# Patient Record
Sex: Female | Born: 1950 | Race: Black or African American | Hispanic: No | Smoking: Never smoker
Health system: Southern US, Community
[De-identification: ages and names within clinical notes are randomized; demographics above are authoritative.]

## PROBLEM LIST (undated history)

## (undated) DIAGNOSIS — I10 Essential (primary) hypertension: Secondary | ICD-10-CM

## (undated) DIAGNOSIS — E042 Nontoxic multinodular goiter: Secondary | ICD-10-CM

## (undated) DIAGNOSIS — H409 Unspecified glaucoma: Secondary | ICD-10-CM

## (undated) DIAGNOSIS — E785 Hyperlipidemia, unspecified: Secondary | ICD-10-CM

## (undated) DIAGNOSIS — R7302 Impaired glucose tolerance (oral): Secondary | ICD-10-CM

## (undated) HISTORY — DX: Nontoxic multinodular goiter: E04.2

## (undated) HISTORY — DX: Unspecified glaucoma: H40.9

## (undated) HISTORY — DX: Essential (primary) hypertension: I10

## (undated) HISTORY — DX: Hyperlipidemia, unspecified: E78.5

## (undated) HISTORY — DX: Impaired glucose tolerance (oral): R73.02

---

## 1988-05-29 DIAGNOSIS — I10 Essential (primary) hypertension: Secondary | ICD-10-CM

## 1988-05-29 HISTORY — DX: Essential (primary) hypertension: I10

## 2000-11-09 ENCOUNTER — Ambulatory Visit (HOSPITAL_COMMUNITY): Admission: RE | Admit: 2000-11-09 | Discharge: 2000-11-09 | Payer: Self-pay | Admitting: Family Medicine

## 2000-11-09 ENCOUNTER — Encounter: Payer: Self-pay | Admitting: Family Medicine

## 2000-12-19 ENCOUNTER — Ambulatory Visit (HOSPITAL_COMMUNITY): Admission: RE | Admit: 2000-12-19 | Discharge: 2000-12-19 | Payer: Self-pay | Admitting: General Surgery

## 2001-11-21 ENCOUNTER — Encounter: Payer: Self-pay | Admitting: Family Medicine

## 2001-11-21 ENCOUNTER — Ambulatory Visit (HOSPITAL_COMMUNITY): Admission: RE | Admit: 2001-11-21 | Discharge: 2001-11-21 | Payer: Self-pay | Admitting: Family Medicine

## 2002-12-03 ENCOUNTER — Ambulatory Visit (HOSPITAL_COMMUNITY): Admission: RE | Admit: 2002-12-03 | Discharge: 2002-12-03 | Payer: Self-pay | Admitting: Family Medicine

## 2002-12-03 ENCOUNTER — Encounter: Payer: Self-pay | Admitting: Family Medicine

## 2004-01-04 ENCOUNTER — Ambulatory Visit (HOSPITAL_COMMUNITY): Admission: RE | Admit: 2004-01-04 | Discharge: 2004-01-04 | Payer: Self-pay | Admitting: Family Medicine

## 2004-01-19 ENCOUNTER — Ambulatory Visit (HOSPITAL_COMMUNITY): Admission: RE | Admit: 2004-01-19 | Discharge: 2004-01-19 | Payer: Self-pay | Admitting: Obstetrics & Gynecology

## 2004-05-16 ENCOUNTER — Ambulatory Visit: Payer: Self-pay | Admitting: Family Medicine

## 2004-07-20 ENCOUNTER — Ambulatory Visit: Payer: Self-pay | Admitting: Family Medicine

## 2005-01-26 ENCOUNTER — Ambulatory Visit (HOSPITAL_COMMUNITY): Admission: RE | Admit: 2005-01-26 | Discharge: 2005-01-26 | Payer: Self-pay | Admitting: Family Medicine

## 2005-02-02 ENCOUNTER — Ambulatory Visit: Payer: Self-pay | Admitting: Family Medicine

## 2005-02-13 ENCOUNTER — Ambulatory Visit (HOSPITAL_COMMUNITY): Admission: RE | Admit: 2005-02-13 | Discharge: 2005-02-13 | Payer: Self-pay | Admitting: Family Medicine

## 2005-08-29 ENCOUNTER — Ambulatory Visit: Payer: Self-pay | Admitting: Family Medicine

## 2005-09-04 ENCOUNTER — Ambulatory Visit (HOSPITAL_COMMUNITY): Admission: RE | Admit: 2005-09-04 | Discharge: 2005-09-04 | Payer: Self-pay | Admitting: Family Medicine

## 2006-02-20 ENCOUNTER — Ambulatory Visit (HOSPITAL_COMMUNITY): Admission: RE | Admit: 2006-02-20 | Discharge: 2006-02-20 | Payer: Self-pay | Admitting: Family Medicine

## 2006-04-18 ENCOUNTER — Ambulatory Visit: Payer: Self-pay | Admitting: Family Medicine

## 2006-06-22 ENCOUNTER — Ambulatory Visit: Payer: Self-pay | Admitting: Gastroenterology

## 2006-06-22 ENCOUNTER — Ambulatory Visit (HOSPITAL_COMMUNITY): Admission: RE | Admit: 2006-06-22 | Discharge: 2006-06-22 | Payer: Self-pay | Admitting: Gastroenterology

## 2006-06-22 LAB — HM COLONOSCOPY: HM Colonoscopy: NORMAL

## 2006-10-12 ENCOUNTER — Ambulatory Visit: Payer: Self-pay | Admitting: Family Medicine

## 2007-03-22 ENCOUNTER — Other Ambulatory Visit: Admission: RE | Admit: 2007-03-22 | Discharge: 2007-03-22 | Payer: Self-pay | Admitting: Obstetrics and Gynecology

## 2007-03-22 ENCOUNTER — Encounter (INDEPENDENT_AMBULATORY_CARE_PROVIDER_SITE_OTHER): Payer: Self-pay | Admitting: *Deleted

## 2007-03-22 ENCOUNTER — Ambulatory Visit (HOSPITAL_COMMUNITY): Admission: RE | Admit: 2007-03-22 | Discharge: 2007-03-22 | Payer: Self-pay | Admitting: Family Medicine

## 2007-03-22 LAB — CONVERTED CEMR LAB: Pap Smear: NORMAL

## 2007-04-19 ENCOUNTER — Encounter: Payer: Self-pay | Admitting: Family Medicine

## 2007-04-19 LAB — CONVERTED CEMR LAB
BUN: 25 mg/dL — ABNORMAL HIGH (ref 6–23)
CO2: 27 meq/L (ref 19–32)
Calcium: 9.5 mg/dL (ref 8.4–10.5)
Chloride: 102 meq/L (ref 96–112)
Cholesterol: 172 mg/dL (ref 0–200)
Creatinine, Ser: 0.99 mg/dL (ref 0.40–1.20)
Glucose, Bld: 97 mg/dL (ref 70–99)
HDL: 36 mg/dL — ABNORMAL LOW (ref >39)
LDL Cholesterol: 107 mg/dL — ABNORMAL HIGH (ref 0–99)
Potassium: 4.1 meq/L (ref 3.5–5.3)
Sodium: 140 meq/L (ref 135–145)
Total CHOL/HDL Ratio: 4.8
Triglycerides: 147 mg/dL (ref <150)
VLDL: 29 mg/dL (ref 0–40)

## 2007-04-24 ENCOUNTER — Ambulatory Visit: Payer: Self-pay | Admitting: Family Medicine

## 2007-04-24 LAB — CONVERTED CEMR LAB
ALT: 19 units/L (ref 0–35)
AST: 19 units/L (ref 0–37)
Albumin: 4.9 g/dL (ref 3.5–5.2)
Alkaline Phosphatase: 71 units/L (ref 39–117)
Bilirubin, Direct: 0.1 mg/dL (ref 0.0–0.3)
Indirect Bilirubin: 0.4 mg/dL (ref 0.0–0.9)
Total Bilirubin: 0.5 mg/dL (ref 0.3–1.2)
Total Protein: 7.8 g/dL (ref 6.0–8.3)

## 2007-05-20 ENCOUNTER — Ambulatory Visit (HOSPITAL_COMMUNITY): Admission: RE | Admit: 2007-05-20 | Discharge: 2007-05-20 | Payer: Self-pay | Admitting: Family Medicine

## 2007-05-30 ENCOUNTER — Encounter: Payer: Self-pay | Admitting: Family Medicine

## 2007-05-31 ENCOUNTER — Ambulatory Visit (HOSPITAL_COMMUNITY): Admission: RE | Admit: 2007-05-31 | Discharge: 2007-05-31 | Payer: Self-pay | Admitting: Family Medicine

## 2007-10-25 ENCOUNTER — Encounter: Payer: Self-pay | Admitting: Family Medicine

## 2007-10-25 LAB — CONVERTED CEMR LAB
ALT: 21 units/L (ref 0–35)
AST: 22 units/L (ref 0–37)
Albumin: 4.7 g/dL (ref 3.5–5.2)
Alkaline Phosphatase: 71 units/L (ref 39–117)
BUN: 24 mg/dL — ABNORMAL HIGH (ref 6–23)
Bilirubin, Direct: 0.1 mg/dL (ref 0.0–0.3)
CO2: 26 meq/L (ref 19–32)
Calcium: 10 mg/dL (ref 8.4–10.5)
Chloride: 104 meq/L (ref 96–112)
Cholesterol: 113 mg/dL (ref 0–200)
Creatinine, Ser: 0.89 mg/dL (ref 0.40–1.20)
Glucose, Bld: 107 mg/dL — ABNORMAL HIGH (ref 70–99)
HDL: 44 mg/dL (ref >39)
Indirect Bilirubin: 0.5 mg/dL (ref 0.0–0.9)
LDL Cholesterol: 53 mg/dL (ref 0–99)
Potassium: 4.7 meq/L (ref 3.5–5.3)
Sodium: 143 meq/L (ref 135–145)
Total Bilirubin: 0.6 mg/dL (ref 0.3–1.2)
Total CHOL/HDL Ratio: 2.6
Total Protein: 7.3 g/dL (ref 6.0–8.3)
Triglycerides: 78 mg/dL (ref <150)
VLDL: 16 mg/dL (ref 0–40)

## 2007-11-06 ENCOUNTER — Encounter (INDEPENDENT_AMBULATORY_CARE_PROVIDER_SITE_OTHER): Payer: Self-pay | Admitting: *Deleted

## 2007-11-06 DIAGNOSIS — I10 Essential (primary) hypertension: Secondary | ICD-10-CM | POA: Insufficient documentation

## 2007-11-06 DIAGNOSIS — E785 Hyperlipidemia, unspecified: Secondary | ICD-10-CM | POA: Insufficient documentation

## 2007-11-06 DIAGNOSIS — E042 Nontoxic multinodular goiter: Secondary | ICD-10-CM | POA: Insufficient documentation

## 2007-11-08 ENCOUNTER — Ambulatory Visit: Payer: Self-pay | Admitting: Family Medicine

## 2008-04-10 ENCOUNTER — Ambulatory Visit (HOSPITAL_COMMUNITY): Admission: RE | Admit: 2008-04-10 | Discharge: 2008-04-10 | Payer: Self-pay | Admitting: Family Medicine

## 2008-05-18 ENCOUNTER — Telehealth: Payer: Self-pay | Admitting: Family Medicine

## 2008-05-18 ENCOUNTER — Encounter: Payer: Self-pay | Admitting: Family Medicine

## 2008-05-18 ENCOUNTER — Other Ambulatory Visit: Admission: RE | Admit: 2008-05-18 | Discharge: 2008-05-18 | Payer: Self-pay | Admitting: Obstetrics & Gynecology

## 2008-05-18 LAB — CONVERTED CEMR LAB
ALT: 34 units/L (ref 0–35)
AST: 33 units/L (ref 0–37)
Albumin: 4.8 g/dL (ref 3.5–5.2)
Alkaline Phosphatase: 69 units/L (ref 39–117)
BUN: 22 mg/dL (ref 6–23)
Bilirubin, Direct: 0.1 mg/dL (ref 0.0–0.3)
CO2: 28 meq/L (ref 19–32)
Calcium: 9.9 mg/dL (ref 8.4–10.5)
Chloride: 102 meq/L (ref 96–112)
Cholesterol: 148 mg/dL (ref 0–200)
Creatinine, Ser: 0.88 mg/dL (ref 0.40–1.20)
Glucose, Bld: 98 mg/dL (ref 70–99)
HDL: 41 mg/dL (ref >39)
Indirect Bilirubin: 0.4 mg/dL (ref 0.0–0.9)
LDL Cholesterol: 78 mg/dL (ref 0–99)
Potassium: 4.2 meq/L (ref 3.5–5.3)
Sodium: 141 meq/L (ref 135–145)
Total Bilirubin: 0.5 mg/dL (ref 0.3–1.2)
Total CHOL/HDL Ratio: 3.6
Total Protein: 7.5 g/dL (ref 6.0–8.3)
Triglycerides: 147 mg/dL (ref <150)
VLDL: 29 mg/dL (ref 0–40)

## 2008-05-26 ENCOUNTER — Ambulatory Visit: Payer: Self-pay | Admitting: Family Medicine

## 2008-05-29 DIAGNOSIS — E042 Nontoxic multinodular goiter: Secondary | ICD-10-CM

## 2008-05-29 DIAGNOSIS — E785 Hyperlipidemia, unspecified: Secondary | ICD-10-CM

## 2008-05-29 HISTORY — DX: Hyperlipidemia, unspecified: E78.5

## 2008-05-29 HISTORY — DX: Nontoxic multinodular goiter: E04.2

## 2008-12-01 ENCOUNTER — Telehealth: Payer: Self-pay | Admitting: Family Medicine

## 2008-12-02 LAB — CONVERTED CEMR LAB
ALT: 30 units/L (ref 0–35)
AST: 33 units/L (ref 0–37)
Albumin: 4.7 g/dL (ref 3.5–5.2)
Alkaline Phosphatase: 68 units/L (ref 39–117)
BUN: 24 mg/dL — ABNORMAL HIGH (ref 6–23)
Bilirubin, Direct: 0.1 mg/dL (ref 0.0–0.3)
CO2: 28 meq/L (ref 19–32)
Calcium: 9.9 mg/dL (ref 8.4–10.5)
Chloride: 103 meq/L (ref 96–112)
Cholesterol: 123 mg/dL (ref 0–200)
Creatinine, Ser: 0.87 mg/dL (ref 0.40–1.20)
Glucose, Bld: 100 mg/dL — ABNORMAL HIGH (ref 70–99)
HDL: 42 mg/dL (ref >39)
Indirect Bilirubin: 0.6 mg/dL (ref 0.0–0.9)
LDL Cholesterol: 61 mg/dL (ref 0–99)
Potassium: 3.9 meq/L (ref 3.5–5.3)
Sodium: 143 meq/L (ref 135–145)
Total Bilirubin: 0.7 mg/dL (ref 0.3–1.2)
Total CHOL/HDL Ratio: 2.9
Total Protein: 7.4 g/dL (ref 6.0–8.3)
Triglycerides: 100 mg/dL (ref <150)
VLDL: 20 mg/dL (ref 0–40)

## 2008-12-07 ENCOUNTER — Ambulatory Visit: Payer: Self-pay | Admitting: Family Medicine

## 2008-12-07 DIAGNOSIS — F4321 Adjustment disorder with depressed mood: Secondary | ICD-10-CM | POA: Insufficient documentation

## 2008-12-08 LAB — CONVERTED CEMR LAB
Basophils Absolute: 0 10*3/uL (ref 0.0–0.1)
Basophils Relative: 0 % (ref 0–1)
Eosinophils Absolute: 0.1 10*3/uL (ref 0.0–0.7)
Eosinophils Relative: 1 % (ref 0–5)
HCT: 41.7 % (ref 36.0–46.0)
Hemoglobin: 14.4 g/dL (ref 12.0–15.0)
Lymphocytes Relative: 14 % (ref 12–46)
Lymphs Abs: 1 10*3/uL (ref 0.7–4.0)
MCHC: 34.5 g/dL (ref 30.0–36.0)
MCV: 83.9 fL (ref 78.0–100.0)
Monocytes Absolute: 0.4 10*3/uL (ref 0.1–1.0)
Monocytes Relative: 6 % (ref 3–12)
Neutro Abs: 5.6 10*3/uL (ref 1.7–7.7)
Neutrophils Relative %: 79 % — ABNORMAL HIGH (ref 43–77)
Platelets: 348 10*3/uL (ref 150–400)
RBC: 4.97 M/uL (ref 3.87–5.11)
RDW: 13.4 % (ref 11.5–15.5)
TSH: 1.152 microintl units/mL (ref 0.350–4.500)
WBC: 7.1 10*3/uL (ref 4.0–10.5)

## 2008-12-22 ENCOUNTER — Ambulatory Visit (HOSPITAL_COMMUNITY): Admission: RE | Admit: 2008-12-22 | Discharge: 2008-12-22 | Payer: Self-pay | Admitting: Family Medicine

## 2009-04-21 ENCOUNTER — Ambulatory Visit (HOSPITAL_COMMUNITY): Admission: RE | Admit: 2009-04-21 | Discharge: 2009-04-21 | Payer: Self-pay | Admitting: Family Medicine

## 2009-05-14 ENCOUNTER — Encounter: Payer: Self-pay | Admitting: Family Medicine

## 2009-05-17 ENCOUNTER — Telehealth: Payer: Self-pay | Admitting: Family Medicine

## 2009-05-20 LAB — CONVERTED CEMR LAB
ALT: 27 units/L (ref 0–35)
AST: 33 units/L (ref 0–37)
Albumin: 4.6 g/dL (ref 3.5–5.2)
Alkaline Phosphatase: 65 units/L (ref 39–117)
BUN: 19 mg/dL (ref 6–23)
Bilirubin, Direct: 0.1 mg/dL (ref 0.0–0.3)
CO2: 24 meq/L (ref 19–32)
Calcium: 10.2 mg/dL (ref 8.4–10.5)
Chloride: 104 meq/L (ref 96–112)
Cholesterol: 132 mg/dL (ref 0–200)
Creatinine, Ser: 0.92 mg/dL (ref 0.40–1.20)
Glucose, Bld: 101 mg/dL — ABNORMAL HIGH (ref 70–99)
HDL: 39 mg/dL — ABNORMAL LOW (ref >39)
Indirect Bilirubin: 0.5 mg/dL (ref 0.0–0.9)
LDL Cholesterol: 68 mg/dL (ref 0–99)
Potassium: 4 meq/L (ref 3.5–5.3)
Sodium: 142 meq/L (ref 135–145)
Total Bilirubin: 0.6 mg/dL (ref 0.3–1.2)
Total CHOL/HDL Ratio: 3.4
Total Protein: 7.4 g/dL (ref 6.0–8.3)
Triglycerides: 126 mg/dL (ref <150)
VLDL: 25 mg/dL (ref 0–40)

## 2009-05-27 ENCOUNTER — Ambulatory Visit: Payer: Self-pay | Admitting: Family Medicine

## 2009-05-27 DIAGNOSIS — M949 Disorder of cartilage, unspecified: Secondary | ICD-10-CM

## 2009-05-27 DIAGNOSIS — E559 Vitamin D deficiency, unspecified: Secondary | ICD-10-CM | POA: Insufficient documentation

## 2009-05-27 DIAGNOSIS — M899 Disorder of bone, unspecified: Secondary | ICD-10-CM | POA: Insufficient documentation

## 2009-05-27 DIAGNOSIS — E739 Lactose intolerance, unspecified: Secondary | ICD-10-CM | POA: Insufficient documentation

## 2009-05-27 LAB — CONVERTED CEMR LAB: Hgb A1c MFr Bld: 5.7 %

## 2009-06-01 ENCOUNTER — Other Ambulatory Visit: Admission: RE | Admit: 2009-06-01 | Discharge: 2009-06-01 | Payer: Self-pay | Admitting: Obstetrics & Gynecology

## 2009-07-12 ENCOUNTER — Ambulatory Visit: Payer: Self-pay | Admitting: Family Medicine

## 2009-11-08 LAB — CONVERTED CEMR LAB
ALT: 24 units/L (ref 0–35)
AST: 28 units/L (ref 0–37)
Albumin: 4.5 g/dL (ref 3.5–5.2)
Alkaline Phosphatase: 68 units/L (ref 39–117)
BUN: 27 mg/dL — ABNORMAL HIGH (ref 6–23)
Bilirubin, Direct: 0.1 mg/dL (ref 0.0–0.3)
CO2: 27 meq/L (ref 19–32)
Calcium: 9.3 mg/dL (ref 8.4–10.5)
Chloride: 103 meq/L (ref 96–112)
Cholesterol: 114 mg/dL (ref 0–200)
Creatinine, Ser: 0.95 mg/dL (ref 0.40–1.20)
Glucose, Bld: 106 mg/dL — ABNORMAL HIGH (ref 70–99)
HDL: 39 mg/dL — ABNORMAL LOW (ref >39)
Indirect Bilirubin: 0.4 mg/dL (ref 0.0–0.9)
LDL Cholesterol: 56 mg/dL (ref 0–99)
Potassium: 4.2 meq/L (ref 3.5–5.3)
Sodium: 141 meq/L (ref 135–145)
Total Bilirubin: 0.5 mg/dL (ref 0.3–1.2)
Total CHOL/HDL Ratio: 2.9
Total Protein: 7.2 g/dL (ref 6.0–8.3)
Triglycerides: 97 mg/dL (ref <150)
VLDL: 19 mg/dL (ref 0–40)
Vit D, 25-Hydroxy: 30 ng/mL (ref 30–89)

## 2009-11-10 ENCOUNTER — Ambulatory Visit: Payer: Self-pay | Admitting: Family Medicine

## 2009-11-10 DIAGNOSIS — R5381 Other malaise: Secondary | ICD-10-CM | POA: Insufficient documentation

## 2009-11-10 DIAGNOSIS — R7301 Impaired fasting glucose: Secondary | ICD-10-CM | POA: Insufficient documentation

## 2009-11-10 DIAGNOSIS — R5383 Other fatigue: Secondary | ICD-10-CM

## 2010-03-11 LAB — CONVERTED CEMR LAB
ALT: 19 units/L (ref 0–35)
AST: 21 units/L (ref 0–37)
Albumin: 4.6 g/dL (ref 3.5–5.2)
Alkaline Phosphatase: 65 units/L (ref 39–117)
BUN: 17 mg/dL (ref 6–23)
Basophils Absolute: 0.1 10*3/uL (ref 0.0–0.1)
Basophils Relative: 1 % (ref 0–1)
Bilirubin, Direct: 0.2 mg/dL (ref 0.0–0.3)
CO2: 28 meq/L (ref 19–32)
Calcium: 9.6 mg/dL (ref 8.4–10.5)
Chloride: 102 meq/L (ref 96–112)
Cholesterol: 116 mg/dL (ref 0–200)
Creatinine, Ser: 0.95 mg/dL (ref 0.40–1.20)
Eosinophils Absolute: 0.2 10*3/uL (ref 0.0–0.7)
Eosinophils Relative: 3 % (ref 0–5)
Glucose, Bld: 91 mg/dL (ref 70–99)
HCT: 42.2 % (ref 36.0–46.0)
HDL: 37 mg/dL — ABNORMAL LOW (ref >39)
Hemoglobin: 14.3 g/dL (ref 12.0–15.0)
Hgb A1c MFr Bld: 5.9 % — ABNORMAL HIGH (ref <5.7)
Indirect Bilirubin: 0.5 mg/dL (ref 0.0–0.9)
LDL Cholesterol: 61 mg/dL (ref 0–99)
Lymphocytes Relative: 24 % (ref 12–46)
Lymphs Abs: 1.8 10*3/uL (ref 0.7–4.0)
MCHC: 33.9 g/dL (ref 30.0–36.0)
MCV: 85.8 fL (ref 78.0–100.0)
Monocytes Absolute: 0.6 10*3/uL (ref 0.1–1.0)
Monocytes Relative: 8 % (ref 3–12)
Neutro Abs: 4.6 10*3/uL (ref 1.7–7.7)
Neutrophils Relative %: 64 % (ref 43–77)
Platelets: 316 10*3/uL (ref 150–400)
Potassium: 4 meq/L (ref 3.5–5.3)
RBC: 4.92 M/uL (ref 3.87–5.11)
RDW: 14.2 % (ref 11.5–15.5)
Sodium: 142 meq/L (ref 135–145)
Total Bilirubin: 0.7 mg/dL (ref 0.3–1.2)
Total CHOL/HDL Ratio: 3.1
Total Protein: 7 g/dL (ref 6.0–8.3)
Triglycerides: 89 mg/dL (ref <150)
VLDL: 18 mg/dL (ref 0–40)
WBC: 7.2 10*3/uL (ref 4.0–10.5)

## 2010-03-16 ENCOUNTER — Ambulatory Visit: Payer: Self-pay | Admitting: Family Medicine

## 2010-04-27 ENCOUNTER — Ambulatory Visit: Payer: Self-pay | Admitting: Family Medicine

## 2010-04-29 ENCOUNTER — Ambulatory Visit (HOSPITAL_COMMUNITY): Admission: RE | Admit: 2010-04-29 | Discharge: 2010-04-29 | Payer: Self-pay | Admitting: Family Medicine

## 2010-05-05 ENCOUNTER — Encounter: Payer: Self-pay | Admitting: Family Medicine

## 2010-05-06 ENCOUNTER — Encounter: Payer: Self-pay | Admitting: Family Medicine

## 2010-05-11 ENCOUNTER — Ambulatory Visit (HOSPITAL_COMMUNITY)
Admission: RE | Admit: 2010-05-11 | Discharge: 2010-05-11 | Payer: Self-pay | Source: Home / Self Care | Attending: Family Medicine | Admitting: Family Medicine

## 2010-05-11 DIAGNOSIS — R7302 Impaired glucose tolerance (oral): Secondary | ICD-10-CM

## 2010-05-11 HISTORY — DX: Impaired glucose tolerance (oral): R73.02

## 2010-06-28 NOTE — Letter (Signed)
[facility]   Imported By: Lind Guest 05/06/2010 13:19:28  _____________________________________________________________________  External Attachment:    Type:   Image     Comment:   External Document

## 2010-06-28 NOTE — Assessment & Plan Note (Signed)
Summary: office visit   Vital Signs:  Patient profile:   60 year old female Menstrual status:  postmenopausal Height:      68 inches Weight:      161 pounds BMI:     24.57 O2 Sat:      96 % on Room air Pulse rate:   112 / minute Pulse rhythm:   regular Resp:     16 per minute BP sitting:   140 / 84  (left arm)  Vitals Entered By: Mauricia Area CMA (March 16, 2010 8:35 AM)  O2 Flow:  Room air CC: follow up   Primary Care Provider:  Syliva Overman MD  CC:  follow up.  History of Present Illness: Reports  Ardeth Sportsman has been doing well. Denies recent fever or chills. Denies sinus pressure, nasal congestion , ear pain or sore throat. Denies chest congestion, or cough productive of sputum.Has somewhat of a tickle and dry cough often, may be ACE related Denies chest pain, palpitations, PND, orthopnea or leg swelling. Denies abdominal pain, nausea, vomitting, diarrhea or constipation. Denies change in bowel movements or bloody stool. Denies dysuria , frequency, incontinence or hesitancy. Denies  joint pain, swelling, or reduced mobility. Denies headaches, vertigo, seizures. Denies depression,or insomnia. The pt exhibits anxiety at all times when she comes for her appts, reports "white coat HTn", she actually has her metyer whih is showing even higher readings than i am currently getting. She is on 3 different drugs, and this can clearly be simplified Denies  rash, lesions, or itch.     Current Medications (verified): 1)  Diovan Hct 160-25 Mg  Tabs (Valsartan-Hydrochlorothiazide) .... Take 1 Tablet By Mouth Once A Day 2)  Enalapril Maleate 20 Mg  Tabs (Enalapril Maleate) .... Take Two Tablets By Mouth Daily 3)  Hydrochlorothiazide 25 Mg  Tabs (Hydrochlorothiazide) .... Take 1 Tablet By Mouth Once A Day 4)  Crestor 5 Mg Tabs (Rosuvastatin Calcium) .... Take 1 Tab By Mouth Four Times A Week 5)  Adult Aspirin Ec Low Strength 81 Mg Tbec (Aspirin) .... Take 1 Tablet By Mouth Once A  Day 6)  Oscal 500/200 D-3 500-200 Mg-Unit Tabs (Calcium-Vitamin D) .... Take 1 Tablet By Mouth Three Times A Day 7)  Flonase 50 Mcg/act Susp (Fluticasone Propionate) .Marland Kitchen.. 1 To 2 Puffs Per Nostril Daily As Needed For Nasal Congestion and Uncontrolled Allergies  Allergies (verified): No Known Drug Allergies  Review of Systems      See HPI Eyes:  Complains of vision loss-both eyes; denies blurring and discharge. Endo:  Denies cold intolerance, excessive thirst, excessive urination, and heat intolerance. Heme:  Denies abnormal bruising and bleeding. Allergy:  Complains of seasonal allergies; denies hives or rash and itching eyes.  Physical Exam  General:  Well-developed,well-nourished,in no acute distress; alert,appropriate and cooperative throughout examination HEENT: No facial asymmetry,  EOMI, No sinus tenderness, TM's Clear, oropharynx  pink and moist.   Chest: Clear to auscultation bilaterally.  CVS: S1, S2, No murmurs, No S3.   Abd: Soft, Nontender.  MS: Adequate ROM spine, hips, shoulders and knees.  Ext: No edema.   CNS: CN 2-12 intact, power tone and sensation normal throughout.   Skin: Intact, no visible lesions or rashes.  Psych: Good eye contact, normal affect.  Memory intact, not anxious or depressed appearing.    Impression & Recommendations:  Problem # 1:  IMPAIRED FASTING GLUCOSE (ICD-790.21) Assessment Deteriorated hbA1c IS 5.9 , WHICH IS INCREASED Pt advised to reduce carbohydrate intake, espescially sweets,  and to start regular physical activity, at least 30 minutes 5 days weekly, to enable weight loss, and reduce the risk of becoming diabetic   Problem # 2:  HYPERLIPIDEMIA (ICD-272.4) Assessment: Unchanged  Her updated medication list for this problem includes:    Crestor 5 Mg Tabs (Rosuvastatin calcium) .Marland Kitchen... Take 1 tab by mouth four times a week Low fat dietdiscussed and encouraged  Labs Reviewed: SGOT: 21 (03/11/2010)   SGPT: 19 (03/11/2010)   HDL:37  (03/11/2010), 39 (44/07/4740)  LDL:61 (03/11/2010), 56 (11/05/2009)  Chol:116 (03/11/2010), 114 (11/05/2009)  Trig:89 (03/11/2010), 97 (11/05/2009)  Problem # 3:  HYPERTENSION (ICD-401.9) Assessment: Deteriorated  The following medications were removed from the medication list:    Diovan Hct 160-25 Mg Tabs (Valsartan-hydrochlorothiazide) .Marland Kitchen... Take 1 tablet by mouth once a day    Enalapril Maleate 20 Mg Tabs (Enalapril maleate) .Marland Kitchen... Take two tablets by mouth daily, she was actually taking 1    Hydrochlorothiazide 25 Mg Tabs (Hydrochlorothiazide) .Marland Kitchen... Take 1 tablet by mouth once a day Her updated medication list for this problem includes:    Diovan Hct 320-25 Mg Tabs (Valsartan-hydrochlorothiazide) .Marland Kitchen... Take 1 tablet by mouth once a day Patient advised to follow low sodium diet rich in fruit and vegetables, and to commit to at least 30 minutes 5 days per week of regular exercise , to improve blood presure control.   BP today: 140/84 Prior BP: 124/80 (11/10/2009)  Labs Reviewed: K+: 4.0 (03/11/2010) Creat: : 0.95 (03/11/2010)   Chol: 116 (03/11/2010)   HDL: 37 (03/11/2010)   LDL: 61 (03/11/2010)   TG: 89 (03/11/2010)  Complete Medication List: 1)  Crestor 5 Mg Tabs (Rosuvastatin calcium) .... Take 1 tab by mouth four times a week 2)  Adult Aspirin Ec Low Strength 81 Mg Tbec (Aspirin) .... Take 1 tablet by mouth once a day 3)  Flonase 50 Mcg/act Susp (Fluticasone propionate) .Marland Kitchen.. 1 to 2 puffs per nostril daily as needed for nasal congestion and uncontrolled allergies 4)  Calcium/d3 1200mg /1000iu  .... Take 1 capsule by mouth once a day 5)  Diovan Hct 320-25 Mg Tabs (Valsartan-hydrochlorothiazide) .... Take 1 tablet by mouth once a day 6)  Centrum Silver Ultra Womens Tabs (Multiple vitamins-minerals) .... Take 1 tablet by mouth once a day  Other Orders: Influenza Vaccine NON MCR (59563)  Patient Instructions: 1)  Follow up appointment in 6 weeks 2)  PLS take only diovan/hctz 320/25  one daily for bP, this is NEW, stop the old meds pls 3)  The medication list was reviewed and reconciled..All changed/newly prescribed medications were explained. A complete medication list was provided to the patient/caregiver.  4)  It is important that you exercise regularly at least 20 minutes 5 times a week. If you develop chest pain, have severe difficulty breathing, or feel very tired , stop exercising immediately and seek medical attention. 5)  You need to lose weight. Consider a lower calorie diet and regular exercise.  Prescriptions: DIOVAN HCT 320-25 MG TABS (VALSARTAN-HYDROCHLOROTHIAZIDE) Take 1 tablet by mouth once a day  #30 x 3   Entered and Authorized by:   Syliva Overman MD   Signed by:   Syliva Overman MD on 03/16/2010   Method used:   Print then Give to Patient   RxID:   [id number]    Orders Added: 1)  Est. Patient Level IV [60630] 2)  Influenza Vaccine NON MCR [00028]   Immunizations Administered:  Influenza Vaccine # 1:    Vaccine Type: Fluvax Non-MCR  Site: right deltoid    Mfr: novartis    Dose: 0.5 ml    Route: IM    Given by: Adella Hare LPN    Exp. Date: 09/2010    Lot #: [id number]    VIS given: 12/21/09 version given March 16, 2010.   Immunizations Administered:  Influenza Vaccine # 1:    Vaccine Type: Fluvax Non-MCR    Site: right deltoid    Mfr: novartis    Dose: 0.5 ml    Route: IM    Given by: Adella Hare LPN    Exp. Date: 09/2010    Lot #: [id number]    VIS given: 12/21/09 version given March 16, 2010.

## 2010-06-28 NOTE — Letter (Signed)
Summary: Historic history & physical  Historic history & physical   Imported By: Lind Guest 10/06/2009 07:54:15  _____________________________________________________________________  External Attachment:    Type:   Image     Comment:   External Document

## 2010-06-28 NOTE — Letter (Signed)
Summary: Historic misc  Historic misc   Imported By: Lind Guest 10/06/2009 07:53:42  _____________________________________________________________________  External Attachment:    Type:   Image     Comment:   External Document

## 2010-06-28 NOTE — Assessment & Plan Note (Signed)
Summary: [facility]   Vital Signs:  Patient profile:   60 year old female Menstrual status:  postmenopausal Height:      68 inches Weight:      163.75 pounds BMI:     24.99 O2 Sat:      97 % on Room air Pulse rate:   114 / minute Pulse rhythm:   regular Resp:     16 per minute BP sitting:   130 / 90  (left arm)  Vitals Entered By: Adella Hare LPN (April 27, 2010 8:43 AM)  O2 Flow:  Room air CC: follow-up visit Is Patient Diabetic? No Pain Assessment Patient in pain? no        Primary Care Provider:  Syliva Overman MD  CC:  follow-up visit.  History of Present Illness: Reports  that she has been doing well. she denies any adverse side effects from her med, and is happy with her home blood pressures.  Denies recent fever or chills. Denies sinus pressure, nasal congestion , ear pain or sore throat. Denies chest congestion, or cough productive of sputum. Denies chest pain, palpitations, PND, orthopnea or leg swelling. Denies abdominal pain, nausea, vomitting, diarrhea or constipation. Denies change in bowel movements or bloody stool. Denies dysuria , frequency, incontinence or hesitancy. Denies  joint pain, swelling, or reduced mobility. Denies headaches, vertigo, seizures. Denies depression, anxiety or insomnia. Denies  rash, lesions, or itch.     Current Medications (verified): 1)  Crestor 5 Mg Tabs (Rosuvastatin Calcium) .... Take 1 Tab By Mouth Four Times A Week 2)  Adult Aspirin Ec Low Strength 81 Mg Tbec (Aspirin) .... Take 1 Tablet By Mouth Once A Day 3)  Flonase 50 Mcg/act Susp (Fluticasone Propionate) .Marland Kitchen.. 1 To 2 Puffs Per Nostril Daily As Needed For Nasal Congestion and Uncontrolled Allergies 4)  Calcium/d3  1200mg /1000iu .... Take 1 Capsule By Mouth Once A Day 5)  Diovan Hct 320-25 Mg Tabs (Valsartan-Hydrochlorothiazide) .... Take 1 Tablet By Mouth Once A Day 6)  Centrum Silver Ultra Womens  Tabs (Multiple Vitamins-Minerals) .... Take 1 Tablet By Mouth  Once A Day  Allergies (verified): No Known Drug Allergies  Review of Systems      See HPI Eyes:  Complains of vision loss-both eyes; denies blurring, discharge, eye pain, and red eye. Endo:  Denies cold intolerance, excessive hunger, excessive thirst, and excessive urination. Heme:  Denies abnormal bruising and bleeding. Allergy:  Complains of seasonal allergies; denies hives or rash and itching eyes.  Physical Exam  General:  Well-developed,well-nourished,in no acute distress; alert,appropriate and cooperative throughout examination HEENT: No facial asymmetry,  EOMI, No sinus tenderness, TM's Clear, oropharynx  pink and moist.   Chest: Clear to auscultation bilaterally.  CVS: S1, S2, No murmurs, No S3.   Abd: Soft, Nontender.  MS: Adequate ROM spine, hips, shoulders and knees.  Ext: No edema.   CNS: CN 2-12 intact, power tone and sensation normal throughout.   Skin: Intact, no visible lesions or rashes.  Psych: Good eye contact, normal affect.  Memory intact, not anxious or depressed appearing.    Impression & Recommendations:  Problem # 1:  HYPERTENSION (ICD-401.9) Assessment Improved  Her updated medication list for this problem includes:    Diovan Hct 320-25 Mg Tabs (Valsartan-hydrochlorothiazide) .Marland Kitchen... Take 1 tablet by mouth once a day Patient advised to follow low sodium diet rich in fruit and vegetables, and to commit to at least 30 minutes 5 days per week of regular exercise , to  improve blood presure control.   BP today: 130/90 Prior BP: 140/84 (03/16/2010)  Labs Reviewed: K+: 4.0 (03/11/2010) Creat: : 0.95 (03/11/2010)   Chol: 116 (03/11/2010)   HDL: 37 (03/11/2010)   LDL: 61 (03/11/2010)   TG: 89 (03/11/2010)  Problem # 2:  HYPERLIPIDEMIA (ICD-272.4) Assessment: Unchanged  Her updated medication list for this problem includes:    Crestor 5 Mg Tabs (Rosuvastatin calcium) .Marland Kitchen... Take 1 tab by mouth four times a week Low fat dietdiscussed and encouraged  Labs  Reviewed: SGOT: 21 (03/11/2010)   SGPT: 19 (03/11/2010)   HDL:37 (03/11/2010), 39 (11/05/2009)  LDL:61 (03/11/2010), 56 (11/05/2009)  Chol:116 (03/11/2010), 114 (11/05/2009)  Trig:89 (03/11/2010), 97 (11/05/2009)  Complete Medication List: 1)  Crestor 5 Mg Tabs (Rosuvastatin calcium) .... Take 1 tab by mouth four times a week 2)  Adult Aspirin Ec Low Strength 81 Mg Tbec (Aspirin) .... Take 1 tablet by mouth once a day 3)  Flonase 50 Mcg/act Susp (Fluticasone propionate) .Marland Kitchen.. 1 to 2 puffs per nostril daily as needed for nasal congestion and uncontrolled allergies 4)  Calcium/d3 1200mg /1000iu  .... Take 1 capsule by mouth once a day 5)  Diovan Hct 320-25 Mg Tabs (Valsartan-hydrochlorothiazide) .... Take 1 tablet by mouth once a day 6)  Centrum Silver Ultra Womens Tabs (Multiple vitamins-minerals) .... Take 1 tablet by mouth once a day  Patient Instructions: 1)  Please schedule a follow-up appointment in 4 months. 2)  It is important that you exercise regularly at least 20 minutes 5 times a week. If you develop chest pain, have severe difficulty breathing, or feel very tired , stop exercising immediately and seek medical attention. 3)  pls eat a diet rich in fruit and vegetable and low in salt. 4)  bP today is 130/92 Prescriptions: CRESTOR 5 MG TABS (ROSUVASTATIN CALCIUM) Take 1 tab by mouth four times a week  #30 Tablet x 3   Entered by:   Adella Hare LPN   Authorized by:   Syliva Overman MD   Signed by:   Adella Hare LPN on 16/02/9603   Method used:   Electronically to        [facility]  [address] [address] [id number] (retail)       [address]       [place], [place]  [address]       Ph: [phone] or [phone]       Fax: [phone]   RxID:   [id number]    Orders Added: 1)  Est. Patient Level III [40347]

## 2010-06-28 NOTE — Letter (Signed)
Summary: Historic  x rays  Historic  x rays   Imported By: Lind Guest 10/06/2009 07:54:48  _____________________________________________________________________  External Attachment:    Type:   Image     Comment:   External Document

## 2010-06-28 NOTE — Progress Notes (Signed)
Summary: lab order  Phone Note Call from Patient   Summary of Call: needs a order for lab work. [phone] Initial call taken by: Rudene Anda,  May 17, 2009 8:23 AM  Follow-up for Phone Call        Patient wants lab order. What does she need? Last labs done in July  Follow-up by: Everitt Amber,  May 17, 2009 8:32 AM  Additional Follow-up for Phone Call Additional follow up Details #1::        fasting chem 7 , lipid and hepatic Additional Follow-up by: Syliva Overman MD,  May 17, 2009 8:43 AM    Additional Follow-up for Phone Call Additional follow up Details #2::    Faxed in and left msg for pt to call back so I could let her know Follow-up by: Everitt Amber,  May 17, 2009 10:59 AM

## 2010-06-28 NOTE — Assessment & Plan Note (Signed)
Summary: office visit   Vital Signs:  Patient Profile:   60 Years Old Female Height:     68 inches (172.72 cm) Weight:      164.50 pounds (74.77 kg) BMI:     25.10 BSA:     1.88 O2 Sat:      98 % Pulse rate:   80 / minute Resp:     14 per minute BP sitting:   120 / 80  (left arm)  Pt. in pain?   no  Vitals Entered By: Everitt Amber (May 26, 2008 8:57 AM)                  Chief Complaint:  follow up and lab review.  History of Present Illness: Pt. is doing very well with no new health concerns. She is up to date on all screening tests.  She denies any recent fever or chills. She denies depression, anxiety or insomnia.  She is not as diligent with exercise as she would wish, but plans to change this.    Updated Prior Medication List: DIOVAN HCT 160-25 MG  TABS (VALSARTAN-HYDROCHLOROTHIAZIDE) Take 1 tablet by mouth once a day ENALAPRIL MALEATE 20 MG  TABS (ENALAPRIL MALEATE) Take two tablets by mouth daily HYDROCHLOROTHIAZIDE 25 MG  TABS (HYDROCHLOROTHIAZIDE) Take 1 tablet by mouth once a day CRESTOR 5 MG TABS (ROSUVASTATIN CALCIUM) Take 1 tab by mouth at bedtime ADULT ASPIRIN EC LOW STRENGTH 81 MG TBEC (ASPIRIN) Take 1 tablet by mouth once a day  Current Allergies: No known allergies   Past Surgical History:    none   Family History:    FATHER deceased at age 80 , dementia    MOTHER  deceased at age 15, dementia    FIVE SISTERS  LIVING  / ONE DIABETIC, [facility]    THREE BROTHERS LIVING / ONE DIABETIC  Social History:    EMPLOYED    Married    Never Smoked    Alcohol use-no    Drug use-no    Three healthy adult children    Review of Systems  General      Denies chills, fever, sweats, and weakness.  Eyes      Denies blurring, discharge, double vision, and red eye.  ENT      Denies earache, hoarseness, nasal congestion, sinus pressure, and sore throat.  CV      Denies chest pain or discomfort, palpitations, shortness of breath with  exertion, swelling of feet, and swelling of hands.  Resp      Denies cough, sputum productive, and wheezing.  GI      Denies abdominal pain, constipation, diarrhea, nausea, and vomiting.  GU      Denies dysuria, incontinence, urinary frequency, and urinary hesitancy.  MS      Denies joint pain, cramps, muscle weakness, and stiffness.  Derm      Denies dryness, itching, lesion(s), and rash.  Neuro      Denies headaches, poor balance, and seizures.  Psych      Denies anxiety and depression.  Endo      Denies cold intolerance, excessive hunger, excessive thirst, excessive urination, heat intolerance, polyuria, and weight change.  Heme      Denies abnormal bruising, bleeding, and pallor.   Physical Exam  General:     alert, well-developed, and well-hydrated.   Head:     Normocephalic and atraumatic without obvious abnormalities. No apparent alopecia or balding. Eyes:     vision grossly intact.  Ears:     External ear exam shows no significant lesions or deformities.  Otoscopic examination reveals clear canals, tympanic membranes are intact bilaterally without bulging, retraction, inflammation or discharge. Hearing is grossly normal bilaterally. Nose:     no external deformity and no nasal discharge.   Mouth:     Oral mucosa and oropharynx without lesions or exudates.  Teeth in good repair. Neck:     No deformities, masses, or tenderness noted. Lungs:     Normal respiratory effort, chest expands symmetrically. Lungs are clear to auscultation, no crackles or wheezes. Heart:     Normal rate and regular rhythm. S1 and S2 normal without gallop, murmur, click, rub or other extra sounds. Abdomen:     soft, non-tender, and normal bowel sounds.   Extremities:     No clubbing, cyanosis, edema, or deformity noted with normal full range of motion of all joints.   Neurologic:     alert & oriented X3, cranial nerves II-XII intact, strength normal in all extremities, and  sensation intact to light touch.   Skin:     Intact without suspicious lesions or rashes Cervical Nodes:     No lymphadenopathy noted Psych:     Cognition and judgment appear intact. Alert and cooperative with normal attention span and concentration. No apparent delusions, illusions, hallucinations    Impression & Recommendations:  Problem # 1:  GOITER, MULTINODULAR (ICD-241.1) Assessment: Comment Only  Orders: T-TSH ([id number]),OZHYQM thyroid US is unchanged from previous, pt is reassured.   Problem # 2:  HYPERLIPIDEMIA (ICD-272.4) Assessment: Unchanged  Her updated medication list for this problem includes:    Crestor 5 Mg Tabs (Rosuvastatin calcium) .Marland Kitchen... Take 1 tab by mouth at bedtime  Orders: T-Hepatic Function [id number]) T-Lipid Profile [id number])  Labs Reviewed: Chol: 148 (05/18/2008)   HDL: 41 (05/18/2008)   LDL: 78 (05/18/2008)   TG: 147 (05/18/2008) SGOT: 33 (05/18/2008)   SGPT: 34 (05/18/2008)   Problem # 3:  HYPERTENSION (ICD-401.9) Assessment: Improved  Her updated medication list for this problem includes:    Diovan Hct 160-25 Mg Tabs (Valsartan-hydrochlorothiazide) .Marland Kitchen... Take 1 tablet by mouth once a day    Enalapril Maleate 20 Mg Tabs (Enalapril maleate) .Marland Kitchen... Take two tablets by mouth daily    Hydrochlorothiazide 25 Mg Tabs (Hydrochlorothiazide) .Marland Kitchen... Take 1 tablet by mouth once a day  Orders: T-Basic Metabolic Panel [id number])  BP today: 120/80  Labs Reviewed: Creat: 0.88 (05/18/2008) Chol: 148 (05/18/2008)   HDL: 41 (05/18/2008)   LDL: 78 (05/18/2008)   TG: 147 (05/18/2008)   Complete Medication List: 1)  Diovan Hct 160-25 Mg Tabs (Valsartan-hydrochlorothiazide) .... Take 1 tablet by mouth once a day 2)  Enalapril Maleate 20 Mg Tabs (Enalapril maleate) .... Take two tablets by mouth daily 3)  Hydrochlorothiazide 25 Mg Tabs (Hydrochlorothiazide) .... Take 1 tablet by mouth once a day 4)  Crestor 5 Mg Tabs (Rosuvastatin calcium) ....  Take 1 tab by mouth at bedtime 5)  Adult Aspirin Ec Low Strength 81 Mg Tbec (Aspirin) .... Take 1 tablet by mouth once a day  Other Orders: T-CBC w/Diff ([id number])   Patient Instructions: 1)  F/U in 5.5 months. 2)  It is important that you exercise regularly at least 20 minutes 5 times a week. If you develop chest pain, have severe difficulty breathing, or feel very tired , stop exercising immediately and seek medical attention. 3)  You need to lose weight. Consider a lower calorie diet and regular exercise.  4)  Check your Blood Pressure regularly. If it is above150/95  you should make an appointment. 5)  .Your BP is excellent. 6)  BMP prior to visit, ICD-9: 7)  Hepatic Panel prior to visit, ICD-9:  due in June, fasting. 8)  Lipid Panel prior to visit, ICD-9: 9)  TSH prior to visit, ICD-9: 10)  CBC w/ Diff prior to visit, ICD-9:   Prescriptions: CRESTOR 5 MG TABS (ROSUVASTATIN CALCIUM) Take 1 tab by mouth at bedtime  #30 x 5   Entered by:   Worthy Keeler LPN   Authorized by:   Syliva Overman MD   Signed by:   Worthy Keeler LPN on 59/56/3875   Method used:   [facility] to        [facility]  [address] [id number])       [address]       [place], [place]  [address]       Ph: [phone] or [phone]       Fax: [phone]   RxID:   [id number] HYDROCHLOROTHIAZIDE 25 MG  TABS (HYDROCHLOROTHIAZIDE) Take 1 tablet by mouth once a day  #30 x 5   Entered by:   Worthy Keeler LPN   Authorized by:   Syliva Overman MD   Signed by:   Worthy Keeler LPN on 76/16/0737   Method used:   [facility] to        [facility]  [address] [facility]       [address]       [place], [place]  [address]       Ph: [phone] or [phone]       Fax: [phone]   RxID:   [id number] ENALAPRIL MALEATE 20 MG  TABS (ENALAPRIL MALEATE) Take two tablets by mouth daily  #60 x 5   Entered by:   Worthy Keeler LPN   Authorized  by:   Syliva Overman MD   Signed by:   Worthy Keeler LPN on 82/42/3536   Method used:   [facility]ron[facility] to        [facility]  [address] [facility]       [address]       [place], [place]  [address]       Ph: [phone] or [phone]       Fax: [phone]   RxID:   [id number]  ]

## 2010-06-28 NOTE — Letter (Signed)
Summary: Letter  Letter   Imported By: Lind Guest 05/05/2010 16:46:59  _____________________________________________________________________  External Attachment:    Type:   Image     Comment:   External Document

## 2010-06-28 NOTE — Assessment & Plan Note (Signed)
Summary: office visit   Vital Signs:  Patient profile:   60 year old female Menstrual status:  postmenopausal Height:      68 inches Weight:      167.25 pounds BMI:     25.52 O2 Sat:      98 % Pulse rate:   87 / minute Pulse rhythm:   regular Resp:     16 per minute BP sitting:   150 / 90 Cuff size:   regular  Vitals Entered By: Everitt Amber (May 27, 2009 1:15 PM)  Nutrition Counseling: Patient's BMI is greater than 25 and therefore counseled on weight management options. CC: Follow up chronic problems   Primary Care Provider:  Syliva Overman MD  CC:  Follow up chronic problems.  History of Present Illness: Reports  that she has been doing well.Actually better than she expected. Denies recent fever or chills. Denies sinus pressure, nasal congestion , ear pain or sore throat. Denies chest congestion, or cough productive of sputum. Denies chest pain, palpitations, PND, orthopnea or leg swelling. Denies abdominal pain, nausea, vomitting, diarrhea or constipation. Denies change in bowel movements or bloody stool. Denies dysuria , frequency, incontinence or hesitancy. Denies  joint pain, swelling, or reduced mobility. Denies headaches, vertigo, seizures. Denies depression, anxiety or insomnia. Denies  rash, lesions, or itch.     Current Medications (verified): 1)  Diovan Hct 160-25 Mg  Tabs (Valsartan-Hydrochlorothiazide) .... Take 1 Tablet By Mouth Once A Day 2)  Enalapril Maleate 20 Mg  Tabs (Enalapril Maleate) .... Take Two Tablets By Mouth Daily 3)  Hydrochlorothiazide 25 Mg  Tabs (Hydrochlorothiazide) .... Take 1 Tablet By Mouth Once A Day 4)  Crestor 5 Mg Tabs (Rosuvastatin Calcium) .... Take 1 Tab By Mouth At Bedtime 5)  Adult Aspirin Ec Low Strength 81 Mg Tbec (Aspirin) .... Take 1 Tablet By Mouth Once A Day 6)  Oscal 500/200 D-3 500-200 Mg-Unit Tabs (Calcium-Vitamin D) .... Take 1 Tablet By Mouth Three Times A Day  Allergies (verified): No Known Drug  Allergies  Review of Systems      See HPI Eyes:  Denies blurring and discharge. ENT:  Complains of nasal congestion and postnasal drainage; denies sinus pressure; 1 week history after exposure to cold. Neuro:  Denies headaches, seizures, and sensation of room spinning. Heme:  Denies abnormal bruising and bleeding. Allergy:  Complains of seasonal allergies.  Physical Exam  General:  Well-developed,well-nourished,in no acute distress; alert,appropriate and cooperative throughout examination HEENT: No facial asymmetry,  EOMI, No sinus tenderness, TM's Clear, oropharynx  pink and moist.   Chest: Clear to auscultation bilaterally.  CVS: S1, S2, No murmurs, No S3.   Abd: Soft, Nontender.  MS: Adequate ROM spine, hips, shoulders and knees.  Ext: No edema.   CNS: CN 2-12 intact, power tone and sensation normal throughout.   Skin: Intact, no visible lesions or rashes.  Psych: Good eye contact, normal affect.  Memory intact, not anxious or depressed appearing. \par  Impression & Recommendations:  Problem # 1:  IMPAIRED GLUCOSE TOLERANCE (ICD-271.3) Assessment Comment Only  Orders: Hemoglobin A1C (83036)5.7, pt reassured of normal value and encouraged to resume healthy lifestyle  Problem # 2:  GRIEF REACTION, ACUTE (ICD-309.0) Assessment: Improved  Problem # 3:  HYPERTENSION (ICD-401.9) Assessment: Deteriorated  Her updated medication list for this problem includes:    Diovan Hct 160-25 Mg Tabs (Valsartan-hydrochlorothiazide) .Marland Kitchen... Take 1 tablet by mouth once a day    Enalapril Maleate 20 Mg Tabs (Enalapril maleate) .Marland KitchenMarland KitchenMarland KitchenMarland Kitchen  Take two tablets by mouth daily    Hydrochlorothiazide 25 Mg Tabs (Hydrochlorothiazide) .Marland Kitchen... Take 1 tablet by mouth once a day  Orders: T-Basic Metabolic Panel [id number])  BP today: 150/90, will return for nurse bP check,has not had meds today Prior BP: 130/90 (12/07/2008)  Labs Reviewed: K+: 4.0 (05/20/2009) Creat: : 0.92 (05/20/2009)   Chol: 132  (05/20/2009)   HDL: 39 (05/20/2009)   LDL: 68 (05/20/2009)   TG: 126 (05/20/2009)  Problem # 4:  HYPERLIPIDEMIA (ICD-272.4) Assessment: Unchanged  Her updated medication list for this problem includes:    Crestor 5 Mg Tabs (Rosuvastatin calcium) .Marland Kitchen... Take 1 tab by mouth at bedtime  Orders: T-Hepatic Function [id number]) T-Lipid Profile [id number])  Labs Reviewed: SGOT: 33 (05/20/2009)   SGPT: 27 (05/20/2009)   HDL:39 (05/20/2009), 42 (12/02/2008)  LDL:68 (05/20/2009), 61 (12/02/2008)  Chol:132 (05/20/2009), 123 (12/02/2008)  Trig:126 (05/20/2009), 100 (12/02/2008)  Complete Medication List: 1)  Diovan Hct 160-25 Mg Tabs (Valsartan-hydrochlorothiazide) .... Take 1 tablet by mouth once a day 2)  Enalapril Maleate 20 Mg Tabs (Enalapril maleate) .... Take two tablets by mouth daily 3)  Hydrochlorothiazide 25 Mg Tabs (Hydrochlorothiazide) .... Take 1 tablet by mouth once a day 4)  Crestor 5 Mg Tabs (Rosuvastatin calcium) .... Take 1 tab by mouth at bedtime 5)  Adult Aspirin Ec Low Strength 81 Mg Tbec (Aspirin) .... Take 1 tablet by mouth once a day 6)  Oscal 500/200 D-3 500-200 Mg-unit Tabs (Calcium-vitamin d) .... Take 1 tablet by mouth three times a day 7)  Flonase 50 Mcg/act Susp (Fluticasone propionate) .Marland Kitchen.. 1 to 2 puffs per nostril daily as needed for nasal congestion and uncontrolled allergies  Other Orders: T-Vitamin D (25-Hydroxy) ([id number])  Patient Instructions: 1)  nurse bP check in 1 month. 2)  mD f/u in 5months and 3.5 weeks 3)  It is important that you exercise regularly at least 20 minutes 5 times a week. If you develop chest pain, have severe difficulty breathing, or feel very tired , stop exercising immediately and seek medical attention. 4)  You need to lose weight. Consider a lower calorie diet and regular exercise. Goal of 6 to 10 pounds. 5)  labs are excellent. 6)  we will call if your vit D level is abnormal 7)  BMP prior to visit, ICD-9: 8)  Hepatic Panel  prior to visit, ICD-9: fasting in 5.5 months 9)  Lipid Panel prior to visit, ICD-9 10)  new med for allergiesis sent in: 11)  i am extremwely thankful that you are dong so much better. Prescriptions: CRESTOR 5 MG TABS (ROSUVASTATIN CALCIUM) Take 1 tab by mouth at bedtime  #30 x 4   Entered by:   Everitt Amber   Authorized by:   Syliva Overman MD   Signed by:   Everitt Amber on 05/27/2009   Method used:   Electronically to        [facility]  [address] [address] [id number] (retail)       [address]       [place], [place]  [address]       Ph: [phone] or [phone]       Fax: [phone]   RxID:   [id number] HYDROCHLOROTHIAZIDE 25 MG  TABS (HYDROCHLOROTHIAZIDE) Take 1 tablet by mouth once a day  #90 Tablet x 4   Entered by:   Everitt Amber   Authorized by:   Syliva Overman MD   Signed by:  Brandi Hudy on 05/27/2009   Method used:   Electronically to        [facility]  [address] [address] [id number] (retail)       [address]       [place], [place]  [address]       Ph: [phone] or [phone]       Fax: [phone]   RxID:   [id number] FLONASE 50 MCG/ACT SUSP (FLUTICASONE PROPIONATE) 1 to 2 puffs per nostril daily as needed for nasal congestion and uncontrolled allergies  #1 x 4   Entered and Authorized by:   Syliva Overman MD   Signed by:   Syliva Overman MD on 05/27/2009   Method used:   Electronically to        [facility]  [address] [address] [id number] (retail)       [address]       [place], [place]  [address]       Ph: [phone] or [phone]       Fax: [phone]   RxID:   [id number]        Laboratory Results   Blood Tests   Date/Time Received: May 27, 2009  Date/Time Reported: May 27, 2009   HGBA1C: 5.7%   (Normal Range: Non-Diabetic - 3-6%   Control Diabetic - 6-8%)

## 2010-06-28 NOTE — Miscellaneous (Signed)
Summary: flu  Clinical Lists Changes  Observations: Added new observation of FLU VAX: Historical (05/10/2009 15:54)      Influenza Immunization History:    Influenza # 1:  Historical (05/10/2009)

## 2010-06-28 NOTE — Letter (Signed)
Summary: Historic consults  Historic consults   Imported By: Lind Guest 10/06/2009 07:51:39  _____________________________________________________________________  External Attachment:    Type:   Image     Comment:   External Document

## 2010-06-28 NOTE — Progress Notes (Signed)
Summary: lab order  Phone Note Call from Patient   Summary of Call: going upstairs today to do labs needs paper faxed up there Initial call taken by: Lind Guest,  May 18, 2008 8:16 AM  Follow-up for Phone Call        order faxed Follow-up by: Worthy Keeler LPN,  May 18, 2008 10:32 AM

## 2010-06-28 NOTE — Letter (Signed)
Summary: Historic demo  Historic demo   Imported By: Lind Guest 10/06/2009 07:52:09  _____________________________________________________________________  External Attachment:    Type:   Image     Comment:   External Document

## 2010-06-28 NOTE — Letter (Signed)
Summary: Historic labs  Historic labs   Imported By: Lind Guest 10/06/2009 07:52:44  _____________________________________________________________________  External Attachment:    Type:   Image     Comment:   External Document

## 2010-06-28 NOTE — Assessment & Plan Note (Signed)
Summary: OV   Vital Signs:  Patient profile:   60 year old female Menstrual status:  postmenopausal Height:      68 inches Weight:      161.25 pounds BMI:     24.61 O2 Sat:      98 % Pulse rate:   112 / minute Pulse rhythm:   regular Resp:     16 per minute BP sitting:   130 / 90  (left arm) Cuff size:   regular  Vitals Entered By: Everitt Amber (December 07, 2008 1:32 PM) CC: Follow up chronic problems  years   days  Menstrual Status postmenopausal Last PAP Result normal   CC:  Follow up chronic problems.  History of Present Illness: Pt is recently widowed andis still having significant grief and a sense of loss, she was very close to her spouse and misses him alot.She is not suicidal or homicidal, she states that she is gradually getting better, but often still feels lost and overwhelmed. ]She is not interested in grief counselling outside of this visit at this time, and reports good support from family espescially. Patient reports doing well otherwise.  Denies any recent fever or chills.  Reports a reduced apetite and denies a change in her  bowel movements. Patient reporrts mild  insomnia.    Current Medications (verified): 1)  Diovan Hct 160-25 Mg  Tabs (Valsartan-Hydrochlorothiazide) .... Take 1 Tablet By Mouth Once A Day 2)  Enalapril Maleate 20 Mg  Tabs (Enalapril Maleate) .... Take Two Tablets By Mouth Daily 3)  Hydrochlorothiazide 25 Mg  Tabs (Hydrochlorothiazide) .... Take 1 Tablet By Mouth Once A Day 4)  Crestor 5 Mg Tabs (Rosuvastatin Calcium) .... Take 1 Tab By Mouth At Bedtime 5)  Adult Aspirin Ec Low Strength 81 Mg Tbec (Aspirin) .... Take 1 Tablet By Mouth Once A Day 6)  Oscal 500/200 D-3 500-200 Mg-Unit Tabs (Calcium-Vitamin D) .... Take 2 Tabs Once Daily  Allergies (verified): No Known Drug Allergies  Social History: EMPLOYED widow2ed in May 2010 Never Smoked Alcohol use-no Drug use-no Three healthy adult children  Review of Systems      See  HPI General:  See HPI; Complains of fatigue and sleep disorder; denies chills and fever. ENT:  Denies nasal congestion and sinus pressure. CV:  Denies chest pain or discomfort, difficulty breathing while lying down, palpitations, and swelling of feet. Resp:  Denies cough and sputum productive. GI:  See HPI; Denies constipation, diarrhea, nausea, and vomiting. GU:  Denies dysuria and urinary frequency. MS:  Denies joint pain, low back pain, mid back pain, and thoracic pain. Derm:  Denies itching, lesion(s), and rash. Neuro:  Denies headaches and poor balance. Psych:  See HPI. Endo:  Denies cold intolerance, excessive hunger, excessive thirst, excessive urination, heat intolerance, polyuria, and weight change. Heme:  Denies abnormal bruising and bleeding. Allergy:  Denies seasonal allergies.  Physical Exam  General:  alert, well-nourished, and well-hydrated. HEENT: No facial asymmetry,  EOMI, No sinus tenderness, TM's Clear, oropharynx  pink and moist.   Chest: Clear to auscultation bilaterally.  CVS: S1, S2, No murmurs, No S3.   Abd: Soft, Nontender.  MS: Adequate ROM spine, hips, shoulders and knees.  Ext: No edema.   CNS: CN 2-12 intact, power tone and sensation normal throughout.   Skin: Intact, no visible lesions or rashes.  Psych: Good eye contact, flat affect.  Memory intact, anxious and depressed appearing.Tearful.      Impression & Recommendations:  Problem #  1:  SPECIAL SCREENING FOR OSTEOPOROSIS (ICD-V82.81) Assessment Comment Only  Orders: Radiology Referral (Radiology)  Problem # 2:  GRIEF REACTION, ACUTE (ICD-309.0) Assessment: Comment Only  Problem # 3:  HYPERLIPIDEMIA (ICD-272.4) Assessment: Improved  Her updated medication list for this problem includes:    Crestor 5 Mg Tabs (Rosuvastatin calcium) .Marland Kitchen... Take 1 tab by mouth at bedtime  Labs Reviewed: SGOT: 33 (12/02/2008)   SGPT: 30 (12/02/2008)   HDL:42 (12/02/2008), 41 (05/18/2008)  LDL:61  (12/02/2008), 78 (62/83/1517)  Chol:123 (12/02/2008), 148 (05/18/2008)  Trig:100 (12/02/2008), 147 (05/18/2008)  Problem # 4:  HYPERTENSION (ICD-401.9) Assessment: Deteriorated  Her updated medication list for this problem includes:    Diovan Hct 160-25 Mg Tabs (Valsartan-hydrochlorothiazide) .Marland Kitchen... Take 1 tablet by mouth once a day    Enalapril Maleate 20 Mg Tabs (Enalapril maleate) .Marland Kitchen... Take two tablets by mouth daily    Hydrochlorothiazide 25 Mg Tabs (Hydrochlorothiazide) .Marland Kitchen... Take 1 tablet by mouth once a day  BP today: 130/90, likely due to inc stress and poor sleep Prior BP: 120/80 (05/26/2008)  Labs Reviewed: K+: 3.9 (12/02/2008) Creat: : 0.87 (12/02/2008)   Chol: 123 (12/02/2008)   HDL: 42 (12/02/2008)   LDL: 61 (12/02/2008)   TG: 100 (12/02/2008)  Complete Medication List: 1)  Diovan Hct 160-25 Mg Tabs (Valsartan-hydrochlorothiazide) .... Take 1 tablet by mouth once a day 2)  Enalapril Maleate 20 Mg Tabs (Enalapril maleate) .... Take two tablets by mouth daily 3)  Hydrochlorothiazide 25 Mg Tabs (Hydrochlorothiazide) .... Take 1 tablet by mouth once a day 4)  Crestor 5 Mg Tabs (Rosuvastatin calcium) .... Take 1 tab by mouth at bedtime 5)  Adult Aspirin Ec Low Strength 81 Mg Tbec (Aspirin) .... Take 1 tablet by mouth once a day 6)  Oscal 500/200 D-3 500-200 Mg-unit Tabs (Calcium-vitamin d) .... Take 2 tabs once daily  Other Orders: T-CBC w/Diff ([id number]) T-TSH [id number]) Zoster (Shingles) Vaccine Live [phone]) Admin 1st Vaccine ([id number])  Patient Instructions: 1)  Please schedule a follow-up appointment in 2 months. 2)  It is important that you exercise regularly at least 45 minutes 6 times a week. If you develop chest pain, have severe difficulty breathing, or feel very tired , stop exercising immediately and seek medical attention. 3)  although your bP is a little high you will not have any med changes at this time. 4)  pls consider therapy in the next 4 to 6 weeks if  you think that you need it just call foa referral. 5)  Ithink that you are doing very well overall, my prayers are with you, and I know that with time your hurt and pain will  eventually lessen. 6)  TSH prior to visit, ICD-9:    non fasting.today. 7)  CBC w/ Diff prior to visit, ICD-9:     Immunizations Administered:  Zostavax # 1:    Vaccine Type: Zostavax    Site: right deltoid    Mfr: Merck    Dose: 0.5 ml    Route:     Given by: Worthy Keeler LPN    Exp. Date: 04/20/2009    Lot #: [id number]    VIS given: 03/10/05 given December 07, 2008.

## 2010-06-28 NOTE — Assessment & Plan Note (Signed)
Summary: BP CK  Nurse Visit   Vital Signs:  Patient profile:   60 year old female Menstrual status:  postmenopausal Height:      68 inches BP sitting:   128 / 80  Vitals Entered By: Everitt Amber (July 12, 2009 4:46 PM) Comments Blood pressure check per Dr. Lodema Hong   Allergies: No Known Drug Allergies  Orders Added: 1)  No Charge Patient Arrived (NCPA0) [NCPA0]

## 2010-06-28 NOTE — Assessment & Plan Note (Signed)
Summary: office visit   Vital Signs:  Patient profile:   60 year old female Menstrual status:  postmenopausal Height:      68 inches Weight:      159.50 pounds BMI:     24.34 O2 Sat:      97 % Pulse (ortho):   72 / minute Pulse rhythm:   regular Resp:     16 per minute BP sitting:   124 / 80  (left arm)  Vitals Entered By: Everitt Amber LPN (November 10, 2009 9:32 AM) CC: Follow up chronic problems   Primary Care Provider:  Syliva Overman MD  CC:  Follow up chronic problems.  History of Present Illness: Reports  that she has been doing well. Denies recent fever or chills. Denies sinus pressure, nasal congestion , ear pain or sore throat. Denies chest congestion, or cough productive of sputum. Denies chest pain, palpitations, PND, orthopnea or leg swelling. Denies abdominal pain, nausea, vomitting, diarrhea or constipation. Denies change in bowel movements or bloody stool. Denies dysuria , frequency, incontinence or hesitancy. Denies  joint pain, swelling, or reduced mobility. Denies headaches, vertigo, seizures. Denies depression, anxiety or insomnia.She did have  a " bad day  recently on the day of the 1 yr anniversarry of her spouse's death. Denies  rash, lesions, or itch. the pt is walking daily, and is diligent with herdiet with excellent results a s far as weight loss and general well being are concerned.     Current Medications (verified): 1)  Diovan Hct 160-25 Mg  Tabs (Valsartan-Hydrochlorothiazide) .... Take 1 Tablet By Mouth Once A Day 2)  Enalapril Maleate 20 Mg  Tabs (Enalapril Maleate) .... Take Two Tablets By Mouth Daily 3)  Hydrochlorothiazide 25 Mg  Tabs (Hydrochlorothiazide) .... Take 1 Tablet By Mouth Once A Day 4)  Crestor 5 Mg Tabs (Rosuvastatin Calcium) .... Take 1 Tab By Mouth At Bedtime 5)  Adult Aspirin Ec Low Strength 81 Mg Tbec (Aspirin) .... Take 1 Tablet By Mouth Once A Day 6)  Oscal 500/200 D-3 500-200 Mg-Unit Tabs (Calcium-Vitamin D) .... Take  1 Tablet By Mouth Three Times A Day 7)  Flonase 50 Mcg/act Susp (Fluticasone Propionate) .Marland Kitchen.. 1 To 2 Puffs Per Nostril Daily As Needed For Nasal Congestion and Uncontrolled Allergies  Allergies (verified): No Known Drug Allergies  Review of Systems      See HPI General:  Complains of fatigue; occasional. Eyes:  Denies blurring, discharge, eye pain, and red eye. Endo:  Denies cold intolerance, excessive hunger, excessive thirst, excessive urination, heat intolerance, polyuria, and weight change. Heme:  Denies abnormal bruising and bleeding. Allergy:  Complains of seasonal allergies; denies hives or rash and itching eyes.  Physical Exam  General:  Well-developed,well-nourished,in no acute distress; alert,appropriate and cooperative throughout examination HEENT: No facial asymmetry,  EOMI, No sinus tenderness, TM's Clear, oropharynx  pink and moist.   Chest: Clear to auscultation bilaterally.  CVS: S1, S2, No murmurs, No S3.   Abd: Soft, Nontender.  MS: Adequate ROM spine, hips, shoulders and knees.  Ext: No edema.   CNS: CN 2-12 intact, power tone and sensation normal throughout.   Skin: Intact, no visible lesions or rashes.  Psych: Good eye contact, normal affect.  Memory intact, not anxious or depressed appearing. \par  Impression & Recommendations:  Problem # 1:  IMPAIRED FASTING GLUCOSE (ICD-790.21) Assessment Comment Only  Orders: T- Hemoglobin A1C ([id number]), impt of carb reduction, exercise and weight loss stressed  Labs Reviewed: Creat: 0.95 (  11/05/2009)     Problem # 2:  HYPERLIPIDEMIA (ICD-272.4) Assessment: Improved  Her updated medication list for this problem includes:    Crestor 5 Mg Tabs (Rosuvastatin calcium) .Marland Kitchen... Take 1 tab by mouth at bedtime  Orders: T-Hepatic Function [phone]) T-Lipid Profile [id number])  Labs Reviewed: SGOT: 28 (11/05/2009)   SGPT: 24 (11/05/2009)   HDL:39 (11/05/2009), 39 (05/20/2009)  LDL:56 (11/05/2009), 68  (10/93/2355)  Chol:114 (11/05/2009), 132 (05/20/2009)  Trig:97 (11/05/2009), 126 (05/20/2009), pt to reduce crestor dose  Problem # 3:  HYPERTENSION (ICD-401.9) Assessment: Unchanged  Her updated medication list for this problem includes:    Diovan Hct 160-25 Mg Tabs (Valsartan-hydrochlorothiazide) .Marland Kitchen... Take 1 tablet by mouth once a day    Enalapril Maleate 20 Mg Tabs (Enalapril maleate) .Marland Kitchen... Take two tablets by mouth daily    Hydrochlorothiazide 25 Mg Tabs (Hydrochlorothiazide) .Marland Kitchen... Take 1 tablet by mouth once a day  Orders: T-Basic Metabolic Panel [id number])  BP today: 124/80 Prior BP: 128/80 (07/12/2009)  Labs Reviewed: K+: 4.2 (11/05/2009) Creat: : 0.95 (11/05/2009)   Chol: 114 (11/05/2009)   HDL: 39 (11/05/2009)   LDL: 56 (11/05/2009)   TG: 97 (11/05/2009)  Complete Medication List: 1)  Diovan Hct 160-25 Mg Tabs (Valsartan-hydrochlorothiazide) .... Take 1 tablet by mouth once a day 2)  Enalapril Maleate 20 Mg Tabs (Enalapril maleate) .... Take two tablets by mouth daily 3)  Hydrochlorothiazide 25 Mg Tabs (Hydrochlorothiazide) .... Take 1 tablet by mouth once a day 4)  Crestor 5 Mg Tabs (Rosuvastatin calcium) .... Take 1 tab by mouth at bedtime 5)  Adult Aspirin Ec Low Strength 81 Mg Tbec (Aspirin) .... Take 1 tablet by mouth once a day 6)  Oscal 500/200 D-3 500-200 Mg-unit Tabs (Calcium-vitamin d) .... Take 1 tablet by mouth three times a day 7)  Flonase 50 Mcg/act Susp (Fluticasone propionate) .Marland Kitchen.. 1 to 2 puffs per nostril daily as needed for nasal congestion and uncontrolled allergies  Other Orders: T-CBC w/Diff ([id number])  Patient Instructions: 1)  Please schedule a follow-up appointment in 4 months. 2)  It is important that you exercise regularly at least 20 minutes 5 times a week. If you develop chest pain, have severe difficulty breathing, or feel very tired , stop exercising immediately and seek medical attention. 3)  You need to lose weight. Consider a lower  calorie diet and regular exercise. Congrats on your weight losss, Five more pounds needed 4)  BMP prior to visit, ICD-9: 5)  Hepatic Panel prior to visit, ICD-9: 6)  Lipid Panel prior to visit, ICD-9:  fasting in 4 months 7)  HbgA1C prior to visit, ICD-9: 8)  CBC 9)  pls ensure you get your mamo when due 10)  Reduce the crestor to Mon thru Thursday onl;y. 11)  Increas to 5 days per week of exercise

## 2010-06-28 NOTE — Letter (Signed)
Summary: Historic office notes  Historic office notes   Imported By: Lind Guest 10/06/2009 07:55:17  _____________________________________________________________________  External Attachment:    Type:   Image     Comment:   External Document

## 2010-06-30 ENCOUNTER — Other Ambulatory Visit: Payer: Self-pay | Admitting: Obstetrics & Gynecology

## 2010-06-30 ENCOUNTER — Other Ambulatory Visit (HOSPITAL_COMMUNITY)
Admission: RE | Admit: 2010-06-30 | Discharge: 2010-06-30 | Disposition: A | Discharge status: Home / Self Care | Payer: BC Managed Care – PPO | Source: Ambulatory Visit | Attending: Obstetrics & Gynecology | Admitting: Obstetrics & Gynecology

## 2010-06-30 DIAGNOSIS — Z01419 Encounter for gynecological examination (general) (routine) without abnormal findings: Secondary | ICD-10-CM | POA: Insufficient documentation

## 2010-07-18 ENCOUNTER — Telehealth: Payer: Self-pay | Admitting: Family Medicine

## 2010-07-26 NOTE — Progress Notes (Signed)
Summary: medicine  Phone Note Call from Patient   Summary of Call: [facility] in [place] please send her diovan   [facility] [place] [facility] medicine the diovan Initial call taken by: Lind Guest,  July 18, 2010 2:33 PM    Prescriptions: DIOVAN HCT 320-25 MG TABS (VALSARTAN-HYDROCHLOROTHIAZIDE) Take 1 tablet by mouth once a day  #30 x 1   Entered by:   Adella Hare LPN   Authorized by:   Syliva Overman MD   Signed by:   Adella Hare LPN on 16/02/9603   Method used:   Electronically to        [id number][facility] (retail)       [address]       [place], [place]  [address]       Ph: [phone] or [phone]       Fax: [phone]   RxID:   [id number]

## 2010-08-31 ENCOUNTER — Ambulatory Visit: Payer: Self-pay | Admitting: Family Medicine

## 2010-09-19 ENCOUNTER — Telehealth: Payer: Self-pay | Admitting: Family Medicine

## 2010-09-19 DIAGNOSIS — E785 Hyperlipidemia, unspecified: Secondary | ICD-10-CM

## 2010-09-19 DIAGNOSIS — R5381 Other malaise: Secondary | ICD-10-CM

## 2010-09-19 DIAGNOSIS — I10 Essential (primary) hypertension: Secondary | ICD-10-CM

## 2010-09-19 DIAGNOSIS — R7301 Impaired fasting glucose: Secondary | ICD-10-CM

## 2010-09-19 NOTE — Telephone Encounter (Signed)
What labs need to be ordered 

## 2010-09-19 NOTE — Telephone Encounter (Signed)
fastting lipid, he[patic, chem7 and TSH and hba1c

## 2010-09-21 ENCOUNTER — Encounter: Payer: Self-pay | Admitting: Family Medicine

## 2010-09-22 LAB — BASIC METABOLIC PANEL
BUN: 16 mg/dL (ref 6–23)
CO2: 27 mEq/L (ref 19–32)
Calcium: 10.2 mg/dL (ref 8.4–10.5)
Chloride: 102 mEq/L (ref 96–112)
Creat: 0.88 mg/dL (ref 0.40–1.20)
Glucose, Bld: 90 mg/dL (ref 70–99)
Potassium: 3.9 mEq/L (ref 3.5–5.3)
Sodium: 144 mEq/L (ref 135–145)

## 2010-09-22 LAB — HEPATIC FUNCTION PANEL
ALT: 24 U/L (ref 0–35)
AST: 23 U/L (ref 0–37)
Albumin: 4.8 g/dL (ref 3.5–5.2)
Alkaline Phosphatase: 73 U/L (ref 39–117)
Bilirubin, Direct: 0.1 mg/dL (ref 0.0–0.3)
Indirect Bilirubin: 0.6 mg/dL (ref 0.0–0.9)
Total Bilirubin: 0.7 mg/dL (ref 0.3–1.2)
Total Protein: 7.4 g/dL (ref 6.0–8.3)

## 2010-09-22 LAB — LIPID PANEL
Cholesterol: 129 mg/dL (ref 0–200)
HDL: 38 mg/dL — ABNORMAL LOW (ref >39)
LDL Cholesterol: 59 mg/dL (ref 0–99)
Total CHOL/HDL Ratio: 3.4 Ratio
Triglycerides: 161 mg/dL — ABNORMAL HIGH (ref <150)
VLDL: 32 mg/dL (ref 0–40)

## 2010-09-23 LAB — TSH: TSH: 3.898 u[IU]/mL (ref 0.350–4.500)

## 2010-09-23 LAB — HEMOGLOBIN A1C
Hgb A1c MFr Bld: 5.7 % — ABNORMAL HIGH (ref <5.7)
Mean Plasma Glucose: 117 mg/dL — ABNORMAL HIGH (ref <117)

## 2010-09-26 ENCOUNTER — Encounter: Payer: Self-pay | Admitting: Family Medicine

## 2010-09-27 ENCOUNTER — Ambulatory Visit (INDEPENDENT_AMBULATORY_CARE_PROVIDER_SITE_OTHER): Payer: BC Managed Care – PPO | Admitting: Family Medicine

## 2010-09-27 ENCOUNTER — Encounter: Payer: Self-pay | Admitting: Family Medicine

## 2010-09-27 VITALS — BP 146/90 | HR 115 | Resp 16 | Ht 68.0 in | Wt 164.1 lb

## 2010-09-27 DIAGNOSIS — I10 Essential (primary) hypertension: Secondary | ICD-10-CM

## 2010-09-27 DIAGNOSIS — R7301 Impaired fasting glucose: Secondary | ICD-10-CM

## 2010-09-27 DIAGNOSIS — Z23 Encounter for immunization: Secondary | ICD-10-CM

## 2010-09-27 DIAGNOSIS — E785 Hyperlipidemia, unspecified: Secondary | ICD-10-CM

## 2010-09-27 MED ORDER — PRAVASTATIN SODIUM 40 MG PO TABS: 40.0000 mg | ORAL_TABLET | Freq: Every evening | ORAL | Status: DC

## 2010-09-27 MED ORDER — CLONIDINE HCL 0.1 MG PO TABS: 0.1000 mg | ORAL_TABLET | Freq: Every day | ORAL | Status: DC

## 2010-09-27 NOTE — Assessment & Plan Note (Signed)
Medication compliance addressed. Commitment to regular exercise and healthy  food choices, with portion control discussed. DASH diet and low fat diet discussed and literature offered. Changes in medication made at this visit. Primary reason for change is due to cost

## 2010-09-27 NOTE — Patient Instructions (Signed)
F/u in 4 months.  It is important that you exercise regularly at least 30 minutes 5 times a week. If you develop chest pain, have severe difficulty breathing, or feel very tired, stop exercising immediately and seek medical attention   A healthy diet is rich in fruit, vegetables and whole grains. Poultry fish, nuts and beans are a healthy choice for protein rather then red meat. A low sodium diet and drinking 64 ounces of water daily is generally recommended. Oils and sweet should be limited. Carbohydrates especially for those who are diabetic or overweight, should be limited to 34-45 gram per meal. It is important to eat on a regular schedule, at least 3 times daily. Snacks should be primarily fruits, vegetables or nuts.   Med changes as discussed.

## 2010-09-27 NOTE — Progress Notes (Signed)
  Subjective:    Patient ID: Wanda Ross, female    DOB: [birth year], 60 y.o.   MRN: [id number]  HPI The PT is here for follow up and re-evaluation of chronic medical conditions, medication management and review of recent lab and radiology data.  Preventive health is updated, specifically  Cancer screening,  and Immunization.   Questions or concerns regarding consultations or procedures which the PT has had in the interim are  addressed. The PT denies any adverse reactions to current medications since the last visit.  There are no new concerns.  There are no specific complaints. She has her blood pressure machine which does correspond to the readings obtained in the office, as usual she is anxious and I obtain higher reading than what she usually gets at home.       Review of Systems Denies recent fever or chills. Denies sinus pressure, nasal congestion, ear pain or sore throat. Denies chest congestion, productive cough or wheezing. Denies chest pains, palpitations, paroxysmal nocturnal dyspnea, orthopnea and leg swelling Denies abdominal pain, nausea, vomiting,diarrhea or constipation.  Denies rectal bleeding or change in bowel movement. Denies dysuria, frequency, hesitancy or incontinence. Denies joint pain, swelling and limitation in mobility. Denies headaches, seizure, numbness, or tingling. Denies depression, anxiety or insomnia. Denies skin break down or rash.        Objective:   Physical Exam Patient alert and oriented and in no Cardiopulmonary distress.  HEENT: No facial asymmetry, EOMI, no sinus tenderness, TM's clear, Oropharynx pink and moist.  Neck supple no adenopathy.  Chest: Clear to auscultation bilaterally.  CVS: S1, S2 no murmurs, no S3.  ABD: Soft non tender. Bowel sounds normal.  Ext: No edema  MS: Adequate ROM spine, shoulders, hips and knees.  Skin: Intact, no ulcerations or rash noted.  Psych: Good eye contact, normal affect. Memory intact  not  depressed appearing.Mildly anxious  CNS: CN 2-12 intact, power, tone and sensation normal throughout.        Assessment & Plan:

## 2010-09-27 NOTE — Assessment & Plan Note (Signed)
Medication compliance addressed. Commitment to regular exercise and healthy  food choices, with portion control discussed. DASH diet and low fat diet discussed and literature offered. Changes in medication made at this visit.  

## 2010-09-29 ENCOUNTER — Other Ambulatory Visit: Payer: Self-pay

## 2010-09-29 ENCOUNTER — Telehealth: Payer: Self-pay | Admitting: Family Medicine

## 2010-09-29 DIAGNOSIS — I10 Essential (primary) hypertension: Secondary | ICD-10-CM

## 2010-09-29 MED ORDER — VALSARTAN-HYDROCHLOROTHIAZIDE 320-25 MG PO TABS: 1.0000 | ORAL_TABLET | Freq: Every day | ORAL | Status: DC

## 2010-09-29 MED ORDER — CLONIDINE HCL 0.1 MG PO TABS: 0.1000 mg | ORAL_TABLET | Freq: Every day | ORAL | Status: DC

## 2010-09-29 NOTE — Telephone Encounter (Signed)
Resent meds to [facility]

## 2010-10-14 NOTE — Op Note (Signed)
NAMESHARIFA, Wanda Ross            ACCOUNT NO.:  [id number]   MEDICAL RECORD NO.:  [id number]          PATIENT TYPE:  AMB   LOCATION:  DAY                           FACILITY:  [facility]   PHYSICIAN:  Kassie Mends, M.D.      DATE OF BIRTH:  April 17, 1951   DATE OF PROCEDURE:  06/22/2006  DATE OF DISCHARGE:                               OPERATIVE REPORT   REFERRING PHYSICIAN:  Milus Mallick. Lodema Hong, M.D.   PROCEDURE:  Colonoscopy.   INDICATIONS FOR EXAM:  Wanda Ross is a 60 year old female who had a  personal history of polyps.  She had a colonoscopy performed  approximately five years ago and had an inflammatory polyp.  The  recommendation was that she have a follow-up colonoscopy in five years.  She also had colitis at the time.   FINDINGS:  1. Normal colon without evidence of polyps, masses, diverticula,      inflammatory changes, or AVM.  2. Normal retroflexed view of the rectum.   RECOMMENDATIONS:  1. Screening colonoscopy in 10 years.  2. Follow up with Dr. Lodema Hong.   MEDICATIONS:  1. Demerol 75 mg IV.  2. Versed 3 mg IV.   PROCEDURAL TECHNIQUE:  Physical exam was performed, and informed consent  was obtained per the patient after explaining the risks, benefits and  alternatives to the procedure.  Patient was connected to the monitor and  placed in the left lateral position.  Continuous oxygen was provided by  nasal cannula, and IV medicine administered through the indwelling  cannula.  After administration of sedation and rectal exam, the  patient's rectum was  intubated, and the scope was advanced under direct visualization to the  cecum.  The scope was subsequently removed slowly, by carefully examined  the color, texture, anatomy, and integrity of the mucosa on the way out.  The patient was recovered in endoscopy suite and discharged home in  satisfactory condition.      Kassie Mends, M.D.  Electronically Signed     SM/MEDQ  D:  06/22/2006  T:  06/22/2006  Job:   [id number]   cc:   Milus Mallick. Lodema Hong, M.D.  Fax: [phone]

## 2010-11-07 ENCOUNTER — Other Ambulatory Visit: Payer: Self-pay | Admitting: Family Medicine

## 2011-02-03 ENCOUNTER — Ambulatory Visit: Payer: BC Managed Care – PPO | Admitting: Family Medicine

## 2011-02-11 LAB — HEPATIC FUNCTION PANEL
ALT: 15 U/L (ref 0–35)
AST: 22 U/L (ref 0–37)
Albumin: 4.6 g/dL (ref 3.5–5.2)
Alkaline Phosphatase: 72 U/L (ref 39–117)
Bilirubin, Direct: 0.2 mg/dL (ref 0.0–0.3)
Indirect Bilirubin: 0.5 mg/dL (ref 0.0–0.9)
Total Bilirubin: 0.7 mg/dL (ref 0.3–1.2)
Total Protein: 7.2 g/dL (ref 6.0–8.3)

## 2011-02-11 LAB — LIPID PANEL
Cholesterol: 137 mg/dL (ref 0–200)
HDL: 39 mg/dL — ABNORMAL LOW (ref >39)
LDL Cholesterol: 75 mg/dL (ref 0–99)
Total CHOL/HDL Ratio: 3.5 Ratio
Triglycerides: 117 mg/dL (ref <150)
VLDL: 23 mg/dL (ref 0–40)

## 2011-02-11 LAB — BASIC METABOLIC PANEL
BUN: 14 mg/dL (ref 6–23)
CO2: 29 mEq/L (ref 19–32)
Calcium: 9.9 mg/dL (ref 8.4–10.5)
Chloride: 103 mEq/L (ref 96–112)
Creat: 0.73 mg/dL (ref 0.50–1.10)
Glucose, Bld: 102 mg/dL — ABNORMAL HIGH (ref 70–99)
Potassium: 3.8 mEq/L (ref 3.5–5.3)
Sodium: 142 mEq/L (ref 135–145)

## 2011-02-11 LAB — HEMOGLOBIN A1C
Hgb A1c MFr Bld: 5.6 % (ref <5.7)
Mean Plasma Glucose: 114 mg/dL (ref <117)

## 2011-02-14 ENCOUNTER — Encounter: Payer: Self-pay | Admitting: Family Medicine

## 2011-02-15 ENCOUNTER — Encounter: Payer: Self-pay | Admitting: Family Medicine

## 2011-02-15 ENCOUNTER — Ambulatory Visit (INDEPENDENT_AMBULATORY_CARE_PROVIDER_SITE_OTHER): Payer: BC Managed Care – PPO | Admitting: Family Medicine

## 2011-02-15 VITALS — BP 130/86 | HR 84 | Resp 16 | Ht 68.0 in | Wt 158.4 lb

## 2011-02-15 DIAGNOSIS — E739 Lactose intolerance, unspecified: Secondary | ICD-10-CM

## 2011-02-15 DIAGNOSIS — Z23 Encounter for immunization: Secondary | ICD-10-CM

## 2011-02-15 DIAGNOSIS — R5381 Other malaise: Secondary | ICD-10-CM

## 2011-02-15 DIAGNOSIS — R7301 Impaired fasting glucose: Secondary | ICD-10-CM

## 2011-02-15 DIAGNOSIS — E785 Hyperlipidemia, unspecified: Secondary | ICD-10-CM

## 2011-02-15 DIAGNOSIS — I10 Essential (primary) hypertension: Secondary | ICD-10-CM

## 2011-02-15 DIAGNOSIS — R5383 Other fatigue: Secondary | ICD-10-CM

## 2011-02-15 MED ORDER — VALSARTAN-HYDROCHLOROTHIAZIDE 320-25 MG PO TABS: ORAL_TABLET | ORAL | Status: DC

## 2011-02-15 NOTE — Patient Instructions (Addendum)
F/u   In 5 moths  You are no longer prediabetic.   Cholesterol is much better!!   CBC , tSH, Lipid, hepatic, chem 7, hBA1C   In 5 months  Fasting in 5 months  Mammogram due December, pap due Feb pls sched both  It is important that you exercise regularly at least 60 minutes 5 times a week. If you develop chest pain, have severe difficulty breathing, or feel very tired, stop exercising immediately and seek medical attention   I encourage you to start going to the gym   A healthy diet is rich in fruit, vegetables and whole grains. Poultry fish, nuts and beans are a healthy choice for protein rather then red meat. A low sodium diet and drinking 64 ounces of water daily is generally recommended. Oils and sweet should be limited. Carbohydrates especially for those who are diabetic or overweight, should be limited to 30-45 gram per meal. It is important to eat on a regular schedule, at least 3 times daily. Snacks should be primarily fruits, vegetables or nuts  Congrats on weight loss

## 2011-02-19 NOTE — Assessment & Plan Note (Signed)
HBA1C improved, pt applauded on healthy lifestyle changes which are succesful, encouraged to continue same

## 2011-02-19 NOTE — Assessment & Plan Note (Signed)
Controlled, no change in medication  

## 2011-02-19 NOTE — Assessment & Plan Note (Signed)
Improved, applauded on this, continue medication as before

## 2011-02-19 NOTE — Progress Notes (Signed)
  Subjective:    Patient ID: Wanda Ross, female    DOB: Jul 01, 1950, 60 y.o.   MRN: [id number]  HPI The PT is here for follow up and re-evaluation of chronic medical conditions, medication management and review of any available recent lab and radiology data.  Preventive health is updated, specifically  Cancer screening and Immunization.   Questions or concerns regarding consultations or procedures which the PT has had in the interim are  addressed. The PT denies any adverse reactions to current medications since the last visit.  There are no new concerns.  There are no specific complaints. Pt very invested in well health and reports daily exercise and close attention to diet, is a member of weight watchers and enjoys this       Review of Systems Denies recent fever or chills. Denies sinus pressure, nasal congestion, ear pain or sore throat. Denies chest congestion, productive cough or wheezing. Denies chest pains, palpitations and leg swelling Denies abdominal pain, nausea, vomiting,diarrhea or constipation.   Denies dysuria, frequency, hesitancy or incontinence. Denies joint pain, swelling and limitation in mobility. Denies headaches, seizures, numbness, or tingling. Denies depression, anxiety or insomnia. Denies skin break down or rash.        Objective:   Physical Exam Patient alert and oriented and in no cardiopulmonary distress.  HEENT: No facial asymmetry, EOMI, no sinus tenderness,  oropharynx pink and moist.  Neck supple no adenopathy.  Chest: Clear to auscultation bilaterally.  CVS: S1, S2 no murmurs, no S3.  ABD: Soft non tender. Bowel sounds normal.  Ext: No edema  MS: Adequate ROM spine, shoulders, hips and knees.  Skin: Intact, no ulcerations or rash noted.  Psych: Good eye contact, normal affect. Memory intact not anxious or depressed appearing.  CNS: CN 2-12 intact, power, tone and sensation normal throughout.        Assessment & Plan:

## 2011-02-22 ENCOUNTER — Other Ambulatory Visit: Payer: Self-pay | Admitting: Family Medicine

## 2011-05-15 ENCOUNTER — Other Ambulatory Visit: Payer: Self-pay | Admitting: Family Medicine

## 2011-05-15 DIAGNOSIS — Z139 Encounter for screening, unspecified: Secondary | ICD-10-CM

## 2011-05-25 ENCOUNTER — Ambulatory Visit (HOSPITAL_COMMUNITY)
Admission: RE | Admit: 2011-05-25 | Discharge: 2011-05-25 | Disposition: A | Discharge status: Home / Self Care | Payer: BC Managed Care – PPO | Source: Ambulatory Visit | Attending: Family Medicine | Admitting: Family Medicine

## 2011-05-25 DIAGNOSIS — Z139 Encounter for screening, unspecified: Secondary | ICD-10-CM

## 2011-05-25 DIAGNOSIS — Z1231 Encounter for screening mammogram for malignant neoplasm of breast: Secondary | ICD-10-CM | POA: Insufficient documentation

## 2011-07-06 ENCOUNTER — Other Ambulatory Visit (HOSPITAL_COMMUNITY)
Admission: RE | Admit: 2011-07-06 | Discharge: 2011-07-06 | Disposition: A | Discharge status: Home / Self Care | Payer: BC Managed Care – PPO | Source: Ambulatory Visit | Attending: Obstetrics & Gynecology | Admitting: Obstetrics & Gynecology

## 2011-07-06 ENCOUNTER — Other Ambulatory Visit: Payer: Self-pay | Admitting: Obstetrics & Gynecology

## 2011-07-06 DIAGNOSIS — Z01419 Encounter for gynecological examination (general) (routine) without abnormal findings: Secondary | ICD-10-CM | POA: Insufficient documentation

## 2011-07-18 ENCOUNTER — Ambulatory Visit: Payer: BC Managed Care – PPO | Admitting: Family Medicine

## 2011-08-02 LAB — CBC WITH DIFFERENTIAL/PLATELET
Basophils Absolute: 0 10*3/uL (ref 0.0–0.1)
Basophils Relative: 1 % (ref 0–1)
Eosinophils Absolute: 0.2 10*3/uL (ref 0.0–0.7)
Eosinophils Relative: 3 % (ref 0–5)
HCT: 44.8 % (ref 36.0–46.0)
Hemoglobin: 14.8 g/dL (ref 12.0–15.0)
Lymphocytes Relative: 31 % (ref 12–46)
Lymphs Abs: 2 10*3/uL (ref 0.7–4.0)
MCH: 28.8 pg (ref 26.0–34.0)
MCHC: 33 g/dL (ref 30.0–36.0)
MCV: 87.3 fL (ref 78.0–100.0)
Monocytes Absolute: 0.6 10*3/uL (ref 0.1–1.0)
Monocytes Relative: 9 % (ref 3–12)
Neutro Abs: 3.6 10*3/uL (ref 1.7–7.7)
Neutrophils Relative %: 57 % (ref 43–77)
Platelets: 352 10*3/uL (ref 150–400)
RBC: 5.13 MIL/uL — ABNORMAL HIGH (ref 3.87–5.11)
RDW: 13.5 % (ref 11.5–15.5)
WBC: 6.4 10*3/uL (ref 4.0–10.5)

## 2011-08-02 LAB — HEPATIC FUNCTION PANEL
ALT: 22 U/L (ref 0–35)
AST: 22 U/L (ref 0–37)
Albumin: 4.6 g/dL (ref 3.5–5.2)
Alkaline Phosphatase: 73 U/L (ref 39–117)
Bilirubin, Direct: 0.2 mg/dL (ref 0.0–0.3)
Indirect Bilirubin: 0.5 mg/dL (ref 0.0–0.9)
Total Bilirubin: 0.7 mg/dL (ref 0.3–1.2)
Total Protein: 7 g/dL (ref 6.0–8.3)

## 2011-08-02 LAB — HEMOGLOBIN A1C
Hgb A1c MFr Bld: 6.1 % — ABNORMAL HIGH (ref <5.7)
Mean Plasma Glucose: 128 mg/dL — ABNORMAL HIGH (ref <117)

## 2011-08-02 LAB — BASIC METABOLIC PANEL
BUN: 16 mg/dL (ref 6–23)
CO2: 26 mEq/L (ref 19–32)
Calcium: 9.8 mg/dL (ref 8.4–10.5)
Chloride: 103 mEq/L (ref 96–112)
Creat: 0.64 mg/dL (ref 0.50–1.10)
Glucose, Bld: 95 mg/dL (ref 70–99)
Potassium: 4.1 mEq/L (ref 3.5–5.3)
Sodium: 142 mEq/L (ref 135–145)

## 2011-08-02 LAB — TSH: TSH: 2.474 u[IU]/mL (ref 0.350–4.500)

## 2011-08-02 LAB — LIPID PANEL
Cholesterol: 109 mg/dL (ref 0–200)
HDL: 36 mg/dL — ABNORMAL LOW (ref >39)
LDL Cholesterol: 53 mg/dL (ref 0–99)
Total CHOL/HDL Ratio: 3 Ratio
Triglycerides: 99 mg/dL (ref <150)
VLDL: 20 mg/dL (ref 0–40)

## 2011-08-04 ENCOUNTER — Ambulatory Visit (INDEPENDENT_AMBULATORY_CARE_PROVIDER_SITE_OTHER): Payer: BC Managed Care – PPO | Admitting: Family Medicine

## 2011-08-04 ENCOUNTER — Encounter: Payer: Self-pay | Admitting: Family Medicine

## 2011-08-04 VITALS — BP 152/76 | HR 111 | Resp 18 | Ht 68.0 in | Wt 162.1 lb

## 2011-08-04 DIAGNOSIS — I10 Essential (primary) hypertension: Secondary | ICD-10-CM

## 2011-08-04 DIAGNOSIS — E785 Hyperlipidemia, unspecified: Secondary | ICD-10-CM

## 2011-08-04 DIAGNOSIS — E739 Lactose intolerance, unspecified: Secondary | ICD-10-CM

## 2011-08-04 MED ORDER — CLONIDINE HCL 0.1 MG PO TABS: 0.1000 mg | ORAL_TABLET | Freq: Every day | ORAL | Status: DC

## 2011-08-04 NOTE — Assessment & Plan Note (Signed)
Uncontrolled, has not been taking clonidine

## 2011-08-04 NOTE — Patient Instructions (Signed)
F/u in 7 to 8 weeks  Your blood pressure is still too high, you need to take clonidine at bedtime, continue your other blood pressure medication.    Blood sugar has increased , and good cholesterol is decreased , please commit to regular exercise daily, and cut back on sweets and carbohydrates

## 2011-08-05 NOTE — Assessment & Plan Note (Signed)
Hyperlipidemia:Low fat diet discussed and encouraged.  Continue current med. Start regular exercise to inc the hDL

## 2011-08-05 NOTE — Assessment & Plan Note (Signed)
Deteriorated, pt to recommit to healthy lifestyle

## 2011-08-05 NOTE — Progress Notes (Signed)
  Subjective:    Patient ID: Wanda Ross, female    DOB: 24-Oct-1950, 61 y.o.   MRN: [id number]  HPI The PT is here for follow up and re-evaluation of chronic medical conditions, medication management and review of any available recent lab and radiology data.  Preventive health is updated, specifically  Cancer screening and Immunization.   Questions or concerns regarding consultations or procedures which the PT has had in the interim are  addressed. The PT denies any adverse reactions to current medications since the last visit.  There are no new concerns. Has been under increased stress, recent death in family, and upcoming wedding of her son. Has therefore not been as comited to healthy lifestyle There are no specific complaints       Review of Systems See HPI Denies recent fever or chills. Denies sinus pressure, nasal congestion, ear pain or sore throat. Denies chest congestion, productive cough or wheezing. Denies chest pains, palpitations and leg swelling Denies abdominal pain, nausea, vomiting,diarrhea or constipation.   Denies dysuria, frequency, hesitancy or incontinence. Denies joint pain, swelling and limitation in mobility. Denies headaches, seizures, numbness, or tingling. Denies depression, anxiety or insomnia. Denies skin break down or rash.        Objective:   Physical Exam Patient alert and oriented and in no cardiopulmonary distress.  HEENT: No facial asymmetry, EOMI, no sinus tenderness,  oropharynx pink and moist.  Neck supple no adenopathy.  Chest: Clear to auscultation bilaterally.  CVS: S1, S2 no murmurs, no S3.  ABD: Soft non tender. Bowel sounds normal.  Ext: No edema  MS: Adequate ROM spine, shoulders, hips and knees.  Skin: Intact, no ulcerations or rash noted.  Psych: Good eye contact, normal affect. Memory intact not anxious or depressed appearing.  CNS: CN 2-12 intact, power, tone and sensation normal throughout.          Assessment & Plan:

## 2011-08-09 ENCOUNTER — Other Ambulatory Visit: Payer: Self-pay | Admitting: Family Medicine

## 2011-09-29 ENCOUNTER — Encounter: Payer: Self-pay | Admitting: Family Medicine

## 2011-09-29 ENCOUNTER — Ambulatory Visit (INDEPENDENT_AMBULATORY_CARE_PROVIDER_SITE_OTHER): Payer: BC Managed Care – PPO | Admitting: Family Medicine

## 2011-09-29 VITALS — BP 140/84 | HR 96 | Resp 18 | Ht 68.0 in | Wt 161.1 lb

## 2011-09-29 DIAGNOSIS — I10 Essential (primary) hypertension: Secondary | ICD-10-CM

## 2011-09-29 DIAGNOSIS — E785 Hyperlipidemia, unspecified: Secondary | ICD-10-CM

## 2011-09-29 DIAGNOSIS — E042 Nontoxic multinodular goiter: Secondary | ICD-10-CM

## 2011-09-29 DIAGNOSIS — E739 Lactose intolerance, unspecified: Secondary | ICD-10-CM

## 2011-09-29 MED ORDER — PRAVASTATIN SODIUM 40 MG PO TABS: 40.0000 mg | ORAL_TABLET | Freq: Every evening | ORAL | Status: DC

## 2011-09-29 NOTE — Patient Instructions (Signed)
F/u Sept 9 or after.  Please call if you need me before  Fasting lipid, cmp, HBA1C Sept 5 or after.  Blood pressure is adequately controlled, no med change  It is important that you exercise regularly at least 30 minutes 5 times a week. If you develop chest pain, have severe difficulty breathing, or feel very tired, stop exercising immediately and seek medical attention    A healthy diet is rich in fruit, vegetables and whole grains. Poultry fish, nuts and beans are a healthy choice for protein rather then red meat. A low sodium diet and drinking 64 ounces of water daily is generally recommended. Oils and sweet should be limited. Carbohydrates especially for those who are diabetic or overweight, should be limited to 30-45 gram per meal. It is important to eat on a regular schedule, at least 3 times daily. Snacks should be primarily fruits, vegetables or nuts.

## 2011-09-29 NOTE — Progress Notes (Signed)
  Subjective:    Patient ID: Wanda Ross, female    DOB: April 19, 1951, 61 y.o.   MRN: [id number]  HPI The PT is here for follow up primarily of uncontrolled hypertension  and re-evaluation of chronic medical conditions, medication management and review of any available recent lab and radiology data.  Preventive health is updated, specifically  Cancer screening and Immunization.   Questions or concerns regarding consultations or procedures which the PT has had in the interim are  addressed. The PT denies any adverse reactions to current medications since the last visit.  There are no new concerns.  There are no specific complaints . She has brought her meter from home, BP control is excellent, her meter is accurate. In the office she does get anxious which adversely affects the systolic pressure      Review of Systems See HPI Denies recent fever or chills. Denies sinus pressure, nasal congestion, ear pain or sore throat. Denies chest congestion, productive cough or wheezing. Denies chest pains, palpitations and leg swelling Denies abdominal pain, nausea, vomiting,diarrhea or constipation.   Denies dysuria, frequency, hesitancy or incontinence. Denies joint pain, swelling and limitation in mobility. Denies headaches, seizures, numbness, or tingling. Denies depression, anxiety or insomnia. Denies skin break down or rash.        Objective:   Physical Exam  Patient alert and oriented and in no cardiopulmonary distress.  HEENT: No facial asymmetry, EOMI, no sinus tenderness,  oropharynx pink and moist.  Neck supple no adenopathy.  Chest: Clear to auscultation bilaterally.  CVS: S1, S2 no murmurs, no S3.  ABD: Soft non tender. Bowel sounds normal.  Ext: No edema  MS: Adequate ROM spine, shoulders, hips and knees.  Skin: Intact, no ulcerations or rash noted.  Psych: Good eye contact, normal affect. Memory intact not anxious or depressed appearing.  CNS: CN 2-12  intact, power, tone and sensation normal throughout.       Assessment & Plan:

## 2011-09-30 NOTE — Assessment & Plan Note (Signed)
Needs updated imaging, last was in 2009, will order [place]

## 2011-09-30 NOTE — Assessment & Plan Note (Signed)
Pt following carb restricted diet , with exercise and weight loss. HBA1C at next visit

## 2011-09-30 NOTE — Assessment & Plan Note (Signed)
low HDL, recommitment to exercise stressed

## 2011-09-30 NOTE — Assessment & Plan Note (Signed)
Controlled, no med change, systolic slightly elevated in the office, which is due to white coat syndrome

## 2011-10-02 ENCOUNTER — Telehealth: Payer: Self-pay | Admitting: Family Medicine

## 2011-10-03 ENCOUNTER — Other Ambulatory Visit: Payer: Self-pay | Admitting: Family Medicine

## 2011-10-03 ENCOUNTER — Ambulatory Visit (HOSPITAL_COMMUNITY)
Admission: RE | Admit: 2011-10-03 | Discharge: 2011-10-03 | Disposition: A | Discharge status: Home / Self Care | Payer: BC Managed Care – PPO | Source: Ambulatory Visit | Attending: Family Medicine | Admitting: Family Medicine

## 2011-10-03 DIAGNOSIS — E042 Nontoxic multinodular goiter: Secondary | ICD-10-CM

## 2011-10-03 DIAGNOSIS — E041 Nontoxic single thyroid nodule: Secondary | ICD-10-CM

## 2011-10-03 DIAGNOSIS — E049 Nontoxic goiter, unspecified: Secondary | ICD-10-CM | POA: Insufficient documentation

## 2011-10-04 NOTE — Telephone Encounter (Signed)
Patient is aware 

## 2011-10-18 ENCOUNTER — Other Ambulatory Visit (INDEPENDENT_AMBULATORY_CARE_PROVIDER_SITE_OTHER): Payer: Self-pay | Admitting: Otolaryngology

## 2011-10-18 DIAGNOSIS — E041 Nontoxic single thyroid nodule: Secondary | ICD-10-CM

## 2011-10-26 ENCOUNTER — Inpatient Hospital Stay (HOSPITAL_COMMUNITY): Admission: RE | Admit: 2011-10-26 | Discharge: 2011-10-26 | Payer: BC Managed Care – PPO | Source: Ambulatory Visit

## 2011-10-26 ENCOUNTER — Ambulatory Visit (HOSPITAL_COMMUNITY)
Admission: RE | Admit: 2011-10-26 | Discharge: 2011-10-26 | Disposition: A | Discharge status: Home / Self Care | Payer: BC Managed Care – PPO | Source: Ambulatory Visit | Attending: Otolaryngology | Admitting: Otolaryngology

## 2011-10-26 ENCOUNTER — Other Ambulatory Visit (HOSPITAL_COMMUNITY): Payer: Self-pay | Admitting: Otolaryngology

## 2011-10-26 DIAGNOSIS — E041 Nontoxic single thyroid nodule: Secondary | ICD-10-CM

## 2011-10-26 NOTE — Progress Notes (Signed)
Procedure for right ultrasound guided thyroid biopsy; patient in room at 1000; timeout at 1022; start procedure at 1025, injected 2 ml of lidocaine; 3 samples obtained from right side, ended at 1030

## 2011-10-26 NOTE — Discharge Instructions (Signed)
Thyroid Biopsy The thyroid gland is a butterfly-shaped gland situated in the front of the neck. It produces hormones which affect metabolism, growth and development, and body temperature. A thyroid biopsy is a procedure in which small samples of tissue or fluid are removed from the thyroid gland or mass and examined under a microscope. This test is done to determine the cause of thyroid problems, such as infection, cancer, or other thyroid problems. There are 2 ways to obtain samples: 1. Fine needle biopsy. Samples are removed using a thin needle inserted through the skin and into the thyroid gland or mass.  2. Open biopsy. Samples are removed after a cut (incision) is made through the skin.  LET YOUR CAREGIVER KNOW ABOUT:   Allergies.   Medications taken including herbs, eye drops, over-the-counter medications, and creams.   Use of steroids (by mouth or creams).   Previous problems with anesthetics or numbing medicine.   Possibility of pregnancy, if this applies.   History of blood clots (thrombophlebitis).   History of bleeding or blood problems.   Previous surgery.   Other health problems.  RISKS AND COMPLICATIONS  Bleeding from the site. The risk of bleeding is higher if you have a bleeding disorder or are taking any blood thinning medications (anticoagulants).   Infection.   Injury to structures near the thyroid gland.  BEFORE THE PROCEDURE  This is a procedure that can be done as an outpatient. Confirm the time that you need to arrive for your procedure. Confirm whether there is a need to fast or withhold any medications. A blood sample may be done to determine your blood clotting time. Medicine may be given to help you relax (sedative). PROCEDURE Fine needle biopsy. You will be awake during the procedure. You may be asked to lie on your back with your head tipped backward to extend your neck. Let your caregiver know if you cannot tolerate the positioning. An area on your  neck will be cleansed. A needle is inserted through the skin of your neck. You may feel a mild discomfort during this procedure. You may be asked to avoid coughing, talking, swallowing, or making sounds during some portions of the procedure. The needle is withdrawn once tissue or fluid samples have been removed. Pressure may be applied to the neck to reduce swelling and ensure that bleeding has stopped. The samples will be sent for examination.  Open biopsy. You will be given general anesthesia. You will be asleep during the procedure. An incision is made in your neck. A sample of thyroid tissue or the mass is removed. The tissue sample or mass will be sent for examination. The sample or mass may be examined during the biopsy. If the sample or mass contains cancer cells, some or all of the thyroid gland may be removed. The incision is closed with stitches. AFTER THE PROCEDURE  Your recovery will be assessed and monitored. If there are no problems, as an outpatient, you should be able to go home shortly after the procedure. If you had a fine needle biopsy:  You may have soreness at the biopsy site for 1 to 2 days.  If you had an open biopsy:   You may have soreness at the biopsy site for 3 to 4 days.   You may have a hoarse voice or sore throat for 1 to 2 days.  Obtaining the Test Results It is your responsibility to obtain your test results. Do not assume everything is normal if you have   not heard from your caregiver or the medical facility. It is important for you to follow up on all of your test results. HOME CARE INSTRUCTIONS   Keeping your head raised on a pillow when you are lying down may ease biopsy site discomfort.   Supporting the back of your head and neck with both hands as you sit up from a lying position may ease biopsy site discomfort.   Only take over-the-counter or prescription medicines for pain, discomfort, or fever as directed by your caregiver.   Throat lozenges or gargling  with warm salt water may help to soothe a sore throat.  SEEK IMMEDIATE MEDICAL CARE IF:   You have severe bleeding from the biopsy site.   You have difficulty swallowing.   You have a fever.   You have increased pain, swelling, redness, or warmth at the biopsy site.   You notice pus coming from the biopsy site.   You have swollen glands (lymph nodes) in your neck.  Document Released: 03/12/2007 Document Revised: 05/04/2011 Document Reviewed: 08/12/2008 [employer] Patient Information 2012 [employer]. 

## 2011-11-02 ENCOUNTER — Telehealth: Payer: Self-pay | Admitting: Family Medicine

## 2011-11-08 NOTE — Telephone Encounter (Signed)
No results noted.  

## 2012-02-06 ENCOUNTER — Other Ambulatory Visit: Payer: Self-pay | Admitting: Family Medicine

## 2012-02-06 LAB — LIPID PANEL
Cholesterol: 123 mg/dL (ref 0–200)
HDL: 43 mg/dL (ref >39)
LDL Cholesterol: 58 mg/dL (ref 0–99)
Total CHOL/HDL Ratio: 2.9 Ratio
Triglycerides: 108 mg/dL (ref <150)
VLDL: 22 mg/dL (ref 0–40)

## 2012-02-06 LAB — HEMOGLOBIN A1C
Hgb A1c MFr Bld: 5.9 % — ABNORMAL HIGH (ref <5.7)
Mean Plasma Glucose: 123 mg/dL — ABNORMAL HIGH (ref <117)

## 2012-02-06 LAB — COMPREHENSIVE METABOLIC PANEL
ALT: 19 U/L (ref 0–35)
AST: 28 U/L (ref 0–37)
Albumin: 4.7 g/dL (ref 3.5–5.2)
Alkaline Phosphatase: 74 U/L (ref 39–117)
BUN: 21 mg/dL (ref 6–23)
CO2: 31 mEq/L (ref 19–32)
Calcium: 9.9 mg/dL (ref 8.4–10.5)
Chloride: 102 mEq/L (ref 96–112)
Creat: 0.76 mg/dL (ref 0.50–1.10)
Glucose, Bld: 103 mg/dL — ABNORMAL HIGH (ref 70–99)
Potassium: 3.9 mEq/L (ref 3.5–5.3)
Sodium: 141 mEq/L (ref 135–145)
Total Bilirubin: 0.6 mg/dL (ref 0.3–1.2)
Total Protein: 7.3 g/dL (ref 6.0–8.3)

## 2012-02-08 ENCOUNTER — Encounter: Payer: Self-pay | Admitting: Family Medicine

## 2012-02-08 ENCOUNTER — Ambulatory Visit (INDEPENDENT_AMBULATORY_CARE_PROVIDER_SITE_OTHER): Payer: BC Managed Care – PPO | Admitting: Family Medicine

## 2012-02-08 VITALS — BP 118/80 | HR 68 | Resp 16 | Ht 68.0 in | Wt 160.8 lb

## 2012-02-08 DIAGNOSIS — Z23 Encounter for immunization: Secondary | ICD-10-CM

## 2012-02-08 DIAGNOSIS — E785 Hyperlipidemia, unspecified: Secondary | ICD-10-CM

## 2012-02-08 DIAGNOSIS — E739 Lactose intolerance, unspecified: Secondary | ICD-10-CM

## 2012-02-08 DIAGNOSIS — I10 Essential (primary) hypertension: Secondary | ICD-10-CM

## 2012-02-08 MED ORDER — VALSARTAN-HYDROCHLOROTHIAZIDE 320-25 MG PO TABS: ORAL_TABLET | ORAL | Status: DC

## 2012-02-08 NOTE — Patient Instructions (Addendum)
F/u in 6 month, please call if you need me before.  Flu vaccine today  Excellent blood pressure and labs today, keep it up.  We will provide the 15gm info sheet.  Keep up the healthy habits  It is important that you exercise regularly at least 60 minutes 5 times a week. If you develop chest pain, have severe difficulty breathing, or feel very tired, stop exercising immediately and seek medical attention   A healthy diet is rich in fruit, vegetables and whole grains. Poultry fish, nuts and beans are a healthy choice for protein rather then red meat. A low sodium diet and drinking 64 ounces of water daily is generally recommended. Oils and sweet should be limited. Carbohydrates especially for those who are diabetic or overweight, should be limited to 30-45 gram per meal. It is important to eat on a regular schedule, at least 3 times daily. Snacks should be primarily fruits, vegetables or nuts.  Fasting lipid, cmp and HBa1C in 6 month

## 2012-02-08 NOTE — Progress Notes (Signed)
  Subjective:    Patient ID: Wanda Ross, female    DOB: 1951/04/27, 61 y.o.   MRN: [id number]  HPI The PT is here for follow up and re-evaluation of chronic medical conditions, medication management and review of any available recent lab and radiology data.  Preventive health is updated, specifically  Cancer screening and Immunization.   Questions or concerns regarding consultations or procedures which the PT has had in the interim are  Addressed.Has upcoming appt with opthalmologist she is a glaucoma suspect The PT denies any adverse reactions to current medications since the last visit.  There are no new concerns.  There are no specific complaints  Commited to walking and logging her miles, walks on avg 6 miles per day.     Review of Systems See HPI Denies recent fever or chills. Denies sinus pressure, nasal congestion, ear pain or sore throat. Denies chest congestion, productive cough or wheezing. Denies chest pains, palpitations and leg swelling Denies abdominal pain, nausea, vomiting,diarrhea or constipation.   Denies dysuria, frequency, hesitancy or incontinence. Denies joint pain, swelling and limitation in mobility. Denies headaches, seizures, numbness, or tingling. Denies depression, anxiety or insomnia. Denies skin break down or rash.        Objective:   Physical Exam Patient alert and oriented and in no cardiopulmonary distress.  HEENT: No facial asymmetry, EOMI, no sinus tenderness,  oropharynx pink and moist.  Neck supple no adenopathy.  Chest: Clear to auscultation bilaterally.  CVS: S1, S2 no murmurs, no S3.  ABD: Soft non tender. Bowel sounds normal.  Ext: No edema  MS: Adequate ROM spine, shoulders, hips and knees.  Skin: Intact, no ulcerations or rash noted.  Psych: Good eye contact, normal affect. Memory intact not anxious or depressed appearing.  CNS: CN 2-12 intact, power, tone and sensation normal throughout.        Assessment &  Plan:

## 2012-02-12 NOTE — Assessment & Plan Note (Signed)
Improved, low car diet encouraged and daily exercise

## 2012-02-12 NOTE — Assessment & Plan Note (Signed)
Controlled, no change in medication Hyperlipidemia:Low fat diet discussed and encouraged.  \ 

## 2012-02-12 NOTE — Assessment & Plan Note (Signed)
Controlled, no change in medication DASH diet and commitment to daily physical activity for a minimum of 30 minutes discussed and encouraged, as a part of hypertension management. The importance of attaining a healthy weight is also discussed.  

## 2012-03-11 ENCOUNTER — Other Ambulatory Visit: Payer: Self-pay | Admitting: Family Medicine

## 2012-05-24 ENCOUNTER — Other Ambulatory Visit: Payer: Self-pay | Admitting: Family Medicine

## 2012-05-24 DIAGNOSIS — Z139 Encounter for screening, unspecified: Secondary | ICD-10-CM

## 2012-05-30 ENCOUNTER — Ambulatory Visit (HOSPITAL_COMMUNITY)
Admission: RE | Admit: 2012-05-30 | Discharge: 2012-05-30 | Disposition: A | Discharge status: Home / Self Care | Payer: BC Managed Care – PPO | Source: Ambulatory Visit | Attending: Family Medicine | Admitting: Family Medicine

## 2012-05-30 DIAGNOSIS — Z139 Encounter for screening, unspecified: Secondary | ICD-10-CM

## 2012-05-30 DIAGNOSIS — Z1231 Encounter for screening mammogram for malignant neoplasm of breast: Secondary | ICD-10-CM | POA: Insufficient documentation

## 2012-08-05 ENCOUNTER — Other Ambulatory Visit: Payer: Self-pay | Admitting: Family Medicine

## 2012-08-07 ENCOUNTER — Ambulatory Visit: Payer: BC Managed Care – PPO | Admitting: Family Medicine

## 2012-08-22 ENCOUNTER — Other Ambulatory Visit: Payer: Self-pay | Admitting: Family Medicine

## 2012-08-23 LAB — COMPREHENSIVE METABOLIC PANEL
ALT: 19 U/L (ref 0–35)
AST: 23 U/L (ref 0–37)
Albumin: 4.7 g/dL (ref 3.5–5.2)
Alkaline Phosphatase: 60 U/L (ref 39–117)
BUN: 17 mg/dL (ref 6–23)
CO2: 33 mEq/L — ABNORMAL HIGH (ref 19–32)
Calcium: 9.8 mg/dL (ref 8.4–10.5)
Chloride: 104 mEq/L (ref 96–112)
Creat: 0.82 mg/dL (ref 0.50–1.10)
Glucose, Bld: 106 mg/dL — ABNORMAL HIGH (ref 70–99)
Potassium: 4 mEq/L (ref 3.5–5.3)
Sodium: 142 mEq/L (ref 135–145)
Total Bilirubin: 0.5 mg/dL (ref 0.3–1.2)
Total Protein: 7.1 g/dL (ref 6.0–8.3)

## 2012-08-23 LAB — LIPID PANEL
Cholesterol: 119 mg/dL (ref 0–200)
HDL: 39 mg/dL — ABNORMAL LOW (ref >39)
LDL Cholesterol: 64 mg/dL (ref 0–99)
Total CHOL/HDL Ratio: 3.1 Ratio
Triglycerides: 79 mg/dL (ref <150)
VLDL: 16 mg/dL (ref 0–40)

## 2012-08-23 LAB — HEMOGLOBIN A1C
Hgb A1c MFr Bld: 5.9 % — ABNORMAL HIGH (ref <5.7)
Mean Plasma Glucose: 123 mg/dL — ABNORMAL HIGH (ref <117)

## 2012-08-26 ENCOUNTER — Encounter: Payer: Self-pay | Admitting: Family Medicine

## 2012-08-26 ENCOUNTER — Ambulatory Visit (INDEPENDENT_AMBULATORY_CARE_PROVIDER_SITE_OTHER): Payer: BC Managed Care – PPO | Admitting: Family Medicine

## 2012-08-26 VITALS — BP 160/100 | HR 86 | Resp 18 | Ht 68.0 in | Wt 159.0 lb

## 2012-08-26 DIAGNOSIS — E739 Lactose intolerance, unspecified: Secondary | ICD-10-CM

## 2012-08-26 DIAGNOSIS — E785 Hyperlipidemia, unspecified: Secondary | ICD-10-CM

## 2012-08-26 DIAGNOSIS — I10 Essential (primary) hypertension: Secondary | ICD-10-CM

## 2012-08-26 MED ORDER — CLONIDINE HCL 0.2 MG PO TABS: ORAL_TABLET | ORAL | Status: DC

## 2012-08-26 NOTE — Patient Instructions (Addendum)
F/u in mid  May, call if you need me before  Dose increase on clonidine to 0.2 mg at bedtime, ok to take TWO 0.1mg  tablets at bedtime  Till done You will be given the new script to take in when you need to fill it   Blood work is excellent, no med change, continue daily walking to increase good cholesterol which is slightly low, as well as to maintain good blood sugars.

## 2012-08-28 ENCOUNTER — Other Ambulatory Visit: Payer: Self-pay

## 2012-08-28 MED ORDER — VALSARTAN-HYDROCHLOROTHIAZIDE 320-25 MG PO TABS: ORAL_TABLET | ORAL | Status: DC

## 2012-09-01 NOTE — Assessment & Plan Note (Signed)
Unchnaged. Patient educated about the importance of limiting  Carbohydrate intake , the need to commit to daily physical activity for a minimum of 30 minutes , and to commit weight loss. The fact that changes in all these areas will reduce or eliminate all together the development of diabetes is stressed.

## 2012-09-01 NOTE — Assessment & Plan Note (Signed)
Hyperlipidemia:Low fat diet discussed and encouraged.  Controlled, no change in medication Pt to continue daily walking

## 2012-09-01 NOTE — Progress Notes (Signed)
  Subjective:    Patient ID: Wanda Ross, female    DOB: 1950/10/30, 62 y.o.   MRN: [id number]  HPI The PT is here for follow up and re-evaluation of chronic medical conditions, medication management and review of any available recent lab and radiology data.  Preventive health is updated, specifically  Cancer screening and Immunization.   Questions or concerns regarding consultations or procedures which the PT has had in the interim are  addressed. The PT denies any adverse reactions to current medications since the last visit.  There are no new concerns.  There are no specific complaints       Review of Systems See HPI Denies recent fever or chills. Denies sinus pressure, nasal congestion, ear pain or sore throat. Denies chest congestion, productive cough or wheezing. Denies chest pains, palpitations and leg swelling Denies abdominal pain, nausea, vomiting,diarrhea or constipation.   Denies dysuria, frequency, hesitancy or incontinence. Denies joint pain, swelling and limitation in mobility. Denies headaches, seizures, numbness, or tingling. Denies depression, anxiety or insomnia. Denies skin break down or rash.        Objective:   Physical Exam Patient alert and oriented and in no cardiopulmonary distress.  HEENT: No facial asymmetry, EOMI, no sinus tenderness,  oropharynx pink and moist.  Neck supple no adenopathy.  Chest: Clear to auscultation bilaterally.  CVS: S1, S2 no murmurs, no S3.  ABD: Soft non tender. Bowel sounds normal.  Ext: No edema  MS: Adequate ROM spine, shoulders, hips and knees.  Skin: Intact, no ulcerations or rash noted.  Psych: Good eye contact, normal affect. Memory intact not anxious or depressed appearing.  CNS: CN 2-12 intact, power, tone and sensation normal throughout.        Assessment & Plan:

## 2012-09-01 NOTE — Assessment & Plan Note (Signed)
Uncontrolled, dose increase on clonidine DASH diet and commitment to daily physical activity for a minimum of 30 minutes discussed and encouraged, as a part of hypertension management. The importance of attaining a healthy weight is also discussed.

## 2012-09-14 ENCOUNTER — Other Ambulatory Visit: Payer: Self-pay | Admitting: Family Medicine

## 2012-10-07 ENCOUNTER — Encounter: Payer: Self-pay | Admitting: Family Medicine

## 2012-10-07 ENCOUNTER — Ambulatory Visit (INDEPENDENT_AMBULATORY_CARE_PROVIDER_SITE_OTHER): Payer: BC Managed Care – PPO | Admitting: Family Medicine

## 2012-10-07 VITALS — BP 150/90 | HR 98 | Resp 16 | Wt 152.0 lb

## 2012-10-07 DIAGNOSIS — E739 Lactose intolerance, unspecified: Secondary | ICD-10-CM

## 2012-10-07 DIAGNOSIS — I10 Essential (primary) hypertension: Secondary | ICD-10-CM

## 2012-10-07 DIAGNOSIS — E785 Hyperlipidemia, unspecified: Secondary | ICD-10-CM

## 2012-10-07 NOTE — Patient Instructions (Addendum)
F/u in 5 month Call if you need me before  No change in medication.  Keep active and continue to follow a healthy diet  Fasting lipid, cmp, HBA1C, CBC and TSH in 5.5 month, before visit

## 2012-10-07 NOTE — Assessment & Plan Note (Signed)
Updated lab in 5 month Patient educated about the importance of limiting  Carbohydrate intake , the need to commit to daily physical activity for a minimum of 30 minutes , and to commit weight loss. The fact that changes in all these areas will reduce or eliminate all together the development of diabetes is stressed.

## 2012-10-07 NOTE — Assessment & Plan Note (Signed)
Hyperlipidemia:Low fat diet discussed and encouraged.  Updated lab in 5 month

## 2012-10-07 NOTE — Addendum Note (Signed)
Addended by: Kandis Fantasia B on: 10/07/2012 08:33 AM   Modules accepted: Orders

## 2012-10-07 NOTE — Progress Notes (Signed)
  Subjective:    Patient ID: Wanda Ross, female    DOB: [birth year], 62 y.o.   MRN: [id number]  HPI The PT is here for follow up and re-evaluation of chronic medical conditions,specifically uncontrolled hypertension medication management and review of any available recent lab and radiology data.  Preventive health is updated, specifically  Cancer screening and Immunization.   Questions or concerns regarding consultations or procedures which the PT has had in the interim are  addressed. The PT denies any adverse reactions to current medications since the last visit.  There are no new concerns.  There are no specific complaints  Pt has white coat syndrome, her BP cuff is reliable as evidenced in the office today She has lost weight and is comited to daily exercise. Numbers at home are all less than 140/90 and are normal, she is not light headed      Review of Systems See HPI Denies recent fever or chills. Denies sinus pressure, nasal congestion, ear pain or sore throat. Denies chest congestion, productive cough or wheezing. Denies chest pains, palpitations and leg swelling Denies abdominal pain, nausea, vomiting,diarrhea or constipation.   Denies dysuria, frequency, hesitancy or incontinence. Denies joint pain, swelling and limitation in mobility. Denies headaches, seizures, numbness, or tingling. Denies depression, anxiety or insomnia. Denies skin break down or rash.        Objective:   Physical Exam Patient alert and oriented and in no cardiopulmonary distress.  HEENT: No facial asymmetry, EOMI, no sinus tenderness,  oropharynx pink and moist.  Neck supple no adenopathy.  Chest: Clear to auscultation bilaterally.  CVS: S1, S2 no murmurs, no S3.  ABD: Soft non tender. Bowel sounds normal.  Ext: No edema  MS: Adequate ROM spine, shoulders, hips and knees.  Skin: Intact, no ulcerations or rash noted.  Psych: Good eye contact, normal affect. Memory intact not  anxious or depressed appearing.  CNS: CN 2-12 intact, power, tone and sensation normal throughout.        Assessment & Plan:

## 2012-10-07 NOTE — Assessment & Plan Note (Signed)
Still elevated at this visit. However this is due to white coat syndrome, no med change DASH diet and commitment to daily physical activity for a minimum of 30 minutes discussed and encouraged, as a part of hypertension management. The importance of attaining a healthy weight is also discussed.

## 2012-10-30 ENCOUNTER — Other Ambulatory Visit (INDEPENDENT_AMBULATORY_CARE_PROVIDER_SITE_OTHER): Payer: Self-pay | Admitting: Otolaryngology

## 2012-10-30 DIAGNOSIS — E079 Disorder of thyroid, unspecified: Secondary | ICD-10-CM

## 2012-11-05 ENCOUNTER — Ambulatory Visit (HOSPITAL_COMMUNITY)
Admission: RE | Admit: 2012-11-05 | Discharge: 2012-11-05 | Disposition: A | Discharge status: Home / Self Care | Payer: BC Managed Care – PPO | Source: Ambulatory Visit | Attending: Otolaryngology | Admitting: Otolaryngology

## 2012-11-05 DIAGNOSIS — E041 Nontoxic single thyroid nodule: Secondary | ICD-10-CM | POA: Insufficient documentation

## 2012-11-05 DIAGNOSIS — E079 Disorder of thyroid, unspecified: Secondary | ICD-10-CM | POA: Insufficient documentation

## 2012-11-14 ENCOUNTER — Ambulatory Visit (INDEPENDENT_AMBULATORY_CARE_PROVIDER_SITE_OTHER): Payer: BC Managed Care – PPO | Admitting: Otolaryngology

## 2012-11-14 DIAGNOSIS — D449 Neoplasm of uncertain behavior of unspecified endocrine gland: Secondary | ICD-10-CM

## 2012-12-13 ENCOUNTER — Other Ambulatory Visit: Payer: Self-pay | Admitting: Family Medicine

## 2013-03-11 ENCOUNTER — Ambulatory Visit: Payer: BC Managed Care – PPO | Admitting: Family Medicine

## 2013-03-14 ENCOUNTER — Other Ambulatory Visit: Payer: Self-pay | Admitting: Family Medicine

## 2013-03-14 LAB — COMPREHENSIVE METABOLIC PANEL
ALT: 15 U/L (ref 0–35)
AST: 22 U/L (ref 0–37)
Albumin: 4.6 g/dL (ref 3.5–5.2)
Alkaline Phosphatase: 64 U/L (ref 39–117)
BUN: 15 mg/dL (ref 6–23)
CO2: 32 mEq/L (ref 19–32)
Calcium: 9.9 mg/dL (ref 8.4–10.5)
Chloride: 103 mEq/L (ref 96–112)
Creat: 0.73 mg/dL (ref 0.50–1.10)
Glucose, Bld: 102 mg/dL — ABNORMAL HIGH (ref 70–99)
Potassium: 4 mEq/L (ref 3.5–5.3)
Sodium: 143 mEq/L (ref 135–145)
Total Bilirubin: 0.6 mg/dL (ref 0.3–1.2)
Total Protein: 7.1 g/dL (ref 6.0–8.3)

## 2013-03-14 LAB — CBC
HCT: 42.6 % (ref 36.0–46.0)
Hemoglobin: 14.8 g/dL (ref 12.0–15.0)
MCH: 29.2 pg (ref 26.0–34.0)
MCHC: 34.7 g/dL (ref 30.0–36.0)
MCV: 84 fL (ref 78.0–100.0)
Platelets: 316 10*3/uL (ref 150–400)
RBC: 5.07 MIL/uL (ref 3.87–5.11)
RDW: 13.8 % (ref 11.5–15.5)
WBC: 5.8 10*3/uL (ref 4.0–10.5)

## 2013-03-14 LAB — TSH: TSH: 3.369 u[IU]/mL (ref 0.350–4.500)

## 2013-03-14 LAB — LIPID PANEL
Cholesterol: 114 mg/dL (ref 0–200)
HDL: 40 mg/dL (ref >39)
LDL Cholesterol: 47 mg/dL (ref 0–99)
Total CHOL/HDL Ratio: 2.9 Ratio
Triglycerides: 137 mg/dL (ref <150)
VLDL: 27 mg/dL (ref 0–40)

## 2013-03-14 LAB — HEMOGLOBIN A1C
Hgb A1c MFr Bld: 5.8 % — ABNORMAL HIGH (ref <5.7)
Mean Plasma Glucose: 120 mg/dL — ABNORMAL HIGH (ref <117)

## 2013-03-19 ENCOUNTER — Encounter: Payer: Self-pay | Admitting: Family Medicine

## 2013-03-19 ENCOUNTER — Ambulatory Visit (INDEPENDENT_AMBULATORY_CARE_PROVIDER_SITE_OTHER): Payer: BC Managed Care – PPO | Admitting: Family Medicine

## 2013-03-19 ENCOUNTER — Encounter (INDEPENDENT_AMBULATORY_CARE_PROVIDER_SITE_OTHER): Payer: Self-pay

## 2013-03-19 VITALS — BP 120/80 | HR 70 | Resp 16 | Ht 68.0 in | Wt 148.4 lb

## 2013-03-19 DIAGNOSIS — Z23 Encounter for immunization: Secondary | ICD-10-CM

## 2013-03-19 DIAGNOSIS — I10 Essential (primary) hypertension: Secondary | ICD-10-CM

## 2013-03-19 DIAGNOSIS — E785 Hyperlipidemia, unspecified: Secondary | ICD-10-CM

## 2013-03-19 DIAGNOSIS — E739 Lactose intolerance, unspecified: Secondary | ICD-10-CM

## 2013-03-19 MED ORDER — VALSARTAN-HYDROCHLOROTHIAZIDE 320-25 MG PO TABS: ORAL_TABLET | ORAL | Status: DC

## 2013-03-19 NOTE — Patient Instructions (Addendum)
Welcome to medicare in 5 month, call if you need me before  Flu vaccine today  Blood pressure, and labs are excellent, continue current medications and excellent lifestyle.  Keep busy, and happy birthday when it comes  Fasting lipid, cmp and HBa1C in 5 month  Reduce lipid medication from 4 times weekly to twice weekly

## 2013-03-19 NOTE — Assessment & Plan Note (Signed)
Improved and controlled, reduce med to twice weekly from 4 times weekly Hyperlipidemia:Low fat diet discussed and encouraged.

## 2013-03-19 NOTE — Progress Notes (Signed)
  Subjective:    Patient ID: Wanda Ross, female    DOB: 1950-12-03, 62 y.o.   MRN: [id number]  HPI The PT is here for follow up and re-evaluation of chronic medical conditions, medication management and review of any available recent lab and radiology data.  Preventive health is updated, specifically  Cancer screening and Immunization.   Questions or concerns regarding consultations or procedures which the PT has had in the interim are  addressed. The PT denies any adverse reactions to current medications since the last visit.  There are no new concerns.  There are no specific complaints       Review of Systems See HPI Denies recent fever or chills. Denies sinus pressure, nasal congestion, ear pain or sore throat. Denies chest congestion, productive cough or wheezing. Denies chest pains, palpitations and leg swelling Denies abdominal pain, nausea, vomiting,diarrhea or constipation.   Denies dysuria, frequency, hesitancy or incontinence. Denies joint pain, swelling and limitation in mobility. Denies headaches, seizures, numbness, or tingling. Denies depression, anxiety or insomnia. Denies skin break down or rash.        Objective:   Physical Exam  Patient alert and oriented and in no cardiopulmonary distress.  HEENT: No facial asymmetry, EOMI, no sinus tenderness,  oropharynx pink and moist.  Neck supple no adenopathy.  Chest: Clear to auscultation bilaterally.  CVS: S1, S2 no murmurs, no S3.  ABD: Soft non tender. Bowel sounds normal.  Ext: No edema  MS: Adequate ROM spine, shoulders, hips and knees.  Skin: Intact, no ulcerations or rash noted.  Psych: Good eye contact, normal affect. Memory intact not anxious or depressed appearing.  CNS: CN 2-12 intact, power, tone and sensation normal throughout.       Assessment & Plan:

## 2013-03-19 NOTE — Assessment & Plan Note (Signed)
Improved, 5.8 in October Patient educated about the importance of limiting  Carbohydrate intake , the need to commit to daily physical activity for a minimum of 30 minutes , and to commit weight loss. The fact that changes in all these areas will reduce or eliminate all together the development of diabetes is stressed.

## 2013-03-19 NOTE — Assessment & Plan Note (Signed)
Controlled, no change in medication DASH diet and commitment to daily physical activity for a minimum of 30 minutes discussed and encouraged, as a part of hypertension management. The importance of maintaining  a healthy weight is also discussed. She is already very comited to this

## 2013-05-27 ENCOUNTER — Other Ambulatory Visit: Payer: Self-pay | Admitting: Family Medicine

## 2013-05-27 DIAGNOSIS — Z139 Encounter for screening, unspecified: Secondary | ICD-10-CM

## 2013-06-02 ENCOUNTER — Ambulatory Visit (HOSPITAL_COMMUNITY)
Admission: RE | Admit: 2013-06-02 | Discharge: 2013-06-02 | Disposition: A | Discharge status: Home / Self Care | Payer: Medicare Other | Source: Ambulatory Visit | Attending: Family Medicine | Admitting: Family Medicine

## 2013-06-02 DIAGNOSIS — Z1231 Encounter for screening mammogram for malignant neoplasm of breast: Secondary | ICD-10-CM | POA: Insufficient documentation

## 2013-06-02 DIAGNOSIS — Z139 Encounter for screening, unspecified: Secondary | ICD-10-CM

## 2013-07-14 ENCOUNTER — Ambulatory Visit (INDEPENDENT_AMBULATORY_CARE_PROVIDER_SITE_OTHER): Payer: Medicare Other | Admitting: Obstetrics & Gynecology

## 2013-07-14 ENCOUNTER — Encounter: Payer: Self-pay | Admitting: Obstetrics & Gynecology

## 2013-07-14 ENCOUNTER — Other Ambulatory Visit (HOSPITAL_COMMUNITY)
Admission: RE | Admit: 2013-07-14 | Discharge: 2013-07-14 | Disposition: A | Discharge status: Home / Self Care | Payer: Medicare Other | Source: Ambulatory Visit | Attending: Obstetrics & Gynecology | Admitting: Obstetrics & Gynecology

## 2013-07-14 VITALS — Ht 68.0 in | Wt 148.0 lb

## 2013-07-14 DIAGNOSIS — Z01419 Encounter for gynecological examination (general) (routine) without abnormal findings: Secondary | ICD-10-CM

## 2013-07-14 DIAGNOSIS — Z124 Encounter for screening for malignant neoplasm of cervix: Secondary | ICD-10-CM | POA: Insufficient documentation

## 2013-07-14 NOTE — Progress Notes (Signed)
Patient ID: Wanda Ross, female   DOB: 1950-07-06, 63 y.o.   MRN: [id number] Subjective:     Wanda Ross is a 63 y.o. female here for a routine exam.  No LMP recorded. Patient is postmenopausal. No obstetric history on file. Birth Control Method:  na Menstrual Calendar(currently): na  Current complaints: none.   Current acute medical issues:  none   Recent Gynecologic History No LMP recorded. Patient is postmenopausal. Last Pap: 2014,  normal Last mammogram: 2014,  normal  Past Medical History  Diagnosis Date  . Multinodular goiter   . Hyperlipidemia   . Hypertension     History reviewed. No pertinent past surgical history.  OB History   Grav Para Term Preterm Abortions TAB SAB Ect Mult Living                  History   Social History  . Marital Status: Widowed    Spouse Name: N/A    Number of Children: 3  . Years of Education: N/A   Occupational History  . employed     Social History Main Topics  . Smoking status: Never Smoker   . Smokeless tobacco: None  . Alcohol Use: No  . Drug Use: No  . Sexual Activity: None   Other Topics Concern  . None   Social History Narrative  . None    Family History  Problem Relation Age of Onset  . Dementia Father   . Dementia Mother   . Diabetes Mother   . Hypertension Mother   . Cancer Sister     breast     Review of Systems  Review of Systems  Constitutional: Negative for fever, chills, weight loss, malaise/fatigue and diaphoresis.  HENT: Negative for hearing loss, ear pain, nosebleeds, congestion, sore throat, neck pain, tinnitus and ear discharge.   Eyes: Negative for blurred vision, double vision, photophobia, pain, discharge and redness.  Respiratory: Negative for cough, hemoptysis, sputum production, shortness of breath, wheezing and stridor.   Cardiovascular: Negative for chest pain, palpitations, orthopnea, claudication, leg swelling and PND.  Gastrointestinal: negative for abdominal pain.  Negative for heartburn, nausea, vomiting, diarrhea, constipation, blood in stool and melena.  Genitourinary: Negative for dysuria, urgency, frequency, hematuria and flank pain.  Musculoskeletal: Negative for myalgias, back pain, joint pain and falls.  Skin: Negative for itching and rash.  Neurological: Negative for dizziness, tingling, tremors, sensory change, speech change, focal weakness, seizures, loss of consciousness, weakness and headaches.  Endo/Heme/Allergies: Negative for environmental allergies and polydipsia. Does not bruise/bleed easily.  Psychiatric/Behavioral: Negative for depression, suicidal ideas, hallucinations, memory loss and substance abuse. The patient is not nervous/anxious and does not have insomnia.        Objective:    Physical Exam  Vitals reviewed. Constitutional: She is oriented to person, place, and time. She appears well-developed and well-nourished.  HENT:  Head: Normocephalic and atraumatic.        Right Ear: External ear normal.  Left Ear: External ear normal.  Nose: Nose normal.  Mouth/Throat: Oropharynx is clear and moist.  Eyes: Conjunctivae and EOM are normal. Pupils are equal, round, and reactive to light. Right eye exhibits no discharge. Left eye exhibits no discharge. No scleral icterus.  Neck: Normal range of motion. Neck supple. No tracheal deviation present. No thyromegaly present.  Cardiovascular: Normal rate, regular rhythm, normal heart sounds and intact distal pulses.  Exam reveals no gallop and no friction rub.   No murmur heard. Respiratory: Effort normal  and breath sounds normal. No respiratory distress. She has no wheezes. She has no rales. She exhibits no tenderness.  GI: Soft. Bowel sounds are normal. She exhibits no distension and no mass. There is no tenderness. There is no rebound and no guarding.  Genitourinary:  Breasts no masses skin changes or nipple changes bilaterally      Vulva is normal without lesions Vagina is pink moist  without discharge Cervix normal in appearance and pap is done Uterus is normal size shape and contour Adnexa is negative  Rectal    hemoccult negative, normal tone, no masses  Musculoskeletal: Normal range of motion. She exhibits no edema and no tenderness.  Neurological: She is alert and oriented to person, place, and time. She has normal reflexes. She displays normal reflexes. No cranial nerve deficit. She exhibits normal muscle tone. Coordination normal.  Skin: Skin is warm and dry. No rash noted. No erythema. No pallor.  Psychiatric: She has a normal mood and affect. Her behavior is normal. Judgment and thought content normal.       Assessment:    Healthy female exam.    Plan:    Follow up in: 1 year.

## 2013-07-14 NOTE — Addendum Note (Signed)
Addended by: Linton Rump on: 07/14/2013 11:23 AM   Modules accepted: Orders

## 2013-07-23 ENCOUNTER — Other Ambulatory Visit: Payer: Self-pay

## 2013-07-23 DIAGNOSIS — I10 Essential (primary) hypertension: Secondary | ICD-10-CM

## 2013-07-23 MED ORDER — VALSARTAN-HYDROCHLOROTHIAZIDE 320-25 MG PO TABS: ORAL_TABLET | ORAL | Status: DC

## 2013-07-23 MED ORDER — CLONIDINE HCL 0.2 MG PO TABS
ORAL_TABLET | ORAL | Status: DC
Start: 2013-07-23 — End: 2014-03-24

## 2013-07-26 ENCOUNTER — Other Ambulatory Visit: Payer: Self-pay | Admitting: Family Medicine

## 2013-08-15 LAB — COMPREHENSIVE METABOLIC PANEL
ALT: 13 U/L (ref 0–35)
AST: 17 U/L (ref 0–37)
Albumin: 4.8 g/dL (ref 3.5–5.2)
Alkaline Phosphatase: 58 U/L (ref 39–117)
BUN: 18 mg/dL (ref 6–23)
CO2: 30 mEq/L (ref 19–32)
Calcium: 10.3 mg/dL (ref 8.4–10.5)
Chloride: 102 mEq/L (ref 96–112)
Creat: 0.77 mg/dL (ref 0.50–1.10)
Glucose, Bld: 99 mg/dL (ref 70–99)
Potassium: 4 mEq/L (ref 3.5–5.3)
Sodium: 142 mEq/L (ref 135–145)
Total Bilirubin: 0.8 mg/dL (ref 0.2–1.2)
Total Protein: 7.5 g/dL (ref 6.0–8.3)

## 2013-08-15 LAB — HEMOGLOBIN A1C
Hgb A1c MFr Bld: 5.9 % — ABNORMAL HIGH (ref <5.7)
Mean Plasma Glucose: 123 mg/dL — ABNORMAL HIGH (ref <117)

## 2013-08-15 LAB — LIPID PANEL
Cholesterol: 166 mg/dL (ref 0–200)
HDL: 41 mg/dL (ref >39)
LDL Cholesterol: 100 mg/dL — ABNORMAL HIGH (ref 0–99)
Total CHOL/HDL Ratio: 4 Ratio
Triglycerides: 127 mg/dL (ref <150)
VLDL: 25 mg/dL (ref 0–40)

## 2013-08-19 ENCOUNTER — Encounter (INDEPENDENT_AMBULATORY_CARE_PROVIDER_SITE_OTHER): Payer: Self-pay

## 2013-08-19 ENCOUNTER — Other Ambulatory Visit: Payer: Self-pay | Admitting: Family Medicine

## 2013-08-19 ENCOUNTER — Ambulatory Visit (INDEPENDENT_AMBULATORY_CARE_PROVIDER_SITE_OTHER): Payer: Medicare Other | Admitting: Family Medicine

## 2013-08-19 ENCOUNTER — Encounter: Payer: Self-pay | Admitting: Family Medicine

## 2013-08-19 VITALS — BP 128/84 | HR 75 | Resp 16 | Ht 68.0 in | Wt 145.0 lb

## 2013-08-19 DIAGNOSIS — Z0000 Encounter for general adult medical examination without abnormal findings: Secondary | ICD-10-CM

## 2013-08-19 DIAGNOSIS — I10 Essential (primary) hypertension: Secondary | ICD-10-CM

## 2013-08-19 DIAGNOSIS — E785 Hyperlipidemia, unspecified: Secondary | ICD-10-CM

## 2013-08-19 DIAGNOSIS — Z23 Encounter for immunization: Secondary | ICD-10-CM

## 2013-08-19 DIAGNOSIS — E739 Lactose intolerance, unspecified: Secondary | ICD-10-CM

## 2013-08-19 DIAGNOSIS — Z13820 Encounter for screening for osteoporosis: Secondary | ICD-10-CM

## 2013-08-19 NOTE — Patient Instructions (Signed)
F/u in 4 month, call if you need me before  Bone density scan will be ordered  Pneumonia vaccine today and EKG  You will receive more information on "advanced directives" to review and think about and discuss with your family   Increase your cholesterol pill to 3 times weekly,a and be mindful of sugar intake  You will get a list of fruits which will show the  sugar content , nurse will explain this  Fasting lipid, cmp and hBA1C in 4 month, before next visit please

## 2013-08-19 NOTE — Progress Notes (Signed)
Subjective:    Patient ID: Wanda Ross, female    DOB: Mar 18, 1951, 63 y.o.   MRN: [id number]  HPI Preventive Screening-Counseling & Management   Patient present here today for a Medicare annual wellness visit.   Current Problems (verified)   Medications Prior to Visit Allergies (verified)   PAST HISTORY  Family History  Social History : Widow since 2010, retired at age 13, keeps active with her sisters, also involved with [employer] activities. No past or present h/o drug use   Risk Factors  Current exercise habits: 5 to 7 days per week for at least 30 mins per session often 45 mins to 1 hour  Dietary issues discussed:heart healthy, low sugar , a lot of fruit and vegetable   Cardiac risk factors: none significant   Depression Screen  (Note: if answer to either of the following is "Yes", a more complete depression screening is indicated)   Over the past two weeks, have you felt down, depressed or hopeless? No  Over the past two weeks, have you felt little interest or pleasure in doing things? No  Have you lost interest or pleasure in daily life? No  Do you often feel hopeless? No  Do you cry easily over simple problems? No   Activities of Daily Living  In your present state of health, do you have any difficulty performing the following activities?  Driving?: No Managing money?: No Feeding yourself?:No Getting from bed to chair?:No Climbing a flight of stairs?:No Preparing food and eating?:No Bathing or showering?:No Getting dressed?:No Getting to the toilet?:No Using the toilet?:No Moving around from place to place?: No  Fall Risk Assessment In the past year have you fallen or had a near fall?:No Are you currently taking any medications that make you dizzy?:No   Hearing Difficulties: No Do you often ask people to speak up or repeat themselves?:No Do you experience ringing or noises in your ears?:No Do you have difficulty understanding soft or whispered  voices?:No  Cognitive Testing  Alert? Yes Normal Appearance?Yes  Oriented to person? Yes Place? Yes  Time? Yes  Displays appropriate judgment?Yes  Can read the correct time from a watch face? yes Are you having problems remembering things?No  Advanced Directives have been discussed with the patient?Yes , needs to document wishes. Full code   List the Names of Other Physician/Practitioners you currently use: listed   Indicate any recent Medical Services you may have received from other than [facility] providers in the past year (date may be approximate).   Assessment:    Annual Wellness Exam   Plan:    During the course of the visit the patient was educated and counseled about appropriate screening and preventive services including:  A healthy diet is rich in fruit, vegetables and whole grains. Poultry fish, nuts and beans are a healthy choice for protein rather then red meat. A low sodium diet and drinking 64 ounces of water daily is generally recommended. Oils and sweet should be limited. Carbohydrates especially for those who are diabetic or overweight, should be limited to 30-45 gram per meal. It is important to eat on a regular schedule, at least 3 times daily. Snacks should be primarily fruits, vegetables or nuts. It is important that you exercise regularly at least 30 minutes 5 times a week. If you develop chest pain, have severe difficulty breathing, or feel very tired, stop exercising immediately and seek medical attention  Immunization reviewed and updated. Cancer screening reviewed and updated  Patient Instructions (the written plan) was given to the patient.  Medicare Attestation  I have personally reviewed:  The patient's medical and social history  Their use of alcohol, tobacco or illicit drugs  Their current medications and supplements  The patient's functional ability including ADLs,fall risks, home safety risks, cognitive, and hearing and visual impairment  Diet and  physical activities  Evidence for depression or mood disorders  The patient's weight, height, BMI, and visual acuity have been recorded in the chart. I have made referrals, counseling, and provided education to the patient based on review of the above and I have provided the patient with a written personalized care plan for preventive services.      Review of Systems     Objective:   Physical Exam        Assessment & Plan:  Welcome to Medicare preventive visit Welcome to medicare  as documented. Counseling done  re healthy lifestyle involving commitment to 150 minutes exercise per week, heart healthy diet, and attaining healthy weight.The importance of adequate sleep also discussed. Regular seat belt use , is also discussed. Immunization and cancer screening needs are specifically addressed at this visit, and are all up to date. Pt is a fully functional and healthy senior who has remained committed to healthy lifestyle with  good results

## 2013-08-28 ENCOUNTER — Other Ambulatory Visit (HOSPITAL_COMMUNITY): Payer: Medicare Other

## 2013-08-31 DIAGNOSIS — Z0000 Encounter for general adult medical examination without abnormal findings: Secondary | ICD-10-CM | POA: Insufficient documentation

## 2013-08-31 NOTE — Assessment & Plan Note (Addendum)
Welcome to [employer]  as documented. Counseling done  re healthy lifestyle involving commitment to 150 minutes exercise per week, heart healthy diet, and attaining healthy weight.The importance of adequate sleep also discussed. Regular seat belt use , is also discussed. Immunization and cancer screening needs are specifically addressed at this visit, and are all up to date. Pt is a fully functional and healthy senior who has remained committed to healthy lifestyle with  good results

## 2013-09-01 NOTE — Progress Notes (Signed)
   Subjective:    Patient ID: Wanda Ross, female    DOB: Feb 03, 1951, 63 y.o.   MRN: [id number]  HPI Patient in for welcome to medicare visit   Review of Systems     Objective:   Physical Exam        Assessment & Plan:

## 2013-09-05 ENCOUNTER — Ambulatory Visit (HOSPITAL_COMMUNITY)
Admission: RE | Admit: 2013-09-05 | Discharge: 2013-09-05 | Disposition: A | Discharge status: Home / Self Care | Payer: Medicare Other | Source: Ambulatory Visit | Attending: Family Medicine | Admitting: Family Medicine

## 2013-09-05 DIAGNOSIS — Z13820 Encounter for screening for osteoporosis: Secondary | ICD-10-CM | POA: Insufficient documentation

## 2013-09-15 ENCOUNTER — Encounter: Payer: Self-pay | Admitting: Family Medicine

## 2013-11-13 ENCOUNTER — Ambulatory Visit (INDEPENDENT_AMBULATORY_CARE_PROVIDER_SITE_OTHER): Payer: Medicare Other | Admitting: Otolaryngology

## 2013-11-13 DIAGNOSIS — D449 Neoplasm of uncertain behavior of unspecified endocrine gland: Secondary | ICD-10-CM

## 2013-12-23 LAB — COMPREHENSIVE METABOLIC PANEL
ALT: 16 U/L (ref 0–35)
AST: 20 U/L (ref 0–37)
Albumin: 4.6 g/dL (ref 3.5–5.2)
Alkaline Phosphatase: 62 U/L (ref 39–117)
BUN: 24 mg/dL — ABNORMAL HIGH (ref 6–23)
CO2: 28 mEq/L (ref 19–32)
Calcium: 9.9 mg/dL (ref 8.4–10.5)
Chloride: 103 mEq/L (ref 96–112)
Creat: 0.82 mg/dL (ref 0.50–1.10)
Glucose, Bld: 98 mg/dL (ref 70–99)
Potassium: 4.1 mEq/L (ref 3.5–5.3)
Sodium: 141 mEq/L (ref 135–145)
Total Bilirubin: 0.6 mg/dL (ref 0.2–1.2)
Total Protein: 7.5 g/dL (ref 6.0–8.3)

## 2013-12-23 LAB — LIPID PANEL
Cholesterol: 133 mg/dL (ref 0–200)
HDL: 32 mg/dL — ABNORMAL LOW (ref >39)
LDL Cholesterol: 75 mg/dL (ref 0–99)
Total CHOL/HDL Ratio: 4.2 Ratio
Triglycerides: 131 mg/dL (ref <150)
VLDL: 26 mg/dL (ref 0–40)

## 2013-12-23 LAB — HEMOGLOBIN A1C
Hgb A1c MFr Bld: 5.8 % — ABNORMAL HIGH (ref <5.7)
Mean Plasma Glucose: 120 mg/dL — ABNORMAL HIGH (ref <117)

## 2013-12-26 ENCOUNTER — Ambulatory Visit (INDEPENDENT_AMBULATORY_CARE_PROVIDER_SITE_OTHER): Payer: Medicare Other | Admitting: Family Medicine

## 2013-12-26 ENCOUNTER — Encounter: Payer: Self-pay | Admitting: Family Medicine

## 2013-12-26 ENCOUNTER — Encounter (INDEPENDENT_AMBULATORY_CARE_PROVIDER_SITE_OTHER): Payer: Self-pay

## 2013-12-26 VITALS — BP 130/80 | HR 77 | Resp 16 | Ht 68.0 in | Wt 141.0 lb

## 2013-12-26 DIAGNOSIS — Z23 Encounter for immunization: Secondary | ICD-10-CM

## 2013-12-26 DIAGNOSIS — I10 Essential (primary) hypertension: Secondary | ICD-10-CM

## 2013-12-26 DIAGNOSIS — H409 Unspecified glaucoma: Secondary | ICD-10-CM

## 2013-12-26 DIAGNOSIS — E559 Vitamin D deficiency, unspecified: Secondary | ICD-10-CM

## 2013-12-26 DIAGNOSIS — R5381 Other malaise: Secondary | ICD-10-CM

## 2013-12-26 DIAGNOSIS — E739 Lactose intolerance, unspecified: Secondary | ICD-10-CM

## 2013-12-26 DIAGNOSIS — E785 Hyperlipidemia, unspecified: Secondary | ICD-10-CM

## 2013-12-26 DIAGNOSIS — R5383 Other fatigue: Secondary | ICD-10-CM

## 2013-12-26 MED ORDER — PRAVASTATIN SODIUM 20 MG PO TABS: 20.0000 mg | ORAL_TABLET | Freq: Every day | ORAL | Status: DC

## 2013-12-26 MED ORDER — PRAVASTATIN SODIUM 10 MG PO TABS: 10.0000 mg | ORAL_TABLET | Freq: Every day | ORAL | Status: DC

## 2013-12-26 NOTE — Assessment & Plan Note (Signed)
Improved reduce dose to 10 mg daily Updated lab needed at/ before next visit.

## 2013-12-26 NOTE — Progress Notes (Signed)
   Subjective:    Patient ID: Wanda Ross, female    DOB: 1951/01/23, 63 y.o.   MRN: [id number]  HPI The PT is here for follow up and re-evaluation of chronic medical conditions, medication management and review of any available recent lab and radiology data.  Preventive health is updated, specifically  Cancer screening and Immunization.   Appt with opthalmologist this week yielded the result of increased intraoccular pressure, now started on oral meds The PT denies any adverse reactions to current medications since the last visit.  She remains commited to healthy lifestyle with excellent results      Review of Systems See HPI Denies recent fever or chills. Denies sinus pressure, nasal congestion, ear pain or sore throat. Denies chest congestion, productive cough or wheezing. Denies chest pains, palpitations and leg swelling Denies abdominal pain, nausea, vomiting,diarrhea or constipation.   Denies dysuria, frequency, hesitancy or incontinence. Denies joint pain, swelling and limitation in mobility. Denies headaches, seizures, numbness, or tingling. Denies depression,  or insomnia.Increased anxiety since pressure in eyes has deteriorated Denies skin break down or rash.        Objective:   Physical Exam BP 130/80  Pulse 77  Resp 16  Ht 5\' 8"  (1.727 m)  Wt 141 lb (63.957 kg)  BMI 21.44 kg/m2  SpO2 100% Patient alert and oriented and in no cardiopulmonary distress.  HEENT: No facial asymmetry, EOMI,   oropharynx pink and moist.  Neck supple no JVD, no mass.  Chest: Clear to auscultation bilaterally.  CVS: S1, S2 no murmurs, no S3.Regular rate.  ABD: Soft non tender.   Ext: No edema  MS: Adequate ROM spine, shoulders, hips and knees.  Skin: Intact, no ulcerations or rash noted.  Psych: Good eye contact, normal affect. Memory intact not anxious or depressed appearing.  CNS: CN 2-12 intact, power,  normal throughout.no focal deficits noted.          Assessment & Plan:

## 2013-12-26 NOTE — Assessment & Plan Note (Signed)
Deterioration in pressure , now on oral med with close f/u by opthalmologist

## 2013-12-26 NOTE — Assessment & Plan Note (Signed)
Prevnar administered in office

## 2013-12-26 NOTE — Patient Instructions (Addendum)
F/u in 4 month, call if you need me before  BP and labs are excellent, reduce dose of pravstatin to 10mg  daily  Prevnar today  Keep up your healthy habits!  Call in September for your flu vaccine  Fasting lipid, cmp , hBa1C TSH , cBNC and Vit D in December

## 2013-12-26 NOTE — Assessment & Plan Note (Signed)
Improved Patient educated about the importance of limiting  Carbohydrate intake , the need to commit to daily physical activity for a minimum of 30 minutes , and to commit weight loss. The fact that changes in all these areas will reduce or eliminate all together the development of diabetes is stressed.

## 2013-12-26 NOTE — Assessment & Plan Note (Signed)
Controlled, no change in medication DASH diet and commitment to daily physical activity for a minimum of 30 minutes discussed and encouraged, as a part of hypertension management. The importance of attaining a healthy weight is also discussed.  

## 2014-02-03 ENCOUNTER — Telehealth: Payer: Self-pay | Admitting: *Deleted

## 2014-02-03 ENCOUNTER — Ambulatory Visit: Payer: Medicare Other

## 2014-02-03 NOTE — Telephone Encounter (Signed)
Pt called stating she has a current fever blister, per pt it goes away and come back, pt would like something for it so it will go away and not come back. Please advise [phone]

## 2014-02-03 NOTE — Telephone Encounter (Signed)
Please advise what dose of Acyclovir

## 2014-02-04 ENCOUNTER — Ambulatory Visit (INDEPENDENT_AMBULATORY_CARE_PROVIDER_SITE_OTHER): Payer: Medicare Other

## 2014-02-04 ENCOUNTER — Encounter (INDEPENDENT_AMBULATORY_CARE_PROVIDER_SITE_OTHER): Payer: Self-pay

## 2014-02-04 ENCOUNTER — Ambulatory Visit: Payer: Medicare Other

## 2014-02-04 DIAGNOSIS — Z23 Encounter for immunization: Secondary | ICD-10-CM

## 2014-02-06 ENCOUNTER — Telehealth: Payer: Self-pay | Admitting: Family Medicine

## 2014-02-06 NOTE — Telephone Encounter (Signed)
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 May print pt ed on this for her also. For the flares , single dose of famvir 1500mg  is recommended, I will enter this and 4 refills, so she may fill at pharmacy when and if she has more flares, pls explain and let her know  Pls send after you speak with her

## 2014-02-09 ENCOUNTER — Other Ambulatory Visit: Payer: Self-pay

## 2014-02-09 MED ORDER — FAMCICLOVIR 500 MG PO TABS: ORAL_TABLET | ORAL | Status: DC

## 2014-02-09 NOTE — Telephone Encounter (Signed)
Patient aware and med sent to pharmacy requested.

## 2014-03-24 ENCOUNTER — Other Ambulatory Visit: Payer: Self-pay | Admitting: Family Medicine

## 2014-05-05 LAB — CBC WITH DIFFERENTIAL/PLATELET
Basophils Absolute: 0 10*3/uL (ref 0.0–0.1)
Basophils Relative: 0 % (ref 0–1)
Eosinophils Absolute: 0.4 10*3/uL (ref 0.0–0.7)
Eosinophils Relative: 6 % — ABNORMAL HIGH (ref 0–5)
HCT: 36.4 % (ref 36.0–46.0)
Hemoglobin: 12.8 g/dL (ref 12.0–15.0)
Lymphocytes Relative: 21 % (ref 12–46)
Lymphs Abs: 1.4 10*3/uL (ref 0.7–4.0)
MCH: 29.8 pg (ref 26.0–34.0)
MCHC: 35.2 g/dL (ref 30.0–36.0)
MCV: 84.7 fL (ref 78.0–100.0)
Monocytes Absolute: 0.6 10*3/uL (ref 0.1–1.0)
Monocytes Relative: 9 % (ref 3–12)
Neutro Abs: 4.3 10*3/uL (ref 1.7–7.7)
Neutrophils Relative %: 64 % (ref 43–77)
Platelets: 333 10*3/uL (ref 150–400)
RBC: 4.3 MIL/uL (ref 3.87–5.11)
RDW: 13.9 % (ref 11.5–15.5)
WBC: 6.7 10*3/uL (ref 4.0–10.5)

## 2014-05-05 LAB — COMPREHENSIVE METABOLIC PANEL
ALT: <8 U/L (ref 0–35)
AST: 20 U/L (ref 0–37)
Albumin: 4.5 g/dL (ref 3.5–5.2)
Alkaline Phosphatase: 56 U/L (ref 39–117)
BUN: 21 mg/dL (ref 6–23)
CO2: 26 mEq/L (ref 19–32)
Calcium: 9.4 mg/dL (ref 8.4–10.5)
Chloride: 107 mEq/L (ref 96–112)
Creat: 0.75 mg/dL (ref 0.50–1.10)
Glucose, Bld: 96 mg/dL (ref 70–99)
Potassium: 3.6 mEq/L (ref 3.5–5.3)
Sodium: 143 mEq/L (ref 135–145)
Total Bilirubin: 0.7 mg/dL (ref 0.2–1.2)
Total Protein: 6.8 g/dL (ref 6.0–8.3)

## 2014-05-05 LAB — LIPID PANEL
Cholesterol: 118 mg/dL (ref 0–200)
HDL: 33 mg/dL — ABNORMAL LOW (ref >39)
LDL Cholesterol: 62 mg/dL (ref 0–99)
Total CHOL/HDL Ratio: 3.6 Ratio
Triglycerides: 114 mg/dL (ref <150)
VLDL: 23 mg/dL (ref 0–40)

## 2014-05-05 LAB — HEMOGLOBIN A1C
Hgb A1c MFr Bld: 5.5 % (ref <5.7)
Mean Plasma Glucose: 111 mg/dL (ref <117)

## 2014-05-05 LAB — TSH: TSH: 2.495 u[IU]/mL (ref 0.350–4.500)

## 2014-05-06 LAB — VITAMIN D 25 HYDROXY (VIT D DEFICIENCY, FRACTURES): Vit D, 25-Hydroxy: 34 ng/mL (ref 30–100)

## 2014-05-07 ENCOUNTER — Ambulatory Visit (INDEPENDENT_AMBULATORY_CARE_PROVIDER_SITE_OTHER): Payer: Medicare Other | Admitting: Family Medicine

## 2014-05-07 ENCOUNTER — Encounter: Payer: Self-pay | Admitting: Family Medicine

## 2014-05-07 VITALS — BP 120/72 | HR 92 | Resp 16 | Ht 68.0 in | Wt 146.1 lb

## 2014-05-07 DIAGNOSIS — E785 Hyperlipidemia, unspecified: Secondary | ICD-10-CM

## 2014-05-07 DIAGNOSIS — I10 Essential (primary) hypertension: Secondary | ICD-10-CM

## 2014-05-07 DIAGNOSIS — E559 Vitamin D deficiency, unspecified: Secondary | ICD-10-CM

## 2014-05-07 DIAGNOSIS — E739 Lactose intolerance, unspecified: Secondary | ICD-10-CM

## 2014-05-07 DIAGNOSIS — H409 Unspecified glaucoma: Secondary | ICD-10-CM

## 2014-05-07 NOTE — Patient Instructions (Signed)
Annual wellness in 5 mionth, call if you need me before  Excellent BP and labs , no changes inn medication  Fasting lipid, cmp in 5 month  Continue excellent health habits, blood sugar is now entirely normal which is GREAT!

## 2014-05-07 NOTE — Progress Notes (Signed)
   Subjective:    Patient ID: Wanda Ross, female    DOB: 1951-01-13, 63 y.o.   MRN: [id number]  HPI The PT is here for follow up and re-evaluation of chronic medical conditions, medication management and review of any available recent lab and radiology data.  Preventive health is updated, specifically  Cancer screening and Immunization.   Questions or concerns regarding consultations or procedures which the PT has had in the interim are  Addressed.Haapily learned that the problem with her eye was due to oTC nasal spray , adn pressure is great The PT denies any adverse reactions to current medications since the last visit.  There are no new concerns. She is enjoying weight watchers There are no specific complaints       Review of Systems See HPI Denies recent fever or chills. Denies sinus pressure, nasal congestion, ear pain or sore throat. Denies chest congestion, productive cough or wheezing. Denies chest pains, palpitations and leg swelling Denies abdominal pain, nausea, vomiting,diarrhea or constipation.   Denies dysuria, frequency, hesitancy or incontinence. Denies joint pain, swelling and limitation in mobility. Denies headaches, seizures, numbness, or tingling. Denies depression, anxiety or insomnia. Denies skin break down or rash.        Objective:   Physical Exam BP 120/72 mmHg  Pulse 92  Resp 16  Ht 5\' 8"  (1.727 m)  Wt 146 lb 1.9 oz (66.28 kg)  BMI 22.22 kg/m2  SpO2 100% Patient alert and oriented and in no cardiopulmonary distress.  HEENT: No facial asymmetry, EOMI,   oropharynx pink and moist.  Neck supple no JVD, no mass.  Chest: Clear to auscultation bilaterally.  CVS: S1, S2 no murmurs, no S3.Regular rate.  ABD: Soft non tender.   Ext: No edema  MS: Adequate ROM spine, shoulders, hips and knees.  Skin: Intact, no ulcerations or rash noted.  Psych: Good eye contact, normal affect. Memory intact not anxious or depressed appearing.  CNS:  CN 2-12 intact, power,  normal throughout.no focal deficits noted.        Assessment & Plan:  Essential hypertension Controlled, no change in medication  DASH diet and commitment to daily physical activity for a minimum of 30 minutes discussed and encouraged, as a part of hypertension management. The importance of attaining a healthy weight is also discussed.   IMPAIRED GLUCOSE TOLERANCE Improved and normalized in 04/2014 Pt applauded on this and encouraged to continue excellent health habits  Hyperlipidemia Controlled, no change in medication Hyperlipidemia:Low fat diet discussed and encouraged.    Glaucoma Reports excellent pressures at last visit and doing well after recent scare that she was getting into trouble. Fortunately , the culprit was an OTC nasal spray  Vitamin D deficiency Corrected, and normal

## 2014-05-08 NOTE — Assessment & Plan Note (Addendum)
Controlled, no change in medication DASH diet and commitment to daily physical activity for a minimum of 30 minutes discussed and encouraged, as a part of hypertension management. The importance of attaining a healthy weight is also discussed.  

## 2014-05-08 NOTE — Assessment & Plan Note (Signed)
Reports excellent pressures at last visit and doing well after recent scare that she was getting into trouble. Fortunately , the culprit was an OTC nasal spray

## 2014-05-08 NOTE — Assessment & Plan Note (Signed)
Improved and normalized in 04/2014 Pt applauded on this and encouraged to continue excellent health habits

## 2014-05-08 NOTE — Assessment & Plan Note (Signed)
Controlled, no change in medication Hyperlipidemia:Low fat diet discussed and encouraged.  \ 

## 2014-05-08 NOTE — Assessment & Plan Note (Signed)
Corrected, and normal

## 2014-05-25 ENCOUNTER — Other Ambulatory Visit: Payer: Self-pay | Admitting: Family Medicine

## 2014-05-25 DIAGNOSIS — Z1231 Encounter for screening mammogram for malignant neoplasm of breast: Secondary | ICD-10-CM

## 2014-06-04 ENCOUNTER — Ambulatory Visit (HOSPITAL_COMMUNITY)
Admission: RE | Admit: 2014-06-04 | Discharge: 2014-06-04 | Disposition: A | Discharge status: Home / Self Care | Payer: Medicare Other | Source: Ambulatory Visit | Attending: Family Medicine | Admitting: Family Medicine

## 2014-06-04 DIAGNOSIS — Z1231 Encounter for screening mammogram for malignant neoplasm of breast: Secondary | ICD-10-CM | POA: Diagnosis not present

## 2014-06-21 ENCOUNTER — Other Ambulatory Visit: Payer: Self-pay | Admitting: Family Medicine

## 2014-07-16 ENCOUNTER — Encounter: Payer: Self-pay | Admitting: Obstetrics & Gynecology

## 2014-07-16 ENCOUNTER — Ambulatory Visit (INDEPENDENT_AMBULATORY_CARE_PROVIDER_SITE_OTHER): Payer: Medicare Other | Admitting: Obstetrics & Gynecology

## 2014-07-16 VITALS — BP 130/90 | Ht 68.0 in | Wt 145.0 lb

## 2014-07-16 DIAGNOSIS — Z1212 Encounter for screening for malignant neoplasm of rectum: Secondary | ICD-10-CM

## 2014-07-16 DIAGNOSIS — Z01419 Encounter for gynecological examination (general) (routine) without abnormal findings: Secondary | ICD-10-CM

## 2014-07-16 DIAGNOSIS — Z1211 Encounter for screening for malignant neoplasm of colon: Secondary | ICD-10-CM

## 2014-07-16 NOTE — Progress Notes (Signed)
Patient ID: Wanda Ross, female   DOB: November 08, 1950, 64 y.o.   MRN: [id number] Subjective:     Wanda Ross is a 64 y.o. female here for a routine exam.  No LMP recorded. Patient is postmenopausal. No obstetric history on file. Birth Control Method:  na Menstrual Calendar(currently): amenorrheic  Current complaints: none.   Current acute medical issues:  none   Recent Gynecologic History No LMP recorded. Patient is postmenopausal. Last Pap: 2015,  normal Last mammogram: 06/05/2014,  normal  Past Medical History  Diagnosis Date  . Hypertension 1990  . Hyperlipidemia 2010  . Multinodular goiter 2010    normal function    History reviewed. No pertinent past surgical history.  OB History    No data available      History   Social History  . Marital Status: Widowed    Spouse Name: N/A  . Number of Children: 3  . Years of Education: N/A   Occupational History  . employed     Social History Main Topics  . Smoking status: Never Smoker   . Smokeless tobacco: Never Used  . Alcohol Use: No  . Drug Use: No  . Sexual Activity: Yes    Birth Control/ Protection: Post-menopausal   Other Topics Concern  . None   Social History Narrative    Family History  Problem Relation Age of Onset  . Dementia Father   . Dementia Mother   . Diabetes Mother   . Hypertension Mother   . Cancer Sister     breast  . Hypertension Sister      Current outpatient prescriptions:  .  Aspirin (ADULT ASPIRIN LOW STRENGTH) 81 MG EC tablet, Take 81 mg by mouth daily.  , Disp: , Rfl:  .  bimatoprost (LUMIGAN) 0.01 % SOLN, 1 drop at bedtime.  , Disp: , Rfl:  .  brimonidine-timolol (COMBIGAN) 0.2-0.5 % ophthalmic solution, Place 1 drop into both eyes every 12 (twelve) hours., Disp: , Rfl:  .  Calcium Carbonate-Vit D-Min (CALCIUM 1200) 1200-1000 MG-UNIT CHEW, Chew 1,200 mg by mouth daily.  , Disp: , Rfl:  .  cloNIDine (CATAPRES) 0.2 MG tablet, TAKE 1 TABLET BY MOUTH EVERY DAY, Disp: 90  tablet, Rfl: 1 .  famciclovir (FAMVIR) 500 MG tablet, Three tablets  One time only for treatment of recurrent "cold sore", Disp: 3 tablet, Rfl: 3 .  methazolamide (NEPTAZANE) 50 MG tablet, Take 50 mg by mouth 2 (two) times daily., Disp: , Rfl:  .  Multiple Vitamins-Minerals (CENTRUM SILVER ULTRA WOMENS) TABS, Take by mouth daily.  , Disp: , Rfl:  .  pravastatin (PRAVACHOL) 10 MG tablet, TAKE 1 TABLET BY MOUTH DAILY *DOSE REDUCTION*, Disp: 90 tablet, Rfl: 1 .  valsartan-hydrochlorothiazide (DIOVAN-HCT) 320-25 MG per tablet, TAKE 1 TABLET BY MOUTH ONCE A DAY, Disp: 90 tablet, Rfl: 1  Review of Systems  Review of Systems  Constitutional: Negative for fever, chills, weight loss, malaise/fatigue and diaphoresis.  HENT: Negative for hearing loss, ear pain, nosebleeds, congestion, sore throat, neck pain, tinnitus and ear discharge.   Eyes: Negative for blurred vision, double vision, photophobia, pain, discharge and redness.  Respiratory: Negative for cough, hemoptysis, sputum production, shortness of breath, wheezing and stridor.   Cardiovascular: Negative for chest pain, palpitations, orthopnea, claudication, leg swelling and PND.  Gastrointestinal: negative for abdominal pain. Negative for heartburn, nausea, vomiting, diarrhea, constipation, blood in stool and melena.  Genitourinary: Negative for dysuria, urgency, frequency, hematuria and flank pain.  Musculoskeletal: Negative for  myalgias, back pain, joint pain and falls.  Skin: Negative for itching and rash.  Neurological: Negative for dizziness, tingling, tremors, sensory change, speech change, focal weakness, seizures, loss of consciousness, weakness and headaches.  Endo/Heme/Allergies: Negative for environmental allergies and polydipsia. Does not bruise/bleed easily.  Psychiatric/Behavioral: Negative for depression, suicidal ideas, hallucinations, memory loss and substance abuse. The patient is not nervous/anxious and does not have insomnia.         Objective:  Blood pressure 130/90, height 5\' 8"  (1.727 m), weight 145 lb (65.772 kg).   Physical Exam  Vitals reviewed. Constitutional: She is oriented to person, place, and time. She appears well-developed and well-nourished.  HENT:  Head: Normocephalic and atraumatic.        Right Ear: External ear normal.  Left Ear: External ear normal.  Nose: Nose normal.  Mouth/Throat: Oropharynx is clear and moist.  Eyes: Conjunctivae and EOM are normal. Pupils are equal, round, and reactive to light. Right eye exhibits no discharge. Left eye exhibits no discharge. No scleral icterus.  Neck: Normal range of motion. Neck supple. No tracheal deviation present. No thyromegaly present.  Cardiovascular: Normal rate, regular rhythm, normal heart sounds and intact distal pulses.  Exam reveals no gallop and no friction rub.   No murmur heard. Respiratory: Effort normal and breath sounds normal. No respiratory distress. She has no wheezes. She has no rales. She exhibits no tenderness.  GI: Soft. Bowel sounds are normal. She exhibits no distension and no mass. There is no tenderness. There is no rebound and no guarding.  Genitourinary:  Breasts no masses skin changes or nipple changes bilaterally      Vulva is normal without lesions Vagina is pink moist without discharge Cervix normal in appearance and pap is done Uterus is normal size shape and contour Adnexa is negative with normal sized ovaries  Rectal    hemoccult negative, normal tone, no masses  Musculoskeletal: Normal range of motion. She exhibits no edema and no tenderness.  Neurological: She is alert and oriented to person, place, and time. She has normal reflexes. She displays normal reflexes. No cranial nerve deficit. She exhibits normal muscle tone. Coordination normal.  Skin: Skin is warm and dry. No rash noted. No erythema. No pallor.  Psychiatric: She has a normal mood and affect. Her behavior is normal. Judgment and thought content  normal.       Assessment:    Healthy female exam.    Plan:    Mammogram ordered. Follow up in: 1 year.

## 2014-09-17 ENCOUNTER — Other Ambulatory Visit: Payer: Self-pay | Admitting: Family Medicine

## 2014-10-07 LAB — COMPREHENSIVE METABOLIC PANEL
ALT: 10 U/L (ref 0–35)
AST: 23 U/L (ref 0–37)
Albumin: 4.1 g/dL (ref 3.5–5.2)
Alkaline Phosphatase: 53 U/L (ref 39–117)
BUN: 20 mg/dL (ref 6–23)
CO2: 27 mEq/L (ref 19–32)
Calcium: 9.5 mg/dL (ref 8.4–10.5)
Chloride: 108 mEq/L (ref 96–112)
Creat: 0.82 mg/dL (ref 0.50–1.10)
Glucose, Bld: 94 mg/dL (ref 70–99)
Potassium: 3.9 mEq/L (ref 3.5–5.3)
Sodium: 142 mEq/L (ref 135–145)
Total Bilirubin: 0.6 mg/dL (ref 0.2–1.2)
Total Protein: 6.6 g/dL (ref 6.0–8.3)

## 2014-10-07 LAB — LIPID PANEL
Cholesterol: 128 mg/dL (ref 0–200)
HDL: 44 mg/dL — ABNORMAL LOW (ref >=46)
LDL Cholesterol: 64 mg/dL (ref 0–99)
Total CHOL/HDL Ratio: 2.9 Ratio
Triglycerides: 98 mg/dL (ref <150)
VLDL: 20 mg/dL (ref 0–40)

## 2014-10-08 ENCOUNTER — Ambulatory Visit (INDEPENDENT_AMBULATORY_CARE_PROVIDER_SITE_OTHER): Payer: Medicare Other | Admitting: Family Medicine

## 2014-10-08 ENCOUNTER — Encounter: Payer: Self-pay | Admitting: Family Medicine

## 2014-10-08 VITALS — BP 120/72 | HR 85 | Resp 16 | Ht 68.5 in | Wt 147.1 lb

## 2014-10-08 DIAGNOSIS — E559 Vitamin D deficiency, unspecified: Secondary | ICD-10-CM

## 2014-10-08 DIAGNOSIS — Z0000 Encounter for general adult medical examination without abnormal findings: Secondary | ICD-10-CM

## 2014-10-08 DIAGNOSIS — E785 Hyperlipidemia, unspecified: Secondary | ICD-10-CM

## 2014-10-08 DIAGNOSIS — E042 Nontoxic multinodular goiter: Secondary | ICD-10-CM

## 2014-10-08 DIAGNOSIS — E739 Lactose intolerance, unspecified: Secondary | ICD-10-CM

## 2014-10-08 DIAGNOSIS — I10 Essential (primary) hypertension: Secondary | ICD-10-CM

## 2014-10-08 MED ORDER — PRAVASTATIN SODIUM 10 MG PO TABS: ORAL_TABLET | ORAL | Status: DC

## 2014-10-08 NOTE — Patient Instructions (Addendum)
F/u Dec 12 or after, call if you need me before  Fasting cbc, lipid, cmp, TSH, vit D , HBA1C  12/87 or after  [place]  Thyroid US is requested  For June (mid)

## 2014-10-08 NOTE — Assessment & Plan Note (Signed)

## 2014-10-08 NOTE — Progress Notes (Signed)
Subjective:    Patient ID: Wanda Ross, female    DOB: May 23, 1951, 64 y.o.   MRN: [id number]  HPI Preventive Screening-Counseling & Management   Patient present here today for a Medicare annual wellness visit.   Current Problems (verified)   Medications Prior to Visit Allergies (verified)   PAST HISTORY  Family History (verified)   Social History Widow, 3 children and retired from [employer]  Current exercise habits:  Exercise everyday, walks 5 miles everyday, uses a fitbit and she does light weights   Dietary issues discussed: Eats a lot of fruits and vegetables and limits fried fatty foods    Cardiac risk factors: None significant   Depression Screen  (Note: if answer to either of the following is "Yes", a more complete depression screening is indicated)   Over the past two weeks, have you felt down, depressed or hopeless? No  Over the past two weeks, have you felt little interest or pleasure in doing things? No  Have you lost interest or pleasure in daily life? No  Do you often feel hopeless? No  Do you cry easily over simple problems? No   Activities of Daily Living  In your present state of health, do you have any difficulty performing the following activities?  Driving?: No Managing money?: No Feeding yourself?:No Getting from bed to chair?:No Climbing a flight of stairs?:No Preparing food and eating?:No Bathing or showering?:No Getting dressed?:No Getting to the toilet?:No Using the toilet?:No Moving around from place to place?: No  Fall Risk Assessment In the past year have you fallen or had a near fall?:No Are you currently taking any medications that make you dizzy?:No   Hearing Difficulties: No Do you often ask people to speak up or repeat themselves?:No Do you experience ringing or noises in your ears?:No Do you have difficulty understanding soft or whispered voices?:No  Cognitive Testing  Alert? Yes Normal  Appearance?Yes  Oriented to person? Yes Place? Yes  Time? Yes  Displays appropriate judgment?Yes  Can read the correct time from a watch face? yes Are you having problems remembering things?No  Advanced Directives have been discussed with the patient?Yes, no living will, will give brochure , full code   List the Names of Other Physician/Practitioners you currently use:  Wanda Ross (gyne)  Wanda Ross (endo)    Indicate any recent Medical Services you may have received from other than [facility] providers in the past year (date may be approximate).   Assessment:    Annual Wellness Exam   Plan:     Medicare Attestation  I have personally reviewed:  The patient's medical and social history  Their use of alcohol, tobacco or illicit drugs  Their current medications and supplements  The patient's functional ability including ADLs,fall risks, home safety risks, cognitive, and hearing and visual impairment  Diet and physical activities  Evidence for depression or mood disorders  The patient's weight, height, BMI, and visual acuity have been recorded in the chart. I have made referrals, counseling, and provided education to the patient based on review of the above and I have provided the patient with a written personalized care plan for preventive services.      Review of Systems     Objective:   Physical Exam BP 120/72 mmHg  Pulse 85  Resp 16  Ht 5' 8.5" (1.74 m)  Wt 147 lb 1.9 oz (66.733 kg)  BMI 22.04 kg/m2  SpO2 100%  Assessment & Plan:  Medicare annual wellness visit, subsequent Annual exam as documented. Counseling done  re healthy lifestyle involving commitment to 150 minutes exercise per week, heart healthy diet, and attaining healthy weight.The importance of adequate sleep also discussed. Regular seat belt use and home safety, is also discussed. Changes in health habits are decided on by the patient with goals and time frames  set for achieving them. Immunization  and cancer screening needs are specifically addressed at this visit.

## 2014-11-12 ENCOUNTER — Ambulatory Visit (HOSPITAL_COMMUNITY)
Admission: RE | Admit: 2014-11-12 | Discharge: 2014-11-12 | Disposition: A | Discharge status: Home / Self Care | Payer: Medicare Other | Source: Ambulatory Visit | Attending: Family Medicine | Admitting: Family Medicine

## 2014-11-12 DIAGNOSIS — E042 Nontoxic multinodular goiter: Secondary | ICD-10-CM | POA: Diagnosis present

## 2014-11-26 ENCOUNTER — Ambulatory Visit (INDEPENDENT_AMBULATORY_CARE_PROVIDER_SITE_OTHER): Payer: Medicare Other | Admitting: Otolaryngology

## 2014-12-24 ENCOUNTER — Ambulatory Visit (INDEPENDENT_AMBULATORY_CARE_PROVIDER_SITE_OTHER): Payer: Medicare Other | Admitting: Otolaryngology

## 2014-12-24 DIAGNOSIS — D440 Neoplasm of uncertain behavior of thyroid gland: Secondary | ICD-10-CM | POA: Diagnosis not present

## 2015-01-26 ENCOUNTER — Ambulatory Visit (INDEPENDENT_AMBULATORY_CARE_PROVIDER_SITE_OTHER): Payer: Medicare Other | Admitting: Family Medicine

## 2015-01-26 ENCOUNTER — Ambulatory Visit (HOSPITAL_COMMUNITY)
Admission: RE | Admit: 2015-01-26 | Discharge: 2015-01-26 | Disposition: A | Discharge status: Home / Self Care | Payer: Medicare Other | Source: Ambulatory Visit | Attending: Family Medicine | Admitting: Family Medicine

## 2015-01-26 ENCOUNTER — Encounter: Payer: Self-pay | Admitting: Family Medicine

## 2015-01-26 VITALS — BP 120/80 | HR 86 | Resp 16 | Ht 68.5 in | Wt 148.1 lb

## 2015-01-26 DIAGNOSIS — M8938 Hypertrophy of bone, other site: Secondary | ICD-10-CM | POA: Insufficient documentation

## 2015-01-26 DIAGNOSIS — I10 Essential (primary) hypertension: Secondary | ICD-10-CM

## 2015-01-26 DIAGNOSIS — M5136 Other intervertebral disc degeneration, lumbar region: Secondary | ICD-10-CM | POA: Insufficient documentation

## 2015-01-26 DIAGNOSIS — M545 Low back pain: Secondary | ICD-10-CM | POA: Insufficient documentation

## 2015-01-26 DIAGNOSIS — M5410 Radiculopathy, site unspecified: Secondary | ICD-10-CM

## 2015-01-26 DIAGNOSIS — Z23 Encounter for immunization: Secondary | ICD-10-CM

## 2015-01-26 DIAGNOSIS — E785 Hyperlipidemia, unspecified: Secondary | ICD-10-CM

## 2015-01-26 MED ORDER — RANITIDINE HCL 150 MG PO CAPS: 150.0000 mg | ORAL_CAPSULE | Freq: Two times a day (BID) | ORAL | Status: DC

## 2015-01-26 MED ORDER — METHYLPREDNISOLONE ACETATE 80 MG/ML IJ SUSP
80.0000 mg | Freq: Once | INTRAMUSCULAR | Status: AC
  Administered 2015-01-26: 80 mg via INTRAMUSCULAR

## 2015-01-26 MED ORDER — PREDNISONE 5 MG PO TABS: ORAL_TABLET | ORAL | Status: DC

## 2015-01-26 MED ORDER — KETOROLAC TROMETHAMINE 60 MG/2ML IM SOLN
60.0000 mg | Freq: Once | INTRAMUSCULAR | Status: AC
  Administered 2015-01-26: 60 mg via INTRAMUSCULAR

## 2015-01-26 MED ORDER — IBUPROFEN 800 MG PO TABS: ORAL_TABLET | ORAL | Status: DC

## 2015-01-26 NOTE — Patient Instructions (Addendum)
F/u as before, call if you need me sooner  You are treated for sciatic nerve pain with anti inflalmmatory meds , short term (1 week)  Xray of low back today and injections in the office also  Call if symptoms persist, will start gaabpentin at that time  Flu vaccine today  Excellent BP  Sciatica Sciatica is pain, weakness, numbness, or tingling along your sciatic nerve. The nerve starts in the lower back and runs down the back of each leg. Nerve damage or certain conditions pinch or put pressure on the sciatic nerve. This causes the pain, weakness, and other discomforts of sciatica. HOME CARE   Only take medicine as told by your doctor.  Apply ice to the affected area for 20 minutes. Do this 3-4 times a day for the first 48-72 hours. Then try heat in the same way.  Exercise, stretch, or do your usual activities if these do not make your pain worse.  Go to physical therapy as told by your doctor.  Keep all doctor visits as told.  Do not wear high heels or shoes that are not supportive.  Get a firm mattress if your mattress is too soft to lessen pain and discomfort. GET HELP RIGHT AWAY IF:   You cannot control when you poop (bowel movement) or pee (urinate).  You have more weakness in your lower back, lower belly (pelvis), butt (buttocks), or legs.  You have redness or puffiness (swelling) of your back.  You have a burning feeling when you pee.  You have pain that gets worse when you lie down.  You have pain that wakes you from your sleep.  Your pain is worse than past pain.  Your pain lasts longer than 4 weeks.  You are suddenly losing weight without reason. MAKE SURE YOU:   Understand these instructions.  Will watch this condition.  Will get help right away if you are not doing well or get worse. Document Released: 02/22/2008 Document Revised: 11/14/2011 Document Reviewed: 09/24/2011 [facility] Patient Information 2015 [place], [place]. This information is not  intended to replace advice given to you by your health care provider. Make sure you discuss any questions you have with your health care provider.

## 2015-02-07 DIAGNOSIS — Z23 Encounter for immunization: Secondary | ICD-10-CM | POA: Insufficient documentation

## 2015-02-07 NOTE — Progress Notes (Signed)
   Wanda Ross     MRN: [id number]      DOB: [birth year]   HPI Wanda Ross is here with main c/o several month h/o low back and buttock and thigh pain. She denies any known inciting incident. Denies lower extremity weakness or numbness. She denies incontinence of stool or urine. Follow up and re-evaluation of chronic medical conditions, medication management and review of any available recent lab and radiology data is also done at visit.  Preventive health is updated, specifically  Cancer screening and Immunization.   Questions or concerns regarding consultations or procedures which the PT has had in the interim are  addressed. The PT denies any adverse reactions to current medications since the last visit.     ROS Denies recent fever or chills. Denies sinus pressure, nasal congestion, ear pain or sore throat. Denies chest congestion, productive cough or wheezing. Denies chest pains, palpitations and leg swelling Denies abdominal pain, nausea, vomiting,diarrhea or constipation.   Denies dysuria, frequency, hesitancy or incontinence.  Denies headaches, seizures,  Denies depression, anxiety or insomnia. Denies skin break down or rash.   PE  BP 120/80 mmHg  Pulse 86  Resp 16  Ht 5' 8.5" (1.74 m)  Wt 148 lb 1.9 oz (67.187 kg)  BMI 22.19 kg/m2  SpO2 100%  Patient alert and oriented and in no cardiopulmonary distress.  HEENT: No facial asymmetry, EOMI,   oropharynx pink and moist.  Neck supple no JVD, no mass.  Chest: Clear to auscultation bilaterally.  CVS: S1, S2 no murmurs, no S3.Regular rate.  ABD: Soft non tender.   Ext: No edema  MS: decreased ROM lumbar spine, adequate in hips, knees and shoulders  Skin: Intact, no ulcerations or rash noted.  Psych: Good eye contact, normal affect. Memory intact not anxious or depressed appearing.  CNS: CN 2-12 intact, power,  normal throughout.no focal deficits noted.   Assessment & Plan   Back pain with left-sided  radiculopathy Uncontrolled.Toradol and depo medrol administered IM in the office , to be followed by a short course of oral prednisone and NSAIDS. Xray today also  Essential hypertension DASH diet and commitment to daily physical activity for a minimum of 30 minutes discussed and encouraged, as a part of hypertension management. The importance of attaining a healthy weight is also discussed.  BP/Weight 01/26/2015 10/08/2014 07/16/2014 05/07/2014 12/26/2013 08/19/2013 2/97/9892  Systolic BP 119 417 408 144 818 563 -  Diastolic BP 80 72 90 72 80 84 -  Wt. (Lbs) 148.12 147.12 145 146.12 141 145 148  BMI 22.19 22.04 22.05 22.22 21.44 22.05 22.51        Hyperlipidemia Controlled, no change in medication Hyperlipidemia:Low fat diet discussed and encouraged.   Lipid Panel  Lab Results  Component Value Date   CHOL 128 10/06/2014   HDL 44* 10/06/2014   LDLCALC 64 10/06/2014   TRIG 98 10/06/2014   CHOLHDL 2.9 10/06/2014        Need for prophylactic vaccination and inoculation against influenza After obtaining informed consent, the vaccine is  administered by LPN.

## 2015-02-07 NOTE — Assessment & Plan Note (Signed)
Uncontrolled.Toradol and depo medrol administered IM in the office , to be followed by a short course of oral prednisone and NSAIDS. Xray today also

## 2015-02-07 NOTE — Assessment & Plan Note (Signed)
After obtaining informed consent, the vaccine is  administered by LPN.  

## 2015-02-07 NOTE — Assessment & Plan Note (Signed)
DASH diet and commitment to daily physical activity for a minimum of 30 minutes discussed and encouraged, as a part of hypertension management. The importance of attaining a healthy weight is also discussed.  BP/Weight 01/26/2015 10/08/2014 07/16/2014 05/07/2014 12/26/2013 08/19/2013 11/28/4033  Systolic BP 248 185 909 311 216 244 -  Diastolic BP 80 72 90 72 80 84 -  Wt. (Lbs) 148.12 147.12 145 146.12 141 145 148  BMI 22.19 22.04 22.05 22.22 21.44 22.05 22.51

## 2015-02-07 NOTE — Assessment & Plan Note (Signed)
Controlled, no change in medication Hyperlipidemia:Low fat diet discussed and encouraged.   Lipid Panel  Lab Results  Component Value Date   CHOL 128 10/06/2014   HDL 44* 10/06/2014   LDLCALC 64 10/06/2014   TRIG 98 10/06/2014   CHOLHDL 2.9 10/06/2014

## 2015-03-25 ENCOUNTER — Other Ambulatory Visit: Payer: Self-pay | Admitting: Family Medicine

## 2015-05-06 ENCOUNTER — Other Ambulatory Visit: Payer: Self-pay | Admitting: Family Medicine

## 2015-05-06 LAB — CBC WITH DIFFERENTIAL/PLATELET
Basophils Absolute: 0 10*3/uL (ref 0.0–0.1)
Basophils Relative: 0 % (ref 0–1)
Eosinophils Absolute: 0.3 10*3/uL (ref 0.0–0.7)
Eosinophils Relative: 5 % (ref 0–5)
HCT: 37.1 % (ref 36.0–46.0)
Hemoglobin: 12.6 g/dL (ref 12.0–15.0)
Lymphocytes Relative: 26 % (ref 12–46)
Lymphs Abs: 1.4 10*3/uL (ref 0.7–4.0)
MCH: 30.3 pg (ref 26.0–34.0)
MCHC: 34 g/dL (ref 30.0–36.0)
MCV: 89.2 fL (ref 78.0–100.0)
MPV: 9 fL (ref 8.6–12.4)
Monocytes Absolute: 0.5 10*3/uL (ref 0.1–1.0)
Monocytes Relative: 9 % (ref 3–12)
Neutro Abs: 3.2 10*3/uL (ref 1.7–7.7)
Neutrophils Relative %: 60 % (ref 43–77)
Platelets: 321 10*3/uL (ref 150–400)
RBC: 4.16 MIL/uL (ref 3.87–5.11)
RDW: 13.9 % (ref 11.5–15.5)
WBC: 5.4 10*3/uL (ref 4.0–10.5)

## 2015-05-06 LAB — LIPID PANEL
Cholesterol: 127 mg/dL (ref 125–200)
HDL: 33 mg/dL — ABNORMAL LOW (ref >=46)
LDL Cholesterol: 62 mg/dL (ref <130)
Total CHOL/HDL Ratio: 3.8 Ratio (ref <=5.0)
Triglycerides: 160 mg/dL — ABNORMAL HIGH (ref <150)
VLDL: 32 mg/dL — ABNORMAL HIGH (ref <30)

## 2015-05-06 LAB — COMPREHENSIVE METABOLIC PANEL
ALT: 6 U/L (ref 6–29)
AST: 17 U/L (ref 10–35)
Albumin: 4 g/dL (ref 3.6–5.1)
Alkaline Phosphatase: 52 U/L (ref 33–130)
BUN: 21 mg/dL (ref 7–25)
CO2: 26 mmol/L (ref 20–31)
Calcium: 9.5 mg/dL (ref 8.6–10.4)
Chloride: 108 mmol/L (ref 98–110)
Creat: 0.78 mg/dL (ref 0.50–0.99)
Glucose, Bld: 93 mg/dL (ref 65–99)
Potassium: 3.6 mmol/L (ref 3.5–5.3)
Sodium: 144 mmol/L (ref 135–146)
Total Bilirubin: 0.6 mg/dL (ref 0.2–1.2)
Total Protein: 6.7 g/dL (ref 6.1–8.1)

## 2015-05-06 LAB — TSH: TSH: 2.608 u[IU]/mL (ref 0.350–4.500)

## 2015-05-07 LAB — VITAMIN D 25 HYDROXY (VIT D DEFICIENCY, FRACTURES): Vit D, 25-Hydroxy: 31 ng/mL (ref 30–100)

## 2015-05-07 LAB — HEMOGLOBIN A1C
Hgb A1c MFr Bld: 5.4 % (ref <5.7)
Mean Plasma Glucose: 108 mg/dL (ref <117)

## 2015-05-10 ENCOUNTER — Ambulatory Visit (INDEPENDENT_AMBULATORY_CARE_PROVIDER_SITE_OTHER): Payer: Medicare Other | Admitting: Family Medicine

## 2015-05-10 ENCOUNTER — Encounter: Payer: Self-pay | Admitting: Family Medicine

## 2015-05-10 VITALS — BP 120/82 | HR 83 | Resp 16 | Ht 69.0 in | Wt 146.0 lb

## 2015-05-10 DIAGNOSIS — I10 Essential (primary) hypertension: Secondary | ICD-10-CM

## 2015-05-10 DIAGNOSIS — M5410 Radiculopathy, site unspecified: Secondary | ICD-10-CM

## 2015-05-10 DIAGNOSIS — E739 Lactose intolerance, unspecified: Secondary | ICD-10-CM

## 2015-05-10 DIAGNOSIS — E785 Hyperlipidemia, unspecified: Secondary | ICD-10-CM

## 2015-05-10 DIAGNOSIS — H409 Unspecified glaucoma: Secondary | ICD-10-CM

## 2015-05-10 LAB — VITAMIN D 25 HYDROXY (VIT D DEFICIENCY, FRACTURES): Vit D, 25-Hydroxy: 33 ng/mL (ref 30–100)

## 2015-05-10 LAB — HEPATITIS C ANTIBODY: HCV Ab: NEGATIVE

## 2015-05-10 MED ORDER — GABAPENTIN 100 MG PO CAPS: 100.0000 mg | ORAL_CAPSULE | Freq: Every day | ORAL | Status: DC

## 2015-05-10 MED ORDER — CLONIDINE HCL 0.2 MG PO TABS: 0.2000 mg | ORAL_TABLET | Freq: Every day | ORAL | Status: DC

## 2015-05-10 MED ORDER — PRAVASTATIN SODIUM 10 MG PO TABS: ORAL_TABLET | ORAL | Status: DC

## 2015-05-10 NOTE — Progress Notes (Signed)
   Subjective:    Patient ID: Wanda Ross, female    DOB: May 20, 1951, 64 y.o.   MRN: [id number]  HPI   Wanda Ross     MRN: [id number]      DOB: 1950-12-10   HPI Wanda Ross is here for follow up and re-evaluation of chronic medical conditions, medication management and review of any available recent lab and radiology data.  Preventive health is updated, specifically  Cancer screening and Immunization.   Questions or concerns regarding consultations or procedures which the PT has had in the interim are  Addressed.She chose to see a chiropracter and has benefitied The PT denies any adverse reactions to current medications since the last visit.  There are no new concerns.  There are no specific complaints   ROS Denies recent fever or chills. Denies sinus pressure, nasal congestion, ear pain or sore throat. Denies chest congestion, productive cough or wheezing. Denies chest pains, palpitations and leg swelling Denies abdominal pain, nausea, vomiting,diarrhea or constipation.   Denies dysuria, frequency, hesitancy or incontinence. Continued left hip pain and stiffness, after standing for a prolonged period. Denies headaches, seizures, numbness, or tingling. Denies depression, anxiety or insomnia. Denies skin break down or rash.   PE  BP 120/82 mmHg  Pulse 83  Resp 16  Ht 5\' 9"  (1.753 m)  Wt 146 lb (66.225 kg)  BMI 21.55 kg/m2  SpO2 100%  Patient alert and oriented and in no cardiopulmonary distress.  HEENT: No facial asymmetry, EOMI,   oropharynx pink and moist.  Neck supple no JVD, no mass.  Chest: Clear to auscultation bilaterally.  CVS: S1, S2 no murmurs, no S3.Regular rate.  ABD: Soft non tender.   Ext: No edema  MS: Adequate ROM spine, shoulders, hips and knees.  Skin: Intact, no ulcerations or rash noted.  Psych: Good eye contact, normal affect. Memory intact not anxious or depressed appearing.  CNS: CN 2-12 intact, power,  normal throughout.no  focal deficits noted.   Assessment & Plan   Essential hypertension Controlled, no change in medication DASH diet and commitment to daily physical activity for a minimum of 30 minutes discussed and encouraged, as a part of hypertension management. The importance of attaining a healthy weight is also discussed.  BP/Weight 05/10/2015 01/26/2015 10/08/2014 07/16/2014 05/07/2014 12/26/2013 [id number]  Systolic BP 123456 123456 [id number]  Diastolic BP 82 80 72 90 72 80 84  Wt. (Lbs) 146 148.12 147.12 145 146.12 141 145  BMI 21.55 22.19 22.04 22.05 22.22 21.44 22.05         IMPAIRED GLUCOSE TOLERANCE Resolved with lifestyle change   Back pain with left-sided radiculopathy Improved , though still has kleft hip stiffness, wants trial of gabapentin at night  Hyperlipidemia Elevated tG, need to reduce fried and fatty foods No med change  Glaucoma Has laser  Treatment to one eye this week, she has had this done before       Review of Systems     Objective:   Physical Exam        Assessment & Plan:

## 2015-05-10 NOTE — Assessment & Plan Note (Signed)
Controlled, no change in medication DASH diet and commitment to daily physical activity for a minimum of 30 minutes discussed and encouraged, as a part of hypertension management. The importance of attaining a healthy weight is also discussed.  BP/Weight 05/10/2015 01/26/2015 10/08/2014 07/16/2014 05/07/2014 12/26/2013 [id number]  Systolic BP 123456 123456 123456 [id number] [id number]  Diastolic BP 82 80 72 90 72 80 84  Wt. (Lbs) 146 148.12 147.12 145 146.12 141 145  BMI 21.55 22.19 22.04 22.05 22.22 21.44 22.05

## 2015-05-10 NOTE — Assessment & Plan Note (Signed)
Has laser  Treatment to one eye this week, she has had this done before

## 2015-05-10 NOTE — Addendum Note (Signed)
Addended by: Denman George B on: 05/10/2015 08:50 AM   Modules accepted: Orders

## 2015-05-10 NOTE — Patient Instructions (Signed)
Annual wellness early June, call if you need me sooner  Excellent labs and exam, pls reduce butter and cheese and egg yolk since TG are high  Blood sugar now totally normal  NEW for left hip pain is gabapentin one at night   Continue daily exercise and healthy eating habit  Fasting lipid and cmp in 5.5 months

## 2015-05-10 NOTE — Assessment & Plan Note (Signed)
Improved , though still has kleft hip stiffness, wants trial of gabapentin at night

## 2015-05-10 NOTE — Assessment & Plan Note (Signed)
Resolved with lifestyle change. 

## 2015-05-10 NOTE — Assessment & Plan Note (Signed)
Elevated tG, need to reduce fried and fatty foods No med change

## 2015-05-30 HISTORY — PX: EYE SURGERY: SHX253

## 2015-05-31 ENCOUNTER — Other Ambulatory Visit: Payer: Self-pay | Admitting: Family Medicine

## 2015-05-31 DIAGNOSIS — Z1231 Encounter for screening mammogram for malignant neoplasm of breast: Secondary | ICD-10-CM

## 2015-06-09 ENCOUNTER — Ambulatory Visit (HOSPITAL_COMMUNITY)
Admission: RE | Admit: 2015-06-09 | Discharge: 2015-06-09 | Disposition: A | Discharge status: Home / Self Care | Payer: Medicare Other | Source: Ambulatory Visit | Attending: Family Medicine | Admitting: Family Medicine

## 2015-06-09 DIAGNOSIS — Z1231 Encounter for screening mammogram for malignant neoplasm of breast: Secondary | ICD-10-CM | POA: Diagnosis not present

## 2015-07-19 ENCOUNTER — Other Ambulatory Visit: Payer: Self-pay | Admitting: Obstetrics & Gynecology

## 2015-07-26 ENCOUNTER — Ambulatory Visit (INDEPENDENT_AMBULATORY_CARE_PROVIDER_SITE_OTHER): Payer: Medicare Other | Admitting: Obstetrics & Gynecology

## 2015-07-26 ENCOUNTER — Encounter: Payer: Self-pay | Admitting: Obstetrics & Gynecology

## 2015-07-26 ENCOUNTER — Other Ambulatory Visit (HOSPITAL_COMMUNITY)
Admission: RE | Admit: 2015-07-26 | Discharge: 2015-07-26 | Disposition: A | Discharge status: Home / Self Care | Payer: Medicare Other | Source: Ambulatory Visit | Attending: Obstetrics & Gynecology | Admitting: Obstetrics & Gynecology

## 2015-07-26 VITALS — BP 140/80 | HR 84 | Ht 68.0 in | Wt 146.0 lb

## 2015-07-26 DIAGNOSIS — Z124 Encounter for screening for malignant neoplasm of cervix: Secondary | ICD-10-CM | POA: Diagnosis present

## 2015-07-26 DIAGNOSIS — Z1212 Encounter for screening for malignant neoplasm of rectum: Secondary | ICD-10-CM

## 2015-07-26 DIAGNOSIS — Z1211 Encounter for screening for malignant neoplasm of colon: Secondary | ICD-10-CM

## 2015-07-26 DIAGNOSIS — Z01419 Encounter for gynecological examination (general) (routine) without abnormal findings: Secondary | ICD-10-CM | POA: Diagnosis not present

## 2015-07-26 NOTE — Progress Notes (Signed)
Patient ID: Wanda Ross, female   DOB: Apr 12, 1951, 65 y.o.   MRN: [id number] Subjective:     Wanda Ross is a 65 y.o. female here for a routine exam.  No LMP recorded. Patient is postmenopausal. No obstetric history on file. Birth Control Method:  pst menopausal Menstrual Calendar(currently): amenorrheic  Current complaints: none.   Current acute medical issues:  none   Recent Gynecologic History No LMP recorded. Patient is postmenopausal. Last Pap: 2016,  normal Last mammogram: 2017,  normal  Past Medical History  Diagnosis Date  . Hypertension 1990  . Hyperlipidemia 2010  . Multinodular goiter 2010    normal function  . IGT (impaired glucose tolerance) 05/11/2010    History reviewed. No pertinent past surgical history.  OB History    No data available      Social History   Social History  . Marital Status: Widowed    Spouse Name: N/A  . Number of Children: 3  . Years of Education: N/A   Occupational History  . employed     Social History Main Topics  . Smoking status: Never Smoker   . Smokeless tobacco: Never Used  . Alcohol Use: No  . Drug Use: No  . Sexual Activity: Yes    Birth Control/ Protection: Post-menopausal   Other Topics Concern  . None   Social History Narrative    Family History  Problem Relation Age of Onset  . Dementia Father   . Dementia Mother   . Diabetes Mother   . Hypertension Mother   . Cancer Sister     breast  . Hypertension Sister      Current outpatient prescriptions:  .  Aspirin (ADULT ASPIRIN LOW STRENGTH) 81 MG EC tablet, Take 81 mg by mouth daily.  , Disp: , Rfl:  .  bimatoprost (LUMIGAN) 0.01 % SOLN, 1 drop at bedtime.  , Disp: , Rfl:  .  brimonidine-timolol (COMBIGAN) 0.2-0.5 % ophthalmic solution, Place 1 drop into both eyes every 12 (twelve) hours., Disp: , Rfl:  .  brinzolamide (AZOPT) 1 % ophthalmic suspension, Place 1 drop into both eyes 2 (two) times daily., Disp: , Rfl:  .  Calcium  Carbonate-Vit D-Min (CALCIUM 1200) 1200-1000 MG-UNIT CHEW, Chew 1,200 mg by mouth daily.  , Disp: , Rfl:  .  cloNIDine (CATAPRES) 0.2 MG tablet, Take 1 tablet (0.2 mg total) by mouth daily., Disp: 90 tablet, Rfl: 1 .  gabapentin (NEURONTIN) 100 MG capsule, Take 1 capsule (100 mg total) by mouth at bedtime., Disp: 30 capsule, Rfl: 4 .  methazolamide (NEPTAZANE) 50 MG tablet, Take 50 mg by mouth 2 (two) times daily., Disp: , Rfl:  .  Multiple Vitamins-Minerals (CENTRUM SILVER ULTRA WOMENS) TABS, Take by mouth daily.  , Disp: , Rfl:  .  pravastatin (PRAVACHOL) 10 MG tablet, TAKE 1 TABLET BY MOUTH DAILY *DOSE REDUCTION*, Disp: 90 tablet, Rfl: 1 .  valsartan-hydrochlorothiazide (DIOVAN-HCT) 320-25 MG tablet, TAKE 1 TABLET BY MOUTH EVERY DAY, Disp: 90 tablet, Rfl: 1  Review of Systems  Review of Systems  Constitutional: Negative for fever, chills, weight loss, malaise/fatigue and diaphoresis.  HENT: Negative for hearing loss, ear pain, nosebleeds, congestion, sore throat, neck pain, tinnitus and ear discharge.   Eyes: Negative for blurred vision, double vision, photophobia, pain, discharge and redness.  Respiratory: Negative for cough, hemoptysis, sputum production, shortness of breath, wheezing and stridor.   Cardiovascular: Negative for chest pain, palpitations, orthopnea, claudication, leg swelling and PND.  Gastrointestinal: negative for  abdominal pain. Negative for heartburn, nausea, vomiting, diarrhea, constipation, blood in stool and melena.  Genitourinary: Negative for dysuria, urgency, frequency, hematuria and flank pain.  Musculoskeletal: Negative for myalgias, back pain, joint pain and falls.  Skin: Negative for itching and rash.  Neurological: Negative for dizziness, tingling, tremors, sensory change, speech change, focal weakness, seizures, loss of consciousness, weakness and headaches.  Endo/Heme/Allergies: Negative for environmental allergies and polydipsia. Does not bruise/bleed  easily.  Psychiatric/Behavioral: Negative for depression, suicidal ideas, hallucinations, memory loss and substance abuse. The patient is not nervous/anxious and does not have insomnia.        Objective:  Blood pressure 140/80, pulse 84, height 5\' 8"  (1.727 m), weight 146 lb (66.225 kg).   Physical Exam  Vitals reviewed. Constitutional: She is oriented to person, place, and time. She appears well-developed and well-nourished.  HENT:  Head: Normocephalic and atraumatic.        Right Ear: External ear normal.  Left Ear: External ear normal.  Nose: Nose normal.  Mouth/Throat: Oropharynx is clear and moist.  Eyes: Conjunctivae and EOM are normal. Pupils are equal, round, and reactive to light. Right eye exhibits no discharge. Left eye exhibits no discharge. No scleral icterus.  Neck: Normal range of motion. Neck supple. No tracheal deviation present. No thyromegaly present.  Cardiovascular: Normal rate, regular rhythm, normal heart sounds and intact distal pulses.  Exam reveals no gallop and no friction rub.   No murmur heard. Respiratory: Effort normal and breath sounds normal. No respiratory distress. She has no wheezes. She has no rales. She exhibits no tenderness.  GI: Soft. Bowel sounds are normal. She exhibits no distension and no mass. There is no tenderness. There is no rebound and no guarding.  Genitourinary:  Breasts no masses skin changes or nipple changes bilaterally      Vulva is normal without lesions Vagina is pink moist without discharge Cervix normal in appearance and pap is done Uterus is normal size shape and contour Adnexa is negative with normal sized ovaries  {Rectal    hemoccult negative, normal tone, no masses  Musculoskeletal: Normal range of motion. She exhibits no edema and no tenderness.  Neurological: She is alert and oriented to person, place, and time. She has normal reflexes. She displays normal reflexes. No cranial nerve deficit. She exhibits normal muscle  tone. Coordination normal.  Skin: Skin is warm and dry. No rash noted. No erythema. No pallor.  Psychiatric: She has a normal mood and affect. Her behavior is normal. Judgment and thought content normal.       Assessment:    Healthy female exam.    Plan:    Mammogram ordered. Follow up in: 1 year.    No orders of the defined types were placed in this encounter.    No orders of the defined types were placed in this encounter.

## 2015-07-27 LAB — CYTOLOGY - PAP

## 2015-09-25 ENCOUNTER — Other Ambulatory Visit: Payer: Self-pay | Admitting: Family Medicine

## 2015-10-02 ENCOUNTER — Other Ambulatory Visit: Payer: Self-pay | Admitting: Family Medicine

## 2015-10-05 ENCOUNTER — Telehealth: Payer: Self-pay

## 2015-10-05 NOTE — Telephone Encounter (Signed)
Patient presented to office with problems with appt from recent eye procedure.  The doctor that did procedure is unavailable today and that office is yet to find someone to see patient.  Under the direction of pcp contact made with Dr. Iona Hansen in [place] to see if he will see patient.   Dr. Iona Hansen agreed to see patient and appointment time of 3:15 given to patient along with address information.

## 2015-10-27 LAB — LIPID PANEL
Cholesterol: 145 mg/dL (ref 125–200)
HDL: 46 mg/dL (ref >=46)
LDL Cholesterol: 82 mg/dL (ref <130)
Total CHOL/HDL Ratio: 3.2 Ratio (ref <=5.0)
Triglycerides: 85 mg/dL (ref <150)
VLDL: 17 mg/dL (ref <30)

## 2015-10-27 LAB — COMPREHENSIVE METABOLIC PANEL
ALT: 15 U/L (ref 6–29)
AST: 22 U/L (ref 10–35)
Albumin: 4.3 g/dL (ref 3.6–5.1)
Alkaline Phosphatase: 69 U/L (ref 33–130)
BUN: 17 mg/dL (ref 7–25)
CO2: 30 mmol/L (ref 20–31)
Calcium: 9.5 mg/dL (ref 8.6–10.4)
Chloride: 106 mmol/L (ref 98–110)
Creat: 0.81 mg/dL (ref 0.50–0.99)
Glucose, Bld: 94 mg/dL (ref 65–99)
Potassium: 4.3 mmol/L (ref 3.5–5.3)
Sodium: 143 mmol/L (ref 135–146)
Total Bilirubin: 0.5 mg/dL (ref 0.2–1.2)
Total Protein: 6.6 g/dL (ref 6.1–8.1)

## 2015-11-01 ENCOUNTER — Encounter: Payer: Medicare Other | Admitting: Family Medicine

## 2015-11-16 ENCOUNTER — Encounter: Payer: Self-pay | Admitting: Family Medicine

## 2015-11-16 ENCOUNTER — Ambulatory Visit (INDEPENDENT_AMBULATORY_CARE_PROVIDER_SITE_OTHER): Payer: Medicare Other | Admitting: Family Medicine

## 2015-11-16 VITALS — BP 118/74 | HR 84 | Resp 18 | Ht 69.0 in | Wt 151.0 lb

## 2015-11-16 DIAGNOSIS — E042 Nontoxic multinodular goiter: Secondary | ICD-10-CM | POA: Diagnosis not present

## 2015-11-16 DIAGNOSIS — Z0000 Encounter for general adult medical examination without abnormal findings: Secondary | ICD-10-CM | POA: Diagnosis not present

## 2015-11-16 DIAGNOSIS — I10 Essential (primary) hypertension: Secondary | ICD-10-CM

## 2015-11-16 DIAGNOSIS — E559 Vitamin D deficiency, unspecified: Secondary | ICD-10-CM | POA: Diagnosis not present

## 2015-11-16 NOTE — Progress Notes (Signed)
Preventive Screening-Counseling & Management   Patient present here today for a Medicare annual wellness visit.   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   Widowed with 3 children ( 2 sons and 1 daughter)    Risk Factors  Current exercise habits:  Very active walking 5 miles daily alternating treadmill and outside walking   Dietary issues discussed: Heart health low fat diet;  Most meals prepared at home;  Incorporates plenty of fresh fruits and vegetables    Cardiac risk factors:   Depression Screen  (Note: if answer to either of the following is "Yes", a more complete depression screening is indicated)   Over the past two weeks, have you felt down, depressed or hopeless? No  Over the past two weeks, have you felt little interest or pleasure in doing things? No  Have you lost interest or pleasure in daily life? No  Do you often feel hopeless? No  Do you cry easily over simple problems? No   Activities of Daily Living  In your present state of health, do you have any difficulty performing the following activities?  Driving?: No Managing money?: No Feeding yourself?:No Getting from bed to chair?:No Climbing a flight of stairs?:No Preparing food and eating?:No Bathing or showering?:No Getting dressed?:No Getting to the toilet?:No Using the toilet?:No Moving around from place to place?: No  Fall Risk Assessment In the past year have you fallen or had a near fall?:No Are you currently taking any medications that make you dizzy?:No   Hearing Difficulties: No Do you often ask people to speak up or repeat themselves?:No Do you experience ringing or noises in your ears?:No Do you have difficulty understanding soft or whispered voices?:No  Cognitive Testing  Alert? Yes Normal Appearance?Yes   Oriented to person? Yes Place? Yes  Time? Yes  Displays appropriate judgment?Yes  Can read the correct time from a watch face? yes Are you having problems remembering things?No age appropriate   Advanced Directives have been discussed with the patient?Yes and brochure/forms provided    List the Names of Other Physician/Practitioners you currently use: care teams up to date   Indicate any recent Medical Services you may have received from other than [facility] providers in the past year (date may be approximate).   Assessment:    Annual Wellness Exam   Plan:     Medicare Attestation  I have personally reviewed:  The patient's medical and social history  Their use of alcohol, tobacco or illicit drugs  Their current medications and supplements  The patient's functional ability including ADLs,fall risks, home safety risks, cognitive, and hearing and visual impairment  Diet and physical activities  Evidence for depression or mood disorders  The patient's weight, height, BMI, and visual acuity have been recorded in the chart. I have made referrals, counseling, and provided education to the patient based on review of the above and I have provided the patient with a written personalized care plan for preventive services.

## 2015-11-16 NOTE — Patient Instructions (Signed)
F/u December 10 or after , call if you need me sooner  CBC, chem 7 , tSH , vit D , fasting Dec 8 or after  Come for flu vaccine September or after  [facility] on excellent labs and health habits, keep it up  All the best with left eye  Fall Prevention in the Home  Falls can cause injuries. They can happen to people of all ages. There are many things you can do to make your home safe and to help prevent falls.  WHAT CAN I DO ON THE OUTSIDE OF MY HOME?  Regularly fix the edges of walkways and driveways and fix any cracks.  Remove anything that might make you trip as you walk through a door, such as a raised step or threshold.  Trim any bushes or trees on the path to your home.  Use bright outdoor lighting.  Clear any walking paths of anything that might make someone trip, such as rocks or tools.  Regularly check to see if handrails are loose or broken. Make sure that both sides of any steps have handrails.  Any raised decks and porches should have guardrails on the edges.  Have any leaves, snow, or ice cleared regularly.  Use sand or salt on walking paths during winter.  Clean up any spills in your garage right away. This includes oil or grease spills. WHAT CAN I DO IN THE BATHROOM?   Use night lights.  Install grab bars by the toilet and in the tub and shower. Do not use towel bars as grab bars.  Use non-skid mats or decals in the tub or shower.  If you need to sit down in the shower, use a plastic, non-slip stool.  Keep the floor dry. Clean up any water that spills on the floor as soon as it happens.  Remove soap buildup in the tub or shower regularly.  Attach bath mats securely with double-sided non-slip rug tape.  Do not have throw rugs and other things on the floor that can make you trip. WHAT CAN I DO IN THE BEDROOM?  Use night lights.  Make sure that you have a light by your bed that is easy to reach.  Do not use any sheets or blankets that are too big for  your bed. They should not hang down onto the floor.  Have a firm chair that has side arms. You can use this for support while you get dressed.  Do not have throw rugs and other things on the floor that can make you trip. WHAT CAN I DO IN THE KITCHEN?  Clean up any spills right away.  Avoid walking on wet floors.  Keep items that you use a lot in easy-to-reach places.  If you need to reach something above you, use a strong step stool that has a grab bar.  Keep electrical cords out of the way.  Do not use floor polish or wax that makes floors slippery. If you must use wax, use non-skid floor wax.  Do not have throw rugs and other things on the floor that can make you trip. WHAT CAN I DO WITH MY STAIRS?  Do not leave any items on the stairs.  Make sure that there are handrails on both sides of the stairs and use them. Fix handrails that are broken or loose. Make sure that handrails are as long as the stairways.  Check any carpeting to make sure that it is firmly attached to the stairs. Fix any  carpet that is loose or worn.  Avoid having throw rugs at the top or bottom of the stairs. If you do have throw rugs, attach them to the floor with carpet tape.  Make sure that you have a light switch at the top of the stairs and the bottom of the stairs. If you do not have them, ask someone to add them for you. WHAT ELSE CAN I DO TO HELP PREVENT FALLS?  Wear shoes that:  Do not have high heels.  Have rubber bottoms.  Are comfortable and fit you well.  Are closed at the toe. Do not wear sandals.  If you use a stepladder:  Make sure that it is fully opened. Do not climb a closed stepladder.  Make sure that both sides of the stepladder are locked into place.  Ask someone to hold it for you, if possible.  Clearly mark and make sure that you can see:  Any grab bars or handrails.  First and last steps.  Where the edge of each step is.  Use tools that help you move around  (mobility aids) if they are needed. These include:  Canes.  Walkers.  Scooters.  Crutches.  Turn on the lights when you go into a dark area. Replace any light bulbs as soon as they burn out.  Set up your furniture so you have a clear path. Avoid moving your furniture around.  If any of your floors are uneven, fix them.  If there are any pets around you, be aware of where they are.  Review your medicines with your doctor. Some medicines can make you feel dizzy. This can increase your chance of falling. Ask your doctor what other things that you can do to help prevent falls.   This information is not intended to replace advice given to you by your health care provider. Make sure you discuss any questions you have with your health care provider.   Document Released: 03/11/2009 Document Revised: 09/29/2014 Document Reviewed: 06/19/2014 Elsevier Interactive Patient Education 2016 [employer].   Thank you  for choosing [facility]. We consider it a privelige to serve you.  Delivering excellent health care in a caring and  compassionate way is our goal.  Partnering with you,  so that together we can achieve this goal is our strategy.

## 2015-11-16 NOTE — Assessment & Plan Note (Signed)

## 2015-12-16 ENCOUNTER — Ambulatory Visit (INDEPENDENT_AMBULATORY_CARE_PROVIDER_SITE_OTHER): Payer: Medicare Other | Admitting: Otolaryngology

## 2015-12-16 DIAGNOSIS — D440 Neoplasm of uncertain behavior of thyroid gland: Secondary | ICD-10-CM | POA: Diagnosis not present

## 2015-12-21 ENCOUNTER — Other Ambulatory Visit: Payer: Self-pay | Admitting: Family Medicine

## 2016-02-10 ENCOUNTER — Ambulatory Visit (INDEPENDENT_AMBULATORY_CARE_PROVIDER_SITE_OTHER): Payer: Medicare Other

## 2016-02-10 DIAGNOSIS — Z23 Encounter for immunization: Secondary | ICD-10-CM

## 2016-02-28 ENCOUNTER — Other Ambulatory Visit: Payer: Self-pay | Admitting: Family Medicine

## 2016-03-29 ENCOUNTER — Other Ambulatory Visit: Payer: Self-pay | Admitting: Family Medicine

## 2016-05-02 ENCOUNTER — Telehealth: Payer: Self-pay | Admitting: Gastroenterology

## 2016-05-02 NOTE — Telephone Encounter (Signed)
Letter mailed to pt.  

## 2016-05-02 NOTE — Telephone Encounter (Signed)
Recall for tcs °

## 2016-05-08 ENCOUNTER — Other Ambulatory Visit: Payer: Self-pay | Admitting: Family Medicine

## 2016-05-08 LAB — CBC
HCT: 42.8 % (ref 35.0–45.0)
Hemoglobin: 14.4 g/dL (ref 11.7–15.5)
MCH: 28.6 pg (ref 27.0–33.0)
MCHC: 33.6 g/dL (ref 32.0–36.0)
MCV: 85.1 fL (ref 80.0–100.0)
MPV: 8.9 fL (ref 7.5–12.5)
Platelets: 322 10*3/uL (ref 140–400)
RBC: 5.03 MIL/uL (ref 3.80–5.10)
RDW: 14.3 % (ref 11.0–15.0)
WBC: 4.1 10*3/uL (ref 3.8–10.8)

## 2016-05-09 LAB — BASIC METABOLIC PANEL
BUN: 21 mg/dL (ref 7–25)
CO2: 31 mmol/L (ref 20–31)
Calcium: 9.8 mg/dL (ref 8.6–10.4)
Chloride: 102 mmol/L (ref 98–110)
Creat: 0.86 mg/dL (ref 0.50–0.99)
Glucose, Bld: 79 mg/dL (ref 65–99)
Potassium: 4.4 mmol/L (ref 3.5–5.3)
Sodium: 140 mmol/L (ref 135–146)

## 2016-05-09 LAB — TSH: TSH: 2.29 mIU/L

## 2016-05-09 LAB — VITAMIN D 25 HYDROXY (VIT D DEFICIENCY, FRACTURES): Vit D, 25-Hydroxy: 33 ng/mL (ref 30–100)

## 2016-05-10 ENCOUNTER — Encounter: Payer: Self-pay | Admitting: Family Medicine

## 2016-05-10 ENCOUNTER — Ambulatory Visit (INDEPENDENT_AMBULATORY_CARE_PROVIDER_SITE_OTHER): Payer: Medicare Other | Admitting: Family Medicine

## 2016-05-10 VITALS — BP 130/82 | HR 95 | Resp 16 | Ht 68.0 in | Wt 148.0 lb

## 2016-05-10 DIAGNOSIS — I10 Essential (primary) hypertension: Secondary | ICD-10-CM

## 2016-05-10 DIAGNOSIS — E739 Lactose intolerance, unspecified: Secondary | ICD-10-CM | POA: Diagnosis not present

## 2016-05-10 DIAGNOSIS — M5410 Radiculopathy, site unspecified: Secondary | ICD-10-CM

## 2016-05-10 DIAGNOSIS — E784 Other hyperlipidemia: Secondary | ICD-10-CM | POA: Diagnosis not present

## 2016-05-10 DIAGNOSIS — E7849 Other hyperlipidemia: Secondary | ICD-10-CM

## 2016-05-10 LAB — HEPATIC FUNCTION PANEL
ALT: 14 U/L (ref 6–29)
AST: 25 U/L (ref 10–35)
Albumin: 4.4 g/dL (ref 3.6–5.1)
Alkaline Phosphatase: 76 U/L (ref 33–130)
Bilirubin, Direct: 0.1 mg/dL (ref <=0.2)
Indirect Bilirubin: 0.5 mg/dL (ref 0.2–1.2)
Total Bilirubin: 0.6 mg/dL (ref 0.2–1.2)
Total Protein: 6.9 g/dL (ref 6.1–8.1)

## 2016-05-10 LAB — LIPID PANEL
Cholesterol: 134 mg/dL (ref <200)
HDL: 42 mg/dL — ABNORMAL LOW (ref >50)
LDL Cholesterol: 81 mg/dL (ref <100)
Total CHOL/HDL Ratio: 3.2 Ratio (ref <5.0)
Triglycerides: 56 mg/dL (ref <150)
VLDL: 11 mg/dL (ref <30)

## 2016-05-10 NOTE — Progress Notes (Signed)
Wanda Ross     MRN: [id number]      DOB: [birth year]   HPI Wanda Ross is here for follow up and re-evaluation of chronic medical conditions, medication management and review of any available recent lab and radiology data.  Preventive health is updated, specifically  Cancer screening and Immunization.   Questions or concerns regarding consultations or procedures which the PT has had in the interim are  Addressed.Very please with current health  Of her vision, has new Provider also The PT denies any adverse reactions to current medications since the last visit. Gabapentin has been great for sciatic pain, she is advised to try to discontinue the medication, and use only if she has a flare of her sciatic pain There are no new concerns.  There are no specific complaints   ROS Denies recent fever or chills. Denies sinus pressure, nasal congestion, ear pain or sore throat. Denies chest congestion, productive cough or wheezing. Denies chest pains, palpitations and leg swelling Denies abdominal pain, nausea, vomiting,diarrhea or constipation.   Denies dysuria, frequency, hesitancy or incontinence. Denies joint pain, swelling and limitation in mobility. Denies headaches, seizures, numbness, or tingling. Denies depression, anxiety or insomnia. Denies skin break down or rash.   PE  BP 130/82   Pulse 95   Resp 16   Ht 5\' 8"  (1.727 m)   Wt 148 lb (67.1 kg)   SpO2 98%   BMI 22.50 kg/m   Patient alert and oriented and in no cardiopulmonary distress.  HEENT: No facial asymmetry, EOMI,   oropharynx pink and moist.  Neck supple no JVD, no mass.  Chest: Clear to auscultation bilaterally.  CVS: S1, S2 no murmurs, no S3.Regular rate.  ABD: Soft non tender.   Ext: No edema  MS: Adequate ROM spine, shoulders, hips and knees.  Skin: Intact, no ulcerations or rash noted.  Psych: Good eye contact, normal affect. Memory intact not anxious or depressed appearing.  CNS: CN 2-12  intact, power,  normal throughout.no focal deficits noted.   Assessment & Plan  Essential hypertension Controlled, no change in medication DASH diet and commitment to daily physical activity for a minimum of 30 minutes discussed and encouraged, as a part of hypertension management. The importance of attaining a healthy weight is also discussed.  BP/Weight 05/10/2016 11/16/2015 07/26/2015 05/10/2015 01/26/2015 10/08/2014 [id number]  Systolic BP [id number] 123456 123456 [id number]  Diastolic BP 82 74 80 82 80 72 90  Wt. (Lbs) 148 151 146 146 148.12 147.12 145  BMI 22.5 22.29 22.2 21.55 22.19 22.04 22.05       IMPAIRED GLUCOSE TOLERANCE Patient educated about the importance of limiting  Carbohydrate intake , the need to commit to daily physical activity for a minimum of 30 minutes , and to commit weight loss. The fact that changes in all these areas will reduce or eliminate all together the development of diabetes is stressed.   Diabetic Labs Latest Ref Rng & Units 05/08/2016 10/27/2015 05/06/2015 10/06/2014 05/05/2014  HbA1c <5.7 % - - 5.4 - 5.5  Chol 125 - 200 mg/dL - 145 127 128 118  HDL >=46 mg/dL - 46 33(L) 44(L) 33(L)  Calc LDL <130 mg/dL - 82 62 64 62  Triglycerides <150 mg/dL - 85 160(H) 98 114  Creatinine 0.50 - 0.99 mg/dL 0.86 0.81 0.78 0.82 0.75   BP/Weight 05/10/2016 11/16/2015 07/26/2015 05/10/2015 01/26/2015 10/08/2014 [id number]  Systolic BP [id number] 123456 123456 [id number]  Diastolic BP 82 74  80 82 80 72 90  Wt. (Lbs) 148 151 146 146 148.12 147.12 145  BMI 22.5 22.29 22.2 21.55 22.19 22.04 22.05   No flowsheet data found. Updated lab needed at/ before next visit.    Back pain with left-sided radiculopathy Improved and resolved, wean off of gabapentin  Hyperlipidemia Hyperlipidemia:Low fat diet discussed and encouraged.   Lipid Panel  Lab Results  Component Value Date   CHOL 145 10/27/2015   HDL 46 10/27/2015   LDLCALC 82 10/27/2015   TRIG 85 10/27/2015   CHOLHDL 3.2  10/27/2015   Updated lab needed at/ before next visit.

## 2016-05-10 NOTE — Assessment & Plan Note (Signed)
Patient educated about the importance of limiting  Carbohydrate intake , the need to commit to daily physical activity for a minimum of 30 minutes , and to commit weight loss. The fact that changes in all these areas will reduce or eliminate all together the development of diabetes is stressed.   Diabetic Labs Latest Ref Rng & Units 05/08/2016 10/27/2015 05/06/2015 10/06/2014 05/05/2014  HbA1c <5.7 % - - 5.4 - 5.5  Chol 125 - 200 mg/dL - 145 127 128 118  HDL >=46 mg/dL - 46 33(L) 44(L) 33(L)  Calc LDL <130 mg/dL - 82 62 64 62  Triglycerides <150 mg/dL - 85 160(H) 98 114  Creatinine 0.50 - 0.99 mg/dL 0.86 0.81 0.78 0.82 0.75   BP/Weight 05/10/2016 11/16/2015 07/26/2015 05/10/2015 01/26/2015 10/08/2014 [id number]  Systolic BP [id number] 123456 123456 123456 [id number]  Diastolic BP 82 74 80 82 80 72 90  Wt. (Lbs) 148 151 146 146 148.12 147.12 145  BMI 22.5 22.29 22.2 21.55 22.19 22.04 22.05   No flowsheet data found. Updated lab needed at/ before next visit.

## 2016-05-10 NOTE — Patient Instructions (Signed)
Annual wellness November 16, 2016 or after, call if you need me sooner   Pls try to stop gabapentin every night , keep on hand for as needed use  Congrats on great health habits, you are reaping the rewards   Log onto my chart today please and send me a message, I will release a message re cholesterol and liver tests to you by tomorrow  Please work on good  health habits so that your health will improve. 1. Commitment to daily physical activity for 30 to 60  minutes, if you are able to do this.  2. Commitment to wise food choices. Aim for half of your  food intake to be vegetable and fruit, one quarter starchy foods, and one quarter protein. Try to eat on a regular schedule  3 meals per day, snacking between meals should be limited to vegetables or fruits or small portions of nuts. 64 ounces of water per day is generally recommended, unless you have specific health conditions, like heart failure or kidney failure where you will need to limit fluid intake.  3. Commitment to sufficient and a  good quality of physical and mental rest daily, generally between 6 to 8 hours per day.  WITH PERSISTANCE AND PERSEVERANCE, THE IMPOSSIBLE , BECOMES THE NORM!   Thank you  for choosing [facility]. We consider it a privelige to serve you.  Delivering excellent health care in a caring and  compassionate way is our goal.  Partnering with you,  so that together we can achieve this goal is our strategy.

## 2016-05-10 NOTE — Assessment & Plan Note (Signed)
Improved and resolved, wean off of gabapentin

## 2016-05-10 NOTE — Assessment & Plan Note (Signed)
Hyperlipidemia:Low fat diet discussed and encouraged.   Lipid Panel  Lab Results  Component Value Date   CHOL 145 10/27/2015   HDL 46 10/27/2015   LDLCALC 82 10/27/2015   TRIG 85 10/27/2015   CHOLHDL 3.2 10/27/2015   Updated lab needed at/ before next visit.

## 2016-05-10 NOTE — Assessment & Plan Note (Signed)
Controlled, no change in medication DASH diet and commitment to daily physical activity for a minimum of 30 minutes discussed and encouraged, as a part of hypertension management. The importance of attaining a healthy weight is also discussed.  BP/Weight 05/10/2016 11/16/2015 07/26/2015 05/10/2015 01/26/2015 10/08/2014 [id number]  Systolic BP [id number] 123456 123456 [id number]  Diastolic BP 82 74 80 82 80 72 90  Wt. (Lbs) 148 151 146 146 148.12 147.12 145  BMI 22.5 22.29 22.2 21.55 22.19 22.04 22.05

## 2016-05-18 ENCOUNTER — Telehealth: Payer: Self-pay

## 2016-05-18 NOTE — Telephone Encounter (Signed)
Pt received triage letter from DS. Please call her at [phone]

## 2016-05-25 NOTE — Telephone Encounter (Signed)
LMOM to call.

## 2016-05-29 HISTORY — PX: EYE SURGERY: SHX253

## 2016-05-30 ENCOUNTER — Other Ambulatory Visit: Payer: Self-pay | Admitting: Family Medicine

## 2016-05-30 ENCOUNTER — Telehealth: Payer: Self-pay

## 2016-05-30 ENCOUNTER — Telehealth: Payer: Self-pay | Admitting: Obstetrics & Gynecology

## 2016-05-30 DIAGNOSIS — Z1231 Encounter for screening mammogram for malignant neoplasm of breast: Secondary | ICD-10-CM

## 2016-05-30 NOTE — Telephone Encounter (Signed)
Spoke with patient and informed her that she would not need another PAP until 2020. She states she will get her yearly physical at her primary doctors office.

## 2016-05-30 NOTE — Telephone Encounter (Signed)
Triaged today. See separate triage.

## 2016-06-01 NOTE — Telephone Encounter (Signed)
Gastroenterology Pre-Procedure Review  Request Date: 05/30/2016 Requesting Physician:  ON RECALL  PATIENT REVIEW QUESTIONS: The patient responded to the following health history questions as indicated:    1. Diabetes Melitis: no 2. Joint replacements in the past 12 months: no 3. Major health problems in the past 3 months: no 4. Has an artificial valve or MVP: no 5. Has a defibrillator: no 6. Has been advised in past to take antibiotics in advance of a procedure like teeth cleaning: no 7. Family history of colon cancer: no  8. Alcohol Use: no 9. History of sleep apnea: no  10. History of coronary artery or other vascular stents placed within the last 12 months: no    MEDICATIONS & ALLERGIES:    Patient reports the following regarding taking any blood thinners:   Plavix? no Aspirin? yes Coumadin? no  Brilinta? no Xarelto? no Eliquis? no Pradaxa? no Savaysa? no Effient? no  Patient confirms/reports the following medications:  Current Outpatient Prescriptions  Medication Sig Dispense Refill  . Aspirin (ADULT ASPIRIN LOW STRENGTH) 81 MG EC tablet Take 81 mg by mouth daily.      . Calcium Carbonate-Vit D-Min (CALCIUM 1200) 1200-1000 MG-UNIT CHEW Chew 1,200 mg by mouth daily.      . Multiple Vitamins-Minerals (CENTRUM SILVER ULTRA WOMENS) TABS Take by mouth daily.      . pravastatin (PRAVACHOL) 10 MG tablet TAKE 1 TABLET BY MOUTH DAILY *DOSE REDUCTION* 90 tablet 1  . valsartan-hydrochlorothiazide (DIOVAN-HCT) 320-25 MG tablet TAKE 1 TABLET BY MOUTH EVERY DAY 90 tablet 1  . cloNIDine (CATAPRES) 0.2 MG tablet Take 1 tablet (0.2 mg total) by mouth daily. (Patient not taking: Reported on 05/30/2016) 90 tablet 1  . gabapentin (NEURONTIN) 100 MG capsule TAKE 1 CAPSULE (100 MG TOTAL) BY MOUTH AT BEDTIME. (Patient not taking: Reported on 05/30/2016) 30 capsule 4   No current facility-administered medications for this visit.     Patient confirms/reports the following allergies:  No Known  Allergies  No orders of the defined types were placed in this encounter.   AUTHORIZATION INFORMATION Primary Insurance:   ID #:  Group #:  Pre-Cert / Auth required:  Pre-Cert / Auth #:   Secondary Insurance:   ID #:  Group #:  Pre-Cert / Auth required:  Pre-Cert / Auth #:   SCHEDULE INFORMATION: Procedure has been scheduled as follows:  Date:  06/16/2016          Time:  9:30 am Location: [facility] Short Stay  This Gastroenterology Pre-Precedure Review Form is being routed to the following provider(s): Barney Drain, MD

## 2016-06-06 ENCOUNTER — Other Ambulatory Visit: Payer: Self-pay

## 2016-06-06 DIAGNOSIS — Z1211 Encounter for screening for malignant neoplasm of colon: Secondary | ICD-10-CM

## 2016-06-06 MED ORDER — NA SULFATE-K SULFATE-MG SULF 17.5-3.13-1.6 GM/177ML PO SOLN: 1.0000 | ORAL | 0 refills | Status: DC

## 2016-06-06 NOTE — Telephone Encounter (Signed)

## 2016-06-06 NOTE — Telephone Encounter (Signed)
Rx sent to the pharmacy and instructions mailed to pt.  

## 2016-06-09 ENCOUNTER — Ambulatory Visit (HOSPITAL_COMMUNITY)
Admission: RE | Admit: 2016-06-09 | Discharge: 2016-06-09 | Disposition: A | Discharge status: Home / Self Care | Payer: Medicare Other | Source: Ambulatory Visit | Attending: Family Medicine | Admitting: Family Medicine

## 2016-06-09 DIAGNOSIS — Z1231 Encounter for screening mammogram for malignant neoplasm of breast: Secondary | ICD-10-CM | POA: Diagnosis present

## 2016-06-12 ENCOUNTER — Telehealth: Payer: Self-pay

## 2016-06-12 NOTE — Telephone Encounter (Signed)
I called [employer] @ [phone] and spoke to Merwyn Katos who said a PA is not required for the screening colonoscopy as out patient at [facility].

## 2016-06-16 ENCOUNTER — Encounter (HOSPITAL_COMMUNITY)
Admission: RE | Disposition: A | Discharge status: Home Health | Payer: Self-pay | Source: Ambulatory Visit | Attending: Gastroenterology

## 2016-06-16 ENCOUNTER — Ambulatory Visit (HOSPITAL_COMMUNITY)
Admission: RE | Admit: 2016-06-16 | Discharge: 2016-06-16 | Disposition: A | Discharge status: Home Health | Payer: Medicare Other | Source: Ambulatory Visit | Attending: Gastroenterology | Admitting: Gastroenterology

## 2016-06-16 ENCOUNTER — Encounter (HOSPITAL_COMMUNITY): Payer: Self-pay

## 2016-06-16 DIAGNOSIS — Z1211 Encounter for screening for malignant neoplasm of colon: Secondary | ICD-10-CM | POA: Diagnosis not present

## 2016-06-16 DIAGNOSIS — D122 Benign neoplasm of ascending colon: Secondary | ICD-10-CM

## 2016-06-16 DIAGNOSIS — K648 Other hemorrhoids: Secondary | ICD-10-CM | POA: Diagnosis not present

## 2016-06-16 DIAGNOSIS — Z7982 Long term (current) use of aspirin: Secondary | ICD-10-CM | POA: Diagnosis not present

## 2016-06-16 DIAGNOSIS — K644 Residual hemorrhoidal skin tags: Secondary | ICD-10-CM | POA: Insufficient documentation

## 2016-06-16 DIAGNOSIS — H409 Unspecified glaucoma: Secondary | ICD-10-CM | POA: Diagnosis not present

## 2016-06-16 DIAGNOSIS — D123 Benign neoplasm of transverse colon: Secondary | ICD-10-CM

## 2016-06-16 DIAGNOSIS — D124 Benign neoplasm of descending colon: Secondary | ICD-10-CM

## 2016-06-16 DIAGNOSIS — I10 Essential (primary) hypertension: Secondary | ICD-10-CM | POA: Insufficient documentation

## 2016-06-16 DIAGNOSIS — Z1212 Encounter for screening for malignant neoplasm of rectum: Secondary | ICD-10-CM | POA: Diagnosis not present

## 2016-06-16 DIAGNOSIS — E785 Hyperlipidemia, unspecified: Secondary | ICD-10-CM | POA: Insufficient documentation

## 2016-06-16 HISTORY — PX: COLONOSCOPY: SHX5424

## 2016-06-16 SURGERY — COLONOSCOPY
Anesthesia: Moderate Sedation

## 2016-06-16 MED ORDER — MIDAZOLAM HCL 5 MG/5ML IJ SOLN
INTRAMUSCULAR | Status: DC | PRN
  Administered 2016-06-16 (×2): 2 mg via INTRAVENOUS

## 2016-06-16 MED ORDER — MIDAZOLAM HCL 5 MG/5ML IJ SOLN
INTRAMUSCULAR | Status: AC
  Filled 2016-06-16: qty 10

## 2016-06-16 MED ORDER — MEPERIDINE HCL 100 MG/ML IJ SOLN
INTRAMUSCULAR | Status: DC | PRN
  Administered 2016-06-16 (×2): 25 mg via INTRAVENOUS

## 2016-06-16 MED ORDER — MEPERIDINE HCL 100 MG/ML IJ SOLN
INTRAMUSCULAR | Status: AC
  Filled 2016-06-16: qty 2

## 2016-06-16 MED ORDER — SODIUM CHLORIDE 0.9 % IV SOLN
INTRAVENOUS | Status: DC
  Administered 2016-06-16: 09:00:00 via INTRAVENOUS

## 2016-06-16 MED ORDER — STERILE WATER FOR IRRIGATION IR SOLN
Status: DC | PRN
  Administered 2016-06-16: 2.5 mL

## 2016-06-16 NOTE — H&P (Signed)
  Primary Care Physician:  Tula Nakayama, MD Primary Gastroenterologist:  Dr. Oneida Alar  Pre-Procedure History & Physical: HPI:  Wanda Ross is a 66 y.o. female here for [place].  Past Medical History:  Diagnosis Date  . Glaucoma   . Hyperlipidemia 2010  . Hypertension 1990  . IGT (impaired glucose tolerance) 05/11/2010  . Multinodular goiter 2010   normal function    Past Surgical History:  Procedure Laterality Date  . EYE SURGERY  2017   3 on left eye 1 on right eye for glaucoma by Dr. Venetia Maxon     Prior to Admission medications   Medication Sig Start Date End Date Taking? Authorizing Provider  Aspirin (ADULT ASPIRIN LOW STRENGTH) 81 MG EC tablet Take 81 mg by mouth daily.     Yes Historical Provider, MD  Calcium Carbonate-Vit D-Min (CALCIUM 1200) 1200-1000 MG-UNIT CHEW Chew 1,200 mg by mouth daily.     Yes Historical Provider, MD  cloNIDine (CATAPRES) 0.2 MG tablet Take 0.2 mg by mouth at bedtime.   Yes Historical Provider, MD  Multiple Vitamins-Minerals (CENTRUM SILVER ULTRA WOMENS) TABS Take 1 tablet by mouth daily.    Yes Historical Provider, MD  pravastatin (PRAVACHOL) 10 MG tablet TAKE 1 TABLET BY MOUTH DAILY *DOSE REDUCTION* 12/21/15  Yes Fayrene Helper, MD  valsartan-hydrochlorothiazide (DIOVAN-HCT) 320-25 MG tablet TAKE 1 TABLET BY MOUTH EVERY DAY 03/29/16  Yes Fayrene Helper, MD    Allergies as of 06/06/2016  . (No Known Allergies)    Family History  Problem Relation Age of Onset  . Dementia Father   . Dementia Mother   . Diabetes Mother   . Hypertension Mother   . Cancer Sister     breast  . Hypertension Sister     Social History   Social History  . Marital status: Widowed    Spouse name: N/A  . Number of children: 3  . Years of education: N/A   Occupational History  . employed     Social History Main Topics  . Smoking status: Never Smoker  . Smokeless tobacco: Never Used  . Alcohol use No  . Drug use: No  .  Sexual activity: Yes    Birth control/ protection: Post-menopausal   Other Topics Concern  . Not on file   Social History Narrative  . No narrative on file    Review of Systems: See HPI, otherwise negative ROS   Physical Exam: BP (!) 149/77   Pulse 93   Temp 98 F (36.7 C) (Oral)   Resp 12   Ht 5\' 8"  (1.727 m)   Wt 147 lb (66.7 kg)   SpO2 100%   BMI 22.35 kg/m  General:   Alert,  pleasant and cooperative in NAD Head:  Normocephalic and atraumatic. Neck:  Supple; Lungs:  Clear throughout to auscultation.    Heart:  Regular rate and rhythm. Abdomen:  Soft, nontender and nondistended. Normal bowel sounds, without guarding, and without rebound.   Neurologic:  Alert and  oriented x4;  grossly normal neurologically.  Impression/Plan:     SCREENING  Plan:  1. TCS TODAY. DISCUSSED PROCEDURE, BENEFITS, & RISKS: < 1% chance of medication reaction, bleeding, perforation, or rupture of spleen/liver.

## 2016-06-16 NOTE — Op Note (Signed)
[facility] Patient Name: Wanda Ross Procedure Date: 06/16/2016 10:21 AM MRN: [id number] Date of Birth: April 22, 1951 Attending MD: Barney Drain , MD CSN: [id number] Age: 66 Admit Type: Outpatient Procedure:                Colonoscopy with COLD FORCEPS AND SNARE POLYPECTOMY Indications:              Screening for colorectal malignant neoplasm Providers:                Barney Drain, MD, Charlyne Petrin RN, RN, Sherlyn Lees, Technician Referring MD:             Norwood Levo. Folk MD, MD Medicines:                Meperidine 50 mg IV, Midazolam 4 mg IV Complications:            No immediate complications. Estimated Blood Loss:     Estimated blood loss was minimal. Procedure:                Pre-Anesthesia Assessment:                           - Prior to the procedure, a History and Physical                            was performed, and patient medications and                            allergies were reviewed. The patient's tolerance of                            previous anesthesia was also reviewed. The risks                            and benefits of the procedure and the sedation                            options and risks were discussed with the patient.                            All questions were answered, and informed consent                            was obtained. Prior Anticoagulants: The patient has                            taken aspirin. ASA Grade Assessment: II - A patient                            with mild systemic disease. After reviewing the                            risks and benefits, the patient was deemed in  satisfactory condition to undergo the procedure.                            After obtaining informed consent, the colonoscope                            was passed under direct vision. Throughout the                            procedure, the patient's blood pressure, pulse, and              oxygen saturations were monitored continuously. The                            [id number] scope was introduced through                            the anus and advanced to the the cecum, identified                            by appendiceal orifice and ileocecal valve. The                            ileocecal valve, appendiceal orifice, and rectum                            were photographed. The colonoscopy was somewhat                            difficult due to a tortuous colon. Successful                            completion of the procedure was aided by COLOWRAP.                            The patient tolerated the procedure well. The                            quality of the bowel preparation was excellent. Scope In: 10:37:08 AM Scope Out: 11:01:32 AM Scope Withdrawal Time: 0 hours 21 minutes 48 seconds  Total Procedure Duration: 0 hours 24 minutes 24 seconds  Findings:      The perianal exam findings include non-thrombosed external hemorrhoids.      Three sessile polyps were found in the descending colon and hepatic       flexure. The polyps were 4 to 12 mm in size. These polyps were removed       with a hot snare. Resection and retrieval were complete. To prevent       bleeding after the polypectomy, one hemostatic clip was successfully       placed (MR conditional). There was no bleeding at the end of the       procedure.      Two sessile polyps were found in the ascending colon, proximal ascending       colon and mid ascending colon. The polyps were 2 to 3  mm in size. These       polyps were removed with a cold biopsy forceps. Resection and retrieval       were complete.      Internal hemorrhoids were found during retroflexion. The hemorrhoids       were small. Impression:               - Non-thrombosed external hemorrhoids found on                            perianal exam.                           - Three 4 to 12 mm polyps in the descending colon                             and at the hepatic flexure(2: 6 MM, 1.2 CM),                            removed with a hot snare. ONE Resected and                            retrieved VIA ROTH NET. Clip (MR conditional) was                            placed IN THE HEPATIC FLEXURE.                           - Two 2 to 3 mm polyps in the ascending colon, in                            the proximal ascending colon and in the mid                            ascending colon, removed with a cold biopsy                            forceps. Resected and retrieved.                           - Internal hemorrhoids. Moderate Sedation:      Moderate (conscious) sedation was administered by the endoscopy nurse       and supervised by the endoscopist. The following parameters were       monitored: oxygen saturation, heart rate, blood pressure, and response       to care. Total physician intraservice time was 38 minutes. Recommendation:           - High fiber diet.                           - Continue present medications.                           - Await pathology results.                           -  Repeat colonoscopy 1-3 YEARS for surveillance.                           - Patient has a contact number available for                            emergencies. The signs and symptoms of potential                            delayed complications were discussed with the                            patient. Return to normal activities tomorrow.                            Written discharge instructions were provided to the                            patient. Procedure Code(s):        --- Professional ---                           [phone], Colonoscopy, flexible; with removal of                            tumor(s), polyp(s), or other lesion(s) by snare                            technique                           45380, 59, Colonoscopy, flexible; with biopsy,                            single or multiple                            99152, Moderate sedation services provided by the                            same physician or other qualified health care                            professional performing the diagnostic or                            therapeutic service that the sedation supports,                            requiring the presence of an independent trained                            observer to assist in the monitoring of the                            patient's  level of consciousness and physiological                            status; initial 15 minutes of intraservice time,                            patient age 53 years or older                           [phone], Moderate sedation services; each additional                            15 minutes intraservice time                           [phone], Moderate sedation services; each additional                            15 minutes intraservice time Diagnosis Code(s):        --- Professional ---                           Z12.11, Encounter for screening for malignant                            neoplasm of colon                           D12.4, Benign neoplasm of descending colon                           D12.3, Benign neoplasm of transverse colon (hepatic                            flexure or splenic flexure)                           D12.2, Benign neoplasm of ascending colon                           K64.4, Residual hemorrhoidal skin tags                           K64.8, Other hemorrhoids CPT copyright 2016 [employer]. All rights reserved. The codes documented in this report are preliminary and upon coder review may  be revised to meet current compliance requirements. Barney Drain, MD Barney Drain, MD 06/16/2016 11:27:34 AM This report has been signed electronically. Number of Addenda: 0

## 2016-06-16 NOTE — Discharge Instructions (Signed)
You had  5 polyps removed. You have small internal and MODERATE EXTERNAL hemorrhoids.   NO MRI FOR 30 DAYS DUE TO METAL CLIP PLACEMENT IN THE COLON.  DRINK WATER TO KEEP YOUR URINE LIGHT YELLOW.  FOLLOW A HIGH FIBER DIET. AVOID ITEMS THAT CAUSE BLOATING & GAS. SEE INFO BELOW.  YOUR BIOPSY RESULTS WILL BE AVAILABLE IN MY CHART AFTER JAN 23 AND MY OFFICE WILL CONTACT YOU IN 10-14 DAYS WITH YOUR RESULTS.   Next colonoscopy in 1-3 years.    Colonoscopy Care After Read the instructions outlined below and refer to this sheet in the next week. These discharge instructions provide you with general information on caring for yourself after you leave the hospital. While your treatment has been planned according to the most current medical practices available, unavoidable complications occasionally occur. If you have any problems or questions after discharge, call DR. Noriel Guthrie, [phone].  ACTIVITY  You may resume your regular activity, but move at a slower pace for the next 24 hours.   Take frequent rest periods for the next 24 hours.   Walking will help get rid of the air and reduce the bloated feeling in your belly (abdomen).   No driving for 24 hours (because of the medicine (anesthesia) used during the test).   You may shower.   Do not sign any important legal documents or operate any machinery for 24 hours (because of the anesthesia used during the test).    NUTRITION  Drink plenty of fluids.   You may resume your normal diet as instructed by your doctor.   Begin with a light meal and progress to your normal diet. Heavy or fried foods are harder to digest and may make you feel sick to your stomach (nauseated).   Avoid alcoholic beverages for 24 hours or as instructed.    MEDICATIONS  You may resume your normal medications.   WHAT YOU CAN EXPECT TODAY  Some feelings of bloating in the abdomen.   Passage of more gas than usual.   Spotting of blood in your stool or on  the toilet paper  .  IF YOU HAD POLYPS REMOVED DURING THE COLONOSCOPY:  Eat a soft diet IF YOU HAVE NAUSEA, BLOATING, ABDOMINAL PAIN, OR VOMITING.    FINDING OUT THE RESULTS OF YOUR TEST Not all test results are available during your visit. DR. Oneida Alar WILL CALL YOU WITHIN 14 DAYS OF YOUR PROCEDUE WITH YOUR RESULTS. Do not assume everything is normal if you have not heard from DR. Ferdinand Revoir, CALL HER OFFICE AT [phone].  SEEK IMMEDIATE MEDICAL ATTENTION AND CALL THE OFFICE: [phone] IF:  You have more than a spotting of blood in your stool.   Your belly is swollen (abdominal distention).   You are nauseated or vomiting.   You have a temperature over 101F.   You have abdominal pain or discomfort that is severe or gets worse throughout the day.   High-Fiber Diet A high-fiber diet changes your normal diet to include more whole grains, legumes, fruits, and vegetables. Changes in the diet involve replacing refined carbohydrates with unrefined foods. The calorie level of the diet is essentially unchanged. The Dietary Reference Intake (recommended amount) for adult males is 38 grams per day. For adult females, it is 25 grams per day. Pregnant and lactating women should consume 28 grams of fiber per day. Fiber is the intact part of a plant that is not broken down during digestion. Functional fiber is fiber that has been isolated from  the plant to provide a beneficial effect in the body. PURPOSE  Increase stool bulk.   Ease and regulate bowel movements.   Lower cholesterol.   REDUCE RISK OF COLON CANCER  INDICATIONS THAT YOU NEED MORE FIBER  Constipation and hemorrhoids.   Uncomplicated diverticulosis (intestine condition) and irritable bowel syndrome.   Weight management.   As a protective measure against hardening of the arteries (atherosclerosis), diabetes, and cancer.   GUIDELINES FOR INCREASING FIBER IN THE DIET  Start adding fiber to the diet slowly. A gradual  increase of about 5 more grams (2 slices of whole-wheat bread, 2 servings of most fruits or vegetables, or 1 bowl of high-fiber cereal) per day is best. Too rapid an increase in fiber may result in constipation, flatulence, and bloating.   Drink enough water and fluids to keep your urine clear or pale yellow. Water, juice, or caffeine-free drinks are recommended. Not drinking enough fluid may cause constipation.   Eat a variety of high-fiber foods rather than one type of fiber.   Try to increase your intake of fiber through using high-fiber foods rather than fiber pills or supplements that contain small amounts of fiber.   The goal is to change the types of food eaten. Do not supplement your present diet with high-fiber foods, but replace foods in your present diet.   INCLUDE A VARIETY OF FIBER SOURCES  Replace refined and processed grains with whole grains, canned fruits with fresh fruits, and incorporate other fiber sources. White rice, white breads, and most bakery goods contain little or no fiber.   Brown whole-grain rice, buckwheat oats, and many fruits and vegetables are all good sources of fiber. These include: broccoli, Brussels sprouts, cabbage, cauliflower, beets, sweet potatoes, white potatoes (skin on), carrots, tomatoes, eggplant, squash, berries, fresh fruits, and dried fruits.   Cereals appear to be the richest source of fiber. Cereal fiber is found in whole grains and bran. Bran is the fiber-rich outer coat of cereal grain, which is largely removed in refining. In whole-grain cereals, the bran remains. In breakfast cereals, the largest amount of fiber is found in those with "bran" in their names. The fiber content is sometimes indicated on the label.   You may need to include additional fruits and vegetables each day.   In baking, for 1 cup white flour, you may use the following substitutions:   1 cup whole-wheat flour minus 2 tablespoons.   1/2 cup white flour plus 1/2 cup  whole-wheat flour.   Polyps, Colon  A polyp is extra tissue that grows inside your body. Colon polyps grow in the large intestine. The large intestine, also called the colon, is part of your digestive system. It is a long, hollow tube at the end of your digestive tract where your body makes and stores stool. Most polyps are not dangerous. They are benign. This means they are not cancerous. But over time, some types of polyps can turn into cancer. Polyps that are smaller than a pea are usually not harmful. But larger polyps could someday become or may already be cancerous. To be safe, doctors remove all polyps and test them.   PREVENTION There is not one sure way to prevent polyps. You might be able to lower your risk of getting them if you:  Eat more fruits and vegetables and less fatty food.   Do not smoke.   Avoid alcohol.   Exercise every day.   Lose weight if you are overweight.   Eating  more calcium and folate can also lower your risk of getting polyps. Some foods that are rich in calcium are milk, cheese, and broccoli. Some foods that are rich in folate are chickpeas, kidney beans, and spinach.   Hemorrhoids Hemorrhoids are dilated (enlarged) veins around the rectum. Sometimes clots will form in the veins. This makes them swollen and painful. These are called thrombosed hemorrhoids. Causes of hemorrhoids include:  Constipation.   Straining to have a bowel movement.   HEAVY LIFTING  HOME CARE INSTRUCTIONS  Eat a well balanced diet and drink 6 to 8 glasses of water every day to avoid constipation. You may also use a bulk laxative.   Avoid straining to have bowel movements.   Keep anal area dry and clean.   Do not use a donut shaped pillow or sit on the toilet for long periods. This increases blood pooling and pain.   Move your bowels when your body has the urge; this will require less straining and will decrease pain and pressure.

## 2016-06-19 ENCOUNTER — Encounter (HOSPITAL_COMMUNITY): Payer: Self-pay | Admitting: Gastroenterology

## 2016-06-23 ENCOUNTER — Other Ambulatory Visit: Payer: Self-pay | Admitting: Family Medicine

## 2016-07-24 ENCOUNTER — Other Ambulatory Visit: Payer: Self-pay | Admitting: Family Medicine

## 2016-09-22 ENCOUNTER — Other Ambulatory Visit: Payer: Self-pay | Admitting: Family Medicine

## 2016-10-25 ENCOUNTER — Telehealth: Payer: Self-pay

## 2016-10-25 NOTE — Telephone Encounter (Signed)
Called pt to reschedule Medicare Annual Wellness Visit due to change in provider schedule. Please schedule for Monday or Wednesday. -nr

## 2016-11-13 ENCOUNTER — Ambulatory Visit (INDEPENDENT_AMBULATORY_CARE_PROVIDER_SITE_OTHER): Payer: Medicare Other

## 2016-11-13 VITALS — BP 110/78 | HR 82 | Temp 98.8°F | Ht 68.0 in | Wt 152.1 lb

## 2016-11-13 DIAGNOSIS — Z0000 Encounter for general adult medical examination without abnormal findings: Secondary | ICD-10-CM | POA: Diagnosis not present

## 2016-11-13 NOTE — Patient Instructions (Signed)
Wanda Ross , Thank you for taking time to come for your Medicare Wellness Visit. I appreciate your ongoing commitment to your health goals. Please review the following plan we discussed and let me know if I can assist you in the future.   Screening recommendations/referrals: Colonoscopy: Up to date, next due 05/2019 Mammogram: Up to date, next due 05/2017 Bone Density: Up to date Recommended yearly ophthalmology/optometry visit for glaucoma screening and checkup Recommended yearly dental visit for hygiene and checkup  Vaccinations: Influenza vaccine: Up to date, next due 01/2017 Pneumococcal vaccine: Up to date Tdap vaccine: Up to date, next due 09/2020 Shingles vaccine: Done    Advanced directives: Advance directive discussed with you today. I have provided a copy for you to complete at home and have notarized. Once this is complete please bring a copy in to our office so we can scan it into your chart.  Next appointment: Follow up with Dr. Moshe Cipro in 6 months for your complete physical. Follow up in 1 year for your annual wellness visit.   Preventive Care 7 Years and Older, Female Preventive care refers to lifestyle choices and visits with your health care provider that can promote health and wellness. What does preventive care include?  A yearly physical exam. This is also called an annual well check.  Dental exams once or twice a year.  Routine eye exams. Ask your health care provider how often you should have your eyes checked.  Personal lifestyle choices, including:  Daily care of your teeth and gums.  Regular physical activity.  Eating a healthy diet.  Avoiding tobacco and drug use.  Limiting alcohol use.  Practicing safe sex.  Taking low-dose aspirin every day.  Taking vitamin and mineral supplements as recommended by your health care provider. What happens during an annual well check? The services and screenings done by your health care provider during your  annual well check will depend on your age, overall health, lifestyle risk factors, and family history of disease. Counseling  Your health care provider may ask you questions about your:  Alcohol use.  Tobacco use.  Drug use.  Emotional well-being.  Home and relationship well-being.  Sexual activity.  Eating habits.  History of falls.  Memory and ability to understand (cognition).  Work and work Statistician.  Reproductive health. Screening  You may have the following tests or measurements:  Height, weight, and BMI.  Blood pressure.  Lipid and cholesterol levels. These may be checked every 5 years, or more frequently if you are over 67 years old.  Skin check.  Lung cancer screening. You may have this screening every year starting at age 57 if you have a 30-pack-year history of smoking and currently smoke or have quit within the past 15 years.  Fecal occult blood test (FOBT) of the stool. You may have this test every year starting at age 52.  Flexible sigmoidoscopy or colonoscopy. You may have a sigmoidoscopy every 5 years or a colonoscopy every 10 years starting at age 36.  Hepatitis C blood test.  Hepatitis B blood test.  Sexually transmitted disease (STD) testing.  Diabetes screening. This is done by checking your blood sugar (glucose) after you have not eaten for a while (fasting). You may have this done every 1-3 years.  Bone density scan. This is done to screen for osteoporosis. You may have this done starting at age 51.  Mammogram. This may be done every 1-2 years. Talk to your health care provider about how often  you should have regular mammograms. Talk with your health care provider about your test results, treatment options, and if necessary, the need for more tests. Vaccines  Your health care provider may recommend certain vaccines, such as:  Influenza vaccine. This is recommended every year.  Tetanus, diphtheria, and acellular pertussis (Tdap, Td)  vaccine. You may need a Td booster every 10 years.  Zoster vaccine. You may need this after age 56.  Pneumococcal 13-valent conjugate (PCV13) vaccine. One dose is recommended after age 8.  Pneumococcal polysaccharide (PPSV23) vaccine. One dose is recommended after age 75. Talk to your health care provider about which screenings and vaccines you need and how often you need them. This information is not intended to replace advice given to you by your health care provider. Make sure you discuss any questions you have with your health care provider. Document Released: 06/11/2015 Document Revised: 02/02/2016 Document Reviewed: 03/16/2015 Elsevier Interactive Patient Education  2017 [place] Prevention in the Home Falls can cause injuries. They can happen to people of all ages. There are many things you can do to make your home safe and to help prevent falls. What can I do on the outside of my home?  Regularly fix the edges of walkways and driveways and fix any cracks.  Remove anything that might make you trip as you walk through a door, such as a raised step or threshold.  Trim any bushes or trees on the path to your home.  Use bright outdoor lighting.  Clear any walking paths of anything that might make someone trip, such as rocks or tools.  Regularly check to see if handrails are loose or broken. Make sure that both sides of any steps have handrails.  Any raised decks and porches should have guardrails on the edges.  Have any leaves, snow, or ice cleared regularly.  Use sand or salt on walking paths during winter.  Clean up any spills in your garage right away. This includes oil or grease spills. What can I do in the bathroom?  Use night lights.  Install grab bars by the toilet and in the tub and shower. Do not use towel bars as grab bars.  Use non-skid mats or decals in the tub or shower.  If you need to sit down in the shower, use a plastic, non-slip  stool.  Keep the floor dry. Clean up any water that spills on the floor as soon as it happens.  Remove soap buildup in the tub or shower regularly.  Attach bath mats securely with double-sided non-slip rug tape.  Do not have throw rugs and other things on the floor that can make you trip. What can I do in the bedroom?  Use night lights.  Make sure that you have a light by your bed that is easy to reach.  Do not use any sheets or blankets that are too big for your bed. They should not hang down onto the floor.  Have a firm chair that has side arms. You can use this for support while you get dressed.  Do not have throw rugs and other things on the floor that can make you trip. What can I do in the kitchen?  Clean up any spills right away.  Avoid walking on wet floors.  Keep items that you use a lot in easy-to-reach places.  If you need to reach something above you, use a strong step stool that has a grab bar.  Keep electrical cords  out of the way.  Do not use floor polish or wax that makes floors slippery. If you must use wax, use non-skid floor wax.  Do not have throw rugs and other things on the floor that can make you trip. What can I do with my stairs?  Do not leave any items on the stairs.  Make sure that there are handrails on both sides of the stairs and use them. Fix handrails that are broken or loose. Make sure that handrails are as long as the stairways.  Check any carpeting to make sure that it is firmly attached to the stairs. Fix any carpet that is loose or worn.  Avoid having throw rugs at the top or bottom of the stairs. If you do have throw rugs, attach them to the floor with carpet tape.  Make sure that you have a light switch at the top of the stairs and the bottom of the stairs. If you do not have them, ask someone to add them for you. What else can I do to help prevent falls?  Wear shoes that:  Do not have high heels.  Have rubber bottoms.  Are  comfortable and fit you well.  Are closed at the toe. Do not wear sandals.  If you use a stepladder:  Make sure that it is fully opened. Do not climb a closed stepladder.  Make sure that both sides of the stepladder are locked into place.  Ask someone to hold it for you, if possible.  Clearly mark and make sure that you can see:  Any grab bars or handrails.  First and last steps.  Where the edge of each step is.  Use tools that help you move around (mobility aids) if they are needed. These include:  Canes.  Walkers.  Scooters.  Crutches.  Turn on the lights when you go into a dark area. Replace any light bulbs as soon as they burn out.  Set up your furniture so you have a clear path. Avoid moving your furniture around.  If any of your floors are uneven, fix them.  If there are any pets around you, be aware of where they are.  Review your medicines with your doctor. Some medicines can make you feel dizzy. This can increase your chance of falling. Ask your doctor what other things that you can do to help prevent falls. This information is not intended to replace advice given to you by your health care provider. Make sure you discuss any questions you have with your health care provider. Document Released: 03/11/2009 Document Revised: 10/21/2015 Document Reviewed: 06/19/2014 Elsevier Interactive Patient Education  2017 [employer].

## 2016-11-13 NOTE — Progress Notes (Signed)
Subjective:   Wanda Ross is a 66 y.o. female who presents for Medicare Annual (Subsequent) preventive examination.  Review of Systems:  Cardiac Risk Factors include: dyslipidemia;hypertension;advanced age (>80men, >24 women)     Objective:     Vitals: BP 110/78   Pulse 82   Temp 98.8 F (37.1 C) (Oral)   Ht 5\' 8"  (1.727 m)   Wt 152 lb 1.9 oz (69 kg)   BMI 23.13 kg/m   Body mass index is 23.13 kg/m.   Tobacco History  Smoking Status  . Never Smoker  Smokeless Tobacco  . Never Used     Counseling given: Not Answered   Past Medical History:  Diagnosis Date  . Glaucoma   . Hyperlipidemia 2010  . Hypertension 1990  . IGT (impaired glucose tolerance) 05/11/2010  . Multinodular goiter 2010   normal function   Past Surgical History:  Procedure Laterality Date  . COLONOSCOPY N/A 06/16/2016   Procedure: COLONOSCOPY;  Surgeon: Danie Binder, MD;  Location: [facility];  Service: Endoscopy;  Laterality: N/A;  9:30 AM  . EYE SURGERY  2017   3 on left eye 1 on right eye for glaucoma by Dr. Venetia Maxon   . EYE SURGERY  2018   Cataract Surgery by Dr. Arlina Robes    Family History  Problem Relation Age of Onset  . Dementia Father   . Dementia Mother   . Diabetes Mother   . Hypertension Mother   . Breast cancer Sister   . Hypertension Sister   . Diabetes Brother    History  Sexual Activity  . Sexual activity: No    Outpatient Encounter Prescriptions as of 11/13/2016  Medication Sig  . Aspirin (ADULT ASPIRIN LOW STRENGTH) 81 MG EC tablet Take 81 mg by mouth daily.    . brimonidine-timolol (COMBIGAN) 0.2-0.5 % ophthalmic solution Place 1 drop into the left eye 2 (two) times daily.  . Calcium Carbonate-Vit D-Min (CALCIUM 1200) 1200-1000 MG-UNIT CHEW Chew 1,200 mg by mouth daily.    . cloNIDine (CATAPRES) 0.2 MG tablet Take 0.2 mg by mouth at bedtime.  . gabapentin (NEURONTIN) 100 MG capsule TAKE 1 CAPSULE (100 MG TOTAL) BY MOUTH AT BEDTIME. (Patient taking  differently: TAKE 1 CAPSULE (100 MG TOTAL) BY MOUTH AT BEDTIME AS NEEDED)  . Multiple Vitamins-Minerals (CENTRUM SILVER ULTRA WOMENS) TABS Take 1 tablet by mouth daily.   . pravastatin (PRAVACHOL) 10 MG tablet TAKE 1 TABLET BY MOUTH DAILY *DOSE REDUCTION*  . trimethoprim-polymyxin b (POLYTRIM) ophthalmic solution Place 1 drop into the left eye every 6 (six) hours.  . valsartan-hydrochlorothiazide (DIOVAN-HCT) 320-25 MG tablet TAKE 1 TABLET BY MOUTH EVERY DAY   No facility-administered encounter medications on file as of 11/13/2016.     Activities of Daily Living In your present state of health, do you have any difficulty performing the following activities: 11/13/2016 11/16/2015  Hearing? N N  Vision? N N  Difficulty concentrating or making decisions? N N  Walking or climbing stairs? N N  Dressing or bathing? N N  Doing errands, shopping? - Scientist, forensic and eating ? N -  Using the Toilet? N -  In the past six months, have you accidently leaked urine? N -  Do you have problems with loss of bowel control? N -  Managing your Medications? N -  Managing your Finances? N -  Housekeeping or managing your Housekeeping? N -  Some recent data might be hidden    Patient Care  Team: Fayrene Helper, MD as PCP - General Leta Baptist, MD as Consulting Physician (Otolaryngology) Florian Buff, MD as Consulting Physician (Obstetrics and Gynecology) Despina Hick, MD as Consulting Physician (Ophthalmology)    Assessment:    Exercise Activities and Dietary recommendations Current Exercise Habits: Structured exercise class, Type of exercise: walking;treadmill, Time (Minutes): 60, Frequency (Times/Week): 5, Weekly Exercise (Minutes/Week): 300, Intensity: Moderate, Exercise limited by: None identified  Goals    . Maintain current exercise program      Fall Risk Fall Risk  11/13/2016 11/16/2015 05/10/2015 10/08/2014 08/19/2013  Falls in the past year? No No No No No   Depression Screen PHQ 2/9  Scores 11/13/2016 11/16/2015 05/10/2015 03/19/2013  PHQ - 2 Score 0 0 0 0  PHQ- 9 Score - - 0 -     Cognitive Function: Normal   6CIT Screen 11/13/2016  What Year? 0 points  What month? 0 points  What time? 0 points  Count back from 20 0 points  Months in reverse 0 points  Repeat phrase 0 points  Total Score 0    Immunization History  Administered Date(s) Administered  . Influenza Split 02/15/2011, 02/08/2012  . Influenza Whole 05/10/2009, 03/16/2010  . Influenza,inj,Quad PF,36+ Mos 03/19/2013, 02/04/2014, 01/26/2015, 02/10/2016  . Pneumococcal Conjugate-13 12/26/2013  . Pneumococcal Polysaccharide-23 08/19/2013  . Tdap 09/27/2010  . Zoster 12/07/2008   Screening Tests Health Maintenance  Topic Date Due  . INFLUENZA VACCINE  12/27/2016  . MAMMOGRAM  06/09/2018  . TETANUS/TDAP  09/26/2020  . COLONOSCOPY  06/16/2026  . DEXA SCAN  Completed  . Hepatitis C Screening  Completed  . PNA vac Low Risk Adult  Completed      Plan:   I have personally reviewed and noted the following in the patient's chart:   . Medical and social history . Use of alcohol, tobacco or illicit drugs  . Current medications and supplements . Functional ability and status . Nutritional status . Physical activity . Advanced directives . List of other physicians . Hospitalizations, surgeries, and ER visits in previous 12 months . Vitals . Screenings to include cognitive, depression, and falls . Referrals and appointments  In addition, I have reviewed and discussed with patient certain preventive protocols, quality metrics, and best practice recommendations. A written personalized care plan for preventive services as well as general preventive health recommendations were provided to patient.     Stormy Fabian, LPN  0/25/4270

## 2016-11-16 ENCOUNTER — Ambulatory Visit: Payer: Medicare Other

## 2016-11-20 ENCOUNTER — Other Ambulatory Visit (INDEPENDENT_AMBULATORY_CARE_PROVIDER_SITE_OTHER): Payer: Self-pay | Admitting: Otolaryngology

## 2016-11-20 DIAGNOSIS — E041 Nontoxic single thyroid nodule: Secondary | ICD-10-CM

## 2016-11-24 ENCOUNTER — Ambulatory Visit (HOSPITAL_COMMUNITY)
Admission: RE | Admit: 2016-11-24 | Discharge: 2016-11-24 | Disposition: A | Discharge status: Home / Self Care | Payer: Medicare Other | Source: Ambulatory Visit | Attending: Otolaryngology | Admitting: Otolaryngology

## 2016-11-24 DIAGNOSIS — E042 Nontoxic multinodular goiter: Secondary | ICD-10-CM | POA: Diagnosis not present

## 2016-11-24 DIAGNOSIS — E041 Nontoxic single thyroid nodule: Secondary | ICD-10-CM | POA: Diagnosis present

## 2016-11-30 ENCOUNTER — Ambulatory Visit (INDEPENDENT_AMBULATORY_CARE_PROVIDER_SITE_OTHER): Payer: Medicare Other | Admitting: Otolaryngology

## 2016-11-30 DIAGNOSIS — D440 Neoplasm of uncertain behavior of thyroid gland: Secondary | ICD-10-CM

## 2016-12-29 ENCOUNTER — Other Ambulatory Visit: Payer: Self-pay | Admitting: Family Medicine

## 2017-02-13 ENCOUNTER — Ambulatory Visit (INDEPENDENT_AMBULATORY_CARE_PROVIDER_SITE_OTHER): Payer: Medicare Other

## 2017-02-13 DIAGNOSIS — Z23 Encounter for immunization: Secondary | ICD-10-CM | POA: Diagnosis not present

## 2017-05-09 ENCOUNTER — Telehealth: Payer: Self-pay

## 2017-05-09 DIAGNOSIS — E739 Lactose intolerance, unspecified: Secondary | ICD-10-CM

## 2017-05-09 DIAGNOSIS — E7849 Other hyperlipidemia: Secondary | ICD-10-CM

## 2017-05-09 DIAGNOSIS — E559 Vitamin D deficiency, unspecified: Secondary | ICD-10-CM

## 2017-05-09 DIAGNOSIS — I10 Essential (primary) hypertension: Secondary | ICD-10-CM

## 2017-05-09 NOTE — Telephone Encounter (Signed)
Labs ordered.

## 2017-05-12 LAB — CBC
HCT: 41.6 % (ref 35.0–45.0)
Hemoglobin: 14.3 g/dL (ref 11.7–15.5)
MCH: 29.4 pg (ref 27.0–33.0)
MCHC: 34.4 g/dL (ref 32.0–36.0)
MCV: 85.4 fL (ref 80.0–100.0)
MPV: 9.2 fL (ref 7.5–12.5)
Platelets: 361 10*3/uL (ref 140–400)
RBC: 4.87 10*6/uL (ref 3.80–5.10)
RDW: 13.2 % (ref 11.0–15.0)
WBC: 4.2 10*3/uL (ref 3.8–10.8)

## 2017-05-12 LAB — COMPLETE METABOLIC PANEL WITH GFR
AG Ratio: 1.8 (calc) (ref 1.0–2.5)
ALT: 20 U/L (ref 6–29)
AST: 28 U/L (ref 10–35)
Albumin: 4.6 g/dL (ref 3.6–5.1)
Alkaline phosphatase (APISO): 63 U/L (ref 33–130)
BUN: 21 mg/dL (ref 7–25)
CO2: 32 mmol/L (ref 20–32)
Calcium: 9.8 mg/dL (ref 8.6–10.4)
Chloride: 100 mmol/L (ref 98–110)
Creat: 0.72 mg/dL (ref 0.50–0.99)
GFR, Est African American: 100 mL/min/{1.73_m2} (ref >=60)
GFR, Est Non African American: 86 mL/min/{1.73_m2} (ref >=60)
Globulin: 2.6 g/dL (calc) (ref 1.9–3.7)
Glucose, Bld: 89 mg/dL (ref 65–99)
Potassium: 3.6 mmol/L (ref 3.5–5.3)
Sodium: 140 mmol/L (ref 135–146)
Total Bilirubin: 0.7 mg/dL (ref 0.2–1.2)
Total Protein: 7.2 g/dL (ref 6.1–8.1)

## 2017-05-12 LAB — LIPID PANEL
Cholesterol: 159 mg/dL (ref <200)
HDL: 50 mg/dL — ABNORMAL LOW (ref >50)
LDL Cholesterol (Calc): 90 mg/dL (calc)
Non-HDL Cholesterol (Calc): 109 mg/dL (calc) (ref <130)
Total CHOL/HDL Ratio: 3.2 (calc) (ref <5.0)
Triglycerides: 91 mg/dL (ref <150)

## 2017-05-12 LAB — VITAMIN D 25 HYDROXY (VIT D DEFICIENCY, FRACTURES): Vit D, 25-Hydroxy: 27 ng/mL — ABNORMAL LOW (ref 30–100)

## 2017-05-12 LAB — TSH: TSH: 2.06 mIU/L (ref 0.40–4.50)

## 2017-05-16 ENCOUNTER — Other Ambulatory Visit: Payer: Self-pay | Admitting: Family Medicine

## 2017-05-16 ENCOUNTER — Ambulatory Visit (INDEPENDENT_AMBULATORY_CARE_PROVIDER_SITE_OTHER): Payer: Medicare Other | Admitting: Family Medicine

## 2017-05-16 ENCOUNTER — Encounter: Payer: Self-pay | Admitting: Family Medicine

## 2017-05-16 VITALS — BP 140/80 | HR 80 | Resp 16 | Ht 68.0 in | Wt 154.0 lb

## 2017-05-16 DIAGNOSIS — Z1231 Encounter for screening mammogram for malignant neoplasm of breast: Secondary | ICD-10-CM

## 2017-05-16 DIAGNOSIS — I10 Essential (primary) hypertension: Secondary | ICD-10-CM | POA: Diagnosis not present

## 2017-05-16 DIAGNOSIS — Z0000 Encounter for general adult medical examination without abnormal findings: Secondary | ICD-10-CM

## 2017-05-16 MED ORDER — LOSARTAN POTASSIUM-HCTZ 100-25 MG PO TABS: 1.0000 | ORAL_TABLET | Freq: Every day | ORAL | 3 refills | Status: DC

## 2017-05-16 NOTE — Patient Instructions (Addendum)
Nurse BP check in 6 weeks  Mammogram to be scheduled at checkout, due Jan 13 or after , please   Wellness with nurse June 19 or after, call if you need me sooner    Continue great health habits  Stop diovam/hCTZ and starting tomorrow your new BP tablet in it's place is Hyzaar 100/25 one daily, continue clonidine at bedtime as before   Start vitamin D3 2000 IU once daily, this is over the counter, please   Thank you  for choosing [facility]. We consider it a privelige to serve you.  Delivering excellent health care in a caring and  compassionate way is our goal.  Partnering with you,  so that together we can achieve this goal is our strategy.

## 2017-05-20 ENCOUNTER — Encounter: Payer: Self-pay | Admitting: Family Medicine

## 2017-05-20 DIAGNOSIS — Z0000 Encounter for general adult medical examination without abnormal findings: Secondary | ICD-10-CM | POA: Insufficient documentation

## 2017-05-20 NOTE — Progress Notes (Addendum)
    Wanda Ross     MRN: [id number]      DOB: [birth year]  HPI: Patient is in for annual physical exam. Blood pressure medication is changed because of recent drug recall Recent labs,  are reviewed. Immunization is reviewed , and  Is up to date PE: BP 140/80   Pulse 80   Resp 16   Ht 5\' 8"  (1.727 m)   Wt 154 lb (69.9 kg)   SpO2 95%   BMI 23.42 kg/m  Pleasant  female, alert and oriented x 3, in no cardio-pulmonary distress. Afebrile. HEENT No facial trauma or asymetry. Sinuses non tender.  Extra occullar muscles intact,  External ears normal, tympanic membranes clear. Oropharynx moist, no exudate. Neck: supple, no adenopathy,JVD or thyromegaly.No bruits.  Chest: Clear to ascultation bilaterally.No crackles or wheezes. Non tender to palpation  Breast: No asymetry,no masses or lumps. No tenderness. No nipple discharge or inversion. No axillary or supraclavicular adenopathy  Cardiovascular system; Heart sounds normal,  S1 and  S2 ,no S3.  No murmur, or thrill. Apical beat not displaced Peripheral pulses normal.  Abdomen: Soft, non tender, no organomegaly or masses. No bruits. Bowel sounds normal. No guarding, tenderness or rebound.  Rectal:  Not examined, had colonoscopy in 2018GU: Not examined   Musculoskeletal exam: Full ROM of spine, hips , shoulders and knees. No deformity ,swelling or crepitus noted. No muscle wasting or atrophy.   Neurologic: Cranial nerves 2 to 12 intact. Power, tone ,sensation and reflexes normal throughout. No disturbance in gait. No tremor.  Skin: Intact, no ulceration, erythema , scaling or rash noted. Pigmentation normal throughout  Psych; Normal mood and affect. Judgement and concentration normal   Assessment & Plan:  Annual physical exam Annual exam as documented. Counseling done  re healthy lifestyle involving commitment to 150 minutes exercise per week, heart healthy diet, and attaining healthy weight.The  importance of adequate sleep also discussed. Regular seat belt use and home safety, is also discussed. Changes in health habits are decided on by the patient with goals and time frames  set for achieving them. Immunization and cancer screening needs are specifically addressed at this visit.   Essential hypertension Sub optimal control, need to change medication because of withdrawal of drug from the [facility]  Start hyzaar , nurse bP check in  Weeks DASH diet and commitment to daily physical activity for a minimum of 30 minutes discussed and encouraged, as a part of hypertension management. The importance of attaining a healthy weight is also discussed.  BP/Weight 05/16/2017 11/13/2016 06/16/2016 05/10/2016 11/16/2015 07/26/2015 53/66/4403  Systolic BP 474 259 563 875 643 329 518  Diastolic BP 80 78 74 82 74 80 82  Wt. (Lbs) 154 152.12 147 148 151 146 146  BMI 23.42 23.13 22.35 22.5 22.29 22.2 21.55

## 2017-05-20 NOTE — Assessment & Plan Note (Signed)

## 2017-05-20 NOTE — Assessment & Plan Note (Addendum)
Sub optimal control, need to change medication because of withdrawal of drug from the [facility]  Start hyzaar , nurse bP check in  Weeks DASH diet and commitment to daily physical activity for a minimum of 30 minutes discussed and encouraged, as a part of hypertension management. The importance of attaining a healthy weight is also discussed.  BP/Weight 05/16/2017 11/13/2016 06/16/2016 05/10/2016 11/16/2015 07/26/2015 31/51/7616  Systolic BP 073 710 626 948 546 270 350  Diastolic BP 80 78 74 82 74 80 82  Wt. (Lbs) 154 152.12 147 148 151 146 146  BMI 23.42 23.13 22.35 22.5 22.29 22.2 21.55

## 2017-06-11 ENCOUNTER — Ambulatory Visit (HOSPITAL_COMMUNITY): Payer: Medicare Other

## 2017-06-15 ENCOUNTER — Encounter (HOSPITAL_COMMUNITY): Payer: Self-pay

## 2017-06-15 ENCOUNTER — Ambulatory Visit (HOSPITAL_COMMUNITY)
Admission: RE | Admit: 2017-06-15 | Discharge: 2017-06-15 | Disposition: A | Discharge status: Home / Self Care | Payer: Medicare Other | Source: Ambulatory Visit | Attending: Family Medicine | Admitting: Family Medicine

## 2017-06-15 DIAGNOSIS — N6489 Other specified disorders of breast: Secondary | ICD-10-CM | POA: Insufficient documentation

## 2017-06-15 DIAGNOSIS — R928 Other abnormal and inconclusive findings on diagnostic imaging of breast: Secondary | ICD-10-CM | POA: Insufficient documentation

## 2017-06-15 DIAGNOSIS — Z1231 Encounter for screening mammogram for malignant neoplasm of breast: Secondary | ICD-10-CM | POA: Diagnosis not present

## 2017-06-18 ENCOUNTER — Other Ambulatory Visit: Payer: Self-pay | Admitting: Family Medicine

## 2017-06-18 DIAGNOSIS — R928 Other abnormal and inconclusive findings on diagnostic imaging of breast: Secondary | ICD-10-CM

## 2017-06-19 ENCOUNTER — Ambulatory Visit (HOSPITAL_COMMUNITY)
Admission: RE | Admit: 2017-06-19 | Discharge: 2017-06-19 | Disposition: A | Discharge status: Home / Self Care | Payer: Medicare Other | Source: Ambulatory Visit | Attending: Family Medicine | Admitting: Family Medicine

## 2017-06-19 ENCOUNTER — Encounter: Payer: Self-pay | Admitting: Family Medicine

## 2017-06-19 DIAGNOSIS — R928 Other abnormal and inconclusive findings on diagnostic imaging of breast: Secondary | ICD-10-CM | POA: Insufficient documentation

## 2017-06-22 ENCOUNTER — Other Ambulatory Visit: Payer: Self-pay

## 2017-06-22 MED ORDER — CLONIDINE HCL 0.2 MG PO TABS: 0.2000 mg | ORAL_TABLET | Freq: Every day | ORAL | 1 refills | Status: DC

## 2017-07-02 ENCOUNTER — Other Ambulatory Visit: Payer: Self-pay

## 2017-07-02 ENCOUNTER — Other Ambulatory Visit: Payer: Self-pay | Admitting: Family Medicine

## 2017-07-02 ENCOUNTER — Telehealth: Payer: Self-pay | Admitting: Family Medicine

## 2017-07-02 ENCOUNTER — Ambulatory Visit: Payer: Medicare Other

## 2017-07-02 VITALS — BP 158/84

## 2017-07-02 DIAGNOSIS — I10 Essential (primary) hypertension: Secondary | ICD-10-CM

## 2017-07-02 MED ORDER — CLONIDINE HCL 0.3 MG PO TABS: 0.3000 mg | ORAL_TABLET | Freq: Two times a day (BID) | ORAL | 3 refills | Status: DC

## 2017-07-02 MED ORDER — CLONIDINE HCL 0.3 MG PO TABS: ORAL_TABLET | ORAL | 3 refills | Status: DC

## 2017-07-02 NOTE — Telephone Encounter (Signed)
Patient aware and med sent to [facility]

## 2017-07-02 NOTE — Telephone Encounter (Signed)
Blood pressure today documented at  152/82 by nurse blood pressure check.  Her dose of clonidine needs to be increased to 0.3 mg 1 at bedtime. She needs a return MD appointment scheduled at end of April for BP re evaluation, I have sent in the new dose of clonidine also , please let her know

## 2017-08-13 ENCOUNTER — Telehealth: Payer: Self-pay | Admitting: Family Medicine

## 2017-08-13 NOTE — Telephone Encounter (Signed)
Brooke from [facility] in [place] left a voicemail for a nurse to call her back for a verbal change due to lasartin being on back order. [phone]

## 2017-08-15 ENCOUNTER — Other Ambulatory Visit: Payer: Self-pay | Admitting: Family Medicine

## 2017-08-15 MED ORDER — TRIAMTERENE-HCTZ 37.5-25 MG PO TABS: 1.0000 | ORAL_TABLET | Freq: Every day | ORAL | 3 refills | Status: DC

## 2017-08-15 NOTE — Telephone Encounter (Signed)
Pt stopped by as she had not heard anything --the dosage she is on  Is on backorder, so they need a change

## 2017-08-15 NOTE — Telephone Encounter (Signed)
Please advise 

## 2017-08-15 NOTE — Telephone Encounter (Signed)
Pls call and tell pt, and notify pharmacist also Changed to triamterene stop losartan/hctz starting 08/16/2017 This has been sent in  Pt needs to be given appt to see me in end May / early June to reassess BP on new med ALSO please

## 2017-08-16 NOTE — Telephone Encounter (Signed)
Patient informed of message below, verbalized understanding. Will call back to schedule.

## 2017-09-27 ENCOUNTER — Other Ambulatory Visit: Payer: Self-pay | Admitting: Family Medicine

## 2017-10-25 ENCOUNTER — Other Ambulatory Visit: Payer: Self-pay | Admitting: Family Medicine

## 2017-11-08 ENCOUNTER — Other Ambulatory Visit: Payer: Self-pay | Admitting: Family Medicine

## 2017-11-19 ENCOUNTER — Ambulatory Visit: Payer: Medicare Other

## 2017-11-28 ENCOUNTER — Other Ambulatory Visit: Payer: Self-pay | Admitting: Family Medicine

## 2017-12-17 ENCOUNTER — Ambulatory Visit: Payer: Medicare Other | Admitting: Family Medicine

## 2017-12-17 ENCOUNTER — Ambulatory Visit (INDEPENDENT_AMBULATORY_CARE_PROVIDER_SITE_OTHER): Payer: Medicare Other | Admitting: *Deleted

## 2017-12-17 ENCOUNTER — Other Ambulatory Visit: Payer: Self-pay

## 2017-12-17 ENCOUNTER — Encounter: Payer: Self-pay | Admitting: Family Medicine

## 2017-12-17 ENCOUNTER — Telehealth: Payer: Self-pay | Admitting: Family Medicine

## 2017-12-17 VITALS — BP 190/90 | HR 100 | Ht 68.0 in | Wt 153.0 lb

## 2017-12-17 VITALS — BP 180/100 | HR 105 | Temp 98.6°F | Resp 16 | Ht 68.0 in | Wt 152.1 lb

## 2017-12-17 DIAGNOSIS — I10 Essential (primary) hypertension: Secondary | ICD-10-CM

## 2017-12-17 DIAGNOSIS — Z0000 Encounter for general adult medical examination without abnormal findings: Secondary | ICD-10-CM | POA: Diagnosis not present

## 2017-12-17 MED ORDER — AMLODIPINE BESYLATE 5 MG PO TABS: 5.0000 mg | ORAL_TABLET | Freq: Every day | ORAL | 1 refills | Status: DC

## 2017-12-17 MED ORDER — TRIAMTERENE-HCTZ 75-50 MG PO TABS
1.0000 | ORAL_TABLET | Freq: Every day | ORAL | 1 refills | Status: DC
Start: 2017-12-17 — End: 2018-02-07

## 2017-12-17 NOTE — Patient Instructions (Addendum)
F/U in 2 weeks with an EKG to be done at the visit  Blood pressure is too high increase triamterene dose to 75/50  Daily New I amlodipinne 5 mg one daily Continue clonidine 1 at bedtime  Fas ting chem 7 and EGFR 2 days before f/u appointment please  Ensure you drink 64 oz water daily and keep salt intake down  DO NOT WORRY!!!  Bring cuff  To next visit   Hypertension Hypertension is another name for high blood pressure. High blood pressure forces your heart to work harder to pump blood. This can cause problems over time. There are two numbers in a blood pressure reading. There is a top number (systolic) over a bottom number (diastolic). It is best to have a blood pressure below 120/80. Healthy choices can help lower your blood pressure. You may need medicine to help lower your blood pressure if:  Your blood pressure cannot be lowered with healthy choices.  Your blood pressure is higher than 130/80.  Follow these instructions at home: Eating and drinking  If directed, follow the DASH eating plan. This diet includes: ? Filling half of your plate at each meal with fruits and vegetables. ? Filling one quarter of your plate at each meal with whole grains. Whole grains include whole wheat pasta, brown rice, and whole grain bread. ? Eating or drinking low-fat dairy products, such as skim milk or low-fat yogurt. ? Filling one quarter of your plate at each meal with low-fat (lean) proteins. Low-fat proteins include fish, skinless chicken, eggs, beans, and tofu. ? Avoiding fatty meat, cured and processed meat, or chicken with skin. ? Avoiding premade or processed food.  Eat less than 1,500 mg of salt (sodium) a day.  Limit alcohol use to no more than 1 drink a day for nonpregnant women and 2 drinks a day for men. One drink equals 12 oz of beer, 5 oz of wine, or 1 oz of hard liquor. Lifestyle  Work with your doctor to stay at a healthy weight or to lose weight. Ask your doctor what the  best weight is for you.  Get at least 30 minutes of exercise that causes your heart to beat faster (aerobic exercise) most days of the week. This may include walking, swimming, or biking.  Get at least 30 minutes of exercise that strengthens your muscles (resistance exercise) at least 3 days a week. This may include lifting weights or pilates.  Do not use any products that contain nicotine or tobacco. This includes cigarettes and e-cigarettes. If you need help quitting, ask your doctor.  Check your blood pressure at home as told by your doctor.  Keep all follow-up visits as told by your doctor. This is important. Medicines  Take over-the-counter and prescription medicines only as told by your doctor. Follow directions carefully.  Do not skip doses of blood pressure medicine. The medicine does not work as well if you skip doses. Skipping doses also puts you at risk for problems.  Ask your doctor about side effects or reactions to medicines that you should watch for. Contact a doctor if:  You think you are having a reaction to the medicine you are taking.  You have headaches that keep coming back (recurring).  You feel dizzy.  You have swelling in your ankles.  You have trouble with your vision. Get help right away if:  You get a very bad headache.  You start to feel confused.  You feel weak or numb.  You feel  faint.  You get very bad pain in your: ? Chest. ? Belly (abdomen).  You throw up (vomit) more than once.  You have trouble breathing. Summary  Hypertension is another name for high blood pressure.  Making healthy choices can help lower blood pressure. If your blood pressure cannot be controlled with healthy choices, you may need to take medicine. This information is not intended to replace advice given to you by your health care provider. Make sure you discuss any questions you have with your health care provider. Document Released: 11/01/2007 Document Revised:  04/12/2016 Document Reviewed: 04/12/2016 Elsevier Interactive Patient Education  [employer].

## 2017-12-17 NOTE — Patient Instructions (Signed)
Ms. Wanda Ross , Thank you for taking time to come for your Medicare Wellness Visit. I appreciate your ongoing commitment to your health goals. Please review the following plan we discussed and let me know if I can assist you in the future.   Screening recommendations/referrals: Colonoscopy: up to date Mammogram: up to date Bone Density: up to date  Recommended yearly ophthalmology/optometry visit for glaucoma screening and checkup Recommended yearly dental visit for hygiene and checkup  Vaccinations: Influenza vaccine: due in the fall  Pneumococcal vaccine: done  Tdap vaccine: done  Shingles vaccine: ask insurance   Advanced directives: info given   Conditions/risks identified: complete   Next appointment: scheduled    Preventive Care 28 Years and Older, Female Preventive care refers to lifestyle choices and visits with your health care provider that can promote health and wellness. What does preventive care include?  A yearly physical exam. This is also called an annual well check.  Dental exams once or twice a year.  Routine eye exams. Ask your health care provider how often you should have your eyes checked.  Personal lifestyle choices, including:  Daily care of your teeth and gums.  Regular physical activity.  Eating a healthy diet.  Avoiding tobacco and drug use.  Limiting alcohol use.  Practicing safe sex.  Taking low-dose aspirin every day.  Taking vitamin and mineral supplements as recommended by your health care provider. What happens during an annual well check? The services and screenings done by your health care provider during your annual well check will depend on your age, overall health, lifestyle risk factors, and family history of disease. Counseling  Your health care provider may ask you questions about your:  Alcohol use.  Tobacco use.  Drug use.  Emotional well-being.  Home and relationship well-being.  Sexual activity.  Eating  habits.  History of falls.  Memory and ability to understand (cognition).  Work and work Statistician.  Reproductive health. Screening  You may have the following tests or measurements:  Height, weight, and BMI.  Blood pressure.  Lipid and cholesterol levels. These may be checked every 5 years, or more frequently if you are over 5 years old.  Skin check.  Lung cancer screening. You may have this screening every year starting at age 71 if you have a 30-pack-year history of smoking and currently smoke or have quit within the past 15 years.  Fecal occult blood test (FOBT) of the stool. You may have this test every year starting at age 70.  Flexible sigmoidoscopy or colonoscopy. You may have a sigmoidoscopy every 5 years or a colonoscopy every 10 years starting at age 17.  Hepatitis C blood test.  Hepatitis B blood test.  Sexually transmitted disease (STD) testing.  Diabetes screening. This is done by checking your blood sugar (glucose) after you have not eaten for a while (fasting). You may have this done every 1-3 years.  Bone density scan. This is done to screen for osteoporosis. You may have this done starting at age 64.  Mammogram. This may be done every 1-2 years. Talk to your health care provider about how often you should have regular mammograms. Talk with your health care provider about your test results, treatment options, and if necessary, the need for more tests. Vaccines  Your health care provider may recommend certain vaccines, such as:  Influenza vaccine. This is recommended every year.  Tetanus, diphtheria, and acellular pertussis (Tdap, Td) vaccine. You may need a Td booster every 10 years.  Zoster vaccine. You may need this after age 31.  Pneumococcal 13-valent conjugate (PCV13) vaccine. One dose is recommended after age 56.  Pneumococcal polysaccharide (PPSV23) vaccine. One dose is recommended after age 78. Talk to your health care provider about which  screenings and vaccines you need and how often you need them. This information is not intended to replace advice given to you by your health care provider. Make sure you discuss any questions you have with your health care provider. Document Released: 06/11/2015 Document Revised: 02/02/2016 Document Reviewed: 03/16/2015 Elsevier Interactive Patient Education  2017 [place] Prevention in the Home Falls can cause injuries. They can happen to people of all ages. There are many things you can do to make your home safe and to help prevent falls. What can I do on the outside of my home?  Regularly fix the edges of walkways and driveways and fix any cracks.  Remove anything that might make you trip as you walk through a door, such as a raised step or threshold.  Trim any bushes or trees on the path to your home.  Use bright outdoor lighting.  Clear any walking paths of anything that might make someone trip, such as rocks or tools.  Regularly check to see if handrails are loose or broken. Make sure that both sides of any steps have handrails.  Any raised decks and porches should have guardrails on the edges.  Have any leaves, snow, or ice cleared regularly.  Use sand or salt on walking paths during winter.  Clean up any spills in your garage right away. This includes oil or grease spills. What can I do in the bathroom?  Use night lights.  Install grab bars by the toilet and in the tub and shower. Do not use towel bars as grab bars.  Use non-skid mats or decals in the tub or shower.  If you need to sit down in the shower, use a plastic, non-slip stool.  Keep the floor dry. Clean up any water that spills on the floor as soon as it happens.  Remove soap buildup in the tub or shower regularly.  Attach bath mats securely with double-sided non-slip rug tape.  Do not have throw rugs and other things on the floor that can make you trip. What can I do in the bedroom?  Use  night lights.  Make sure that you have a light by your bed that is easy to reach.  Do not use any sheets or blankets that are too big for your bed. They should not hang down onto the floor.  Have a firm chair that has side arms. You can use this for support while you get dressed.  Do not have throw rugs and other things on the floor that can make you trip. What can I do in the kitchen?  Clean up any spills right away.  Avoid walking on wet floors.  Keep items that you use a lot in easy-to-reach places.  If you need to reach something above you, use a strong step stool that has a grab bar.  Keep electrical cords out of the way.  Do not use floor polish or wax that makes floors slippery. If you must use wax, use non-skid floor wax.  Do not have throw rugs and other things on the floor that can make you trip. What can I do with my stairs?  Do not leave any items on the stairs.  Make sure that there are  handrails on both sides of the stairs and use them. Fix handrails that are broken or loose. Make sure that handrails are as long as the stairways.  Check any carpeting to make sure that it is firmly attached to the stairs. Fix any carpet that is loose or worn.  Avoid having throw rugs at the top or bottom of the stairs. If you do have throw rugs, attach them to the floor with carpet tape.  Make sure that you have a light switch at the top of the stairs and the bottom of the stairs. If you do not have them, ask someone to add them for you. What else can I do to help prevent falls?  Wear shoes that:  Do not have high heels.  Have rubber bottoms.  Are comfortable and fit you well.  Are closed at the toe. Do not wear sandals.  If you use a stepladder:  Make sure that it is fully opened. Do not climb a closed stepladder.  Make sure that both sides of the stepladder are locked into place.  Ask someone to hold it for you, if possible.  Clearly mark and make sure that you  can see:  Any grab bars or handrails.  First and last steps.  Where the edge of each step is.  Use tools that help you move around (mobility aids) if they are needed. These include:  Canes.  Walkers.  Scooters.  Crutches.  Turn on the lights when you go into a dark area. Replace any light bulbs as soon as they burn out.  Set up your furniture so you have a clear path. Avoid moving your furniture around.  If any of your floors are uneven, fix them.  If there are any pets around you, be aware of where they are.  Review your medicines with your doctor. Some medicines can make you feel dizzy. This can increase your chance of falling. Ask your doctor what other things that you can do to help prevent falls. This information is not intended to replace advice given to you by your health care provider. Make sure you discuss any questions you have with your health care provider. Document Released: 03/11/2009 Document Revised: 10/21/2015 Document Reviewed: 06/19/2014 Elsevier Interactive Patient Education  2017 [employer].

## 2017-12-17 NOTE — Progress Notes (Signed)
Subjective:   Wanda Ross is a 67 y.o. female who presents for Medicare Annual (Subsequent) preventive examination.  Review of Systems:   Cardiac Risk Factors include: advanced age (>71men, >37 women);hypertension     Objective:     Vitals: BP (!) 180/100   Pulse (!) 105   Temp 98.6 F (37 C)   Resp 16   Ht 5\' 8"  (1.727 m)   Wt 152 lb 1.9 oz (69 kg)   SpO2 100%   BMI 23.13 kg/m   Body mass index is 23.13 kg/m.  Advanced Directives 11/13/2016 06/16/2016 08/19/2013  Does Patient Have a Medical Advance Directive? No No Patient does not have advance directive;Patient would like information  Would patient like information on creating a medical advance directive? Yes (MAU/Ambulatory/Procedural Areas - Information given) No - Patient declined Advance directive packet given    Tobacco Social History   Tobacco Use  Smoking Status Never Smoker  Smokeless Tobacco Never Used     Counseling given: Not Answered   Clinical Intake:  Pre-visit preparation completed: Yes  Pain : No/denies pain Pain Score: 0-No pain     Diabetes: No  How often do you need to have someone help you when you read instructions, pamphlets, or other written materials from your doctor or pharmacy?: 1 - Never  Interpreter Needed?: No     Past Medical History:  Diagnosis Date  . Glaucoma   . Hyperlipidemia 2010  . Hypertension 1990  . IGT (impaired glucose tolerance) 05/11/2010  . Multinodular goiter 2010   normal function   Past Surgical History:  Procedure Laterality Date  . COLONOSCOPY N/A 06/16/2016   Procedure: COLONOSCOPY;  Surgeon: Danie Binder, MD;  Location: [facility];  Service: Endoscopy;  Laterality: N/A;  9:30 AM  . EYE SURGERY  2017   3 on left eye 1 on right eye for glaucoma by Dr. Venetia Maxon   . EYE SURGERY  2018   Cataract Surgery by Dr. Arlina Robes    Family History  Problem Relation Age of Onset  . Dementia Father   . Dementia Mother   . Diabetes Mother   .  Hypertension Mother   . Breast cancer Sister   . Hypertension Sister   . Diabetes Brother    Social History   Socioeconomic History  . Marital status: Widowed    Spouse name: Not on file  . Number of children: 3  . Years of education: Not on file  . Highest education level: Not on file  Occupational History  . Occupation: employed   Scientific laboratory technician  . Financial resource strain: Not hard at all  . Food insecurity:    Worry: Never true    Inability: Never true  . Transportation needs:    Medical: No    Non-medical: No  Tobacco Use  . Smoking status: Never Smoker  . Smokeless tobacco: Never Used  Substance and Sexual Activity  . Alcohol use: No  . Drug use: No  . Sexual activity: Never    Birth control/protection: Post-menopausal  Lifestyle  . Physical activity:    Days per week: 5 days    Minutes per session: 60 min  . Stress: Not at all  Relationships  . Social connections:    Talks on phone: More than three times a week    Gets together: More than three times a week    Attends religious service: More than 4 times per year    Active member of club  or organization: Yes    Attends meetings of clubs or organizations: More than 4 times per year    Relationship status: Widowed  Other Topics Concern  . Not on file  Social History Narrative  . Not on file    Outpatient Encounter Medications as of 12/17/2017  Medication Sig  . Aspirin (ADULT ASPIRIN LOW STRENGTH) 81 MG EC tablet Take 81 mg by mouth daily.    . Calcium Carbonate-Vit D-Min (CALCIUM 1200) 1200-1000 MG-UNIT CHEW Chew 1,200 mg by mouth daily.    . cloNIDine (CATAPRES) 0.3 MG tablet TAKE ONE TABLET BY MOUTH AT BEDTIME  . Multiple Vitamins-Minerals (CENTRUM SILVER ULTRA WOMENS) TABS Take 1 tablet by mouth daily.   . pravastatin (PRAVACHOL) 10 MG tablet TAKE 1 TABLET BY MOUTH DAILY *DOSE REDUCTION*  . triamterene-hydrochlorothiazide (MAXZIDE-25) 37.5-25 MG tablet TAKE 1 TABLET BY MOUTH EVERY DAY   No  facility-administered encounter medications on file as of 12/17/2017.     Activities of Daily Living In your present state of health, do you have any difficulty performing the following activities: 12/17/2017  Hearing? N  Vision? N  Difficulty concentrating or making decisions? N  Walking or climbing stairs? N  Dressing or bathing? N  Doing errands, shopping? N  Preparing Food and eating ? N  Using the Toilet? N  In the past six months, have you accidently leaked urine? N  Do you have problems with loss of bowel control? N  Managing your Medications? N  Managing your Finances? N  Housekeeping or managing your Housekeeping? N  Some recent data might be hidden    Patient Care Team: Fayrene Helper, MD as PCP - General Leta Baptist, MD as Consulting Physician (Otolaryngology) Florian Buff, MD as Consulting Physician (Obstetrics and Gynecology) Despina Hick, MD as Consulting Physician (Ophthalmology)    Assessment:   This is a routine wellness examination for [place].  Exercise Activities and Dietary recommendations Current Exercise Habits: Structured exercise class, Type of exercise: walking;Other - see comments, Time (Minutes): 60, Frequency (Times/Week): 5, Weekly Exercise (Minutes/Week): 300, Intensity: Mild, Exercise limited by: None identified  Goals    None      Fall Risk Fall Risk  12/17/2017 05/16/2017 11/13/2016 11/16/2015 05/10/2015  Falls in the past year? No No No No No   Is the patient's home free of loose throw rugs in walkways, pet beds, electrical cords, etc?   no      Grab bars in the bathroom? yes      Handrails on the stairs?   no      Adequate lighting?   yes  Timed Get Up and Go performed:   Depression Screen PHQ 2/9 Scores 12/17/2017 11/13/2016 11/16/2015 05/10/2015  PHQ - 2 Score 0 0 0 0  PHQ- 9 Score - - - 0     Cognitive Function     6CIT Screen 12/17/2017 11/13/2016  What Year? 0 points 0 points  What month? 0 points 0 points  What time? 0  points 0 points  Count back from 20 0 points 0 points  Months in reverse 0 points 0 points  Repeat phrase 0 points 0 points  Total Score 0 0    Immunization History  Administered Date(s) Administered  . Influenza Split 02/15/2011, 02/08/2012  . Influenza Whole 05/10/2009, 03/16/2010  . Influenza,inj,Quad PF,6+ Mos 03/19/2013, 02/04/2014, 01/26/2015, 02/10/2016, 02/13/2017  . Pneumococcal Conjugate-13 12/26/2013  . Pneumococcal Polysaccharide-23 08/19/2013  . Tdap 09/27/2010  . Zoster 12/07/2008  Qualifies for Shingles Vaccine? Ask insurance Screening Tests Health Maintenance  Topic Date Due  . INFLUENZA VACCINE  12/27/2017  . MAMMOGRAM  06/16/2019  . TETANUS/TDAP  09/26/2020  . COLONOSCOPY  06/16/2026  . DEXA SCAN  Completed  . Hepatitis C Screening  Completed  . PNA vac Low Risk Adult  Completed    Cancer Screenings: Lung: Low Dose CT Chest recommended if Age 7-80 years, 30 pack-year currently smoking OR have quit w/in 15years. Patient does not qualify. Breast:  Up to date on Mammogram? Yes   Up to date of Bone Density/Dexa? Yes Colorectal: up to date  Additional Screenings:  Hepatitis C Screening:      Plan:    I have personally reviewed and noted the following in the patient's chart:   . Medical and social history . Use of alcohol, tobacco or illicit drugs  . Current medications and supplements . Functional ability and status . Nutritional status . Physical activity . Advanced directives . List of other physicians . Hospitalizations, surgeries, and ER visits in previous 12 months . Vitals . Screenings to include cognitive, depression, and falls . Referrals and appointments  In addition, I have reviewed and discussed with patient certain preventive protocols, quality metrics, and best practice recommendations. A written personalized care plan for preventive services as well as general preventive health recommendations were provided to patient.      Willette Pa, LPN  [id number]

## 2017-12-17 NOTE — Telephone Encounter (Signed)
Patient needs labs ordered for her next office visit in 2 weeks.  [facility].

## 2017-12-19 ENCOUNTER — Telehealth: Payer: Self-pay | Admitting: Family Medicine

## 2017-12-19 DIAGNOSIS — I10 Essential (primary) hypertension: Secondary | ICD-10-CM

## 2017-12-19 NOTE — Telephone Encounter (Signed)
Done.Marland Kitchen... mailed lab order

## 2017-12-21 ENCOUNTER — Ambulatory Visit: Payer: Medicare Other

## 2017-12-22 ENCOUNTER — Encounter: Payer: Self-pay | Admitting: Family Medicine

## 2017-12-22 NOTE — Assessment & Plan Note (Addendum)
Uncontrolled hypertension ,increase dose of maxzide and continue clonidine and add amlodipine DASH diet and commitment to daily physical activity for a minimum of 30 minutes discussed and encouraged, as a part of hypertension management. The importance of attaining a healthy weight is also discussed.  BP/Weight 12/17/2017 12/17/2017 07/02/2017 05/16/2017 11/13/2016 06/16/2016 89/21/1941  Systolic BP 740 814 481 856 314 970 263  Diastolic BP 90 785 84 80 78 74 82  Wt. (Lbs) 153 152.12 - 154 152.12 147 148  BMI 23.26 23.13 - 23.42 23.13 22.35 22.5     Return in 2 weeks

## 2017-12-22 NOTE — Progress Notes (Signed)
   Wanda Ross     MRN: [id number]      DOB: 03/07/1951   HPI Wanda Ross is here because of uncontrolled hypertension. She was in for her AWV and incidentally her blood pressure was noted to be elevated, so she was brought in to have her blood pressure addressed Has been taking her meds as prescribed, and continues to exercise regularly and eat healthily so she is maintaining a healthy weight Her meds were changed earlier this year and unfortunately the blood pressure is uncontrolled now ROS . Denies chest pains, palpitations, PND , orthopnea, and leg swelling .   Denies dysuria, frequency, hesitancy or incontinence. Denies joint pain, swelling and limitation in mobility. Denies headaches, seizures, numbness, or tingling. Denies depression, anxiety or insomnia. Denies skin break down or rash.   PE  BP (!) 190/90 (BP Location: Left Arm, Patient Position: Sitting, Cuff Size: Normal)   Pulse 100   Ht 5\' 8"  (1.727 m)   Wt 153 lb (69.4 kg)   SpO2 97%   BMI 23.26 kg/m   Patient alert and oriented and in no cardiopulmonary distress.  HEENT: No facial asymmetry, EOMI,   oropharynx pink and moist.  Neck supple no JVD, no mass.  Chest: Clear to auscultation bilaterally.  CVS: S1, S2 no murmurs, no S3.Regular rate.  ABD: Soft non tender.   Ext: No edema  .   Assessment & Plan Essential hypertension Uncontrolled hypertension ,increase dose of maxzide and continue clonidine and add amlodipine DASH diet and commitment to daily physical activity for a minimum of 30 minutes discussed and encouraged, as a part of hypertension management. The importance of attaining a healthy weight is also discussed.  BP/Weight 12/17/2017 12/17/2017 07/02/2017 05/16/2017 11/13/2016 06/16/2016 49/20/1007  Systolic BP 121 975 883 254 982 641 583  Diastolic BP 90 094 84 80 78 74 82  Wt. (Lbs) 153 152.12 - 154 152.12 147 148  BMI 23.26 23.13 - 23.42 23.13 22.35 22.5     Return in 2 weeks

## 2017-12-27 ENCOUNTER — Other Ambulatory Visit: Payer: Self-pay | Admitting: Family Medicine

## 2017-12-27 LAB — BASIC METABOLIC PANEL WITH GFR
BUN/Creatinine Ratio: 18 (calc) (ref 6–22)
BUN: 25 mg/dL (ref 7–25)
CO2: 29 mmol/L (ref 20–32)
Calcium: 9.6 mg/dL (ref 8.6–10.4)
Chloride: 102 mmol/L (ref 98–110)
Creat: 1.36 mg/dL — ABNORMAL HIGH (ref 0.50–0.99)
GFR, Est African American: 46 mL/min/{1.73_m2} — ABNORMAL LOW (ref >=60)
GFR, Est Non African American: 40 mL/min/{1.73_m2} — ABNORMAL LOW (ref >=60)
Glucose, Bld: 88 mg/dL (ref 65–99)
Potassium: 3.6 mmol/L (ref 3.5–5.3)
Sodium: 141 mmol/L (ref 135–146)

## 2017-12-31 ENCOUNTER — Ambulatory Visit (INDEPENDENT_AMBULATORY_CARE_PROVIDER_SITE_OTHER): Payer: Medicare Other | Admitting: Otolaryngology

## 2017-12-31 ENCOUNTER — Encounter: Payer: Self-pay | Admitting: Family Medicine

## 2017-12-31 ENCOUNTER — Ambulatory Visit (INDEPENDENT_AMBULATORY_CARE_PROVIDER_SITE_OTHER): Payer: Medicare Other | Admitting: Family Medicine

## 2017-12-31 ENCOUNTER — Other Ambulatory Visit: Payer: Self-pay

## 2017-12-31 VITALS — BP 180/83 | HR 113 | Resp 12 | Ht 68.0 in | Wt 154.0 lb

## 2017-12-31 DIAGNOSIS — I120 Hypertensive chronic kidney disease with stage 5 chronic kidney disease or end stage renal disease: Secondary | ICD-10-CM | POA: Insufficient documentation

## 2017-12-31 DIAGNOSIS — N185 Chronic kidney disease, stage 5: Secondary | ICD-10-CM

## 2017-12-31 DIAGNOSIS — R9431 Abnormal electrocardiogram [ECG] [EKG]: Secondary | ICD-10-CM | POA: Diagnosis not present

## 2017-12-31 DIAGNOSIS — E7849 Other hyperlipidemia: Secondary | ICD-10-CM | POA: Diagnosis not present

## 2017-12-31 DIAGNOSIS — D440 Neoplasm of uncertain behavior of thyroid gland: Secondary | ICD-10-CM

## 2017-12-31 DIAGNOSIS — I10 Essential (primary) hypertension: Secondary | ICD-10-CM | POA: Diagnosis not present

## 2017-12-31 NOTE — Assessment & Plan Note (Signed)
Pt consistently has anxiety and elevated blood pressure at her office visits and the same is again occurring at her visit today, I have no reason to doubt  The accuracy of her home bP cuff so she does not need ED evaluation because of the elevated reading in the office

## 2017-12-31 NOTE — Assessment & Plan Note (Addendum)
Asymptomatic patient with regular exercise routine and no known f/h of heart disease with EKG suggestive of possible inferior inferior subendocardial injury.  Request urgent review and cardiology evaluation, I have advised her to d/c her exercise until she is evaluated by cardiology also discussed the fact that she should go to the eD if she develops chest tightness, increased shortness of breath or light headedness with activity

## 2017-12-31 NOTE — Progress Notes (Signed)
Wanda Ross     MRN: [id number]      DOB: 14-Jan-1951   HPI Wanda Ross is here for follow up of uncontrolled hypertension  The PT denies any adverse reactions to current medications since the last visit.when she initally started her new medications , 2 of her readings were less than 024 systolic   Reports average  BP at home is 114/72 to 120/70 and her cuff is with her in the office today, this is verified and the cuff does coincide with the readings in the office Patient denies ever having chest pain, exertional fatigue or any known f/h of heart disease She is very actively engaged in regular exercise and is diligent in her diet to maintain good health. She does have white coat hypertension and gets very anxious whenever she is in the office Blood pressure re checked in the office shows a SBP of 097, with a diastolic pressure of 80 and this coincides with what is showing on her meter Labs are reviewed and shows deterioration in renal function since 04/2017, I am unsure if this is due to uncontrolled hTN or in part because of her high dose of maxzisde , so I am electing to reduce the maxzide and rept the chem 7 later this week, along with dasting li[pids and a cardiology ve120/70aluation asap I have advised against regular exercise routine until she is evaluated Her EKG in the office today is abnormal with possible inferior subendocardial injury and tachycardia. Rate of 113    ROS Denies recent fever or chills. Denies sinus pressure, nasal congestion, ear pain or sore throat. Denies chest congestion, productive cough or wheezing. Denies chest pains, palpitations and leg swelling Denies abdominal pain, nausea, vomiting,diarrhea or constipation.   Denies dysuria, frequency, hesitancy or incontinence. Denies joint pain, swelling and limitation in mobility. Denies headaches, seizures, numbness, or tingling. Denies depression, anxiety or insomnia. Denies skin break down or  rash.   PE  BP (!) 180/83   Pulse (!) 113   Resp 12   Ht 5\' 8"  (1.727 m)   Wt 154 lb 0.6 oz (69.9 kg)   SpO2 97% Comment: room air  BMI 23.42 kg/m   Patient alert and oriented and in no cardiopulmonary distress.  HEENT: No facial asymmetry, EOMI,   oropharynx pink and moist.  Neck supple no JVD, no mass.  Chest: Clear to auscultation bilaterally.  CVS: S1, S2 no murmurs, no S3.tachycardia  EKG: Sinus tachycardia rate 113, st abnormality in inferior leads suggestive of subendocardial injury  ABD: Soft non tender.   Ext: No edema  MS: Adequate ROM spine, shoulders, hips and knees.  Skin: Intact, no ulcerations or rash noted.  Psych: Good eye contact, normal affect. Memory intact not anxious or depressed appearing.  CNS: CN 2-12 intact, power,  normal throughout.no focal deficits noted.   Assessment & Plan  Abnormal electrocardiogram (ECG) (EKG) Asymptomatic patient with regular exercise routine and no known f/h of heart disease with EKG suggestive of possible inferior inferior subendocardial injury.  Request urgent review and cardiology evaluation, I have advised her to d/c her exercise until she is evaluated by cardiology also discussed the fact that she should go to the eD if she develops chest tightness, increased shortness of breath or light headedness with activity  Malignant hypertension Improved blood pressure based on readings from home machine which I checked in the office , her average blood pressure is 120/70. Since her renal function has deteriorated and maxzide MAY  be contributing will reduce dose by  50% and repeat lab in 4 days along with lipid profile  White coat syndrome with diagnosis of hypertension Pt consistently has anxiety and elevated blood pressure at her office visits and the same is again occurring at her visit today, I have no reason to doubt  The accuracy of her home bP cuff so she does not need ED evaluation because of the elevated reading in  the office

## 2017-12-31 NOTE — Assessment & Plan Note (Signed)
Improved blood pressure based on readings from home machine which I checked in the office , her average blood pressure is 120/70. Since her renal function has deteriorated and maxzide MAY be contributing will reduce dose by  50% and repeat lab in 4 days along with lipid profile

## 2017-12-31 NOTE — Patient Instructions (Addendum)
F/U in  2nd week week in September call if you need me before  Reduce triamterene to HALF tablet every morning starting tomorrow  Continue other blood pressure meds as before  Your machine is accurate and you DO have white coat coat hypertension and anxiety to a great extent  Your EKG today is not normal ansd suggests prior heart damage, because of this I am referring you to the heart specialist as soon as possible and do not want you to continue exercise until you are seen Please if you do for the first time develop chest pain, fatigue with activity or light headedness go to the nearest  ED  Commit to 64 ounces water daily , reduced salt and processed foods and a lot of fresh fruit and vegetable  Please get  fasting lipid cmp and EGFR this week  Thursday

## 2018-01-03 ENCOUNTER — Encounter: Payer: Self-pay | Admitting: Family Medicine

## 2018-01-03 LAB — COMPLETE METABOLIC PANEL WITH GFR
AG Ratio: 1.5 (calc) (ref 1.0–2.5)
ALT: 16 U/L (ref 6–29)
AST: 26 U/L (ref 10–35)
Albumin: 4.6 g/dL (ref 3.6–5.1)
Alkaline phosphatase (APISO): 72 U/L (ref 33–130)
BUN/Creatinine Ratio: 20 (calc) (ref 6–22)
BUN: 22 mg/dL (ref 7–25)
CO2: 30 mmol/L (ref 20–32)
Calcium: 9.9 mg/dL (ref 8.6–10.4)
Chloride: 99 mmol/L (ref 98–110)
Creat: 1.11 mg/dL — ABNORMAL HIGH (ref 0.50–0.99)
GFR, Est African American: 59 mL/min/{1.73_m2} — ABNORMAL LOW (ref >=60)
GFR, Est Non African American: 51 mL/min/{1.73_m2} — ABNORMAL LOW (ref >=60)
Globulin: 3 g/dL (calc) (ref 1.9–3.7)
Glucose, Bld: 101 mg/dL — ABNORMAL HIGH (ref 65–99)
Potassium: 4 mmol/L (ref 3.5–5.3)
Sodium: 139 mmol/L (ref 135–146)
Total Bilirubin: 0.9 mg/dL (ref 0.2–1.2)
Total Protein: 7.6 g/dL (ref 6.1–8.1)

## 2018-01-03 LAB — LIPID PANEL
Cholesterol: 168 mg/dL (ref <200)
HDL: 40 mg/dL — ABNORMAL LOW (ref >50)
LDL Cholesterol (Calc): 108 mg/dL (calc) — ABNORMAL HIGH
Non-HDL Cholesterol (Calc): 128 mg/dL (calc) (ref <130)
Total CHOL/HDL Ratio: 4.2 (calc) (ref <5.0)
Triglycerides: 107 mg/dL (ref <150)

## 2018-01-11 ENCOUNTER — Ambulatory Visit: Payer: Medicare Other | Admitting: Cardiovascular Disease

## 2018-01-11 ENCOUNTER — Encounter: Payer: Self-pay | Admitting: Cardiovascular Disease

## 2018-01-11 VITALS — BP 190/90 | HR 126 | Ht 68.0 in | Wt 149.0 lb

## 2018-01-11 DIAGNOSIS — E7800 Pure hypercholesterolemia, unspecified: Secondary | ICD-10-CM

## 2018-01-11 DIAGNOSIS — R9431 Abnormal electrocardiogram [ECG] [EKG]: Secondary | ICD-10-CM

## 2018-01-11 DIAGNOSIS — I10 Essential (primary) hypertension: Secondary | ICD-10-CM

## 2018-01-11 NOTE — Progress Notes (Signed)
CARDIOLOGY CONSULT NOTE  Patient ID: BRYNLEE PENNYWELL MRN: [id number] DOB/AGE: December 10, 1950 67 y.o.  Admit date: (Not on file) Primary Physician: Fayrene Helper, MD Referring Physician: Dr. Moshe Cipro  Reason for Consultation: Abnormal ECG  HPI: Wanda Ross is a 66 y.o. female who is being seen today for the evaluation of an abnormal ECG at the request of Fayrene Helper, MD.   I personally reviewed the ECG performed on 12/31/17 which showed sinus tachycardia with 0.5 mm upsloping ST depressions inferiorly and V5 with nonspecific ST segment sagging in V6.  Past medical history includes accelerated hypertension and hyperlipidemia. She has white coat hypertension as well with overall good BP control at home. PCP notes were reviewed.  Lipids 01/03/18: TC 168, HDL 40, TG 107, LDL 108.  BUN 22, SCr 1.11 on 01/03/18.  She brought in her blood pressure monitor which I reviewed.  All readings at home including both blood pressure and heart rate were normal.  The patient denies any symptoms of chest pain, palpitations, shortness of breath, lightheadedness, dizziness, leg swelling, orthopnea, PND, and syncope.  She walks 10,000 steps every single day and does water aerobics on a near daily basis.  Her parents lived into their 71s and her grandmother lived to the age of 71.  There is no history of premature coronary artery disease or stroke.  She is a retired principal.  She has 3 adult children.  Her husband passed away in 09/19/08 of lung cancer and was a longtime smoker.  Her youngest daughter, [name], played college basketball for [employer] and was [employer] of the year twice.  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  She has a son who teaches PE in [place].  Her eldest son works for [employer].    No Known Allergies  Current Outpatient Medications  Medication Sig Dispense Refill  . amLODipine (NORVASC) 5 MG tablet Take 1 tablet (5 mg total) by mouth daily. 90  tablet 1  . Aspirin (ADULT ASPIRIN LOW STRENGTH) 81 MG EC tablet Take 81 mg by mouth daily.      . Calcium Carbonate-Vit D-Min (CALCIUM 1200) 1200-1000 MG-UNIT CHEW Chew 1,200 mg by mouth daily.      . cloNIDine (CATAPRES) 0.3 MG tablet TAKE ONE TABLET BY MOUTH AT BEDTIME 90 tablet 0  . Multiple Vitamins-Minerals (CENTRUM SILVER ULTRA WOMENS) TABS Take 1 tablet by mouth daily.     . pravastatin (PRAVACHOL) 10 MG tablet TAKE 1 TABLET BY MOUTH DAILY *DOSE REDUCTION* 90 tablet 1  . triamterene-hydrochlorothiazide (MAXZIDE) 75-50 MG tablet Take 1 tablet by mouth daily. 90 tablet 1   No current facility-administered medications for this visit.     Past Medical History:  Diagnosis Date  . Glaucoma   . Hyperlipidemia 09-19-2008  . Hypertension 1990  . IGT (impaired glucose tolerance) 05/11/2010  . Multinodular goiter 2008-09-19   normal function    Past Surgical History:  Procedure Laterality Date  . COLONOSCOPY N/A 06/16/2016   Procedure: COLONOSCOPY;  Surgeon: Danie Binder, MD;  Location: [facility];  Service: Endoscopy;  Laterality: N/A;  9:30 AM  . EYE SURGERY  2015-09-20   3 on left eye 1 on right eye for glaucoma by Dr. Venetia Maxon   . EYE SURGERY  09/19/2016   Cataract Surgery by Dr. Arlina Robes     Social History   Socioeconomic History  . Marital status: Widowed    Spouse name: Not on file  .  Number of children: 3  . Years of education: Not on file  . Highest education level: Not on file  Occupational History  . Occupation: employed   Scientific laboratory technician  . Financial resource strain: Not hard at all  . Food insecurity:    Worry: Never true    Inability: Never true  . Transportation needs:    Medical: No    Non-medical: No  Tobacco Use  . Smoking status: Never Smoker  . Smokeless tobacco: Never Used  Substance and Sexual Activity  . Alcohol use: No  . Drug use: No  . Sexual activity: Never    Birth control/protection: Post-menopausal  Lifestyle  . Physical activity:    Days per week: 5 days     Minutes per session: 60 min  . Stress: Not at all  Relationships  . Social connections:    Talks on phone: More than three times a week    Gets together: More than three times a week    Attends religious service: More than 4 times per year    Active member of club or organization: Yes    Attends meetings of clubs or organizations: More than 4 times per year    Relationship status: Widowed  . Intimate partner violence:    Fear of current or ex partner: No    Emotionally abused: No    Physically abused: No    Forced sexual activity: No  Other Topics Concern  . Not on file  Social History Narrative  . Not on file     No family history of premature CAD in 1st degree relatives.  Current Meds  Medication Sig  . amLODipine (NORVASC) 5 MG tablet Take 1 tablet (5 mg total) by mouth daily.  . Aspirin (ADULT ASPIRIN LOW STRENGTH) 81 MG EC tablet Take 81 mg by mouth daily.    . Calcium Carbonate-Vit D-Min (CALCIUM 1200) 1200-1000 MG-UNIT CHEW Chew 1,200 mg by mouth daily.    . cloNIDine (CATAPRES) 0.3 MG tablet TAKE ONE TABLET BY MOUTH AT BEDTIME  . Multiple Vitamins-Minerals (CENTRUM SILVER ULTRA WOMENS) TABS Take 1 tablet by mouth daily.   . pravastatin (PRAVACHOL) 10 MG tablet TAKE 1 TABLET BY MOUTH DAILY *DOSE REDUCTION*  . triamterene-hydrochlorothiazide (MAXZIDE) 75-50 MG tablet Take 1 tablet by mouth daily.      Review of systems complete and found to be negative unless listed above in HPI    Physical exam Blood pressure (!) 190/90, pulse (!) 126, height 5\' 8"  (1.727 m), weight 149 lb (67.6 kg), SpO2 96 %. General: NAD Neck: No JVD, no thyromegaly or thyroid nodule.  Lungs: Clear to auscultation bilaterally with normal respiratory effort. CV: Nondisplaced PMI.  Tachycardic, regular rhythm, normal S1/S2, no S3/S4, no murmur.  No peripheral edema.  No carotid bruit.   Abdomen: Soft, nontender, no distention.  Skin: Intact without lesions or rashes.  Neurologic: Alert and  oriented x 3.  Psych: Normal affect. Extremities: No clubbing or cyanosis.  HEENT: Normal.   ECG: Most recent ECG reviewed.   Labs: Lab Results  Component Value Date/Time   K 4.0 01/03/2018 08:02 AM   BUN 22 01/03/2018 08:02 AM   CREATININE 1.11 (H) 01/03/2018 08:02 AM   ALT 16 01/03/2018 08:02 AM   TSH 2.06 05/11/2017 08:56 AM   HGB 14.3 05/11/2017 08:56 AM     Lipids: Lab Results  Component Value Date/Time   LDLCALC 108 (H) 01/03/2018 08:02 AM   CHOL 168 01/03/2018 08:02 AM  TRIG 107 01/03/2018 08:02 AM   HDL 40 (L) 01/03/2018 08:02 AM        ASSESSMENT AND PLAN:  1.  Abnormal ECG: I think these findings are nonspecific and were provoked by severe whitecoat hypertension and tachycardia.  Her blood pressure and heart rates are always normal at home.  She exercises on a routine basis with no limiting symptoms whatsoever.  There is no family history of heart disease or stroke.  I do not feel any cardiac testing is indicated at this time.  I told her should she develop any chest pain or shortness of breath or fatigue, to contact me and I will proceed with noninvasive imaging.  2.  Accelerated hypertension/whitecoat hypertension: I reviewed her blood pressure monitor and all home readings are normal with normal heart rates.  No medical therapy adjustments are indicated at this time.  3.  Hypercholesterolemia: Lipids reviewed above.  Continue pravastatin.   Disposition: Follow up as needed   Signed: Kate Sable, M.D., F.A.C.C.  01/11/2018, 3:45 PM

## 2018-01-11 NOTE — Patient Instructions (Signed)
Your physician recommends that you schedule a follow-up appointment in: as needed with [facility]   Your physician recommends that you continue on your current medications as directed. Please refer to the Current Medication list given to you today.     No testing today !      Thank you for choosing [facility] !

## 2018-02-06 ENCOUNTER — Encounter: Payer: Self-pay | Admitting: Family Medicine

## 2018-02-06 ENCOUNTER — Ambulatory Visit: Payer: Medicare Other | Admitting: Family Medicine

## 2018-02-06 VITALS — BP 158/80 | HR 96 | Resp 16 | Ht 68.0 in | Wt 153.0 lb

## 2018-02-06 DIAGNOSIS — I10 Essential (primary) hypertension: Secondary | ICD-10-CM

## 2018-02-06 DIAGNOSIS — H409 Unspecified glaucoma: Secondary | ICD-10-CM

## 2018-02-06 DIAGNOSIS — E559 Vitamin D deficiency, unspecified: Secondary | ICD-10-CM | POA: Diagnosis not present

## 2018-02-06 DIAGNOSIS — Z23 Encounter for immunization: Secondary | ICD-10-CM | POA: Diagnosis not present

## 2018-02-06 MED ORDER — CLONIDINE HCL 0.3 MG PO TABS: 0.3000 mg | ORAL_TABLET | Freq: Every day | ORAL | 1 refills | Status: DC

## 2018-02-06 NOTE — Patient Instructions (Addendum)
F/U in 5.5 months , call if you need me before  Flu vaccine today  I am THANKFUL that you have ACED the game!!!  Fasting lipid ,CBC, , cmp and EGFR, TSh, and Vit D,Dec 15 or shortly after  Keep exercising and eating healthily, do NOT worry  Thank you  for choosing [facility]. We consider it a privelige to serve you.  Delivering excellent health care in a caring and  compassionate way is our goal.  Partnering with you,  so that together we can achieve this goal is our strategy.

## 2018-02-07 ENCOUNTER — Other Ambulatory Visit: Payer: Self-pay

## 2018-02-07 MED ORDER — TRIAMTERENE-HCTZ 75-50 MG PO TABS: 1.0000 | ORAL_TABLET | Freq: Every day | ORAL | 1 refills | Status: DC

## 2018-02-08 ENCOUNTER — Other Ambulatory Visit: Payer: Self-pay | Admitting: Family Medicine

## 2018-02-10 ENCOUNTER — Encounter: Payer: Self-pay | Admitting: Family Medicine

## 2018-02-10 NOTE — Assessment & Plan Note (Signed)
After obtaining informed consent, the vaccine is  administered by LPN.  

## 2018-02-10 NOTE — Assessment & Plan Note (Signed)
Elevated blood pressure in office as expected, howeverlog has normal blood pressures at home and her meter is accurate No medication change DASH diet and commitment to daily physical activity for a minimum of 30 minutes discussed and encouraged, as a part of hypertension management. The importance of attaining a healthy weight is also discussed.  BP/Weight 02/06/2018 01/11/2018 12/31/2017 12/17/2017 12/17/2017 07/02/2017 76/73/4193  Systolic BP 790 240 973 532 992 426 834  Diastolic BP 80 90 83 90 196 84 80  Wt. (Lbs) 153 149 154.04 153 152.12 - 154  BMI 23.26 22.66 23.42 23.26 23.13 - 23.42

## 2018-02-10 NOTE — Progress Notes (Signed)
   MORGYN MARUT     MRN: [id number]      DOB: 06/13/1950   HPI Ms. Laker is here for follow up and re-evaluation of hypertension and abnormal EKG We have an established diagnosis of "white coat hypertension" along with essential hypertension, and her EKG warranted no further testing. She is relieved to have had everything check out well, and is back to her healthy routine of daily exercise ROS Denies recent fever or chills. Denies sinus pressure, nasal congestion, ear pain or sore throat. Denies chest congestion, productive cough or wheezing. Denies chest pains, palpitations and leg swelling Denies abdominal pain, nausea, vomiting,diarrhea or constipation.   Denies dysuria, frequency, hesitancy or incontinence. Denies joint pain, swelling and limitation in mobility. Denies headaches, seizures, numbness, or tingling. Denies depression, anxiety or insomnia. Denies skin break down or rash.   PE  BP (!) 158/80   Pulse 96   Resp 16   Ht 5\' 8"  (1.727 m)   Wt 153 lb (69.4 kg)   SpO2 99%   BMI 23.26 kg/m   Patient alert and oriented and in no cardiopulmonary distress.  HEENT: No facial asymmetry, EOMI,   oropharynx pink and moist.  Neck supple no JVD, no mass.  Chest: Clear to auscultation bilaterally.  CVS: S1, S2 no murmurs, no S3.Regular rate.  ABD: Soft non tender.   Ext: No edema  MS: Adequate ROM spine, shoulders, hips and knees.  Skin: Intact, no ulcerations or rash noted.  Psych: Good eye contact, normal affect. Memory intact not anxious or depressed appearing.  CNS: CN 2-12 intact, power,  normal throughout.no focal deficits noted.   Assessment & Plan White coat syndrome with diagnosis of hypertension Elevated blood pressure in office as expected, howeverlog has normal blood pressures at home and her meter is accurate No medication change DASH diet and commitment to daily physical activity for a minimum of 30 minutes discussed and encouraged, as a part  of hypertension management. The importance of attaining a healthy weight is also discussed.  BP/Weight 02/06/2018 01/11/2018 12/31/2017 12/17/2017 12/17/2017 07/02/2017 40/97/3532  Systolic BP 992 426 834 196 222 979 892  Diastolic BP 80 90 83 90 119 84 80  Wt. (Lbs) 153 149 154.04 153 152.12 - 154  BMI 23.26 22.66 23.42 23.26 23.13 - 23.42       Glaucoma Normal vision and on no medication in 01/2018  Need for immunization against influenza After obtaining informed consent, the vaccine is  administered by LPN.

## 2018-02-10 NOTE — Assessment & Plan Note (Addendum)
Normal vision and on no medication in 01/2018

## 2018-02-12 ENCOUNTER — Ambulatory Visit: Payer: Medicare Other | Admitting: Cardiovascular Disease

## 2018-02-12 DIAGNOSIS — Z23 Encounter for immunization: Secondary | ICD-10-CM | POA: Diagnosis not present

## 2018-05-18 LAB — COMPLETE METABOLIC PANEL WITH GFR
AG Ratio: 1.8 (calc) (ref 1.0–2.5)
ALT: 16 U/L (ref 6–29)
AST: 21 U/L (ref 10–35)
Albumin: 4.6 g/dL (ref 3.6–5.1)
Alkaline phosphatase (APISO): 75 U/L (ref 33–130)
BUN: 20 mg/dL (ref 7–25)
CO2: 30 mmol/L (ref 20–32)
Calcium: 10.3 mg/dL (ref 8.6–10.4)
Chloride: 102 mmol/L (ref 98–110)
Creat: 0.88 mg/dL (ref 0.50–0.99)
GFR, Est African American: 78 mL/min/{1.73_m2} (ref >=60)
GFR, Est Non African American: 67 mL/min/{1.73_m2} (ref >=60)
Globulin: 2.5 g/dL (calc) (ref 1.9–3.7)
Glucose, Bld: 93 mg/dL (ref 65–99)
Potassium: 3.7 mmol/L (ref 3.5–5.3)
Sodium: 141 mmol/L (ref 135–146)
Total Bilirubin: 0.5 mg/dL (ref 0.2–1.2)
Total Protein: 7.1 g/dL (ref 6.1–8.1)

## 2018-05-18 LAB — CBC
HCT: 42.6 % (ref 35.0–45.0)
Hemoglobin: 14.6 g/dL (ref 11.7–15.5)
MCH: 28.6 pg (ref 27.0–33.0)
MCHC: 34.3 g/dL (ref 32.0–36.0)
MCV: 83.5 fL (ref 80.0–100.0)
MPV: 9.3 fL (ref 7.5–12.5)
Platelets: 411 10*3/uL — ABNORMAL HIGH (ref 140–400)
RBC: 5.1 10*6/uL (ref 3.80–5.10)
RDW: 13 % (ref 11.0–15.0)
WBC: 6.8 10*3/uL (ref 3.8–10.8)

## 2018-05-18 LAB — TSH: TSH: 3.03 mIU/L (ref 0.40–4.50)

## 2018-05-18 LAB — VITAMIN D 25 HYDROXY (VIT D DEFICIENCY, FRACTURES): Vit D, 25-Hydroxy: 33 ng/mL (ref 30–100)

## 2018-05-28 ENCOUNTER — Other Ambulatory Visit: Payer: Self-pay | Admitting: Family Medicine

## 2018-06-02 ENCOUNTER — Other Ambulatory Visit: Payer: Self-pay | Admitting: Family Medicine

## 2018-06-08 ENCOUNTER — Other Ambulatory Visit: Payer: Self-pay | Admitting: Family Medicine

## 2018-06-10 ENCOUNTER — Other Ambulatory Visit (HOSPITAL_COMMUNITY): Payer: Self-pay | Admitting: Family Medicine

## 2018-06-10 DIAGNOSIS — Z1231 Encounter for screening mammogram for malignant neoplasm of breast: Secondary | ICD-10-CM

## 2018-06-21 ENCOUNTER — Ambulatory Visit (HOSPITAL_COMMUNITY)
Admission: RE | Admit: 2018-06-21 | Discharge: 2018-06-21 | Disposition: A | Discharge status: Home / Self Care | Payer: Medicare Other | Source: Ambulatory Visit | Attending: Family Medicine | Admitting: Family Medicine

## 2018-06-21 DIAGNOSIS — Z1231 Encounter for screening mammogram for malignant neoplasm of breast: Secondary | ICD-10-CM | POA: Insufficient documentation

## 2018-07-11 ENCOUNTER — Ambulatory Visit (INDEPENDENT_AMBULATORY_CARE_PROVIDER_SITE_OTHER): Payer: Medicare Other | Admitting: Family Medicine

## 2018-07-11 ENCOUNTER — Encounter: Payer: Self-pay | Admitting: Family Medicine

## 2018-07-11 VITALS — BP 148/82 | HR 99 | Resp 16 | Ht 68.0 in | Wt 154.0 lb

## 2018-07-11 DIAGNOSIS — D126 Benign neoplasm of colon, unspecified: Secondary | ICD-10-CM

## 2018-07-11 DIAGNOSIS — E7849 Other hyperlipidemia: Secondary | ICD-10-CM | POA: Diagnosis not present

## 2018-07-11 DIAGNOSIS — E042 Nontoxic multinodular goiter: Secondary | ICD-10-CM

## 2018-07-11 DIAGNOSIS — R9431 Abnormal electrocardiogram [ECG] [EKG]: Secondary | ICD-10-CM

## 2018-07-11 DIAGNOSIS — I10 Essential (primary) hypertension: Secondary | ICD-10-CM | POA: Diagnosis not present

## 2018-07-11 NOTE — Progress Notes (Signed)
   Wanda Ross     MRN: [id number]      DOB: 1950-10-08   HPI Wanda Ross is here for follow up and re-evaluation of chronic medical conditions, medication management and review of any available recent lab and radiology data.  Preventive health is updated, specifically  Cancer screening and Immunization.   Questions or concerns regarding consultations or procedures which the PT has had in the interim are  addressed. The PT denies any adverse reactions to current medications since the last visit.  There are no new concerns.  There are no specific complaints   ROS Denies recent fever or chills. Denies sinus pressure, nasal congestion, ear pain or sore throat. Denies chest congestion, productive cough or wheezing. Denies chest pains, palpitations and leg swelling Denies abdominal pain, nausea, vomiting,diarrhea or constipation.   Denies dysuria, frequency, hesitancy or incontinence. Denies joint pain, swelling and limitation in mobility. Denies headaches, seizures, numbness, or tingling. Denies depression, anxiety or insomnia. Denies skin break down or rash.   PE  BP (!) 158/82   Pulse 99   Resp 16   Ht 5\' 8"  (1.727 m)   Wt 154 lb (69.9 kg)   SpO2 98%   BMI 23.42 kg/m   Patient alert and oriented and in no cardiopulmonary distress.  HEENT: No facial asymmetry, EOMI,   oropharynx pink and moist.  Neck supple no JVD, no mass.  Chest: Clear to auscultation bilaterally.  CVS: S1, S2 no murmurs, no S3.Regular rate.  ABD: Soft non tender.   Ext: No edema  MS: Adequate ROM spine, shoulders, hips and knees.  Skin: Intact, no ulcerations or rash noted.  Psych: Good eye contact, normal affect. Memory intact not anxious or depressed appearing.  CNS: CN 2-12 intact, power,  normal throughout.no focal deficits noted.   Assessment & Plan  White coat syndrome with diagnosis of hypertension Controlled, no change in medication DASH diet and commitment to daily physical  activity for a minimum of 30 minutes discussed and encouraged, as a part of hypertension management. The importance of attaining a healthy weight is also discussed.  BP/Weight 07/11/2018 02/06/2018 01/11/2018 12/31/2017 12/17/2017 0/11/1217 12/01/8830  Systolic BP 549 826 415 830 940 768 088  Diastolic BP 82 80 90 83 90 100 84  Wt. (Lbs) 154 153 149 154.04 153 152.12 -  BMI 23.42 23.26 22.66 23.42 23.26 23.13 -   Pt has home meter has been checked in trhe past and this is accurate  Hyperlipidemia Hyperlipidemia:Low fat diet discussed and encouraged.   Lipid Panel  Lab Results  Component Value Date   CHOL 168 01/03/2018   HDL 40 (L) 01/03/2018   LDLCALC 108 (H) 01/03/2018   TRIG 107 01/03/2018   CHOLHDL 4.2 01/03/2018   Not at goal, Updated lab needed at/ before next visit. Needs to continue exercise commitment and lower fat intake    Multiple thyroid nodules Followed by ENT , has upcoming re eval including [place]  Colon adenomas Rept colonoscopy in 2021  Abnormal electrocardiogram (ECG) (EKG) Evaluated by cardiology in 2019

## 2018-07-11 NOTE — Patient Instructions (Addendum)
Wellness with nurse end August, flu vaccine at that visit, call if you need me before MD follow up first week in January, call in November for January labs  Schedule your annual exam  With gyne Congrats on excellent health habits , labs are excellent, hopefully you will continue to reap the rewards  Continue checking BP at home regularly, only cause for concern is if you consistently;y get numbers of 140/  Or more as the upper number. Lower number should be 80 or less consistently and this is what I see on your reliable home record  It is important that you exercise regularly at least 60 minutes 5 times a week. If you develop chest pain, have severe difficulty breathing, or feel very tired, stop exercising immediately and seek medical attention   Thank you  for choosing [facility]. We consider it a privelige to serve you.  Delivering excellent health care in a caring and  compassionate way is our goal.  Partnering with you,  so that together we can achieve this goal is our strategy.

## 2018-07-12 ENCOUNTER — Encounter: Payer: Self-pay | Admitting: Family Medicine

## 2018-07-12 DIAGNOSIS — D126 Benign neoplasm of colon, unspecified: Secondary | ICD-10-CM | POA: Insufficient documentation

## 2018-07-12 NOTE — Assessment & Plan Note (Signed)
Evaluated by cardiology in 2019

## 2018-07-12 NOTE — Assessment & Plan Note (Signed)
Controlled, no change in medication DASH diet and commitment to daily physical activity for a minimum of 30 minutes discussed and encouraged, as a part of hypertension management. The importance of attaining a healthy weight is also discussed.  BP/Weight 07/11/2018 02/06/2018 01/11/2018 12/31/2017 12/17/2017 12/14/3670 10/01/14  Systolic BP 429 037 955 831 674 255 258  Diastolic BP 82 80 90 83 90 100 84  Wt. (Lbs) 154 153 149 154.04 153 152.12 -  BMI 23.42 23.26 22.66 23.42 23.26 23.13 -   Pt has home meter has been checked in trhe past and this is accurate

## 2018-07-12 NOTE — Assessment & Plan Note (Addendum)
Followed by ENT , has upcoming re eval including [place]

## 2018-07-12 NOTE — Assessment & Plan Note (Signed)
Rept colonoscopy in 2021

## 2018-07-12 NOTE — Assessment & Plan Note (Signed)
Hyperlipidemia:Low fat diet discussed and encouraged.   Lipid Panel  Lab Results  Component Value Date   CHOL 168 01/03/2018   HDL 40 (L) 01/03/2018   LDLCALC 108 (H) 01/03/2018   TRIG 107 01/03/2018   CHOLHDL 4.2 01/03/2018   Not at goal, Updated lab needed at/ before next visit. Needs to continue exercise commitment and lower fat intake

## 2018-09-22 ENCOUNTER — Other Ambulatory Visit: Payer: Self-pay | Admitting: Family Medicine

## 2018-10-03 ENCOUNTER — Encounter: Payer: Self-pay | Admitting: *Deleted

## 2018-12-03 ENCOUNTER — Other Ambulatory Visit: Payer: Self-pay | Admitting: Family Medicine

## 2018-12-04 ENCOUNTER — Other Ambulatory Visit (HOSPITAL_COMMUNITY): Payer: Self-pay | Admitting: Otolaryngology

## 2018-12-04 ENCOUNTER — Other Ambulatory Visit: Payer: Self-pay | Admitting: Otolaryngology

## 2018-12-04 DIAGNOSIS — E041 Nontoxic single thyroid nodule: Secondary | ICD-10-CM

## 2018-12-10 ENCOUNTER — Ambulatory Visit (HOSPITAL_COMMUNITY)
Admission: RE | Admit: 2018-12-10 | Discharge: 2018-12-10 | Disposition: A | Discharge status: Home / Self Care | Payer: Medicare Other | Source: Ambulatory Visit | Attending: Otolaryngology | Admitting: Otolaryngology

## 2018-12-10 ENCOUNTER — Other Ambulatory Visit: Payer: Self-pay

## 2018-12-10 DIAGNOSIS — E041 Nontoxic single thyroid nodule: Secondary | ICD-10-CM | POA: Insufficient documentation

## 2018-12-19 ENCOUNTER — Encounter: Payer: Self-pay | Admitting: Family Medicine

## 2018-12-19 ENCOUNTER — Other Ambulatory Visit: Payer: Self-pay

## 2018-12-19 ENCOUNTER — Ambulatory Visit (INDEPENDENT_AMBULATORY_CARE_PROVIDER_SITE_OTHER): Payer: Medicare Other | Admitting: Family Medicine

## 2018-12-19 VITALS — BP 125/64 | HR 100 | Temp 98.2°F | Resp 12 | Ht 68.0 in | Wt 152.1 lb

## 2018-12-19 DIAGNOSIS — Z0000 Encounter for general adult medical examination without abnormal findings: Secondary | ICD-10-CM | POA: Diagnosis not present

## 2018-12-19 NOTE — Patient Instructions (Addendum)
Wanda Ross , Thank you for taking time to come for your Medicare Wellness Visit. I appreciate your ongoing commitment to your health goals. Please review the following plan we discussed and let me know if I can assist you in the future.   Please continue to practice social distancing to keep you, your family, and our community safe.  If you must go out, please wear a Mask and practice good handwashing.  Screening recommendations/referrals: Colonoscopy: Due 2021 Mammogram: Due 2021 Bone Density: Completed  Recommended yearly ophthalmology/optometry visit for glaucoma screening and checkup Recommended yearly dental visit for hygiene and checkup  Vaccinations: Influenza vaccine: Due Fall 2020 Pneumococcal vaccine: Completed  Tdap vaccine: Due 2022 Shingles vaccine: Zoster-Completed   Advanced directives: Paperwork provided   Conditions/risks identified: Falls   Next appointment: 06/04/2019   Preventive Care 70 Years and Older, Female Preventive care refers to lifestyle choices and visits with your health care provider that can promote health and wellness. What does preventive care include?  A yearly physical exam. This is also called an annual well check.  Dental exams once or twice a year.  Routine eye exams. Ask your health care provider how often you should have your eyes checked.  Personal lifestyle choices, including:  Daily care of your teeth and gums.  Regular physical activity.  Eating a healthy diet.  Avoiding tobacco and drug use.  Limiting alcohol use.  Practicing safe sex.  Taking low-dose aspirin every day.  Taking vitamin and mineral supplements as recommended by your health care provider. What happens during an annual well check? The services and screenings done by your health care provider during your annual well check will depend on your age, overall health, lifestyle risk factors, and family history of disease. Counseling  Your health care  provider may ask you questions about your:  Alcohol use.  Tobacco use.  Drug use.  Emotional well-being.  Home and relationship well-being.  Sexual activity.  Eating habits.  History of falls.  Memory and ability to understand (cognition).  Work and work Statistician.  Reproductive health. Screening  You may have the following tests or measurements:  Height, weight, and BMI.  Blood pressure.  Lipid and cholesterol levels. These may be checked every 5 years, or more frequently if you are over 52 years old.  Skin check.  Lung cancer screening. You may have this screening every year starting at age 56 if you have a 30-pack-year history of smoking and currently smoke or have quit within the past 15 years.  Fecal occult blood test (FOBT) of the stool. You may have this test every year starting at age 63.  Flexible sigmoidoscopy or colonoscopy. You may have a sigmoidoscopy every 5 years or a colonoscopy every 10 years starting at age 11.  Hepatitis C blood test.  Hepatitis B blood test.  Sexually transmitted disease (STD) testing.  Diabetes screening. This is done by checking your blood sugar (glucose) after you have not eaten for a while (fasting). You may have this done every 1-3 years.  Bone density scan. This is done to screen for osteoporosis. You may have this done starting at age 70.  Mammogram. This may be done every 1-2 years. Talk to your health care provider about how often you should have regular mammograms. Talk with your health care provider about your test results, treatment options, and if necessary, the need for more tests. Vaccines  Your health care provider may recommend certain vaccines, such as:  Influenza vaccine. This  is recommended every year.  Tetanus, diphtheria, and acellular pertussis (Tdap, Td) vaccine. You may need a Td booster every 10 years.  Zoster vaccine. You may need this after age 10.  Pneumococcal 13-valent conjugate (PCV13)  vaccine. One dose is recommended after age 54.  Pneumococcal polysaccharide (PPSV23) vaccine. One dose is recommended after age 44. Talk to your health care provider about which screenings and vaccines you need and how often you need them. This information is not intended to replace advice given to you by your health care provider. Make sure you discuss any questions you have with your health care provider. Document Released: 06/11/2015 Document Revised: 02/02/2016 Document Reviewed: 03/16/2015 Elsevier Interactive Patient Education  2017 [place] Prevention in the Home Falls can cause injuries. They can happen to people of all ages. There are many things you can do to make your home safe and to help prevent falls. What can I do on the outside of my home?  Regularly fix the edges of walkways and driveways and fix any cracks.  Remove anything that might make you trip as you walk through a door, such as a raised step or threshold.  Trim any bushes or trees on the path to your home.  Use bright outdoor lighting.  Clear any walking paths of anything that might make someone trip, such as rocks or tools.  Regularly check to see if handrails are loose or broken. Make sure that both sides of any steps have handrails.  Any raised decks and porches should have guardrails on the edges.  Have any leaves, snow, or ice cleared regularly.  Use sand or salt on walking paths during winter.  Clean up any spills in your garage right away. This includes oil or grease spills. What can I do in the bathroom?  Use night lights.  Install grab bars by the toilet and in the tub and shower. Do not use towel bars as grab bars.  Use non-skid mats or decals in the tub or shower.  If you need to sit down in the shower, use a plastic, non-slip stool.  Keep the floor dry. Clean up any water that spills on the floor as soon as it happens.  Remove soap buildup in the tub or shower regularly.   Attach bath mats securely with double-sided non-slip rug tape.  Do not have throw rugs and other things on the floor that can make you trip. What can I do in the bedroom?  Use night lights.  Make sure that you have a light by your bed that is easy to reach.  Do not use any sheets or blankets that are too big for your bed. They should not hang down onto the floor.  Have a firm chair that has side arms. You can use this for support while you get dressed.  Do not have throw rugs and other things on the floor that can make you trip. What can I do in the kitchen?  Clean up any spills right away.  Avoid walking on wet floors.  Keep items that you use a lot in easy-to-reach places.  If you need to reach something above you, use a strong step stool that has a grab bar.  Keep electrical cords out of the way.  Do not use floor polish or wax that makes floors slippery. If you must use wax, use non-skid floor wax.  Do not have throw rugs and other things on the floor that can make you  trip. What can I do with my stairs?  Do not leave any items on the stairs.  Make sure that there are handrails on both sides of the stairs and use them. Fix handrails that are broken or loose. Make sure that handrails are as long as the stairways.  Check any carpeting to make sure that it is firmly attached to the stairs. Fix any carpet that is loose or worn.  Avoid having throw rugs at the top or bottom of the stairs. If you do have throw rugs, attach them to the floor with carpet tape.  Make sure that you have a light switch at the top of the stairs and the bottom of the stairs. If you do not have them, ask someone to add them for you. What else can I do to help prevent falls?  Wear shoes that:  Do not have high heels.  Have rubber bottoms.  Are comfortable and fit you well.  Are closed at the toe. Do not wear sandals.  If you use a stepladder:  Make sure that it is fully opened. Do not climb  a closed stepladder.  Make sure that both sides of the stepladder are locked into place.  Ask someone to hold it for you, if possible.  Clearly mark and make sure that you can see:  Any grab bars or handrails.  First and last steps.  Where the edge of each step is.  Use tools that help you move around (mobility aids) if they are needed. These include:  Canes.  Walkers.  Scooters.  Crutches.  Turn on the lights when you go into a dark area. Replace any light bulbs as soon as they burn out.  Set up your furniture so you have a clear path. Avoid moving your furniture around.  If any of your floors are uneven, fix them.  If there are any pets around you, be aware of where they are.  Review your medicines with your doctor. Some medicines can make you feel dizzy. This can increase your chance of falling. Ask your doctor what other things that you can do to help prevent falls. This information is not intended to replace advice given to you by your health care provider. Make sure you discuss any questions you have with your health care provider. Document Released: 03/11/2009 Document Revised: 10/21/2015 Document Reviewed: 06/19/2014 Elsevier Interactive Patient Education  2017 [employer].

## 2018-12-19 NOTE — Progress Notes (Addendum)
Subjective:   Wanda Ross is a 68 y.o. female who presents for Medicare Annual (Subsequent) preventive examination.  Review of Systems:    Cardiac Risk Factors include: advanced age (>47men, >8 women);hypertension;dyslipidemia     Objective:     Vitals: BP 125/64   Pulse 100   Temp 98.2 F (36.8 C) (Temporal)   Resp 12   Ht 5\' 8"  (1.727 m)   Wt 152 lb 1.9 oz (69 kg)   SpO2 99%   BMI 23.13 kg/m   Body mass index is 23.13 kg/m.  Advanced Directives 11/13/2016 06/16/2016 08/19/2013  Does Patient Have a Medical Advance Directive? No No Patient does not have advance directive;Patient would like information  Would patient like information on creating a medical advance directive? Yes (MAU/Ambulatory/Procedural Areas - Information given) No - Patient declined Advance directive packet given    Tobacco Social History   Tobacco Use  Smoking Status Never Smoker  Smokeless Tobacco Never Used     Counseling given: Yes   Clinical Intake:  Pre-visit preparation completed: Yes  Pain : No/denies pain Pain Score: 0-No pain     BMI - recorded: 23.13 Nutritional Status: BMI of 19-24  Normal Nutritional Risks: None Diabetes: No  How often do you need to have someone help you when you read instructions, pamphlets, or other written materials from your doctor or pharmacy?: 1 - Never What is the last grade level you completed in school?: Masters Degree  Interpreter Needed?: No     Past Medical History:  Diagnosis Date  . Glaucoma   . Hyperlipidemia 2010  . Hypertension 1990  . IGT (impaired glucose tolerance) 05/11/2010  . Multinodular goiter 2010   normal function   Past Surgical History:  Procedure Laterality Date  . COLONOSCOPY N/A 06/16/2016   Procedure: COLONOSCOPY;  Surgeon: Danie Binder, MD;  Location[facility];  Service: Endoscopy;  Laterality: N/A;  9:30 AM  . EYE SURGERY  2017   3 on left eye 1 on right eye for glaucoma by Dr. Venetia Maxon   . EYE  SURGERY  2018   Cataract Surgery by Dr. Arlina Robes    Family History  Problem Relation Age of Onset  . Dementia Father   . Dementia Mother   . Diabetes Mother   . Hypertension Mother   . Breast cancer Sister   . Hypertension Sister   . Diabetes Brother    Social History   Socioeconomic History  . Marital status: Widowed    Spouse name: Not on file  . Number of children: 3  . Years of education: Not on file  . Highest education level: Not on file  Occupational History  . Occupation: employed   Scientific laboratory technician  . Financial resource strain: Not hard at all  . Food insecurity    Worry: Never true    Inability: Never true  . Transportation needs    Medical: No    Non-medical: No  Tobacco Use  . Smoking status: Never Smoker  . Smokeless tobacco: Never Used  Substance and Sexual Activity  . Alcohol use: No  . Drug use: No  . Sexual activity: Never    Birth control/protection: Post-menopausal  Lifestyle  . Physical activity    Days per week: 5 days    Minutes per session: 60 min  . Stress: Not at all  Relationships  . Social connections    Talks on phone: More than three times a week    Gets together:  More than three times a week    Attends religious service: More than 4 times per year    Active member of club or organization: Yes    Attends meetings of clubs or organizations: More than 4 times per year    Relationship status: Widowed  Other Topics Concern  . Not on file  Social History Narrative  . Not on file    Outpatient Encounter Medications as of 12/19/2018  Medication Sig  . amLODipine (NORVASC) 5 MG tablet TAKE 1 TABLET BY MOUTH EVERY DAY  . Calcium Carbonate-Vit D-Min (CALCIUM 1200) 1200-1000 MG-UNIT CHEW Chew 1,200 mg by mouth daily.    . cloNIDine (CATAPRES) 0.3 MG tablet TAKE ONE TABLET BY MOUTH AT BEDTIME  . Multiple Vitamins-Minerals (CENTRUM SILVER ULTRA WOMENS) TABS Take 1 tablet by mouth daily.   . pravastatin (PRAVACHOL) 10 MG tablet TAKE 1 TABLET BY  MOUTH DAILY *DOSE REDUCTION*  . triamterene-hydrochlorothiazide (MAXZIDE) 75-50 MG tablet TAKE 1 TABLET BY MOUTH EVERY DAY  . [DISCONTINUED] Aspirin (ADULT ASPIRIN LOW STRENGTH) 81 MG EC tablet Take 81 mg by mouth daily.     No facility-administered encounter medications on file as of 12/19/2018.     Activities of Daily Living In your present state of health, do you have any difficulty performing the following activities: 12/19/2018  Hearing? N  Vision? N  Difficulty concentrating or making decisions? N  Walking or climbing stairs? N  Dressing or bathing? N  Doing errands, shopping? N  Preparing Food and eating ? N  Using the Toilet? N  In the past six months, have you accidently leaked urine? N  Do you have problems with loss of bowel control? N  Managing your Medications? N  Managing your Finances? N  Housekeeping or managing your Housekeeping? N  Some recent data might be hidden    Patient Care Team: Fayrene Helper, MD as PCP - General Leta Baptist, MD as Consulting Physician (Otolaryngology) Florian Buff, MD as Consulting Physician (Obstetrics and Gynecology) Despina Hick, MD as Consulting Physician (Ophthalmology)    Assessment:   This is a routine wellness examination for [place].  Exercise Activities and Dietary recommendations Current Exercise Habits: Home exercise routine, Type of exercise: walking, Time (Minutes): 60, Frequency (Times/Week): 7, Weekly Exercise (Minutes/Week): 420, Intensity: Moderate, Exercise limited by: None identified  Goals    . Maintain current exercise program       Fall Risk Fall Risk  12/19/2018 07/11/2018 02/06/2018 12/31/2017 12/17/2017  Falls in the past year? 0 0 No No No  Number falls in past yr: - 0 - - -  Injury with Fall? 0 0 - - -   Is the patient's home free of loose throw rugs in walkways, pet beds, electrical cords, etc?   yes      Grab bars in the bathroom? yes      Handrails on the stairs?   no stairs       Adequate  lighting?   yes     Depression Screen PHQ 2/9 Scores 12/19/2018 07/11/2018 02/06/2018 12/31/2017  PHQ - 2 Score 0 0 0 0  PHQ- 9 Score - - - -     Cognitive Function     6CIT Screen 12/19/2018 12/17/2017 11/13/2016  What Year? 0 points 0 points 0 points  What month? 0 points 0 points 0 points  What time? 0 points 0 points 0 points  Count back from 20 0 points 0 points 0 points  Months in reverse  0 points 0 points 0 points  Repeat phrase 0 points 0 points 0 points  Total Score 0 0 0    Immunization History  Administered Date(s) Administered  . Influenza Split 02/15/2011, 02/08/2012  . Influenza Whole 05/10/2009, 03/16/2010  . Influenza,inj,Quad PF,6+ Mos 03/19/2013, 02/04/2014, 01/26/2015, 02/10/2016, 02/13/2017, 02/12/2018  . Pneumococcal Conjugate-13 12/26/2013  . Pneumococcal Polysaccharide-23 08/19/2013  . Tdap 09/27/2010  . Zoster 12/07/2008    Qualifies for Shingles Vaccine? Completed   Screening Tests Health Maintenance  Topic Date Due  . INFLUENZA VACCINE  12/28/2018  . MAMMOGRAM  06/21/2020  . TETANUS/TDAP  09/26/2020  . COLONOSCOPY  06/16/2026  . DEXA SCAN  Completed  . Hepatitis C Screening  Completed  . PNA vac Low Risk Adult  Completed    Cancer Screenings: Lung: Low Dose CT Chest recommended if Age 77-80 years, 30 pack-year currently smoking OR have quit w/in 15years. Patient does not qualify. Breast:  Up to date on Mammogram? Yes   Up to date of Bone Density/Dexa? Yes Colorectal: Due 2021   Additional Screenings:  Hepatitis C Screening: Completed      Plan:     1. Encounter for Medicare annual wellness exam   I have personally reviewed and noted the following in the patient's chart:   . Medical and social history . Use of alcohol, tobacco or illicit drugs  . Current medications and supplements . Functional ability and status . Nutritional status . Physical activity . Advanced directives . List of other physicians . Hospitalizations,  surgeries, and ER visits in previous 12 months . Vitals . Screenings to include cognitive, depression, and falls . Referrals and appointments  In addition, I have reviewed and discussed with patient certain preventive protocols, quality metrics, and best practice recommendations. A written personalized care plan for preventive services as well as general preventive health recommendations were provided to patient.     Perlie Mayo, NP  12/19/2018

## 2018-12-30 ENCOUNTER — Ambulatory Visit: Payer: Medicare Other

## 2018-12-30 ENCOUNTER — Ambulatory Visit (INDEPENDENT_AMBULATORY_CARE_PROVIDER_SITE_OTHER): Payer: Medicare Other | Admitting: Otolaryngology

## 2018-12-30 DIAGNOSIS — D440 Neoplasm of uncertain behavior of thyroid gland: Secondary | ICD-10-CM | POA: Diagnosis not present

## 2019-01-29 ENCOUNTER — Telehealth: Payer: Self-pay | Admitting: *Deleted

## 2019-01-29 NOTE — Telephone Encounter (Signed)
Pt called wanting to know if pneumonia vaccine was needed for her this year.

## 2019-01-29 NOTE — Telephone Encounter (Signed)
Left generic message requesting call back.  

## 2019-01-30 NOTE — Telephone Encounter (Signed)
Called patient. No answer. Left message requesting call back.

## 2019-01-30 NOTE — Telephone Encounter (Signed)
Spoke with patient and let her know that right now she isn't due for the pneumonia vaccine

## 2019-02-10 ENCOUNTER — Ambulatory Visit (INDEPENDENT_AMBULATORY_CARE_PROVIDER_SITE_OTHER): Payer: Medicare Other

## 2019-02-10 ENCOUNTER — Other Ambulatory Visit: Payer: Self-pay

## 2019-02-10 DIAGNOSIS — Z23 Encounter for immunization: Secondary | ICD-10-CM | POA: Diagnosis not present

## 2019-04-26 ENCOUNTER — Other Ambulatory Visit: Payer: Self-pay | Admitting: Family Medicine

## 2019-04-28 ENCOUNTER — Telehealth: Payer: Self-pay | Admitting: *Deleted

## 2019-04-28 NOTE — Telephone Encounter (Signed)
Which labs do you want her to have drawn?

## 2019-04-28 NOTE — Telephone Encounter (Signed)
Needs fasting cBc, lipid, cmp an d EGFR, , tSH and Vitd pls order

## 2019-04-28 NOTE — Telephone Encounter (Signed)
Pt has an upcoming appt 06-03-18 she needs updated lab orders sent to [facility]. She said she will go about the 3rd week in December to get it done

## 2019-04-29 ENCOUNTER — Other Ambulatory Visit: Payer: Self-pay

## 2019-04-29 DIAGNOSIS — E7849 Other hyperlipidemia: Secondary | ICD-10-CM

## 2019-04-29 DIAGNOSIS — E559 Vitamin D deficiency, unspecified: Secondary | ICD-10-CM

## 2019-04-29 DIAGNOSIS — I10 Essential (primary) hypertension: Secondary | ICD-10-CM

## 2019-04-29 DIAGNOSIS — E042 Nontoxic multinodular goiter: Secondary | ICD-10-CM

## 2019-04-29 NOTE — Telephone Encounter (Signed)
Fasting labs ordered per MD

## 2019-05-08 ENCOUNTER — Encounter: Payer: Self-pay | Admitting: Gastroenterology

## 2019-05-22 ENCOUNTER — Encounter: Payer: Self-pay | Admitting: Family Medicine

## 2019-05-22 LAB — COMPLETE METABOLIC PANEL WITH GFR
AG Ratio: 1.9 (calc) (ref 1.0–2.5)
ALT: 17 U/L (ref 6–29)
AST: 26 U/L (ref 10–35)
Albumin: 4.7 g/dL (ref 3.6–5.1)
Alkaline phosphatase (APISO): 76 U/L (ref 37–153)
BUN: 21 mg/dL (ref 7–25)
CO2: 27 mmol/L (ref 20–32)
Calcium: 10.1 mg/dL (ref 8.6–10.4)
Chloride: 102 mmol/L (ref 98–110)
Creat: 0.86 mg/dL (ref 0.60–0.93)
GFR, Est African American: 79 mL/min/{1.73_m2} (ref >=60)
GFR, Est Non African American: 68 mL/min/{1.73_m2} (ref >=60)
Globulin: 2.5 g/dL (calc) (ref 1.9–3.7)
Glucose, Bld: 99 mg/dL (ref 65–99)
Potassium: 3.9 mmol/L (ref 3.5–5.3)
Sodium: 140 mmol/L (ref 135–146)
Total Bilirubin: 0.7 mg/dL (ref 0.2–1.2)
Total Protein: 7.2 g/dL (ref 6.1–8.1)

## 2019-05-22 LAB — LIPID PANEL
Cholesterol: 145 mg/dL (ref <200)
HDL: 48 mg/dL — ABNORMAL LOW (ref >=50)
LDL Cholesterol (Calc): 80 mg/dL (calc)
Non-HDL Cholesterol (Calc): 97 mg/dL (calc) (ref <130)
Total CHOL/HDL Ratio: 3 (calc) (ref <5.0)
Triglycerides: 83 mg/dL (ref <150)

## 2019-05-22 LAB — CBC
HCT: 44.2 % (ref 35.0–45.0)
Hemoglobin: 15 g/dL (ref 11.7–15.5)
MCH: 29.3 pg (ref 27.0–33.0)
MCHC: 33.9 g/dL (ref 32.0–36.0)
MCV: 86.3 fL (ref 80.0–100.0)
MPV: 9.4 fL (ref 7.5–12.5)
Platelets: 370 10*3/uL (ref 140–400)
RBC: 5.12 10*6/uL — ABNORMAL HIGH (ref 3.80–5.10)
RDW: 13.4 % (ref 11.0–15.0)
WBC: 6.1 10*3/uL (ref 3.8–10.8)

## 2019-05-22 LAB — VITAMIN D 25 HYDROXY (VIT D DEFICIENCY, FRACTURES): Vit D, 25-Hydroxy: 31 ng/mL (ref 30–100)

## 2019-05-22 LAB — TSH: TSH: 1.72 mIU/L (ref 0.40–4.50)

## 2019-06-04 ENCOUNTER — Encounter: Payer: Self-pay | Admitting: Family Medicine

## 2019-06-04 ENCOUNTER — Other Ambulatory Visit: Payer: Self-pay

## 2019-06-04 ENCOUNTER — Telehealth (INDEPENDENT_AMBULATORY_CARE_PROVIDER_SITE_OTHER): Payer: Medicare PPO | Admitting: Family Medicine

## 2019-06-04 VITALS — BP 126/62 | Ht 68.0 in | Wt 152.0 lb

## 2019-06-04 DIAGNOSIS — I10 Essential (primary) hypertension: Secondary | ICD-10-CM

## 2019-06-04 DIAGNOSIS — E785 Hyperlipidemia, unspecified: Secondary | ICD-10-CM | POA: Diagnosis not present

## 2019-06-04 DIAGNOSIS — E7849 Other hyperlipidemia: Secondary | ICD-10-CM

## 2019-06-04 DIAGNOSIS — Z1231 Encounter for screening mammogram for malignant neoplasm of breast: Secondary | ICD-10-CM

## 2019-06-04 DIAGNOSIS — Z780 Asymptomatic menopausal state: Secondary | ICD-10-CM

## 2019-06-04 NOTE — Progress Notes (Signed)
Virtual Visit via Telephone Note  I connected with [name] on 06/04/19 at  8:00 AM EST by video, and verified that I am speaking with the correct person using two identifiers.  Location: Patient: home Provider:office Continuing good health habits of regular exercise and attention to    I discussed the limitations, risks, security and privacy concerns of performing an evaluation and management service by telephone and the availability of in person appointments. I also discussed with the patient that there may be a patient responsible charge related to this service. The patient expressed understanding and agreed to proceed.   History of Present Illness: F/u chronic problems, no complaints, continuing  Healthy habits with respect to food choice and exercise Denies recent fever or chills. Denies sinus pressure, nasal congestion, ear pain or sore throat. Denies chest congestion, productive cough or wheezing. Denies chest pains, palpitations and leg swelling Denies abdominal pain, nausea, vomiting,diarrhea or constipation.   Denies dysuria, frequency, hesitancy or incontinence. Denies joint pain, swelling and limitation in mobility. Denies headaches, seizures, numbness, or tingling. Denies depression, anxiety or insomnia. Denies skin break down or rash.    Checks bP daily and averages about 120/80   Observations/Objective: BP 126/62   Ht 5\' 8"  (1.727 m)   Wt 152 lb (68.9 kg)   BMI 23.11 kg/m  Good communication with no confusion and intact memory. Alert and oriented x 3 No signs of respiratory distress during speech    Assessment and Plan: Malignant hypertension Controlled, no change in medication DASH diet and commitment to daily physical activity for a minimum of 30 minutes discussed and encouraged, as a part of hypertension management. The importance of attaining a healthy weight is also discussed.  BP/Weight 06/04/2019 12/19/2018 07/11/2018 02/06/2018 01/11/2018  12/31/2017 123456  Systolic BP 123XX123 0000000 [id number] 0000000 [id number]  Diastolic BP 62 64 82 80 90 83 90  Wt. (Lbs) 152 152.12 154 153 149 154.04 153  BMI 23.11 23.13 23.42 23.26 22.66 23.42 23.26       Hyperlipidemia Hyperlipidemia:Low fat diet discussed and encouraged.   Lipid Panel  Lab Results  Component Value Date   CHOL 145 05/21/2019   HDL 48 (L) 05/21/2019   LDLCALC 80 05/21/2019   TRIG 83 05/21/2019   CHOLHDL 3.0 05/21/2019   Improved, no change in management     Follow Up Instructions:    I discussed the assessment and treatment plan with the patient. The patient was provided an opportunity to ask questions and all were answered. The patient agreed with the plan and demonstrated an understanding of the instructions.   The patient was advised to call back or seek an in-person evaluation if the symptoms worsen or if the condition fails to improve as anticipated.  I provided 15 minutes of non-face-to-face time during this encounter.   Tula Nakayama, MD

## 2019-06-04 NOTE — Assessment & Plan Note (Signed)
Hyperlipidemia:Low fat diet discussed and encouraged.   Lipid Panel  Lab Results  Component Value Date   CHOL 145 05/21/2019   HDL 48 (L) 05/21/2019   LDLCALC 80 05/21/2019   TRIG 83 05/21/2019   CHOLHDL 3.0 05/21/2019   Improved, no change in management

## 2019-06-04 NOTE — Patient Instructions (Addendum)
Keep wellness in July.  Please schedule annual physical exam with MD in early October, call if you need me sooner  Please schedule mammogram and bone density the same day and around the same time if possible, due Jan 25 or after.  Increase vit D 3 to 400 IU take TWO daily till done, continue calcium as before  New vit D 3 is 1000 IU one daily  You will receive new blood pressure card when you come in today  Call in august for fasting labs ( lipid , cmp and EGFR ) for your October visit plrease  Excellent labs. Continue healthy lifestyle  Best for 2021!  Thanks for choosing [facility], we consider it a privelige to serve you.

## 2019-06-04 NOTE — Assessment & Plan Note (Signed)
Controlled, no change in medication DASH diet and commitment to daily physical activity for a minimum of 30 minutes discussed and encouraged, as a part of hypertension management. The importance of attaining a healthy weight is also discussed.  BP/Weight 06/04/2019 12/19/2018 07/11/2018 02/06/2018 01/11/2018 12/31/2017 123456  Systolic BP 123XX123 0000000 123456 0000000 [id number]  Diastolic BP 62 64 82 80 90 83 90  Wt. (Lbs) 152 152.12 154 153 149 154.04 153  BMI 23.11 23.13 23.42 23.26 22.66 23.42 23.26

## 2019-06-06 DIAGNOSIS — H401131 Primary open-angle glaucoma, bilateral, mild stage: Secondary | ICD-10-CM | POA: Diagnosis not present

## 2019-06-06 DIAGNOSIS — H25811 Combined forms of age-related cataract, right eye: Secondary | ICD-10-CM | POA: Diagnosis not present

## 2019-06-09 ENCOUNTER — Other Ambulatory Visit: Payer: Self-pay

## 2019-06-09 MED ORDER — AMLODIPINE BESYLATE 5 MG PO TABS: 5.0000 mg | ORAL_TABLET | Freq: Every day | ORAL | 1 refills | Status: DC

## 2019-06-25 ENCOUNTER — Ambulatory Visit (HOSPITAL_COMMUNITY)
Admission: RE | Admit: 2019-06-25 | Discharge: 2019-06-25 | Disposition: A | Discharge status: Home / Self Care | Payer: Medicare PPO | Source: Ambulatory Visit | Attending: Family Medicine | Admitting: Family Medicine

## 2019-06-25 ENCOUNTER — Other Ambulatory Visit: Payer: Self-pay

## 2019-06-25 DIAGNOSIS — Z780 Asymptomatic menopausal state: Secondary | ICD-10-CM | POA: Insufficient documentation

## 2019-06-25 DIAGNOSIS — Z1231 Encounter for screening mammogram for malignant neoplasm of breast: Secondary | ICD-10-CM

## 2019-06-25 DIAGNOSIS — M85851 Other specified disorders of bone density and structure, right thigh: Secondary | ICD-10-CM | POA: Diagnosis not present

## 2019-06-26 ENCOUNTER — Other Ambulatory Visit: Payer: Self-pay | Admitting: Family Medicine

## 2019-06-26 MED ORDER — ALENDRONATE SODIUM 70 MG PO TABS: 70.0000 mg | ORAL_TABLET | ORAL | 11 refills | Status: DC

## 2019-06-26 NOTE — Progress Notes (Signed)
Fosamax

## 2019-07-23 ENCOUNTER — Other Ambulatory Visit: Payer: Self-pay | Admitting: Family Medicine

## 2019-07-25 DIAGNOSIS — H401131 Primary open-angle glaucoma, bilateral, mild stage: Secondary | ICD-10-CM | POA: Diagnosis not present

## 2019-07-25 DIAGNOSIS — H25811 Combined forms of age-related cataract, right eye: Secondary | ICD-10-CM | POA: Diagnosis not present

## 2019-09-08 ENCOUNTER — Other Ambulatory Visit: Payer: Self-pay

## 2019-09-08 ENCOUNTER — Ambulatory Visit (INDEPENDENT_AMBULATORY_CARE_PROVIDER_SITE_OTHER): Payer: Self-pay | Admitting: Gastroenterology

## 2019-09-08 DIAGNOSIS — Z86010 Personal history of colonic polyps: Secondary | ICD-10-CM

## 2019-09-08 MED ORDER — NA SULFATE-K SULFATE-MG SULF 17.5-3.13-1.6 GM/177ML PO SOLN: 1.0000 | Freq: Once | ORAL | 0 refills | Status: AC

## 2019-09-08 NOTE — Progress Notes (Addendum)
Gastroenterology Pre-Procedure Review  Request Date: 09/08/2019 Requesting Physician: 1 to 3 yr recall, Last TCS done by Dr. Oneida Alar on 06/16/2016, 5 simple adenomas/tubular adenoma  PATIENT REVIEW QUESTIONS: The patient responded to the following health history questions as indicated:    1. Diabetes Melitis: no 2. Joint replacements in the past 12 months: no 3. Major health problems in the past 3 months: no 4. Has an artificial valve or MVP: no 5. Has a defibrillator: no 6. Has been advised in past to take antibiotics in advance of a procedure like teeth cleaning: no 7. Family history of colon cancer: no  8. Alcohol Use: no 9. Illicit drug Use: no 10. History of sleep apnea: no  11. History of coronary artery or other vascular stents placed within the last 12 months: no 12. History of any prior anesthesia complications: no 13. There is no height or weight on file to calculate BMI. ht: 5'8 wt: 150 lbs    MEDICATIONS & ALLERGIES:    Patient reports the following regarding taking any blood thinners:   Plavix? no Aspirin? no Coumadin? no Brilinta? no Xarelto? no Eliquis? no Pradaxa? no Savaysa? no Effient? no  Patient confirms/reports the following medications:  Current Outpatient Medications  Medication Sig Dispense Refill  . alendronate (FOSAMAX) 70 MG tablet Take 1 tablet (70 mg total) by mouth every 7 (seven) days. Take with a full glass of water on an empty stomach. 4 tablet 11  . amLODipine (NORVASC) 5 MG tablet Take 1 tablet (5 mg total) by mouth daily. 90 tablet 1  . Calcium Carbonate-Vit D-Min (CALCIUM 1200) 1200-1000 MG-UNIT CHEW Chew 1,200 mg by mouth daily.      . Cholecalciferol (VITAMIN D3) 10 MCG (400 UNIT) CAPS Take 1 capsule by mouth daily.    . cloNIDine (CATAPRES) 0.3 MG tablet TAKE 1 TABLET BY MOUTH EVERYDAY AT BEDTIME 90 tablet 0  . Multiple Vitamins-Minerals (CENTRUM SILVER ULTRA WOMENS) TABS Take 1 tablet by mouth daily.     . pravastatin (PRAVACHOL) 10 MG  tablet TAKE 1 TABLET BY MOUTH DAILY *DOSE REDUCTION* 90 tablet 1  . triamterene-hydrochlorothiazide (MAXZIDE) 75-50 MG tablet TAKE 1 TABLET BY MOUTH EVERY DAY 90 tablet 1   No current facility-administered medications for this visit.    Patient confirms/reports the following allergies:  No Known Allergies  No orders of the defined types were placed in this encounter.   AUTHORIZATION INFORMATION Primary Insurance: [employer],  [place] #: [id number] Pre-Cert / Josem Kaufmann required: Yes, required per Caryl Pina, good from [id number] Pre-Cert / Auth #: [id number]  SCHEDULE INFORMATION: Procedure has been scheduled as follows:  Date: 10/22/2019, Time: 9:30 Location: [facility] with Dr. Gala Romney  This Gastroenterology Pre-Precedure Review Form is being routed to the following provider(s): Roseanne Kaufman, NP

## 2019-09-08 NOTE — Patient Instructions (Signed)
Wanda Ross  06-25-1950 MRN: [id number]     Procedure Date: 10/22/2019 Time to register: 8:30 AM Place to register: [facility] Procedure Time: 9:30 AM Scheduled provider: Dr. Gala Romney    PREPARATION FOR COLONOSCOPY WITH SUPREP BOWEL PREP KIT  Note: Suprep Bowel Prep Kit is a split-dose (2day) regimen. Consumption of BOTH 6-ounce bottles is required for a complete prep.  Please notify us immediately if you are diabetic, take iron supplements, or if you are on Coumadin or any other blood thinners.  Please hold the following medications: n/a                                                                                                                                                  2 DAYS BEFORE PROCEDURE:  DATE: 10/20/2019   DAY: Monday Begin clear liquid diet AFTER your lunch meal. NO SOLID FOODS after this point.  1 DAY BEFORE PROCEDURE:  DATE: 10/21/2019   DAY: Tuesday Continue clear liquids the entire day - NO SOLID FOOD.   Diabetic medications adjustments for today: n/a  At 6:00pm: Complete steps 1 through 4 below, using ONE (1) 6-ounce bottle, before going to bed. Step 1:  Pour ONE (1) 6-ounce bottle of SUPREP liquid into the mixing container.  Step 2:  Add cool drinking water to the 16 ounce line on the container and mix.  Note: Dilute the solution concentrate as directed prior to use. Step 3:  DRINK ALL the liquid in the container. Step 4:  You MUST drink an additional two (2) or more 16 ounce containers of water over the next one (1) hour.   Continue clear liquids.  DAY OF PROCEDURE:   DATE: 10/22/2019  DAY: Wednesday If you take medications for your heart, blood pressure, or breathing, you may take these medications.  Diabetic medications adjustments for today: n/a  5 hours before your procedure at 4:30 am: Step 1:  Pour ONE (1) 6-ounce bottle of SUPREP liquid into the mixing container.  Step 2:  Add cool drinking water to the 16 ounce line on the  container and mix.  Note: Dilute the solution concentrate as directed prior to use. Step 3:  DRINK ALL the liquid in the container. Step 4:  You MUST drink an additional two (2) or more 16 ounce containers of water over the next one (1) hour. You MUST complete the final glass of water at least 3 hours before your colonoscopy. Nothing by mouth past 6:30 am.    You may take your morning medications with sip of water unless we have instructed otherwise.    Please see below for Dietary Information.  CLEAR LIQUIDS INCLUDE:  Water Jello (NOT red in color)   Ice Popsicles (NOT red in color)   Tea (sugar ok, no milk/cream) Powdered fruit flavored drinks  Coffee (sugar ok,  no milk/cream) Gatorade/ Lemonade/ Kool-Aid  (NOT red in color)   Juice: apple, white grape, white cranberry Soft drinks  Clear bullion, consomme, broth (fat free beef/chicken/vegetable)  Carbonated beverages (any kind)  Strained chicken noodle soup Hard Candy   Remember: Clear liquids are liquids that will allow you to see your fingers on the other side of a clear glass. Be sure liquids are NOT red in color, and not cloudy, but CLEAR.  DO NOT EAT OR DRINK ANY OF THE FOLLOWING:  Dairy products of any kind   Cranberry juice Tomato juice / V8 juice   Grapefruit juice Orange juice     Red grape juice  Do not eat any solid foods, including such foods as: cereal, oatmeal, yogurt, fruits, vegetables, creamed soups, eggs, bread, crackers, pureed foods in a blender, etc.   HELPFUL HINTS FOR DRINKING PREP SOLUTION:   Make sure prep is extremely cold. Mix and refrigerate the the morning of the prep. You may also put in the freezer.   You may try mixing some Crystal Light or Country Time Lemonade if you prefer. Mix in small amounts; add more if necessary.  Try drinking through a straw  Rinse mouth with water or a mouthwash between glasses, to remove after-taste.  Try sipping on a cold beverage /ice/ popsicles between glasses of  prep.  Place a piece of sugar-free hard candy in mouth between glasses.  If you become nauseated, try consuming smaller amounts, or stretch out the time between glasses. Stop for 30-60 minutes, then slowly start back drinking.     OTHER INSTRUCTIONS  You will need a responsible adult at least 71 years of age to accompany you and drive you home. This person must remain in the waiting room during your procedure. The hospital will cancel your procedure if you do not have a responsible adult with you.   1. Wear loose fitting clothing that is easily removed. 2. Leave jewelry and other valuables at home.  3. Remove all body piercing jewelry and leave at home. 4. Total time from sign-in until discharge is approximately 2-3 hours. 5. You should go home directly after your procedure and rest. You can resume normal activities the day after your procedure. 6. The day of your procedure you should not:  Drive  Make legal decisions  Operate machinery  Drink alcohol  Return to work   You may call the office (Dept: [phone]) before 5:00pm, or page the doctor on call ([phone]) after 5:00pm, for further instructions, if necessary.   Insurance Information YOU WILL NEED TO CHECK WITH YOUR INSURANCE COMPANY FOR THE BENEFITS OF COVERAGE YOU HAVE FOR THIS PROCEDURE.  UNFORTUNATELY, NOT ALL INSURANCE COMPANIES HAVE BENEFITS TO COVER ALL OR PART OF THESE TYPES OF PROCEDURES.  IT IS YOUR RESPONSIBILITY TO CHECK YOUR BENEFITS, HOWEVER, WE WILL BE GLAD TO ASSIST YOU WITH ANY CODES YOUR INSURANCE COMPANY MAY NEED.    PLEASE NOTE THAT MOST INSURANCE COMPANIES WILL NOT COVER A SCREENING COLONOSCOPY FOR PEOPLE UNDER THE AGE OF 50  IF YOU HAVE BCBS INSURANCE, YOU MAY HAVE BENEFITS FOR A SCREENING COLONOSCOPY BUT IF POLYPS ARE FOUND THE DIAGNOSIS WILL CHANGE AND THEN YOU MAY HAVE A DEDUCTIBLE THAT WILL NEED TO BE MET. SO PLEASE MAKE SURE YOU CHECK YOUR BENEFITS FOR A SCREENING COLONOSCOPY AS WELL AS A  DIAGNOSTIC COLONOSCOPY.      

## 2019-09-15 NOTE — Progress Notes (Signed)
Appropriate.

## 2019-09-15 NOTE — Progress Notes (Signed)
Done

## 2019-10-20 ENCOUNTER — Other Ambulatory Visit: Payer: Self-pay

## 2019-10-20 ENCOUNTER — Other Ambulatory Visit (HOSPITAL_COMMUNITY)
Admission: RE | Admit: 2019-10-20 | Discharge: 2019-10-20 | Disposition: A | Discharge status: Home / Self Care | Payer: Medicare PPO | Source: Ambulatory Visit | Attending: Internal Medicine | Admitting: Internal Medicine

## 2019-10-20 DIAGNOSIS — Z01812 Encounter for preprocedural laboratory examination: Secondary | ICD-10-CM | POA: Insufficient documentation

## 2019-10-20 DIAGNOSIS — Z20822 Contact with and (suspected) exposure to covid-19: Secondary | ICD-10-CM | POA: Insufficient documentation

## 2019-10-20 LAB — SARS CORONAVIRUS 2 (TAT 6-24 HRS): SARS Coronavirus 2: NEGATIVE

## 2019-10-22 ENCOUNTER — Encounter (HOSPITAL_COMMUNITY): Payer: Self-pay | Admitting: Internal Medicine

## 2019-10-22 ENCOUNTER — Ambulatory Visit (HOSPITAL_COMMUNITY)
Admission: RE | Admit: 2019-10-22 | Discharge: 2019-10-22 | Disposition: A | Discharge status: Home / Self Care | Payer: Medicare PPO | Attending: Internal Medicine | Admitting: Internal Medicine

## 2019-10-22 ENCOUNTER — Other Ambulatory Visit: Payer: Self-pay

## 2019-10-22 ENCOUNTER — Encounter (HOSPITAL_COMMUNITY)
Admission: RE | Disposition: A | Discharge status: Home / Self Care | Payer: Self-pay | Source: Home / Self Care | Attending: Internal Medicine

## 2019-10-22 DIAGNOSIS — I10 Essential (primary) hypertension: Secondary | ICD-10-CM | POA: Insufficient documentation

## 2019-10-22 DIAGNOSIS — E785 Hyperlipidemia, unspecified: Secondary | ICD-10-CM | POA: Insufficient documentation

## 2019-10-22 DIAGNOSIS — K640 First degree hemorrhoids: Secondary | ICD-10-CM | POA: Diagnosis not present

## 2019-10-22 DIAGNOSIS — Z79899 Other long term (current) drug therapy: Secondary | ICD-10-CM | POA: Diagnosis not present

## 2019-10-22 DIAGNOSIS — Z86010 Personal history of colonic polyps: Secondary | ICD-10-CM | POA: Diagnosis not present

## 2019-10-22 DIAGNOSIS — Z09 Encounter for follow-up examination after completed treatment for conditions other than malignant neoplasm: Secondary | ICD-10-CM | POA: Insufficient documentation

## 2019-10-22 HISTORY — PX: COLONOSCOPY: SHX5424

## 2019-10-22 SURGERY — COLONOSCOPY
Anesthesia: Moderate Sedation

## 2019-10-22 MED ORDER — MEPERIDINE HCL 100 MG/ML IJ SOLN
INTRAMUSCULAR | Status: DC | PRN
  Administered 2019-10-22: 15 mg
  Administered 2019-10-22: 25 mg

## 2019-10-22 MED ORDER — MEPERIDINE HCL 50 MG/ML IJ SOLN
INTRAMUSCULAR | Status: AC
  Filled 2019-10-22: qty 1

## 2019-10-22 MED ORDER — SODIUM CHLORIDE 0.9 % IV SOLN
INTRAVENOUS | Status: DC
  Administered 2019-10-22: 1000 mL via INTRAVENOUS

## 2019-10-22 MED ORDER — ONDANSETRON HCL 4 MG/2ML IJ SOLN
INTRAMUSCULAR | Status: DC | PRN
  Administered 2019-10-22: 4 mg via INTRAVENOUS

## 2019-10-22 MED ORDER — MIDAZOLAM HCL 5 MG/5ML IJ SOLN
INTRAMUSCULAR | Status: AC
  Filled 2019-10-22: qty 10

## 2019-10-22 MED ORDER — MIDAZOLAM HCL 5 MG/5ML IJ SOLN
INTRAMUSCULAR | Status: DC | PRN
  Administered 2019-10-22: 1 mg via INTRAVENOUS
  Administered 2019-10-22: 2 mg via INTRAVENOUS
  Administered 2019-10-22: 1 mg via INTRAVENOUS

## 2019-10-22 MED ORDER — ONDANSETRON HCL 4 MG/2ML IJ SOLN
INTRAMUSCULAR | Status: AC
  Filled 2019-10-22: qty 2

## 2019-10-22 NOTE — H&P (Signed)
@LOGO @   Primary Care Physician:  Fayrene Helper, MD Primary Gastroenterologist:  Dr. Gala Romney  Pre-Procedure History & Physical: HPI:  Wanda Ross is a 69 y.o. female here for multiple adenomas removed from her colon 2018; here for surveillance examination.  No symptoms currently.  Past Medical History:  Diagnosis Date  . Glaucoma   . Hyperlipidemia 2010  . Hypertension 1990  . IGT (impaired glucose tolerance) 05/11/2010  . Multinodular goiter 2010   normal function    Past Surgical History:  Procedure Laterality Date  . COLONOSCOPY N/A 06/16/2016   Procedure: COLONOSCOPY;  Surgeon: Danie Binder, MD;  Location: [facility];  Service: Endoscopy;  Laterality: N/A;  9:30 AM  . EYE SURGERY  2017   3 on left eye 1 on right eye for glaucoma by Dr. Venetia Maxon   . EYE SURGERY  2018   Cataract Surgery by Dr. Arlina Robes     Prior to Admission medications   Medication Sig Start Date End Date Taking? Authorizing Provider  amLODipine (NORVASC) 5 MG tablet Take 1 tablet (5 mg total) by mouth daily. 06/09/19  Yes Fayrene Helper, MD  Calcium Carbonate-Vit D-Min (CALCIUM 1200) 1200-1000 MG-UNIT CHEW Chew 1,200 mg by mouth daily.     Yes [provider]  cholecalciferol (VITAMIN D) 25 MCG (1000 UNIT) tablet Take 1,000 mg by mouth daily.    Yes [provider]  cloNIDine (CATAPRES) 0.3 MG tablet TAKE 1 TABLET BY MOUTH EVERYDAY AT BEDTIME Patient taking differently: Take 0.3 mg by mouth at bedtime.  07/23/19  Yes Fayrene Helper, MD  dorzolamide (TRUSOPT) 2 % ophthalmic solution Place 1 drop into the right eye in the morning and at bedtime. Places in right eye daily.    Yes [provider]  Multiple Vitamins-Minerals (CENTRUM SILVER ULTRA WOMENS) TABS Take 1 tablet by mouth daily.    Yes [provider]  pravastatin (PRAVACHOL) 10 MG tablet TAKE 1 TABLET BY MOUTH DAILY *DOSE REDUCTION* Patient taking differently: Take 10 mg by mouth daily. TAKE 1  TABLET BY MOUTH DAILY *DOSE REDUCTION* 04/28/19  Yes Fayrene Helper, MD  triamterene-hydrochlorothiazide (MAXZIDE) 75-50 MG tablet TAKE 1 TABLET BY MOUTH EVERY DAY Patient taking differently: Take 0.5 tablets by mouth daily.  12/03/18  Yes Perlie Mayo, NP  alendronate (FOSAMAX) 70 MG tablet Take 1 tablet (70 mg total) by mouth every 7 (seven) days. Take with a full glass of water on an empty stomach. 06/26/19   Fayrene Helper, MD    Allergies as of 09/15/2019  . (No Known Allergies)    Family History  Problem Relation Age of Onset  . Dementia Father   . Dementia Mother   . Diabetes Mother   . Hypertension Mother   . Breast cancer Sister   . Hypertension Sister   . Diabetes Brother     Social History   Socioeconomic History  . Marital status: Widowed    Spouse name: Not on file  . Number of children: 3  . Years of education: Not on file  . Highest education level: Not on file  Occupational History  . Occupation: employed   Tobacco Use  . Smoking status: Never Smoker  . Smokeless tobacco: Never Used  Substance and Sexual Activity  . Alcohol use: No  . Drug use: No  . Sexual activity: Never    Birth control/protection: Post-menopausal  Other Topics Concern  . Not on file  Social History Narrative  .  Not on file   Social Determinants of Health   Financial Resource Strain:   . Difficulty of Paying Living Expenses:   Food Insecurity:   . Worried About Charity fundraiser in the Last Year:   . Arboriculturist in the Last Year:   Transportation Needs:   . Film/video editor (Medical):   Marland Kitchen Lack of Transportation (Non-Medical):   Physical Activity:   . Days of Exercise per Week:   . Minutes of Exercise per Session:   Stress:   . Feeling of Stress :   Social Connections:   . Frequency of Communication with Friends and Family:   . Frequency of Social Gatherings with Friends and Family:   . Attends Religious Services:   . Active Member of Clubs or  Organizations:   . Attends Archivist Meetings:   Marland Kitchen Marital Status:   Intimate Partner Violence:   . Fear of Current or Ex-Partner:   . Emotionally Abused:   Marland Kitchen Physically Abused:   . Sexually Abused:     Review of Systems: See HPI, otherwise negative ROS  Physical Exam: BP (!) 168/91   Pulse (!) 106   Temp 98.6 F (37 C) (Oral)   Resp 15   Ht 5\' 8"  (1.727 m)   Wt 68.9 kg   SpO2 99%   BMI 23.11 kg/m  General:   Alert,  Well-developed, well-nourished, pleasant and cooperative in NAD Neck:  Supple; no masses or thyromegaly. No significant cervical adenopathy. Lungs:  Clear throughout to auscultation.   No wheezes, crackles, or rhonchi. No acute distress. Heart:  Regular rate and rhythm; no murmurs, clicks, rubs,  or gallops. Abdomen: Non-distended, normal bowel sounds.  Soft and nontender without appreciable mass or hepatosplenomegaly.  Pulses:  Normal pulses noted. Extremities:  Without clubbing or edema.  Impression/Plan: 69 year old lady here for surveillance colonoscopy.  History of multiple colonic adenomas removed 3 years ago.  The risks, benefits, limitations, alternatives and imponderables have been reviewed with the patient. Questions have been answered. All parties are agreeable.      Notice: This dictation was prepared with Dragon dictation along with smaller phrase technology. Any transcriptional errors that result from this process are unintentional and may not be corrected upon review.

## 2019-10-22 NOTE — Discharge Instructions (Signed)
  Colonoscopy Discharge Instructions  Read the instructions outlined below and refer to this sheet in the next few weeks. These discharge instructions provide you with general information on caring for yourself after you leave the hospital. Your doctor may also give you specific instructions. While your treatment has been planned according to the most current medical practices available, unavoidable complications occasionally occur. If you have any problems or questions after discharge, call Dr. Gala Romney at [phone]. ACTIVITY  You may resume your regular activity, but move at a slower pace for the next 24 hours.   Take frequent rest periods for the next 24 hours.   Walking will help get rid of the air and reduce the bloated feeling in your belly (abdomen).   No driving for 24 hours (because of the medicine (anesthesia) used during the test).    Do not sign any important legal documents or operate any machinery for 24 hours (because of the anesthesia used during the test).  NUTRITION  Drink plenty of fluids.   You may resume your normal diet as instructed by your doctor.   Begin with a light meal and progress to your normal diet. Heavy or fried foods are harder to digest and may make you feel sick to your stomach (nauseated).   Avoid alcoholic beverages for 24 hours or as instructed.  MEDICATIONS  You may resume your normal medications unless your doctor tells you otherwise.  WHAT YOU CAN EXPECT TODAY  Some feelings of bloating in the abdomen.   Passage of more gas than usual.   Spotting of blood in your stool or on the toilet paper.  IF YOU HAD POLYPS REMOVED DURING THE COLONOSCOPY:  No aspirin products for 7 days or as instructed.   No alcohol for 7 days or as instructed.   Eat a soft diet for the next 24 hours.  FINDING OUT THE RESULTS OF YOUR TEST Not all test results are available during your visit. If your test results are not back during the visit, make an appointment  with your caregiver to find out the results. Do not assume everything is normal if you have not heard from your caregiver or the medical facility. It is important for you to follow up on all of your test results.  SEEK IMMEDIATE MEDICAL ATTENTION IF:  You have more than a spotting of blood in your stool.   Your belly is swollen (abdominal distention).   You are nauseated or vomiting.   You have a temperature over 101.   You have abdominal pain or discomfort that is severe or gets worse throughout the day.   No polyps found today.  Your medication list appears to be an accurate.  Cholecalciferol dosing needs to be confirmed by prescribing physician.  Would recommend 1 more colonoscopy for surveillance purposes in 5 years only if your overall health permits  At patient request, I called [name] at [phone] -discussed results and recommendations

## 2019-10-22 NOTE — Op Note (Signed)
[facility] Patient Name: Wanda Ross Procedure Date: 10/22/2019 9:28 AM MRN: [id number] Date of Birth: Feb 17, 1951 Attending MD: Norvel Richards , MD CSN: [id number] Age: 68 Admit Type: Outpatient Procedure:                Colonoscopy Indications:              High risk colon cancer surveillance: Personal                            history of colonic polyps Providers:                Norvel Richards, MD, Janeece Riggers, RN, Raphael Gibney, Technician Referring MD:              Medicines:                Midazolam 4 mg IV, Meperidine 40 mg IV Complications:            No immediate complications. Estimated Blood Loss:     Estimated blood loss: none. Procedure:                Pre-Anesthesia Assessment:                           - Prior to the procedure, a History and Physical                            was performed, and patient medications and                            allergies were reviewed. The patient's tolerance of                            previous anesthesia was also reviewed. The risks                            and benefits of the procedure and the sedation                            options and risks were discussed with the patient.                            All questions were answered, and informed consent                            was obtained. Prior Anticoagulants: The patient has                            taken no previous anticoagulant or antiplatelet                            agents. ASA Grade Assessment: II - A patient with  mild systemic disease. After reviewing the risks                            and benefits, the patient was deemed in                            satisfactory condition to undergo the procedure.                           After obtaining informed consent, the colonoscope                            was passed under direct vision. Throughout the                            procedure,  the patient's blood pressure, pulse, and                            oxygen saturations were monitored continuously. The                            [id number]) scope was introduced through                            the anus and advanced to the the cecum, identified                            by appendiceal orifice and ileocecal valve. The                            colonoscopy was performed without difficulty. The                            patient tolerated the procedure well. The quality                            of the bowel preparation was adequate. Scope In: 9:42:24 AM Scope Out: 9:54:10 AM Scope Withdrawal Time: 0 hours 8 minutes 14 seconds  Total Procedure Duration: 0 hours 11 minutes 46 seconds  Findings:      The colon (entire examined portion) appeared normal.      Non-bleeding internal hemorrhoids were found during retroflexion. The       hemorrhoids were mild, small and Grade I (internal hemorrhoids that do       not prolapse). Impression:               - The entire examined colon is normal.                           - Non-bleeding internal hemorrhoids.                           - No specimens collected. Moderate Sedation:      Moderate (conscious) sedation was administered by the endoscopy nurse       and supervised by the endoscopist. The  following parameters were       monitored: oxygen saturation, heart rate, blood pressure, respiratory       rate, EKG, adequacy of pulmonary ventilation, and response to care.       Total physician intraservice time was 18 minutes. Recommendation:           - Repeat colonoscopy in 5 years for surveillance                            (only if overall health permits.                           - Procedure Code(s):        --- Professional ---                           [phone], Colonoscopy, flexible; diagnostic, including                            collection of specimen(s) by brushing or washing,                            when  performed (separate procedure)                           G0500, Moderate sedation services provided by the                            same physician or other qualified health care                            professional performing a gastrointestinal                            endoscopic service that sedation supports,                            requiring the presence of an independent trained                            observer to assist in the monitoring of the                            patient's level of consciousness and physiological                            status; initial 15 minutes of intra-service time;                            patient age 89 years or older (additional time may                            be reported with [phone], as appropriate) Diagnosis Code(s):        --- Professional ---  Z86.010, Personal history of colonic polyps                           K64.0, First degree hemorrhoids CPT copyright 2019 American Medical Association. All rights reserved. The codes documented in this report are preliminary and upon coder review may  be revised to meet current compliance requirements. Wanda Ross. Wanda Behler, MD Norvel Richards, MD 10/22/2019 10:05:40 AM This report has been signed electronically. Number of Addenda: 0

## 2019-11-21 ENCOUNTER — Encounter (HOSPITAL_COMMUNITY): Payer: Self-pay | Admitting: *Deleted

## 2019-11-21 ENCOUNTER — Other Ambulatory Visit: Payer: Self-pay

## 2019-11-21 ENCOUNTER — Emergency Department (HOSPITAL_COMMUNITY): Payer: Medicare PPO

## 2019-11-21 ENCOUNTER — Inpatient Hospital Stay (HOSPITAL_COMMUNITY)
Admit: 2019-11-21 | Discharge: 2019-12-02 | DRG: 025 | Disposition: A | Discharge status: Rehabilitation Facility | Payer: Medicare PPO | Attending: Internal Medicine | Admitting: Internal Medicine

## 2019-11-21 DIAGNOSIS — D496 Neoplasm of unspecified behavior of brain: Secondary | ICD-10-CM | POA: Diagnosis not present

## 2019-11-21 DIAGNOSIS — E042 Nontoxic multinodular goiter: Secondary | ICD-10-CM | POA: Diagnosis not present

## 2019-11-21 DIAGNOSIS — E876 Hypokalemia: Secondary | ICD-10-CM | POA: Diagnosis present

## 2019-11-21 DIAGNOSIS — T380X5A Adverse effect of glucocorticoids and synthetic analogues, initial encounter: Secondary | ICD-10-CM | POA: Diagnosis not present

## 2019-11-21 DIAGNOSIS — R531 Weakness: Secondary | ICD-10-CM

## 2019-11-21 DIAGNOSIS — N390 Urinary tract infection, site not specified: Secondary | ICD-10-CM

## 2019-11-21 DIAGNOSIS — I10 Essential (primary) hypertension: Secondary | ICD-10-CM | POA: Diagnosis not present

## 2019-11-21 DIAGNOSIS — E785 Hyperlipidemia, unspecified: Secondary | ICD-10-CM | POA: Diagnosis present

## 2019-11-21 DIAGNOSIS — R739 Hyperglycemia, unspecified: Secondary | ICD-10-CM | POA: Diagnosis present

## 2019-11-21 DIAGNOSIS — R7303 Prediabetes: Secondary | ICD-10-CM | POA: Diagnosis not present

## 2019-11-21 DIAGNOSIS — D72829 Elevated white blood cell count, unspecified: Secondary | ICD-10-CM

## 2019-11-21 DIAGNOSIS — Z79899 Other long term (current) drug therapy: Secondary | ICD-10-CM | POA: Diagnosis not present

## 2019-11-21 DIAGNOSIS — G8929 Other chronic pain: Secondary | ICD-10-CM | POA: Diagnosis present

## 2019-11-21 DIAGNOSIS — C710 Malignant neoplasm of cerebrum, except lobes and ventricles: Secondary | ICD-10-CM | POA: Diagnosis present

## 2019-11-21 DIAGNOSIS — R220 Localized swelling, mass and lump, head: Secondary | ICD-10-CM | POA: Diagnosis not present

## 2019-11-21 DIAGNOSIS — Z298 Encounter for other specified prophylactic measures: Secondary | ICD-10-CM | POA: Diagnosis not present

## 2019-11-21 DIAGNOSIS — H409 Unspecified glaucoma: Secondary | ICD-10-CM | POA: Diagnosis present

## 2019-11-21 DIAGNOSIS — R414 Neurologic neglect syndrome: Secondary | ICD-10-CM | POA: Diagnosis present

## 2019-11-21 DIAGNOSIS — Z48811 Encounter for surgical aftercare following surgery on the nervous system: Secondary | ICD-10-CM | POA: Diagnosis not present

## 2019-11-21 DIAGNOSIS — D72828 Other elevated white blood cell count: Secondary | ICD-10-CM | POA: Diagnosis not present

## 2019-11-21 DIAGNOSIS — C718 Malignant neoplasm of overlapping sites of brain: Secondary | ICD-10-CM | POA: Diagnosis not present

## 2019-11-21 DIAGNOSIS — M5431 Sciatica, right side: Secondary | ICD-10-CM | POA: Diagnosis present

## 2019-11-21 DIAGNOSIS — R269 Unspecified abnormalities of gait and mobility: Secondary | ICD-10-CM | POA: Diagnosis not present

## 2019-11-21 DIAGNOSIS — C719 Malignant neoplasm of brain, unspecified: Secondary | ICD-10-CM | POA: Diagnosis not present

## 2019-11-21 DIAGNOSIS — R4182 Altered mental status, unspecified: Secondary | ICD-10-CM | POA: Diagnosis not present

## 2019-11-21 DIAGNOSIS — G9389 Other specified disorders of brain: Secondary | ICD-10-CM

## 2019-11-21 DIAGNOSIS — J984 Other disorders of lung: Secondary | ICD-10-CM | POA: Diagnosis not present

## 2019-11-21 DIAGNOSIS — Z7983 Long term (current) use of bisphosphonates: Secondary | ICD-10-CM

## 2019-11-21 DIAGNOSIS — Z20822 Contact with and (suspected) exposure to covid-19: Secondary | ICD-10-CM | POA: Diagnosis present

## 2019-11-21 DIAGNOSIS — E46 Unspecified protein-calorie malnutrition: Secondary | ICD-10-CM | POA: Diagnosis not present

## 2019-11-21 DIAGNOSIS — K449 Diaphragmatic hernia without obstruction or gangrene: Secondary | ICD-10-CM | POA: Diagnosis not present

## 2019-11-21 DIAGNOSIS — R7401 Elevation of levels of liver transaminase levels: Secondary | ICD-10-CM | POA: Diagnosis not present

## 2019-11-21 DIAGNOSIS — E8809 Other disorders of plasma-protein metabolism, not elsewhere classified: Secondary | ICD-10-CM | POA: Diagnosis not present

## 2019-11-21 DIAGNOSIS — G936 Cerebral edema: Secondary | ICD-10-CM | POA: Diagnosis present

## 2019-11-21 DIAGNOSIS — R42 Dizziness and giddiness: Secondary | ICD-10-CM | POA: Diagnosis not present

## 2019-11-21 DIAGNOSIS — G9340 Encephalopathy, unspecified: Secondary | ICD-10-CM | POA: Diagnosis not present

## 2019-11-21 DIAGNOSIS — E440 Moderate protein-calorie malnutrition: Secondary | ICD-10-CM | POA: Diagnosis not present

## 2019-11-21 DIAGNOSIS — G939 Disorder of brain, unspecified: Secondary | ICD-10-CM | POA: Diagnosis not present

## 2019-11-21 DIAGNOSIS — Z9889 Other specified postprocedural states: Secondary | ICD-10-CM | POA: Diagnosis not present

## 2019-11-21 DIAGNOSIS — N281 Cyst of kidney, acquired: Secondary | ICD-10-CM | POA: Diagnosis not present

## 2019-11-21 LAB — CBC WITH DIFFERENTIAL/PLATELET
Abs Immature Granulocytes: 0.01 10*3/uL (ref 0.00–0.07)
Basophils Absolute: 0 10*3/uL (ref 0.0–0.1)
Basophils Relative: 1 %
Eosinophils Absolute: 0.1 10*3/uL (ref 0.0–0.5)
Eosinophils Relative: 2 %
HCT: 43.5 % (ref 36.0–46.0)
Hemoglobin: 14.8 g/dL (ref 12.0–15.0)
Immature Granulocytes: 0 %
Lymphocytes Relative: 17 %
Lymphs Abs: 1.2 10*3/uL (ref 0.7–4.0)
MCH: 28.8 pg (ref 26.0–34.0)
MCHC: 34 g/dL (ref 30.0–36.0)
MCV: 84.8 fL (ref 80.0–100.0)
Monocytes Absolute: 0.6 10*3/uL (ref 0.1–1.0)
Monocytes Relative: 9 %
Neutro Abs: 5.1 10*3/uL (ref 1.7–7.7)
Neutrophils Relative %: 71 %
Platelets: 356 10*3/uL (ref 150–400)
RBC: 5.13 MIL/uL — ABNORMAL HIGH (ref 3.87–5.11)
RDW: 13.2 % (ref 11.5–15.5)
WBC: 7.1 10*3/uL (ref 4.0–10.5)
nRBC: 0 % (ref 0.0–0.2)

## 2019-11-21 LAB — URINALYSIS, ROUTINE W REFLEX MICROSCOPIC
Bilirubin Urine: NEGATIVE
Glucose, UA: NEGATIVE mg/dL
Ketones, ur: NEGATIVE mg/dL
Nitrite: NEGATIVE
Protein, ur: NEGATIVE mg/dL
Specific Gravity, Urine: 1.016 (ref 1.005–1.030)
pH: 5 (ref 5.0–8.0)

## 2019-11-21 LAB — COMPREHENSIVE METABOLIC PANEL
ALT: 35 U/L (ref 0–44)
AST: 37 U/L (ref 15–41)
Albumin: 4.3 g/dL (ref 3.5–5.0)
Alkaline Phosphatase: 52 U/L (ref 38–126)
Anion gap: 10 (ref 5–15)
BUN: 12 mg/dL (ref 8–23)
CO2: 27 mmol/L (ref 22–32)
Calcium: 8.8 mg/dL — ABNORMAL LOW (ref 8.9–10.3)
Chloride: 99 mmol/L (ref 98–111)
Creatinine, Ser: 0.83 mg/dL (ref 0.44–1.00)
GFR calc Af Amer: >60 mL/min (ref >60)
GFR calc non Af Amer: >60 mL/min (ref >60)
Glucose, Bld: 155 mg/dL — ABNORMAL HIGH (ref 70–99)
Potassium: 2.8 mmol/L — ABNORMAL LOW (ref 3.5–5.1)
Sodium: 136 mmol/L (ref 135–145)
Total Bilirubin: 0.4 mg/dL (ref 0.3–1.2)
Total Protein: 7.2 g/dL (ref 6.5–8.1)

## 2019-11-21 MED ORDER — CEPHALEXIN 500 MG PO CAPS
500.0000 mg | ORAL_CAPSULE | Freq: Once | ORAL | Status: AC
  Administered 2019-11-22: 500 mg via ORAL
  Filled 2019-11-21: qty 1

## 2019-11-21 MED ORDER — POTASSIUM CHLORIDE CRYS ER 20 MEQ PO TBCR
40.0000 meq | EXTENDED_RELEASE_TABLET | Freq: Once | ORAL | Status: AC
  Administered 2019-11-22: 40 meq via ORAL
  Filled 2019-11-21: qty 2

## 2019-11-21 NOTE — ED Triage Notes (Signed)
Family states that they are concerned that pt has been stumbling at home and "not acting like herself" for the past 4 days, pt alert, able to answer questions in triage,

## 2019-11-22 ENCOUNTER — Encounter (HOSPITAL_COMMUNITY): Payer: Self-pay | Admitting: Family Medicine

## 2019-11-22 ENCOUNTER — Inpatient Hospital Stay (HOSPITAL_COMMUNITY): Payer: Medicare PPO

## 2019-11-22 DIAGNOSIS — Z298 Encounter for other specified prophylactic measures: Secondary | ICD-10-CM | POA: Diagnosis not present

## 2019-11-22 DIAGNOSIS — Z7983 Long term (current) use of bisphosphonates: Secondary | ICD-10-CM | POA: Diagnosis not present

## 2019-11-22 DIAGNOSIS — E785 Hyperlipidemia, unspecified: Secondary | ICD-10-CM | POA: Diagnosis present

## 2019-11-22 DIAGNOSIS — E876 Hypokalemia: Secondary | ICD-10-CM

## 2019-11-22 DIAGNOSIS — H409 Unspecified glaucoma: Secondary | ICD-10-CM | POA: Diagnosis present

## 2019-11-22 DIAGNOSIS — E46 Unspecified protein-calorie malnutrition: Secondary | ICD-10-CM | POA: Diagnosis not present

## 2019-11-22 DIAGNOSIS — N390 Urinary tract infection, site not specified: Secondary | ICD-10-CM

## 2019-11-22 DIAGNOSIS — D72829 Elevated white blood cell count, unspecified: Secondary | ICD-10-CM | POA: Diagnosis not present

## 2019-11-22 DIAGNOSIS — T380X5A Adverse effect of glucocorticoids and synthetic analogues, initial encounter: Secondary | ICD-10-CM | POA: Diagnosis present

## 2019-11-22 DIAGNOSIS — Z20822 Contact with and (suspected) exposure to covid-19: Secondary | ICD-10-CM | POA: Diagnosis present

## 2019-11-22 DIAGNOSIS — R7303 Prediabetes: Secondary | ICD-10-CM | POA: Diagnosis not present

## 2019-11-22 DIAGNOSIS — M5431 Sciatica, right side: Secondary | ICD-10-CM | POA: Diagnosis present

## 2019-11-22 DIAGNOSIS — R7401 Elevation of levels of liver transaminase levels: Secondary | ICD-10-CM | POA: Diagnosis not present

## 2019-11-22 DIAGNOSIS — D72828 Other elevated white blood cell count: Secondary | ICD-10-CM | POA: Diagnosis not present

## 2019-11-22 DIAGNOSIS — I10 Essential (primary) hypertension: Secondary | ICD-10-CM | POA: Diagnosis present

## 2019-11-22 DIAGNOSIS — D496 Neoplasm of unspecified behavior of brain: Secondary | ICD-10-CM | POA: Diagnosis not present

## 2019-11-22 DIAGNOSIS — R739 Hyperglycemia, unspecified: Secondary | ICD-10-CM | POA: Diagnosis present

## 2019-11-22 DIAGNOSIS — G9389 Other specified disorders of brain: Secondary | ICD-10-CM | POA: Diagnosis not present

## 2019-11-22 DIAGNOSIS — Z79899 Other long term (current) drug therapy: Secondary | ICD-10-CM | POA: Diagnosis not present

## 2019-11-22 DIAGNOSIS — C719 Malignant neoplasm of brain, unspecified: Secondary | ICD-10-CM | POA: Diagnosis not present

## 2019-11-22 DIAGNOSIS — R531 Weakness: Secondary | ICD-10-CM | POA: Diagnosis not present

## 2019-11-22 DIAGNOSIS — G8929 Other chronic pain: Secondary | ICD-10-CM | POA: Diagnosis present

## 2019-11-22 DIAGNOSIS — R414 Neurologic neglect syndrome: Secondary | ICD-10-CM | POA: Diagnosis present

## 2019-11-22 DIAGNOSIS — G936 Cerebral edema: Secondary | ICD-10-CM | POA: Diagnosis present

## 2019-11-22 DIAGNOSIS — C710 Malignant neoplasm of cerebrum, except lobes and ventricles: Secondary | ICD-10-CM | POA: Diagnosis present

## 2019-11-22 DIAGNOSIS — E8809 Other disorders of plasma-protein metabolism, not elsewhere classified: Secondary | ICD-10-CM | POA: Diagnosis not present

## 2019-11-22 LAB — CBC
HCT: 43.8 % (ref 36.0–46.0)
Hemoglobin: 14.8 g/dL (ref 12.0–15.0)
MCH: 28.6 pg (ref 26.0–34.0)
MCHC: 33.8 g/dL (ref 30.0–36.0)
MCV: 84.6 fL (ref 80.0–100.0)
Platelets: 377 10*3/uL (ref 150–400)
RBC: 5.18 MIL/uL — ABNORMAL HIGH (ref 3.87–5.11)
RDW: 13.2 % (ref 11.5–15.5)
WBC: 6.3 10*3/uL (ref 4.0–10.5)
nRBC: 0 % (ref 0.0–0.2)

## 2019-11-22 LAB — COMPREHENSIVE METABOLIC PANEL
ALT: 34 U/L (ref 0–44)
AST: 32 U/L (ref 15–41)
Albumin: 4.4 g/dL (ref 3.5–5.0)
Alkaline Phosphatase: 53 U/L (ref 38–126)
Anion gap: 11 (ref 5–15)
BUN: 10 mg/dL (ref 8–23)
CO2: 25 mmol/L (ref 22–32)
Calcium: 9 mg/dL (ref 8.9–10.3)
Chloride: 104 mmol/L (ref 98–111)
Creatinine, Ser: 0.65 mg/dL (ref 0.44–1.00)
GFR calc Af Amer: >60 mL/min (ref >60)
GFR calc non Af Amer: >60 mL/min (ref >60)
Glucose, Bld: 138 mg/dL — ABNORMAL HIGH (ref 70–99)
Potassium: 4 mmol/L (ref 3.5–5.1)
Sodium: 140 mmol/L (ref 135–145)
Total Bilirubin: 0.7 mg/dL (ref 0.3–1.2)
Total Protein: 7.4 g/dL (ref 6.5–8.1)

## 2019-11-22 LAB — SARS CORONAVIRUS 2 BY RT PCR (HOSPITAL ORDER, PERFORMED IN ~~LOC~~ HOSPITAL LAB): SARS Coronavirus 2: NEGATIVE

## 2019-11-22 LAB — MAGNESIUM: Magnesium: 1.9 mg/dL (ref 1.7–2.4)

## 2019-11-22 MED ORDER — ACETAMINOPHEN 650 MG RE SUPP: 650.0000 mg | Freq: Four times a day (QID) | RECTAL | Status: DC | PRN

## 2019-11-22 MED ORDER — GADOBUTROL 1 MMOL/ML IV SOLN
7.0000 mL | Freq: Once | INTRAVENOUS | Status: AC | PRN
  Administered 2019-11-22: 7 mL via INTRAVENOUS

## 2019-11-22 MED ORDER — ACETAMINOPHEN 325 MG PO TABS: 650.0000 mg | ORAL_TABLET | Freq: Four times a day (QID) | ORAL | Status: DC | PRN

## 2019-11-22 MED ORDER — DEXAMETHASONE SODIUM PHOSPHATE 10 MG/ML IJ SOLN
10.0000 mg | Freq: Once | INTRAMUSCULAR | Status: AC
  Administered 2019-11-22: 10 mg via INTRAVENOUS
  Filled 2019-11-22: qty 1

## 2019-11-22 MED ORDER — ONDANSETRON HCL 4 MG PO TABS: 4.0000 mg | ORAL_TABLET | Freq: Four times a day (QID) | ORAL | Status: DC | PRN

## 2019-11-22 MED ORDER — DORZOLAMIDE HCL 2 % OP SOLN
1.0000 [drp] | Freq: Every day | OPHTHALMIC | Status: DC
  Filled 2019-11-22: qty 10

## 2019-11-22 MED ORDER — CLONIDINE HCL 0.1 MG PO TABS
0.3000 mg | ORAL_TABLET | Freq: Every day | ORAL | Status: DC
  Administered 2019-11-23 – 2019-12-01 (×10): 0.3 mg via ORAL
  Filled 2019-11-22: qty 1
  Filled 2019-11-22 (×2): qty 3
  Filled 2019-11-22: qty 1
  Filled 2019-11-22 (×6): qty 3

## 2019-11-22 MED ORDER — SODIUM CHLORIDE 0.9 % IV SOLN: 250.0000 mL | INTRAVENOUS | Status: DC | PRN

## 2019-11-22 MED ORDER — POTASSIUM CHLORIDE 10 MEQ/100ML IV SOLN
10.0000 meq | INTRAVENOUS | Status: AC
  Administered 2019-11-22 (×3): 10 meq via INTRAVENOUS
  Filled 2019-11-22 (×3): qty 100

## 2019-11-22 MED ORDER — AMLODIPINE BESYLATE 5 MG PO TABS
5.0000 mg | ORAL_TABLET | Freq: Every day | ORAL | Status: DC
  Administered 2019-11-22 – 2019-12-02 (×10): 5 mg via ORAL
  Filled 2019-11-22 (×10): qty 1

## 2019-11-22 MED ORDER — LORAZEPAM 2 MG/ML IJ SOLN
0.5000 mg | Freq: Once | INTRAMUSCULAR | Status: AC
  Administered 2019-11-22: 0.5 mg via INTRAVENOUS
  Filled 2019-11-22: qty 1

## 2019-11-22 MED ORDER — PRAVASTATIN SODIUM 10 MG PO TABS
10.0000 mg | ORAL_TABLET | Freq: Every day | ORAL | Status: DC
  Administered 2019-11-22 – 2019-12-01 (×10): 10 mg via ORAL
  Filled 2019-11-22 (×12): qty 1

## 2019-11-22 MED ORDER — SODIUM CHLORIDE 0.9 % IV SOLN: 1.0000 g | INTRAVENOUS | Status: DC

## 2019-11-22 MED ORDER — SODIUM CHLORIDE 0.9% FLUSH: 3.0000 mL | INTRAVENOUS | Status: DC | PRN

## 2019-11-22 MED ORDER — SODIUM CHLORIDE 0.9% FLUSH
3.0000 mL | Freq: Two times a day (BID) | INTRAVENOUS | Status: DC
  Administered 2019-11-22 – 2019-11-24 (×6): 3 mL via INTRAVENOUS

## 2019-11-22 MED ORDER — ONDANSETRON HCL 4 MG/2ML IJ SOLN
4.0000 mg | Freq: Four times a day (QID) | INTRAMUSCULAR | Status: DC | PRN
  Administered 2019-11-26: 4 mg via INTRAVENOUS

## 2019-11-22 MED ORDER — DEXAMETHASONE 4 MG PO TABS
4.0000 mg | ORAL_TABLET | Freq: Two times a day (BID) | ORAL | Status: DC
  Administered 2019-11-23 – 2019-11-25 (×7): 4 mg via ORAL
  Filled 2019-11-22 (×7): qty 1

## 2019-11-22 MED ORDER — DEXAMETHASONE 4 MG PO TABS
6.0000 mg | ORAL_TABLET | Freq: Four times a day (QID) | ORAL | Status: DC
  Administered 2019-11-22 (×3): 6 mg via ORAL
  Filled 2019-11-22 (×2): qty 2
  Filled 2019-11-22: qty 1

## 2019-11-22 MED ORDER — DORZOLAMIDE HCL-TIMOLOL MAL 2-0.5 % OP SOLN
1.0000 [drp] | Freq: Two times a day (BID) | OPHTHALMIC | Status: DC
  Administered 2019-11-22 – 2019-12-02 (×20): 1 [drp] via OPHTHALMIC
  Filled 2019-11-22 (×3): qty 10

## 2019-11-22 MED ORDER — SODIUM CHLORIDE 0.9 % IV SOLN
1.0000 g | INTRAVENOUS | Status: DC
  Administered 2019-11-22 – 2019-11-23 (×2): 1 g via INTRAVENOUS
  Filled 2019-11-22 (×2): qty 10

## 2019-11-22 NOTE — Consult Note (Signed)
Reason for Consult: Left thalamic brain mass Referring Physician: Nicolette Bang  Wanda Ross is an 69 y.o. female.  HPI: Wanda Ross is a 69 year old strongly left-handed individual who notes that her family was concerned that she was not behaving appropriately in the last weeks and brought her to the emergency department.  They note that she is not keeping her house is neat and tidy as she normally does and that she is becoming less spontaneous in her speech offering only brief answers to questions and not initiating conversation like she normally does.  For her part the patient was not particularly aware of any problems but when questioned she notes that she was having difficulty with some word finding and enunciating certain words.  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  This may have been going on for several weeks time.  CT scan of the brain was performed yesterday which demonstrates the presence of a left parietal lesion in the region of the thalamus.  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  She is been transferred to [facility] now for further work-up including a metastatic work-up and an MRI of the brain with and without contrast.  She has been started on Decadron and since starting the Decadron last night she feels this substantially improved and her family notes that she seems more her old self.  Past Medical History:  Diagnosis Date  . Glaucoma   . Hyperlipidemia 2010  . Hypertension 1990  . IGT (impaired glucose tolerance) 05/11/2010  . Multinodular goiter 2010   normal function    Past Surgical History:  Procedure Laterality Date  . COLONOSCOPY N/A 06/16/2016   Procedure: COLONOSCOPY;  Surgeon: Danie Binder, MD;  [facility];  Service: Endoscopy;  Laterality: N/A;  9:30 AM  .  COLONOSCOPY N/A 10/22/2019   Procedure: COLONOSCOPY;  Surgeon: Daneil Dolin, MD;  [facility];  Service: Endoscopy;  Laterality: N/A;  9:30  . EYE SURGERY  2017   3 on left eye 1 on right eye for glaucoma by Dr. Venetia Maxon   . EYE SURGERY  2018   Cataract Surgery by Dr. Arlina Robes     Family History  Problem Relation Age of Onset  . Dementia Father   . Dementia Mother   . Diabetes Mother   . Hypertension Mother   . Breast cancer Sister   . Hypertension Sister   . Diabetes Brother     Social History:  reports that she has never smoked. She has never used smokeless tobacco. She reports that she does not drink alcohol and does not use drugs.  Allergies: No Known Allergies  Medications: I have reviewed the patient's current medications.  Results for orders placed or performed during the hospital encounter of 11/21/19 (from the past 48 hour(s))  CBC with Differential     Status: Abnormal   Collection Time: 11/21/19  9:37 PM  Result Value Ref Range   WBC 7.1 4.0 - 10.5 K/uL   RBC 5.13 (H) 3.87 - 5.11 MIL/uL   Hemoglobin 14.8 12.0 - 15.0 g/dL   HCT 43.5 36 - 46 %   MCV 84.8 80.0 - 100.0 fL   MCH 28.8 26.0 - 34.0 pg   MCHC 34.0 30.0 - 36.0 g/dL   RDW 13.2 11.5 - 15.5 %  Platelets 356 150 - 400 K/uL   nRBC 0.0 0.0 - 0.2 %   Neutrophils Relative % 71 %   Neutro Abs 5.1 1.7 - 7.7 K/uL   Lymphocytes Relative 17 %   Lymphs Abs 1.2 0.7 - 4.0 K/uL   Monocytes Relative 9 %   Monocytes Absolute 0.6 0 - 1 K/uL   Eosinophils Relative 2 %   Eosinophils Absolute 0.1 0 - 0 K/uL   Basophils Relative 1 %   Basophils Absolute 0.0 0 - 0 K/uL   Immature Granulocytes 0 %   Abs Immature Granulocytes 0.01 0.00 - 0.07 K/uL    Comment: Performed at [facility], [address], [place], [place] [address]  Comprehensive metabolic panel     Status: Abnormal   Collection Time: 11/21/19  9:37 PM  Result Value Ref Range   Sodium 136 135 - 145 mmol/L   Potassium 2.8 (L) 3.5 - 5.1 mmol/L    Chloride 99 98 - 111 mmol/L   CO2 27 22 - 32 mmol/L   Glucose, Bld 155 (H) 70 - 99 mg/dL    Comment: Glucose reference range applies only to samples taken after fasting for at least 8 hours.   BUN 12 8 - 23 mg/dL   Creatinine, Ser 0.83 0.44 - 1.00 mg/dL   Calcium 8.8 (L) 8.9 - 10.3 mg/dL   Total Protein 7.2 6.5 - 8.1 g/dL   Albumin 4.3 3.5 - 5.0 g/dL   AST 37 15 - 41 U/L   ALT 35 0 - 44 U/L   Alkaline Phosphatase 52 38 - 126 U/L   Total Bilirubin 0.4 0.3 - 1.2 mg/dL   GFR calc non Af Amer >60 >60 mL/min   GFR calc Af Amer >60 >60 mL/min   Anion gap 10 5 - 15    Comment: Performed at [facility], [address] [place], [place] [address]  Urinalysis, Routine w reflex microscopic     Status: Abnormal   Collection Time: 11/21/19 10:48 PM  Result Value Ref Range   Color, Urine YELLOW YELLOW   APPearance HAZY (A) CLEAR   Specific Gravity, Urine 1.016 1.005 - 1.030   pH 5.0 5.0 - 8.0   Glucose, UA NEGATIVE NEGATIVE mg/dL   Hgb urine dipstick MODERATE (A) NEGATIVE   Bilirubin Urine NEGATIVE NEGATIVE   Ketones, ur NEGATIVE NEGATIVE mg/dL   Protein, ur NEGATIVE NEGATIVE mg/dL   Nitrite NEGATIVE NEGATIVE   Leukocytes,Ua LARGE (A) NEGATIVE   RBC / HPF 0-5 0 - 5 RBC/hpf   WBC, UA 21-50 0 - 5 WBC/hpf   Bacteria, UA RARE (A) NONE SEEN   Squamous Epithelial / LPF 0-5 0 - 5   Mucus PRESENT     Comment: Performed at [facility], [address], [place], [place] [address]  SARS Coronavirus 2 by RT PCR (hospital order, performed in [facility] hospital lab) Nasopharyngeal Nasopharyngeal Swab     Status: None   Collection Time: 11/22/19 12:41 AM   Specimen: Nasopharyngeal Swab  Result Value Ref Range   SARS Coronavirus 2 NEGATIVE NEGATIVE    Comment: (NOTE) SARS-CoV-2 target nucleic acids are NOT DETECTED.  The SARS-CoV-2 RNA is generally detectable in upper and lower respiratory specimens during the acute phase of infection. The lowest concentration of SARS-CoV-2 viral copies this assay  can detect is 250 copies / mL. A negative result does not preclude SARS-CoV-2 infection and should not be used as the sole basis for treatment or other patient management  decisions.  A negative result may occur with improper specimen collection / handling, submission of specimen other than nasopharyngeal swab, presence of viral mutation(s) within the areas targeted by this assay, and inadequate number of viral copies (<250 copies / mL). A negative result must be combined with clinical observations, patient history, and epidemiological information.  Fact Sheet for Patients:   StrictlyIdeas.no  Fact Sheet for Healthcare Providers: BankingDealers.co.za  This test is not yet approved or  cleared by the [place] FDA and has been authorized for detection and/or diagnosis of SARS-CoV-2 by FDA under an Emergency Use Authorization (EUA).  This EUA will remain in effect (meaning this test can be used) for the duration of the COVID-19 declaration under Section 564(b)(1) of the Act, 21 U.S.C. section 360bbb-3(b)(1), unless the authorization is terminated or revoked sooner.  Performed at [facility] [address], [place], [place] [address]   CBC     Status: Abnormal   Collection Time: 11/22/19  5:37 AM  Result Value Ref Range   WBC 6.3 4.0 - 10.5 K/uL   RBC 5.18 (H) 3.87 - 5.11 MIL/uL   Hemoglobin 14.8 12.0 - 15.0 g/dL   HCT 43.8 36 - 46 %   MCV 84.6 80.0 - 100.0 fL   MCH 28.6 26.0 - 34.0 pg   MCHC 33.8 30.0 - 36.0 g/dL   RDW 13.2 11.5 - 15.5 %   Platelets 377 150 - 400 K/uL   nRBC 0.0 0.0 - 0.2 %    Comment: Performed at [facility], [address] [place], [place] [address]  Comprehensive metabolic panel     Status: Abnormal   Collection Time: 11/22/19  5:37 AM  Result Value Ref Range   Sodium 140 135 - 145 mmol/L   Potassium 4.0 3.5 - 5.1 mmol/L    Comment: DELTA CHECK NOTED   Chloride 104 98 - 111 mmol/L   CO2 25 22 - 32  mmol/L   Glucose, Bld 138 (H) 70 - 99 mg/dL    Comment: Glucose reference range applies only to samples taken after fasting for at least 8 hours.   BUN 10 8 - 23 mg/dL   Creatinine, Ser 0.65 0.44 - 1.00 mg/dL   Calcium 9.0 8.9 - 10.3 mg/dL   Total Protein 7.4 6.5 - 8.1 g/dL   Albumin 4.4 3.5 - 5.0 g/dL   AST 32 15 - 41 U/L   ALT 34 0 - 44 U/L   Alkaline Phosphatase 53 38 - 126 U/L   Total Bilirubin 0.7 0.3 - 1.2 mg/dL   GFR calc non Af Amer >60 >60 mL/min   GFR calc Af Amer >60 >60 mL/min   Anion gap 11 5 - 15    Comment: Performed at [facility], [address], [place], [place] [address]  Magnesium     Status: None   Collection Time: 11/22/19  5:37 AM  Result Value Ref Range   Magnesium 1.9 1.7 - 2.4 mg/dL    Comment: Performed at [facility], [address] [place],  [address]    DG Chest 2 View  Result Date: 11/22/2019 CLINICAL DATA:  Encephalopathy.  Newly discovered brain mass. EXAM: CHEST - 2 VIEW COMPARISON:  None. FINDINGS: The heart size and mediastinal contours are within normal limits. Both lungs are clear. The visualized skeletal structures are unremarkable. IMPRESSION: No active cardiopulmonary disease. Electronically Signed   By: Ulyses Jarred M.D.   On: 11/22/2019 04:03   CT Head Wo Contrast  Result Date: 11/22/2019 CLINICAL DATA:  Altered level of consciousness for 4 days, dizziness EXAM: CT HEAD WITHOUT CONTRAST TECHNIQUE: Contiguous axial images were obtained from the base of the skull through the vertex without intravenous contrast. COMPARISON:  None. FINDINGS: Brain: There is significant vasogenic edema in the left frontal parietal region. 3.2 cm mass is seen within the left parietal white matter. MRI with and without contrast is recommended for further evaluation. Hypodensity extends along the posterior aspect of the corpus callosum, either extension of the mass or edema. I do not see any definite hemorrhage. There is significant effacement of the left  lateral ventricle, with rightward midline shift measuring up to 4 mm at the septum pellucidum. There is diffuse sulcal effacement throughout the left cerebral hemisphere, greatest along the convexity. No acute extra-axial fluid collections. Vascular: No hyperdense vessel or unexpected calcification. Skull: Normal. Negative for fracture or focal lesion. Sinuses/Orbits: Small metallic densities are seen within the globes bilaterally, likely related to prior glaucoma surgery. Please correlate with surgical history. Otherwise the orbits are unremarkable. Other: None. IMPRESSION: 1. 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 MRI with and without contrast is recommended. 2. Extensive vasogenic edema throughout the left cerebral hemisphere, with significant sulcal effacement and minimal rightward midline shift. Significant effacement of the left lateral ventricle. These results were called by telephone at the time of interpretation on 11/22/2019 at 12:34 am to provider IVA KNAPP , who verbally acknowledged these results. 1. Extensive cerebral edema Electronically Signed   By: Randa Ngo M.D.   On: 11/22/2019 00:34    Review of Systems  Constitutional: Positive for activity change.  HENT: Negative.   Eyes: Negative.   Respiratory: Negative.   Cardiovascular: Negative.   Gastrointestinal: Negative.   Endocrine: Negative.   Genitourinary: Negative.   Musculoskeletal: Negative.   Skin: Negative.   Neurological: Positive for speech difficulty.  Hematological: Negative.   Psychiatric/Behavioral: Negative.    Blood pressure (!) 150/77, pulse 95, temperature 98.5 F (36.9 C), temperature source Oral, resp. rate 18, height 5\' 8"  (1.727 m), weight 68.9 kg, SpO2 98 %. Physical Exam Constitutional:      Appearance: Normal appearance.  HENT:     Head: Normocephalic and atraumatic.     Left  Ear: Tympanic membrane normal.     Nose: Nose normal.     Mouth/Throat:     Mouth: Mucous membranes are moist.  Eyes:     Extraocular Movements: Extraocular movements intact.     Pupils: Pupils are equal, round, and reactive to light.  Cardiovascular:     Rate and Rhythm: Normal rate and regular rhythm.     Pulses: Normal pulses.     Heart sounds: Normal heart sounds.  Pulmonary:     Effort: Pulmonary effort is normal.     Breath sounds: Normal breath sounds.  Abdominal:     General: Abdomen is flat.     Palpations: Abdomen is soft.  Musculoskeletal:        General: Normal range of motion.     Cervical back: Normal range of motion and neck supple.  Skin:    General: Skin is warm and dry.     Capillary Refill: Capillary refill takes less than 2 seconds.  Neurological:     Mental Status: She is alert.     Comments: Mild right central 7th nerve weakness, other cranial nerves are intact, no evidence  of a focal drift, Station and gait are intact.  Babinski is positive on the right  Psychiatric:        Mood and Affect: Mood normal.        Behavior: Behavior normal.     Assessment/Plan: Left thalamic lesion with significant mass-effect and edema.  This may represent either a primary glioma or metastatic lesion.  Plan patient is to undergo an MRI of the brain in addition to basic metastatic work-up.  Wanda Ross Laverne Hursey 11/22/2019, 7:38 PM

## 2019-11-22 NOTE — ED Notes (Signed)
Meal provided 

## 2019-11-22 NOTE — ED Notes (Signed)
Purewick put on pt.

## 2019-11-22 NOTE — ED Notes (Addendum)
Bed assigned and marked ready   Report to [facility] by A Tikia Skilton,l

## 2019-11-22 NOTE — ED Provider Notes (Signed)
[facility] EMERGENCY DEPARTMENT Provider Note   CSN: [id number] Arrival date & time: 11/21/19  2055   Time seen 11:15 PM  History Chief Complaint  Patient presents with  . Altered Mental Status    Wanda Ross is a 69 y.o. female.  HPI Patient is here with her daughter.  She states that on June 19 her brothers noted that the patient seemed to have trouble of her focus and she seemed to be in a mental "fog".  She seemed distant and hazy.  She had some decreased appetite.  They also mention something she was driving and she had a parked car and in her yard.  She denies cough, fever, chest pain, shortness of breath, numbness or tingling of her extremities, nausea, vomiting, diarrhea, any change in urination, or fever.  She is never had a thing like this before.  She does describe a mild frontal headache that she describes as tension that has been there since June 19.  She also describes her sciatica on the right started getting worse over the past week and she was taking Advil dual strength for that.  Patient is left-handed.  PCP Fayrene Helper, MD     Past Medical History:  Diagnosis Date  . Glaucoma   . Hyperlipidemia 2010  . Hypertension 1990  . IGT (impaired glucose tolerance) 05/11/2010  . Multinodular goiter 2010   normal function    Patient Active Problem List   Diagnosis Date Noted  . 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 Abnormal electrocardiogram (ECG) (EKG) 12/31/2017  . White coat syndrome with diagnosis of hypertension 12/31/2017  . Glaucoma 12/26/2013  . Vitamin D deficiency 05/27/2009  . IMPAIRED GLUCOSE TOLERANCE 05/27/2009  . OSTEOPENIA 05/27/2009  . Multiple thyroid nodules 11/06/2007  . Hyperlipidemia 11/06/2007  . 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 COLONOSCOPY N/A 06/16/2016   Procedure: COLONOSCOPY;  Surgeon: Danie Binder, MD;  Location: [facility];  Service: Endoscopy;  Laterality: N/A;   9:30 AM  . COLONOSCOPY N/A 10/22/2019   Procedure: COLONOSCOPY;  Surgeon: Daneil Dolin, MD;  Location: [facility];  Service: Endoscopy;  Laterality: N/A;  9:30  . EYE SURGERY  2017   3 on left eye 1 on right eye for glaucoma by Dr. Venetia Maxon   . EYE SURGERY  2018   Cataract Surgery by Dr. Arlina Robes      OB History   No obstetric history on file.     Family History  Problem Relation Age of Onset  . Dementia Father   . Dementia Mother   . Diabetes Mother   . Hypertension Mother   . Breast cancer Sister   . Hypertension Sister   . Diabetes Brother     Social History   Tobacco Use  . Smoking status: Never Smoker  . Smokeless tobacco: Never Used  Vaping Use  . Vaping Use: Never used  Substance Use Topics  . Alcohol use: No  . Drug use: No  Lives at home Lives alone  Home Medications Prior to Admission medications   Medication Sig Start Date End Date Taking? Authorizing Provider  alendronate (FOSAMAX) 70 MG tablet Take 1 tablet (70 mg total) by mouth every 7 (seven) days. Take with a full glass of water on an empty stomach. 06/26/19  Yes Fayrene Helper, MD  amLODipine (NORVASC) 5 MG tablet Take 1 tablet (5 mg total) by mouth daily. 06/09/19  Yes Tula Nakayama  E, MD  Calcium Carbonate-Vit D-Min (CALCIUM 1200) 1200-1000 MG-UNIT CHEW Chew 1,200 mg by mouth daily.     Yes [provider]  cloNIDine (CATAPRES) 0.3 MG tablet TAKE 1 TABLET BY MOUTH EVERYDAY AT BEDTIME Patient taking differently: Take 0.3 mg by mouth at bedtime.  07/23/19  Yes Fayrene Helper, MD  dorzolamide (TRUSOPT) 2 % ophthalmic solution Place 1 drop into the right eye in the morning and at bedtime. Places in right eye daily.    Yes [provider]  Multiple Vitamins-Minerals (CENTRUM SILVER ULTRA WOMENS) TABS Take 1 tablet by mouth daily.    Yes [provider]  pravastatin (PRAVACHOL) 10 MG tablet TAKE 1 TABLET BY MOUTH DAILY *DOSE REDUCTION* Patient taking differently:  Take 10 mg by mouth daily. TAKE 1 TABLET BY MOUTH DAILY *DOSE REDUCTION* 04/28/19  Yes Fayrene Helper, MD  triamterene-hydrochlorothiazide (MAXZIDE) 75-50 MG tablet TAKE 1 TABLET BY MOUTH EVERY DAY Patient taking differently: Take 0.5 tablets by mouth daily.  12/03/18  Yes Perlie Mayo, NP  cholecalciferol (VITAMIN D) 25 MCG (1000 UNIT) tablet Take 1,000 mg by mouth daily.     [provider]    Allergies    Patient has no known allergies.  Review of Systems   Review of Systems  All other systems reviewed and are negative.   Physical Exam Updated Vital Signs BP (!) 144/72   Pulse 95   Temp 99.7 F (37.6 C) (Oral)   Resp 18   Ht 5\' 8"  (1.727 m)   Wt 68.9 kg   SpO2 99%   BMI 23.11 kg/m   Physical Exam Vitals and nursing note reviewed.  Constitutional:      General: She is not in acute distress.    Appearance: Normal appearance. She is normal weight.  HENT:     Head: Normocephalic and atraumatic.     Right Ear: External ear normal.     Left Ear: External ear normal.     Nose: Nose normal.     Mouth/Throat:     Mouth: Mucous membranes are moist.     Pharynx: No oropharyngeal exudate or posterior oropharyngeal erythema.  Eyes:     Extraocular Movements: Extraocular movements intact.     Conjunctiva/sclera: Conjunctivae normal.     Pupils: Pupils are equal, round, and reactive to light.  Cardiovascular:     Rate and Rhythm: Normal rate and regular rhythm.     Pulses: Normal pulses.     Heart sounds: No murmur heard.   Pulmonary:     Effort: Pulmonary effort is normal. No respiratory distress.     Breath sounds: Normal breath sounds.  Musculoskeletal:        General: Normal range of motion.     Cervical back: Normal range of motion and neck supple.  Skin:    General: Skin is warm and dry.     Findings: No lesion.  Neurological:     Mental Status: She is alert and oriented to person, place, and time.     Cranial Nerves: No cranial nerve deficit.      Comments: Patient has mild pronator drift on the right, she drifts medially.  She seems to have difficulty holding her right lower extremity up off the stretcher compared to the left.  Psychiatric:        Mood and Affect: Mood normal.        Behavior: Behavior normal.        Thought Content: Thought content  normal.     ED Results / Procedures / Treatments   Labs (all labs ordered are listed, but only abnormal results are displayed) Results for orders placed or performed during the hospital encounter of 11/21/19  SARS Coronavirus 2 by RT PCR (hospital order, performed in [place] hospital lab) Nasopharyngeal Nasopharyngeal Swab   Specimen: Nasopharyngeal Swab  Result Value Ref Range   SARS Coronavirus 2 NEGATIVE NEGATIVE  CBC with Differential  Result Value Ref Range   WBC 7.1 4.0 - 10.5 K/uL   RBC 5.13 (H) 3.87 - 5.11 MIL/uL   Hemoglobin 14.8 12.0 - 15.0 g/dL   HCT 43.5 36 - 46 %   MCV 84.8 80.0 - 100.0 fL   MCH 28.8 26.0 - 34.0 pg   MCHC 34.0 30.0 - 36.0 g/dL   RDW 13.2 11.5 - 15.5 %   Platelets 356 150 - 400 K/uL   nRBC 0.0 0.0 - 0.2 %   Neutrophils Relative % 71 %   Neutro Abs 5.1 1.7 - 7.7 K/uL   Lymphocytes Relative 17 %   Lymphs Abs 1.2 0.7 - 4.0 K/uL   Monocytes Relative 9 %   Monocytes Absolute 0.6 0 - 1 K/uL   Eosinophils Relative 2 %   Eosinophils Absolute 0.1 0 - 0 K/uL   Basophils Relative 1 %   Basophils Absolute 0.0 0 - 0 K/uL   Immature Granulocytes 0 %   Abs Immature Granulocytes 0.01 0.00 - 0.07 K/uL  Comprehensive metabolic panel  Result Value Ref Range   Sodium 136 135 - 145 mmol/L   Potassium 2.8 (L) 3.5 - 5.1 mmol/L   Chloride 99 98 - 111 mmol/L   CO2 27 22 - 32 mmol/L   Glucose, Bld 155 (H) 70 - 99 mg/dL   BUN 12 8 - 23 mg/dL   Creatinine, Ser 0.83 0.44 - 1.00 mg/dL   Calcium 8.8 (L) 8.9 - 10.3 mg/dL   Total Protein 7.2 6.5 - 8.1 g/dL   Albumin 4.3 3.5 - 5.0 g/dL   AST 37 15 - 41 U/L   ALT 35 0 - 44 U/L   Alkaline Phosphatase 52 38 - 126  U/L   Total Bilirubin 0.4 0.3 - 1.2 mg/dL   GFR calc non Af Amer >60 >60 mL/min   GFR calc Af Amer >60 >60 mL/min   Anion gap 10 5 - 15  Urinalysis, Routine w reflex microscopic  Result Value Ref Range   Color, Urine YELLOW YELLOW   APPearance HAZY (A) CLEAR   Specific Gravity, Urine 1.016 1.005 - 1.030   pH 5.0 5.0 - 8.0   Glucose, UA NEGATIVE NEGATIVE mg/dL   Hgb urine dipstick MODERATE (A) NEGATIVE   Bilirubin Urine NEGATIVE NEGATIVE   Ketones, ur NEGATIVE NEGATIVE mg/dL   Protein, ur NEGATIVE NEGATIVE mg/dL   Nitrite NEGATIVE NEGATIVE   Leukocytes,Ua LARGE (A) NEGATIVE   RBC / HPF 0-5 0 - 5 RBC/hpf   WBC, UA 21-50 0 - 5 WBC/hpf   Bacteria, UA RARE (A) NONE SEEN   Squamous Epithelial / LPF 0-5 0 - 5   Mucus PRESENT    Laboratory interpretation all normal except UTI UTI, hypokalemia, nonfasting hyperglycemia    EKG None  Radiology CT Head Wo Contrast  Result Date: 11/22/2019 CLINICAL DATA:  Altered level of consciousness for 4 days, dizziness EXAM: CT HEAD WITHOUT CONTRAST TECHNIQUE: Contiguous axial images were obtained from the base of the skull through the vertex without intravenous contrast. COMPARISON:  None. FINDINGS: Brain: There is significant vasogenic edema in the left frontal parietal region. 3.2 cm mass is seen within the left parietal white matter. MRI with and without contrast is recommended for further evaluation. Hypodensity extends along the posterior aspect of the corpus callosum, either extension of the mass or edema. I do not see any definite hemorrhage. There is significant effacement of the left lateral ventricle, with rightward midline shift measuring up to 4 mm at the septum pellucidum. There is diffuse sulcal effacement throughout the left cerebral hemisphere, greatest along the convexity. No acute extra-axial fluid collections. Vascular: No hyperdense vessel or unexpected calcification. Skull: Normal. Negative for fracture or focal lesion. Sinuses/Orbits:  Small metallic densities are seen within the globes bilaterally, likely related to prior glaucoma surgery. Please correlate with surgical history. Otherwise the orbits are unremarkable. Other: None. IMPRESSION: 1. 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 MRI with and without contrast is recommended. 2. Extensive vasogenic edema throughout the left cerebral hemisphere, with significant sulcal effacement and minimal rightward midline shift. Significant effacement of the left lateral ventricle. These results were called by telephone at the time of interpretation on 11/22/2019 at 12:34 am to provider Seaborn Nakama , who verbally acknowledged these results. 1. Extensive cerebral edema Electronically Signed   By: Randa Ngo M.D.   On: 11/22/2019 00:34    Procedures .Critical Care Performed by: Rolland Porter, MD Authorized by: Rolland Porter, MD   Critical care provider statement:    Critical care time (minutes):  40   Critical care was necessary to treat or prevent imminent or life-threatening deterioration of the following conditions:  CNS failure or compromise   Critical care was time spent personally by me on the following activities:  Discussions with consultants, examination of patient, obtaining history from patient or surrogate, ordering and review of laboratory studies, ordering and review of radiographic studies, re-evaluation of patient's condition and review of old charts   (including critical care time)  Medications Ordered in ED Medications  potassium chloride SA (KLOR-CON) CR tablet 40 mEq (40 mEq Oral Given 11/22/19 0033)  cephALEXin (KEFLEX) capsule 500 mg (500 mg Oral Given 11/22/19 0034)  dexamethasone (DECADRON) injection 10 mg (10 mg Intravenous Given 11/22/19 0037)    ED Course  I have reviewed the triage vital signs and the nursing notes.  Pertinent labs & imaging  results that were available during my care of the patient were reviewed by me and considered in my medical decision making (see chart for details).    MDM Rules/Calculators/A&P                          Patient was given oral potassium for her hypokalemia and Keflex for possible UTI.  Urine culture was ordered.  CT of the head was ordered.  Patient has some very mild right-sided weakness.  12:31 AM radiologist called her CT report, he says it looks like she has a mass with some edema and a 4 mm shift.  He recommends MRI with and without contrast.  He wants to clarify about her eye surgery because he does see metal densities in both eyes.  He also question whether she had any prior history of cancer, when I talked to the patient she states no.  Patient was given Decadron IV.  12:40 AM I talked to the patient and her daughter about her  CT result.  She states she is only had some mild headache behind her eyes.  01:43 AM Dr Marcello Moores, Neurosurgery, has looked at her CT, recommends decadron 6 mg q6h, needs admission to progressive care unit 4N to medicine for metastatic workup and will most likely need radiation treatment.   Dr Darrick Meigs, hospitalist, will admit  Final Clinical Impression(s) / ED Diagnoses Final diagnoses:  Brain mass  Hypokalemia  Urinary tract infection without hematuria, site unspecified  Right sided weakness    Rx / DC Orders  Plan admission  Rolland Porter, MD, Barbette Or, MD 11/22/19 [phone]

## 2019-11-22 NOTE — Progress Notes (Signed)
Patient seen and examined. Admitted after midnight secondary to intermittent and abnormal gait (poor balance). 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 Further evaluation also showed dirty urine (without dysuria, but reporting frequency) and hypokalemia. Please refer to H&P written by Dr. Darrick Meigs for further info/details on admission.   Physical exam: General exam: Alert, awake, oriented x 2, slow to respond, but appropriate. Reports no HA's, blurred vision, dizziness, CP or SOB. No nausea or vomiting.  Respiratory system: Clear to auscultation. Respiratory effort normal. Cardiovascular system:RRR. No murmurs, rubs, gallops. No JVD Gastrointestinal system: Abdomen is nondistended, soft and nontender. No organomegaly or masses felt. Normal bowel sounds heard. Central nervous system: No major focal neurological deficits. Patient demonstrated slight decrease in her ability for fine movements on the left and had to think twice before performing commands (especially with her left sided limbs). Normal sensation to touch. Extremities: No C/C/E, +pedal pulses. Skin: No rashes, lesions or ulcers. Psychiatry: Judgement and insight appear normal. Mood & affect appropriate.    Plan: -continue decadron -transfer to [facility] -follow MRI results and further recommendations by neurosurgery -follow urine cultures and if not concerns for UTI discontinue antibiotics as early as possible. (empirically started on rocephin) -follow Mg levels, continue holding diuretics and further replete potassium as needed.  Barton Dubois MD [phone]

## 2019-11-22 NOTE — ED Notes (Signed)
Resting with even, unlabored resp   Daughter [name] [phone]  Continues to await a room assignment from [facility]

## 2019-11-22 NOTE — H&P (Addendum)
TRH H&P    Patient Demographics:    Wanda Ross, is a 69 y.o. female  MRN: [id number]  DOB - 12-Oct-1950  Admit Date - 11/21/2019  Referring MD/NP/PA: Dr. Tomi Bamberger  Outpatient Primary MD for the patient is Moshe Cipro Norwood Levo, MD  Patient coming from: Home  Chief complaint-confusion, gait imbalance   HPI:    Wanda Ross  is a 69 y.o. female, with history of glaucoma, hyperlipidemia, hypertension was brought to the ED due to confusion and gait imbalance.  As per patient daughter, patient was having intermittent confusion.  Patient denies chest pain, shortness of breath, nausea, vomiting or diarrhea.  Denies blurred vision.  No speech disturbance. Patient complained of headache, poor appetite for past 2 weeks.  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 Denies abdominal pain or dysuria Denies weight loss No previous history of malignancy In the ED CT of the head was obtained which showed ill-defined 3.2 cm mass within the left parietal white matter, compatible malignancy.  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  Extensive vasogenic edema noted throughout the left cerebral hemisphere with significant sulcal effacement and minimal rightward midline shift. MRI brain with and without contrast recommended. Neurosurgeon Dr. Marcello Moores was consulted by ED physician who recommended Decadron, MRI brain with and without contrast and transfer to [facility].    Review of systems:    In addition to the HPI above,   All other systems reviewed and are negative.    Past History of the following :    Past Medical History:  Diagnosis Date  . Glaucoma   . Hyperlipidemia 2010  . Hypertension 1990  . IGT (impaired glucose tolerance) 05/11/2010  . Multinodular goiter 2010   normal function      Past Surgical History:  Procedure Laterality Date  . COLONOSCOPY N/A 06/16/2016   Procedure: COLONOSCOPY;  Surgeon: Danie Binder, MD;  [facility];  Service: Endoscopy;  Laterality: N/A;  9:30 AM  . COLONOSCOPY N/A 10/22/2019   Procedure: COLONOSCOPY;  Surgeon: Daneil Dolin, MD;  Location: [facility];  Service: Endoscopy;  Laterality: N/A;  9:30  . EYE SURGERY  2017   3 on left eye 1 on right eye for glaucoma by Dr. Venetia Maxon   . EYE SURGERY  2018   Cataract Surgery by Dr. Arlina Robes       Social History:      Social History   Tobacco Use  . Smoking status: Never Smoker  . Smokeless tobacco: Never Used  Substance Use Topics  . Alcohol use: No       Family History :     Family History  Problem Relation Age of Onset  . Dementia Father   . Dementia Mother   . Diabetes Mother   . Hypertension Mother   . Breast cancer Sister   . Hypertension Sister   . Diabetes Brother       Home Medications:   Prior to Admission medications   Medication Sig Start Date End Date Taking? Authorizing Provider  alendronate (FOSAMAX) 70  MG tablet Take 1 tablet (70 mg total) by mouth every 7 (seven) days. Take with a full glass of water on an empty stomach. 06/26/19  Yes Fayrene Helper, MD  amLODipine (NORVASC) 5 MG tablet Take 1 tablet (5 mg total) by mouth daily. 06/09/19  Yes Fayrene Helper, MD  Calcium Carbonate-Vit D-Min (CALCIUM 1200) 1200-1000 MG-UNIT CHEW Chew 1,200 mg by mouth daily.     Yes [provider]  cloNIDine (CATAPRES) 0.3 MG tablet TAKE 1 TABLET BY MOUTH EVERYDAY AT BEDTIME Patient taking differently: Take 0.3 mg by mouth at bedtime.  07/23/19  Yes Fayrene Helper, MD  dorzolamide (TRUSOPT) 2 % ophthalmic solution Place 1 drop into the right eye in the morning and at bedtime. Places in right eye daily.    Yes [provider]  Multiple Vitamins-Minerals (CENTRUM SILVER ULTRA WOMENS) TABS Take 1 tablet by mouth daily.    Yes [provider]  pravastatin (PRAVACHOL) 10 MG tablet TAKE 1 TABLET BY MOUTH DAILY *DOSE REDUCTION* Patient taking  differently: Take 10 mg by mouth daily. TAKE 1 TABLET BY MOUTH DAILY *DOSE REDUCTION* 04/28/19  Yes Fayrene Helper, MD  triamterene-hydrochlorothiazide (MAXZIDE) 75-50 MG tablet TAKE 1 TABLET BY MOUTH EVERY DAY Patient taking differently: Take 0.5 tablets by mouth daily.  12/03/18  Yes Perlie Mayo, NP  cholecalciferol (VITAMIN D) 25 MCG (1000 UNIT) tablet Take 1,000 mg by mouth daily.     [provider]     Allergies:    No Known Allergies   Physical Exam:   Vitals  Blood pressure (!) 145/79, pulse 93, temperature 99.7 F (37.6 C), temperature source Oral, resp. rate 14, height 5\' 8"  (1.727 m), weight 68.9 kg, SpO2 97 %.  1.  General: Appears in no acute distress  2. Psychiatric: Alert, oriented x3, intact insight and judgment  3. Neurologic: Cranial nerves II through XII grossly intact, mild weakness of right upper and lower extremity  4. HEENMT:  Atraumatic normocephalic, extraocular muscles are intact  5. Respiratory : Clear to auscultation bilaterally, no wheezing or crackles auscultated  6. Cardiovascular : S1-S2, regular, no murmur auscultated  7. Gastrointestinal:  Abdomen is soft, nontender, no organomegaly      Data Review:    CBC Recent Labs  Lab 11/21/19 2137  WBC 7.1  HGB 14.8  HCT 43.5  PLT 356  MCV 84.8  MCH 28.8  MCHC 34.0  RDW 13.2  LYMPHSABS 1.2  MONOABS 0.6  EOSABS 0.1  BASOSABS 0.0   ------------------------------------------------------------------------------------------------------------------  Results for orders placed or performed during the hospital encounter of 11/21/19 (from the past 48 hour(s))  CBC with Differential     Status: Abnormal   Collection Time: 11/21/19  9:37 PM  Result Value Ref Range   WBC 7.1 4.0 - 10.5 K/uL   RBC 5.13 (H) 3.87 - 5.11 MIL/uL   Hemoglobin 14.8 12.0 - 15.0 g/dL   HCT 43.5 36 - 46 %   MCV 84.8 80.0 - 100.0 fL   MCH 28.8 26.0 - 34.0 pg   MCHC 34.0 30.0 - 36.0 g/dL   RDW  13.2 11.5 - 15.5 %   Platelets 356 150 - 400 K/uL   nRBC 0.0 0.0 - 0.2 %   Neutrophils Relative % 71 %   Neutro Abs 5.1 1.7 - 7.7 K/uL   Lymphocytes Relative 17 %   Lymphs Abs 1.2 0.7 - 4.0 K/uL   Monocytes Relative 9 %   Monocytes Absolute 0.6  0 - 1 K/uL   Eosinophils Relative 2 %   Eosinophils Absolute 0.1 0 - 0 K/uL   Basophils Relative 1 %   Basophils Absolute 0.0 0 - 0 K/uL   Immature Granulocytes 0 %   Abs Immature Granulocytes 0.01 0.00 - 0.07 K/uL    Comment: Performed at [facility], [address], [place], [place] [address]  Comprehensive metabolic panel     Status: Abnormal   Collection Time: 11/21/19  9:37 PM  Result Value Ref Range   Sodium 136 135 - 145 mmol/L   Potassium 2.8 (L) 3.5 - 5.1 mmol/L   Chloride 99 98 - 111 mmol/L   CO2 27 22 - 32 mmol/L   Glucose, Bld 155 (H) 70 - 99 mg/dL    Comment: Glucose reference range applies only to samples taken after fasting for at least 8 hours.   BUN 12 8 - 23 mg/dL   Creatinine, Ser 0.83 0.44 - 1.00 mg/dL   Calcium 8.8 (L) 8.9 - 10.3 mg/dL   Total Protein 7.2 6.5 - 8.1 g/dL   Albumin 4.3 3.5 - 5.0 g/dL   AST 37 15 - 41 U/L   ALT 35 0 - 44 U/L   Alkaline Phosphatase 52 38 - 126 U/L   Total Bilirubin 0.4 0.3 - 1.2 mg/dL   GFR calc non Af Amer >60 >60 mL/min   GFR calc Af Amer >60 >60 mL/min   Anion gap 10 5 - 15    Comment: Performed at [facility], [address] [place], [place] [address]  Urinalysis, Routine w reflex microscopic     Status: Abnormal   Collection Time: 11/21/19 10:48 PM  Result Value Ref Range   Color, Urine YELLOW YELLOW   APPearance HAZY (A) CLEAR   Specific Gravity, Urine 1.016 1.005 - 1.030   pH 5.0 5.0 - 8.0   Glucose, UA NEGATIVE NEGATIVE mg/dL   Hgb urine dipstick MODERATE (A) NEGATIVE   Bilirubin Urine NEGATIVE NEGATIVE   Ketones, ur NEGATIVE NEGATIVE mg/dL   Protein, ur NEGATIVE NEGATIVE mg/dL   Nitrite NEGATIVE NEGATIVE   Leukocytes,Ua LARGE (A) NEGATIVE   RBC / HPF 0-5 0 - 5  RBC/hpf   WBC, UA 21-50 0 - 5 WBC/hpf   Bacteria, UA RARE (A) NONE SEEN   Squamous Epithelial / LPF 0-5 0 - 5   Mucus PRESENT     Comment: Performed at [facility], [address], [place],  [address]  SARS Coronavirus 2 by RT PCR (hospital order, performed in [place] hospital lab) Nasopharyngeal Nasopharyngeal Swab     Status: None   Collection Time: 11/22/19 12:41 AM   Specimen: Nasopharyngeal Swab  Result Value Ref Range   SARS Coronavirus 2 NEGATIVE NEGATIVE    Comment: (NOTE) SARS-CoV-2 target nucleic acids are NOT DETECTED.  The SARS-CoV-2 RNA is generally detectable in upper and lower respiratory specimens during the acute phase of infection. The lowest concentration of SARS-CoV-2 viral copies this assay can detect is 250 copies / mL. A negative result does not preclude SARS-CoV-2 infection and should not be used as the sole basis for treatment or other patient management decisions.  A negative result may occur with improper specimen collection / handling, submission of specimen other than nasopharyngeal swab, presence of viral mutation(s) within the areas targeted by this assay, and inadequate number of viral copies (<250 copies / mL). A negative result must be combined with clinical observations, patient history, and epidemiological information.  Fact  Sheet for Patients:   StrictlyIdeas.no  Fact Sheet for Healthcare Providers: BankingDealers.co.za  This test is not yet approved or  cleared by the [place] FDA and has been authorized for detection and/or diagnosis of SARS-CoV-2 by FDA under an Emergency Use Authorization (EUA).  This EUA will remain in effect (meaning this test can be used) for the duration of the COVID-19 declaration under Section 564(b)(1) of the Act, 21 U.S.C. section 360bbb-3(b)(1), unless the authorization is terminated or revoked sooner.  Performed at [facility], [address],  [place], [place] [address]     Chemistries  Recent Labs  Lab 11/21/19 2137  NA 136  K 2.8*  CL 99  CO2 27  GLUCOSE 155*  BUN 12  CREATININE 0.83  CALCIUM 8.8*  AST 37  ALT 35  ALKPHOS 52  BILITOT 0.4   ------------------------------------------------------------------------------------------------------------------  ------------------------------------------------------------------------------------------------------------------ GFR: Estimated Creatinine Clearance: 62.7 mL/min (by C-G formula based on SCr of 0.83 mg/dL). Liver Function Tests: Recent Labs  Lab 11/21/19 2137  AST 37  ALT 35  ALKPHOS 52  BILITOT 0.4  PROT 7.2  ALBUMIN 4.3    --------------------------------------------------------------------------------------------------------------- Urine analysis:    Component Value Date/Time   COLORURINE YELLOW 11/21/2019 2248   APPEARANCEUR HAZY (A) 11/21/2019 2248   LABSPEC 1.016 11/21/2019 2248   PHURINE 5.0 11/21/2019 2248   GLUCOSEU NEGATIVE 11/21/2019 2248   HGBUR MODERATE (A) 11/21/2019 2248   BILIRUBINUR NEGATIVE 11/21/2019 2248   KETONESUR NEGATIVE 11/21/2019 2248   PROTEINUR NEGATIVE 11/21/2019 2248   NITRITE NEGATIVE 11/21/2019 2248   LEUKOCYTESUR LARGE (A) 11/21/2019 2248      Imaging Results:    CT Head Wo Contrast  Result Date: 11/22/2019 CLINICAL DATA:  Altered level of consciousness for 4 days, dizziness EXAM: CT HEAD WITHOUT CONTRAST TECHNIQUE: Contiguous axial images were obtained from the base of the skull through the vertex without intravenous contrast. COMPARISON:  None. FINDINGS: Brain: There is significant vasogenic edema in the left frontal parietal region. 3.2 cm mass is seen within the left parietal white matter. MRI with and without contrast is recommended for further evaluation. Hypodensity extends along the posterior aspect of the corpus callosum, either extension of the mass or edema. I do not see any definite hemorrhage. There is  significant effacement of the left lateral ventricle, with rightward midline shift measuring up to 4 mm at the septum pellucidum. There is diffuse sulcal effacement throughout the left cerebral hemisphere, greatest along the convexity. No acute extra-axial fluid collections. Vascular: No hyperdense vessel or unexpected calcification. Skull: Normal. Negative for fracture or focal lesion. Sinuses/Orbits: Small metallic densities are seen within the globes bilaterally, likely related to prior glaucoma surgery. Please correlate with surgical history. Otherwise the orbits are unremarkable. Other: None. IMPRESSION: 1. 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 MRI with and without contrast is recommended. 2. Extensive vasogenic edema throughout the left cerebral hemisphere, with significant sulcal effacement and minimal rightward midline shift. Significant effacement of the left lateral ventricle. These results were called by telephone at the time of interpretation on 11/22/2019 at 12:34 am to provider IVA KNAPP , who verbally acknowledged these results. 1. Extensive cerebral edema Electronically Signed   By: Randa Ngo M.D.   On: 11/22/2019 00:34       Assessment & Plan:    Active Problems:   Brain mass   1. Brain mass-CT head shows brain mass with  vasogenic edema throughout the left cerebral hemisphere with minimal rightward midline shift.  Decadron 10 mg IV given in the ED.  Will start Decadron 6 mg p.o. every 6 hours as per neurosurgery recommendation.  Patient will be [facility], will order MRI brain with and without contrast.  Based on the results patient will likely also need CT abdomen pelvis, chest to look for primary site of malignancy.  Neurochecks every 2 hours x12 hours. 2. Abnormal UA-patient found to have abnormal UA, denies dysuria.  Start IV ceftriaxone,  urine culture has been obtained. 3. Hypertension-continue amlodipine, Catapres.  Hold triamterene/HCTZ due to hypokalemia. 4. Hyperlipidemia-continue pravastatin 5. Hypokalemia-potassium was 2.8, will hold triamterene/HCTZ.  Replace potassium and follow BMP in a.m.   DVT Prophylaxis-SCDs  AM Labs Ordered, also please review Full Orders  Family Communication: Admission, patients condition and plan of care including tests being ordered have been discussed with the patient and her daughter at bedside* who indicate understanding and agree with the plan and Code Status.  Code Status: Full code  Admission status: Inpatient :The appropriate admission status for this patient is INPATIENT. Inpatient status is judged to be reasonable and necessary in order to provide the required intensity of service to ensure the patient's safety. The patient's presenting symptoms, physical exam findings, and initial radiographic and laboratory data in the context of their chronic comorbidities is felt to place them at high risk for further clinical deterioration. Furthermore, it is not anticipated that the patient will be medically stable for discharge from the hospital within 2 midnights of admission. The following factors support the admission status of inpatient.     The patient's presenting symptoms include gait imbalance, confusion. The worrisome physical exam findings include none. The initial radiographic and laboratory data are worrisome because of brain mass. The chronic co-morbidities include hypertension.       * I certify that at the point of admission it is my clinical judgment that the patient will require inpatient hospital care spanning beyond 2 midnights from the point of admission due to high intensity of service, high risk for further deterioration and high frequency of surveillance required.*  Time spent in minutes : 60 minutes   Cartier Washko S Twilia Yaklin M.D

## 2019-11-22 NOTE — Progress Notes (Signed)
Neurosurgery Called by Dr. Tomi Bamberger to review CT for patient Wanda Ross. There is a brain mass with surrounding vasogenic edema in her left frontal lobe. This would be concerning for metastasis versus primary brain tumor. I have advised to transfer to [facility] to the hospital service for metastatic work up as well as MRI brain with and without contrast including brain lab protocol. Depending on the results, Further recommendatio ns will follow. She is being started on Decadron.

## 2019-11-23 ENCOUNTER — Inpatient Hospital Stay (HOSPITAL_COMMUNITY): Payer: Medicare PPO

## 2019-11-23 DIAGNOSIS — I10 Essential (primary) hypertension: Secondary | ICD-10-CM

## 2019-11-23 DIAGNOSIS — E785 Hyperlipidemia, unspecified: Secondary | ICD-10-CM

## 2019-11-23 LAB — CBC WITH DIFFERENTIAL/PLATELET
Abs Immature Granulocytes: 0.05 10*3/uL (ref 0.00–0.07)
Basophils Absolute: 0 10*3/uL (ref 0.0–0.1)
Basophils Relative: 0 %
Eosinophils Absolute: 0 10*3/uL (ref 0.0–0.5)
Eosinophils Relative: 0 %
HCT: 44.3 % (ref 36.0–46.0)
Hemoglobin: 15.2 g/dL — ABNORMAL HIGH (ref 12.0–15.0)
Immature Granulocytes: 0 %
Lymphocytes Relative: 5 %
Lymphs Abs: 0.7 10*3/uL (ref 0.7–4.0)
MCH: 28.8 pg (ref 26.0–34.0)
MCHC: 34.3 g/dL (ref 30.0–36.0)
MCV: 84.1 fL (ref 80.0–100.0)
Monocytes Absolute: 0.8 10*3/uL (ref 0.1–1.0)
Monocytes Relative: 5 %
Neutro Abs: 14.1 10*3/uL — ABNORMAL HIGH (ref 1.7–7.7)
Neutrophils Relative %: 90 %
Platelets: 454 10*3/uL — ABNORMAL HIGH (ref 150–400)
RBC: 5.27 MIL/uL — ABNORMAL HIGH (ref 3.87–5.11)
RDW: 13.2 % (ref 11.5–15.5)
WBC: 15.6 10*3/uL — ABNORMAL HIGH (ref 4.0–10.5)
nRBC: 0 % (ref 0.0–0.2)

## 2019-11-23 LAB — COMPREHENSIVE METABOLIC PANEL
ALT: 29 U/L (ref 0–44)
AST: 24 U/L (ref 15–41)
Albumin: 3.8 g/dL (ref 3.5–5.0)
Alkaline Phosphatase: 53 U/L (ref 38–126)
Anion gap: 13 (ref 5–15)
BUN: 14 mg/dL (ref 8–23)
CO2: 24 mmol/L (ref 22–32)
Calcium: 8.7 mg/dL — ABNORMAL LOW (ref 8.9–10.3)
Chloride: 103 mmol/L (ref 98–111)
Creatinine, Ser: 0.79 mg/dL (ref 0.44–1.00)
GFR calc Af Amer: >60 mL/min (ref >60)
GFR calc non Af Amer: >60 mL/min (ref >60)
Glucose, Bld: 123 mg/dL — ABNORMAL HIGH (ref 70–99)
Potassium: 3.9 mmol/L (ref 3.5–5.1)
Sodium: 140 mmol/L (ref 135–145)
Total Bilirubin: 0.4 mg/dL (ref 0.3–1.2)
Total Protein: 7.1 g/dL (ref 6.5–8.1)

## 2019-11-23 LAB — MAGNESIUM: Magnesium: 2.1 mg/dL (ref 1.7–2.4)

## 2019-11-23 LAB — TSH: TSH: 0.412 u[IU]/mL (ref 0.350–4.500)

## 2019-11-23 MED ORDER — IOHEXOL 300 MG/ML  SOLN
100.0000 mL | Freq: Once | INTRAMUSCULAR | Status: AC | PRN
  Administered 2019-11-23: 100 mL via INTRAVENOUS

## 2019-11-23 MED ORDER — SODIUM CHLORIDE 0.9 % IV SOLN
1.5000 g | Freq: Four times a day (QID) | INTRAVENOUS | Status: DC
  Administered 2019-11-23 – 2019-11-24 (×5): 1.5 g via INTRAVENOUS
  Filled 2019-11-23: qty 1.5
  Filled 2019-11-23 (×4): qty 4
  Filled 2019-11-23 (×2): qty 1.5
  Filled 2019-11-23: qty 4

## 2019-11-23 MED ORDER — IOHEXOL 9 MG/ML PO SOLN
500.0000 mL | ORAL | Status: AC
  Administered 2019-11-23 (×2): 500 mL via ORAL

## 2019-11-23 NOTE — Progress Notes (Signed)
PROGRESS NOTE    Wanda Ross  BSJ:[id number] DOB: [birth year] DOA: 11/21/2019 PCP: Fayrene Helper, MD   Brief Narrative:  Per HPI: Wanda Ross  is a 69 y.o. female, with history of glaucoma, hyperlipidemia, hypertension was brought to the ED due to confusion and gait imbalance.  As per patient daughter, patient was having intermittent confusion.  Patient denies chest pain, shortness of breath, nausea, vomiting or diarrhea.  Denies blurred vision.  No speech disturbance. Patient complained of headache, poor appetite for past 2 weeks.  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 Denies abdominal pain or dysuria Denies weight loss No previous history of malignancy In the ED CT of the head was obtained which showed ill-defined 3.2 cm mass within the left parietal white matter, compatible malignancy.  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  Extensive vasogenic edema noted throughout the left cerebral hemisphere with significant sulcal effacement and minimal rightward midline shift. MRI brain with and without contrast recommended. Neurosurgeon Dr. Marcello Moores was consulted by ED physician who recommended Decadron, MRI brain with and without contrast and transfer to [place]:   Active Problems:   Brain mass   Brain mass   1. Brain mass-CT head shows brain mass with vasogenic edema throughout the left cerebral hemisphere with minimal rightward midline shift.  Decadron 10 mg IV given in the ED.  C/W Decadron 6 mg p.o. every 6 hours as per neurosurgery recommendation.,  MRI with and without, CT chest abdomen pelvis, further recommendation per neurosurgery. 2. Abnormal UA-patient found to have abnormal UA, denies dysuria.  Change antibiotics to Unasyn 3.   Hypertension-continue amlodipine, Catapres.    Titrate medications back in as dictated by BP recent 130/78 previously 118/71 4.  hyperlipidemia-continue pravastatin 5. Hypokalemia-resolved, monitor  DVT prophylaxis:  SCD/Compression stockings  Code Status: Full code    Code Status Orders  (From admission, onward)         Start     Ordered   11/22/19 0358  Full code  Continuous        11/22/19 0357        Code Status History    This patient has a current code status but no historical code status.   Advance Care Planning Activity     Family Communication: Discussed with niece at bedside Disposition Plan:    Status is: Inpatient  Remains inpatient appropriate because:Ongoing diagnostic testing needed not appropriate for outpatient work up   Dispo: The patient is from: Home              Anticipated d/c is to: TBD              Anticipated d/c date is: 2 days              Patient currently is not medically stable to d/c.       Consults called: Neurosurgery Admission status: Inpatient   Consultants:   As above  Procedures:  DG Chest 2 View  Result Date: 11/22/2019 CLINICAL DATA:  Encephalopathy.  Newly discovered brain mass. EXAM: CHEST - 2 VIEW COMPARISON:  None. FINDINGS: The heart size and mediastinal contours are within normal limits. Both lungs are clear. The visualized skeletal structures are unremarkable. IMPRESSION: No active cardiopulmonary disease. Electronically Signed   By: Ulyses Jarred M.D.   On: 11/22/2019 04:03   CT Head Wo Contrast  Result Date: 11/22/2019 CLINICAL DATA:  Altered level of consciousness for 4 days, dizziness EXAM: CT HEAD WITHOUT  CONTRAST TECHNIQUE: Contiguous axial images were obtained from the base of the skull through the vertex without intravenous contrast. COMPARISON:  None. FINDINGS: Brain: There is significant vasogenic edema in the left frontal parietal region. 3.2 cm mass is seen within the left parietal white matter. MRI with and without contrast is recommended for further evaluation. Hypodensity extends along the posterior aspect of the corpus callosum, either extension of the mass or edema. I do not see any definite hemorrhage. There is  significant effacement of the left lateral ventricle, with rightward midline shift measuring up to 4 mm at the septum pellucidum. There is diffuse sulcal effacement throughout the left cerebral hemisphere, greatest along the convexity. No acute extra-axial fluid collections. Vascular: No hyperdense vessel or unexpected calcification. Skull: Normal. Negative for fracture or focal lesion. Sinuses/Orbits: Small metallic densities are seen within the globes bilaterally, likely related to prior glaucoma surgery. Please correlate with surgical history. Otherwise the orbits are unremarkable. Other: None. IMPRESSION: 1. 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 MRI with and without contrast is recommended. 2. Extensive vasogenic edema throughout the left cerebral hemisphere, with significant sulcal effacement and minimal rightward midline shift. Significant effacement of the left lateral ventricle. These results were called by telephone at the time of interpretation on 11/22/2019 at 12:34 am to provider IVA KNAPP , who verbally acknowledged these results. 1. Extensive cerebral edema Electronically Signed   By: Randa Ngo M.D.   On: 11/22/2019 00:34   MR BRAIN W WO CONTRAST  Result Date: 11/23/2019 CLINICAL DATA:  Intracranial mass EXAM: MRI HEAD WITHOUT AND WITH CONTRAST TECHNIQUE: Multiplanar, multiecho pulse sequences of the brain and surrounding structures were obtained without and with intravenous contrast. CONTRAST:  33mL GADAVIST GADOBUTROL 1 MMOL/ML IV SOLN COMPARISON:  None. FINDINGS: Brain: There is a peripherally and heterogeneously contrast-enhancing mass within the left parietal white matter extending along the corpus callosum splenium. Mass measures 4.8 x 3.3 x 3.0 cm. There is moderate surrounding hyperintense T2-weighted signal a crosses into the right hemisphere. Rightward midline  shift measures 3 mm. There is petechial hemorrhage within the mass. Vascular: Normal flow voids. Skull and upper cervical spine: Normal marrow signal. Sinuses/Orbits: Negative. Other: None. IMPRESSION: 1. Heterogeneously contrast-enhancing mass within the left parietal white matter extending along the corpus callosum splenium, most consistent with high-grade glioma. 2. Moderate surrounding hyperintense T2-weighted signal, likely a combination of edema and nonenhancing tumor. 3. 3 mm of rightward midline shift. 4. Intratumoral petechial hemorrhage. Electronically Signed   By: Ulyses Jarred M.D.   On: 11/23/2019 00:09     Antimicrobials:   Ampicillin day 1, previously 1 day of ceftriaxone   Subjective: Patient transferred from [facility] secondary to brain mass. Symptoms have improved with steroids  Objective: Vitals:   11/23/19 0024 11/23/19 0435 11/23/19 0735 11/23/19 1130  BP: 127/73 116/75 118/71 130/78  Pulse: (!) 107 91 86 69  Resp: 18 16 16 16   Temp: 98.1 F (36.7 C) 97.7 F (36.5 C) 98.1 F (36.7 C) 97.6 F (36.4 C)  TempSrc: Oral Oral Oral Oral  SpO2: 94% 98% 98% 99%  Weight:      Height:        Intake/Output Summary (Last 24 hours) at 11/23/2019 1321 Last data filed at 11/23/2019 0036 Gross per 24 hour  Intake 240 ml  Output 750 ml  Net -510 ml   Filed Weights   11/21/19  2104  Weight: 68.9 kg    Examination:  General exam: Appears calm and comfortable  Respiratory system: Clear to auscultation. Respiratory effort normal. Cardiovascular system: S1 & S2 heard, RRR. No JVD, murmurs, rubs, gallops or clicks. No pedal edema. Gastrointestinal system: Abdomen is nondistended, soft and nontender. No organomegaly or masses felt. Normal bowel sounds heard. Central nervous system: Alert and oriented.  Right upper extremity 4+ out of 5, right lower extremity 4+ out of 5. Extremities: Perfused no edema. Skin: No rashes, lesions or ulcers Psychiatry: Judgement and  insight appear normal. Mood & affect appropriate.     Data Reviewed: I have personally reviewed following labs and imaging studies  CBC: Recent Labs  Lab 11/21/19 2137 11/22/19 0537 11/23/19 0837  WBC 7.1 6.3 15.6*  NEUTROABS 5.1  --  14.1*  HGB 14.8 14.8 15.2*  HCT 43.5 43.8 44.3  MCV 84.8 84.6 84.1  PLT 356 377 539*   Basic Metabolic Panel: Recent Labs  Lab 11/21/19 2137 11/22/19 0537 11/23/19 0837  NA 136 140 140  K 2.8* 4.0 3.9  CL 99 104 103  CO2 27 25 24   GLUCOSE 155* 138* 123*  BUN 12 10 14   CREATININE 0.83 0.65 0.79  CALCIUM 8.8* 9.0 8.7*  MG  --  1.9 2.1   GFR: Estimated Creatinine Clearance: 65.1 mL/min (by C-G formula based on SCr of 0.79 mg/dL). Liver Function Tests: Recent Labs  Lab 11/21/19 2137 11/22/19 0537 11/23/19 0837  AST 37 32 24  ALT 35 34 29  ALKPHOS 52 53 53  BILITOT 0.4 0.7 0.4  PROT 7.2 7.4 7.1  ALBUMIN 4.3 4.4 3.8   No results for input(s): LIPASE, AMYLASE in the last 168 hours. No results for input(s): AMMONIA in the last 168 hours. Coagulation Profile: No results for input(s): INR, PROTIME in the last 168 hours. Cardiac Enzymes: No results for input(s): CKTOTAL, CKMB, CKMBINDEX, TROPONINI in the last 168 hours. BNP (last 3 results) No results for input(s): PROBNP in the last 8760 hours. HbA1C: No results for input(s): HGBA1C in the last 72 hours. CBG: No results for input(s): GLUCAP in the last 168 hours. Lipid Profile: No results for input(s): CHOL, HDL, LDLCALC, TRIG, CHOLHDL, LDLDIRECT in the last 72 hours. Thyroid Function Tests: Recent Labs    11/23/19 0837  TSH 0.412   Anemia Panel: No results for input(s): VITAMINB12, FOLATE, FERRITIN, TIBC, IRON, RETICCTPCT in the last 72 hours. Sepsis Labs: No results for input(s): PROCALCITON, LATICACIDVEN in the last 168 hours.  Recent Results (from the past 240 hour(s))  Urine culture     Status: Abnormal (Preliminary result)   Collection Time: 11/21/19 10:48 PM    Specimen: Urine, Clean Catch  Result Value Ref Range Status   Specimen Description   Final    URINE, CLEAN CATCH Performed at [facility], [address], [place], [place] [address]    Special Requests   Final    NONE Performed at [facility], [address], [place], [place] [address]    Culture (A)  Final    60,000 COLONIES/mL GROUP B STREP(S.AGALACTIAE)ISOLATED TESTING AGAINST S. AGALACTIAE NOT ROUTINELY PERFORMED DUE TO PREDICTABILITY OF AMP/PEN/VAN SUSCEPTIBILITY. Performed at [facility], [place] [address] [place], [place] [address]    Report Status PENDING  Incomplete  SARS Coronavirus 2 by RT PCR (hospital order, performed in [facility] hospital lab) Nasopharyngeal Nasopharyngeal Swab     Status: None   Collection Time: 11/22/19 12:41 AM   Specimen:  Nasopharyngeal Swab  Result Value Ref Range Status   SARS Coronavirus 2 NEGATIVE NEGATIVE Final    Comment: (NOTE) SARS-CoV-2 target nucleic acids are NOT DETECTED.  The SARS-CoV-2 RNA is generally detectable in upper and lower respiratory specimens during the acute phase of infection. The lowest concentration of SARS-CoV-2 viral copies this assay can detect is 250 copies / mL. A negative result does not preclude SARS-CoV-2 infection and should not be used as the sole basis for treatment or other patient management decisions.  A negative result may occur with improper specimen collection / handling, submission of specimen other than nasopharyngeal swab, presence of viral mutation(s) within the areas targeted by this assay, and inadequate number of viral copies (<250 copies / mL). A negative result must be combined with clinical observations, patient history, and epidemiological information.  Fact Sheet for Patients:   StrictlyIdeas.no  Fact Sheet for Healthcare Providers: BankingDealers.co.za  This test is not yet approved or  cleared by the [place] FDA and has  been authorized for detection and/or diagnosis of SARS-CoV-2 by FDA under an Emergency Use Authorization (EUA).  This EUA will remain in effect (meaning this test can be used) for the duration of the COVID-19 declaration under Section 564(b)(1) of the Act, 21 U.S.C. section 360bbb-3(b)(1), unless the authorization is terminated or revoked sooner.  Performed at [facility], [address] [place],  [address]          Radiology Studies: DG Chest 2 View  Result Date: 11/22/2019 CLINICAL DATA:  Encephalopathy.  Newly discovered brain mass. EXAM: CHEST - 2 VIEW COMPARISON:  None. FINDINGS: The heart size and mediastinal contours are within normal limits. Both lungs are clear. The visualized skeletal structures are unremarkable. IMPRESSION: No active cardiopulmonary disease. Electronically Signed   By: Ulyses Jarred M.D.   On: 11/22/2019 04:03   CT Head Wo Contrast  Result Date: 11/22/2019 CLINICAL DATA:  Altered level of consciousness for 4 days, dizziness EXAM: CT HEAD WITHOUT CONTRAST TECHNIQUE: Contiguous axial images were obtained from the base of the skull through the vertex without intravenous contrast. COMPARISON:  None. FINDINGS: Brain: There is significant vasogenic edema in the left frontal parietal region. 3.2 cm mass is seen within the left parietal white matter. MRI with and without contrast is recommended for further evaluation. Hypodensity extends along the posterior aspect of the corpus callosum, either extension of the mass or edema. I do not see any definite hemorrhage. There is significant effacement of the left lateral ventricle, with rightward midline shift measuring up to 4 mm at the septum pellucidum. There is diffuse sulcal effacement throughout the left cerebral hemisphere, greatest along the convexity. No acute extra-axial fluid collections. Vascular: No hyperdense vessel or unexpected calcification. Skull: Normal. Negative for fracture or focal lesion.  Sinuses/Orbits: Small metallic densities are seen within the globes bilaterally, likely related to prior glaucoma surgery. Please correlate with surgical history. Otherwise the orbits are unremarkable. Other: None. IMPRESSION: 1. 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 MRI with and without contrast is recommended. 2. Extensive vasogenic edema throughout the left cerebral hemisphere, with significant sulcal effacement and minimal rightward midline shift. Significant effacement of the left lateral ventricle. These results were called by telephone at the time of interpretation on 11/22/2019 at 12:34 am to provider IVA KNAPP , who verbally acknowledged these results. 1. Extensive cerebral edema Electronically Signed   By: Legrand Como  Owens Shark M.D.   On: 11/22/2019 00:34   MR BRAIN W WO CONTRAST  Result Date: 11/23/2019 CLINICAL DATA:  Intracranial mass EXAM: MRI HEAD WITHOUT AND WITH CONTRAST TECHNIQUE: Multiplanar, multiecho pulse sequences of the brain and surrounding structures were obtained without and with intravenous contrast. CONTRAST:  19mL GADAVIST GADOBUTROL 1 MMOL/ML IV SOLN COMPARISON:  None. FINDINGS: Brain: There is a peripherally and heterogeneously contrast-enhancing mass within the left parietal white matter extending along the corpus callosum splenium. Mass measures 4.8 x 3.3 x 3.0 cm. There is moderate surrounding hyperintense T2-weighted signal a crosses into the right hemisphere. Rightward midline shift measures 3 mm. There is petechial hemorrhage within the mass. Vascular: Normal flow voids. Skull and upper cervical spine: Normal marrow signal. Sinuses/Orbits: Negative. Other: None. IMPRESSION: 1. Heterogeneously contrast-enhancing mass within the left parietal white matter extending along the corpus callosum splenium, most consistent with high-grade glioma. 2. Moderate surrounding  hyperintense T2-weighted signal, likely a combination of edema and nonenhancing tumor. 3. 3 mm of rightward midline shift. 4. Intratumoral petechial hemorrhage. Electronically Signed   By: Ulyses Jarred M.D.   On: 11/23/2019 00:09        Scheduled Meds: . amLODipine  5 mg Oral Daily  . cloNIDine  0.3 mg Oral QHS  . dexamethasone  4 mg Oral BID  . dorzolamide-timolol  1 drop Right Eye BID  . pravastatin  10 mg Oral q1800  . sodium chloride flush  3 mL Intravenous Q12H   Continuous Infusions: . sodium chloride    . cefTRIAXone (ROCEPHIN)  IV 1 g (11/23/19 1771)     LOS: 1 day    Time spent: [place]    Nicolette Bang, MD [facility]  If 7PM-7AM, please contact night-coverage  11/23/2019, 1:21 PM

## 2019-11-23 NOTE — Progress Notes (Signed)
Patient ID: Wanda Ross, female   DOB: 1950/10/06, 69 y.o.   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  This would likely give her a visual field cut on the right side.  Nonetheless it is not apparent patient notes that she has had multiple visual field exams in the past secondary to her glaucoma.  I discussed with the her daughter and her son who are present the nature of the glioma fact that she needs surgery to decompress this so we can identify the exact cell type and then likely she will need additional treatments I noted to her that this lesion is not curable but is certainly treatable and I noted a lot depends on the exact grade although I suspect that this is in the grade 2-3 possibly even a 4 of gliomas.  For the current time we will continue to monitor her on the Decadron.  We will plan further care for the near future.

## 2019-11-24 ENCOUNTER — Other Ambulatory Visit: Payer: Self-pay | Admitting: Neurosurgery

## 2019-11-24 DIAGNOSIS — R531 Weakness: Secondary | ICD-10-CM

## 2019-11-24 LAB — CBC WITH DIFFERENTIAL/PLATELET
Abs Immature Granulocytes: 0.07 10*3/uL (ref 0.00–0.07)
Basophils Absolute: 0 10*3/uL (ref 0.0–0.1)
Basophils Relative: 0 %
Eosinophils Absolute: 0 10*3/uL (ref 0.0–0.5)
Eosinophils Relative: 0 %
HCT: 40.2 % (ref 36.0–46.0)
Hemoglobin: 13.6 g/dL (ref 12.0–15.0)
Immature Granulocytes: 1 %
Lymphocytes Relative: 6 %
Lymphs Abs: 0.9 10*3/uL (ref 0.7–4.0)
MCH: 28.5 pg (ref 26.0–34.0)
MCHC: 33.8 g/dL (ref 30.0–36.0)
MCV: 84.3 fL (ref 80.0–100.0)
Monocytes Absolute: 0.8 10*3/uL (ref 0.1–1.0)
Monocytes Relative: 6 %
Neutro Abs: 12.2 10*3/uL — ABNORMAL HIGH (ref 1.7–7.7)
Neutrophils Relative %: 87 %
Platelets: 382 10*3/uL (ref 150–400)
RBC: 4.77 MIL/uL (ref 3.87–5.11)
RDW: 13.3 % (ref 11.5–15.5)
WBC: 13.9 10*3/uL — ABNORMAL HIGH (ref 4.0–10.5)
nRBC: 0 % (ref 0.0–0.2)

## 2019-11-24 LAB — URINE CULTURE: Culture: 60000 — AB

## 2019-11-24 LAB — BASIC METABOLIC PANEL
Anion gap: 10 (ref 5–15)
BUN: 16 mg/dL (ref 8–23)
CO2: 24 mmol/L (ref 22–32)
Calcium: 8.2 mg/dL — ABNORMAL LOW (ref 8.9–10.3)
Chloride: 106 mmol/L (ref 98–111)
Creatinine, Ser: 0.83 mg/dL (ref 0.44–1.00)
GFR calc Af Amer: >60 mL/min (ref >60)
GFR calc non Af Amer: >60 mL/min (ref >60)
Glucose, Bld: 126 mg/dL — ABNORMAL HIGH (ref 70–99)
Potassium: 3.7 mmol/L (ref 3.5–5.1)
Sodium: 140 mmol/L (ref 135–145)

## 2019-11-24 MED ORDER — CLINDAMYCIN HCL 300 MG PO CAPS
300.0000 mg | ORAL_CAPSULE | Freq: Two times a day (BID) | ORAL | Status: DC
  Administered 2019-11-24 – 2019-11-25 (×3): 300 mg via ORAL
  Filled 2019-11-24 (×4): qty 1

## 2019-11-24 NOTE — Progress Notes (Signed)
Subjective: Patient reports no headaches  Objective: Vital signs in last 24 hours: Temp:  [97.6 F (36.4 C)-98.2 F (36.8 C)] 98 F (36.7 C) (06/28 0318) Pulse Rate:  [60-91] 60 (06/28 0318) Resp:  [16-18] 16 (06/28 0318) BP: (122-146)/(64-78) 122/64 (06/28 0318) SpO2:  [97 %-99 %] 97 % (06/28 0318)  Intake/Output from previous day: 06/27 0701 - 06/28 0700 In: 300 [IV Piggyback:300] Out: -  Intake/Output this shift: No intake/output data recorded.  Some mild speech slowing, mild dyscalculia and finger agnosia.  Lab Results: Recent Labs    11/23/19 0837 11/24/19 0306  WBC 15.6* 13.9*  HGB 15.2* 13.6  HCT 44.3 40.2  PLT 454* 382   BMET Recent Labs    11/23/19 0837 11/24/19 0306  NA 140 140  K 3.9 3.7  CL 103 106  CO2 24 24  GLUCOSE 123* 126*  BUN 14 16  CREATININE 0.79 0.83  CALCIUM 8.7* 8.2*    Studies/Results: CT CHEST W CONTRAST  Result Date: 11/23/2019 CLINICAL DATA:  69 y.o. female was brought to the ED due to confusion and gait imbalance. As per patient daughter, patient was having intermittent confusion. Patient denies chest pain, shortness of breath, nausea, vomiting or diarrhea. Mass seen on CT Head and Mri. EXAM: CT CHEST, ABDOMEN, AND PELVIS WITH CONTRAST TECHNIQUE: Multidetector CT imaging of the chest, abdomen and pelvis was performed following the standard protocol during bolus administration of intravenous contrast. CONTRAST:  136mL OMNIPAQUE IOHEXOL 300 MG/ML  SOLN COMPARISON:  Head CT and brain MRI from 11/22/2019 FINDINGS: CT CHEST FINDINGS Cardiovascular: Heart is normal in size and configuration. No pericardial effusion. No coronary artery calcifications. Great vessels are normal in caliber. No aortic atherosclerosis. Mediastinum/Nodes: No enlarged mediastinal, hilar, or axillary lymph nodes. Thyroid gland, trachea, and esophagus demonstrate no significant findings. Lungs/Pleura: Minor scarring at the base of the left upper lobe lingula. Minimal  linear dependent lower lobe opacities consistent with atelectasis. Lungs otherwise clear with no masses or nodules. No pleural effusion or pneumothorax. Musculoskeletal: No fracture or acute finding. No osteoblastic or osteolytic lesions. CT ABDOMEN PELVIS FINDINGS Hepatobiliary: Liver normal in size and overall attenuation. Small low-density lesion between the anterior segment the right lobe and medial segment of the left, consistent with a cyst. Small amount of focal fat adjacent to the falciform ligament. No other abnormalities. Normal gallbladder. No bile duct dilation. Pancreas: Unremarkable. No pancreatic ductal dilatation or surrounding inflammatory changes. Spleen: Normal in size without focal abnormality. Adrenals/Urinary Tract: No adrenal masses. Kidneys normal in size, orientation and position with symmetric enhancement and excretion. Bilateral low-attenuation renal masses consistent with cysts, largest from the lower pole of the left kidney, 4.5 cm. No renal stones. No hydronephrosis. Normal ureters. Normal bladder. Stomach/Bowel: Small hiatal hernia. Stomach is otherwise within normal limits. Appendix appears normal. No evidence of bowel wall thickening, distention, or inflammatory changes. Vascular/Lymphatic: No significant vascular abnormality. No enlarged lymph nodes. Reproductive: Uterus and bilateral adnexa are unremarkable. Other: No abdominal wall hernia or abnormality. No abdominopelvic ascites. Musculoskeletal: No fracture or acute finding. No osteoblastic or osteolytic lesions. IMPRESSION: 1. No acute findings within the chest, abdomen or pelvis. 2. No evidence of a primary malignancy within the chest, abdomen or pelvis. 3. Small low-density liver lesion consistent with a cyst. Bilateral renal cysts. Electronically Signed   By: Lajean Manes M.D.   On: 11/23/2019 14:38   MR BRAIN W WO CONTRAST  Result Date: 11/23/2019 CLINICAL DATA:  Intracranial mass EXAM: MRI HEAD WITHOUT AND  WITH  CONTRAST TECHNIQUE: Multiplanar, multiecho pulse sequences of the brain and surrounding structures were obtained without and with intravenous contrast. CONTRAST:  87mL GADAVIST GADOBUTROL 1 MMOL/ML IV SOLN COMPARISON:  None. FINDINGS: Brain: There is a peripherally and heterogeneously contrast-enhancing mass within the left parietal white matter extending along the corpus callosum splenium. Mass measures 4.8 x 3.3 x 3.0 cm. There is moderate surrounding hyperintense T2-weighted signal a crosses into the right hemisphere. Rightward midline shift measures 3 mm. There is petechial hemorrhage within the mass. Vascular: Normal flow voids. Skull and upper cervical spine: Normal marrow signal. Sinuses/Orbits: Negative. Other: None. IMPRESSION: 1. Heterogeneously contrast-enhancing mass within the left parietal white matter extending along the corpus callosum splenium, most consistent with high-grade glioma. 2. Moderate surrounding hyperintense T2-weighted signal, likely a combination of edema and nonenhancing tumor. 3. 3 mm of rightward midline shift. 4. Intratumoral petechial hemorrhage. Electronically Signed   By: Ulyses Jarred M.D.   On: 11/23/2019 00:09   CT ABDOMEN PELVIS W CONTRAST  Result Date: 11/23/2019 CLINICAL DATA:  69 y.o. female was brought to the ED due to confusion and gait imbalance. As per patient daughter, patient was having intermittent confusion. Patient denies chest pain, shortness of breath, nausea, vomiting or diarrhea. Mass seen on CT Head and Mri. EXAM: CT CHEST, ABDOMEN, AND PELVIS WITH CONTRAST TECHNIQUE: Multidetector CT imaging of the chest, abdomen and pelvis was performed following the standard protocol during bolus administration of intravenous contrast. CONTRAST:  164mL OMNIPAQUE IOHEXOL 300 MG/ML  SOLN COMPARISON:  Head CT and brain MRI from 11/22/2019 FINDINGS: CT CHEST FINDINGS Cardiovascular: Heart is normal in size and configuration. No pericardial effusion. No coronary artery  calcifications. Great vessels are normal in caliber. No aortic atherosclerosis. Mediastinum/Nodes: No enlarged mediastinal, hilar, or axillary lymph nodes. Thyroid gland, trachea, and esophagus demonstrate no significant findings. Lungs/Pleura: Minor scarring at the base of the left upper lobe lingula. Minimal linear dependent lower lobe opacities consistent with atelectasis. Lungs otherwise clear with no masses or nodules. No pleural effusion or pneumothorax. Musculoskeletal: No fracture or acute finding. No osteoblastic or osteolytic lesions. CT ABDOMEN PELVIS FINDINGS Hepatobiliary: Liver normal in size and overall attenuation. Small low-density lesion between the anterior segment the right lobe and medial segment of the left, consistent with a cyst. Small amount of focal fat adjacent to the falciform ligament. No other abnormalities. Normal gallbladder. No bile duct dilation. Pancreas: Unremarkable. No pancreatic ductal dilatation or surrounding inflammatory changes. Spleen: Normal in size without focal abnormality. Adrenals/Urinary Tract: No adrenal masses. Kidneys normal in size, orientation and position with symmetric enhancement and excretion. Bilateral low-attenuation renal masses consistent with cysts, largest from the lower pole of the left kidney, 4.5 cm. No renal stones. No hydronephrosis. Normal ureters. Normal bladder. Stomach/Bowel: Small hiatal hernia. Stomach is otherwise within normal limits. Appendix appears normal. No evidence of bowel wall thickening, distention, or inflammatory changes. Vascular/Lymphatic: No significant vascular abnormality. No enlarged lymph nodes. Reproductive: Uterus and bilateral adnexa are unremarkable. Other: No abdominal wall hernia or abnormality. No abdominopelvic ascites. Musculoskeletal: No fracture or acute finding. No osteoblastic or osteolytic lesions. IMPRESSION: 1. No acute findings within the chest, abdomen or pelvis. 2. No evidence of a primary malignancy  within the chest, abdomen or pelvis. 3. Small low-density liver lesion consistent with a cyst. Bilateral renal cysts. Electronically Signed   By: Lajean Manes M.D.   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

## 2019-11-24 NOTE — Plan of Care (Signed)
  Problem: Clinical Measurements: Goal: Ability to maintain clinical measurements within normal limits will improve Outcome: Progressing   

## 2019-11-24 NOTE — Progress Notes (Addendum)
[facility]                                                                              Patient Demographics  Shakima Nisley, is a 69 y.o. female, DOB - [birth year], TJQ:[id number]  Admit date - 11/21/2019   Admitting Physician Oswald Hillock, MD  Outpatient Primary MD for the patient is Fayrene Helper, MD  Outpatient specialists:   LOS - 2  days   Medical records reviewed and are as summarized below:    Chief Complaint  Patient presents with  . Altered Mental Status       Brief summary   Per HPI: [name] a47 y.o.female,with history of glaucoma, hyperlipidemia, hypertension was brought to the ED due to confusion and gait imbalance. As per patient daughter, patient was having intermittent confusion.Patient denies chest pain, shortness of breath, nausea, vomiting or diarrhea. Denies blurred vision. No speech disturbance. Patient complained of headache, poor appetite for past 2 weeks. 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 Denies abdominal pain or dysuria, Denies weight loss. No previous history of malignancy In the ED CT of the head was obtained which showed ill-defined 3.2 cm mass within the left parietal white matter, compatible malignancy. 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 Extensive vasogenic edema noted throughout the left cerebral hemisphere with significant sulcal effacement and minimal rightward midline shift. Neurosurgeon Dr. Marcello Moores was consulted by ED physician who recommended Decadron, MRI brain with and without contrast and transfer to [place]    Brain mass/glioma-new diagnosis -CT head showed brain mass with vasogenic edema throughout the left cerebral hemisphere with minimal rightward midline shift -Currently no acute neurological deficits, visual field cut, neurosurgery following -Continue Decadron -Plan for surgery on Wednesday, 11/26/2019 -CT chest abdomen and pelvis showed no acute findings  within the chest abdomen and pelvis, no evidence of primary malignancy  Abnormal UA -Urine culture showed 60,000 colonies staph epidermidis - Currently on Unasyn  Essential hypertension Continue amlodipine, Catapres  Hyperlipidemia Continue pravastatin   Code Status: Full DVT Prophylaxis:  SCD's Family Communication: Discussed all imaging results, lab results, explained to the patient.  Called patient's sister, unable to make contact, left detailed message on the phone.   Disposition Plan:     Status is: Inpatient  Remains inpatient appropriate because:Inpatient level of care appropriate due to severity of illness   Dispo: The patient is from: Home              Anticipated d/c is to: TBD              Anticipated d/c date is: > 3 days              Patient currently is not medically stable to d/c.      Time Spent in minutes 25 minutes  Procedures:  None  Consultants:   Neurosurgery  Antimicrobials:   Anti-infectives (From admission, onward)   Start     Dose/Rate Route Frequency Ordered Stop   11/23/19 1400  ampicillin-sulbactam (UNASYN) 1.5 g in sodium chloride 0.9 % 100 mL IVPB     Discontinue     1.5  g 200 mL/hr over 30 Minutes Intravenous Every 6 hours 11/23/19 1325     11/22/19 0800  cefTRIAXone (ROCEPHIN) 1 g in sodium chloride 0.9 % 100 mL IVPB  Status:  Discontinued        1 g 200 mL/hr over 30 Minutes Intravenous Every 24 hours 11/22/19 0320 11/22/19 0444   11/22/19 0445  cefTRIAXone (ROCEPHIN) 1 g in sodium chloride 0.9 % 100 mL IVPB  Status:  Discontinued        1 g 200 mL/hr over 30 Minutes Intravenous Every 24 hours 11/22/19 0444 11/23/19 1325   11/21/19 2345  cephALEXin (KEFLEX) capsule 500 mg        500 mg Oral  Once 11/21/19 2330 11/22/19 0034          Medications  Scheduled Meds: . amLODipine  5 mg Oral Daily  . cloNIDine  0.3 mg Oral QHS  . dexamethasone  4 mg Oral BID  . dorzolamide-timolol  1 drop Right Eye BID  . pravastatin  10  mg Oral q1800  . sodium chloride flush  3 mL Intravenous Q12H   Continuous Infusions: . sodium chloride    . ampicillin-sulbactam (UNASYN) IV 1.5 g (11/24/19 0857)   PRN Meds:.sodium chloride, acetaminophen **OR** acetaminophen, ondansetron **OR** ondansetron (ZOFRAN) IV, sodium chloride flush      Subjective:   Jennell Janosik was seen and examined today.  No complaints, no headache or blurry vision. Patient denies dizziness, chest pain, shortness of breath, abdominal pain, N/V/D/C, new weakness, numbess, tingling. No acute events overnight.    Objective:   Vitals:   11/23/19 1930 11/24/19 0007 11/24/19 0318 11/24/19 0857  BP: 128/70 (!) 146/73 122/64 112/78  Pulse: 83 87 60 75  Resp: 16 18 16 16   Temp: 98.1 F (36.7 C) 98.2 F (36.8 C) 98 F (36.7 C) 97.7 F (36.5 C)  TempSrc: Oral Oral  Oral  SpO2: 97% 99% 97% 100%  Weight:      Height:        Intake/Output Summary (Last 24 hours) at 11/24/2019 1037 Last data filed at 11/24/2019 0600 Gross per 24 hour  Intake 300 ml  Output --  Net 300 ml     Wt Readings from Last 3 Encounters:  11/21/19 68.9 kg  10/22/19 68.9 kg  06/04/19 68.9 kg     Exam  General: Alert and oriented x 3, NAD  Cardiovascular: S1 S2 auscultated, no murmurs, RRR  Respiratory: Clear to auscultation bilaterally, no wheezing, rales or rhonchi  Gastrointestinal: Soft, nontender, nondistended, + bowel sounds  Ext: no pedal edema bilaterally  Neuro: Mild right-sided weakness  Musculoskeletal: No digital cyanosis, clubbing  Skin: No rashes  Psych: Normal affect and demeanor, alert and oriented x3    Data Reviewed:  I have personally reviewed following labs and imaging studies  Micro Results Recent Results (from the past 240 hour(s))  Urine culture     Status: Abnormal   Collection Time: 11/21/19 10:48 PM   Specimen: Urine, Clean Catch  Result Value Ref Range Status   Specimen Description   Final    URINE, CLEAN  CATCH Performed at [facility], [address] [place], [place] [address]    Special Requests   Final    NONE Performed at [facility], [address] [place], [place] [address]    Culture (A)  Final    60,000 COLONIES/mL GROUP B STREP(S.AGALACTIAE)ISOLATED TESTING AGAINST S. AGALACTIAE NOT ROUTINELY PERFORMED DUE TO PREDICTABILITY OF AMP/PEN/VAN SUSCEPTIBILITY. 30,000 COLONIES/mL  STAPHYLOCOCCUS EPIDERMIDIS    Report Status 11/24/2019 FINAL  Final   Organism ID, Bacteria STAPHYLOCOCCUS EPIDERMIDIS (A)  Final      Susceptibility   Staphylococcus epidermidis - MIC*    CIPROFLOXACIN <=0.5 SENSITIVE Sensitive     GENTAMICIN <=0.5 SENSITIVE Sensitive     NITROFURANTOIN <=16 SENSITIVE Sensitive     OXACILLIN >=4 RESISTANT Resistant     TETRACYCLINE <=1 SENSITIVE Sensitive     VANCOMYCIN 2 SENSITIVE Sensitive     TRIMETH/SULFA <=10 SENSITIVE Sensitive     CLINDAMYCIN <=0.25 SENSITIVE Sensitive     RIFAMPIN <=0.5 SENSITIVE Sensitive     Inducible Clindamycin NEGATIVE Sensitive     * 30,000 COLONIES/mL STAPHYLOCOCCUS EPIDERMIDIS  SARS Coronavirus 2 by RT PCR (hospital order, performed in [place] hospital lab) Nasopharyngeal Nasopharyngeal Swab     Status: None   Collection Time: 11/22/19 12:41 AM   Specimen: Nasopharyngeal Swab  Result Value Ref Range Status   SARS Coronavirus 2 NEGATIVE NEGATIVE Final    Comment: (NOTE) SARS-CoV-2 target nucleic acids are NOT DETECTED.  The SARS-CoV-2 RNA is generally detectable in upper and lower respiratory specimens during the acute phase of infection. The lowest concentration of SARS-CoV-2 viral copies this assay can detect is 250 copies / mL. A negative result does not preclude SARS-CoV-2 infection and should not be used as the sole basis for treatment or other patient management decisions.  A negative result may occur with improper specimen collection / handling, submission of specimen other than nasopharyngeal swab, presence of viral  mutation(s) within the areas targeted by this assay, and inadequate number of viral copies (<250 copies / mL). A negative result must be combined with clinical observations, patient history, and epidemiological information.  Fact Sheet for Patients:   StrictlyIdeas.no  Fact Sheet for Healthcare Providers: BankingDealers.co.za  This test is not yet approved or  cleared by the [place] FDA and has been authorized for detection and/or diagnosis of SARS-CoV-2 by FDA under an Emergency Use Authorization (EUA).  This EUA will remain in effect (meaning this test can be used) for the duration of the COVID-19 declaration under Section 564(b)(1) of the Act, 21 U.S.C. section 360bbb-3(b)(1), unless the authorization is terminated or revoked sooner.  Performed at [facility], [address], [place], [place] [address]     Radiology Reports DG Chest 2 View  Result Date: 11/22/2019 CLINICAL DATA:  Encephalopathy.  Newly discovered brain mass. EXAM: CHEST - 2 VIEW COMPARISON:  None. FINDINGS: The heart size and mediastinal contours are within normal limits. Both lungs are clear. The visualized skeletal structures are unremarkable. IMPRESSION: No active cardiopulmonary disease. Electronically Signed   By: Ulyses Jarred M.D.   On: 11/22/2019 04:03   CT Head Wo Contrast  Result Date: 11/22/2019 CLINICAL DATA:  Altered level of consciousness for 4 days, dizziness EXAM: CT HEAD WITHOUT CONTRAST TECHNIQUE: Contiguous axial images were obtained from the base of the skull through the vertex without intravenous contrast. COMPARISON:  None. FINDINGS: Brain: There is significant vasogenic edema in the left frontal parietal region. 3.2 cm mass is seen within the left parietal white matter. MRI with and without contrast is recommended for further evaluation. Hypodensity extends along the posterior aspect of the corpus callosum, either extension of the mass or  edema. I do not see any definite hemorrhage. There is significant effacement of the left lateral ventricle, with rightward midline shift measuring up to 4 mm at the septum pellucidum. There is diffuse sulcal effacement throughout  the left cerebral hemisphere, greatest along the convexity. No acute extra-axial fluid collections. Vascular: No hyperdense vessel or unexpected calcification. Skull: Normal. Negative for fracture or focal lesion. Sinuses/Orbits: Small metallic densities are seen within the globes bilaterally, likely related to prior glaucoma surgery. Please correlate with surgical history. Otherwise the orbits are unremarkable. Other: None. IMPRESSION: 1. 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 MRI with and without contrast is recommended. 2. Extensive vasogenic edema throughout the left cerebral hemisphere, with significant sulcal effacement and minimal rightward midline shift. Significant effacement of the left lateral ventricle. These results were called by telephone at the time of interpretation on 11/22/2019 at 12:34 am to provider IVA KNAPP , who verbally acknowledged these results. 1. Extensive cerebral edema Electronically Signed   By: Randa Ngo M.D.   On: 11/22/2019 00:34   CT CHEST W CONTRAST  Result Date: 11/23/2019 CLINICAL DATA:  69 y.o. female was brought to the ED due to confusion and gait imbalance. As per patient daughter, patient was having intermittent confusion. Patient denies chest pain, shortness of breath, nausea, vomiting or diarrhea. Mass seen on CT Head and Mri. EXAM: CT CHEST, ABDOMEN, AND PELVIS WITH CONTRAST TECHNIQUE: Multidetector CT imaging of the chest, abdomen and pelvis was performed following the standard protocol during bolus administration of intravenous contrast. CONTRAST:  16mL OMNIPAQUE IOHEXOL 300 MG/ML  SOLN COMPARISON:  Head CT and  brain MRI from 11/22/2019 FINDINGS: CT CHEST FINDINGS Cardiovascular: Heart is normal in size and configuration. No pericardial effusion. No coronary artery calcifications. Great vessels are normal in caliber. No aortic atherosclerosis. Mediastinum/Nodes: No enlarged mediastinal, hilar, or axillary lymph nodes. Thyroid gland, trachea, and esophagus demonstrate no significant findings. Lungs/Pleura: Minor scarring at the base of the left upper lobe lingula. Minimal linear dependent lower lobe opacities consistent with atelectasis. Lungs otherwise clear with no masses or nodules. No pleural effusion or pneumothorax. Musculoskeletal: No fracture or acute finding. No osteoblastic or osteolytic lesions. CT ABDOMEN PELVIS FINDINGS Hepatobiliary: Liver normal in size and overall attenuation. Small low-density lesion between the anterior segment the right lobe and medial segment of the left, consistent with a cyst. Small amount of focal fat adjacent to the falciform ligament. No other abnormalities. Normal gallbladder. No bile duct dilation. Pancreas: Unremarkable. No pancreatic ductal dilatation or surrounding inflammatory changes. Spleen: Normal in size without focal abnormality. Adrenals/Urinary Tract: No adrenal masses. Kidneys normal in size, orientation and position with symmetric enhancement and excretion. Bilateral low-attenuation renal masses consistent with cysts, largest from the lower pole of the left kidney, 4.5 cm. No renal stones. No hydronephrosis. Normal ureters. Normal bladder. Stomach/Bowel: Small hiatal hernia. Stomach is otherwise within normal limits. Appendix appears normal. No evidence of bowel wall thickening, distention, or inflammatory changes. Vascular/Lymphatic: No significant vascular abnormality. No enlarged lymph nodes. Reproductive: Uterus and bilateral adnexa are unremarkable. Other: No abdominal wall hernia or abnormality. No abdominopelvic ascites. Musculoskeletal: No fracture or acute  finding. No osteoblastic or osteolytic lesions. IMPRESSION: 1. No acute findings within the chest, abdomen or pelvis. 2. No evidence of a primary malignancy within the chest, abdomen or pelvis. 3. Small low-density liver lesion consistent with a cyst. Bilateral renal cysts. Electronically Signed   By: Lajean Manes M.D.   On: 11/23/2019 14:38   MR BRAIN W WO CONTRAST  Result Date: 11/23/2019 CLINICAL DATA:  Intracranial mass EXAM: MRI HEAD WITHOUT AND WITH CONTRAST TECHNIQUE: Multiplanar, multiecho  pulse sequences of the brain and surrounding structures were obtained without and with intravenous contrast. CONTRAST:  35mL GADAVIST GADOBUTROL 1 MMOL/ML IV SOLN COMPARISON:  None. FINDINGS: Brain: There is a peripherally and heterogeneously contrast-enhancing mass within the left parietal white matter extending along the corpus callosum splenium. Mass measures 4.8 x 3.3 x 3.0 cm. There is moderate surrounding hyperintense T2-weighted signal a crosses into the right hemisphere. Rightward midline shift measures 3 mm. There is petechial hemorrhage within the mass. Vascular: Normal flow voids. Skull and upper cervical spine: Normal marrow signal. Sinuses/Orbits: Negative. Other: None. IMPRESSION: 1. Heterogeneously contrast-enhancing mass within the left parietal white matter extending along the corpus callosum splenium, most consistent with high-grade glioma. 2. Moderate surrounding hyperintense T2-weighted signal, likely a combination of edema and nonenhancing tumor. 3. 3 mm of rightward midline shift. 4. Intratumoral petechial hemorrhage. Electronically Signed   By: Ulyses Jarred M.D.   On: 11/23/2019 00:09   CT ABDOMEN PELVIS W CONTRAST  Result Date: 11/23/2019 CLINICAL DATA:  69 y.o. female was brought to the ED due to confusion and gait imbalance. As per patient daughter, patient was having intermittent confusion. Patient denies chest pain, shortness of breath, nausea, vomiting or diarrhea. Mass seen on CT Head  and Mri. EXAM: CT CHEST, ABDOMEN, AND PELVIS WITH CONTRAST TECHNIQUE: Multidetector CT imaging of the chest, abdomen and pelvis was performed following the standard protocol during bolus administration of intravenous contrast. CONTRAST:  121mL OMNIPAQUE IOHEXOL 300 MG/ML  SOLN COMPARISON:  Head CT and brain MRI from 11/22/2019 FINDINGS: CT CHEST FINDINGS Cardiovascular: Heart is normal in size and configuration. No pericardial effusion. No coronary artery calcifications. Great vessels are normal in caliber. No aortic atherosclerosis. Mediastinum/Nodes: No enlarged mediastinal, hilar, or axillary lymph nodes. Thyroid gland, trachea, and esophagus demonstrate no significant findings. Lungs/Pleura: Minor scarring at the base of the left upper lobe lingula. Minimal linear dependent lower lobe opacities consistent with atelectasis. Lungs otherwise clear with no masses or nodules. No pleural effusion or pneumothorax. Musculoskeletal: No fracture or acute finding. No osteoblastic or osteolytic lesions. CT ABDOMEN PELVIS FINDINGS Hepatobiliary: Liver normal in size and overall attenuation. Small low-density lesion between the anterior segment the right lobe and medial segment of the left, consistent with a cyst. Small amount of focal fat adjacent to the falciform ligament. No other abnormalities. Normal gallbladder. No bile duct dilation. Pancreas: Unremarkable. No pancreatic ductal dilatation or surrounding inflammatory changes. Spleen: Normal in size without focal abnormality. Adrenals/Urinary Tract: No adrenal masses. Kidneys normal in size, orientation and position with symmetric enhancement and excretion. Bilateral low-attenuation renal masses consistent with cysts, largest from the lower pole of the left kidney, 4.5 cm. No renal stones. No hydronephrosis. Normal ureters. Normal bladder. Stomach/Bowel: Small hiatal hernia. Stomach is otherwise within normal limits. Appendix appears normal. No evidence of bowel wall  thickening, distention, or inflammatory changes. Vascular/Lymphatic: No significant vascular abnormality. No enlarged lymph nodes. Reproductive: Uterus and bilateral adnexa are unremarkable. Other: No abdominal wall hernia or abnormality. No abdominopelvic ascites. Musculoskeletal: No fracture or acute finding. No osteoblastic or osteolytic lesions. IMPRESSION: 1. No acute findings within the chest, abdomen or pelvis. 2. No evidence of a primary malignancy within the chest, abdomen or pelvis. 3. Small low-density liver lesion consistent with a cyst. Bilateral renal cysts. Electronically Signed   By: Lajean Manes M.D.   On: 11/23/2019 14:38    Lab Data:  CBC: Recent Labs  Lab 11/21/19 2137 11/22/19 0537 11/23/19 0837 11/24/19 0306  WBC 7.1  6.3 15.6* 13.9*  NEUTROABS 5.1  --  14.1* 12.2*  HGB 14.8 14.8 15.2* 13.6  HCT 43.5 43.8 44.3 40.2  MCV 84.8 84.6 84.1 84.3  PLT 356 377 454* 897   Basic Metabolic Panel: Recent Labs  Lab 11/21/19 2137 11/22/19 0537 11/23/19 0837 11/24/19 0306  NA 136 140 140 140  K 2.8* 4.0 3.9 3.7  CL 99 104 103 106  CO2 27 25 24 24   GLUCOSE 155* 138* 123* 126*  BUN 12 10 14 16   CREATININE 0.83 0.65 0.79 0.83  CALCIUM 8.8* 9.0 8.7* 8.2*  MG  --  1.9 2.1  --    GFR: Estimated Creatinine Clearance: 62.7 mL/min (by C-G formula based on SCr of 0.83 mg/dL). Liver Function Tests: Recent Labs  Lab 11/21/19 2137 11/22/19 0537 11/23/19 0837  AST 37 32 24  ALT 35 34 29  ALKPHOS 52 53 53  BILITOT 0.4 0.7 0.4  PROT 7.2 7.4 7.1  ALBUMIN 4.3 4.4 3.8   No results for input(s): LIPASE, AMYLASE in the last 168 hours. No results for input(s): AMMONIA in the last 168 hours. Coagulation Profile: No results for input(s): INR, PROTIME in the last 168 hours. Cardiac Enzymes: No results for input(s): CKTOTAL, CKMB, CKMBINDEX, TROPONINI in the last 168 hours. BNP (last 3 results) No results for input(s): PROBNP in the last 8760 hours. HbA1C: No results for  input(s): HGBA1C in the last 72 hours. CBG: No results for input(s): GLUCAP in the last 168 hours. Lipid Profile: No results for input(s): CHOL, HDL, LDLCALC, TRIG, CHOLHDL, LDLDIRECT in the last 72 hours. Thyroid Function Tests: Recent Labs    11/23/19 0837  TSH 0.412   Anemia Panel: No results for input(s): VITAMINB12, FOLATE, FERRITIN, TIBC, IRON, RETICCTPCT in the last 72 hours. Urine analysis:    Component Value Date/Time   COLORURINE YELLOW 11/21/2019 2248   APPEARANCEUR HAZY (A) 11/21/2019 2248   LABSPEC 1.016 11/21/2019 2248   PHURINE 5.0 11/21/2019 2248   GLUCOSEU NEGATIVE 11/21/2019 2248   HGBUR MODERATE (A) 11/21/2019 2248   BILIRUBINUR NEGATIVE 11/21/2019 2248   KETONESUR NEGATIVE 11/21/2019 2248   PROTEINUR NEGATIVE 11/21/2019 2248   NITRITE NEGATIVE 11/21/2019 2248   LEUKOCYTESUR LARGE (A) 11/21/2019 2248     Veralyn Lopp M.D. [facility] 11/24/2019, 10:37 AM   Call night coverage person covering after 7pm

## 2019-11-25 ENCOUNTER — Other Ambulatory Visit: Payer: Self-pay | Admitting: Family Medicine

## 2019-11-25 ENCOUNTER — Other Ambulatory Visit: Payer: Self-pay | Admitting: Neurosurgery

## 2019-11-25 LAB — BASIC METABOLIC PANEL
Anion gap: 8 (ref 5–15)
BUN: 14 mg/dL (ref 8–23)
CO2: 25 mmol/L (ref 22–32)
Calcium: 8.6 mg/dL — ABNORMAL LOW (ref 8.9–10.3)
Chloride: 106 mmol/L (ref 98–111)
Creatinine, Ser: 0.7 mg/dL (ref 0.44–1.00)
GFR calc Af Amer: >60 mL/min (ref >60)
GFR calc non Af Amer: >60 mL/min (ref >60)
Glucose, Bld: 127 mg/dL — ABNORMAL HIGH (ref 70–99)
Potassium: 4.1 mmol/L (ref 3.5–5.1)
Sodium: 139 mmol/L (ref 135–145)

## 2019-11-25 LAB — TYPE AND SCREEN
ABO/RH(D): O POS
Antibody Screen: NEGATIVE

## 2019-11-25 LAB — ABO/RH: ABO/RH(D): O POS

## 2019-11-25 LAB — PROTIME-INR
INR: 1.1 (ref 0.8–1.2)
Prothrombin Time: 13.8 seconds (ref 11.4–15.2)

## 2019-11-25 LAB — MAGNESIUM: Magnesium: 2.1 mg/dL (ref 1.7–2.4)

## 2019-11-25 MED ORDER — CEFAZOLIN SODIUM-DEXTROSE 2-4 GM/100ML-% IV SOLN
2.0000 g | INTRAVENOUS | Status: AC
  Administered 2019-11-26: 2 g via INTRAVENOUS
  Filled 2019-11-25: qty 100

## 2019-11-25 NOTE — Plan of Care (Signed)
  Problem: Clinical Measurements: Goal: Ability to maintain clinical measurements within normal limits will improve Outcome: Progressing   

## 2019-11-25 NOTE — Progress Notes (Addendum)
[facility]                                                                              Patient Demographics  Wanda Ross, is a 69 y.o. female, DOB - 04/01/1951, WLN:[id number]  Admit date - 11/21/2019   Admitting Physician Oswald Hillock, MD  Outpatient Primary MD for the patient is Fayrene Helper, MD  Outpatient specialists:   LOS - 3  days   Medical records reviewed and are as summarized below:    Chief Complaint  Patient presents with  . Altered Mental Status       Brief summary   Per HPI: [name] a69 y.o.female,with history of glaucoma, hyperlipidemia, hypertension was brought to the ED due to confusion and gait imbalance. As per patient daughter, patient was having intermittent confusion.Patient denies chest pain, shortness of breath, nausea, vomiting or diarrhea. Denies blurred vision. No speech disturbance. Patient complained of headache, poor appetite for past 2 weeks. 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 Denies abdominal pain or dysuria, Denies weight loss. No previous history of malignancy In the ED CT of the head was obtained which showed ill-defined 3.2 cm mass within the left parietal white matter, compatible malignancy. 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 Extensive vasogenic edema noted throughout the left cerebral hemisphere with significant sulcal effacement and minimal rightward midline shift. Neurosurgeon Dr. Marcello Moores was consulted by ED physician who recommended Decadron, MRI brain with and without contrast and transfer to [facility]    Brain mass/glioma-new diagnosis -CT head showed brain mass with vasogenic edema throughout the left cerebral hemisphere with minimal rightward midline shift -Currently no acute neurological deficits, visual field cut, neurosurgery following -Continue Decadron -Plan for surgery on Wednesday, 11/26/2019, will get baseline EKG -CT chest abdomen and pelvis  showed no acute findings within the chest abdomen and pelvis, no evidence of primary malignancy  Abnormal UA -Urine culture showed 60,000 colonies staph epidermidis, likely poor sample, no dysuria no fever.  Monitor.  Leukocytosis is due to steroids.  Hold antibiotics.   Essential hypertension Continue amlodipine, Catapres  Hyperlipidemia Continue pravastatin   Code Status: Full DVT Prophylaxis:  SCD's Family Communication: Discussed all imaging results, lab results, explained to the patient.  Called patient's sister, previous MD left detailed message on the phone.   Disposition Plan:     Status is: Inpatient  Remains inpatient appropriate because:Inpatient level of care appropriate due to severity of illness   Dispo: The patient is from: Home              Anticipated d/c is to: TBD              Anticipated d/c date is: > 3 days              Patient currently is not medically stable to d/c.   Time Spent in minutes 25 minutes  Procedures:  None  Consultants:   Neurosurgery  Antimicrobials:   Anti-infectives (From admission, onward)   Start     Dose/Rate Route Frequency Ordered Stop   11/24/19 1530  clindamycin (CLEOCIN) capsule 300 mg  Status:  Discontinued        300 mg Oral 2 times daily 11/24/19 1518 11/25/19 0929   11/23/19 1400  ampicillin-sulbactam (UNASYN) 1.5 g in sodium chloride 0.9 % 100 mL IVPB  Status:  Discontinued        1.5 g 200 mL/hr over 30 Minutes Intravenous Every 6 hours 11/23/19 1325 11/24/19 1518   11/22/19 0800  cefTRIAXone (ROCEPHIN) 1 g in sodium chloride 0.9 % 100 mL IVPB  Status:  Discontinued        1 g 200 mL/hr over 30 Minutes Intravenous Every 24 hours 11/22/19 0320 11/22/19 0444   11/22/19 0445  cefTRIAXone (ROCEPHIN) 1 g in sodium chloride 0.9 % 100 mL IVPB  Status:  Discontinued        1 g 200 mL/hr over 30 Minutes Intravenous Every 24 hours 11/22/19 0444 11/23/19 1325   11/21/19 2345  cephALEXin (KEFLEX) capsule 500 mg        500  mg Oral  Once 11/21/19 2330 11/22/19 0034       Medications  Scheduled Meds: . amLODipine  5 mg Oral Daily  . cloNIDine  0.3 mg Oral QHS  . dexamethasone  4 mg Oral BID  . dorzolamide-timolol  1 drop Right Eye BID  . pravastatin  10 mg Oral q1800   Continuous Infusions:  PRN Meds:.acetaminophen **OR** [DISCONTINUED] acetaminophen, [DISCONTINUED] ondansetron **OR** ondansetron (ZOFRAN) IV   Subjective:   Patient in bed, appears comfortable, denies any headache, no fever, no chest pain or pressure, no shortness of breath , no abdominal pain. No focal weakness.   Objective:   Vitals:   11/24/19 1938 11/24/19 2332 11/25/19 0340 11/25/19 0745  BP: 136/79 132/74 130/67 134/74  Pulse: 78 62 (!) 59 69  Resp: 16 16 16 17   Temp: 97.9 F (36.6 C) 98.5 F (36.9 C) 98 F (36.7 C) 98.2 F (36.8 C)  TempSrc: Oral Oral Oral Oral  SpO2: 98% 98% 97% 99%  Weight:      Height:        Intake/Output Summary (Last 24 hours) at 11/25/2019 0929 Last data filed at 11/24/2019 1545 Gross per 24 hour  Intake 200 ml  Output --  Net 200 ml     Wt Readings from Last 3 Encounters:  11/21/19 68.9 kg  10/22/19 68.9 kg  06/04/19 68.9 kg     Exam  Awake Alert, No new F.N deficits, Normal affect [place].AT,PERRAL Supple Neck,No JVD, No cervical lymphadenopathy appriciated.  Symmetrical Chest wall movement, Good air movement bilaterally, CTAB RRR,No Gallops, Rubs or new Murmurs, No Parasternal Heave +ve B.Sounds, Abd Soft, No tenderness, No organomegaly appriciated, No rebound - guarding or rigidity. No Cyanosis, Clubbing or edema, No new Rash or bruise    Data Reviewed:  I have personally reviewed following labs and imaging studies  Micro Results Recent Results (from the past 240 hour(s))  Urine culture     Status: Abnormal   Collection Time: 11/21/19 10:48 PM   Specimen: Urine, Clean Catch  Result Value Ref Range Status   Specimen Description   Final    URINE, CLEAN  CATCH Performed at [facility], [address], [place], [place] [address]    Special Requests   Final    NONE Performed at [facility], [address] [place], [place] [address]    Culture (A)  Final    60,000 COLONIES/mL GROUP B STREP(S.AGALACTIAE)ISOLATED TESTING AGAINST S. AGALACTIAE NOT ROUTINELY PERFORMED DUE TO PREDICTABILITY OF AMP/PEN/VAN SUSCEPTIBILITY. 30,000 COLONIES/mL STAPHYLOCOCCUS  EPIDERMIDIS    Report Status 11/24/2019 FINAL  Final   Organism ID, Bacteria STAPHYLOCOCCUS EPIDERMIDIS (A)  Final      Susceptibility   Staphylococcus epidermidis - MIC*    CIPROFLOXACIN <=0.5 SENSITIVE Sensitive     GENTAMICIN <=0.5 SENSITIVE Sensitive     NITROFURANTOIN <=16 SENSITIVE Sensitive     OXACILLIN >=4 RESISTANT Resistant     TETRACYCLINE <=1 SENSITIVE Sensitive     VANCOMYCIN 2 SENSITIVE Sensitive     TRIMETH/SULFA <=10 SENSITIVE Sensitive     CLINDAMYCIN <=0.25 SENSITIVE Sensitive     RIFAMPIN <=0.5 SENSITIVE Sensitive     Inducible Clindamycin NEGATIVE Sensitive     * 30,000 COLONIES/mL STAPHYLOCOCCUS EPIDERMIDIS  SARS Coronavirus 2 by RT PCR (hospital order, performed in [place] hospital lab) Nasopharyngeal Nasopharyngeal Swab     Status: None   Collection Time: 11/22/19 12:41 AM   Specimen: Nasopharyngeal Swab  Result Value Ref Range Status   SARS Coronavirus 2 NEGATIVE NEGATIVE Final    Comment: (NOTE) SARS-CoV-2 target nucleic acids are NOT DETECTED.  The SARS-CoV-2 RNA is generally detectable in upper and lower respiratory specimens during the acute phase of infection. The lowest concentration of SARS-CoV-2 viral copies this assay can detect is 250 copies / mL. A negative result does not preclude SARS-CoV-2 infection and should not be used as the sole basis for treatment or other patient management decisions.  A negative result may occur with improper specimen collection / handling, submission of specimen other than nasopharyngeal swab, presence of viral  mutation(s) within the areas targeted by this assay, and inadequate number of viral copies (<250 copies / mL). A negative result must be combined with clinical observations, patient history, and epidemiological information.  Fact Sheet for Patients:   StrictlyIdeas.no  Fact Sheet for Healthcare Providers: BankingDealers.co.za  This test is not yet approved or  cleared by the [place] FDA and has been authorized for detection and/or diagnosis of SARS-CoV-2 by FDA under an Emergency Use Authorization (EUA).  This EUA will remain in effect (meaning this test can be used) for the duration of the COVID-19 declaration under Section 564(b)(1) of the Act, 21 U.S.C. section 360bbb-3(b)(1), unless the authorization is terminated or revoked sooner.  Performed at [facility], [address] [place], [place] [address]     Radiology Reports DG Chest 2 View  Result Date: 11/22/2019 CLINICAL DATA:  Encephalopathy.  Newly discovered brain mass. EXAM: CHEST - 2 VIEW COMPARISON:  None. FINDINGS: The heart size and mediastinal contours are within normal limits. Both lungs are clear. The visualized skeletal structures are unremarkable. IMPRESSION: No active cardiopulmonary disease. Electronically Signed   By: Ulyses Jarred M.D.   On: 11/22/2019 04:03   CT Head Wo Contrast  Result Date: 11/22/2019 CLINICAL DATA:  Altered level of consciousness for 4 days, dizziness EXAM: CT HEAD WITHOUT CONTRAST TECHNIQUE: Contiguous axial images were obtained from the base of the skull through the vertex without intravenous contrast. COMPARISON:  None. FINDINGS: Brain: There is significant vasogenic edema in the left frontal parietal region. 3.2 cm mass is seen within the left parietal white matter. MRI with and without contrast is recommended for further evaluation. Hypodensity extends along the posterior aspect of the corpus callosum, either extension of the mass or  edema. I do not see any definite hemorrhage. There is significant effacement of the left lateral ventricle, with rightward midline shift measuring up to 4 mm at the septum pellucidum. There is diffuse sulcal effacement throughout the  left cerebral hemisphere, greatest along the convexity. No acute extra-axial fluid collections. Vascular: No hyperdense vessel or unexpected calcification. Skull: Normal. Negative for fracture or focal lesion. Sinuses/Orbits: Small metallic densities are seen within the globes bilaterally, likely related to prior glaucoma surgery. Please correlate with surgical history. Otherwise the orbits are unremarkable. Other: None. IMPRESSION: 1. 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 MRI with and without contrast is recommended. 2. Extensive vasogenic edema throughout the left cerebral hemisphere, with significant sulcal effacement and minimal rightward midline shift. Significant effacement of the left lateral ventricle. These results were called by telephone at the time of interpretation on 11/22/2019 at 12:34 am to provider IVA KNAPP , who verbally acknowledged these results. 1. Extensive cerebral edema Electronically Signed   By: Randa Ngo M.D.   On: 11/22/2019 00:34   CT CHEST W CONTRAST  Result Date: 11/23/2019 CLINICAL DATA:  69 y.o. female was brought to the ED due to confusion and gait imbalance. As per patient daughter, patient was having intermittent confusion. Patient denies chest pain, shortness of breath, nausea, vomiting or diarrhea. Mass seen on CT Head and Mri. EXAM: CT CHEST, ABDOMEN, AND PELVIS WITH CONTRAST TECHNIQUE: Multidetector CT imaging of the chest, abdomen and pelvis was performed following the standard protocol during bolus administration of intravenous contrast. CONTRAST:  164mL OMNIPAQUE IOHEXOL 300 MG/ML  SOLN COMPARISON:  Head CT and  brain MRI from 11/22/2019 FINDINGS: CT CHEST FINDINGS Cardiovascular: Heart is normal in size and configuration. No pericardial effusion. No coronary artery calcifications. Great vessels are normal in caliber. No aortic atherosclerosis. Mediastinum/Nodes: No enlarged mediastinal, hilar, or axillary lymph nodes. Thyroid gland, trachea, and esophagus demonstrate no significant findings. Lungs/Pleura: Minor scarring at the base of the left upper lobe lingula. Minimal linear dependent lower lobe opacities consistent with atelectasis. Lungs otherwise clear with no masses or nodules. No pleural effusion or pneumothorax. Musculoskeletal: No fracture or acute finding. No osteoblastic or osteolytic lesions. CT ABDOMEN PELVIS FINDINGS Hepatobiliary: Liver normal in size and overall attenuation. Small low-density lesion between the anterior segment the right lobe and medial segment of the left, consistent with a cyst. Small amount of focal fat adjacent to the falciform ligament. No other abnormalities. Normal gallbladder. No bile duct dilation. Pancreas: Unremarkable. No pancreatic ductal dilatation or surrounding inflammatory changes. Spleen: Normal in size without focal abnormality. Adrenals/Urinary Tract: No adrenal masses. Kidneys normal in size, orientation and position with symmetric enhancement and excretion. Bilateral low-attenuation renal masses consistent with cysts, largest from the lower pole of the left kidney, 4.5 cm. No renal stones. No hydronephrosis. Normal ureters. Normal bladder. Stomach/Bowel: Small hiatal hernia. Stomach is otherwise within normal limits. Appendix appears normal. No evidence of bowel wall thickening, distention, or inflammatory changes. Vascular/Lymphatic: No significant vascular abnormality. No enlarged lymph nodes. Reproductive: Uterus and bilateral adnexa are unremarkable. Other: No abdominal wall hernia or abnormality. No abdominopelvic ascites. Musculoskeletal: No fracture or acute  finding. No osteoblastic or osteolytic lesions. IMPRESSION: 1. No acute findings within the chest, abdomen or pelvis. 2. No evidence of a primary malignancy within the chest, abdomen or pelvis. 3. Small low-density liver lesion consistent with a cyst. Bilateral renal cysts. Electronically Signed   By: Lajean Manes M.D.   On: 11/23/2019 14:38   MR BRAIN W WO CONTRAST  Result Date: 11/23/2019 CLINICAL DATA:  Intracranial mass EXAM: MRI HEAD WITHOUT AND WITH CONTRAST TECHNIQUE: Multiplanar, multiecho pulse  sequences of the brain and surrounding structures were obtained without and with intravenous contrast. CONTRAST:  25mL GADAVIST GADOBUTROL 1 MMOL/ML IV SOLN COMPARISON:  None. FINDINGS: Brain: There is a peripherally and heterogeneously contrast-enhancing mass within the left parietal white matter extending along the corpus callosum splenium. Mass measures 4.8 x 3.3 x 3.0 cm. There is moderate surrounding hyperintense T2-weighted signal a crosses into the right hemisphere. Rightward midline shift measures 3 mm. There is petechial hemorrhage within the mass. Vascular: Normal flow voids. Skull and upper cervical spine: Normal marrow signal. Sinuses/Orbits: Negative. Other: None. IMPRESSION: 1. Heterogeneously contrast-enhancing mass within the left parietal white matter extending along the corpus callosum splenium, most consistent with high-grade glioma. 2. Moderate surrounding hyperintense T2-weighted signal, likely a combination of edema and nonenhancing tumor. 3. 3 mm of rightward midline shift. 4. Intratumoral petechial hemorrhage. Electronically Signed   By: Ulyses Jarred M.D.   On: 11/23/2019 00:09   CT ABDOMEN PELVIS W CONTRAST  Result Date: 11/23/2019 CLINICAL DATA:  69 y.o. female was brought to the ED due to confusion and gait imbalance. As per patient daughter, patient was having intermittent confusion. Patient denies chest pain, shortness of breath, nausea, vomiting or diarrhea. Mass seen on CT Head  and Mri. EXAM: CT CHEST, ABDOMEN, AND PELVIS WITH CONTRAST TECHNIQUE: Multidetector CT imaging of the chest, abdomen and pelvis was performed following the standard protocol during bolus administration of intravenous contrast. CONTRAST:  128mL OMNIPAQUE IOHEXOL 300 MG/ML  SOLN COMPARISON:  Head CT and brain MRI from 11/22/2019 FINDINGS: CT CHEST FINDINGS Cardiovascular: Heart is normal in size and configuration. No pericardial effusion. No coronary artery calcifications. Great vessels are normal in caliber. No aortic atherosclerosis. Mediastinum/Nodes: No enlarged mediastinal, hilar, or axillary lymph nodes. Thyroid gland, trachea, and esophagus demonstrate no significant findings. Lungs/Pleura: Minor scarring at the base of the left upper lobe lingula. Minimal linear dependent lower lobe opacities consistent with atelectasis. Lungs otherwise clear with no masses or nodules. No pleural effusion or pneumothorax. Musculoskeletal: No fracture or acute finding. No osteoblastic or osteolytic lesions. CT ABDOMEN PELVIS FINDINGS Hepatobiliary: Liver normal in size and overall attenuation. Small low-density lesion between the anterior segment the right lobe and medial segment of the left, consistent with a cyst. Small amount of focal fat adjacent to the falciform ligament. No other abnormalities. Normal gallbladder. No bile duct dilation. Pancreas: Unremarkable. No pancreatic ductal dilatation or surrounding inflammatory changes. Spleen: Normal in size without focal abnormality. Adrenals/Urinary Tract: No adrenal masses. Kidneys normal in size, orientation and position with symmetric enhancement and excretion. Bilateral low-attenuation renal masses consistent with cysts, largest from the lower pole of the left kidney, 4.5 cm. No renal stones. No hydronephrosis. Normal ureters. Normal bladder. Stomach/Bowel: Small hiatal hernia. Stomach is otherwise within normal limits. Appendix appears normal. No evidence of bowel wall  thickening, distention, or inflammatory changes. Vascular/Lymphatic: No significant vascular abnormality. No enlarged lymph nodes. Reproductive: Uterus and bilateral adnexa are unremarkable. Other: No abdominal wall hernia or abnormality. No abdominopelvic ascites. Musculoskeletal: No fracture or acute finding. No osteoblastic or osteolytic lesions. IMPRESSION: 1. No acute findings within the chest, abdomen or pelvis. 2. No evidence of a primary malignancy within the chest, abdomen or pelvis. 3. Small low-density liver lesion consistent with a cyst. Bilateral renal cysts. Electronically Signed   By: Lajean Manes M.D.   On: 11/23/2019 14:38    Lab Data:  CBC: Recent Labs  Lab 11/21/19 2137 11/22/19 0537 11/23/19 0837 11/24/19 0306  WBC 7.1 6.3  15.6* 13.9*  NEUTROABS 5.1  --  14.1* 12.2*  HGB 14.8 14.8 15.2* 13.6  HCT 43.5 43.8 44.3 40.2  MCV 84.8 84.6 84.1 84.3  PLT 356 377 454* 327   Basic Metabolic Panel: Recent Labs  Lab 11/21/19 2137 11/22/19 0537 11/23/19 0837 11/24/19 0306 11/25/19 0312 11/25/19 0719  NA 136 140 140 140 139  --   K 2.8* 4.0 3.9 3.7 4.1  --   CL 99 104 103 106 106  --   CO2 27 25 24 24 25   --   GLUCOSE 155* 138* 123* 126* 127*  --   BUN 12 10 14 16 14   --   CREATININE 0.83 0.65 0.79 0.83 0.70  --   CALCIUM 8.8* 9.0 8.7* 8.2* 8.6*  --   MG  --  1.9 2.1  --   --  2.1   GFR: Estimated Creatinine Clearance: 65.1 mL/min (by C-G formula based on SCr of 0.7 mg/dL). Liver Function Tests: Recent Labs  Lab 11/21/19 2137 11/22/19 0537 11/23/19 0837  AST 37 32 24  ALT 35 34 29  ALKPHOS 52 53 53  BILITOT 0.4 0.7 0.4  PROT 7.2 7.4 7.1  ALBUMIN 4.3 4.4 3.8   No results for input(s): LIPASE, AMYLASE in the last 168 hours. No results for input(s): AMMONIA in the last 168 hours. Coagulation Profile: Recent Labs  Lab 11/25/19 0719  INR 1.1   Cardiac Enzymes: No results for input(s): CKTOTAL, CKMB, CKMBINDEX, TROPONINI in the last 168 hours. BNP (last  3 results) No results for input(s): PROBNP in the last 8760 hours. HbA1C: No results for input(s): HGBA1C in the last 72 hours. CBG: No results for input(s): GLUCAP in the last 168 hours. Lipid Profile: No results for input(s): CHOL, HDL, LDLCALC, TRIG, CHOLHDL, LDLDIRECT in the last 72 hours. Thyroid Function Tests: Recent Labs    11/23/19 0837  TSH 0.412   Anemia Panel: No results for input(s): VITAMINB12, FOLATE, FERRITIN, TIBC, IRON, RETICCTPCT in the last 72 hours. Urine analysis:    Component Value Date/Time   COLORURINE YELLOW 11/21/2019 2248   APPEARANCEUR HAZY (A) 11/21/2019 2248   LABSPEC 1.016 11/21/2019 2248   PHURINE 5.0 11/21/2019 2248   GLUCOSEU NEGATIVE 11/21/2019 2248   HGBUR MODERATE (A) 11/21/2019 2248   BILIRUBINUR NEGATIVE 11/21/2019 2248   KETONESUR NEGATIVE 11/21/2019 2248   PROTEINUR NEGATIVE 11/21/2019 2248   NITRITE NEGATIVE 11/21/2019 2248   LEUKOCYTESUR LARGE (A) 11/21/2019 2248     Lala Lund M.D. [facility] 11/25/2019, 9:29 AM   Call night coverage person covering after 7pm

## 2019-11-25 NOTE — Progress Notes (Signed)
Neurosurgery  Discussed the procedure in detail with the patient and the daughter at the bedside. Risk and benefits,Alternatives, and expected convalescence were discussed. Risks discussed included, but were not limited to bleeding, pain, infection, seizure, Scar, stroke, recurrence, residual, neurologic deficit, coma, and death.  Informed consent was obtained.

## 2019-11-25 NOTE — Evaluation (Signed)
Physical Therapy Evaluation Patient Details Name: Wanda Ross MRN: [id number] DOB: Jan 18, 1951 Today's Date: 11/25/2019   History of Present Illness  69 yo female with onset of confusion and changes in balance was admitted, now dx with L parietal glioma, L cerebral hemisphere edema, procedure on 6/30 for neurosurgical resection.  PMHx:  glaucoma, HTN, HLD, Impaired glucose tolerance, multinodal goiter  Clinical Impression  Pt was seen for initial mobility evaluation and noted her inattention to R side, struggle with use of RLE in mm testing and drifting toward R side during obstacle clearance tasks.   Pt is motivated to get home and continue to live independently, which is currently an unsafe choice.  Will recommend reassessing mobility post procedure, to get an updated picture of safety with mobility.  Follow acutely for these needs.    Follow Up Recommendations CIR    Equipment Recommendations  None recommended by PT    Recommendations for Other Services Rehab consult     Precautions / Restrictions Precautions Precautions: Fall Precaution Comments: monitor vitals as needed Restrictions Weight Bearing Restrictions: No      Mobility  Bed Mobility Overal bed mobility: Needs Assistance             General bed mobility comments: up in chair when PT arrived  Transfers Overall transfer level: Needs assistance Equipment used: 1 person hand held assist Transfers: Sit to/from Stand Sit to Stand: Min guard         General transfer comment: min guard for safety  Ambulation/Gait Ambulation/Gait assistance: Min guard Gait Distance (Feet): 175 Feet Assistive device: 1 person hand held assist Gait Pattern/deviations: Step-to pattern;Step-through pattern;Decreased stance time - right;Drifts right/left Gait velocity: reduced, variable Gait velocity interpretation: <1.31 ft/sec, indicative of household ambulator General Gait Details: pt is inattentive to R side, barely  clearing obstacles on R   Stairs            Wheelchair Mobility    Modified Rankin (Stroke Patients Only)       Balance Overall balance assessment: Needs assistance Sitting-balance support: Feet supported Sitting balance-Leahy Scale: Good     Standing balance support: Single extremity supported;During functional activity Standing balance-Leahy Scale: Fair                               Pertinent Vitals/Pain Pain Assessment: No/denies pain    Home Living Family/patient expects to be discharged to:: Inpatient rehab Living Arrangements: Alone Available Help at Discharge: Family;Friend(s);Available PRN/intermittently Type of Home: House Home Access: Stairs to enter Entrance Stairs-Rails: None Entrance Stairs-Number of Steps: 2 Home Layout: One level Home Equipment: Walker - 2 wheels Additional Comments: Pt is not using an AD at home currently    Prior Function Level of Independence: Independent         Comments: pt does not report falls but gait has changed     Hand Dominance   Dominant Hand: Right    Extremity/Trunk Assessment   Upper Extremity Assessment Upper Extremity Assessment: Overall WFL for tasks assessed    Lower Extremity Assessment Lower Extremity Assessment: Overall WFL for tasks assessed (for strength)    Cervical / Trunk Assessment Cervical / Trunk Assessment: Normal  Communication   Communication: No difficulties  Cognition Arousal/Alertness: Awake/alert Behavior During Therapy: Impulsive Overall Cognitive Status: Impaired/Different from baseline Area of Impairment: Problem solving;Awareness;Safety/judgement;Attention  Current Attention Level: Selective     Safety/Judgement: Decreased awareness of deficits;Decreased awareness of safety Awareness: Intellectual Problem Solving: Slow processing;Requires verbal cues General Comments: R side inattention and LLE having greater stance times       General Comments General comments (skin integrity, edema, etc.): Pt is having both inattention to RLE including with mm testing, and lack of gait control on RLE, increased LLE WB, more even distribution with RUE support    Exercises     Assessment/Plan    PT Assessment Patient needs continued PT services  PT Problem List Decreased activity tolerance;Decreased balance;Decreased mobility;Decreased coordination;Decreased cognition;Decreased knowledge of use of DME;Decreased safety awareness;Cardiopulmonary status limiting activity       PT Treatment Interventions DME instruction;Stair training;Gait training;Functional mobility training;Therapeutic activities;Therapeutic exercise;Balance training;Neuromuscular re-education;Patient/family education    PT Goals (Current goals can be found in the Care Plan section)  Acute Rehab PT Goals Patient Stated Goal: to get home PT Goal Formulation: With patient/family Time For Goal Achievement: 12/09/19 Potential to Achieve Goals: Good    Frequency Min 3X/week   Barriers to discharge Decreased caregiver support;Inaccessible home environment stairs to enter house and home alone    Co-evaluation               AM-PAC PT "6 Clicks" Mobility  Outcome Measure Help needed turning from your back to your side while in a flat bed without using bedrails?: A Little Help needed moving from lying on your back to sitting on the side of a flat bed without using bedrails?: A Little Help needed moving to and from a bed to a chair (including a wheelchair)?: A Little Help needed standing up from a chair using your arms (e.g., wheelchair or bedside chair)?: A Little Help needed to walk in hospital room?: A Little Help needed climbing 3-5 steps with a railing? : A Lot 6 Click Score: 17    End of Session Equipment Utilized During Treatment: Gait belt Activity Tolerance: Treatment limited secondary to medical complications (Comment) Patient left: in  chair;with call bell/phone within reach;with family/visitor present Nurse Communication: Mobility status PT Visit Diagnosis: Unsteadiness on feet (R26.81);Ataxic gait (R26.0)    Time: 1030-1048 PT Time Calculation (min) (ACUTE ONLY): 18 min   Charges:   PT Evaluation $PT Eval Moderate Complexity: 1 Mod         Ramond Dial 11/25/2019, 8:32 PM  Mee Hives, PT MS Acute Rehab Dept. Number: [place] [facility] [place]

## 2019-11-25 NOTE — Anesthesia Preprocedure Evaluation (Addendum)
Anesthesia Evaluation  Patient identified by MRN, date of birth, ID band Patient awake    Reviewed: Allergy & Precautions, NPO status , Patient's Chart, lab work & pertinent test results  History of Anesthesia Complications Negative for: history of anesthetic complications  Airway Mallampati: I  TM Distance: >3 FB Neck ROM: Full    Dental  (+) Dental Advisory Given, Poor Dentition, Caps, Missing   Pulmonary neg pulmonary ROS,  11/22/2019 SARS coronavirus NEG   breath sounds clear to auscultation       Cardiovascular hypertension, Pt. on medications (-) angina Rhythm:Regular Rate:Normal     Neuro/Psych Speech issues: L thalamic tumor (glioma) glaucoma    GI/Hepatic negative GI ROS, Neg liver ROS,   Endo/Other  goiter  Renal/GU negative Renal ROS     Musculoskeletal   Abdominal   Peds  Hematology negative hematology ROS (+)   Anesthesia Other Findings   Reproductive/Obstetrics                          Anesthesia Physical Anesthesia Plan  ASA: III  Anesthesia Plan: General   Post-op Pain Management:    Induction: Intravenous  PONV Risk Score and Plan: 3 and Ondansetron, Dexamethasone and Treatment may vary due to age or medical condition  Airway Management Planned: Oral ETT  Additional Equipment: Arterial line  Intra-op Plan:   Post-operative Plan: Extubation in OR  Informed Consent: I have reviewed the patients History and Physical, chart, labs and discussed the procedure including the risks, benefits and alternatives for the proposed anesthesia with the patient or authorized representative who has indicated his/her understanding and acceptance.     Dental advisory given  Plan Discussed with: CRNA and Surgeon  Anesthesia Plan Comments:        Anesthesia Quick Evaluation

## 2019-11-26 ENCOUNTER — Encounter (HOSPITAL_COMMUNITY)
Disposition: A | Discharge status: Rehabilitation Facility | Payer: Self-pay | Source: Home / Self Care | Attending: Internal Medicine

## 2019-11-26 ENCOUNTER — Inpatient Hospital Stay (HOSPITAL_COMMUNITY)
Admit: 2019-11-26 | Discharge: 2019-11-26 | Disposition: A | Discharge status: Home / Self Care | Payer: Medicare PPO | Attending: Neurosurgery | Admitting: Neurosurgery

## 2019-11-26 ENCOUNTER — Inpatient Hospital Stay (HOSPITAL_COMMUNITY): Payer: Medicare PPO | Admitting: Anesthesiology

## 2019-11-26 DIAGNOSIS — D496 Neoplasm of unspecified behavior of brain: Secondary | ICD-10-CM

## 2019-11-26 HISTORY — PX: CRANIOTOMY: SHX93

## 2019-11-26 HISTORY — PX: APPLICATION OF CRANIAL NAVIGATION: SHX6578

## 2019-11-26 HISTORY — PX: OPERATIVE ULTRASOUND: SHX5996

## 2019-11-26 LAB — CBC WITH DIFFERENTIAL/PLATELET
Abs Immature Granulocytes: 0.08 10*3/uL — ABNORMAL HIGH (ref 0.00–0.07)
Basophils Absolute: 0 10*3/uL (ref 0.0–0.1)
Basophils Relative: 0 %
Eosinophils Absolute: 0 10*3/uL (ref 0.0–0.5)
Eosinophils Relative: 0 %
HCT: 41.8 % (ref 36.0–46.0)
Hemoglobin: 14.4 g/dL (ref 12.0–15.0)
Immature Granulocytes: 1 %
Lymphocytes Relative: 6 %
Lymphs Abs: 0.6 10*3/uL — ABNORMAL LOW (ref 0.7–4.0)
MCH: 28.5 pg (ref 26.0–34.0)
MCHC: 34.4 g/dL (ref 30.0–36.0)
MCV: 82.6 fL (ref 80.0–100.0)
Monocytes Absolute: 0.6 10*3/uL (ref 0.1–1.0)
Monocytes Relative: 6 %
Neutro Abs: 9.7 10*3/uL — ABNORMAL HIGH (ref 1.7–7.7)
Neutrophils Relative %: 87 %
Platelets: 390 10*3/uL (ref 150–400)
RBC: 5.06 MIL/uL (ref 3.87–5.11)
RDW: 13.2 % (ref 11.5–15.5)
WBC: 11 10*3/uL — ABNORMAL HIGH (ref 4.0–10.5)
nRBC: 0 % (ref 0.0–0.2)

## 2019-11-26 LAB — COMPREHENSIVE METABOLIC PANEL
ALT: 22 U/L (ref 0–44)
AST: 15 U/L (ref 15–41)
Albumin: 3.3 g/dL — ABNORMAL LOW (ref 3.5–5.0)
Alkaline Phosphatase: 45 U/L (ref 38–126)
Anion gap: 9 (ref 5–15)
BUN: 16 mg/dL (ref 8–23)
CO2: 24 mmol/L (ref 22–32)
Calcium: 8.9 mg/dL (ref 8.9–10.3)
Chloride: 106 mmol/L (ref 98–111)
Creatinine, Ser: 0.76 mg/dL (ref 0.44–1.00)
GFR calc Af Amer: >60 mL/min (ref >60)
GFR calc non Af Amer: >60 mL/min (ref >60)
Glucose, Bld: 138 mg/dL — ABNORMAL HIGH (ref 70–99)
Potassium: 4.1 mmol/L (ref 3.5–5.1)
Sodium: 139 mmol/L (ref 135–145)
Total Bilirubin: 0.7 mg/dL (ref 0.3–1.2)
Total Protein: 6 g/dL — ABNORMAL LOW (ref 6.5–8.1)

## 2019-11-26 LAB — SURGICAL PCR SCREEN
MRSA, PCR: NEGATIVE
Staphylococcus aureus: NEGATIVE

## 2019-11-26 LAB — MAGNESIUM: Magnesium: 2 mg/dL (ref 1.7–2.4)

## 2019-11-26 LAB — PROTIME-INR
INR: 1.2 (ref 0.8–1.2)
Prothrombin Time: 14.6 seconds (ref 11.4–15.2)

## 2019-11-26 SURGERY — CRANIOTOMY TUMOR EXCISION
Anesthesia: General | Site: Head

## 2019-11-26 MED ORDER — OXYCODONE HCL 5 MG PO TABS: 5.0000 mg | ORAL_TABLET | Freq: Once | ORAL | Status: DC | PRN

## 2019-11-26 MED ORDER — MEPERIDINE HCL 25 MG/ML IJ SOLN: 6.2500 mg | INTRAMUSCULAR | Status: DC | PRN

## 2019-11-26 MED ORDER — ROCURONIUM BROMIDE 10 MG/ML (PF) SYRINGE
PREFILLED_SYRINGE | INTRAVENOUS | Status: AC
  Filled 2019-11-26: qty 10

## 2019-11-26 MED ORDER — POLYETHYLENE GLYCOL 3350 17 G PO PACK: 17.0000 g | PACK | Freq: Every day | ORAL | Status: DC | PRN

## 2019-11-26 MED ORDER — NICARDIPINE HCL IN NACL 20-0.86 MG/200ML-% IV SOLN
3.0000 mg/h | INTRAVENOUS | Status: DC
  Administered 2019-11-26 (×3): 5 mg/h via INTRAVENOUS
  Filled 2019-11-26 (×3): qty 200

## 2019-11-26 MED ORDER — PROMETHAZINE HCL 25 MG PO TABS: 12.5000 mg | ORAL_TABLET | ORAL | Status: DC | PRN

## 2019-11-26 MED ORDER — MORPHINE SULFATE (PF) 2 MG/ML IV SOLN
1.0000 mg | INTRAVENOUS | Status: DC | PRN
  Administered 2019-11-26: 2 mg via INTRAVENOUS
  Filled 2019-11-26: qty 1

## 2019-11-26 MED ORDER — VITAMIN B-12 1000 MCG PO TABS
2500.0000 ug | ORAL_TABLET | Freq: Every day | ORAL | Status: DC
  Administered 2019-11-27 – 2019-12-02 (×6): 2500 ug via ORAL
  Filled 2019-11-26: qty 5
  Filled 2019-11-26: qty 3
  Filled 2019-11-26: qty 2
  Filled 2019-11-26: qty 5
  Filled 2019-11-26: qty 3
  Filled 2019-11-26: qty 5

## 2019-11-26 MED ORDER — THROMBIN 5000 UNITS EX SOLR
OROMUCOSAL | Status: DC | PRN
  Administered 2019-11-26: 5 mL

## 2019-11-26 MED ORDER — MIDAZOLAM HCL 2 MG/2ML IJ SOLN
INTRAMUSCULAR | Status: AC
  Filled 2019-11-26: qty 2

## 2019-11-26 MED ORDER — ACETAMINOPHEN 650 MG RE SUPP: 650.0000 mg | RECTAL | Status: DC | PRN

## 2019-11-26 MED ORDER — THROMBIN 5000 UNITS EX SOLR
CUTANEOUS | Status: AC
  Filled 2019-11-26: qty 5000

## 2019-11-26 MED ORDER — FENTANYL CITRATE (PF) 250 MCG/5ML IJ SOLN
INTRAMUSCULAR | Status: DC | PRN
  Administered 2019-11-26: 150 ug via INTRAVENOUS
  Administered 2019-11-26: 100 ug via INTRAVENOUS

## 2019-11-26 MED ORDER — FLEET ENEMA 7-19 GM/118ML RE ENEM: 1.0000 | ENEMA | Freq: Once | RECTAL | Status: DC | PRN

## 2019-11-26 MED ORDER — SENNA 8.6 MG PO TABS
1.0000 | ORAL_TABLET | Freq: Two times a day (BID) | ORAL | Status: DC
  Administered 2019-11-27 – 2019-12-02 (×4): 8.6 mg via ORAL
  Filled 2019-11-26 (×10): qty 1

## 2019-11-26 MED ORDER — ONDANSETRON HCL 4 MG PO TABS: 4.0000 mg | ORAL_TABLET | ORAL | Status: DC | PRN

## 2019-11-26 MED ORDER — LABETALOL HCL 5 MG/ML IV SOLN
INTRAVENOUS | Status: DC | PRN
Start: 2019-11-26 — End: 2019-11-26
  Administered 2019-11-26 (×4): 5 mg via INTRAVENOUS

## 2019-11-26 MED ORDER — BACITRACIN ZINC 500 UNIT/GM EX OINT
TOPICAL_OINTMENT | CUTANEOUS | Status: AC
  Filled 2019-11-26: qty 28.35

## 2019-11-26 MED ORDER — PROMETHAZINE HCL 25 MG/ML IJ SOLN: 6.2500 mg | INTRAMUSCULAR | Status: DC | PRN

## 2019-11-26 MED ORDER — DEXAMETHASONE SODIUM PHOSPHATE 10 MG/ML IJ SOLN
INTRAMUSCULAR | Status: DC | PRN
  Administered 2019-11-26: 10 mg via INTRAVENOUS

## 2019-11-26 MED ORDER — SODIUM FLUORIDE 1.1 % DT CREA: 1.0000 "application " | TOPICAL_CREAM | Freq: Every day | DENTAL | Status: DC

## 2019-11-26 MED ORDER — ARTIFICIAL TEARS OPHTHALMIC OINT
TOPICAL_OINTMENT | OPHTHALMIC | Status: DC | PRN
Start: 2019-11-26 — End: 2019-11-26
  Administered 2019-11-26: 1 via OPHTHALMIC

## 2019-11-26 MED ORDER — CHLORHEXIDINE GLUCONATE CLOTH 2 % EX PADS
6.0000 | MEDICATED_PAD | Freq: Every day | CUTANEOUS | Status: DC
  Administered 2019-11-26 – 2019-12-02 (×4): 6 via TOPICAL

## 2019-11-26 MED ORDER — SUGAMMADEX SODIUM 200 MG/2ML IV SOLN
INTRAVENOUS | Status: DC | PRN
  Administered 2019-11-26: 200 mg via INTRAVENOUS

## 2019-11-26 MED ORDER — PROPOFOL 10 MG/ML IV BOLUS
INTRAVENOUS | Status: DC | PRN
  Administered 2019-11-26: 80 mg via INTRAVENOUS
  Administered 2019-11-26: 50 mg via INTRAVENOUS
  Administered 2019-11-26 (×2): 20 mg via INTRAVENOUS

## 2019-11-26 MED ORDER — ACETAMINOPHEN 325 MG PO TABS
650.0000 mg | ORAL_TABLET | ORAL | Status: DC | PRN
  Administered 2019-11-26 – 2019-11-27 (×2): 650 mg via ORAL
  Filled 2019-11-26 (×2): qty 2

## 2019-11-26 MED ORDER — CEFAZOLIN SODIUM-DEXTROSE 1-4 GM/50ML-% IV SOLN
1.0000 g | Freq: Three times a day (TID) | INTRAVENOUS | Status: AC
  Administered 2019-11-26 – 2019-11-27 (×3): 1 g via INTRAVENOUS
  Filled 2019-11-26 (×3): qty 50

## 2019-11-26 MED ORDER — PROPOFOL 10 MG/ML IV BOLUS
INTRAVENOUS | Status: AC
  Filled 2019-11-26: qty 40

## 2019-11-26 MED ORDER — 0.9 % SODIUM CHLORIDE (POUR BTL) OPTIME
TOPICAL | Status: DC | PRN
  Administered 2019-11-26: 2000 mL

## 2019-11-26 MED ORDER — ROCURONIUM BROMIDE 10 MG/ML (PF) SYRINGE
PREFILLED_SYRINGE | INTRAVENOUS | Status: DC | PRN
  Administered 2019-11-26: 40 mg via INTRAVENOUS
  Administered 2019-11-26: 60 mg via INTRAVENOUS
  Administered 2019-11-26 (×2): 20 mg via INTRAVENOUS

## 2019-11-26 MED ORDER — DEXAMETHASONE SODIUM PHOSPHATE 4 MG/ML IJ SOLN: 4.0000 mg | Freq: Four times a day (QID) | INTRAMUSCULAR | Status: DC

## 2019-11-26 MED ORDER — PHENYLEPHRINE HCL-NACL 10-0.9 MG/250ML-% IV SOLN
INTRAVENOUS | Status: DC | PRN
  Administered 2019-11-26: 30 ug/min via INTRAVENOUS

## 2019-11-26 MED ORDER — SODIUM CHLORIDE 0.9 % IV SOLN: INTRAVENOUS | Status: DC | PRN

## 2019-11-26 MED ORDER — OXYCODONE HCL 5 MG/5ML PO SOLN: 5.0000 mg | Freq: Once | ORAL | Status: DC | PRN

## 2019-11-26 MED ORDER — MIDAZOLAM HCL 2 MG/2ML IJ SOLN: 0.5000 mg | Freq: Once | INTRAMUSCULAR | Status: DC | PRN

## 2019-11-26 MED ORDER — HEMOSTATIC AGENTS (NO CHARGE) OPTIME
TOPICAL | Status: DC | PRN
  Administered 2019-11-26: 1

## 2019-11-26 MED ORDER — DEXAMETHASONE SODIUM PHOSPHATE 10 MG/ML IJ SOLN
6.0000 mg | Freq: Four times a day (QID) | INTRAMUSCULAR | Status: AC
  Administered 2019-11-26 – 2019-11-27 (×4): 6 mg via INTRAVENOUS
  Filled 2019-11-26 (×4): qty 1

## 2019-11-26 MED ORDER — DEXAMETHASONE SODIUM PHOSPHATE 10 MG/ML IJ SOLN
INTRAMUSCULAR | Status: AC
  Filled 2019-11-26: qty 1

## 2019-11-26 MED ORDER — DOCUSATE SODIUM 100 MG PO CAPS
100.0000 mg | ORAL_CAPSULE | Freq: Two times a day (BID) | ORAL | Status: DC
  Administered 2019-11-27 – 2019-12-02 (×5): 100 mg via ORAL
  Filled 2019-11-26 (×10): qty 1

## 2019-11-26 MED ORDER — ONDANSETRON HCL 4 MG/2ML IJ SOLN: 4.0000 mg | INTRAMUSCULAR | Status: DC | PRN

## 2019-11-26 MED ORDER — FENTANYL CITRATE (PF) 250 MCG/5ML IJ SOLN
INTRAMUSCULAR | Status: AC
  Filled 2019-11-26: qty 5

## 2019-11-26 MED ORDER — THROMBIN 20000 UNITS EX SOLR
CUTANEOUS | Status: DC | PRN
  Administered 2019-11-26: 20 mL

## 2019-11-26 MED ORDER — BACITRACIN ZINC 500 UNIT/GM EX OINT
TOPICAL_OINTMENT | CUTANEOUS | Status: DC | PRN
  Administered 2019-11-26: 1 via TOPICAL

## 2019-11-26 MED ORDER — PANTOPRAZOLE SODIUM 40 MG IV SOLR
40.0000 mg | Freq: Every day | INTRAVENOUS | Status: DC
  Administered 2019-11-26: 40 mg via INTRAVENOUS
  Filled 2019-11-26: qty 40

## 2019-11-26 MED ORDER — ONDANSETRON HCL 4 MG/2ML IJ SOLN
INTRAMUSCULAR | Status: AC
  Filled 2019-11-26: qty 2

## 2019-11-26 MED ORDER — THROMBIN 20000 UNITS EX SOLR
CUTANEOUS | Status: AC
  Filled 2019-11-26: qty 20000

## 2019-11-26 MED ORDER — MANNITOL 25 % IV SOLN
INTRAVENOUS | Status: DC | PRN
  Administered 2019-11-26: 34.5 g via INTRAVENOUS

## 2019-11-26 MED ORDER — LEVETIRACETAM IN NACL 500 MG/100ML IV SOLN
500.0000 mg | Freq: Two times a day (BID) | INTRAVENOUS | Status: DC
  Administered 2019-11-26 – 2019-11-27 (×2): 500 mg via INTRAVENOUS
  Filled 2019-11-26 (×2): qty 100

## 2019-11-26 MED ORDER — PRAVASTATIN SODIUM 10 MG PO TABS: 10.0000 mg | ORAL_TABLET | Freq: Every day | ORAL | Status: DC

## 2019-11-26 MED ORDER — LABETALOL HCL 5 MG/ML IV SOLN
10.0000 mg | INTRAVENOUS | Status: DC | PRN
  Administered 2019-11-26 – 2019-12-01 (×4): 10 mg via INTRAVENOUS
  Filled 2019-11-26 (×3): qty 4

## 2019-11-26 MED ORDER — HYDROMORPHONE HCL 1 MG/ML IJ SOLN: 0.2500 mg | INTRAMUSCULAR | Status: DC | PRN

## 2019-11-26 MED ORDER — ENOXAPARIN SODIUM 40 MG/0.4ML ~~LOC~~ SOLN
40.0000 mg | SUBCUTANEOUS | Status: DC
  Administered 2019-11-27 – 2019-12-02 (×6): 40 mg via SUBCUTANEOUS
  Filled 2019-11-26 (×6): qty 0.4

## 2019-11-26 MED ORDER — HYDROCODONE-ACETAMINOPHEN 5-325 MG PO TABS
1.0000 | ORAL_TABLET | ORAL | Status: DC | PRN
  Administered 2019-11-27 – 2019-11-28 (×4): 1 via ORAL
  Filled 2019-11-26 (×4): qty 1

## 2019-11-26 MED ORDER — POTASSIUM CHLORIDE IN NACL 20-0.9 MEQ/L-% IV SOLN
INTRAVENOUS | Status: DC
  Filled 2019-11-26 (×2): qty 1000

## 2019-11-26 MED ORDER — SODIUM CHLORIDE 0.9 % IV SOLN
INTRAVENOUS | Status: DC | PRN
  Administered 2019-11-26: 500 mg via INTRAVENOUS

## 2019-11-26 MED ORDER — ENALAPRILAT 1.25 MG/ML IV SOLN
1.2500 mg | Freq: Four times a day (QID) | INTRAVENOUS | Status: DC | PRN
  Administered 2019-11-26 – 2019-12-02 (×2): 1.25 mg via INTRAVENOUS
  Filled 2019-11-26 (×4): qty 1

## 2019-11-26 MED ORDER — LIDOCAINE 2% (20 MG/ML) 5 ML SYRINGE
INTRAMUSCULAR | Status: AC
  Filled 2019-11-26: qty 5

## 2019-11-26 MED ORDER — LIDOCAINE 2% (20 MG/ML) 5 ML SYRINGE
INTRAMUSCULAR | Status: DC | PRN
  Administered 2019-11-26: 40 mg via INTRAVENOUS

## 2019-11-26 MED ORDER — DEXAMETHASONE SODIUM PHOSPHATE 4 MG/ML IJ SOLN: 4.0000 mg | Freq: Three times a day (TID) | INTRAMUSCULAR | Status: DC

## 2019-11-26 MED ORDER — B-12 2500 MCG SL SUBL: 1.0000 | SUBLINGUAL_TABLET | Freq: Every day | SUBLINGUAL | Status: DC

## 2019-11-26 MED ORDER — LIDOCAINE-EPINEPHRINE 1 %-1:100000 IJ SOLN
INTRAMUSCULAR | Status: AC
  Filled 2019-11-26: qty 1

## 2019-11-26 MED ORDER — LIDOCAINE-EPINEPHRINE 1 %-1:100000 IJ SOLN
INTRAMUSCULAR | Status: DC | PRN
  Administered 2019-11-26: 10 mL

## 2019-11-26 MED ORDER — PHENYLEPHRINE 40 MCG/ML (10ML) SYRINGE FOR IV PUSH (FOR BLOOD PRESSURE SUPPORT)
PREFILLED_SYRINGE | INTRAVENOUS | Status: DC | PRN
  Administered 2019-11-26: 120 ug via INTRAVENOUS

## 2019-11-26 MED ORDER — DORZOLAMIDE HCL-TIMOLOL MAL 2-0.5 % OP SOLN: 1.0000 [drp] | Freq: Two times a day (BID) | OPHTHALMIC | Status: DC

## 2019-11-26 SURGICAL SUPPLY — 110 items
APL SKNCLS STERI-STRIP NONHPOA (GAUZE/BANDAGES/DRESSINGS)
BAND INSRT 18 STRL LF DISP RB (MISCELLANEOUS)
BAND RUBBER #18 3X1/16 STRL (MISCELLANEOUS) IMPLANT
BENZOIN TINCTURE PRP APPL 2/3 (GAUZE/BANDAGES/DRESSINGS) IMPLANT
BLADE CLIPPER SURG (BLADE) ×4 IMPLANT
BLADE SAW GIGLI 16 STRL (MISCELLANEOUS) IMPLANT
BLADE SURG 11 STRL SS (BLADE) ×2 IMPLANT
BLADE SURG 15 STRL LF DISP TIS (BLADE) IMPLANT
BLADE SURG 15 STRL SS (BLADE)
BNDG CMPR 75X41 PLY HI ABS (GAUZE/BANDAGES/DRESSINGS)
BNDG GAUZE ELAST 4 BULKY (GAUZE/BANDAGES/DRESSINGS) IMPLANT
BNDG STRETCH 4X75 STRL LF (GAUZE/BANDAGES/DRESSINGS) IMPLANT
BUR ACORN 9.0 PRECISION (BURR) ×3 IMPLANT
BUR ACORN 9.0MM PRECISION (BURR) ×1
BUR ROUND FLUTED 4 SOFT TCH (BURR) IMPLANT
BUR ROUND FLUTED 4MM SOFT TCH (BURR)
BUR SPIRAL ROUTER 2.3 (BUR) ×3 IMPLANT
BUR SPIRAL ROUTER 2.3MM (BUR) ×1
CANISTER SUCT 3000ML PPV (MISCELLANEOUS) ×8 IMPLANT
CATH VENTRIC 35X38 W/TROCAR LG (CATHETERS) IMPLANT
CLIP VESOCCLUDE MED 6/CT (CLIP) IMPLANT
CNTNR URN SCR LID CUP LEK RST (MISCELLANEOUS) ×2 IMPLANT
CONT SPEC 4OZ STRL OR WHT (MISCELLANEOUS) ×4
COVER BURR HOLE UNIV 10 (Orthopedic Implant) ×4 IMPLANT
COVER MAYO STAND STRL (DRAPES) IMPLANT
COVER WAND RF STERILE (DRAPES) ×4 IMPLANT
DECANTER SPIKE VIAL GLASS SM (MISCELLANEOUS) ×4 IMPLANT
DRAIN SUBARACHNOID (WOUND CARE) IMPLANT
DRAPE HALF SHEET 40X57 (DRAPES) ×4 IMPLANT
DRAPE MICROSCOPE LEICA (MISCELLANEOUS) IMPLANT
DRAPE NEUROLOGICAL W/INCISE (DRAPES) ×4 IMPLANT
DRAPE STERI IOBAN 125X83 (DRAPES) IMPLANT
DRAPE SURG 17X23 STRL (DRAPES) IMPLANT
DRAPE WARM FLUID 44X44 (DRAPES) ×4 IMPLANT
DRSG ADAPTIC 3X8 NADH LF (GAUZE/BANDAGES/DRESSINGS) IMPLANT
DRSG OPSITE POSTOP 4X8 (GAUZE/BANDAGES/DRESSINGS) ×2 IMPLANT
DRSG TELFA 3X8 NADH (GAUZE/BANDAGES/DRESSINGS) IMPLANT
DURAPREP 6ML APPLICATOR 50/CS (WOUND CARE) ×4 IMPLANT
ELECT COATED BLADE 2.86 ST (ELECTRODE) ×2 IMPLANT
ELECT REM PT RETURN 9FT ADLT (ELECTROSURGICAL) ×4
ELECTRODE REM PT RTRN 9FT ADLT (ELECTROSURGICAL) ×2 IMPLANT
EVACUATOR 1/8 PVC DRAIN (DRAIN) IMPLANT
EVACUATOR SILICONE 100CC (DRAIN) IMPLANT
FORCEPS BIPO MALIS IRRIG 9X1.5 (NEUROSURGERY SUPPLIES) ×2 IMPLANT
FORCEPS BIPOLAR SPETZLER 8 1.0 (NEUROSURGERY SUPPLIES) ×4 IMPLANT
GAUZE 4X4 16PLY RFD (DISPOSABLE) IMPLANT
GAUZE SPONGE 4X4 12PLY STRL (GAUZE/BANDAGES/DRESSINGS) IMPLANT
GLOVE BIO SURGEON STRL SZ7.5 (GLOVE) ×4 IMPLANT
GLOVE BIOGEL PI IND STRL 7.5 (GLOVE) ×2 IMPLANT
GLOVE BIOGEL PI INDICATOR 7.5 (GLOVE) ×2
GLOVE ECLIPSE 7.5 STRL STRAW (GLOVE) IMPLANT
GLOVE EXAM NITRILE LRG STRL (GLOVE) IMPLANT
GLOVE EXAM NITRILE XL STR (GLOVE) IMPLANT
GLOVE EXAM NITRILE XS STR PU (GLOVE) IMPLANT
GOWN STRL REUS W/ TWL LRG LVL3 (GOWN DISPOSABLE) ×4 IMPLANT
GOWN STRL REUS W/ TWL XL LVL3 (GOWN DISPOSABLE) IMPLANT
GOWN STRL REUS W/TWL 2XL LVL3 (GOWN DISPOSABLE) IMPLANT
GOWN STRL REUS W/TWL LRG LVL3 (GOWN DISPOSABLE) ×8
GOWN STRL REUS W/TWL XL LVL3 (GOWN DISPOSABLE)
HEMOSTAT POWDER KIT SURGIFOAM (HEMOSTASIS) ×4 IMPLANT
HEMOSTAT SURGICEL 2X14 (HEMOSTASIS) ×4 IMPLANT
HOOK DURA 1/2IN (MISCELLANEOUS) ×4 IMPLANT
HOOK RETRACTION 12 ELAST STAY (MISCELLANEOUS) ×4 IMPLANT
IV CATH AUTO 14GX1.75 SAFE ORG (IV SOLUTION) ×2 IMPLANT
IV NS 1000ML (IV SOLUTION) ×4
IV NS 1000ML BAXH (IV SOLUTION) ×2 IMPLANT
KIT BASIN OR (CUSTOM PROCEDURE TRAY) ×4 IMPLANT
KIT DRAIN CSF ACCUDRAIN (MISCELLANEOUS) IMPLANT
KIT NDL BIOPSY 1.8X235 CRAN (NEEDLE) ×2 IMPLANT
KIT NEEDLE BIOPSY 1.8X235 CRAN (NEEDLE) ×4 IMPLANT
KIT TURNOVER KIT B (KITS) ×4 IMPLANT
KIT VARIOGUIDE DRILL 1.9 (KITS) ×4 IMPLANT
MARKER SPHERE PSV REFLC 13MM (MARKER) ×8 IMPLANT
NDL SPNL 18GX3.5 QUINCKE PK (NEEDLE) IMPLANT
NEEDLE HYPO 22GX1.5 SAFETY (NEEDLE) ×4 IMPLANT
NEEDLE SPNL 18GX3.5 QUINCKE PK (NEEDLE) IMPLANT
NS IRRIG 1000ML POUR BTL (IV SOLUTION) ×12 IMPLANT
PACK CRANIOTOMY CUSTOM (CUSTOM PROCEDURE TRAY) ×4 IMPLANT
PAD DRESSING TELFA 3X8 NADH (GAUZE/BANDAGES/DRESSINGS) IMPLANT
PATTIES SURGICAL .25X.25 (GAUZE/BANDAGES/DRESSINGS) IMPLANT
PATTIES SURGICAL .5 X.5 (GAUZE/BANDAGES/DRESSINGS) IMPLANT
PATTIES SURGICAL .5 X3 (DISPOSABLE) IMPLANT
PATTIES SURGICAL 1/4 X 3 (GAUZE/BANDAGES/DRESSINGS) IMPLANT
PATTIES SURGICAL 1X1 (DISPOSABLE) IMPLANT
PIN MAYFIELD SKULL DISP (PIN) ×4 IMPLANT
PLATE CRANIAL 12 2H RIGID UNI (Plate) ×4 IMPLANT
SCREW UNIII AXS SD 1.5X4 (Screw) ×24 IMPLANT
SEALANT ADHERUS EXTEND TIP (MISCELLANEOUS) ×2 IMPLANT
SET CARTRIDGE AND TUBING (SET/KITS/TRAYS/PACK) ×2 IMPLANT
SET TUBING IRRIGATION DISP (TUBING) ×2 IMPLANT
SPECIMEN JAR SMALL (MISCELLANEOUS) ×2 IMPLANT
SPONGE NEURO XRAY DETECT 1X3 (DISPOSABLE) IMPLANT
SPONGE SURGIFOAM ABS GEL 100 (HEMOSTASIS) ×4 IMPLANT
STAPLER VISISTAT 35W (STAPLE) ×4 IMPLANT
SUT ETHILON 3 0 FSL (SUTURE) IMPLANT
SUT ETHILON 3 0 PS 1 (SUTURE) IMPLANT
SUT MNCRL AB 3-0 PS2 18 (SUTURE) IMPLANT
SUT NURALON 4 0 TR CR/8 (SUTURE) ×12 IMPLANT
SUT SILK 0 TIES 10X30 (SUTURE) IMPLANT
SUT VIC AB 2-0 CP2 18 (SUTURE) ×4 IMPLANT
TIP STANDARD 36KHZ (INSTRUMENTS) ×4
TIP STD 36KHZ (INSTRUMENTS) IMPLANT
TOWEL GREEN STERILE (TOWEL DISPOSABLE) ×4 IMPLANT
TOWEL GREEN STERILE FF (TOWEL DISPOSABLE) ×4 IMPLANT
TRAY FOLEY MTR SLVR 16FR STAT (SET/KITS/TRAYS/PACK) ×4 IMPLANT
TUBE CONNECTING 12'X1/4 (SUCTIONS) ×1
TUBE CONNECTING 12X1/4 (SUCTIONS) ×3 IMPLANT
UNDERPAD 30X36 HEAVY ABSORB (UNDERPADS AND DIAPERS) ×4 IMPLANT
WATER STERILE IRR 1000ML POUR (IV SOLUTION) ×4 IMPLANT
WRENCH TORQUE 36KHZ (INSTRUMENTS) ×2 IMPLANT

## 2019-11-26 NOTE — Anesthesia Procedure Notes (Signed)
Procedure Name: Intubation Date/Time: 11/26/2019 8:01 AM Performed by: Malachi Paradise, RN Pre-anesthesia Checklist: Patient identified, Emergency Drugs available, Suction available and Patient being monitored Patient Re-evaluated:Patient Re-evaluated prior to induction Oxygen Delivery Method: Circle System Utilized Preoxygenation: Pre-oxygenation with 100% oxygen Induction Type: IV induction Ventilation: Mask ventilation without difficulty Laryngoscope Size: Mac and 4 Grade View: Grade I Tube type: Oral Tube size: 7.5 mm Number of attempts: 1 Airway Equipment and Method: Stylet and Oral airway Placement Confirmation: ETT inserted through vocal cords under direct vision,  positive ETCO2 and breath sounds checked- equal and bilateral Secured at: 20 cm Tube secured with: Tape Dental Injury: Teeth and Oropharynx as per pre-operative assessment  Comments: Intubation by Arvella Nigh

## 2019-11-26 NOTE — Progress Notes (Signed)
SLP Cancellation Note  Patient Details Name: JACQULENE HUNTLEY MRN: [id number] DOB: 1951/03/16   Cancelled treatment:       Reason Eval/Treat Not Completed: Medical issues which prohibited therapy. Order received for swallow evaluation. Pt to have surgery today. Will f/u post-procedure as able.   Osie Bond., M.A. [facility] Pager [phone] Office [phone]  11/26/2019, 7:14 AM

## 2019-11-26 NOTE — Progress Notes (Signed)
Pt transferred to [address] about 1415. She is alert and oriented, MAE. Daughter [name] notified of new room number. Belongings from [address] brought over.

## 2019-11-26 NOTE — Op Note (Addendum)
Procedure(s): CRANIOTOMY HEMATOMA EVACUATION SUBDURAL Procedure Note  Wanda Ross female 69 y.o. 11/26/2019  Procedure: 1.  Left craniotomy for resection parietal and splenial mass 2.  Use of computer-assisted neuro navigation    Surgeon(s) and Role:    Marcello Moores, Dorcas Carrow, MD - Primary Dr. Newman Pies, MD.- Assisting.    Indications: This is a 69 year old woman who presented to the hospital for progressive cognitive decline and behavioral abnormality as well as speech difficulty.  She was found to have a infiltrating mass with mass-effect in her deep parietal lobe and in the splenium of the corpus callosum.  Metastatic work-up was negative.  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  Risks, benefits, alternatives, and expected convalescence were discussed with the patient and her daughter.  Risks discussed included but were not limited to bleeding, pain, infection, seizure, stroke, scar, subdural recurrence, neurologic deficit, coma, and death.  I specifically mentioned the possibility of disconnection syndromes, Gerstmann syndrome, visual field problems, and speech difficulties as potential aspects of her postoperative recovery.  Informed consent was obtained.  Surgeon: [name]   Assistants: Newman Pies, MD. Dr. Arnoldo Morale provided critical assistance with the resection.  No qualified trainees were available to assist with the procedure.  Anesthesia: General endotracheal anesthesia   Procedure in detail:  The patient was brought to the operating room.  A timeout was performed.  General anesthesia was induced and patient was intubated by the anesthesia service.  After appropriate lines and monitors were placed, patient was placed in the lateral decubitus position on a beanbag with all pressure points padded and eyes protected.  The head was placed in a [place] head holder  and affixed to the bed.  A preoperative thin cut MRI was reconstructed into a 3D image.  This allowed for surface match registration using the [place] system for neuro navigation.  A trajectory through the superior parietal lobule was selected and a linear incision was planned on the scalp using neuro navigation.  The scalp was clipped, preprepped with alcohol and prepped and draped in sterile fashion. 1% lidocaine with epinephrine injected into the planned incision.  A timeout was performed.  Preoperative antibiotics, dexamethasone, Keppra, and mannitol were given.  Incision was made with a 10 blade.  The periosteum was opened sharply and dissected off the skull.  Self-retaining retractors were placed.  High-speed drill was used to perform 2 bur holes which were used to dissect the dura from the inner table of the skull.  Craniotome was used to perform a craniotomy.  Meticulous epidural hemostasis was obtained.  The dura was opened in a C-shaped manner.  The brain was inspected.  A deep sulcus in the superior parietal lobule was selected for entry.  The arachnoid was cut sharply and intrasulcal dissection performed down to the base of the sulcus.  There, the pia was coagulated and cut sharply.  The neuro navigation was used to confirm appropriate trajectory to the tumor, which was approximately 1 cm away from the depth of the sulcus.  Tumor was encountered and was purplish gray in appearance and somewhat diaphanous.  Extracapsular dissection of the white matter was performed with suction and bipolar cautery to cauterize small white matter feeders to the tumor.  The posterior aspect of the tumor was dissected until it reached the pial surface of the falx.  There it was separated from pial feeders until the end of the falx,  where the splenium of the corpus callosum was seen.  The lateral aspect of the tumor was then dissected until the ependymal wall was encountered.  Ependymal feeders were cut and coagulated.  The  superior aspect of the tumor similarly was freed from the white matter fibers and the involved ependymal wall was resected, with cauterization of small ependymal wall feeders.  Similarly, the inferior aspect was freed in this manner.  The tumor extending in the corpus callosum was then truncated to allow the bulk of the tumor to be removed in 1 piece.  A small part was cut and sent to frozen section which was consistent with high-grade glioma.  Attention was then turned to the corpus callosum, where the tumor was more infiltrative rather than grayish-purple, but was still hypervascular.  This tumor was resected with combination of suction and bipolar emulsification.  This portion of the tumor, being more infiltrative, did not have a discrete endpoint at its medial aspect, so combination of more normal-appearing white matter which correlated with neuro navigation was used to confirm good resection of this portion.  Meticulous hemostasis was obtained.  Ultrasound was then used and confirmed good resection with no gross residual tumor.  The dura was then closed with 4-0 Nurolon in a running fashion.  The stitch line was reinforced with Adherin dural spray.  Meticulous epidural hemostasis was obtained.  The bone flap was replaced with a [employer] cranial plating system.  The galea was closed with 2-0 Vicryl sutures in buried fashion.  Skin was closed with staples.  Sterile dressing was then placed on the incision.  Patient was then removed from the [place] head holder and extubated by the anesthesia service moving all extremities.  All counts were correct at the end of surgery.  No complications were noted.  Findings: High-grade glioma  Estimated Blood Loss:  less than 100 mL         Drains: none         Specimens: corpus callosum mass         Implants: Stryker plating        Complications:  * No complications entered in OR log *         Disposition: PACU - hemodynamically stable.         Condition:  stable

## 2019-11-26 NOTE — Progress Notes (Signed)
Neurosurgery  Patient is postop day number zero from craniotomy which was completed this morning. Postoperatively, she is doing well, following commands in all extremities with no drift. She is alert and oriented x 3. There is cognitive slowing which is stable from preoperatively. She has a right visual field deficit, which is expected after the surgery. Will plan for MRI tomorrow.

## 2019-11-26 NOTE — Anesthesia Procedure Notes (Signed)
Arterial Line Insertion Start/End6/30/2021 7:03 AM, 11/26/2019 7:10 AM Performed by: CRNA  Patient location: Pre-op. Preanesthetic checklist: patient identified, IV checked, site marked, risks and benefits discussed, surgical consent, monitors and equipment checked, pre-op evaluation, timeout performed and anesthesia consent Lidocaine 1% used for infiltration Right, radial was placed Catheter size: 20 Fr Hand hygiene performed  and maximum sterile barriers used   Attempts: 1 Procedure performed without using ultrasound guided technique. Following insertion, dressing applied and Biopatch. Post procedure assessment: normal and unchanged  Patient tolerated the procedure well with no immediate complications. Additional procedure comments: Arterial line insertion by Arvella Nigh.

## 2019-11-26 NOTE — Anesthesia Postprocedure Evaluation (Signed)
Anesthesia Post Note  Patient: Wanda Ross  Procedure(s) Performed: LEFT PARIETAL CRANIOTOMY FOR RESECTION OF TUMOR (Left Head) APPLICATION OF CRANIAL NAVIGATION (Left Head) OPERATIVE ULTRASOUND (N/A )     Patient location during evaluation: PACU Anesthesia Type: General Level of consciousness: awake and alert, oriented and patient cooperative Pain management: pain level controlled Vital Signs Assessment: post-procedure vital signs reviewed and stable Respiratory status: spontaneous breathing, nonlabored ventilation and respiratory function stable Cardiovascular status: blood pressure returned to baseline and stable Postop Assessment: no apparent nausea or vomiting Anesthetic complications: no   No complications documented.  Last Vitals:  Vitals:   11/26/19 1339 11/26/19 1342  BP:    Pulse: 78 77  Resp: 12 15  Temp:    SpO2: 100% 100%    Last Pain:  Vitals:   11/26/19 0300  TempSrc: Oral  PainSc:                  Jazel Nimmons,E. Therasa Lorenzi

## 2019-11-26 NOTE — Progress Notes (Signed)
[facility]                                                                              Patient Demographics  Wanda Ross, is a 69 y.o. female, DOB - 1950/09/28, ELF:[id number]  Admit date - 11/21/2019   Admitting Physician Oswald Hillock, MD  Outpatient Primary MD for the patient is Fayrene Helper, MD  Outpatient specialists:   LOS - 4  days   Medical records reviewed and are as summarized below:    Chief Complaint  Patient presents with  . Altered Mental Status       Brief summary   Per HPI: [name],with history of glaucoma, hyperlipidemia, hypertension was brought to the ED due to confusion and gait imbalance. As per patient daughter, patient was having intermittent confusion.Patient denies chest pain, shortness of breath, nausea, vomiting or diarrhea. Denies blurred vision. No speech disturbance. Patient complained of headache, poor appetite for past 2 weeks. 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 Denies abdominal pain or dysuria, Denies weight loss. No previous history of malignancy In the ED CT of the head was obtained which showed ill-defined 3.2 cm mass within the left parietal white matter, compatible malignancy. 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 Extensive vasogenic edema noted throughout the left cerebral hemisphere with significant sulcal effacement and minimal rightward midline shift. Neurosurgeon Dr. Marcello Moores was consulted by ED physician who recommended Decadron, MRI brain with and without contrast and transfer to [place]    Brain mass/glioma-new diagnosis -CT head showed brain mass with vasogenic edema throughout the left cerebral hemisphere with minimal rightward midline shift -Patient was started on Decadron, no acute neurological deficits were noted, neurosurgery was consulted. -CT chest abdomen and pelvis showed no acute findings within the chest abdomen and pelvis, no  evidence of primary malignancy.  Baseline EKG reviewed, no acute ST-T wave changes. -Patient underwent OR today, status post left craniotomy for resection of parietal and splenial mass -Will follow biopsy and further neurosurgery recommendation -Patient seen after the surgery, alert and oriented, no new neurological deficits, nicardipine drip currently off  Abnormal UA -Urine culture showed 60,000 colonies staph epidermidis -Hold off on antibiotics, no fevers, alert and oriented  Essential hypertension -Resume oral antihypertensives when cleared by neurosurgery.  Currently vital signs stable, nicardipine drip off  Hyperlipidemia Continue pravastatin   Code Status: Full DVT Prophylaxis:  SCD's Family Communication: Discussed all imaging results, lab results, explained to the patient and patient's daughter Wanda Ross on the phone.    Disposition Plan:     Status is: Inpatient  Remains inpatient appropriate because:Inpatient level of care appropriate due to severity of illness   Dispo: The patient is from: Home              Anticipated d/c is to: TBD              Anticipated d/c date is: > 3 days              Patient currently is not medically stable to d/c.      Time Spent in minutes 25 minutes  Procedures:  Procedure: 1.  Left craniotomy for resection parietal and splenial mass 2.  Use of computer-assisted neuro navigation   Consultants:   Neurosurgery  Antimicrobials:   Anti-infectives (From admission, onward)   Start     Dose/Rate Route Frequency Ordered Stop   11/26/19 1600  ceFAZolin (ANCEF) IVPB 1 g/50 mL premix     Discontinue     1 g 100 mL/hr over 30 Minutes Intravenous Every 8 hours 11/26/19 1404 11/27/19 1559   11/26/19 0600  ceFAZolin (ANCEF) IVPB 2g/100 mL premix        2 g 200 mL/hr over 30 Minutes Intravenous To Short Stay 11/25/19 1804 11/26/19 0810   11/24/19 1530  clindamycin (CLEOCIN) capsule 300 mg  Status:  Discontinued        300 mg Oral 2  times daily 11/24/19 1518 11/25/19 0929   11/23/19 1400  ampicillin-sulbactam (UNASYN) 1.5 g in sodium chloride 0.9 % 100 mL IVPB  Status:  Discontinued        1.5 g 200 mL/hr over 30 Minutes Intravenous Every 6 hours 11/23/19 1325 11/24/19 1518   11/22/19 0800  cefTRIAXone (ROCEPHIN) 1 g in sodium chloride 0.9 % 100 mL IVPB  Status:  Discontinued        1 g 200 mL/hr over 30 Minutes Intravenous Every 24 hours 11/22/19 0320 11/22/19 0444   11/22/19 0445  cefTRIAXone (ROCEPHIN) 1 g in sodium chloride 0.9 % 100 mL IVPB  Status:  Discontinued        1 g 200 mL/hr over 30 Minutes Intravenous Every 24 hours 11/22/19 0444 11/23/19 1325   11/21/19 2345  cephALEXin (KEFLEX) capsule 500 mg        500 mg Oral  Once 11/21/19 2330 11/22/19 0034         Medications  Scheduled Meds: . amLODipine  5 mg Oral Daily  . Chlorhexidine Gluconate Cloth  6 each Topical Daily  . cloNIDine  0.3 mg Oral QHS  . dexamethasone (DECADRON) injection  6 mg Intravenous Q6H   Followed by  . [START ON 11/27/2019] dexamethasone (DECADRON) injection  4 mg Intravenous Q6H   Followed by  . [START ON 11/28/2019] dexamethasone (DECADRON) injection  4 mg Intravenous Q8H  . docusate sodium  100 mg Oral BID  . dorzolamide-timolol  1 drop Right Eye BID  . [START ON 11/27/2019] enoxaparin (LOVENOX) injection  40 mg Subcutaneous Q24H  . pantoprazole (PROTONIX) IV  40 mg Intravenous QHS  . pravastatin  10 mg Oral q1800  . senna  1 tablet Oral BID  . vitamin B-12  2,500 mcg Oral Daily   Continuous Infusions: . 0.9 % NaCl with KCl 20 mEq / L    .  ceFAZolin (ANCEF) IV    . levETIRAcetam    . niCARDipine 5 mg/hr (11/26/19 1321)   PRN Meds:.acetaminophen **OR** acetaminophen, enalaprilat, HYDROcodone-acetaminophen, labetalol, morphine injection, ondansetron **OR** ondansetron (ZOFRAN) IV, polyethylene glycol, promethazine, sodium phosphate      Subjective:   Wanda Ross was seen and examined today.  Patient seen  postop after the surgery, alert and oriented, doing well.  No acute neuro deficits.  No headache or blurry vision.  No new focal weakness.   Objective:   Vitals:   11/26/19 1330 11/26/19 1336 11/26/19 1339 11/26/19 1342  BP: 114/63 (!) 124/59    Pulse: 74  78 77  Resp: 10 13 12 15   Temp: 97.7 F (36.5 C)     TempSrc:  SpO2: 99% 96% 100% 100%  Weight:      Height:        Intake/Output Summary (Last 24 hours) at 11/26/2019 1449 Last data filed at 11/26/2019 1330 Gross per 24 hour  Intake 1900 ml  Output 2075 ml  Net -175 ml     Wt Readings from Last 3 Encounters:  11/21/19 68.9 kg  10/22/19 68.9 kg  06/04/19 68.9 kg   Physical Exam  General: Alert and oriented x 3, NAD, seen postop  Cardiovascular: S1 S2 clear, RRR. No pedal edema b/l  Respiratory: CTAB, no wheezing, rales or rhonchi  Gastrointestinal: Soft, nontender, nondistended, NBS  Ext: no pedal edema bilaterally  Neuro: no new deficits  Musculoskeletal: No cyanosis, clubbing  Skin: No rashes  Psych: Normal affect and demeanor, alert and oriented x3      Data Reviewed:  I have personally reviewed following labs and imaging studies  Micro Results Recent Results (from the past 240 hour(s))  Urine culture     Status: Abnormal   Collection Time: 11/21/19 10:48 PM   Specimen: Urine, Clean Catch  Result Value Ref Range Status   Specimen Description   Final    URINE, CLEAN CATCH Performed at [facility], [address], [place], [place] [address]    Special Requests   Final    NONE Performed at [facility], [address], [place], [place] [address]    Culture (A)  Final    60,000 COLONIES/mL GROUP B STREP(S.AGALACTIAE)ISOLATED TESTING AGAINST S. AGALACTIAE NOT ROUTINELY PERFORMED DUE TO PREDICTABILITY OF AMP/PEN/VAN SUSCEPTIBILITY. 30,000 COLONIES/mL STAPHYLOCOCCUS EPIDERMIDIS    Report Status 11/24/2019 FINAL  Final   Organism ID, Bacteria STAPHYLOCOCCUS EPIDERMIDIS (A)  Final       Susceptibility   Staphylococcus epidermidis - MIC*    CIPROFLOXACIN <=0.5 SENSITIVE Sensitive     GENTAMICIN <=0.5 SENSITIVE Sensitive     NITROFURANTOIN <=16 SENSITIVE Sensitive     OXACILLIN >=4 RESISTANT Resistant     TETRACYCLINE <=1 SENSITIVE Sensitive     VANCOMYCIN 2 SENSITIVE Sensitive     TRIMETH/SULFA <=10 SENSITIVE Sensitive     CLINDAMYCIN <=0.25 SENSITIVE Sensitive     RIFAMPIN <=0.5 SENSITIVE Sensitive     Inducible Clindamycin NEGATIVE Sensitive     * 30,000 COLONIES/mL STAPHYLOCOCCUS EPIDERMIDIS  SARS Coronavirus 2 by RT PCR (hospital order, performed in [place] hospital lab) Nasopharyngeal Nasopharyngeal Swab     Status: None   Collection Time: 11/22/19 12:41 AM   Specimen: Nasopharyngeal Swab  Result Value Ref Range Status   SARS Coronavirus 2 NEGATIVE NEGATIVE Final    Comment: (NOTE) SARS-CoV-2 target nucleic acids are NOT DETECTED.  The SARS-CoV-2 RNA is generally detectable in upper and lower respiratory specimens during the acute phase of infection. The lowest concentration of SARS-CoV-2 viral copies this assay can detect is 250 copies / mL. A negative result does not preclude SARS-CoV-2 infection and should not be used as the sole basis for treatment or other patient management decisions.  A negative result may occur with improper specimen collection / handling, submission of specimen other than nasopharyngeal swab, presence of viral mutation(s) within the areas targeted by this assay, and inadequate number of viral copies (<250 copies / mL). A negative result must be combined with clinical observations, patient history, and epidemiological information.  Fact Sheet for Patients:   StrictlyIdeas.no  Fact Sheet for Healthcare Providers: BankingDealers.co.za  This test is not yet approved or  cleared by the [place] FDA and  has been authorized for detection and/or diagnosis of SARS-CoV-2 by FDA under  an Emergency Use Authorization (EUA).  This EUA will remain in effect (meaning this test can be used) for the duration of the COVID-19 declaration under Section 564(b)(1) of the Act, 21 U.S.C. section 360bbb-3(b)(1), unless the authorization is terminated or revoked sooner.  Performed at [facility], [address] [place], [place] [address]   Surgical PCR screen     Status: None   Collection Time: 11/26/19  5:45 AM   Specimen: Nasal Mucosa; Nasal Swab  Result Value Ref Range Status   MRSA, PCR NEGATIVE NEGATIVE Final   Staphylococcus aureus NEGATIVE NEGATIVE Final    Comment: (NOTE) The Xpert SA Assay (FDA approved for NASAL specimens in patients 62 years of age and older), is one component of a comprehensive surveillance program. It is not intended to diagnose infection nor to guide or monitor treatment. Performed at [facility], [place] [address], [place], [place] [address]     Radiology Reports DG Chest 2 View  Result Date: 11/22/2019 CLINICAL DATA:  Encephalopathy.  Newly discovered brain mass. EXAM: CHEST - 2 VIEW COMPARISON:  None. FINDINGS: The heart size and mediastinal contours are within normal limits. Both lungs are clear. The visualized skeletal structures are unremarkable. IMPRESSION: No active cardiopulmonary disease. Electronically Signed   By: Ulyses Jarred M.D.   On: 11/22/2019 04:03   CT Head Wo Contrast  Result Date: 11/22/2019 CLINICAL DATA:  Altered level of consciousness for 4 days, dizziness EXAM: CT HEAD WITHOUT CONTRAST TECHNIQUE: Contiguous axial images were obtained from the base of the skull through the vertex without intravenous contrast. COMPARISON:  None. FINDINGS: Brain: There is significant vasogenic edema in the left frontal parietal region. 3.2 cm mass is seen within the left parietal white matter. MRI with and without contrast is recommended for further evaluation. Hypodensity extends along the posterior aspect of the corpus callosum, either  extension of the mass or edema. I do not see any definite hemorrhage. There is significant effacement of the left lateral ventricle, with rightward midline shift measuring up to 4 mm at the septum pellucidum. There is diffuse sulcal effacement throughout the left cerebral hemisphere, greatest along the convexity. No acute extra-axial fluid collections. Vascular: No hyperdense vessel or unexpected calcification. Skull: Normal. Negative for fracture or focal lesion. Sinuses/Orbits: Small metallic densities are seen within the globes bilaterally, likely related to prior glaucoma surgery. Please correlate with surgical history. Otherwise the orbits are unremarkable. Other: None. IMPRESSION: 1. 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 MRI with and without contrast is recommended. 2. Extensive vasogenic edema throughout the left cerebral hemisphere, with significant sulcal effacement and minimal rightward midline shift. Significant effacement of the left lateral ventricle. These results were called by telephone at the time of interpretation on 11/22/2019 at 12:34 am to provider IVA KNAPP , who verbally acknowledged these results. 1. Extensive cerebral edema Electronically Signed   By: Randa Ngo M.D.   On: 11/22/2019 00:34   CT CHEST W CONTRAST  Result Date: 11/23/2019 CLINICAL DATA:  69 y.o. female was brought to the ED due to confusion and gait imbalance. As per patient daughter, patient was having intermittent confusion. Patient denies chest pain, shortness of breath, nausea, vomiting or diarrhea. Mass seen on CT Head and Mri. EXAM: CT CHEST, ABDOMEN, AND PELVIS WITH CONTRAST TECHNIQUE: Multidetector CT imaging of the  chest, abdomen and pelvis was performed following the standard protocol during bolus administration of intravenous contrast. CONTRAST:  126mL OMNIPAQUE IOHEXOL 300 MG/ML  SOLN  COMPARISON:  Head CT and brain MRI from 11/22/2019 FINDINGS: CT CHEST FINDINGS Cardiovascular: Heart is normal in size and configuration. No pericardial effusion. No coronary artery calcifications. Great vessels are normal in caliber. No aortic atherosclerosis. Mediastinum/Nodes: No enlarged mediastinal, hilar, or axillary lymph nodes. Thyroid gland, trachea, and esophagus demonstrate no significant findings. Lungs/Pleura: Minor scarring at the base of the left upper lobe lingula. Minimal linear dependent lower lobe opacities consistent with atelectasis. Lungs otherwise clear with no masses or nodules. No pleural effusion or pneumothorax. Musculoskeletal: No fracture or acute finding. No osteoblastic or osteolytic lesions. CT ABDOMEN PELVIS FINDINGS Hepatobiliary: Liver normal in size and overall attenuation. Small low-density lesion between the anterior segment the right lobe and medial segment of the left, consistent with a cyst. Small amount of focal fat adjacent to the falciform ligament. No other abnormalities. Normal gallbladder. No bile duct dilation. Pancreas: Unremarkable. No pancreatic ductal dilatation or surrounding inflammatory changes. Spleen: Normal in size without focal abnormality. Adrenals/Urinary Tract: No adrenal masses. Kidneys normal in size, orientation and position with symmetric enhancement and excretion. Bilateral low-attenuation renal masses consistent with cysts, largest from the lower pole of the left kidney, 4.5 cm. No renal stones. No hydronephrosis. Normal ureters. Normal bladder. Stomach/Bowel: Small hiatal hernia. Stomach is otherwise within normal limits. Appendix appears normal. No evidence of bowel wall thickening, distention, or inflammatory changes. Vascular/Lymphatic: No significant vascular abnormality. No enlarged lymph nodes. Reproductive: Uterus and bilateral adnexa are unremarkable. Other: No abdominal wall hernia or abnormality. No abdominopelvic ascites.  Musculoskeletal: No fracture or acute finding. No osteoblastic or osteolytic lesions. IMPRESSION: 1. No acute findings within the chest, abdomen or pelvis. 2. No evidence of a primary malignancy within the chest, abdomen or pelvis. 3. Small low-density liver lesion consistent with a cyst. Bilateral renal cysts. Electronically Signed   By: Lajean Manes M.D.   On: 11/23/2019 14:38   MR BRAIN W WO CONTRAST  Result Date: 11/23/2019 CLINICAL DATA:  Intracranial mass EXAM: MRI HEAD WITHOUT AND WITH CONTRAST TECHNIQUE: Multiplanar, multiecho pulse sequences of the brain and surrounding structures were obtained without and with intravenous contrast. CONTRAST:  68mL GADAVIST GADOBUTROL 1 MMOL/ML IV SOLN COMPARISON:  None. FINDINGS: Brain: There is a peripherally and heterogeneously contrast-enhancing mass within the left parietal white matter extending along the corpus callosum splenium. Mass measures 4.8 x 3.3 x 3.0 cm. There is moderate surrounding hyperintense T2-weighted signal a crosses into the right hemisphere. Rightward midline shift measures 3 mm. There is petechial hemorrhage within the mass. Vascular: Normal flow voids. Skull and upper cervical spine: Normal marrow signal. Sinuses/Orbits: Negative. Other: None. IMPRESSION: 1. Heterogeneously contrast-enhancing mass within the left parietal white matter extending along the corpus callosum splenium, most consistent with high-grade glioma. 2. Moderate surrounding hyperintense T2-weighted signal, likely a combination of edema and nonenhancing tumor. 3. 3 mm of rightward midline shift. 4. Intratumoral petechial hemorrhage. Electronically Signed   By: Ulyses Jarred M.D.   On: 11/23/2019 00:09   [place] Intraoperative  Result Date: 11/26/2019 CLINICAL DATA:  Ultrasound was provided for use by the ordering physician, and a technical charge was applied by the performing facility.  No radiologist interpretation/professional services rendered.   CT ABDOMEN PELVIS W  CONTRAST  Result Date: 11/23/2019 CLINICAL DATA:  69 y.o. female was brought to the ED due to confusion and gait imbalance.  As per patient daughter, patient was having intermittent confusion. Patient denies chest pain, shortness of breath, nausea, vomiting or diarrhea. Mass seen on CT Head and Mri. EXAM: CT CHEST, ABDOMEN, AND PELVIS WITH CONTRAST TECHNIQUE: Multidetector CT imaging of the chest, abdomen and pelvis was performed following the standard protocol during bolus administration of intravenous contrast. CONTRAST:  163mL OMNIPAQUE IOHEXOL 300 MG/ML  SOLN COMPARISON:  Head CT and brain MRI from 11/22/2019 FINDINGS: CT CHEST FINDINGS Cardiovascular: Heart is normal in size and configuration. No pericardial effusion. No coronary artery calcifications. Great vessels are normal in caliber. No aortic atherosclerosis. Mediastinum/Nodes: No enlarged mediastinal, hilar, or axillary lymph nodes. Thyroid gland, trachea, and esophagus demonstrate no significant findings. Lungs/Pleura: Minor scarring at the base of the left upper lobe lingula. Minimal linear dependent lower lobe opacities consistent with atelectasis. Lungs otherwise clear with no masses or nodules. No pleural effusion or pneumothorax. Musculoskeletal: No fracture or acute finding. No osteoblastic or osteolytic lesions. CT ABDOMEN PELVIS FINDINGS Hepatobiliary: Liver normal in size and overall attenuation. Small low-density lesion between the anterior segment the right lobe and medial segment of the left, consistent with a cyst. Small amount of focal fat adjacent to the falciform ligament. No other abnormalities. Normal gallbladder. No bile duct dilation. Pancreas: Unremarkable. No pancreatic ductal dilatation or surrounding inflammatory changes. Spleen: Normal in size without focal abnormality. Adrenals/Urinary Tract: No adrenal masses. Kidneys normal in size, orientation and position with symmetric enhancement and excretion. Bilateral low-attenuation  renal masses consistent with cysts, largest from the lower pole of the left kidney, 4.5 cm. No renal stones. No hydronephrosis. Normal ureters. Normal bladder. Stomach/Bowel: Small hiatal hernia. Stomach is otherwise within normal limits. Appendix appears normal. No evidence of bowel wall thickening, distention, or inflammatory changes. Vascular/Lymphatic: No significant vascular abnormality. No enlarged lymph nodes. Reproductive: Uterus and bilateral adnexa are unremarkable. Other: No abdominal wall hernia or abnormality. No abdominopelvic ascites. Musculoskeletal: No fracture or acute finding. No osteoblastic or osteolytic lesions. IMPRESSION: 1. No acute findings within the chest, abdomen or pelvis. 2. No evidence of a primary malignancy within the chest, abdomen or pelvis. 3. Small low-density liver lesion consistent with a cyst. Bilateral renal cysts. Electronically Signed   By: Lajean Manes M.D.   On: 11/23/2019 14:38    Lab Data:  CBC: Recent Labs  Lab 11/21/19 2137 11/22/19 0537 11/23/19 0837 11/24/19 0306 11/26/19 0445  WBC 7.1 6.3 15.6* 13.9* 11.0*  NEUTROABS 5.1  --  14.1* 12.2* 9.7*  HGB 14.8 14.8 15.2* 13.6 14.4  HCT 43.5 43.8 44.3 40.2 41.8  MCV 84.8 84.6 84.1 84.3 82.6  PLT 356 377 454* 382 654   Basic Metabolic Panel: Recent Labs  Lab 11/22/19 0537 11/23/19 0837 11/24/19 0306 11/25/19 0312 11/25/19 0719 11/26/19 0445  NA 140 140 140 139  --  139  K 4.0 3.9 3.7 4.1  --  4.1  CL 104 103 106 106  --  106  CO2 25 24 24 25   --  24  GLUCOSE 138* 123* 126* 127*  --  138*  BUN 10 14 16 14   --  16  CREATININE 0.65 0.79 0.83 0.70  --  0.76  CALCIUM 9.0 8.7* 8.2* 8.6*  --  8.9  MG 1.9 2.1  --   --  2.1 2.0   GFR: Estimated Creatinine Clearance: 65.1 mL/min (by C-G formula based on SCr of 0.76 mg/dL). Liver Function Tests: Recent Labs  Lab 11/21/19 2137 11/22/19 6503 11/23/19 5465 11/26/19 0445  AST 37 32 24 15  ALT 35 34 29 22  ALKPHOS 52 53 53 45  BILITOT 0.4  0.7 0.4 0.7  PROT 7.2 7.4 7.1 6.0*  ALBUMIN 4.3 4.4 3.8 3.3*   No results for input(s): LIPASE, AMYLASE in the last 168 hours. No results for input(s): AMMONIA in the last 168 hours. Coagulation Profile: Recent Labs  Lab 11/25/19 0719 11/26/19 0445  INR 1.1 1.2   Cardiac Enzymes: No results for input(s): CKTOTAL, CKMB, CKMBINDEX, TROPONINI in the last 168 hours. BNP (last 3 results) No results for input(s): PROBNP in the last 8760 hours. HbA1C: No results for input(s): HGBA1C in the last 72 hours. CBG: No results for input(s): GLUCAP in the last 168 hours. Lipid Profile: No results for input(s): CHOL, HDL, LDLCALC, TRIG, CHOLHDL, LDLDIRECT in the last 72 hours. Thyroid Function Tests: No results for input(s): TSH, T4TOTAL, FREET4, T3FREE, THYROIDAB in the last 72 hours. Anemia Panel: No results for input(s): VITAMINB12, FOLATE, FERRITIN, TIBC, IRON, RETICCTPCT in the last 72 hours. Urine analysis:    Component Value Date/Time   COLORURINE YELLOW 11/21/2019 2248   APPEARANCEUR HAZY (A) 11/21/2019 2248   LABSPEC 1.016 11/21/2019 2248   PHURINE 5.0 11/21/2019 2248   GLUCOSEU NEGATIVE 11/21/2019 2248   HGBUR MODERATE (A) 11/21/2019 2248   BILIRUBINUR NEGATIVE 11/21/2019 2248   KETONESUR NEGATIVE 11/21/2019 2248   PROTEINUR NEGATIVE 11/21/2019 2248   NITRITE NEGATIVE 11/21/2019 2248   LEUKOCYTESUR LARGE (A) 11/21/2019 2248     Wanda Ross M.D. [facility] 11/26/2019, 2:49 PM   Call night coverage person covering after 7pm

## 2019-11-26 NOTE — Progress Notes (Signed)
OT Cancellation Note  Patient Details Name: Wanda Ross MRN: [id number] DOB: 1951/03/05   Cancelled Treatment:    Reason Eval/Treat Not Completed: Patient at procedure or test/ unavailable - pt off unit at procedure, will follow and see as able.   Jolaine Artist, OT Acute Rehabilitation Services Pager [phone] Office [phone]   Delight Stare 11/26/2019, 9:47 AM

## 2019-11-26 NOTE — Transfer of Care (Signed)
Immediate Anesthesia Transfer of Care Note  Patient: Wanda Ross  Procedure(s) Performed: LEFT PARIETAL CRANIOTOMY FOR RESECTION OF TUMOR (Left Head) APPLICATION OF CRANIAL NAVIGATION (Left Head) OPERATIVE ULTRASOUND (N/A )  Patient Location: PACU  Anesthesia Type:General  Level of Consciousness: awake and drowsy  Airway & Oxygen Therapy: Patient Spontanous Breathing and Patient connected to face mask oxygen  Post-op Assessment: Report given to RN and Post -op Vital signs reviewed and stable  Post vital signs: Reviewed and stable  Last Vitals:  Vitals Value Taken Time  BP 141/82 11/26/19 1206  Temp    Pulse 58 11/26/19 1208  Resp 9 11/26/19 1208  SpO2 100 % 11/26/19 1208  Vitals shown include unvalidated device data.  Last Pain:  Vitals:   11/26/19 0300  TempSrc: Oral  PainSc:          Complications: No complications documented.

## 2019-11-27 ENCOUNTER — Encounter (HOSPITAL_COMMUNITY): Payer: Self-pay | Admitting: Neurosurgery

## 2019-11-27 ENCOUNTER — Inpatient Hospital Stay (HOSPITAL_COMMUNITY): Payer: Medicare PPO

## 2019-11-27 ENCOUNTER — Other Ambulatory Visit: Payer: Self-pay | Admitting: Radiation Therapy

## 2019-11-27 LAB — BASIC METABOLIC PANEL
Anion gap: 10 (ref 5–15)
BUN: 12 mg/dL (ref 8–23)
CO2: 22 mmol/L (ref 22–32)
Calcium: 7.6 mg/dL — ABNORMAL LOW (ref 8.9–10.3)
Chloride: 105 mmol/L (ref 98–111)
Creatinine, Ser: 0.74 mg/dL (ref 0.44–1.00)
GFR calc Af Amer: >60 mL/min (ref >60)
GFR calc non Af Amer: >60 mL/min (ref >60)
Glucose, Bld: 179 mg/dL — ABNORMAL HIGH (ref 70–99)
Potassium: 3.6 mmol/L (ref 3.5–5.1)
Sodium: 137 mmol/L (ref 135–145)

## 2019-11-27 LAB — CBC WITH DIFFERENTIAL/PLATELET
Abs Immature Granulocytes: 0.14 10*3/uL — ABNORMAL HIGH (ref 0.00–0.07)
Basophils Absolute: 0 10*3/uL (ref 0.0–0.1)
Basophils Relative: 0 %
Eosinophils Absolute: 0 10*3/uL (ref 0.0–0.5)
Eosinophils Relative: 0 %
HCT: 39.7 % (ref 36.0–46.0)
Hemoglobin: 13.6 g/dL (ref 12.0–15.0)
Immature Granulocytes: 1 %
Lymphocytes Relative: 2 %
Lymphs Abs: 0.4 10*3/uL — ABNORMAL LOW (ref 0.7–4.0)
MCH: 28.8 pg (ref 26.0–34.0)
MCHC: 34.3 g/dL (ref 30.0–36.0)
MCV: 84.1 fL (ref 80.0–100.0)
Monocytes Absolute: 1.1 10*3/uL — ABNORMAL HIGH (ref 0.1–1.0)
Monocytes Relative: 5 %
Neutro Abs: 19.1 10*3/uL — ABNORMAL HIGH (ref 1.7–7.7)
Neutrophils Relative %: 92 %
Platelets: 351 10*3/uL (ref 150–400)
RBC: 4.72 MIL/uL (ref 3.87–5.11)
RDW: 13.2 % (ref 11.5–15.5)
WBC: 20.7 10*3/uL — ABNORMAL HIGH (ref 4.0–10.5)
nRBC: 0 % (ref 0.0–0.2)

## 2019-11-27 MED ORDER — PANTOPRAZOLE SODIUM 40 MG PO TBEC
40.0000 mg | DELAYED_RELEASE_TABLET | Freq: Every day | ORAL | Status: DC
  Administered 2019-11-27 – 2019-12-02 (×6): 40 mg via ORAL
  Filled 2019-11-27 (×6): qty 1

## 2019-11-27 MED ORDER — DEXAMETHASONE 2 MG PO TABS: 1.0000 mg | ORAL_TABLET | Freq: Two times a day (BID) | ORAL | Status: DC

## 2019-11-27 MED ORDER — DEXAMETHASONE 4 MG PO TABS
4.0000 mg | ORAL_TABLET | Freq: Three times a day (TID) | ORAL | Status: AC
  Administered 2019-11-29 – 2019-12-01 (×5): 4 mg via ORAL
  Filled 2019-11-27 (×5): qty 1

## 2019-11-27 MED ORDER — DEXAMETHASONE 4 MG PO TABS
4.0000 mg | ORAL_TABLET | Freq: Four times a day (QID) | ORAL | Status: AC
  Administered 2019-11-27 – 2019-11-29 (×8): 4 mg via ORAL
  Filled 2019-11-27 (×8): qty 1

## 2019-11-27 MED ORDER — GADOBUTROL 1 MMOL/ML IV SOLN
7.0000 mL | Freq: Once | INTRAVENOUS | Status: AC | PRN
  Administered 2019-11-27: 7 mL via INTRAVENOUS

## 2019-11-27 MED ORDER — DEXAMETHASONE 2 MG PO TABS: 2.0000 mg | ORAL_TABLET | Freq: Two times a day (BID) | ORAL | Status: DC

## 2019-11-27 MED ORDER — DEXAMETHASONE 2 MG PO TABS
2.0000 mg | ORAL_TABLET | Freq: Three times a day (TID) | ORAL | Status: DC
  Administered 2019-12-01 – 2019-12-02 (×4): 2 mg via ORAL
  Filled 2019-11-27 (×4): qty 1

## 2019-11-27 MED ORDER — LEVETIRACETAM 500 MG PO TABS
500.0000 mg | ORAL_TABLET | Freq: Two times a day (BID) | ORAL | Status: DC
  Administered 2019-11-27 – 2019-12-02 (×11): 500 mg via ORAL
  Filled 2019-11-27 (×11): qty 1

## 2019-11-27 MED ORDER — DEXAMETHASONE 2 MG PO TABS: 1.0000 mg | ORAL_TABLET | Freq: Every day | ORAL | Status: DC

## 2019-11-27 NOTE — Progress Notes (Signed)
[facility]                                                                              Patient Demographics  Wanda Ross, is a 69 y.o. female, DOB - 1950/11/02, ATF:[id number]  Admit date - 11/21/2019   Admitting Physician Oswald Hillock, MD  Outpatient Primary MD for the patient is Fayrene Helper, MD  Outpatient specialists:   LOS - 5  days   Medical records reviewed and are as summarized below:    Chief Complaint  Patient presents with  . Altered Mental Status       Brief summary   Per HPI: [name] a6 y.o.female,with history of glaucoma, hyperlipidemia, hypertension was brought to the ED due to confusion and gait imbalance. As per patient daughter, patient was having intermittent confusion.Patient denies chest pain, shortness of breath, nausea, vomiting or diarrhea. Denies blurred vision. No speech disturbance. Patient complained of headache, poor appetite for past 2 weeks. 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 Denies abdominal pain or dysuria, Denies weight loss. No previous history of malignancy In the ED CT of the head was obtained which showed ill-defined 3.2 cm mass within the left parietal white matter, compatible malignancy. 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 Extensive vasogenic edema noted throughout the left cerebral hemisphere with significant sulcal effacement and minimal rightward midline shift. Neurosurgeon Dr. Marcello Moores was consulted by ED physician who recommended Decadron, MRI brain with and without contrast and transfer to [place]    Brain mass/glioma-new diagnosis -CT head showed brain mass with vasogenic edema throughout the left cerebral hemisphere with minimal rightward midline shift -Patient was started on Decadron, no acute neurological deficits were noted, neurosurgery was consulted. -CT chest abdomen and pelvis showed no acute findings within the chest abdomen and pelvis, no  evidence of primary malignancy.  Baseline EKG reviewed, no acute ST-T wave changes. -Patient underwent OR on 6/30, status post left craniotomy for resection of parietal and splenial mass -Postop day #1, has some cognitive slowing otherwise no new headaches or acute focal weakness.  Mild right-sided neglect noted. -Per pathology, Dr. 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  -Further recommendations by neurosurgery regarding disposition  Abnormal UA -Urine culture showed 60,000 colonies staph epidermidis -Hold off on antibiotics, no fevers, alert and oriented  Essential hypertension -BP currently controlled, resume oral antihypertensives when cleared by neurosurgery  Hyperlipidemia Continue pravastatin   Code Status: Full DVT Prophylaxis:  SCD's Family Communication: Discussed all imaging results, lab results, explained to the patient.  Discussed in detail with patient daughter on the phone on 6/30  Disposition Plan:     Status is: Inpatient  Remains inpatient appropriate because:Inpatient level of care appropriate due to severity of illness   Dispo: The patient is from: Home              Anticipated d/c is to: TBD              Anticipated d/c date is: > 3 days  Patient currently is not medically stable to d/c.      Time Spent in minutes 25 minutes  Procedures:  Procedure: 1.  Left craniotomy for resection parietal and splenial mass 2.  Use of computer-assisted neuro navigation   Consultants:   Neurosurgery  Antimicrobials:   Anti-infectives (From admission, onward)   Start     Dose/Rate Route Frequency Ordered Stop   11/26/19 1600  ceFAZolin (ANCEF) IVPB 1 g/50 mL premix        1 g 100 mL/hr over 30 Minutes Intravenous Every 8 hours 11/26/19 1404 11/27/19 0838   11/26/19 0600  ceFAZolin (ANCEF) IVPB 2g/100 mL premix        2 g 200 mL/hr over 30 Minutes Intravenous To Short Stay  11/25/19 1804 11/26/19 0810   11/24/19 1530  clindamycin (CLEOCIN) capsule 300 mg  Status:  Discontinued        300 mg Oral 2 times daily 11/24/19 1518 11/25/19 0929   11/23/19 1400  ampicillin-sulbactam (UNASYN) 1.5 g in sodium chloride 0.9 % 100 mL IVPB  Status:  Discontinued        1.5 g 200 mL/hr over 30 Minutes Intravenous Every 6 hours 11/23/19 1325 11/24/19 1518   11/22/19 0800  cefTRIAXone (ROCEPHIN) 1 g in sodium chloride 0.9 % 100 mL IVPB  Status:  Discontinued        1 g 200 mL/hr over 30 Minutes Intravenous Every 24 hours 11/22/19 0320 11/22/19 0444   11/22/19 0445  cefTRIAXone (ROCEPHIN) 1 g in sodium chloride 0.9 % 100 mL IVPB  Status:  Discontinued        1 g 200 mL/hr over 30 Minutes Intravenous Every 24 hours 11/22/19 0444 11/23/19 1325   11/21/19 2345  cephALEXin (KEFLEX) capsule 500 mg        500 mg Oral  Once 11/21/19 2330 11/22/19 0034         Medications  Scheduled Meds: . amLODipine  5 mg Oral Daily  . Chlorhexidine Gluconate Cloth  6 each Topical Daily  . cloNIDine  0.3 mg Oral QHS  . dexamethasone  4 mg Oral Q6H   Followed by  . [START ON 11/29/2019] dexamethasone  4 mg Oral Q8H   Followed by  . [START ON 12/01/2019] dexamethasone  2 mg Oral Q8H   Followed by  . [START ON 12/03/2019] dexamethasone  2 mg Oral Q12H   Followed by  . [START ON 12/05/2019] dexamethasone  1 mg Oral Q12H   Followed by  . [START ON 12/07/2019] dexamethasone  1 mg Oral Daily  . docusate sodium  100 mg Oral BID  . dorzolamide-timolol  1 drop Right Eye BID  . enoxaparin (LOVENOX) injection  40 mg Subcutaneous Q24H  . levETIRAcetam  500 mg Oral BID  . pantoprazole  40 mg Oral Daily  . pravastatin  10 mg Oral q1800  . senna  1 tablet Oral BID  . vitamin B-12  2,500 mcg Oral Daily   Continuous Infusions: . 0.9 % NaCl with KCl 20 mEq / L 100 mL/hr at 11/27/19 1200  . niCARDipine Stopped (11/27/19 0029)   PRN Meds:.acetaminophen **OR** acetaminophen, enalaprilat,  HYDROcodone-acetaminophen, labetalol, morphine injection, ondansetron **OR** ondansetron (ZOFRAN) IV, polyethylene glycol, promethazine, sodium phosphate      Subjective:   Wanda Ross was seen and examined today.  Right-sided neglect noted, no headaches, some cognitive slowing.  No acute issues overnight, no seizures.  No new focal weakness.  No chest pain, shortness of breath, nausea  vomiting or abdominal pain.  Objective:   Vitals:   11/27/19 0800 11/27/19 0900 11/27/19 1100 11/27/19 1200  BP: 133/61 128/65 120/62 133/71  Pulse: 74 86 76 77  Resp: 13 15 13 14   Temp: 98.2 F (36.8 C)   98.8 F (37.1 C)  TempSrc: Oral   Oral  SpO2: 97% 98% 99% 100%  Weight:      Height:        Intake/Output Summary (Last 24 hours) at 11/27/2019 1341 Last data filed at 11/27/2019 1200 Gross per 24 hour  Intake 3342.23 ml  Output 2550 ml  Net 792.23 ml     Wt Readings from Last 3 Encounters:  11/21/19 68.9 kg  10/22/19 68.9 kg  06/04/19 68.9 kg    Physical Exam  General: Alert and oriented x 3, some cognitive slowing and required repetitive verbal commands  Cardiovascular: S1 S2 clear, RRR. No pedal edema b/l  Respiratory: CTAB, no wheezing, rales or rhonchi  Gastrointestinal: Soft, nontender, nondistended, NBS  Ext: no pedal edema bilaterally  Neuro: Right-sided neglect, mild  Musculoskeletal: No cyanosis, clubbing  Skin: No rashes  Psych: Mild cognitive slowing,     Data Reviewed:  I have personally reviewed following labs and imaging studies  Micro Results Recent Results (from the past 240 hour(s))  Urine culture     Status: Abnormal   Collection Time: 11/21/19 10:48 PM   Specimen: Urine, Clean Catch  Result Value Ref Range Status   Specimen Description   Final    URINE, CLEAN CATCH Performed at [facility], [address] [place], [place] [address]    Special Requests   Final    NONE Performed at [facility], [address], [place], [place]  [address]    Culture (A)  Final    60,000 COLONIES/mL GROUP B STREP(S.AGALACTIAE)ISOLATED TESTING AGAINST S. AGALACTIAE NOT ROUTINELY PERFORMED DUE TO PREDICTABILITY OF AMP/PEN/VAN SUSCEPTIBILITY. 30,000 COLONIES/mL STAPHYLOCOCCUS EPIDERMIDIS    Report Status 11/24/2019 FINAL  Final   Organism ID, Bacteria STAPHYLOCOCCUS EPIDERMIDIS (A)  Final      Susceptibility   Staphylococcus epidermidis - MIC*    CIPROFLOXACIN <=0.5 SENSITIVE Sensitive     GENTAMICIN <=0.5 SENSITIVE Sensitive     NITROFURANTOIN <=16 SENSITIVE Sensitive     OXACILLIN >=4 RESISTANT Resistant     TETRACYCLINE <=1 SENSITIVE Sensitive     VANCOMYCIN 2 SENSITIVE Sensitive     TRIMETH/SULFA <=10 SENSITIVE Sensitive     CLINDAMYCIN <=0.25 SENSITIVE Sensitive     RIFAMPIN <=0.5 SENSITIVE Sensitive     Inducible Clindamycin NEGATIVE Sensitive     * 30,000 COLONIES/mL STAPHYLOCOCCUS EPIDERMIDIS  SARS Coronavirus 2 by RT PCR (hospital order, performed in [place] hospital lab) Nasopharyngeal Nasopharyngeal Swab     Status: None   Collection Time: 11/22/19 12:41 AM   Specimen: Nasopharyngeal Swab  Result Value Ref Range Status   SARS Coronavirus 2 NEGATIVE NEGATIVE Final    Comment: (NOTE) SARS-CoV-2 target nucleic acids are NOT DETECTED.  The SARS-CoV-2 RNA is generally detectable in upper and lower respiratory specimens during the acute phase of infection. The lowest concentration of SARS-CoV-2 viral copies this assay can detect is 250 copies / mL. A negative result does not preclude SARS-CoV-2 infection and should not be used as the sole basis for treatment or other patient management decisions.  A negative result may occur with improper specimen collection / handling, submission of specimen other than nasopharyngeal swab, presence of viral mutation(s) within the areas targeted by this  assay, and inadequate number of viral copies (<250 copies / mL). A negative result must be combined with clinical observations,  patient history, and epidemiological information.  Fact Sheet for Patients:   StrictlyIdeas.no  Fact Sheet for Healthcare Providers: BankingDealers.co.za  This test is not yet approved or  cleared by the [place] FDA and has been authorized for detection and/or diagnosis of SARS-CoV-2 by FDA under an Emergency Use Authorization (EUA).  This EUA will remain in effect (meaning this test can be used) for the duration of the COVID-19 declaration under Section 564(b)(1) of the Act, 21 U.S.C. section 360bbb-3(b)(1), unless the authorization is terminated or revoked sooner.  Performed at [facility], [address], [place], [place] [address]   Surgical PCR screen     Status: None   Collection Time: 11/26/19  5:45 AM   Specimen: Nasal Mucosa; Nasal Swab  Result Value Ref Range Status   MRSA, PCR NEGATIVE NEGATIVE Final   Staphylococcus aureus NEGATIVE NEGATIVE Final    Comment: (NOTE) The Xpert SA Assay (FDA approved for NASAL specimens in patients 59 years of age and older), is one component of a comprehensive surveillance program. It is not intended to diagnose infection nor to guide or monitor treatment. Performed at [facility], [place] [address] [place], [place] [address]     Radiology Reports DG Chest 2 View  Result Date: 11/22/2019 CLINICAL DATA:  Encephalopathy.  Newly discovered brain mass. EXAM: CHEST - 2 VIEW COMPARISON:  None. FINDINGS: The heart size and mediastinal contours are within normal limits. Both lungs are clear. The visualized skeletal structures are unremarkable. IMPRESSION: No active cardiopulmonary disease. Electronically Signed   By: Ulyses Jarred M.D.   On: 11/22/2019 04:03   CT Head Wo Contrast  Result Date: 11/22/2019 CLINICAL DATA:  Altered level of consciousness for 4 days, dizziness EXAM: CT HEAD WITHOUT CONTRAST TECHNIQUE: Contiguous axial images were obtained from the base of the skull through  the vertex without intravenous contrast. COMPARISON:  None. FINDINGS: Brain: There is significant vasogenic edema in the left frontal parietal region. 3.2 cm mass is seen within the left parietal white matter. MRI with and without contrast is recommended for further evaluation. Hypodensity extends along the posterior aspect of the corpus callosum, either extension of the mass or edema. I do not see any definite hemorrhage. There is significant effacement of the left lateral ventricle, with rightward midline shift measuring up to 4 mm at the septum pellucidum. There is diffuse sulcal effacement throughout the left cerebral hemisphere, greatest along the convexity. No acute extra-axial fluid collections. Vascular: No hyperdense vessel or unexpected calcification. Skull: Normal. Negative for fracture or focal lesion. Sinuses/Orbits: Small metallic densities are seen within the globes bilaterally, likely related to prior glaucoma surgery. Please correlate with surgical history. Otherwise the orbits are unremarkable. Other: None. IMPRESSION: 1. 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 MRI with and without contrast is recommended. 2. Extensive vasogenic edema throughout the left cerebral hemisphere, with significant sulcal effacement and minimal rightward midline shift. Significant effacement of the left lateral ventricle. These results were called by telephone at the time of interpretation on 11/22/2019 at 12:34 am to provider IVA KNAPP , who verbally acknowledged these results. 1. Extensive cerebral edema Electronically Signed   By: Randa Ngo M.D.   On: 11/22/2019 00:34   CT CHEST W CONTRAST  Result Date: 11/23/2019 CLINICAL DATA:  69 y.o. female was brought to the ED due to confusion and gait imbalance. As per patient daughter, patient was having intermittent confusion. Patient denies chest  pain, shortness of breath, nausea, vomiting or diarrhea. Mass seen on CT Head and Mri. EXAM: CT CHEST, ABDOMEN, AND PELVIS WITH CONTRAST TECHNIQUE: Multidetector CT imaging of the chest, abdomen and pelvis was performed following the standard protocol during bolus administration of intravenous contrast. CONTRAST:  180mL OMNIPAQUE IOHEXOL 300 MG/ML  SOLN COMPARISON:  Head CT and brain MRI from 11/22/2019 FINDINGS: CT CHEST FINDINGS Cardiovascular: Heart is normal in size and configuration. No pericardial effusion. No coronary artery calcifications. Great vessels are normal in caliber. No aortic atherosclerosis. Mediastinum/Nodes: No enlarged mediastinal, hilar, or axillary lymph nodes. Thyroid gland, trachea, and esophagus demonstrate no significant findings. Lungs/Pleura: Minor scarring at the base of the left upper lobe lingula. Minimal linear dependent lower lobe opacities consistent with atelectasis. Lungs otherwise clear with no masses or nodules. No pleural effusion or pneumothorax. Musculoskeletal: No fracture or acute finding. No osteoblastic or osteolytic lesions. CT ABDOMEN PELVIS FINDINGS Hepatobiliary: Liver normal in size and overall attenuation. Small low-density lesion between the anterior segment the right lobe and medial segment of the left, consistent with a cyst. Small amount of focal fat adjacent to the falciform ligament. No other abnormalities. Normal gallbladder. No bile duct dilation. Pancreas: Unremarkable. No pancreatic ductal dilatation or surrounding inflammatory changes. Spleen: Normal in size without focal abnormality. Adrenals/Urinary Tract: No adrenal masses. Kidneys normal in size, orientation and position with symmetric enhancement and excretion. Bilateral low-attenuation renal masses consistent with cysts, largest from the lower pole of the left kidney, 4.5 cm. No renal stones. No hydronephrosis. Normal ureters. Normal bladder. Stomach/Bowel: Small hiatal hernia. Stomach is  otherwise within normal limits. Appendix appears normal. No evidence of bowel wall thickening, distention, or inflammatory changes. Vascular/Lymphatic: No significant vascular abnormality. No enlarged lymph nodes. Reproductive: Uterus and bilateral adnexa are unremarkable. Other: No abdominal wall hernia or abnormality. No abdominopelvic ascites. Musculoskeletal: No fracture or acute finding. No osteoblastic or osteolytic lesions. IMPRESSION: 1. No acute findings within the chest, abdomen or pelvis. 2. No evidence of a primary malignancy within the chest, abdomen or pelvis. 3. Small low-density liver lesion consistent with a cyst. Bilateral renal cysts. Electronically Signed   By: Lajean Manes M.D.   On: 11/23/2019 14:38   MR BRAIN W WO CONTRAST  Result Date: 11/23/2019 CLINICAL DATA:  Intracranial mass EXAM: MRI HEAD WITHOUT AND WITH CONTRAST TECHNIQUE: Multiplanar, multiecho pulse sequences of the brain and surrounding structures were obtained without and with intravenous contrast. CONTRAST:  85mL GADAVIST GADOBUTROL 1 MMOL/ML IV SOLN COMPARISON:  None. FINDINGS: Brain: There is a peripherally and heterogeneously contrast-enhancing mass within the left parietal white matter extending along the corpus callosum splenium. Mass measures 4.8 x 3.3 x 3.0 cm. There is moderate surrounding hyperintense T2-weighted signal a crosses into the right hemisphere. Rightward midline shift measures 3 mm. There is petechial hemorrhage within the mass. Vascular: Normal flow voids. Skull and upper cervical spine: Normal marrow signal. Sinuses/Orbits: Negative. Other: None. IMPRESSION: 1. Heterogeneously contrast-enhancing mass within the left parietal white matter extending along the corpus callosum splenium, most consistent with high-grade glioma. 2. Moderate surrounding hyperintense T2-weighted signal, likely a combination of edema and nonenhancing tumor. 3. 3 mm of rightward midline shift. 4. Intratumoral petechial hemorrhage.  Electronically Signed   By: Ulyses Jarred M.D.   On: 11/23/2019 00:09   [place] Intraoperative  Result Date: 11/26/2019 CLINICAL  DATA:  Ultrasound was provided for use by the ordering physician, and a technical charge was applied by the performing facility.  No radiologist interpretation/professional services rendered.   CT ABDOMEN PELVIS W CONTRAST  Result Date: 11/23/2019 CLINICAL DATA:  69 y.o. female was brought to the ED due to confusion and gait imbalance. As per patient daughter, patient was having intermittent confusion. Patient denies chest pain, shortness of breath, nausea, vomiting or diarrhea. Mass seen on CT Head and Mri. EXAM: CT CHEST, ABDOMEN, AND PELVIS WITH CONTRAST TECHNIQUE: Multidetector CT imaging of the chest, abdomen and pelvis was performed following the standard protocol during bolus administration of intravenous contrast. CONTRAST:  130mL OMNIPAQUE IOHEXOL 300 MG/ML  SOLN COMPARISON:  Head CT and brain MRI from 11/22/2019 FINDINGS: CT CHEST FINDINGS Cardiovascular: Heart is normal in size and configuration. No pericardial effusion. No coronary artery calcifications. Great vessels are normal in caliber. No aortic atherosclerosis. Mediastinum/Nodes: No enlarged mediastinal, hilar, or axillary lymph nodes. Thyroid gland, trachea, and esophagus demonstrate no significant findings. Lungs/Pleura: Minor scarring at the base of the left upper lobe lingula. Minimal linear dependent lower lobe opacities consistent with atelectasis. Lungs otherwise clear with no masses or nodules. No pleural effusion or pneumothorax. Musculoskeletal: No fracture or acute finding. No osteoblastic or osteolytic lesions. CT ABDOMEN PELVIS FINDINGS Hepatobiliary: Liver normal in size and overall attenuation. Small low-density lesion between the anterior segment the right lobe and medial segment of the left, consistent with a cyst. Small amount of focal fat adjacent to the falciform ligament. No other abnormalities.  Normal gallbladder. No bile duct dilation. Pancreas: Unremarkable. No pancreatic ductal dilatation or surrounding inflammatory changes. Spleen: Normal in size without focal abnormality. Adrenals/Urinary Tract: No adrenal masses. Kidneys normal in size, orientation and position with symmetric enhancement and excretion. Bilateral low-attenuation renal masses consistent with cysts, largest from the lower pole of the left kidney, 4.5 cm. No renal stones. No hydronephrosis. Normal ureters. Normal bladder. Stomach/Bowel: Small hiatal hernia. Stomach is otherwise within normal limits. Appendix appears normal. No evidence of bowel wall thickening, distention, or inflammatory changes. Vascular/Lymphatic: No significant vascular abnormality. No enlarged lymph nodes. Reproductive: Uterus and bilateral adnexa are unremarkable. Other: No abdominal wall hernia or abnormality. No abdominopelvic ascites. Musculoskeletal: No fracture or acute finding. No osteoblastic or osteolytic lesions. IMPRESSION: 1. No acute findings within the chest, abdomen or pelvis. 2. No evidence of a primary malignancy within the chest, abdomen or pelvis. 3. Small low-density liver lesion consistent with a cyst. Bilateral renal cysts. Electronically Signed   By: Lajean Manes M.D.   On: 11/23/2019 14:38    Lab Data:  CBC: Recent Labs  Lab 11/21/19 2137 11/21/19 2137 11/22/19 0537 11/23/19 0837 11/24/19 0306 11/26/19 0445 11/27/19 0732  WBC 7.1   < > 6.3 15.6* 13.9* 11.0* 20.7*  NEUTROABS 5.1  --   --  14.1* 12.2* 9.7* 19.1*  HGB 14.8   < > 14.8 15.2* 13.6 14.4 13.6  HCT 43.5   < > 43.8 44.3 40.2 41.8 39.7  MCV 84.8   < > 84.6 84.1 84.3 82.6 84.1  PLT 356   < > 377 454* 382 390 351   < > = values in this interval not displayed.   Basic Metabolic Panel: Recent Labs  Lab 11/22/19 0537 11/22/19 0537 11/23/19 0837 11/24/19 0306 11/25/19 0312 11/25/19 0719 11/26/19 0445 11/27/19 0732  NA 140   < > 140 140 139  --  139 137  K  4.0   < >  3.9 3.7 4.1  --  4.1 3.6  CL 104   < > 103 106 106  --  106 105  CO2 25   < > 24 24 25   --  24 22  GLUCOSE 138*   < > 123* 126* 127*  --  138* 179*  BUN 10   < > 14 16 14   --  16 12  CREATININE 0.65   < > 0.79 0.83 0.70  --  0.76 0.74  CALCIUM 9.0   < > 8.7* 8.2* 8.6*  --  8.9 7.6*  MG 1.9  --  2.1  --   --  2.1 2.0  --    < > = values in this interval not displayed.   GFR: Estimated Creatinine Clearance: 65.1 mL/min (by C-G formula based on SCr of 0.74 mg/dL). Liver Function Tests: Recent Labs  Lab 11/21/19 2137 11/22/19 0537 11/23/19 0837 11/26/19 0445  AST 37 32 24 15  ALT 35 34 29 22  ALKPHOS 52 53 53 45  BILITOT 0.4 0.7 0.4 0.7  PROT 7.2 7.4 7.1 6.0*  ALBUMIN 4.3 4.4 3.8 3.3*   No results for input(s): LIPASE, AMYLASE in the last 168 hours. No results for input(s): AMMONIA in the last 168 hours. Coagulation Profile: Recent Labs  Lab 11/25/19 0719 11/26/19 0445  INR 1.1 1.2   Cardiac Enzymes: No results for input(s): CKTOTAL, CKMB, CKMBINDEX, TROPONINI in the last 168 hours. BNP (last 3 results) No results for input(s): PROBNP in the last 8760 hours. HbA1C: No results for input(s): HGBA1C in the last 72 hours. CBG: No results for input(s): GLUCAP in the last 168 hours. Lipid Profile: No results for input(s): CHOL, HDL, LDLCALC, TRIG, CHOLHDL, LDLDIRECT in the last 72 hours. Thyroid Function Tests: No results for input(s): TSH, T4TOTAL, FREET4, T3FREE, THYROIDAB in the last 72 hours. Anemia Panel: No results for input(s): VITAMINB12, FOLATE, FERRITIN, TIBC, IRON, RETICCTPCT in the last 72 hours. Urine analysis:    Component Value Date/Time   COLORURINE YELLOW 11/21/2019 2248   APPEARANCEUR HAZY (A) 11/21/2019 2248   LABSPEC 1.016 11/21/2019 2248   PHURINE 5.0 11/21/2019 2248   GLUCOSEU NEGATIVE 11/21/2019 2248   HGBUR MODERATE (A) 11/21/2019 2248   BILIRUBINUR NEGATIVE 11/21/2019 2248   KETONESUR NEGATIVE 11/21/2019 2248   PROTEINUR NEGATIVE  11/21/2019 2248   NITRITE NEGATIVE 11/21/2019 2248   LEUKOCYTESUR LARGE (A) 11/21/2019 2248     Wanda Ross M.D. [facility] 11/27/2019, 1:41 PM   Call night coverage person covering after 7pm

## 2019-11-27 NOTE — Progress Notes (Signed)
Neurosurgery  Postop MRI reviewed.  Good resection with no residual enhancing tumor seen.  Enhancement seen in the surgical cavity along the ventricle represents. prominent hypervascular choroid plexus.  Will plan to downgrade today.

## 2019-11-27 NOTE — Evaluation (Signed)
Physical Therapy Re-Evaluation Patient Details Name: Wanda Ross MRN: [id number] DOB: [birth year] Today's Date: 11/27/2019   History of Present Illness  69 yo female with onset of confusion and changes in balance was admitted, now dx with L parietal glioma, L cerebral hemisphere edema, procedure on 6/30 for neurosurgical resection.  PMHx:  glaucoma, HTN, HLD, Impaired glucose tolerance, multinodal goiter  Clinical Impression  Pt with mild increase in her deficits post-op crani.  Daughter was present and physically engaged in her mother's session providing a second hand for her first post-surgery walk down the hallway. Pt continues to demonstrate significant cog-linguistic deficits (see SLP notes) as well as R sided inattention, slower motor control/activiation and likely R sided visual deficit. She would benefit from intensive post acute rehab at discharge.  Goals updated. PT will continue to follow acutely for safe mobility progression.     Follow Up Recommendations CIR    Equipment Recommendations  None recommended by PT    Recommendations for Other Services Rehab consult     Precautions / Restrictions Precautions Precautions: Fall      Mobility  Bed Mobility Overal bed mobility: Needs Assistance Bed Mobility: Supine to Sit     Supine to sit: Supervision     General bed mobility comments: decreased awareness of where the edge of the bed is, so close supervision for safety.   Transfers Overall transfer level: Needs assistance Equipment used: 2 person hand held assist Transfers: Sit to/from Stand Sit to Stand: Min assist         General transfer comment: Min bil hand held assist for safety to come to standing and balance.   Ambulation/Gait Ambulation/Gait assistance: +2 physical assistance;Min assist Gait Distance (Feet): 75 Feet Assistive device: 2 person hand held assist Gait Pattern/deviations: Step-through pattern;Staggering left;Staggering right;Decreased  step length - right Gait velocity: decreased Gait velocity interpretation: 1.31 - 2.62 ft/sec, indicative of limited community ambulator General Gait Details: PT on one side (R) daughter on the other side assisting pt with gait into the hallway, gaze is to the left unless cued (multimodal) to attend to the right.  R leg is a bit slower to progress forward during reciprocal gait, but does not show signs of significant drag, knee instability or buckling during gait.           Balance Overall balance assessment: Needs assistance Sitting-balance support: Feet supported;Single extremity supported Sitting balance-Leahy Scale: Fair Sitting balance - Comments: close supervision EOB, not tested further, but pt seated on her hand.    Standing balance support: Bilateral upper extremity supported Standing balance-Leahy Scale: Poor Standing balance comment: needed external support today                             Pertinent Vitals/Pain Pain Assessment: No/denies pain    Home Living Family/patient expects to be discharged to:: Inpatient rehab Living Arrangements: Alone Available Help at Discharge: Family (daughter [name] present today) Type of Home: House Home Access: Stairs to enter Entrance Stairs-Rails: None Entrance Stairs-Number of Steps: 2 Home Layout: One level Home Equipment: Walker - 2 wheels Additional Comments: Pt is not using an AD at home currently    Prior Function Level of Independence: Independent         Comments: pt independent, walks multiple miles per day in her neighborhood.      Hand Dominance   Dominant Hand: Right    Extremity/Trunk Assessment   Upper Extremity Assessment Upper  Extremity Assessment: Defer to OT evaluation    Lower Extremity Assessment Lower Extremity Assessment: RLE deficits/detail RLE Deficits / Details: Desptie maintaining R LE strength, her leg is slower to coordinate movement to command and it takes manual and tactile  cues to get her to do muscle testing to command on her right side instead of repeatedly on the left when asked to do it on the right.  RLE Coordination: decreased fine motor;decreased gross motor (slow/delayed motor response)    Cervical / Trunk Assessment Cervical / Trunk Assessment: Normal  Communication   Communication: Receptive difficulties;Expressive difficulties (see SLP note)  Cognition Arousal/Alertness: Awake/alert Behavior During Therapy: Impulsive Overall Cognitive Status: Impaired/Different from baseline Area of Impairment: Following commands;Safety/judgement;Awareness;Problem solving                   Current Attention Level: Sustained   Following Commands: Follows one step commands inconsistently Safety/Judgement: Decreased awareness of safety;Decreased awareness of deficits Awareness: Intellectual Problem Solving: Difficulty sequencing;Requires verbal cues;Requires tactile cues General Comments: Pt with difficulty understanding instructions even simple ones, right sided inattention on top of likely R visual deficit, and decreased ability to process and then sequenc through even basic commands without multimodal cueing, rephrasing or repetition.       General Comments General comments (skin integrity, edema, etc.): Discussed precautions with daughter, [name], discussed sitting on her right side when visiting, things she may notice, safest if she is helping her mom on her own to guard her on her right side.          Assessment/Plan    PT Assessment Patient needs continued PT services  PT Problem List Decreased activity tolerance;Decreased balance;Decreased mobility;Decreased coordination;Decreased cognition;Decreased knowledge of use of DME;Decreased safety awareness;Cardiopulmonary status limiting activity       PT Treatment Interventions DME instruction;Stair training;Gait training;Functional mobility training;Therapeutic activities;Therapeutic exercise;Balance  training;Neuromuscular re-education;Patient/family education    PT Goals (Current goals can be found in the Care Plan section)  Acute Rehab PT Goals Patient Stated Goal: to get home PT Goal Formulation: With patient/family Time For Goal Achievement: 12/11/19 Potential to Achieve Goals: Good    Frequency Min 3X/week           AM-PAC PT "6 Clicks" Mobility  Outcome Measure Help needed turning from your back to your side while in a flat bed without using bedrails?: None Help needed moving from lying on your back to sitting on the side of a flat bed without using bedrails?: None Help needed moving to and from a bed to a chair (including a wheelchair)?: A Little Help needed standing up from a chair using your arms (e.g., wheelchair or bedside chair)?: A Little Help needed to walk in hospital room?: A Little Help needed climbing 3-5 steps with a railing? : A Little 6 Click Score: 20    End of Session Equipment Utilized During Treatment: Gait belt Activity Tolerance: Patient tolerated treatment well Patient left: in chair;with call bell/phone within reach;with chair alarm set;with family/visitor present Nurse Communication: Mobility status PT Visit Diagnosis: Unsteadiness on feet (R26.81);Ataxic gait (R26.0)    Time: 8786-7672 PT Time Calculation (min) (ACUTE ONLY): 42 min   Charges:          Verdene Lennert, PT, DPT  Acute Rehabilitation [phone] pager #[phone] office      PT Evaluation $PT Re-evaluation: 1 Re-eval PT Treatments $Gait Training: 8-22 mins $Self Care/Home Management: 8-22        11/27/2019, 6:09 PM

## 2019-11-27 NOTE — Progress Notes (Signed)
Subjective: Patient reports no headaches  Objective: Vital signs in last 24 hours: Temp:  [97.5 F (36.4 C)-98.7 F (37.1 C)] 98.2 F (36.8 C) (07/01 0800) Pulse Rate:  [56-104] 74 (07/01 0800) Resp:  [9-33] 13 (07/01 0800) BP: (99-157)/(47-113) 133/61 (07/01 0800) SpO2:  [94 %-100 %] 97 % (07/01 0800) Arterial Line BP: (69-176)/(49-89) 120/72 (07/01 0800)  Intake/Output from previous day: 06/30 0701 - 07/01 0700 In: 4888 [P.O.:840; I.V.:3548.2; IV Piggyback:399.8] Out: 4075 [YSAYT:0160; Blood:150] Intake/Output this shift: No intake/output data recorded.  Alert, oriented x 3 (said "Wednesday" instead of Thursday) Cognitive slowing similar to preoperatively Right visual field cut FC x 4, no drift. 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  No radiologist interpretation/professional services rendered.    Assessment/Plan: S/p resection of likely high grade glioma POD #1 - MRI this morning - decadron taper - Keppra x 7 days - speech/PT/OT - remove foley, A-line - likely downgrade today after MRI reviewed  Wanda Ross 11/27/2019, 8:26 AM

## 2019-11-27 NOTE — Evaluation (Addendum)
Speech Language Pathology Evaluation Patient Details Name: Wanda Ross MRN: [id number] DOB: [birth year] Today's Date: 11/27/2019 Time: 4235-3614 SLP Time Calculation (min) (ACUTE ONLY): 29 min  Problem List:  Patient Active Problem List   Diagnosis Date Noted  . Brain mass 11/22/2019  . 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 Abnormal electrocardiogram (ECG) (EKG) 12/31/2017  . White coat syndrome with diagnosis of hypertension 12/31/2017  . Glaucoma 12/26/2013  . Vitamin D deficiency 05/27/2009  . IMPAIRED GLUCOSE TOLERANCE 05/27/2009  . OSTEOPENIA 05/27/2009  . Multiple thyroid nodules 11/06/2007  . Hyperlipidemia 11/06/2007  . Malignant hypertension 11/06/2007   Past Medical History:  Past Medical History:  Diagnosis Date  . Glaucoma   . Hyperlipidemia 2010  . Hypertension 1990  . IGT (impaired glucose tolerance) 05/11/2010  . Multinodular goiter 2010   normal function   Past Surgical History:  Past Surgical History:  Procedure Laterality Date  . COLONOSCOPY N/A 06/16/2016   Procedure: COLONOSCOPY;  Surgeon: Danie Binder, MD;  Location: [facility];  Service: Endoscopy;  Laterality: N/A;  9:30 AM  . COLONOSCOPY N/A 10/22/2019   Procedure: COLONOSCOPY;  Surgeon: Daneil Dolin, MD;  Location: [facility];  Service: Endoscopy;  Laterality: N/A;  9:30  . EYE SURGERY  2017   3 on left eye 1 on right eye for glaucoma by Dr. Venetia Maxon   . EYE SURGERY  2018   Cataract Surgery by Dr. Arlina Robes    HPI:  Pt is a 69 yo female presenting with intermittent confusion and headaches. CT Head showed a 3.2 cm mass within the L parietal white matter and extensive vasogenic edema throughout the L hemisphere. Pt is s/p resection of suspected glioma 6/30. PMH includes HTN, HLD, glaucoma   Assessment / Plan / Recommendation Clinical Impression  Pt seen for cognitive-linguistic evaluation; swallow evaluation deferred as pt/RN report no overt difficulties at breakfast. Pt presents with moderate  cognitive-linguistic impairments, having difficulties with comprehension of instructions throughout testing. SLUMS was administered but with Mod additional cues to try to facilitate comprehension for improved accuracy, still scoring 8/30. Throughout testing she had mildly delayed processing but decreased recall, stating several times that she "didn't know" she needed to do something despite repeated explanations. Her expressive language is marked by word finding errors, perseverative words/thoughts, and occasional phonemic paraphasias. There was mild difficulty noted in trying to find items on the right side of the page. She will benefit from 24/7 supervision upon discharge with SLP f/u acutely and at next level of care.     SLP Assessment  SLP Recommendation/Assessment: Patient needs continued Speech Lanaguage Pathology Services SLP Visit Diagnosis: Cognitive communication deficit (R41.841)    Follow Up Recommendations  24 hour supervision/assistance;Other (comment) (SLP f/u at next level of care)    Frequency and Duration min 2x/week  2 weeks      SLP Evaluation Cognition  Overall Cognitive Status: Impaired/Different from baseline Arousal/Alertness: Awake/alert Orientation Level: Oriented to person;Oriented to place;Oriented to time Attention: Sustained Sustained Attention: Impaired Sustained Attention Impairment: Verbal basic Memory: Impaired Memory Impairment: Storage deficit;Retrieval deficit;Decreased recall of new information;Decreased short term memory Decreased Short Term Memory: Verbal basic Awareness: Impaired Awareness Impairment: Intellectual impairment;Emergent impairment;Anticipatory impairment Problem Solving: Impaired Problem Solving Impairment: Verbal basic;Functional complex Safety/Judgment: Impaired       Comprehension  Auditory Comprehension Overall Auditory Comprehension: Impaired Commands: Impaired One Step Basic Commands: 75-100% accurate Complex Commands:  25-49% accurate Conversation: Simple Interfering Components: Processing speed;Working Publishing rights manager  Mode of Expression: Verbal Verbal Expression Overall Verbal Expression: Impaired Initiation: No impairment Level of Generative/Spontaneous Verbalization: Sentence Naming: Impairment Divergent: 25-49% accurate Verbal Errors: Phonemic paraphasias;Not aware of errors (word finding errors; intermittently aware) Non-Verbal Means of Communication: Not applicable   Oral / Motor  Motor Speech Overall Motor Speech: Appears within functional limits for tasks assessed   GO                    Osie Bond., M.A. [place] [facility] Pager [phone] Office [phone]  11/27/2019, 8:55 AM

## 2019-11-28 ENCOUNTER — Other Ambulatory Visit: Payer: Self-pay | Admitting: Radiation Therapy

## 2019-11-28 DIAGNOSIS — I10 Essential (primary) hypertension: Secondary | ICD-10-CM

## 2019-11-28 DIAGNOSIS — D72829 Elevated white blood cell count, unspecified: Secondary | ICD-10-CM

## 2019-11-28 DIAGNOSIS — E785 Hyperlipidemia, unspecified: Secondary | ICD-10-CM

## 2019-11-28 DIAGNOSIS — R531 Weakness: Secondary | ICD-10-CM

## 2019-11-28 DIAGNOSIS — D72828 Other elevated white blood cell count: Secondary | ICD-10-CM

## 2019-11-28 DIAGNOSIS — T380X5A Adverse effect of glucocorticoids and synthetic analogues, initial encounter: Secondary | ICD-10-CM

## 2019-11-28 LAB — CBC
HCT: 38.9 % (ref 36.0–46.0)
Hemoglobin: 13.2 g/dL (ref 12.0–15.0)
MCH: 28.7 pg (ref 26.0–34.0)
MCHC: 33.9 g/dL (ref 30.0–36.0)
MCV: 84.6 fL (ref 80.0–100.0)
Platelets: 357 10*3/uL (ref 150–400)
RBC: 4.6 MIL/uL (ref 3.87–5.11)
RDW: 13.6 % (ref 11.5–15.5)
WBC: 17.4 10*3/uL — ABNORMAL HIGH (ref 4.0–10.5)
nRBC: 0 % (ref 0.0–0.2)

## 2019-11-28 LAB — BASIC METABOLIC PANEL
Anion gap: 9 (ref 5–15)
BUN: 14 mg/dL (ref 8–23)
CO2: 26 mmol/L (ref 22–32)
Calcium: 8.1 mg/dL — ABNORMAL LOW (ref 8.9–10.3)
Chloride: 105 mmol/L (ref 98–111)
Creatinine, Ser: 0.69 mg/dL (ref 0.44–1.00)
GFR calc Af Amer: >60 mL/min (ref >60)
GFR calc non Af Amer: >60 mL/min (ref >60)
Glucose, Bld: 137 mg/dL — ABNORMAL HIGH (ref 70–99)
Potassium: 3.9 mmol/L (ref 3.5–5.1)
Sodium: 140 mmol/L (ref 135–145)

## 2019-11-28 NOTE — Progress Notes (Signed)
Inpatient Rehabilitation-Admissions Coordinator   Followed up with pt at the bedside after PM&R consult. Please see Dr. Serita Grit formal consult note for details. We discussed current recommended rehab program, expectations, and ancipitated functional outcomes. Pt is interested in CIR program if she continues to qualify by next week. We discussed that we will reassess her come Monday to see if she still has CIR level needs by that time. With her permission I spoke to her daughter [name] via phone. She plans to speak with the rest of her family to see if they can provide the necessary supervision to support at short CIR stay. AC will go ahead and begin insurance authorization process for possible admit. Will follow up Monday.    Raechel Ache, OTR/L  Rehab Admissions Coordinator  [phone] 11/28/2019 4:16 PM

## 2019-11-28 NOTE — Evaluation (Signed)
Occupational Therapy Evaluation Patient Details Name: Wanda Ross MRN: [id number] DOB: Jun 12, 1950 Today's Date: 11/28/2019    History of Present Illness 69 yo female with onset of confusion and changes in balance was admitted, now dx with L parietal glioma, L cerebral hemisphere edema, procedure on 6/30 for neurosurgical resection.  PMHx:  glaucoma, HTN, HLD, Impaired glucose tolerance, multinodal goiter   Clinical Impression   This 69 yo female admitted with above presents to acute OT with PLOF of being totally independent with basic ADLs and some IADLs. She currently is Min A for basic ADLs due to vision/inattention on right affecting her safety and independence with mobility and basic ADLs. She is becoming aware of her right side vision issues and having intense rehab on CIR level will allow her to ingrain this even further so as to get back to her PLOF in her own home environment.    Follow Up Recommendations  CIR;Supervision/Assistance - 24 hour    Equipment Recommendations  Other (comment) (TBD next venue)       Precautions / Restrictions Precautions Precautions: Fall Restrictions Weight Bearing Restrictions: No      Mobility Bed Mobility               General bed mobility comments: pt up in recliner upon my arrival  Transfers Overall transfer level: Needs assistance Equipment used: 1 person hand held assist Transfers: Sit to/from Stand Sit to Stand: Min assist         General transfer comment: Min A to ambulate without AD ~5 feet recliner<>bed    Balance Overall balance assessment: Needs assistance Sitting-balance support: No upper extremity supported;Feet supported Sitting balance-Leahy Scale: Good     Standing balance support: Single extremity supported Standing balance-Leahy Scale: Poor                             ADL either performed or assessed with clinical judgement   ADL Overall ADL's : Needs  assistance/impaired Eating/Feeding: Set up;Supervision/ safety;Sitting   Grooming: Set up;Supervision/safety;Sitting   Upper Body Bathing: Supervision/ safety;Set up;Sitting   Lower Body Bathing: Minimal assistance;Sit to/from stand   Upper Body Dressing : Minimal assistance;Sitting   Lower Body Dressing: Minimal assistance;Sit to/from stand   Toilet Transfer: Minimal assistance;Ambulation Toilet Transfer Details (indicate cue type and reason): recliiner to bed ~ 5 feet away and then backed up to from bed to recliner with VCs to keep going Coy and Hygiene: Minimal assistance;Sit to/from stand               Vision Baseline Vision/History: Wears glasses Wears Glasses: Distance only Patient Visual Report: No change from baseline Vision Assessment?: Yes Eye Alignment: Within Functional Limits Ocular Range of Motion: Within Functional Limits Alignment/Gaze Preference: Within Defined Limits Tracking/Visual Pursuits: Able to track stimulus in all quads without difficulty Convergence: Within functional limits Visual Fields: Right homonymous hemianopsia (v. inattention) Additional Comments: At end of session pt could tell me she was not seeing things as readily on right hand side (after we discussed it eariler in session). Pt is able to stand at her bed (with bed at abdomen height) and find all of the 10 objects asked of her. She automatically moved her head and eyes around to find them without additional cue other than "find the .Marland Kitchen.."            Pertinent Vitals/Pain Pain Assessment: No/denies pain     Hand Dominance Right  Extremity/Trunk Assessment Upper Extremity Assessment Upper Extremity Assessment: Overall WFL for tasks assessed           Communication Communication Communication: Receptive difficulties;Expressive difficulties (see SLP note)   Cognition Arousal/Alertness: Awake/alert Behavior During Therapy: WFL for tasks  assessed/performed Overall Cognitive Status: Impaired/Different from baseline Area of Impairment: Following commands;Safety/judgement;Awareness;Problem solving;Attention                   Current Attention Level: Sustained   Following Commands: Follows one step commands inconsistently (does better if she repeats what you ask her to do first, then does it (she initated this compensatory technique on her own)) Safety/Judgement: Decreased awareness of safety;Decreased awareness of deficits (she was able to tell me at end of session that she was having more trouble seeing on right side (after we had discussed it eariler in session)) Awareness: Intellectual Problem Solving: Requires verbal cues;Slow processing;Difficulty sequencing General Comments: Pt did much better today (versus reading PT note yesterday) with following commands (she repeated what I asked of her on her own then did it).              Home Living Family/patient expects to be discharged to:: Inpatient rehab Living Arrangements: Alone Available Help at Discharge: Family Type of Home: House Home Access: Stairs to enter Technical brewer of Steps: 2 Entrance Stairs-Rails: None Home Layout: One level         Biochemist, clinical: Standard     Home Equipment: Environmental consultant - 2 wheels   Additional Comments: Pt is not using an AD at home currently  Lives With: Daughter    Prior Functioning/Environment Level of Independence: Independent        Comments: pt independent, walks multiple miles per day in her neighborhood.         OT Problem List: Impaired balance (sitting and/or standing);Impaired vision/perception;Decreased cognition;Decreased safety awareness      OT Treatment/Interventions: Self-care/ADL training;DME and/or AE instruction;Patient/family education;Balance training    OT Goals(Current goals can be found in the care plan section) Acute Rehab OT Goals Patient Stated Goal: to get better and go  home OT Goal Formulation: With patient Time For Goal Achievement: 12/12/19 Potential to Achieve Goals: Good  OT Frequency: Min 2X/week              AM-PAC OT "6 Clicks" Daily Activity     Outcome Measure Help from another person eating meals?: A Little Help from another person taking care of personal grooming?: A Little Help from another person toileting, which includes using toliet, bedpan, or urinal?: A Little Help from another person bathing (including washing, rinsing, drying)?: A Little Help from another person to put on and taking off regular upper body clothing?: A Little Help from another person to put on and taking off regular lower body clothing?: A Little 6 Click Score: 18   End of Session Equipment Utilized During Treatment: Gait belt  Activity Tolerance: Patient tolerated treatment well Patient left: in chair;with call bell/phone within reach;with chair alarm set  OT Visit Diagnosis: Unsteadiness on feet (R26.81);Other symptoms and signs involving cognitive function;Low vision, both eyes (H54.2)                Time: 1010-1036 OT Time Calculation (min): 26 min Charges:  OT General Charges $OT Visit: 1 Visit OT Evaluation $OT Eval Moderate Complexity: 1 Mod OT Treatments $Self Care/Home Management : 8-22 mins [place], OTR/L Acute [employer] Pager [phone] Office [phone]     Almon Register  11/28/2019, 11:23 AM

## 2019-11-28 NOTE — Progress Notes (Signed)
Physical Therapy Treatment Patient Details Name: Wanda Ross MRN: [id number] DOB: 09-08-1950 Today's Date: 11/28/2019    History of Present Illness 69 yo female with onset of confusion and changes in balance was admitted, now dx with L parietal glioma, L cerebral hemisphere edema, procedure on 6/30 for neurosurgical resection.  PMHx:  glaucoma, HTN, HLD, Impaired glucose tolerance, multinodal goiter    PT Comments    Pt doing better today particularly following commands and sequencing mobility.  She continues to have R sided inattention superimposed on R side visual deficits.  She needs cues for [facility] recall and did not do well with path finding mission during gait, needing significant help to both figure out how to find her room, remember her room number and look to the right to find the room number signs.  Min assist overall for balance during mobility/gait.  Son in room today and I educated him re: her deficits and today's observed improvements.  PT will continue to follow acutely for safe mobility progression.   Follow Up Recommendations  CIR     Equipment Recommendations  None recommended by PT    Recommendations for Other Services Rehab consult     Precautions / Restrictions Precautions Precautions: Fall Precaution Comments: R inattention    Mobility  Bed Mobility               General bed mobility comments: pt up in recliner upon my arrival  Transfers Overall transfer level: Needs assistance Equipment used: 1 person hand held assist Transfers: Sit to/from Stand Sit to Stand: Min guard         General transfer comment: Min guard assist to steady pt for balance  Ambulation/Gait Ambulation/Gait assistance: Min assist Gait Distance (Feet): 180 Feet Assistive device: 1 person hand held assist Gait Pattern/deviations: Step-through pattern;Staggering right;Staggering left Gait velocity: decreased Gait velocity interpretation: 1.31 - 2.62 ft/sec, indicative  of limited community ambulator General Gait Details: Pt with mildly staggering gait pattern.  Cues to look R when doing pathfinding mission in the hallway.  Significant difficulty problem solving through the path finding needing cues re: room number (I had to tell her multiple times her room number), figuring out where to go and looking R          Balance Overall balance assessment: Needs assistance Sitting-balance support: Feet supported;No upper extremity supported Sitting balance-Leahy Scale: Good     Standing balance support: Single extremity supported Standing balance-Leahy Scale: Poor Standing balance comment: needed external support today                            Cognition Arousal/Alertness: Awake/alert Behavior During Therapy: WFL for tasks assessed/performed Overall Cognitive Status: Impaired/Different from baseline Area of Impairment: Following commands;Safety/judgement;Awareness;Problem solving;Attention                   Current Attention Level: Sustained   Following Commands: Follows one step commands inconsistently Safety/Judgement: Decreased awareness of safety;Decreased awareness of deficits Awareness: Intellectual Problem Solving: Requires verbal cues;Slow processing;Difficulty sequencing General Comments: Pt did better today with basic command following, attempting to report and reason re: her right visual field cut and R inattention.               Pertinent Vitals/Pain Pain Assessment: No/denies pain       Prior Function            PT Goals (current goals can now be found in the  care plan section) Acute Rehab PT Goals Patient Stated Goal: to get better and go home Progress towards PT goals: Progressing toward goals    Frequency    Min 3X/week      PT Plan Current plan remains appropriate       AM-PAC PT "6 Clicks" Mobility   Outcome Measure  Help needed turning from your back to your side while in a flat bed  without using bedrails?: None Help needed moving from lying on your back to sitting on the side of a flat bed without using bedrails?: None Help needed moving to and from a bed to a chair (including a wheelchair)?: A Little Help needed standing up from a chair using your arms (e.g., wheelchair or bedside chair)?: A Little Help needed to walk in hospital room?: A Little Help needed climbing 3-5 steps with a railing? : A Little 6 Click Score: 20    End of Session   Activity Tolerance: Patient tolerated treatment well Patient left: in chair;with call bell/phone within reach;with family/visitor present Nurse Communication: Mobility status PT Visit Diagnosis: Unsteadiness on feet (R26.81);Ataxic gait (R26.0)     Time: 2836-6294 PT Time Calculation (min) (ACUTE ONLY): 29 min  Charges:  $Gait Training: 23-37 mins                    Verdene Lennert, PT, DPT  Acute Rehabilitation [phone] pager [phone]) [phone] office

## 2019-11-28 NOTE — Progress Notes (Signed)
Neurosurgery  Patient doing well postop day #2.  There is cognitive slowing stable from preoperatively.  There is a right visual field deficit.  Her dressing is clean dry and intact.  There is no drift.  She is oriented x3.  Plan is for likely rehabilitation admission.

## 2019-11-28 NOTE — Progress Notes (Addendum)
[facility]                                                                              Patient Demographics  Wanda Ross, is a 69 y.o. female, DOB - [birth year], OIT:[id number]  Admit date - 11/21/2019   Admitting Physician Oswald Hillock, MD  Outpatient Primary MD for the patient is Fayrene Helper, MD  Outpatient specialists:   LOS - 6  days   Medical records reviewed and are as summarized below:    Chief Complaint  Patient presents with  . Altered Mental Status       Brief summary   Per HPI: [name] a56 y.o.female,with history of glaucoma, hyperlipidemia, hypertension was brought to the ED due to confusion and gait imbalance. As per patient daughter, patient was having intermittent confusion.Patient denies chest pain, shortness of breath, nausea, vomiting or diarrhea. Denies blurred vision. No speech disturbance. Patient complained of headache, poor appetite for past 2 weeks. 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 Denies abdominal pain or dysuria, Denies weight loss. No previous history of malignancy In the ED CT of the head was obtained which showed ill-defined 3.2 cm mass within the left parietal white matter, compatible malignancy. 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 Extensive vasogenic edema noted throughout the left cerebral hemisphere with significant sulcal effacement and minimal rightward midline shift. Neurosurgeon Dr. Marcello Moores was consulted by ED physician who recommended Decadron, MRI brain with and without contrast and transfer to [facility]    Brain mass/glioma-new diagnosis -CT head showed brain mass with vasogenic edema throughout the left cerebral hemisphere with minimal rightward midline shift -Patient was started on Decadron, no acute neurological deficits were noted, neurosurgery was consulted. -CT chest abdomen and pelvis showed no acute findings within the chest abdomen and pelvis, no  evidence of primary malignancy.  Baseline EKG reviewed, no acute ST-T wave changes. -Patient underwent OR on 6/30, status post left craniotomy for resection of parietal and splenial mass -Postop day #2, still has some cognitive slowing and right sided visual field deficit. -Per pathology, Dr. 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  -d/w Neurosurgery, Dr Marcello Moores, have discussed with Dr. Mickeal Skinner and oincology, plan for possible XRT in 2-3 weeks and chemo, will be determined by the biopsy results. Okay from [place] to start rehab.   Abnormal UA -Urine culture showed 60,000 colonies staph epidermidis -Hold off on antibiotics, no fevers, alert and oriented  Essential hypertension -BP stable, resume oral antihypertensives when cleared by neurosurgery  Hyperlipidemia Continue pravastatin   Code Status: Full DVT Prophylaxis:  SCD's Family Communication: Discussed all imaging results, lab results, explained to the patient.  Left detailed message on daughter's phone for the update.   Disposition Plan:     Status is: Inpatient  Remains inpatient appropriate because:Inpatient level of care appropriate due to severity of illness   Dispo: The patient is from: Home              Anticipated d/c is to: TBD  Anticipated d/c date is: > 3 days              Patient currently is medically stable to d/c. CIR pending insurance approval       Time Spent in minutes 25 minutes  Procedures:  Procedure: 1.  Left craniotomy for resection parietal and splenial mass 2.  Use of computer-assisted neuro navigation   Consultants:   Neurosurgery  Antimicrobials:   Anti-infectives (From admission, onward)   Start     Dose/Rate Route Frequency Ordered Stop   11/26/19 1600  ceFAZolin (ANCEF) IVPB 1 g/50 mL premix        1 g 100 mL/hr over 30 Minutes Intravenous Every 8 hours 11/26/19 1404 11/27/19 0838    11/26/19 0600  ceFAZolin (ANCEF) IVPB 2g/100 mL premix        2 g 200 mL/hr over 30 Minutes Intravenous To Short Stay 11/25/19 1804 11/26/19 0810   11/24/19 1530  clindamycin (CLEOCIN) capsule 300 mg  Status:  Discontinued        300 mg Oral 2 times daily 11/24/19 1518 11/25/19 0929   11/23/19 1400  ampicillin-sulbactam (UNASYN) 1.5 g in sodium chloride 0.9 % 100 mL IVPB  Status:  Discontinued        1.5 g 200 mL/hr over 30 Minutes Intravenous Every 6 hours 11/23/19 1325 11/24/19 1518   11/22/19 0800  cefTRIAXone (ROCEPHIN) 1 g in sodium chloride 0.9 % 100 mL IVPB  Status:  Discontinued        1 g 200 mL/hr over 30 Minutes Intravenous Every 24 hours 11/22/19 0320 11/22/19 0444   11/22/19 0445  cefTRIAXone (ROCEPHIN) 1 g in sodium chloride 0.9 % 100 mL IVPB  Status:  Discontinued        1 g 200 mL/hr over 30 Minutes Intravenous Every 24 hours 11/22/19 0444 11/23/19 1325   11/21/19 2345  cephALEXin (KEFLEX) capsule 500 mg        500 mg Oral  Once 11/21/19 2330 11/22/19 0034         Medications  Scheduled Meds: . amLODipine  5 mg Oral Daily  . Chlorhexidine Gluconate Cloth  6 each Topical Daily  . cloNIDine  0.3 mg Oral QHS  . dexamethasone  4 mg Oral Q6H   Followed by  . [START ON 11/29/2019] dexamethasone  4 mg Oral Q8H   Followed by  . [START ON 12/01/2019] dexamethasone  2 mg Oral Q8H   Followed by  . [START ON 12/03/2019] dexamethasone  2 mg Oral Q12H   Followed by  . [START ON 12/05/2019] dexamethasone  1 mg Oral Q12H   Followed by  . [START ON 12/07/2019] dexamethasone  1 mg Oral Daily  . docusate sodium  100 mg Oral BID  . dorzolamide-timolol  1 drop Right Eye BID  . enoxaparin (LOVENOX) injection  40 mg Subcutaneous Q24H  . levETIRAcetam  500 mg Oral BID  . pantoprazole  40 mg Oral Daily  . pravastatin  10 mg Oral q1800  . senna  1 tablet Oral BID  . vitamin B-12  2,500 mcg Oral Daily   Continuous Infusions:  PRN Meds:.acetaminophen **OR** acetaminophen, enalaprilat,  HYDROcodone-acetaminophen, labetalol, morphine injection, ondansetron **OR** ondansetron (ZOFRAN) IV, polyethylene glycol, promethazine, sodium phosphate      Subjective:   Wanda Ross was seen and examined today.  No headaches. Still some cognitive slowing. Right sided visual deficit.   No chest pain, shortness of breath, nausea vomiting or abdominal pain. No focal weakness.  Objective:   Vitals:   11/28/19 0900 11/28/19 1000 11/28/19 1100 11/28/19 1200  BP:      Pulse: 76 69 74 71  Resp: (!) 23 14 13 15   Temp:    97.8 F (36.6 C)  TempSrc:    Oral  SpO2: 97% 100% 97% 98%  Weight:      Height:        Intake/Output Summary (Last 24 hours) at 11/28/2019 1548 Last data filed at 11/28/2019 1000 Gross per 24 hour  Intake 123.62 ml  Output --  Net 123.62 ml     Wt Readings from Last 3 Encounters:  11/21/19 68.9 kg  10/22/19 68.9 kg  06/04/19 68.9 kg     Physical Exam  General: Alert and oriented x 3, mild cognitive slowing   Cardiovascular: S1 S2 clear, RRR. No pedal edema b/l  Respiratory: CTAB, no wheezing, rales or rhonchi  Gastrointestinal: Soft, nontender, nondistended, NBS  Ext: no pedal edema bilaterally  Neuro: no new deficits  Musculoskeletal: No cyanosis, clubbing  Skin: No rashes  Psych: Normal affect and demeanor, alert and oriented x3    Data Reviewed:  I have personally reviewed following labs and imaging studies  Micro Results Recent Results (from the past 240 hour(s))  Urine culture     Status: Abnormal   Collection Time: 11/21/19 10:48 PM   Specimen: Urine, Clean Catch  Result Value Ref Range Status   Specimen Description   Final    URINE, CLEAN CATCH Performed at [facility], [address] [place], [place] [address]    Special Requests   Final    NONE Performed at [facility], [address] [place], [place] [address]    Culture (A)  Final    60,000 COLONIES/mL GROUP B STREP(S.AGALACTIAE)ISOLATED TESTING AGAINST S.  AGALACTIAE NOT ROUTINELY PERFORMED DUE TO PREDICTABILITY OF AMP/PEN/VAN SUSCEPTIBILITY. 30,000 COLONIES/mL STAPHYLOCOCCUS EPIDERMIDIS    Report Status 11/24/2019 FINAL  Final   Organism ID, Bacteria STAPHYLOCOCCUS EPIDERMIDIS (A)  Final      Susceptibility   Staphylococcus epidermidis - MIC*    CIPROFLOXACIN <=0.5 SENSITIVE Sensitive     GENTAMICIN <=0.5 SENSITIVE Sensitive     NITROFURANTOIN <=16 SENSITIVE Sensitive     OXACILLIN >=4 RESISTANT Resistant     TETRACYCLINE <=1 SENSITIVE Sensitive     VANCOMYCIN 2 SENSITIVE Sensitive     TRIMETH/SULFA <=10 SENSITIVE Sensitive     CLINDAMYCIN <=0.25 SENSITIVE Sensitive     RIFAMPIN <=0.5 SENSITIVE Sensitive     Inducible Clindamycin NEGATIVE Sensitive     * 30,000 COLONIES/mL STAPHYLOCOCCUS EPIDERMIDIS  SARS Coronavirus 2 by RT PCR (hospital order, performed in [place] hospital lab) Nasopharyngeal Nasopharyngeal Swab     Status: None   Collection Time: 11/22/19 12:41 AM   Specimen: Nasopharyngeal Swab  Result Value Ref Range Status   SARS Coronavirus 2 NEGATIVE NEGATIVE Final    Comment: (NOTE) SARS-CoV-2 target nucleic acids are NOT DETECTED.  The SARS-CoV-2 RNA is generally detectable in upper and lower respiratory specimens during the acute phase of infection. The lowest concentration of SARS-CoV-2 viral copies this assay can detect is 250 copies / mL. A negative result does not preclude SARS-CoV-2 infection and should not be used as the sole basis for treatment or other patient management decisions.  A negative result may occur with improper specimen collection / handling, submission of specimen other than nasopharyngeal swab, presence of viral mutation(s) within the areas targeted by this assay, and inadequate number of viral copies (<  250 copies / mL). A negative result must be combined with clinical observations, patient history, and epidemiological information.  Fact Sheet for Patients:    StrictlyIdeas.no  Fact Sheet for Healthcare Providers: BankingDealers.co.za  This test is not yet approved or  cleared by the [place] FDA and has been authorized for detection and/or diagnosis of SARS-CoV-2 by FDA under an Emergency Use Authorization (EUA).  This EUA will remain in effect (meaning this test can be used) for the duration of the COVID-19 declaration under Section 564(b)(1) of the Act, 21 U.S.C. section 360bbb-3(b)(1), unless the authorization is terminated or revoked sooner.  Performed at [facility], [address], [place], [place] [address]   Surgical PCR screen     Status: None   Collection Time: 11/26/19  5:45 AM   Specimen: Nasal Mucosa; Nasal Swab  Result Value Ref Range Status   MRSA, PCR NEGATIVE NEGATIVE Final   Staphylococcus aureus NEGATIVE NEGATIVE Final    Comment: (NOTE) The Xpert SA Assay (FDA approved for NASAL specimens in patients 35 years of age and older), is one component of a comprehensive surveillance program. It is not intended to diagnose infection nor to guide or monitor treatment. Performed at [facility], [place] [address] [place], [place] [address]     Radiology Reports DG Chest 2 View  Result Date: 11/22/2019 CLINICAL DATA:  Encephalopathy.  Newly discovered brain mass. EXAM: CHEST - 2 VIEW COMPARISON:  None. FINDINGS: The heart size and mediastinal contours are within normal limits. Both lungs are clear. The visualized skeletal structures are unremarkable. IMPRESSION: No active cardiopulmonary disease. Electronically Signed   By: Ulyses Jarred M.D.   On: 11/22/2019 04:03   CT Head Wo Contrast  Result Date: 11/22/2019 CLINICAL DATA:  Altered level of consciousness for 4 days, dizziness EXAM: CT HEAD WITHOUT CONTRAST TECHNIQUE: Contiguous axial images were obtained from the base of the skull through the vertex without intravenous contrast. COMPARISON:  None. FINDINGS: Brain:  There is significant vasogenic edema in the left frontal parietal region. 3.2 cm mass is seen within the left parietal white matter. MRI with and without contrast is recommended for further evaluation. Hypodensity extends along the posterior aspect of the corpus callosum, either extension of the mass or edema. I do not see any definite hemorrhage. There is significant effacement of the left lateral ventricle, with rightward midline shift measuring up to 4 mm at the septum pellucidum. There is diffuse sulcal effacement throughout the left cerebral hemisphere, greatest along the convexity. No acute extra-axial fluid collections. Vascular: No hyperdense vessel or unexpected calcification. Skull: Normal. Negative for fracture or focal lesion. Sinuses/Orbits: Small metallic densities are seen within the globes bilaterally, likely related to prior glaucoma surgery. Please correlate with surgical history. Otherwise the orbits are unremarkable. Other: None. IMPRESSION: 1. 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 MRI with and without contrast is recommended. 2. Extensive vasogenic edema throughout the left cerebral hemisphere, with significant sulcal effacement and minimal rightward midline shift. Significant effacement of the left lateral ventricle. These results were called by telephone at the time of interpretation on 11/22/2019 at 12:34 am to provider IVA KNAPP , who verbally acknowledged these results. 1. Extensive cerebral edema Electronically Signed   By: Randa Ngo M.D.   On: 11/22/2019 00:34   CT CHEST W CONTRAST  Result Date: 11/23/2019 CLINICAL DATA:  69 y.o. female was brought to  the ED due to confusion and gait imbalance. As per patient daughter, patient was having intermittent confusion. Patient denies chest pain, shortness of breath, nausea, vomiting or diarrhea. Mass seen on CT Head  and Mri. EXAM: CT CHEST, ABDOMEN, AND PELVIS WITH CONTRAST TECHNIQUE: Multidetector CT imaging of the chest, abdomen and pelvis was performed following the standard protocol during bolus administration of intravenous contrast. CONTRAST:  133mL OMNIPAQUE IOHEXOL 300 MG/ML  SOLN COMPARISON:  Head CT and brain MRI from 11/22/2019 FINDINGS: CT CHEST FINDINGS Cardiovascular: Heart is normal in size and configuration. No pericardial effusion. No coronary artery calcifications. Great vessels are normal in caliber. No aortic atherosclerosis. Mediastinum/Nodes: No enlarged mediastinal, hilar, or axillary lymph nodes. Thyroid gland, trachea, and esophagus demonstrate no significant findings. Lungs/Pleura: Minor scarring at the base of the left upper lobe lingula. Minimal linear dependent lower lobe opacities consistent with atelectasis. Lungs otherwise clear with no masses or nodules. No pleural effusion or pneumothorax. Musculoskeletal: No fracture or acute finding. No osteoblastic or osteolytic lesions. CT ABDOMEN PELVIS FINDINGS Hepatobiliary: Liver normal in size and overall attenuation. Small low-density lesion between the anterior segment the right lobe and medial segment of the left, consistent with a cyst. Small amount of focal fat adjacent to the falciform ligament. No other abnormalities. Normal gallbladder. No bile duct dilation. Pancreas: Unremarkable. No pancreatic ductal dilatation or surrounding inflammatory changes. Spleen: Normal in size without focal abnormality. Adrenals/Urinary Tract: No adrenal masses. Kidneys normal in size, orientation and position with symmetric enhancement and excretion. Bilateral low-attenuation renal masses consistent with cysts, largest from the lower pole of the left kidney, 4.5 cm. No renal stones. No hydronephrosis. Normal ureters. Normal bladder. Stomach/Bowel: Small hiatal hernia. Stomach is otherwise within normal limits. Appendix appears normal. No evidence of bowel wall  thickening, distention, or inflammatory changes. Vascular/Lymphatic: No significant vascular abnormality. No enlarged lymph nodes. Reproductive: Uterus and bilateral adnexa are unremarkable. Other: No abdominal wall hernia or abnormality. No abdominopelvic ascites. Musculoskeletal: No fracture or acute finding. No osteoblastic or osteolytic lesions. IMPRESSION: 1. No acute findings within the chest, abdomen or pelvis. 2. No evidence of a primary malignancy within the chest, abdomen or pelvis. 3. Small low-density liver lesion consistent with a cyst. Bilateral renal cysts. Electronically Signed   By: Lajean Manes M.D.   On: 11/23/2019 14:38   MR BRAIN W WO CONTRAST  Result Date: 11/27/2019 CLINICAL DATA:  Postop day 1 following resection of a left parietal tumor (likely high-grade glioma). EXAM: MRI HEAD WITHOUT AND WITH CONTRAST TECHNIQUE: Multiplanar, multiecho pulse sequences of the brain and surrounding structures were obtained without and with intravenous contrast. CONTRAST:  10mL GADAVIST GADOBUTROL 1 MMOL/ML IV SOLN COMPARISON:  11/22/2019 FINDINGS: Brain: Sequelae of interval left parietal craniotomy and tumor resection are identified. There is a resection cavity in the left parietal lobe and splenium of the corpus callosum with a small amount of blood products. A small amount of restricted diffusion is noted along the margins of the resection cavity without a sizable acute infarct. There is a small amount of enhancement along the margins of the resection cavity which is largely curvilinear and likely postoperative. There is more nodular enhancement at the anterior aspect of the resection site at the level of the atrium of the left lateral ventricle which is felt to at least partially reflect choroid plexus. There is moderate nonenhancing T2 hyperintensity/edema in the white matter of the left parietal, occipital, and posterior temporal lobes which has decreased from the prior study. There  is persistent  nonenhancing T2 hyperintensity which crosses the splenium of the corpus callosum into the right periatrial white matter. There is trace residual rightward midline shift, decreased from prior. There is small volume pneumocephalus, and there is a small extra-axial fluid collection subjacent to the craniotomy. Mild dural thickening and enhancement over the posterior left cerebral convexity is likely postoperative. Vascular: Major intracranial vascular flow voids are preserved. Skull and upper cervical spine: Left parietal craniotomy with small volume fluid and gas in the scalp soft tissues. Sinuses/Orbits: Left cataract extraction. Paranasal sinuses and mastoid air cells are clear. Other: None. IMPRESSION: 1. Postoperative changes from interval left parietal and splenial tumor resection. Mild enhancement along the margins of the resection cavity as above. This will serve as a baseline for follow-up examinations. 2. Decreased surrounding T2 hyperintensity compatible with decreased edema and likely underlying nonenhancing tumor. Electronically Signed   By: Logan Bores M.D.   On: 11/27/2019 13:58   MR BRAIN W WO CONTRAST  Result Date: 11/23/2019 CLINICAL DATA:  Intracranial mass EXAM: MRI HEAD WITHOUT AND WITH CONTRAST TECHNIQUE: Multiplanar, multiecho pulse sequences of the brain and surrounding structures were obtained without and with intravenous contrast. CONTRAST:  26mL GADAVIST GADOBUTROL 1 MMOL/ML IV SOLN COMPARISON:  None. FINDINGS: Brain: There is a peripherally and heterogeneously contrast-enhancing mass within the left parietal white matter extending along the corpus callosum splenium. Mass measures 4.8 x 3.3 x 3.0 cm. There is moderate surrounding hyperintense T2-weighted signal a crosses into the right hemisphere. Rightward midline shift measures 3 mm. There is petechial hemorrhage within the mass. Vascular: Normal flow voids. Skull and upper cervical spine: Normal marrow signal. Sinuses/Orbits: Negative.  Other: None. IMPRESSION: 1. Heterogeneously contrast-enhancing mass within the left parietal white matter extending along the corpus callosum splenium, most consistent with high-grade glioma. 2. Moderate surrounding hyperintense T2-weighted signal, likely a combination of edema and nonenhancing tumor. 3. 3 mm of rightward midline shift. 4. Intratumoral petechial hemorrhage. Electronically Signed   By: Ulyses Jarred M.D.   On: 11/23/2019 00:09   [place] Intraoperative  Result Date: 11/26/2019 CLINICAL DATA:  Ultrasound was provided for use by the ordering physician, and a technical charge was applied by the performing facility.  No radiologist interpretation/professional services rendered.   CT ABDOMEN PELVIS W CONTRAST  Result Date: 11/23/2019 CLINICAL DATA:  69 y.o. female was brought to the ED due to confusion and gait imbalance. As per patient daughter, patient was having intermittent confusion. Patient denies chest pain, shortness of breath, nausea, vomiting or diarrhea. Mass seen on CT Head and Mri. EXAM: CT CHEST, ABDOMEN, AND PELVIS WITH CONTRAST TECHNIQUE: Multidetector CT imaging of the chest, abdomen and pelvis was performed following the standard protocol during bolus administration of intravenous contrast. CONTRAST:  185mL OMNIPAQUE IOHEXOL 300 MG/ML  SOLN COMPARISON:  Head CT and brain MRI from 11/22/2019 FINDINGS: CT CHEST FINDINGS Cardiovascular: Heart is normal in size and configuration. No pericardial effusion. No coronary artery calcifications. Great vessels are normal in caliber. No aortic atherosclerosis. Mediastinum/Nodes: No enlarged mediastinal, hilar, or axillary lymph nodes. Thyroid gland, trachea, and esophagus demonstrate no significant findings. Lungs/Pleura: Minor scarring at the base of the left upper lobe lingula. Minimal linear dependent lower lobe opacities consistent with atelectasis. Lungs otherwise clear with no masses or nodules. No pleural effusion or pneumothorax.  Musculoskeletal: No fracture or acute finding. No osteoblastic or osteolytic lesions. CT ABDOMEN PELVIS FINDINGS Hepatobiliary: Liver normal in size and overall attenuation. Small low-density lesion between the anterior segment the  right lobe and medial segment of the left, consistent with a cyst. Small amount of focal fat adjacent to the falciform ligament. No other abnormalities. Normal gallbladder. No bile duct dilation. Pancreas: Unremarkable. No pancreatic ductal dilatation or surrounding inflammatory changes. Spleen: Normal in size without focal abnormality. Adrenals/Urinary Tract: No adrenal masses. Kidneys normal in size, orientation and position with symmetric enhancement and excretion. Bilateral low-attenuation renal masses consistent with cysts, largest from the lower pole of the left kidney, 4.5 cm. No renal stones. No hydronephrosis. Normal ureters. Normal bladder. Stomach/Bowel: Small hiatal hernia. Stomach is otherwise within normal limits. Appendix appears normal. No evidence of bowel wall thickening, distention, or inflammatory changes. Vascular/Lymphatic: No significant vascular abnormality. No enlarged lymph nodes. Reproductive: Uterus and bilateral adnexa are unremarkable. Other: No abdominal wall hernia or abnormality. No abdominopelvic ascites. Musculoskeletal: No fracture or acute finding. No osteoblastic or osteolytic lesions. IMPRESSION: 1. No acute findings within the chest, abdomen or pelvis. 2. No evidence of a primary malignancy within the chest, abdomen or pelvis. 3. Small low-density liver lesion consistent with a cyst. Bilateral renal cysts. Electronically Signed   By: Lajean Manes M.D.   On: 11/23/2019 14:38    Lab Data:  CBC: Recent Labs  Lab 11/21/19 2137 11/22/19 0537 11/23/19 0837 11/24/19 0306 11/26/19 0445 11/27/19 0732 11/28/19 0414  WBC 7.1   < > 15.6* 13.9* 11.0* 20.7* 17.4*  NEUTROABS 5.1  --  14.1* 12.2* 9.7* 19.1*  --   HGB 14.8   < > 15.2* 13.6 14.4  13.6 13.2  HCT 43.5   < > 44.3 40.2 41.8 39.7 38.9  MCV 84.8   < > 84.1 84.3 82.6 84.1 84.6  PLT 356   < > 454* 382 390 351 357   < > = values in this interval not displayed.   Basic Metabolic Panel: Recent Labs  Lab 11/22/19 0537 11/22/19 0537 11/23/19 0837 11/23/19 0837 11/24/19 0306 11/25/19 0312 11/25/19 0719 11/26/19 0445 11/27/19 0732 11/28/19 0414  NA 140   < > 140   < > 140 139  --  139 137 140  K 4.0   < > 3.9   < > 3.7 4.1  --  4.1 3.6 3.9  CL 104   < > 103   < > 106 106  --  106 105 105  CO2 25   < > 24   < > 24 25  --  24 22 26   GLUCOSE 138*   < > 123*   < > 126* 127*  --  138* 179* 137*  BUN 10   < > 14   < > 16 14  --  16 12 14   CREATININE 0.65   < > 0.79   < > 0.83 0.70  --  0.76 0.74 0.69  CALCIUM 9.0   < > 8.7*   < > 8.2* 8.6*  --  8.9 7.6* 8.1*  MG 1.9  --  2.1  --   --   --  2.1 2.0  --   --    < > = values in this interval not displayed.   GFR: Estimated Creatinine Clearance: 65.1 mL/min (by C-G formula based on SCr of 0.69 mg/dL). Liver Function Tests: Recent Labs  Lab 11/21/19 2137 11/22/19 0537 11/23/19 0837 11/26/19 0445  AST 37 32 24 15  ALT 35 34 29 22  ALKPHOS 52 53 53 45  BILITOT 0.4 0.7 0.4 0.7  PROT 7.2 7.4 7.1 6.0*  ALBUMIN  4.3 4.4 3.8 3.3*   No results for input(s): LIPASE, AMYLASE in the last 168 hours. No results for input(s): AMMONIA in the last 168 hours. Coagulation Profile: Recent Labs  Lab 11/25/19 0719 11/26/19 0445  INR 1.1 1.2   Cardiac Enzymes: No results for input(s): CKTOTAL, CKMB, CKMBINDEX, TROPONINI in the last 168 hours. BNP (last 3 results) No results for input(s): PROBNP in the last 8760 hours. HbA1C: No results for input(s): HGBA1C in the last 72 hours. CBG: No results for input(s): GLUCAP in the last 168 hours. Lipid Profile: No results for input(s): CHOL, HDL, LDLCALC, TRIG, CHOLHDL, LDLDIRECT in the last 72 hours. Thyroid Function Tests: No results for input(s): TSH, T4TOTAL, FREET4, T3FREE,  THYROIDAB in the last 72 hours. Anemia Panel: No results for input(s): VITAMINB12, FOLATE, FERRITIN, TIBC, IRON, RETICCTPCT in the last 72 hours. Urine analysis:    Component Value Date/Time   COLORURINE YELLOW 11/21/2019 2248   APPEARANCEUR HAZY (A) 11/21/2019 2248   LABSPEC 1.016 11/21/2019 2248   PHURINE 5.0 11/21/2019 2248   GLUCOSEU NEGATIVE 11/21/2019 2248   HGBUR MODERATE (A) 11/21/2019 2248   BILIRUBINUR NEGATIVE 11/21/2019 2248   KETONESUR NEGATIVE 11/21/2019 2248   PROTEINUR NEGATIVE 11/21/2019 2248   NITRITE NEGATIVE 11/21/2019 2248   LEUKOCYTESUR LARGE (A) 11/21/2019 2248     Adal Sereno M.D. [facility] 11/28/2019, 3:48 PM   Call night coverage person covering after 7pm

## 2019-11-28 NOTE — PMR Pre-admission (Signed)
PMR Admission Coordinator Pre-Admission Assessment  Patient: Wanda Ross is an 69 y.o., female MRN: [id number] DOB: 07-Feb-1951 Height: '5\' 8"'  (172.7 cm) Weight: 68.9 kg              Insurance Information HMO:     PPO: yes     PCP:      IPA:      80/20:      OTHER:  PRIMARY: [employer]      Policy#: [id number]      Subscriber: patient CM Name: Lattie Haw      Phone#: [phone] [id number]    Fax#: [phone] Pre-Cert#: [id number]      Employer:  Josem Kaufmann provided by Lattie Haw for admit to [facility] today, 12/02/19. Weekly updates are due to [employer] (p): [phone] [id number] and (f): [phone] Benefits:  Phone #: online     Name: [email or web address] Eff. Date: 05/30/19-05/28/20     Deduct: does not have for in-network providers      Out of Pocket Max: $4,000 ($80 met)      Life Max: NA  CIR: $160/day co-pay for days 1-10, $0/day co-pay for days 11+      SNF: $0/day co-pay for days 1-20, $50/day co-pay for days 21-100; limited to 100 days/cal yr Outpatient: $20/visit co-pay; visits limited by medical necessity      Home Health: 100% coverage, 0% co-insurance; limited by medical necessity      DME: 80% coverage, 20% co-insurance      Providers:  SECONDARY: None      Policy#:       Phone#:   Development worker, community:       Phone#:   The Therapist, art Information Summary" for patients in Inpatient Rehabilitation Facilities with attached "Privacy Act [place] Records" was provided and verbally reviewed with: Patient  Emergency Contact Information Contact Information    Name Relation Home Work Mobile   [place] Sister [phone]     Pita,[name] Daughter [phone]  [phone]     Current Medical History  Patient Admitting Diagnosis: Glioma  History of Present Illness:Wanda Ross is a 69 year old right-handed female with history of hyperlipidemia, hypertension.  Patient lives alone 1 level home 2 steps to entry.  Independent prior to admission.  She does have a daughter  and 2 sons who can assist as needed.  Presented 11/22/2019 with altered mental status and altered gait.  Admission chemistries with potassium 2.8, glucose 155, urinalysis negative nitrite.  CT/MRI showed contrast-enhancing mass within the left parietal white matter extending along the corpus callosum splenium, most consistent with high-grade glioma.  Moderate surrounding hyperintense T2 weighted signal likely combination of edema and nonenhancing tumor with a 3 mm rightward midline shift.  CT of the chest abdomen pelvis showed no acute findings.  Patient underwent left craniotomy for resection parietal and splenial mass on 11/26/2019 per Dr. Duffy Rhody.  Decadron protocol as indicated.  Maintained on Keppra for seizure prophylaxis.  Placed on Lovenox for DVT prophylaxis 11/27/2019.  Tolerating regular diet.  Therapy evaluations completed and patient is to be admitted for a comprehensive rehab program on 12/02/19.    Glasgow Coma Scale Score: 15  Past Medical History  Past Medical History:  Diagnosis Date  . Glaucoma   . Hyperlipidemia 2010  . Hypertension 1990  . IGT (impaired glucose tolerance) 05/11/2010  . Multinodular goiter 2010   normal function    Family History  family history includes Breast cancer in her sister; Dementia in her father and mother; Diabetes in her  brother and mother; Hypertension in her mother and sister.  Prior Rehab/Hospitalizations:  Has the patient had prior rehab or hospitalizations prior to admission? No  Has the patient had major surgery during 100 days prior to admission? Yes  Current Medications   Current Facility-Administered Medications:  .  acetaminophen (TYLENOL) tablet 650 mg, 650 mg, Oral, Q4H PRN, 650 mg at 11/27/19 1743 **OR** acetaminophen (TYLENOL) suppository 650 mg, 650 mg, Rectal, Q4H PRN, Rai, Ripudeep K, MD .  amLODipine (NORVASC) tablet 5 mg, 5 mg, Oral, Daily, Rai, Ripudeep K, MD, 5 mg at 12/02/19 0916 .  Chlorhexidine Gluconate Cloth 2 %  PADS 6 each, 6 each, Topical, Daily, Rai, Ripudeep K, MD, 6 each at 11/28/19 1600 .  cloNIDine (CATAPRES) tablet 0.3 mg, 0.3 mg, Oral, QHS, Rai, Ripudeep K, MD, 0.3 mg at 12/01/19 2149 .  [COMPLETED] dexamethasone (DECADRON) tablet 4 mg, 4 mg, Oral, Q6H, 4 mg at 11/29/19 0542 **FOLLOWED BY** [EXPIRED] dexamethasone (DECADRON) tablet 4 mg, 4 mg, Oral, Q8H, 4 mg at 12/01/19 0559 **FOLLOWED BY** dexamethasone (DECADRON) tablet 2 mg, 2 mg, Oral, Q8H, 2 mg at 12/02/19 0556 **FOLLOWED BY** [START ON 12/03/2019] dexamethasone (DECADRON) tablet 2 mg, 2 mg, Oral, Q12H **FOLLOWED BY** [START ON 12/05/2019] dexamethasone (DECADRON) tablet 1 mg, 1 mg, Oral, Q12H **FOLLOWED BY** [START ON 12/07/2019] dexamethasone (DECADRON) tablet 1 mg, 1 mg, Oral, Daily, Rai, Ripudeep K, MD .  docusate sodium (COLACE) capsule 100 mg, 100 mg, Oral, BID, Rai, Ripudeep K, MD, 100 mg at 12/02/19 0916 .  dorzolamide-timolol (COSOPT) 22.3-6.8 MG/ML ophthalmic solution 1 drop, 1 drop, Right Eye, BID, Rai, Ripudeep K, MD, 1 drop at 12/02/19 0916 .  enalaprilat (VASOTEC) injection 1.25 mg, 1.25 mg, Intravenous, Q6H PRN, Rai, Ripudeep K, MD, 1.25 mg at 12/02/19 0003 .  enoxaparin (LOVENOX) injection 40 mg, 40 mg, Subcutaneous, Q24H, Rai, Ripudeep K, MD, 40 mg at 12/02/19 0917 .  HYDROcodone-acetaminophen (NORCO/VICODIN) 5-325 MG per tablet 1 tablet, 1 tablet, Oral, Q4H PRN, Rai, Ripudeep K, MD, 1 tablet at 11/28/19 1235 .  insulin aspart (novoLOG) injection 0-9 Units, 0-9 Units, Subcutaneous, TID WC, Kamineni, Neelima, MD .  labetalol (NORMODYNE) injection 10 mg, 10 mg, Intravenous, Q10 min PRN, Rai, Ripudeep K, MD, 10 mg at 12/01/19 1948 .  levETIRAcetam (KEPPRA) tablet 500 mg, 500 mg, Oral, BID, Rai, Ripudeep K, MD, 500 mg at 12/02/19 0916 .  morphine 2 MG/ML injection 1-2 mg, 1-2 mg, Intravenous, Q2H PRN, Rai, Ripudeep K, MD, 2 mg at 11/26/19 1627 .  ondansetron (ZOFRAN) tablet 4 mg, 4 mg, Oral, Q4H PRN **OR** ondansetron (ZOFRAN) injection 4  mg, 4 mg, Intravenous, Q4H PRN, Rai, Ripudeep K, MD .  pantoprazole (PROTONIX) EC tablet 40 mg, 40 mg, Oral, Daily, Rai, Ripudeep K, MD, 40 mg at 12/02/19 0916 .  polyethylene glycol (MIRALAX / GLYCOLAX) packet 17 g, 17 g, Oral, Daily PRN, Rai, Ripudeep K, MD .  pravastatin (PRAVACHOL) tablet 10 mg, 10 mg, Oral, q1800, Rai, Ripudeep K, MD, 10 mg at 12/01/19 1748 .  promethazine (PHENERGAN) tablet 12.5-25 mg, 12.5-25 mg, Oral, Q4H PRN, Rai, Ripudeep K, MD .  senna (SENOKOT) tablet 8.6 mg, 1 tablet, Oral, BID, Rai, Ripudeep K, MD, 8.6 mg at 12/02/19 0917 .  sodium phosphate (FLEET) 7-19 GM/118ML enema 1 enema, 1 enema, Rectal, Once PRN, Rai, Ripudeep K, MD .  vitamin B-12 (CYANOCOBALAMIN) tablet 2,500 mcg, 2,500 mcg, Oral, Daily, Rai, Ripudeep K, MD, 2,500 mcg at 12/02/19 0916  Patients Current Diet:  Diet  Order            Diet regular Room service appropriate? Yes with Assist; Fluid consistency: Thin  Diet effective now                 Precautions / Restrictions Precautions Precautions: Fall Precaution Comments: R inattention Restrictions Weight Bearing Restrictions: No   Has the patient had 2 or more falls or a fall with injury in the past year?No  Prior Activity Level Community (5-7x/wk): active, enjoys outdoors, gardening, drove PTA, no AD use. Independent and lives alone  Prior Functional Level Prior Function Level of Independence: Independent Comments: pt independent, walks multiple miles per day in her neighborhood.   Self Care: Did the patient need help bathing, dressing, using the toilet or eating?  Independent  Indoor Mobility: Did the patient need assistance with walking from room to room (with or without device)? Independent  Stairs: Did the patient need assistance with internal or external stairs (with or without device)? Independent  Functional Cognition: Did the patient need help planning regular tasks such as shopping or remembering to take medications?  Independent  Home Assistive Devices / Equipment Home Assistive Devices/Equipment: None Home Equipment: Walker - 2 wheels  Prior Device Use: Indicate devices/aids used by the patient prior to current illness, exacerbation or injury? None of the above  Current Functional Level Cognition  Arousal/Alertness: Awake/alert Overall Cognitive Status: Impaired/Different from baseline Current Attention Level: Selective Orientation Level: Oriented X4 Following Commands: Follows one step commands with increased time Safety/Judgement: Decreased awareness of safety, Decreased awareness of deficits General Comments: pt with decreased attention and memory;needing minimal cues to scan and locate numbers on wall and retrieve numbers in chronological order both ascending and descending (0-10) and locating items on left and right side of cabinet in superior and inferior field of vision;following session, pt able to recount events of today's session and relay them to PT upon her arrival;pt required mincues for scanning environment; Attention: Sustained Sustained Attention: Impaired Sustained Attention Impairment: Verbal basic Memory: Impaired Memory Impairment: Storage deficit, Retrieval deficit, Decreased recall of new information, Decreased short term memory Decreased Short Term Memory: Verbal basic Awareness: Impaired Awareness Impairment: Intellectual impairment, Emergent impairment, Anticipatory impairment Problem Solving: Impaired Problem Solving Impairment: Verbal basic, Functional complex Safety/Judgment: Impaired    Extremity Assessment (includes Sensation/Coordination)  Upper Extremity Assessment: Overall WFL for tasks assessed  Lower Extremity Assessment: Defer to PT evaluation RLE Deficits / Details: Desptie maintaining R LE strength, her leg is slower to coordinate movement to command and it takes manual and tactile cues to get her to do muscle testing to command on her right side instead of  repeatedly on the left when asked to do it on the right.  RLE Coordination: decreased fine motor, decreased gross motor (slow/delayed motor response)    ADLs  Overall ADL's : Needs assistance/impaired Eating/Feeding: Set up, Supervision/ safety, Sitting Grooming: Standing, Supervision/safety Grooming Details (indicate cue type and reason): cues for scanning  Upper Body Bathing: Supervision/ safety, Set up, Sitting Lower Body Bathing: Minimal assistance, Sit to/from stand Upper Body Dressing : Minimal assistance, Sitting Lower Body Dressing: Minimal assistance, Sit to/from stand Toilet Transfer: Supervision/safety, Cueing for safety, RW Toilet Transfer Details (indicate cue type and reason): simulated with in room mobility;min cues to scan environment Toileting- Clothing Manipulation and Hygiene: Min guard, Sit to/from stand Functional mobility during ADLs: Supervision/safety, Cueing for safety, Rolling walker General ADL Comments: cues for scanning environment during in room mobility;pt limited by Right inattention  and visual field cut    Mobility  Overal bed mobility: Needs Assistance Bed Mobility: Supine to Sit Supine to sit: Supervision General bed mobility comments: supervision for safety    Transfers  Overall transfer level: Needs assistance Equipment used: Rolling walker (2 wheeled) Transfers: Sit to/from Stand Sit to Stand: Min guard General transfer comment: minguard for safety    Ambulation / Gait / Stairs / Wheelchair Mobility  Ambulation/Gait Ambulation/Gait assistance: Herbalist (Feet): 180 Feet Assistive device: 1 person hand held assist Gait Pattern/deviations: Step-through pattern, Staggering right, Staggering left General Gait Details: Pt with mildly staggering gait pattern.  Cues to look R when doing pathfinding mission in the hallway.  Significant difficulty problem solving through the path finding needing cues re: room number (I had to tell her  multiple times her room number), figuring out where to go and looking R Gait velocity: decreased Gait velocity interpretation: 1.31 - 2.62 ft/sec, indicative of limited community ambulator    Posture / Balance Dynamic Sitting Balance Sitting balance - Comments: close supervision EOB, not tested further, but pt seated on her hand.  Balance Overall balance assessment: Needs assistance Sitting-balance support: Feet supported, No upper extremity supported Sitting balance-Leahy Scale: Good Sitting balance - Comments: close supervision EOB, not tested further, but pt seated on her hand.  Standing balance support: Single extremity supported, During functional activity Standing balance-Leahy Scale: Fair Standing balance comment: reaching outside base of support with single UE support    Special needs/care consideration Skin surgical incision to left side of head     Previous Home Environment (from acute therapy documentation) Living Arrangements: Alone  Lives With: Daughter Available Help at Discharge: Family Type of Home: House Home Layout: One level Home Access: Stairs to enter Entrance Stairs-Rails: None Technical brewer of Steps: 2 Biochemist, clinical: Standard Home Care Services: No Additional Comments: Pt is not using an AD at home currently  Discharge Living Setting Plans for Discharge Living Setting: House Type of Home at Discharge: House Discharge Home Layout: One level Discharge Home Access: Stairs to enter Entrance Stairs-Rails: None Entrance Stairs-Number of Steps: 2 Discharge Bathroom Shower/Tub: Walk-in shower Discharge Bathroom Toilet: Standard Discharge Bathroom Accessibility: Yes How Accessible: Accessible via walker Does the patient have any problems obtaining your medications?: No  Social/Family/Support Systems Patient Roles: Other (Comment) (has family close by) Contact Information: Daughter: Warren Lacy [phone] Anticipated Caregiver: [name] + other family  members  Anticipated Caregiver's Contact Information: see above Ability/Limitations of Caregiver: Supervision  Caregiver Availability: 24/7 (per daughter, [name], the patient's daughters will do day coverage and her son's will cover the nighttime.  Discharge Plan Discussed with Primary Caregiver: Yes, pt and her daughter, [name] Is Caregiver In Agreement with Plan?: Yes Does Caregiver/Family have Issues with Lodging/Transportation while Pt is in Rehab?: No   Goals Patient/Family Goal for Rehab: PT/OT: Supervision; SLP: Mod I/Supervision Expected length of stay: 5-7 days Pt/Family Agrees to Admission and willing to participate: Yes Program Orientation Provided & Reviewed with Pt/Caregiver Including Roles  & Responsibilities: Yes  Barriers to Discharge: Home environment access/layout, Lack of/limited family support  Barriers to Discharge Comments: steps to enter home; family currently working to address 24/7 Supervision.    Decrease burden of Care through IP rehab admission: NA   Possible need for SNF placement upon discharge: Not anticipated. Pt should progress to supervision level which anticipate pt's family can provide.    Patient Condition: This patient's medical and functional status has changed since the consult dated: 11/28/19  in which the Rehabilitation Physician determined and documented that the patient's condition is appropriate for intensive rehabilitative care in an inpatient rehabilitation facility. See "History of Present Illness" (above) for medical update. Functional changes are: improvement in transfers from Rembrandt A to Min G, improvement in gait from Min A +2 of 75 feet to Min A 180 feet; Pt has also been evaluated by OT since consult completed with recommendation for CIR. Patient's medical and functional status update has been discussed with the Rehabilitation physician and patient remains appropriate for inpatient rehabilitation. Will admit to inpatient rehab today.   Preadmission  Screen Completed By:  Raechel Ache, OT, 12/02/2019 11:41 AM ______________________________________________________________________     Discussed status with Dr. Ranell Patrick on 12/02/19 at 10:50AM and received approval for admission today.  Admission Coordinator:  Raechel Ache, time 10:50AM/Date 12/02/19.

## 2019-11-28 NOTE — Consult Note (Signed)
Physical Medicine and Rehabilitation Consult Reason for Consult: Gait disorder Referring Physician: [facility]   HPI: Wanda Ross is a 69 y.o. right-handed female with history of hyperlipidemia, hypertension.  History taken from chart review, son, patient, and therapies.  Patient lives alone.  1 level home 2 steps to entry.  Independent prior to admission.  She does have a daughter and 2 sons that can assist as needed.  She presented on 11/22/2019 with AMS and altered gait. CT/MRI showed contrast-enhancing mass within the left parietal white matter extending along the corpus callosum splenium, most consistent with high-grade glioma.  Moderate surrounding hyperintense T2 weighted signal likely combination of edema and nonenhancing tumor. 3 mm rightward midline shift.  CT of the chest abdomen pelvis showed no acute findings.  Patient underwent left craniotomy for resection parietal and splenial mass on 11/26/2019 per Dr. Duffy Rhody.  Decadron protocol as indicated.  Placed on Lovenox for DVT prophylaxis. Tolerating a regular diet.  Therapy evaluations completed with recommendations of physical medicine rehab consult.  Discussed with therapies, patient appears to have had some improvement with cognitive deficits as well as retention since yesterday.   Review of Systems  Constitutional: Negative for chills and fever.  HENT: Negative for hearing loss.   Eyes: Negative for blurred vision and double vision.  Respiratory: Negative for cough and shortness of breath.   Cardiovascular: Negative for chest pain, palpitations and leg swelling.  Gastrointestinal: Positive for constipation. Negative for heartburn, nausea and vomiting.  Genitourinary: Negative for dysuria, flank pain and hematuria.  Skin: Negative for rash.  Neurological: Positive for focal weakness, weakness and headaches.       Gait disturbance  All other systems reviewed and are negative.  Past Medical History:  Diagnosis Date   . Glaucoma   . Hyperlipidemia 2010  . Hypertension 1990  . IGT (impaired glucose tolerance) 05/11/2010  . Multinodular goiter 2010   normal function   Past Surgical History:  Procedure Laterality Date  . APPLICATION OF CRANIAL NAVIGATION Left 11/26/2019   Procedure: APPLICATION OF CRANIAL NAVIGATION;  Surgeon: [name], MD;  Location: [place];  Service: Neurosurgery;  Laterality: Left;  APPLICATION OF CRANIAL NAVIGATION  . COLONOSCOPY N/A 06/16/2016   Procedure: COLONOSCOPY;  Surgeon: Danie Binder, MD;  [facility];  Service: Endoscopy;  Laterality: N/A;  9:30 AM  . COLONOSCOPY N/A 10/22/2019   Procedure: COLONOSCOPY;  Surgeon: Daneil Dolin, MD;  [facility];  Service: Endoscopy;  Laterality: N/A;  9:30  . CRANIOTOMY Left 11/26/2019   Procedure: LEFT PARIETAL CRANIOTOMY FOR RESECTION OF TUMOR;  Surgeon: [name], MD;  Location: [place];  Service: Neurosurgery;  Laterality: Left;  LEFT PARIETAL CRANIOTOMY FOR RESECTION OF TUMOR  . EYE SURGERY  2017   3 on left eye 1 on right eye for glaucoma by Dr. Venetia Maxon   . EYE SURGERY  2018   Cataract Surgery by Dr. Arlina Robes   . OPERATIVE ULTRASOUND N/A 11/26/2019   Procedure: OPERATIVE ULTRASOUND;  Surgeon: [name], MD;  Location: [place];  Service: Neurosurgery;  Laterality: N/A;  OPERATIVE ULTRASOUND   Family History  Problem Relation Age of Onset  . Dementia Father   . Dementia Mother   . Diabetes Mother   . Hypertension Mother   . Breast cancer Sister   . Hypertension Sister   . Diabetes Brother    Social History:  reports that she has never smoked. She has  never used smokeless tobacco. She reports that she does not drink alcohol and does not use drugs. Allergies: No Known Allergies Medications Prior to Admission  Medication Sig Dispense Refill  . alendronate (FOSAMAX) 70 MG tablet Take 1 tablet (70 mg total) by mouth every 7 (seven) days. Take with a full glass of water on an empty  stomach. 4 tablet 11  . amLODipine (NORVASC) 5 MG tablet Take 1 tablet (5 mg total) by mouth daily. 90 tablet 1  . Calcium Carbonate-Vit D-Min (CALCIUM 1200) 1200-1000 MG-UNIT CHEW Chew 1,200 mg by mouth daily.      . cloNIDine (CATAPRES) 0.3 MG tablet TAKE 1 TABLET BY MOUTH EVERYDAY AT BEDTIME (Patient taking differently: Take 0.3 mg by mouth at bedtime. ) 90 tablet 0  . Cyanocobalamin (B-12) 2500 MCG SUBL Place 1 tablet under the tongue daily.    . DENTA 5000 PLUS 1.1 % CREA dental cream Place 1 application onto teeth at bedtime.     . dorzolamide-timolol (COSOPT) 22.3-6.8 MG/ML ophthalmic solution Place 1 drop into the right eye 2 (two) times daily.     . Multiple Vitamins-Minerals (CENTRUM SILVER ULTRA WOMENS) TABS Take 1 tablet by mouth daily.     Marland Kitchen triamterene-hydrochlorothiazide (MAXZIDE) 75-50 MG tablet TAKE 1 TABLET BY MOUTH EVERY DAY (Patient taking differently: Take 0.5 tablets by mouth daily. ) 90 tablet 1  . cholecalciferol (VITAMIN D) 25 MCG (1000 UNIT) tablet Take 1,000 mg by mouth daily.       Home: Home Living Family/patient expects to be discharged to:: Inpatient rehab Living Arrangements: Alone Available Help at Discharge: Family (daughter [name] present today) Type of Home: House Home Access: Stairs to enter [employer] of Steps: 2 Entrance Stairs-Rails: None Home Layout: One level Biochemist, clinical: Standard Home Equipment: Environmental consultant - 2 wheels Additional Comments: Pt is not using an AD at home currently  Lives With: Daughter  Functional History: Prior Function Level of Independence: Independent Comments: pt independent, walks multiple miles per day in her neighborhood.  Functional Status:  Mobility: Bed Mobility Overal bed mobility: Needs Assistance Bed Mobility: Supine to Sit Supine to sit: Supervision General bed mobility comments: decreased awareness of where the edge of the bed is, so close supervision for safety.  Transfers Overall transfer level:  Needs assistance Equipment used: 2 person hand held assist Transfers: Sit to/from Stand Sit to Stand: Min assist General transfer comment: Min bil hand held assist for safety to come to standing and balance.  Ambulation/Gait Ambulation/Gait assistance: +2 physical assistance, Min assist Gait Distance (Feet): 75 Feet Assistive device: 2 person hand held assist Gait Pattern/deviations: Step-through pattern, Staggering left, Staggering right, Decreased step length - right General Gait Details: PT on one side (R) daughter on the other side assisting pt with gait into the hallway, gaze is to the left unless cued (multimodal) to attend to the right.  R leg is a bit slower to progress forward during reciprocal gait, but does not show signs of significant drag, knee instability or buckling during gait.   Gait velocity: decreased Gait velocity interpretation: 1.31 - 2.62 ft/sec, indicative of limited community ambulator    ADL:    Cognition: Cognition Overall Cognitive Status: Impaired/Different from baseline Arousal/Alertness: Awake/alert Orientation Level: Oriented X4 Attention: Sustained Sustained Attention: Impaired Sustained Attention Impairment: Verbal basic Memory: Impaired Memory Impairment: Storage deficit, Retrieval deficit, Decreased recall of new information, Decreased short term memory Decreased Short Term Memory: Verbal basic Awareness: Impaired Awareness Impairment: Intellectual impairment, Emergent impairment, Anticipatory impairment  Problem Solving: Impaired Problem Solving Impairment: Verbal basic, Functional complex Safety/Judgment: Impaired Cognition Arousal/Alertness: Awake/alert Behavior During Therapy: Impulsive Overall Cognitive Status: Impaired/Different from baseline Area of Impairment: Following commands, Safety/judgement, Awareness, Problem solving Current Attention Level: Sustained Following Commands: Follows one step commands  inconsistently Safety/Judgement: Decreased awareness of safety, Decreased awareness of deficits Awareness: Intellectual Problem Solving: Difficulty sequencing, Requires verbal cues, Requires tactile cues General Comments: Pt with difficulty understanding instructions even simple ones, right sided inattention on top of likely R visual deficit, and decreased ability to process and then sequenc through even basic commands without multimodal cueing, rephrasing or repetition.   Blood pressure 117/65, pulse 61, temperature 98.5 F (36.9 C), temperature source Oral, resp. rate 15, height 5\' 8"  (1.727 m), weight 68.9 kg, SpO2 100 %. Physical Exam Vitals reviewed.  Constitutional:      General: She is not in acute distress.    Appearance: She is obese.  HENT:     Head: Normocephalic.     Comments: Crani site C/D/I    Right Ear: External ear normal.     Left Ear: External ear normal.     Nose: Nose normal.  Eyes:     General:        Right eye: No discharge.        Left eye: No discharge.     Extraocular Movements: Extraocular movements intact.  Pulmonary:     Effort: Pulmonary effort is normal. No respiratory distress.     Breath sounds: No stridor.  Abdominal:     General: Abdomen is flat.     Palpations: Abdomen is soft.  Musculoskeletal:     Cervical back: Neck supple.     Comments: No edema or tenderness in extremities  Skin:    General: Skin is warm and dry.     Comments: Crani site C/D/I  Neurological:     Mental Status: She is alert.     Comments: Alert and oriented x3 Mild right inattention LUE/LE: 5/5 proximal distal RUE/RLE: 4+/5 proximal distal Follows simple commands.  Psychiatric:        Mood and Affect: Mood normal.        Behavior: Behavior normal.     Results for orders placed or performed during the hospital encounter of 11/21/19 (from the past 24 hour(s))  Basic metabolic panel     Status: Abnormal   Collection Time: 11/27/19  7:32 AM  Result Value Ref  Range   Sodium 137 135 - 145 mmol/L   Potassium 3.6 3.5 - 5.1 mmol/L   Chloride 105 98 - 111 mmol/L   CO2 22 22 - 32 mmol/L   Glucose, Bld 179 (H) 70 - 99 mg/dL   BUN 12 8 - 23 mg/dL   Creatinine, Ser 0.74 0.44 - 1.00 mg/dL   Calcium 7.6 (L) 8.9 - 10.3 mg/dL   GFR calc non Af Amer >60 >60 mL/min   GFR calc Af Amer >60 >60 mL/min   Anion gap 10 5 - 15  CBC with Differential     Status: Abnormal   Collection Time: 11/27/19  7:32 AM  Result Value Ref Range   WBC 20.7 (H) 4.0 - 10.5 K/uL   RBC 4.72 3.87 - 5.11 MIL/uL   Hemoglobin 13.6 12.0 - 15.0 g/dL   HCT 39.7 36 - 46 %   MCV 84.1 80.0 - 100.0 fL   MCH 28.8 26.0 - 34.0 pg   MCHC 34.3 30.0 - 36.0 g/dL   RDW 13.2 11.5 -  15.5 %   Platelets 351 150 - 400 K/uL   nRBC 0.0 0.0 - 0.2 %   Neutrophils Relative % 92 %   Neutro Abs 19.1 (H) 1.7 - 7.7 K/uL   Lymphocytes Relative 2 %   Lymphs Abs 0.4 (L) 0.7 - 4.0 K/uL   Monocytes Relative 5 %   Monocytes Absolute 1.1 (H) 0 - 1 K/uL   Eosinophils Relative 0 %   Eosinophils Absolute 0.0 0 - 0 K/uL   Basophils Relative 0 %   Basophils Absolute 0.0 0 - 0 K/uL   Immature Granulocytes 1 %   Abs Immature Granulocytes 0.14 (H) 0.00 - 0.07 K/uL  CBC     Status: Abnormal   Collection Time: 11/28/19  4:14 AM  Result Value Ref Range   WBC 17.4 (H) 4.0 - 10.5 K/uL   RBC 4.60 3.87 - 5.11 MIL/uL   Hemoglobin 13.2 12.0 - 15.0 g/dL   HCT 38.9 36 - 46 %   MCV 84.6 80.0 - 100.0 fL   MCH 28.7 26.0 - 34.0 pg   MCHC 33.9 30.0 - 36.0 g/dL   RDW 13.6 11.5 - 15.5 %   Platelets 357 150 - 400 K/uL   nRBC 0.0 0.0 - 0.2 %  Basic metabolic panel     Status: Abnormal   Collection Time: 11/28/19  4:14 AM  Result Value Ref Range   Sodium 140 135 - 145 mmol/L   Potassium 3.9 3.5 - 5.1 mmol/L   Chloride 105 98 - 111 mmol/L   CO2 26 22 - 32 mmol/L   Glucose, Bld 137 (H) 70 - 99 mg/dL   BUN 14 8 - 23 mg/dL   Creatinine, Ser 0.69 0.44 - 1.00 mg/dL   Calcium 8.1 (L) 8.9 - 10.3 mg/dL   GFR calc non Af Amer >60  >60 mL/min   GFR calc Af Amer >60 >60 mL/min   Anion gap 9 5 - 15   MR BRAIN W WO CONTRAST  Result Date: 11/27/2019 CLINICAL DATA:  Postop day 1 following resection of a left parietal tumor (likely high-grade glioma). EXAM: MRI HEAD WITHOUT AND WITH CONTRAST TECHNIQUE: Multiplanar, multiecho pulse sequences of the brain and surrounding structures were obtained without and with intravenous contrast. CONTRAST:  20mL GADAVIST GADOBUTROL 1 MMOL/ML IV SOLN COMPARISON:  11/22/2019 FINDINGS: Brain: Sequelae of interval left parietal craniotomy and tumor resection are identified. There is a resection cavity in the left parietal lobe and splenium of the corpus callosum with a small amount of blood products. A small amount of restricted diffusion is noted along the margins of the resection cavity without a sizable acute infarct. There is a small amount of enhancement along the margins of the resection cavity which is largely curvilinear and likely postoperative. There is more nodular enhancement at the anterior aspect of the resection site at the level of the atrium of the left lateral ventricle which is felt to at least partially reflect choroid plexus. There is moderate nonenhancing T2 hyperintensity/edema in the white matter of the left parietal, occipital, and posterior temporal lobes which has decreased from the prior study. There is persistent nonenhancing T2 hyperintensity which crosses the splenium of the corpus callosum into the right periatrial white matter. There is trace residual rightward midline shift, decreased from prior. There is small volume pneumocephalus, and there is a small extra-axial fluid collection subjacent to the craniotomy. Mild dural thickening and enhancement over the posterior left cerebral convexity is likely postoperative. Vascular: Major intracranial  vascular flow voids are preserved. Skull and upper cervical spine: Left parietal craniotomy with small volume fluid and gas in the scalp  soft tissues. Sinuses/Orbits: Left cataract extraction. Paranasal sinuses and mastoid air cells are clear. Other: None. IMPRESSION: 1. Postoperative changes from interval left parietal and splenial tumor resection. Mild enhancement along the margins of the resection cavity as above. This will serve as a baseline for follow-up examinations. 2. Decreased surrounding T2 hyperintensity compatible with decreased edema and likely underlying nonenhancing tumor. Electronically Signed   By: Logan Bores M.D.   On: 11/27/2019 13:58   [place] Intraoperative  Result Date: 11/26/2019 CLINICAL DATA:  Ultrasound was provided for use by the ordering physician, and a technical charge was applied by the performing facility.  No radiologist interpretation/professional services rendered.    Assessment/Plan: Diagnosis: Glioma Labs independently reviewed.  Records reviewed and summated above.  1. Does the need for close, 24 hr/day medical supervision in concert with the patient's rehab needs make it unreasonable for this patient to be served in a less intensive setting? Potentially 2. Co-Morbidities requiring supervision/potential complications: hyperlipidemia, HTN (monitor and provide prns in accordance with increased physical exertion and pain), steroid-induced hyperglycemia (Monitor in accordance with exercise and adjust meds as necessary), leukocytosis (repeat labs, cont to monitor for signs and symptoms of infection, further workup if indicated-likely secondary to steroids) 3. Due to safety, disease management, medication administration and patient education, does the patient require 24 hr/day rehab nursing? Yes 4. Does the patient require coordinated care of a physician, rehab nurse, therapy disciplines of PT/OT/SLP to address physical and functional deficits in the context of the above medical diagnosis(es)? Potentially Addressing deficits in the following areas: balance, endurance, locomotion, strength, transferring,  bathing, dressing, toileting, cognition and psychosocial support 5. Can the patient actively participate in an intensive therapy program of at least 3 hrs of therapy per day at least 5 days per week? Yes 6. The potential for patient to make measurable gains while on inpatient rehab is good 7. Anticipated functional outcomes upon discharge from inpatient rehab are supervision  with PT, supervision with OT, modified independent and supervision with SLP. 8. Estimated rehab length of stay to reach the above functional goals is: 5-7 days. 9. Anticipated discharge destination: Home 10. Overall Rehab/Functional Prognosis: good  RECOMMENDATIONS: This patient's condition is appropriate for continued rehabilitative care in the following setting: Will await reevaluation by therapies.  Will consider CIR patient does not progress to supervision level of functioning. Patient has agreed to participate in recommended program. Yes Note that insurance prior authorization may be required for reimbursement for recommended care.  Comment: Rehab Admissions Coordinator to follow up.  I have personally performed a face to face diagnostic evaluation, including, but not limited to relevant history and physical exam findings, of this patient and developed relevant assessment and plan.  Additionally, I have reviewed and concur with the physician assistant's documentation above.   Delice Lesch, MD, ABPMR Lavon Paganini Angiulli, PA-C 11/28/2019

## 2019-11-29 LAB — BASIC METABOLIC PANEL
Anion gap: 9 (ref 5–15)
BUN: 19 mg/dL (ref 8–23)
CO2: 25 mmol/L (ref 22–32)
Calcium: 8.3 mg/dL — ABNORMAL LOW (ref 8.9–10.3)
Chloride: 105 mmol/L (ref 98–111)
Creatinine, Ser: 0.67 mg/dL (ref 0.44–1.00)
GFR calc Af Amer: >60 mL/min (ref >60)
GFR calc non Af Amer: >60 mL/min (ref >60)
Glucose, Bld: 144 mg/dL — ABNORMAL HIGH (ref 70–99)
Potassium: 3.9 mmol/L (ref 3.5–5.1)
Sodium: 139 mmol/L (ref 135–145)

## 2019-11-29 LAB — CBC
HCT: 39.8 % (ref 36.0–46.0)
Hemoglobin: 13.4 g/dL (ref 12.0–15.0)
MCH: 28.1 pg (ref 26.0–34.0)
MCHC: 33.7 g/dL (ref 30.0–36.0)
MCV: 83.4 fL (ref 80.0–100.0)
Platelets: 347 10*3/uL (ref 150–400)
RBC: 4.77 MIL/uL (ref 3.87–5.11)
RDW: 13.3 % (ref 11.5–15.5)
WBC: 12.9 10*3/uL — ABNORMAL HIGH (ref 4.0–10.5)
nRBC: 0 % (ref 0.0–0.2)

## 2019-11-29 NOTE — Progress Notes (Signed)
[facility]                                                                              Patient Demographics  Wanda Ross, is a 69 y.o. female, DOB - 09/19/1950, EXN:[id number]  Admit date - 11/21/2019   Admitting Physician Oswald Hillock, MD  Outpatient Primary MD for the patient is Fayrene Helper, MD  Outpatient specialists:   LOS - 7  days   Medical records reviewed and are as summarized below:    Chief Complaint  Patient presents with  . Altered Mental Status       Brief summary   Per HPI: [name] a4 y.o.female,with history of glaucoma, hyperlipidemia, hypertension was brought to the ED due to confusion and gait imbalance. As per patient daughter, patient was having intermittent confusion.Patient denies chest pain, shortness of breath, nausea, vomiting or diarrhea. Denies blurred vision. No speech disturbance. Patient complained of headache, poor appetite for past 2 weeks. 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 Denies abdominal pain or dysuria, Denies weight loss. No previous history of malignancy In the ED CT of the head was obtained which showed ill-defined 3.2 cm mass within the left parietal white matter, compatible malignancy. 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 Extensive vasogenic edema noted throughout the left cerebral hemisphere with significant sulcal effacement and minimal rightward midline shift. Neurosurgeon Dr. Marcello Moores was consulted by ED physician who recommended Decadron, MRI brain with and without contrast and transfer to [place]    Brain mass/glioma-new diagnosis -CT head showed brain mass with vasogenic edema throughout the left cerebral hemisphere with minimal rightward midline shift -Patient was started on Decadron, no acute neurological deficits were noted, neurosurgery was consulted. -CT chest abdomen and pelvis showed no acute findings within the chest abdomen and pelvis, no  evidence of primary malignancy.  Baseline EKG reviewed, no acute ST-T wave changes. -Patient underwent OR on 6/30, status post left craniotomy for resection of parietal and splenial mass -Per pathology, Dr. 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  -d/w Neurosurgery, Dr Marcello Moores on 7/2, have discussed with Dr. Mickeal Skinner and oincology, plan for possible XRT in 2-3 weeks and chemo, will be determined by the biopsy results.  -Postop day #3, ambulating in the room, no dizziness, no headaches, cognitive slowing improving -Awaiting CIR  Abnormal UA -Urine culture showed 60,000 colonies staph epidermidis -Hold off on antibiotics, no fevers, alert and oriented  Essential hypertension -BP currently stable  Hyperlipidemia Continue pravastatin   Code Status: Full DVT Prophylaxis:  SCD's Family Communication: Discussed all imaging results, lab results, explained to the patient.    Disposition Plan:     Status is: Inpatient  Remains inpatient appropriate because:Inpatient level of care appropriate due to severity of illness   Dispo: The patient is from: Home              Anticipated d/c is to: TBD              Anticipated d/c date is: > 3 days  Patient currently is medically stable to d/c. CIR pending insurance approval       Time Spent in minutes 25 minutes  Procedures:  Procedure: 1.  Left craniotomy for resection parietal and splenial mass 2.  Use of computer-assisted neuro navigation   Consultants:   Neurosurgery  Antimicrobials:   Anti-infectives (From admission, onward)   Start     Dose/Rate Route Frequency Ordered Stop   11/26/19 1600  ceFAZolin (ANCEF) IVPB 1 g/50 mL premix        1 g 100 mL/hr over 30 Minutes Intravenous Every 8 hours 11/26/19 1404 11/27/19 0838   11/26/19 0600  ceFAZolin (ANCEF) IVPB 2g/100 mL premix        2 g 200 mL/hr over 30 Minutes Intravenous To Short Stay  11/25/19 1804 11/26/19 0810   11/24/19 1530  clindamycin (CLEOCIN) capsule 300 mg  Status:  Discontinued        300 mg Oral 2 times daily 11/24/19 1518 11/25/19 0929   11/23/19 1400  ampicillin-sulbactam (UNASYN) 1.5 g in sodium chloride 0.9 % 100 mL IVPB  Status:  Discontinued        1.5 g 200 mL/hr over 30 Minutes Intravenous Every 6 hours 11/23/19 1325 11/24/19 1518   11/22/19 0800  cefTRIAXone (ROCEPHIN) 1 g in sodium chloride 0.9 % 100 mL IVPB  Status:  Discontinued        1 g 200 mL/hr over 30 Minutes Intravenous Every 24 hours 11/22/19 0320 11/22/19 0444   11/22/19 0445  cefTRIAXone (ROCEPHIN) 1 g in sodium chloride 0.9 % 100 mL IVPB  Status:  Discontinued        1 g 200 mL/hr over 30 Minutes Intravenous Every 24 hours 11/22/19 0444 11/23/19 1325   11/21/19 2345  cephALEXin (KEFLEX) capsule 500 mg        500 mg Oral  Once 11/21/19 2330 11/22/19 0034         Medications  Scheduled Meds: . amLODipine  5 mg Oral Daily  . Chlorhexidine Gluconate Cloth  6 each Topical Daily  . cloNIDine  0.3 mg Oral QHS  . dexamethasone  4 mg Oral Q8H   Followed by  . [START ON 12/01/2019] dexamethasone  2 mg Oral Q8H   Followed by  . [START ON 12/03/2019] dexamethasone  2 mg Oral Q12H   Followed by  . [START ON 12/05/2019] dexamethasone  1 mg Oral Q12H   Followed by  . [START ON 12/07/2019] dexamethasone  1 mg Oral Daily  . docusate sodium  100 mg Oral BID  . dorzolamide-timolol  1 drop Right Eye BID  . enoxaparin (LOVENOX) injection  40 mg Subcutaneous Q24H  . levETIRAcetam  500 mg Oral BID  . pantoprazole  40 mg Oral Daily  . pravastatin  10 mg Oral q1800  . senna  1 tablet Oral BID  . vitamin B-12  2,500 mcg Oral Daily   Continuous Infusions:  PRN Meds:.acetaminophen **OR** acetaminophen, enalaprilat, HYDROcodone-acetaminophen, labetalol, morphine injection, ondansetron **OR** ondansetron (ZOFRAN) IV, polyethylene glycol, promethazine, sodium phosphate      Subjective:   [name] was seen and examined today.  No headaches, no worsening of visual deficits.  Ambulating in the room.  No acute events overnight. No chest pain, shortness of breath, nausea vomiting or abdominal pain. No focal weakness.   Objective:   Vitals:   11/28/19 1945 11/28/19 2318 11/29/19 0405 11/29/19 0816  BP:  137/69 119/60 122/65  Pulse:  64 63 67  Resp:  16 18 16   Temp: 97.8 F (36.6 C) 98 F (36.7 C) 97.8 F (36.6 C) 98.2 F (36.8 C)  TempSrc: Oral Oral Oral Oral  SpO2:  96% 98% 98%  Weight:      Height:       No intake or output data in the 24 hours ending 11/29/19 1036   Wt Readings from Last 3 Encounters:  11/21/19 68.9 kg  10/22/19 68.9 kg  06/04/19 68.9 kg   Physical Exam  General: Alert and oriented x 3, NAD  Cardiovascular: S1 S2 clear, RRR. No pedal edema b/l  Respiratory: CTAB, no wheezing, rales or rhonchi  Gastrointestinal: Soft, nontender, nondistended, NBS  Ext: no pedal edema bilaterally  Neuro: no new deficits  Musculoskeletal: No cyanosis, clubbing  Skin: No rashes  Psych: Normal affect and demeanor, alert and oriented x3     Data Reviewed:  I have personally reviewed following labs and imaging studies  Micro Results Recent Results (from the past 240 hour(s))  Urine culture     Status: Abnormal   Collection Time: 11/21/19 10:48 PM   Specimen: Urine, Clean Catch  Result Value Ref Range Status   Specimen Description   Final    URINE, CLEAN CATCH Performed at [facility], [address] [place], [place] [address]    Special Requests   Final    NONE Performed at [facility], [address] [place], [place] [address]    Culture (A)  Final    60,000 COLONIES/mL GROUP B STREP(S.AGALACTIAE)ISOLATED TESTING AGAINST S. AGALACTIAE NOT ROUTINELY PERFORMED DUE TO PREDICTABILITY OF AMP/PEN/VAN SUSCEPTIBILITY. 30,000 COLONIES/mL STAPHYLOCOCCUS EPIDERMIDIS    Report Status 11/24/2019 FINAL  Final   Organism ID, Bacteria STAPHYLOCOCCUS  EPIDERMIDIS (A)  Final      Susceptibility   Staphylococcus epidermidis - MIC*    CIPROFLOXACIN <=0.5 SENSITIVE Sensitive     GENTAMICIN <=0.5 SENSITIVE Sensitive     NITROFURANTOIN <=16 SENSITIVE Sensitive     OXACILLIN >=4 RESISTANT Resistant     TETRACYCLINE <=1 SENSITIVE Sensitive     VANCOMYCIN 2 SENSITIVE Sensitive     TRIMETH/SULFA <=10 SENSITIVE Sensitive     CLINDAMYCIN <=0.25 SENSITIVE Sensitive     RIFAMPIN <=0.5 SENSITIVE Sensitive     Inducible Clindamycin NEGATIVE Sensitive     * 30,000 COLONIES/mL STAPHYLOCOCCUS EPIDERMIDIS  SARS Coronavirus 2 by RT PCR (hospital order, performed in [facility] hospital lab) Nasopharyngeal Nasopharyngeal Swab     Status: None   Collection Time: 11/22/19 12:41 AM   Specimen: Nasopharyngeal Swab  Result Value Ref Range Status   SARS Coronavirus 2 NEGATIVE NEGATIVE Final    Comment: (NOTE) SARS-CoV-2 target nucleic acids are NOT DETECTED.  The SARS-CoV-2 RNA is generally detectable in upper and lower respiratory specimens during the acute phase of infection. The lowest concentration of SARS-CoV-2 viral copies this assay can detect is 250 copies / mL. A negative result does not preclude SARS-CoV-2 infection and should not be used as the sole basis for treatment or other patient management decisions.  A negative result may occur with improper specimen collection / handling, submission of specimen other than nasopharyngeal swab, presence of viral mutation(s) within the areas targeted by this assay, and inadequate number of viral copies (<250 copies / mL). A negative result must be combined with clinical observations, patient history, and epidemiological information.  Fact Sheet for Patients:   StrictlyIdeas.no  Fact Sheet for Healthcare Providers: BankingDealers.co.za  This test is not yet approved or  cleared by the  [place] Architectural technologist and has been authorized for detection and/or diagnosis  of SARS-CoV-2 by FDA under an Print production planner (EUA).  This EUA will remain in effect (meaning this test can be used) for the duration of the COVID-19 declaration under Section 564(b)(1) of the Act, 21 U.S.C. section 360bbb-3(b)(1), unless the authorization is terminated or revoked sooner.  Performed at [facility], [address] [place], [place] [address]   Surgical PCR screen     Status: None   Collection Time: 11/26/19  5:45 AM   Specimen: Nasal Mucosa; Nasal Swab  Result Value Ref Range Status   MRSA, PCR NEGATIVE NEGATIVE Final   Staphylococcus aureus NEGATIVE NEGATIVE Final    Comment: (NOTE) The Xpert SA Assay (FDA approved for NASAL specimens in patients 4 years of age and older), is one component of a comprehensive surveillance program. It is not intended to diagnose infection nor to guide or monitor treatment. Performed at [facility], [place] [address], [place], [place] [address]     Radiology Reports DG Chest 2 View  Result Date: 11/22/2019 CLINICAL DATA:  Encephalopathy.  Newly discovered brain mass. EXAM: CHEST - 2 VIEW COMPARISON:  None. FINDINGS: The heart size and mediastinal contours are within normal limits. Both lungs are clear. The visualized skeletal structures are unremarkable. IMPRESSION: No active cardiopulmonary disease. Electronically Signed   By: Ulyses Jarred M.D.   On: 11/22/2019 04:03   CT Head Wo Contrast  Result Date: 11/22/2019 CLINICAL DATA:  Altered level of consciousness for 4 days, dizziness EXAM: CT HEAD WITHOUT CONTRAST TECHNIQUE: Contiguous axial images were obtained from the base of the skull through the vertex without intravenous contrast. COMPARISON:  None. FINDINGS: Brain: There is significant vasogenic edema in the left frontal parietal region. 3.2 cm mass is seen within the left parietal white matter. MRI with and without contrast is recommended for further evaluation. Hypodensity extends along the posterior aspect of  the corpus callosum, either extension of the mass or edema. I do not see any definite hemorrhage. There is significant effacement of the left lateral ventricle, with rightward midline shift measuring up to 4 mm at the septum pellucidum. There is diffuse sulcal effacement throughout the left cerebral hemisphere, greatest along the convexity. No acute extra-axial fluid collections. Vascular: No hyperdense vessel or unexpected calcification. Skull: Normal. Negative for fracture or focal lesion. Sinuses/Orbits: Small metallic densities are seen within the globes bilaterally, likely related to prior glaucoma surgery. Please correlate with surgical history. Otherwise the orbits are unremarkable. Other: None. IMPRESSION: 1. 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 MRI with and without contrast is recommended. 2. Extensive vasogenic edema throughout the left cerebral hemisphere, with significant sulcal effacement and minimal rightward midline shift. Significant effacement of the left lateral ventricle. These results were called by telephone at the time of interpretation on 11/22/2019 at 12:34 am to provider IVA KNAPP , who verbally acknowledged these results. 1. Extensive cerebral edema Electronically Signed   By: Randa Ngo M.D.   On: 11/22/2019 00:34   CT CHEST W CONTRAST  Result Date: 11/23/2019 CLINICAL DATA:  69 y.o. female was brought to the ED due to confusion and gait imbalance. As per patient daughter, patient was having intermittent confusion. Patient denies chest pain, shortness of breath, nausea, vomiting or diarrhea. Mass seen on CT Head and Mri. EXAM: CT CHEST, ABDOMEN, AND PELVIS WITH CONTRAST TECHNIQUE: Multidetector  CT imaging of the chest, abdomen and pelvis was performed following the standard protocol during bolus administration of intravenous contrast. CONTRAST:  141mL  OMNIPAQUE IOHEXOL 300 MG/ML  SOLN COMPARISON:  Head CT and brain MRI from 11/22/2019 FINDINGS: CT CHEST FINDINGS Cardiovascular: Heart is normal in size and configuration. No pericardial effusion. No coronary artery calcifications. Great vessels are normal in caliber. No aortic atherosclerosis. Mediastinum/Nodes: No enlarged mediastinal, hilar, or axillary lymph nodes. Thyroid gland, trachea, and esophagus demonstrate no significant findings. Lungs/Pleura: Minor scarring at the base of the left upper lobe lingula. Minimal linear dependent lower lobe opacities consistent with atelectasis. Lungs otherwise clear with no masses or nodules. No pleural effusion or pneumothorax. Musculoskeletal: No fracture or acute finding. No osteoblastic or osteolytic lesions. CT ABDOMEN PELVIS FINDINGS Hepatobiliary: Liver normal in size and overall attenuation. Small low-density lesion between the anterior segment the right lobe and medial segment of the left, consistent with a cyst. Small amount of focal fat adjacent to the falciform ligament. No other abnormalities. Normal gallbladder. No bile duct dilation. Pancreas: Unremarkable. No pancreatic ductal dilatation or surrounding inflammatory changes. Spleen: Normal in size without focal abnormality. Adrenals/Urinary Tract: No adrenal masses. Kidneys normal in size, orientation and position with symmetric enhancement and excretion. Bilateral low-attenuation renal masses consistent with cysts, largest from the lower pole of the left kidney, 4.5 cm. No renal stones. No hydronephrosis. Normal ureters. Normal bladder. Stomach/Bowel: Small hiatal hernia. Stomach is otherwise within normal limits. Appendix appears normal. No evidence of bowel wall thickening, distention, or inflammatory changes. Vascular/Lymphatic: No significant vascular abnormality. No enlarged lymph nodes. Reproductive: Uterus and bilateral adnexa are unremarkable. Other: No abdominal wall hernia or abnormality. No  abdominopelvic ascites. Musculoskeletal: No fracture or acute finding. No osteoblastic or osteolytic lesions. IMPRESSION: 1. No acute findings within the chest, abdomen or pelvis. 2. No evidence of a primary malignancy within the chest, abdomen or pelvis. 3. Small low-density liver lesion consistent with a cyst. Bilateral renal cysts. Electronically Signed   By: Lajean Manes M.D.   On: 11/23/2019 14:38   MR BRAIN W WO CONTRAST  Result Date: 11/27/2019 CLINICAL DATA:  Postop day 1 following resection of a left parietal tumor (likely high-grade glioma). EXAM: MRI HEAD WITHOUT AND WITH CONTRAST TECHNIQUE: Multiplanar, multiecho pulse sequences of the brain and surrounding structures were obtained without and with intravenous contrast. CONTRAST:  34mL GADAVIST GADOBUTROL 1 MMOL/ML IV SOLN COMPARISON:  11/22/2019 FINDINGS: Brain: Sequelae of interval left parietal craniotomy and tumor resection are identified. There is a resection cavity in the left parietal lobe and splenium of the corpus callosum with a small amount of blood products. A small amount of restricted diffusion is noted along the margins of the resection cavity without a sizable acute infarct. There is a small amount of enhancement along the margins of the resection cavity which is largely curvilinear and likely postoperative. There is more nodular enhancement at the anterior aspect of the resection site at the level of the atrium of the left lateral ventricle which is felt to at least partially reflect choroid plexus. There is moderate nonenhancing T2 hyperintensity/edema in the white matter of the left parietal, occipital, and posterior temporal lobes which has decreased from the prior study. There is persistent nonenhancing T2 hyperintensity which crosses the splenium of the corpus callosum into the right periatrial white matter. There is trace residual rightward midline shift, decreased from prior. There is small volume pneumocephalus, and there is a  small extra-axial fluid collection subjacent to  the craniotomy. Mild dural thickening and enhancement over the posterior left cerebral convexity is likely postoperative. Vascular: Major intracranial vascular flow voids are preserved. Skull and upper cervical spine: Left parietal craniotomy with small volume fluid and gas in the scalp soft tissues. Sinuses/Orbits: Left cataract extraction. Paranasal sinuses and mastoid air cells are clear. Other: None. IMPRESSION: 1. Postoperative changes from interval left parietal and splenial tumor resection. Mild enhancement along the margins of the resection cavity as above. This will serve as a baseline for follow-up examinations. 2. Decreased surrounding T2 hyperintensity compatible with decreased edema and likely underlying nonenhancing tumor. Electronically Signed   By: Logan Bores M.D.   On: 11/27/2019 13:58   MR BRAIN W WO CONTRAST  Result Date: 11/23/2019 CLINICAL DATA:  Intracranial mass EXAM: MRI HEAD WITHOUT AND WITH CONTRAST TECHNIQUE: Multiplanar, multiecho pulse sequences of the brain and surrounding structures were obtained without and with intravenous contrast. CONTRAST:  30mL GADAVIST GADOBUTROL 1 MMOL/ML IV SOLN COMPARISON:  None. FINDINGS: Brain: There is a peripherally and heterogeneously contrast-enhancing mass within the left parietal white matter extending along the corpus callosum splenium. Mass measures 4.8 x 3.3 x 3.0 cm. There is moderate surrounding hyperintense T2-weighted signal a crosses into the right hemisphere. Rightward midline shift measures 3 mm. There is petechial hemorrhage within the mass. Vascular: Normal flow voids. Skull and upper cervical spine: Normal marrow signal. Sinuses/Orbits: Negative. Other: None. IMPRESSION: 1. Heterogeneously contrast-enhancing mass within the left parietal white matter extending along the corpus callosum splenium, most consistent with high-grade glioma. 2. Moderate surrounding hyperintense T2-weighted  signal, likely a combination of edema and nonenhancing tumor. 3. 3 mm of rightward midline shift. 4. Intratumoral petechial hemorrhage. Electronically Signed   By: Ulyses Jarred M.D.   On: 11/23/2019 00:09   [place] Intraoperative  Result Date: 11/26/2019 CLINICAL DATA:  Ultrasound was provided for use by the ordering physician, and a technical charge was applied by the performing facility.  No radiologist interpretation/professional services rendered.   CT ABDOMEN PELVIS W CONTRAST  Result Date: 11/23/2019 CLINICAL DATA:  69 y.o. female was brought to the ED due to confusion and gait imbalance. As per patient daughter, patient was having intermittent confusion. Patient denies chest pain, shortness of breath, nausea, vomiting or diarrhea. Mass seen on CT Head and Mri. EXAM: CT CHEST, ABDOMEN, AND PELVIS WITH CONTRAST TECHNIQUE: Multidetector CT imaging of the chest, abdomen and pelvis was performed following the standard protocol during bolus administration of intravenous contrast. CONTRAST:  135mL OMNIPAQUE IOHEXOL 300 MG/ML  SOLN COMPARISON:  Head CT and brain MRI from 11/22/2019 FINDINGS: CT CHEST FINDINGS Cardiovascular: Heart is normal in size and configuration. No pericardial effusion. No coronary artery calcifications. Great vessels are normal in caliber. No aortic atherosclerosis. Mediastinum/Nodes: No enlarged mediastinal, hilar, or axillary lymph nodes. Thyroid gland, trachea, and esophagus demonstrate no significant findings. Lungs/Pleura: Minor scarring at the base of the left upper lobe lingula. Minimal linear dependent lower lobe opacities consistent with atelectasis. Lungs otherwise clear with no masses or nodules. No pleural effusion or pneumothorax. Musculoskeletal: No fracture or acute finding. No osteoblastic or osteolytic lesions. CT ABDOMEN PELVIS FINDINGS Hepatobiliary: Liver normal in size and overall attenuation. Small low-density lesion between the anterior segment the right lobe and  medial segment of the left, consistent with a cyst. Small amount of focal fat adjacent to the falciform ligament. No other abnormalities. Normal gallbladder. No bile duct dilation. Pancreas: Unremarkable. No pancreatic ductal dilatation or surrounding inflammatory changes. Spleen: Normal in size  without focal abnormality. Adrenals/Urinary Tract: No adrenal masses. Kidneys normal in size, orientation and position with symmetric enhancement and excretion. Bilateral low-attenuation renal masses consistent with cysts, largest from the lower pole of the left kidney, 4.5 cm. No renal stones. No hydronephrosis. Normal ureters. Normal bladder. Stomach/Bowel: Small hiatal hernia. Stomach is otherwise within normal limits. Appendix appears normal. No evidence of bowel wall thickening, distention, or inflammatory changes. Vascular/Lymphatic: No significant vascular abnormality. No enlarged lymph nodes. Reproductive: Uterus and bilateral adnexa are unremarkable. Other: No abdominal wall hernia or abnormality. No abdominopelvic ascites. Musculoskeletal: No fracture or acute finding. No osteoblastic or osteolytic lesions. IMPRESSION: 1. No acute findings within the chest, abdomen or pelvis. 2. No evidence of a primary malignancy within the chest, abdomen or pelvis. 3. Small low-density liver lesion consistent with a cyst. Bilateral renal cysts. Electronically Signed   By: Lajean Manes M.D.   On: 11/23/2019 14:38    Lab Data:  CBC: Recent Labs  Lab 11/23/19 0837 11/23/19 0837 11/24/19 0306 11/26/19 0445 11/27/19 0732 11/28/19 0414 11/29/19 0333  WBC 15.6*   < > 13.9* 11.0* 20.7* 17.4* 12.9*  NEUTROABS 14.1*  --  12.2* 9.7* 19.1*  --   --   HGB 15.2*   < > 13.6 14.4 13.6 13.2 13.4  HCT 44.3   < > 40.2 41.8 39.7 38.9 39.8  MCV 84.1   < > 84.3 82.6 84.1 84.6 83.4  PLT 454*   < > 382 390 351 357 347   < > = values in this interval not displayed.   Basic Metabolic Panel: Recent Labs  Lab 11/23/19 0837  11/24/19 0306 11/25/19 0312 11/25/19 0719 11/26/19 0445 11/27/19 0732 11/28/19 0414 11/29/19 0333  NA 140   < > 139  --  139 137 140 139  K 3.9   < > 4.1  --  4.1 3.6 3.9 3.9  CL 103   < > 106  --  106 105 105 105  CO2 24   < > 25  --  24 22 26 25   GLUCOSE 123*   < > 127*  --  138* 179* 137* 144*  BUN 14   < > 14  --  16 12 14 19   CREATININE 0.79   < > 0.70  --  0.76 0.74 0.69 0.67  CALCIUM 8.7*   < > 8.6*  --  8.9 7.6* 8.1* 8.3*  MG 2.1  --   --  2.1 2.0  --   --   --    < > = values in this interval not displayed.   GFR: Estimated Creatinine Clearance: 65.1 mL/min (by C-G formula based on SCr of 0.67 mg/dL). Liver Function Tests: Recent Labs  Lab 11/23/19 0837 11/26/19 0445  AST 24 15  ALT 29 22  ALKPHOS 53 45  BILITOT 0.4 0.7  PROT 7.1 6.0*  ALBUMIN 3.8 3.3*   No results for input(s): LIPASE, AMYLASE in the last 168 hours. No results for input(s): AMMONIA in the last 168 hours. Coagulation Profile: Recent Labs  Lab 11/25/19 0719 11/26/19 0445  INR 1.1 1.2   Cardiac Enzymes: No results for input(s): CKTOTAL, CKMB, CKMBINDEX, TROPONINI in the last 168 hours. BNP (last 3 results) No results for input(s): PROBNP in the last 8760 hours. HbA1C: No results for input(s): HGBA1C in the last 72 hours. CBG: No results for input(s): GLUCAP in the last 168 hours. Lipid Profile: No results for input(s): CHOL, HDL, LDLCALC, TRIG, CHOLHDL, LDLDIRECT in  the last 72 hours. Thyroid Function Tests: No results for input(s): TSH, T4TOTAL, FREET4, T3FREE, THYROIDAB in the last 72 hours. Anemia Panel: No results for input(s): VITAMINB12, FOLATE, FERRITIN, TIBC, IRON, RETICCTPCT in the last 72 hours. Urine analysis:    Component Value Date/Time   COLORURINE YELLOW 11/21/2019 2248   APPEARANCEUR HAZY (A) 11/21/2019 2248   LABSPEC 1.016 11/21/2019 2248   PHURINE 5.0 11/21/2019 2248   GLUCOSEU NEGATIVE 11/21/2019 2248   HGBUR MODERATE (A) 11/21/2019 2248   BILIRUBINUR NEGATIVE  11/21/2019 2248   KETONESUR NEGATIVE 11/21/2019 2248   PROTEINUR NEGATIVE 11/21/2019 2248   NITRITE NEGATIVE 11/21/2019 2248   LEUKOCYTESUR LARGE (A) 11/21/2019 2248     Ciel Chervenak M.D. [facility] 11/29/2019, 10:36 AM   Call night coverage person covering after 7pm

## 2019-11-29 NOTE — Progress Notes (Signed)
Neurosurgery Service Progress Note  Subjective: No acute events overnight, no new complaints   Objective: Vitals:   11/29/19 1107 11/29/19 1506 11/29/19 1548 11/29/19 1632  BP: 127/74 (!) 141/65 134/84   Pulse: 70 68 67 76  Resp: 16 16  18   Temp: 97.7 F (36.5 C) 98.1 F (36.7 C)  97.6 F (36.4 C)  TempSrc: Oral Oral  Oral  SpO2: 100% 100% 98% 100%  Weight:      Height:       Temp (24hrs), Avg:98 F (36.7 C), Min:97.6 F (36.4 C), Max:98.5 F (36.9 C)  CBC Latest Ref Rng & Units 11/29/2019 11/28/2019 11/27/2019  WBC 4.0 - 10.5 K/uL 12.9(H) 17.4(H) 20.7(H)  Hemoglobin 12.0 - 15.0 g/dL 13.4 13.2 13.6  Hematocrit 36 - 46 % 39.8 38.9 39.7  Platelets 150 - 400 K/uL 347 357 351   BMP Latest Ref Rng & Units 11/29/2019 11/28/2019 11/27/2019  Glucose 70 - 99 mg/dL 144(H) 137(H) 179(H)  BUN 8 - 23 mg/dL 19 14 12   Creatinine 0.44 - 1.00 mg/dL 0.67 0.69 0.74  BUN/Creat Ratio 6 - 22 (calc) - - -  Sodium 135 - 145 mmol/L 139 140 137  Potassium 3.5 - 5.1 mmol/L 3.9 3.9 3.6  Chloride 98 - 111 mmol/L 105 105 105  CO2 22 - 32 mmol/L 25 26 22   Calcium 8.9 - 10.3 mg/dL 8.3(L) 8.1(L) 7.6(L)    Intake/Output Summary (Last 24 hours) at 11/29/2019 1632 Last data filed at 11/29/2019 0830 Gross per 24 hour  Intake 240 ml  Output --  Net 240 ml    Current Facility-Administered Medications:  .  acetaminophen (TYLENOL) tablet 650 mg, 650 mg, Oral, Q4H PRN, 650 mg at 11/27/19 1743 **OR** acetaminophen (TYLENOL) suppository 650 mg, 650 mg, Rectal, Q4H PRN, [name], MD .  amLODipine (NORVASC) tablet 5 mg, 5 mg, Oral, Daily, [name], MD, 5 mg at 11/29/19 1012 .  Chlorhexidine Gluconate Cloth 2 % PADS 6 each, 6 each, Topical, Daily, [name], MD, 6 each at 11/28/19 1600 .  cloNIDine (CATAPRES) tablet 0.3 mg, 0.3 mg, Oral, QHS, [name], MD, 0.3 mg at 11/28/19 2211 .  [COMPLETED] dexamethasone (DECADRON) tablet 4 mg, 4 mg, Oral, Q6H, 4 mg at 11/29/19 0542 **FOLLOWED  BY** dexamethasone (DECADRON) tablet 4 mg, 4 mg, Oral, Q8H, 4 mg at 11/29/19 1409 **FOLLOWED BY** [START ON 12/01/2019] dexamethasone (DECADRON) tablet 2 mg, 2 mg, Oral, Q8H **FOLLOWED BY** [START ON 12/03/2019] dexamethasone (DECADRON) tablet 2 mg, 2 mg, Oral, Q12H **FOLLOWED BY** [START ON 12/05/2019] dexamethasone (DECADRON) tablet 1 mg, 1 mg, Oral, Q12H **FOLLOWED BY** [START ON 12/07/2019] dexamethasone (DECADRON) tablet 1 mg, 1 mg, Oral, Daily, [name], MD .  docusate sodium (COLACE) capsule 100 mg, 100 mg, Oral, BID, [name], MD, 100 mg at 11/29/19 1012 .  dorzolamide-timolol (COSOPT) 22.3-6.8 MG/ML ophthalmic solution 1 drop, 1 drop, Right Eye, BID, [name], MD, 1 drop at 11/29/19 1200 .  enalaprilat (VASOTEC) injection 1.25 mg, 1.25 mg, Intravenous, Q6H PRN, [name], MD, 1.25 mg at 11/26/19 1750 .  enoxaparin (LOVENOX) injection 40 mg, 40 mg, Subcutaneous, Q24H, [name], MD, 40 mg at 11/29/19 1013 .  HYDROcodone-acetaminophen (NORCO/VICODIN) 5-325 MG per tablet 1 tablet, 1 tablet, Oral, Q4H PRN, [name], MD, 1 tablet at 11/28/19 1235 .  labetalol (NORMODYNE) injection 10 mg, 10 mg, Intravenous, Q10 min PRN, [name], MD, 10 mg at 11/27/19 1614 .  levETIRAcetam (KEPPRA) tablet 500 mg, 500 mg, Oral, BID, [name], MD, 500 mg at 11/29/19 1012 .  morphine 2 MG/ML injection 1-2 mg, 1-2 mg, Intravenous, Q2H PRN, [name], MD, 2 mg at 11/26/19 1627 .  ondansetron (ZOFRAN) tablet 4 mg, 4 mg, Oral, Q4H PRN **OR** ondansetron (ZOFRAN) injection 4 mg, 4 mg, Intravenous, Q4H PRN, [name], MD .  pantoprazole (PROTONIX) EC tablet 40 mg, 40 mg, Oral, Daily, [name], MD, 40 mg at 11/29/19 1012 .  polyethylene glycol (MIRALAX / GLYCOLAX) packet 17 g, 17 g, Oral, Daily PRN, [name], MD .  pravastatin (PRAVACHOL) tablet 10 mg, 10 mg, Oral, q1800, [name], MD, 10 mg at 11/28/19  1817 .  promethazine (PHENERGAN) tablet 12.5-25 mg, 12.5-25 mg, Oral, Q4H PRN, [name], MD .  senna [facility]) tablet 8.6 mg, 1 tablet, Oral, BID, [name], MD, 8.6 mg at 11/29/19 1012 .  sodium phosphate (FLEET) 7-19 GM/118ML enema 1 enema, 1 enema, Rectal, Once PRN, [name], MD .  vitamin B-12 (CYANOCOBALAMIN) tablet 2,500 mcg, 2,500 mcg, Oral, Daily, [name], MD, 2,500 mcg at 11/29/19 1201   Physical Exam: Awake/alert, R HH, strength 5/5x4, FCx4, +RUE drift  Assessment & Plan: 69 y.o. woman s/p crani for tumor resection, recovering well.  -no change in neurosurgical plan of care, CIR pending  Judith Part  11/29/19 4:32 PM

## 2019-11-29 NOTE — Progress Notes (Signed)
Pt has a transfer bed on 3W Rm 13. RN will call back for report. All belongings together. Pt alert and oriented OOB in chair. Son visited earlier. Denies complaints. Staples intact in Left head incision. MEWS [place] at 1506. Afebrile. Vital signs stable. Last BP 134/84. HR 67, SpO2 98 on RA. Simmie Davies RN

## 2019-11-30 LAB — BASIC METABOLIC PANEL
Anion gap: 10 (ref 5–15)
BUN: 19 mg/dL (ref 8–23)
CO2: 25 mmol/L (ref 22–32)
Calcium: 8.4 mg/dL — ABNORMAL LOW (ref 8.9–10.3)
Chloride: 105 mmol/L (ref 98–111)
Creatinine, Ser: 0.72 mg/dL (ref 0.44–1.00)
GFR calc Af Amer: >60 mL/min (ref >60)
GFR calc non Af Amer: >60 mL/min (ref >60)
Glucose, Bld: 157 mg/dL — ABNORMAL HIGH (ref 70–99)
Potassium: 3.5 mmol/L (ref 3.5–5.1)
Sodium: 140 mmol/L (ref 135–145)

## 2019-11-30 LAB — CBC
HCT: 41.1 % (ref 36.0–46.0)
Hemoglobin: 13.9 g/dL (ref 12.0–15.0)
MCH: 28.6 pg (ref 26.0–34.0)
MCHC: 33.8 g/dL (ref 30.0–36.0)
MCV: 84.6 fL (ref 80.0–100.0)
Platelets: 343 10*3/uL (ref 150–400)
RBC: 4.86 MIL/uL (ref 3.87–5.11)
RDW: 13.2 % (ref 11.5–15.5)
WBC: 12.1 10*3/uL — ABNORMAL HIGH (ref 4.0–10.5)
nRBC: 0 % (ref 0.0–0.2)

## 2019-11-30 NOTE — Progress Notes (Signed)
Subjective: Patient reports no headaches Objective: Vital signs in last 24 hours: Temp:  [97.6 F (36.4 C)-98.8 F (37.1 C)] (P) 98.8 F (37.1 C) (07/04 0730) Pulse Rate:  [57-79] (P) 57 (07/04 0730) Resp:  [16-18] (P) 18 (07/04 0730) BP: (134-164)/(65-84) (P) 145/83 (07/04 0730) SpO2:  [98 %-100 %] (P) 98 % (07/04 0730)  Intake/Output from previous day: 07/03 0701 - 07/04 0700 In: 240 [P.O.:240] Out: -  Intake/Output this shift: Total I/O In: 240 [P.O.:240] Out: -   Cognitive slowing, FC x 4, awake and alert Incision c/d  Lab Results: Recent Labs    11/29/19 0333 11/30/19 0045  WBC 12.9* 12.1*  HGB 13.4 13.9  HCT 39.8 41.1  PLT 347 343   BMET Recent Labs    11/29/19 0333 11/30/19 0045  NA 139 140  K 3.9 3.5  CL 105 105  CO2 25 25  GLUCOSE 144* 157*  BUN 19 19  CREATININE 0.67 0.72  CALCIUM 8.3* 8.4*    Studies/Results: No results found.  Assessment/Plan: S/p crani for GBM - awaiting rehab - cont dex taper - lovenox for dvt ppx  [name] 11/30/2019, 11:36 AM

## 2019-11-30 NOTE — Progress Notes (Signed)
[facility]                                                                              Patient Demographics  Wanda Ross, is a 69 y.o. female, DOB - 10-04-1950, GYI:[id number]  Admit date - 11/21/2019   Admitting Physician Oswald Hillock, MD  Outpatient Primary MD for the patient is Fayrene Helper, MD  Outpatient specialists:   LOS - 8  days   Medical records reviewed and are as summarized below:    Chief Complaint  Patient presents with  . Altered Mental Status       Brief summary   Per HPI: [name] a63 y.o.female,with history of glaucoma, hyperlipidemia, hypertension was brought to the ED due to confusion and gait imbalance. As per patient daughter, patient was having intermittent confusion.Patient denies chest pain, shortness of breath, nausea, vomiting or diarrhea. Denies blurred vision. No speech disturbance. Patient complained of headache, poor appetite for past 2 weeks. 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 Denies abdominal pain or dysuria, Denies weight loss. No previous history of malignancy In the ED CT of the head was obtained which showed ill-defined 3.2 cm mass within the left parietal white matter, compatible malignancy. 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 Extensive vasogenic edema noted throughout the left cerebral hemisphere with significant sulcal effacement and minimal rightward midline shift. Neurosurgeon Dr. Marcello Moores was consulted by ED physician who recommended Decadron, MRI brain with and without contrast and transfer to [place] disposition, pending insurance approval for CIR   Assessment & Plan    Brain mass/glioma-new diagnosis -CT head showed brain mass with vasogenic edema throughout the left cerebral hemisphere with minimal rightward midline shift -Patient was started on Decadron, no acute neurological deficits were noted, neurosurgery was consulted. -CT chest abdomen and pelvis showed  no acute findings within the chest abdomen and pelvis, no evidence of primary malignancy.  Baseline EKG reviewed, no acute ST-T wave changes. -Patient underwent OR on 6/30, status post left craniotomy for resection of parietal and splenial mass -Per pathology, Dr. 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  -d/w Neurosurgery, Dr Marcello Moores on 7/2, have discussed with Dr. Mickeal Skinner and oincology, plan for possible XRT in 2-3 weeks and chemo, will be determined by the biopsy results.  -Postop day #4, no acute issues, no headache, dizziness or neuro deficits.  Cognitively improving. -No acute issues, awaiting CIR  Abnormal UA -Urine culture showed 60,000 colonies staph epidermidis -Hold off on antibiotics, no fevers, alert and oriented  Essential hypertension -Currently stable  Hyperlipidemia Continue pravastatin   Code Status: Full DVT Prophylaxis:  SCD's Family Communication: Discussed all imaging results, lab results, explained to the patient.    Disposition Plan:     Status is: Inpatient  Remains inpatient appropriate because:Inpatient level of care appropriate due to severity of illness   Dispo: The patient is from: Home              Anticipated d/c is to: CIR              Anticipated d/c date is: 1 day  Patient currently is medically stable to d/c. CIR pending insurance approval       Time Spent in minutes 25 minutes  Procedures:  Procedure: 1.  Left craniotomy for resection parietal and splenial mass 2.  Use of computer-assisted neuro navigation   Consultants:   Neurosurgery  Antimicrobials:   Anti-infectives (From admission, onward)   Start     Dose/Rate Route Frequency Ordered Stop   11/26/19 1600  ceFAZolin (ANCEF) IVPB 1 g/50 mL premix        1 g 100 mL/hr over 30 Minutes Intravenous Every 8 hours 11/26/19 1404 11/27/19 0838   11/26/19 0600  ceFAZolin (ANCEF) IVPB 2g/100 mL premix         2 g 200 mL/hr over 30 Minutes Intravenous To Short Stay 11/25/19 1804 11/26/19 0810   11/24/19 1530  clindamycin (CLEOCIN) capsule 300 mg  Status:  Discontinued        300 mg Oral 2 times daily 11/24/19 1518 11/25/19 0929   11/23/19 1400  ampicillin-sulbactam (UNASYN) 1.5 g in sodium chloride 0.9 % 100 mL IVPB  Status:  Discontinued        1.5 g 200 mL/hr over 30 Minutes Intravenous Every 6 hours 11/23/19 1325 11/24/19 1518   11/22/19 0800  cefTRIAXone (ROCEPHIN) 1 g in sodium chloride 0.9 % 100 mL IVPB  Status:  Discontinued        1 g 200 mL/hr over 30 Minutes Intravenous Every 24 hours 11/22/19 0320 11/22/19 0444   11/22/19 0445  cefTRIAXone (ROCEPHIN) 1 g in sodium chloride 0.9 % 100 mL IVPB  Status:  Discontinued        1 g 200 mL/hr over 30 Minutes Intravenous Every 24 hours 11/22/19 0444 11/23/19 1325   11/21/19 2345  cephALEXin (KEFLEX) capsule 500 mg        500 mg Oral  Once 11/21/19 2330 11/22/19 0034         Medications  Scheduled Meds: . amLODipine  5 mg Oral Daily  . Chlorhexidine Gluconate Cloth  6 each Topical Daily  . cloNIDine  0.3 mg Oral QHS  . dexamethasone  4 mg Oral Q8H   Followed by  . [START ON 12/01/2019] dexamethasone  2 mg Oral Q8H   Followed by  . [START ON 12/03/2019] dexamethasone  2 mg Oral Q12H   Followed by  . [START ON 12/05/2019] dexamethasone  1 mg Oral Q12H   Followed by  . [START ON 12/07/2019] dexamethasone  1 mg Oral Daily  . docusate sodium  100 mg Oral BID  . dorzolamide-timolol  1 drop Right Eye BID  . enoxaparin (LOVENOX) injection  40 mg Subcutaneous Q24H  . levETIRAcetam  500 mg Oral BID  . pantoprazole  40 mg Oral Daily  . pravastatin  10 mg Oral q1800  . senna  1 tablet Oral BID  . vitamin B-12  2,500 mcg Oral Daily   Continuous Infusions:  PRN Meds:.acetaminophen **OR** acetaminophen, enalaprilat, HYDROcodone-acetaminophen, labetalol, morphine injection, ondansetron **OR** ondansetron (ZOFRAN) IV, polyethylene glycol,  promethazine, sodium phosphate      Subjective:   Wanda Ross was seen and examined today.  No complaints, awaiting CIR.  No acute neurological deficits, headaches or visual blurring.  No acute events overnight.  No chest pain, shortness of breath, nausea vomiting or abdominal pain.   Objective:   Vitals:   11/29/19 1923 11/30/19 0016 11/30/19 0318 11/30/19 0730  BP: (!) 152/71 136/73 (!) 154/74 (!) (P) 145/83  Pulse: 79 70 63 (!) (  P) 57  Resp: 18 18 18  (P) 18  Temp: 97.8 F (36.6 C) 98.4 F (36.9 C) 98 F (36.7 C) (P) 98.8 F (37.1 C)  TempSrc: Oral Oral Oral (P) Oral  SpO2: 99% 99% 100% (P) 98%  Weight:      Height:        Intake/Output Summary (Last 24 hours) at 11/30/2019 1112 Last data filed at 11/30/2019 1005 Gross per 24 hour  Intake 240 ml  Output --  Net 240 ml     Wt Readings from Last 3 Encounters:  11/21/19 68.9 kg  10/22/19 68.9 kg  06/04/19 68.9 kg   Physical Exam  General: Alert and oriented x 3, NAD  Cardiovascular: S1 S2 clear, RRR. No pedal edema b/l  Respiratory: CTAB, no wheezing, rales or rhonchi  Gastrointestinal: Soft, nontender, nondistended, NBS  Ext: no pedal edema bilaterally  Neuro: no new deficits  Musculoskeletal: No cyanosis, clubbing  Skin: No rashes  Psych: Normal affect and demeanor, alert and oriented x3     Data Reviewed:  I have personally reviewed following labs and imaging studies  Micro Results Recent Results (from the past 240 hour(s))  Urine culture     Status: Abnormal   Collection Time: 11/21/19 10:48 PM   Specimen: Urine, Clean Catch  Result Value Ref Range Status   Specimen Description   Final    URINE, CLEAN CATCH Performed at [facility], [address] [place], [place] [address]    Special Requests   Final    NONE Performed at [facility], [address] [place], [place] [id number]    Culture (A)  Final    60,000 COLONIES/mL GROUP B STREP(S.AGALACTIAE)ISOLATED TESTING AGAINST S.  AGALACTIAE NOT ROUTINELY PERFORMED DUE TO PREDICTABILITY OF AMP/PEN/VAN SUSCEPTIBILITY. 30,000 COLONIES/mL STAPHYLOCOCCUS EPIDERMIDIS    Report Status 11/24/2019 FINAL  Final   Organism ID, Bacteria STAPHYLOCOCCUS EPIDERMIDIS (A)  Final      Susceptibility   Staphylococcus epidermidis - MIC*    CIPROFLOXACIN <=0.5 SENSITIVE Sensitive     GENTAMICIN <=0.5 SENSITIVE Sensitive     NITROFURANTOIN <=16 SENSITIVE Sensitive     OXACILLIN >=4 RESISTANT Resistant     TETRACYCLINE <=1 SENSITIVE Sensitive     VANCOMYCIN 2 SENSITIVE Sensitive     TRIMETH/SULFA <=10 SENSITIVE Sensitive     CLINDAMYCIN <=0.25 SENSITIVE Sensitive     RIFAMPIN <=0.5 SENSITIVE Sensitive     Inducible Clindamycin NEGATIVE Sensitive     * 30,000 COLONIES/mL STAPHYLOCOCCUS EPIDERMIDIS  SARS Coronavirus 2 by RT PCR (hospital order, performed in [place] hospital lab) Nasopharyngeal Nasopharyngeal Swab     Status: None   Collection Time: 11/22/19 12:41 AM   Specimen: Nasopharyngeal Swab  Result Value Ref Range Status   SARS Coronavirus 2 NEGATIVE NEGATIVE Final    Comment: (NOTE) SARS-CoV-2 target nucleic acids are NOT DETECTED.  The SARS-CoV-2 RNA is generally detectable in upper and lower respiratory specimens during the acute phase of infection. The lowest concentration of SARS-CoV-2 viral copies this assay can detect is 250 copies / mL. A negative result does not preclude SARS-CoV-2 infection and should not be used as the sole basis for treatment or other patient management decisions.  A negative result may occur with improper specimen collection / handling, submission of specimen other than nasopharyngeal swab, presence of viral mutation(s) within the areas targeted by this assay, and inadequate number of viral copies (<250 copies / mL). A negative result must be combined with clinical observations, patient history, and  epidemiological information.  Fact Sheet for Patients:    StrictlyIdeas.no  Fact Sheet for Healthcare Providers: BankingDealers.co.za  This test is not yet approved or  cleared by the [place] FDA and has been authorized for detection and/or diagnosis of SARS-CoV-2 by FDA under an Emergency Use Authorization (EUA).  This EUA will remain in effect (meaning this test can be used) for the duration of the COVID-19 declaration under Section 564(b)(1) of the Act, 21 U.S.C. section 360bbb-3(b)(1), unless the authorization is terminated or revoked sooner.  Performed at [facility], [address], [place], [place] [address]   Surgical PCR screen     Status: None   Collection Time: 11/26/19  5:45 AM   Specimen: Nasal Mucosa; Nasal Swab  Result Value Ref Range Status   MRSA, PCR NEGATIVE NEGATIVE Final   Staphylococcus aureus NEGATIVE NEGATIVE Final    Comment: (NOTE) The Xpert SA Assay (FDA approved for NASAL specimens in patients 13 years of age and older), is one component of a comprehensive surveillance program. It is not intended to diagnose infection nor to guide or monitor treatment. Performed at [facility], [place] [address] [place], [place] [address]     Radiology Reports DG Chest 2 View  Result Date: 11/22/2019 CLINICAL DATA:  Encephalopathy.  Newly discovered brain mass. EXAM: CHEST - 2 VIEW COMPARISON:  None. FINDINGS: The heart size and mediastinal contours are within normal limits. Both lungs are clear. The visualized skeletal structures are unremarkable. IMPRESSION: No active cardiopulmonary disease. Electronically Signed   By: Ulyses Jarred M.D.   On: 11/22/2019 04:03   CT Head Wo Contrast  Result Date: 11/22/2019 CLINICAL DATA:  Altered level of consciousness for 4 days, dizziness EXAM: CT HEAD WITHOUT CONTRAST TECHNIQUE: Contiguous axial images were obtained from the base of the skull through the vertex without intravenous contrast. COMPARISON:  None. FINDINGS: Brain:  There is significant vasogenic edema in the left frontal parietal region. 3.2 cm mass is seen within the left parietal white matter. MRI with and without contrast is recommended for further evaluation. Hypodensity extends along the posterior aspect of the corpus callosum, either extension of the mass or edema. I do not see any definite hemorrhage. There is significant effacement of the left lateral ventricle, with rightward midline shift measuring up to 4 mm at the septum pellucidum. There is diffuse sulcal effacement throughout the left cerebral hemisphere, greatest along the convexity. No acute extra-axial fluid collections. Vascular: No hyperdense vessel or unexpected calcification. Skull: Normal. Negative for fracture or focal lesion. Sinuses/Orbits: Small metallic densities are seen within the globes bilaterally, likely related to prior glaucoma surgery. Please correlate with surgical history. Otherwise the orbits are unremarkable. Other: None. IMPRESSION: 1. 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 MRI with and without contrast is recommended. 2. Extensive vasogenic edema throughout the left cerebral hemisphere, with significant sulcal effacement and minimal rightward midline shift. Significant effacement of the left lateral ventricle. These results were called by telephone at the time of interpretation on 11/22/2019 at 12:34 am to provider IVA KNAPP , who verbally acknowledged these results. 1. Extensive cerebral edema Electronically Signed   By: Randa Ngo M.D.   On: 11/22/2019 00:34   CT CHEST W CONTRAST  Result Date: 11/23/2019 CLINICAL DATA:  69 y.o. female was brought to the ED due to confusion and gait imbalance. As per patient daughter, patient was having intermittent  confusion. Patient denies chest pain, shortness of breath, nausea, vomiting or diarrhea. Mass seen on CT Head  and Mri. EXAM: CT CHEST, ABDOMEN, AND PELVIS WITH CONTRAST TECHNIQUE: Multidetector CT imaging of the chest, abdomen and pelvis was performed following the standard protocol during bolus administration of intravenous contrast. CONTRAST:  142mL OMNIPAQUE IOHEXOL 300 MG/ML  SOLN COMPARISON:  Head CT and brain MRI from 11/22/2019 FINDINGS: CT CHEST FINDINGS Cardiovascular: Heart is normal in size and configuration. No pericardial effusion. No coronary artery calcifications. Great vessels are normal in caliber. No aortic atherosclerosis. Mediastinum/Nodes: No enlarged mediastinal, hilar, or axillary lymph nodes. Thyroid gland, trachea, and esophagus demonstrate no significant findings. Lungs/Pleura: Minor scarring at the base of the left upper lobe lingula. Minimal linear dependent lower lobe opacities consistent with atelectasis. Lungs otherwise clear with no masses or nodules. No pleural effusion or pneumothorax. Musculoskeletal: No fracture or acute finding. No osteoblastic or osteolytic lesions. CT ABDOMEN PELVIS FINDINGS Hepatobiliary: Liver normal in size and overall attenuation. Small low-density lesion between the anterior segment the right lobe and medial segment of the left, consistent with a cyst. Small amount of focal fat adjacent to the falciform ligament. No other abnormalities. Normal gallbladder. No bile duct dilation. Pancreas: Unremarkable. No pancreatic ductal dilatation or surrounding inflammatory changes. Spleen: Normal in size without focal abnormality. Adrenals/Urinary Tract: No adrenal masses. Kidneys normal in size, orientation and position with symmetric enhancement and excretion. Bilateral low-attenuation renal masses consistent with cysts, largest from the lower pole of the left kidney, 4.5 cm. No renal stones. No hydronephrosis. Normal ureters. Normal bladder. Stomach/Bowel: Small hiatal hernia. Stomach is otherwise within normal limits. Appendix appears normal. No evidence of bowel wall  thickening, distention, or inflammatory changes. Vascular/Lymphatic: No significant vascular abnormality. No enlarged lymph nodes. Reproductive: Uterus and bilateral adnexa are unremarkable. Other: No abdominal wall hernia or abnormality. No abdominopelvic ascites. Musculoskeletal: No fracture or acute finding. No osteoblastic or osteolytic lesions. IMPRESSION: 1. No acute findings within the chest, abdomen or pelvis. 2. No evidence of a primary malignancy within the chest, abdomen or pelvis. 3. Small low-density liver lesion consistent with a cyst. Bilateral renal cysts. Electronically Signed   By: Lajean Manes M.D.   On: 11/23/2019 14:38   MR BRAIN W WO CONTRAST  Result Date: 11/27/2019 CLINICAL DATA:  Postop day 1 following resection of a left parietal tumor (likely high-grade glioma). EXAM: MRI HEAD WITHOUT AND WITH CONTRAST TECHNIQUE: Multiplanar, multiecho pulse sequences of the brain and surrounding structures were obtained without and with intravenous contrast. CONTRAST:  39mL GADAVIST GADOBUTROL 1 MMOL/ML IV SOLN COMPARISON:  11/22/2019 FINDINGS: Brain: Sequelae of interval left parietal craniotomy and tumor resection are identified. There is a resection cavity in the left parietal lobe and splenium of the corpus callosum with a small amount of blood products. A small amount of restricted diffusion is noted along the margins of the resection cavity without a sizable acute infarct. There is a small amount of enhancement along the margins of the resection cavity which is largely curvilinear and likely postoperative. There is more nodular enhancement at the anterior aspect of the resection site at the level of the atrium of the left lateral ventricle which is felt to at least partially reflect choroid plexus. There is moderate nonenhancing T2 hyperintensity/edema in the white matter of the left parietal, occipital, and posterior temporal lobes which has decreased from the prior study. There is persistent  nonenhancing T2 hyperintensity which crosses the splenium of the corpus callosum into the  right periatrial white matter. There is trace residual rightward midline shift, decreased from prior. There is small volume pneumocephalus, and there is a small extra-axial fluid collection subjacent to the craniotomy. Mild dural thickening and enhancement over the posterior left cerebral convexity is likely postoperative. Vascular: Major intracranial vascular flow voids are preserved. Skull and upper cervical spine: Left parietal craniotomy with small volume fluid and gas in the scalp soft tissues. Sinuses/Orbits: Left cataract extraction. Paranasal sinuses and mastoid air cells are clear. Other: None. IMPRESSION: 1. Postoperative changes from interval left parietal and splenial tumor resection. Mild enhancement along the margins of the resection cavity as above. This will serve as a baseline for follow-up examinations. 2. Decreased surrounding T2 hyperintensity compatible with decreased edema and likely underlying nonenhancing tumor. Electronically Signed   By: Logan Bores M.D.   On: 11/27/2019 13:58   MR BRAIN W WO CONTRAST  Result Date: 11/23/2019 CLINICAL DATA:  Intracranial mass EXAM: MRI HEAD WITHOUT AND WITH CONTRAST TECHNIQUE: Multiplanar, multiecho pulse sequences of the brain and surrounding structures were obtained without and with intravenous contrast. CONTRAST:  14mL GADAVIST GADOBUTROL 1 MMOL/ML IV SOLN COMPARISON:  None. FINDINGS: Brain: There is a peripherally and heterogeneously contrast-enhancing mass within the left parietal white matter extending along the corpus callosum splenium. Mass measures 4.8 x 3.3 x 3.0 cm. There is moderate surrounding hyperintense T2-weighted signal a crosses into the right hemisphere. Rightward midline shift measures 3 mm. There is petechial hemorrhage within the mass. Vascular: Normal flow voids. Skull and upper cervical spine: Normal marrow signal. Sinuses/Orbits: Negative.  Other: None. IMPRESSION: 1. Heterogeneously contrast-enhancing mass within the left parietal white matter extending along the corpus callosum splenium, most consistent with high-grade glioma. 2. Moderate surrounding hyperintense T2-weighted signal, likely a combination of edema and nonenhancing tumor. 3. 3 mm of rightward midline shift. 4. Intratumoral petechial hemorrhage. Electronically Signed   By: Ulyses Jarred M.D.   On: 11/23/2019 00:09   [place] Intraoperative  Result Date: 11/26/2019 CLINICAL DATA:  Ultrasound was provided for use by the ordering physician, and a technical charge was applied by the performing facility.  No radiologist interpretation/professional services rendered.   CT ABDOMEN PELVIS W CONTRAST  Result Date: 11/23/2019 CLINICAL DATA:  69 y.o. female was brought to the ED due to confusion and gait imbalance. As per patient daughter, patient was having intermittent confusion. Patient denies chest pain, shortness of breath, nausea, vomiting or diarrhea. Mass seen on CT Head and Mri. EXAM: CT CHEST, ABDOMEN, AND PELVIS WITH CONTRAST TECHNIQUE: Multidetector CT imaging of the chest, abdomen and pelvis was performed following the standard protocol during bolus administration of intravenous contrast. CONTRAST:  121mL OMNIPAQUE IOHEXOL 300 MG/ML  SOLN COMPARISON:  Head CT and brain MRI from 11/22/2019 FINDINGS: CT CHEST FINDINGS Cardiovascular: Heart is normal in size and configuration. No pericardial effusion. No coronary artery calcifications. Great vessels are normal in caliber. No aortic atherosclerosis. Mediastinum/Nodes: No enlarged mediastinal, hilar, or axillary lymph nodes. Thyroid gland, trachea, and esophagus demonstrate no significant findings. Lungs/Pleura: Minor scarring at the base of the left upper lobe lingula. Minimal linear dependent lower lobe opacities consistent with atelectasis. Lungs otherwise clear with no masses or nodules. No pleural effusion or pneumothorax.  Musculoskeletal: No fracture or acute finding. No osteoblastic or osteolytic lesions. CT ABDOMEN PELVIS FINDINGS Hepatobiliary: Liver normal in size and overall attenuation. Small low-density lesion between the anterior segment the right lobe and medial segment of the left, consistent with a cyst. Small amount of focal  fat adjacent to the falciform ligament. No other abnormalities. Normal gallbladder. No bile duct dilation. Pancreas: Unremarkable. No pancreatic ductal dilatation or surrounding inflammatory changes. Spleen: Normal in size without focal abnormality. Adrenals/Urinary Tract: No adrenal masses. Kidneys normal in size, orientation and position with symmetric enhancement and excretion. Bilateral low-attenuation renal masses consistent with cysts, largest from the lower pole of the left kidney, 4.5 cm. No renal stones. No hydronephrosis. Normal ureters. Normal bladder. Stomach/Bowel: Small hiatal hernia. Stomach is otherwise within normal limits. Appendix appears normal. No evidence of bowel wall thickening, distention, or inflammatory changes. Vascular/Lymphatic: No significant vascular abnormality. No enlarged lymph nodes. Reproductive: Uterus and bilateral adnexa are unremarkable. Other: No abdominal wall hernia or abnormality. No abdominopelvic ascites. Musculoskeletal: No fracture or acute finding. No osteoblastic or osteolytic lesions. IMPRESSION: 1. No acute findings within the chest, abdomen or pelvis. 2. No evidence of a primary malignancy within the chest, abdomen or pelvis. 3. Small low-density liver lesion consistent with a cyst. Bilateral renal cysts. Electronically Signed   By: Lajean Manes M.D.   On: 11/23/2019 14:38    Lab Data:  CBC: Recent Labs  Lab 11/24/19 0306 11/24/19 0306 11/26/19 0445 11/27/19 0732 11/28/19 0414 11/29/19 0333 11/30/19 0045  WBC 13.9*   < > 11.0* 20.7* 17.4* 12.9* 12.1*  NEUTROABS 12.2*  --  9.7* 19.1*  --   --   --   HGB 13.6   < > 14.4 13.6 13.2  13.4 13.9  HCT 40.2   < > 41.8 39.7 38.9 39.8 41.1  MCV 84.3   < > 82.6 84.1 84.6 83.4 84.6  PLT 382   < > 390 351 357 347 343   < > = values in this interval not displayed.   Basic Metabolic Panel: Recent Labs  Lab 11/25/19 0312 11/25/19 0719 11/26/19 0445 11/27/19 0732 11/28/19 0414 11/29/19 0333 11/30/19 0045  NA   < >  --  139 137 140 139 140  K   < >  --  4.1 3.6 3.9 3.9 3.5  CL   < >  --  106 105 105 105 105  CO2   < >  --  24 22 26 25 25   GLUCOSE   < >  --  138* 179* 137* 144* 157*  BUN   < >  --  16 12 14 19 19   CREATININE   < >  --  0.76 0.74 0.69 0.67 0.72  CALCIUM   < >  --  8.9 7.6* 8.1* 8.3* 8.4*  MG  --  2.1 2.0  --   --   --   --    < > = values in this interval not displayed.   GFR: Estimated Creatinine Clearance: 65.1 mL/min (by C-G formula based on SCr of 0.72 mg/dL). Liver Function Tests: Recent Labs  Lab 11/26/19 0445  AST 15  ALT 22  ALKPHOS 45  BILITOT 0.7  PROT 6.0*  ALBUMIN 3.3*   No results for input(s): LIPASE, AMYLASE in the last 168 hours. No results for input(s): AMMONIA in the last 168 hours. Coagulation Profile: Recent Labs  Lab 11/25/19 0719 11/26/19 0445  INR 1.1 1.2   Cardiac Enzymes: No results for input(s): CKTOTAL, CKMB, CKMBINDEX, TROPONINI in the last 168 hours. BNP (last 3 results) No results for input(s): PROBNP in the last 8760 hours. HbA1C: No results for input(s): HGBA1C in the last 72 hours. CBG: No results for input(s): GLUCAP in the last 168 hours. Lipid  Profile: No results for input(s): CHOL, HDL, LDLCALC, TRIG, CHOLHDL, LDLDIRECT in the last 72 hours. Thyroid Function Tests: No results for input(s): TSH, T4TOTAL, FREET4, T3FREE, THYROIDAB in the last 72 hours. Anemia Panel: No results for input(s): VITAMINB12, FOLATE, FERRITIN, TIBC, IRON, RETICCTPCT in the last 72 hours. Urine analysis:    Component Value Date/Time   COLORURINE YELLOW 11/21/2019 2248   APPEARANCEUR HAZY (A) 11/21/2019 2248   LABSPEC  1.016 11/21/2019 2248   PHURINE 5.0 11/21/2019 2248   GLUCOSEU NEGATIVE 11/21/2019 2248   HGBUR MODERATE (A) 11/21/2019 2248   BILIRUBINUR NEGATIVE 11/21/2019 2248   KETONESUR NEGATIVE 11/21/2019 2248   PROTEINUR NEGATIVE 11/21/2019 2248   NITRITE NEGATIVE 11/21/2019 2248   LEUKOCYTESUR LARGE (A) 11/21/2019 2248     Darran Gabay M.D. [facility] 11/30/2019, 11:12 AM   Call night coverage person covering after 7pm

## 2019-12-01 DIAGNOSIS — T380X5A Adverse effect of glucocorticoids and synthetic analogues, initial encounter: Secondary | ICD-10-CM

## 2019-12-01 DIAGNOSIS — R739 Hyperglycemia, unspecified: Secondary | ICD-10-CM

## 2019-12-01 NOTE — Progress Notes (Signed)
Inpatient Rehabilitation-Admissions Coordinator   Confirmed DC support needed for possible CIR admission. Will need to see if pt still has CIR needs today when working with therapy. Anticipate I will not hear from insurance until tomorrow at the earliest due to the holiday.   Raechel Ache, OTR/L  Rehab Admissions Coordinator  [phone] 12/01/2019 10:06 AM

## 2019-12-01 NOTE — Progress Notes (Addendum)
PROGRESS NOTE    Wanda Ross  DQQ:[id number]  DOB: 04/03/1951  PCP: Fayrene Helper, MD Admit date:11/21/2019 Chief compliant: AMS, ataxia 69 y.o.female,with history of glaucoma, hyperlipidemia, hypertension, chronic back pain/sciatica was brought to the ED due to intermittent confusion and ataxia. Patient had c/o headache, poor appetite for 2 weeks.  ED Course: Afebrile,CT of the head was obtained which showed ill-defined 3.2 cm mass within the left parietal white matter, compatible malignancy. 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 Extensive vasogenic edema noted throughout the left cerebral hemisphere with significant sulcal effacement and minimal rightward midline shift.Neurosurgeon Dr. Marcello Moores was consulted by ED physician who recommended Decadron, MRI brain with and without contrast and transfer to [place]. Hospital course: Patient admitted to [facility] with neurosurgical consultation.  CT chest abdomen pelvis showed no evidence of primary or metastatic lesions.  Patient underwent craniotomy for resection of parietal/splenial mass by neurosurgery on 6/30.  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  Remains on Decadron taper and awaiting CIR evaluation.  Subjective:  Patient sitting in bedside chair.  Appears to be mentating well.  Has been working with physical therapy and requiring a walker to maintain balance.  Awaiting rehab placement. Objective: Vitals:   11/30/19 2002 11/30/19 2300 12/01/19 0300 12/01/19 0811  BP: (!) 157/82 136/66 116/61 (!) 165/81  Pulse: 66 65 (!) 55 67  Resp: 18 19 17 16   Temp: 98.7 F (37.1 C) 98.6 F (37 C) 98.3 F (36.8 C) 97.9 F (36.6 C)  TempSrc: Oral Oral Oral Oral  SpO2: 99% 98% 99% 100%  Weight:      Height:        Intake/Output Summary (Last 24 hours) at 12/01/2019 0931 Last data filed at 12/01/2019 1941 Gross per 24 hour  Intake 1320 ml  Output --  Net 1320 ml    Filed Weights   11/21/19 2104  Weight: 68.9 kg    Physical Examination:  General: Moderately built, no acute distress noted Head ENT: Atraumatic normocephalic, PERRLA, neck supple Heart: S1-S2 heard, regular rate and rhythm, no murmurs.  No leg edema noted Lungs: Equal air entry bilaterally, no rhonchi or rales on exam, no accessory muscle use Abdomen: Bowel sounds heard, soft, nontender, nondistended. No organomegaly.  No CVA tenderness Extremities: No pedal edema.  No cyanosis or clubbing. Neurological: Awake alert oriented x3, no focal weakness or numbness, strength and sensations to crude touch intact.  Gait not tested but per PT note has swaying gait, requiring walker for balance. Skin: No wounds or rashes.   Data Reviewed: I have personally reviewed following labs and imaging studies  CBC: Recent Labs  Lab 11/26/19 0445 11/27/19 0732 11/28/19 0414 11/29/19 0333 11/30/19 0045  WBC 11.0* 20.7* 17.4* 12.9* 12.1*  NEUTROABS 9.7* 19.1*  --   --   --   HGB 14.4 13.6 13.2 13.4 13.9  HCT 41.8 39.7 38.9 39.8 41.1  MCV 82.6 84.1 84.6 83.4 84.6  PLT 390 351 357 347 740   Basic Metabolic Panel: Recent Labs  Lab 11/25/19 0312 11/25/19 0719 11/26/19 0445 11/27/19 0732 11/28/19 0414 11/29/19 0333 11/30/19 0045  NA   < >  --  139 137 140 139 140  K   < >  --  4.1 3.6 3.9 3.9 3.5  CL   < >  --  106 105 105 105 105  CO2   < >  --  24 22 26 25 25   GLUCOSE   < >  --  138* 179* 137* 144* 157*  BUN   < >  --  16 12 14 19 19   CREATININE   < >  --  0.76 0.74 0.69 0.67 0.72  CALCIUM   < >  --  8.9 7.6* 8.1* 8.3* 8.4*  MG  --  2.1 2.0  --   --   --   --    < > = values in this interval not displayed.   GFR: Estimated Creatinine Clearance: 65.1 mL/min (by C-G formula based on SCr of 0.72 mg/dL). Liver Function Tests: Recent Labs  Lab 11/26/19 0445  AST 15  ALT 22  ALKPHOS 45  BILITOT 0.7  PROT 6.0*  ALBUMIN 3.3*   No results for input(s): LIPASE, AMYLASE in the last  168 hours. No results for input(s): AMMONIA in the last 168 hours. Coagulation Profile: Recent Labs  Lab 11/25/19 0719 11/26/19 0445  INR 1.1 1.2   Cardiac Enzymes: No results for input(s): CKTOTAL, CKMB, CKMBINDEX, TROPONINI in the last 168 hours. BNP (last 3 results) No results for input(s): PROBNP in the last 8760 hours. HbA1C: No results for input(s): HGBA1C in the last 72 hours. CBG: No results for input(s): GLUCAP in the last 168 hours. Lipid Profile: No results for input(s): CHOL, HDL, LDLCALC, TRIG, CHOLHDL, LDLDIRECT in the last 72 hours. Thyroid Function Tests: No results for input(s): TSH, T4TOTAL, FREET4, T3FREE, THYROIDAB in the last 72 hours. Anemia Panel: No results for input(s): VITAMINB12, FOLATE, FERRITIN, TIBC, IRON, RETICCTPCT in the last 72 hours. Sepsis Labs: No results for input(s): PROCALCITON, LATICACIDVEN in the last 168 hours.  Recent Results (from the past 240 hour(s))  Urine culture     Status: Abnormal   Collection Time: 11/21/19 10:48 PM   Specimen: Urine, Clean Catch  Result Value Ref Range Status   Specimen Description   Final    URINE, CLEAN CATCH Performed at [facility], [address], [place], [place] [address]    Special Requests   Final    NONE Performed at [facility], [address], [place], [place] [address]    Culture (A)  Final    60,000 COLONIES/mL GROUP B STREP(S.AGALACTIAE)ISOLATED TESTING AGAINST S. AGALACTIAE NOT ROUTINELY PERFORMED DUE TO PREDICTABILITY OF AMP/PEN/VAN SUSCEPTIBILITY. 30,000 COLONIES/mL STAPHYLOCOCCUS EPIDERMIDIS    Report Status 11/24/2019 FINAL  Final   Organism ID, Bacteria STAPHYLOCOCCUS EPIDERMIDIS (A)  Final      Susceptibility   Staphylococcus epidermidis - MIC*    CIPROFLOXACIN <=0.5 SENSITIVE Sensitive     GENTAMICIN <=0.5 SENSITIVE Sensitive     NITROFURANTOIN <=16 SENSITIVE Sensitive     OXACILLIN >=4 RESISTANT Resistant     TETRACYCLINE <=1 SENSITIVE Sensitive     VANCOMYCIN 2  SENSITIVE Sensitive     TRIMETH/SULFA <=10 SENSITIVE Sensitive     CLINDAMYCIN <=0.25 SENSITIVE Sensitive     RIFAMPIN <=0.5 SENSITIVE Sensitive     Inducible Clindamycin NEGATIVE Sensitive     * 30,000 COLONIES/mL STAPHYLOCOCCUS EPIDERMIDIS  SARS Coronavirus 2 by RT PCR (hospital order, performed in [place] hospital lab) Nasopharyngeal Nasopharyngeal Swab     Status: None   Collection Time: 11/22/19 12:41 AM   Specimen: Nasopharyngeal Swab  Result Value Ref Range Status   SARS Coronavirus 2 NEGATIVE NEGATIVE Final    Comment: (NOTE) SARS-CoV-2 target nucleic acids are NOT DETECTED.  The SARS-CoV-2 RNA is generally detectable in upper and lower respiratory specimens during the acute phase of infection. The lowest concentration of SARS-CoV-2 viral copies this  assay can detect is 250 copies / mL. A negative result does not preclude SARS-CoV-2 infection and should not be used as the sole basis for treatment or other patient management decisions.  A negative result may occur with improper specimen collection / handling, submission of specimen other than nasopharyngeal swab, presence of viral mutation(s) within the areas targeted by this assay, and inadequate number of viral copies (<250 copies / mL). A negative result must be combined with clinical observations, patient history, and epidemiological information.  Fact Sheet for Patients:   StrictlyIdeas.no  Fact Sheet for Healthcare Providers: BankingDealers.co.za  This test is not yet approved or  cleared by the [place] FDA and has been authorized for detection and/or diagnosis of SARS-CoV-2 by FDA under an Emergency Use Authorization (EUA).  This EUA will remain in effect (meaning this test can be used) for the duration of the COVID-19 declaration under Section 564(b)(1) of the Act, 21 U.S.C. section 360bbb-3(b)(1), unless the authorization is terminated or revoked  sooner.  Performed at [facility], [address] [place], [place] [address]   Surgical PCR screen     Status: None   Collection Time: 11/26/19  5:45 AM   Specimen: Nasal Mucosa; Nasal Swab  Result Value Ref Range Status   MRSA, PCR NEGATIVE NEGATIVE Final   Staphylococcus aureus NEGATIVE NEGATIVE Final    Comment: (NOTE) The Xpert SA Assay (FDA approved for NASAL specimens in patients 80 years of age and older), is one component of a comprehensive surveillance program. It is not intended to diagnose infection nor to guide or monitor treatment. Performed at [facility], [place] [address] [place], [place] [address]       Radiology Studies: No results found.    Scheduled Meds: . amLODipine  5 mg Oral Daily  . Chlorhexidine Gluconate Cloth  6 each Topical Daily  . cloNIDine  0.3 mg Oral QHS  . dexamethasone  4 mg Oral Q8H   Followed by  . dexamethasone  2 mg Oral Q8H   Followed by  . [START ON 12/03/2019] dexamethasone  2 mg Oral Q12H   Followed by  . [START ON 12/05/2019] dexamethasone  1 mg Oral Q12H   Followed by  . [START ON 12/07/2019] dexamethasone  1 mg Oral Daily  . docusate sodium  100 mg Oral BID  . dorzolamide-timolol  1 drop Right Eye BID  . enoxaparin (LOVENOX) injection  40 mg Subcutaneous Q24H  . levETIRAcetam  500 mg Oral BID  . pantoprazole  40 mg Oral Daily  . pravastatin  10 mg Oral q1800  . senna  1 tablet Oral BID  . vitamin B-12  2,500 mcg Oral Daily   Continuous Infusions:   Assessment/Plan:  1.  Brain tumor: Highly suspicious for GBM.  Awaiting confirmatory path report from [place].  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  Remains on Decadron taper.  Neurosurgery following along. Per d/w Neurosurgery, Dr Marcello Moores on 7/2, who discussed with Dr. Mickeal Skinner and oincology, plan for possible XRT  in 2-3 weeks and chemo, will be determined by the final biopsy results.  CIR evaluation pending.  2.  Abnormal UA: Urine cultures grew insignificant (60 K CFU) of staph epidermidis.  Afebrile.  Antibiotics not initiated.  3.  Hypertension: Continue current medications.  4.  Hyperlipidemia: Continue statins.  5.  Chronic back pain, sciatica: No acute worsening.  Follow-up  CIR.  6.  Glaucoma: Resume prior therapy.  7.  Steroid-induced hyperglycemia and leukocytosis:  appear to be improving with tapering doses of steroids.  DVT prophylaxis: On SCDs, can start Lovenox if unable to ambulate as postop day 4. Code Status: Full code Family / Patient Communication: Discussed with daughter in detail at bedside. Disposition Plan:   Status is: Inpatient  Remains inpatient appropriate because:Unsafe d/c plan   Dispo: The patient is from: Home              Anticipated d/c is to: CIR              Anticipated d/c date is: 1 day              Patient currently is medically stable to d/c.    Time spent: 25 minutes     >50% time spent in discussions with care team and coordination of care.    Guilford Shi, MD [facility] Pager in [place]  If 7PM-7AM, please contact night-coverage [email or web address] 12/01/2019, 9:31 AM

## 2019-12-01 NOTE — Progress Notes (Signed)
Occupational Therapy Treatment Patient Details Name: Wanda Ross MRN: [id number] DOB: 07/29/1950 Today's Date: 12/01/2019    History of present illness 69 yo female with onset of confusion and changes in balance was admitted, now dx with L parietal glioma, L cerebral hemisphere edema, procedure on 6/30 for neurosurgical resection.  PMHx:  glaucoma, HTN, HLD, Impaired glucose tolerance, multinodal goiter   OT comments  Upon arrival, pt up and awake in bed - agreeable to skilled-OT services. Pt sat up EOB with supervision and completed visual scanning/letter cancellation sheet with several errors - mostly on right side. Noted completing 1 row in sequence and needing verbal cues to scan all the way to the R side. After, Min Guard sit<>stand and ambulation with min A and RW to sink for grooming requiring max verbal cues to avoid objects on R side. Pt presenting with decreased STM with forgetting why we went to the sink. Pt needing max cues to sequence and find objects on R side of sink and avoid objects on R side when ambulating in room. Noted pt holding objects in R hand and forgetting them - needing verbal prompts for awareness and sequencing for objects. Prompted pt to scan room and look to R side when ambulating to recliner - pt did better avoiding objects when ambulating to recliner. Pt's daughter entered room at the end of the session wanting an update and activities to do with her mom when she visits. I educated the daughter and pt on activities and progress. Pt progressing towards goals and dc plans remain appropriate. Will follow acutely.     Follow Up Recommendations  CIR    Equipment Recommendations  Other (comment) (TBD at next level of care)    Recommendations for Other Services Rehab consult    Precautions / Restrictions Precautions Precautions: Fall Restrictions Weight Bearing Restrictions: No       Mobility Bed Mobility Overal bed mobility: Needs Assistance Bed  Mobility: Supine to Sit     Supine to sit: HOB elevated;Supervision     General bed mobility comments: Supervision for safety  Transfers Overall transfer level: Needs assistance Equipment used: Rolling walker (2 wheeled) Transfers: Sit to/from Stand Sit to Stand: Min guard         General transfer comment: min guard assist for safety    Balance Overall balance assessment: Needs assistance Sitting-balance support: Feet supported;No upper extremity supported Sitting balance-Leahy Scale: Good     Standing balance support: No upper extremity supported;During functional activity Standing balance-Leahy Scale: Good Standing balance comment: able to use B UE for grooming with good balance                           ADL either performed or assessed with clinical judgement   ADL Overall ADL's : Needs assistance/impaired     Grooming: Wash/dry hands;Wash/dry face;Oral care;Min A;Standing;Cueing for sequencing;Cueing for compensatory techniques Grooming Details (indicate cue type and reason): Grooming at sink in standing needed max verbal cues for sequencing, problem solving, and visual scanning.                 Toilet Transfer: Min guard;Cueing for Armed forces operational officer Details (indicate cue type and reason): simulated transfer to recliner         Functional mobility during ADLs: Min A;Cueing for safety;Rolling walker;Cueing for sequencing General ADL Comments: Min A for ADLs with RW. Pt needed max verbal cues for visual scanning and safety with innatention and  field cut on R side     Vision   Vision Assessment?: Yes Eye Alignment: Within Functional Limits Ocular Range of Motion: Within Functional Limits Alignment/Gaze Preference: Gaze left Visual Fields: Right homonymous hemianopsia;Right visual field deficit;Other (comment) (noted errors in letter cancellation to R side ) Additional Comments: Pt completed visual scanning/letter cancellation sheet  with 6 errors and completely missing a row in the sequence. Most errors noted on R side but also cognitive portion present with missing one row.          Cognition Arousal/Alertness: Awake/alert Behavior During Therapy: WFL for tasks assessed/performed Overall Cognitive Status: Impaired/Different from baseline Area of Impairment: Attention;Memory;Following commands;Safety/judgement;Awareness;Problem solving                   Current Attention Level: Sustained Memory: Decreased short-term memory Following Commands: Follows one step commands inconsistently Safety/Judgement: Decreased awareness of safety;Decreased awareness of deficits Awareness: Intellectual Problem Solving: Difficulty sequencing;Slow processing;Decreased initiation;Requires verbal cues General Comments: Pt with decreased attention and memory noted during functional ADL activity and visual scanning activity. Needing max verbal cues to scan environment and sequence.              General Comments Pt's daughter came into the room for the last half of session and asked about progress and activities to complete with mom during visit. Provided visual scanning worksheets to complete during stay    Pertinent Vitals/ Pain       Pain Assessment: No/denies pain         Frequency  Min 2X/week        Progress Toward Goals  OT Goals(current goals can now be found in the care plan section)  Progress towards OT goals: Progressing toward goals  Acute Rehab OT Goals Patient Stated Goal: to get better and go home OT Goal Formulation: With patient Time For Goal Achievement: 12/12/19 Potential to Achieve Goals: Good  Plan Discharge plan remains appropriate       AM-PAC OT "6 Clicks" Daily Activity     Outcome Measure   Help from another person eating meals?: A Little Help from another person taking care of personal grooming?: A Little Help from another person toileting, which includes using toliet, bedpan, or  urinal?: A Little Help from another person bathing (including washing, rinsing, drying)?: A Little Help from another person to put on and taking off regular upper body clothing?: A Little Help from another person to put on and taking off regular lower body clothing?: A Little 6 Click Score: 18    End of Session Equipment Utilized During Treatment: Gait belt;Rolling walker  OT Visit Diagnosis: Unsteadiness on feet (R26.81);Other symptoms and signs involving cognitive function;Low vision, both eyes (H54.2)   Activity Tolerance Patient tolerated treatment well   Patient Left in chair;with call bell/phone within reach;with chair alarm set;with family/visitor present   Nurse Communication Mobility status        Time: 5102-5852 OT Time Calculation (min): 17 min  Charges: OT General Charges $OT Visit: 1 Visit OT Treatments $Self Care/Home Management : 8-22 mins  Beckhem Isadore/OTS   Yenny Kosa 12/01/2019, 12:25 PM

## 2019-12-01 NOTE — Progress Notes (Signed)
Subjective: Patient reports no headache  Objective: Vital signs in last 24 hours: Temp:  [97.6 F (36.4 C)-98.7 F (37.1 C)] 97.9 F (36.6 C) (07/05 1136) Pulse Rate:  [55-72] 72 (07/05 1136) Resp:  [16-19] 18 (07/05 1136) BP: (116-165)/(61-82) 145/76 (07/05 1136) SpO2:  [98 %-100 %] 99 % (07/05 1136)  Intake/Output from previous day: 07/04 0701 - 07/05 0700 In: 960 [P.O.:960] Out: -  Intake/Output this shift: Total I/O In: 360 [P.O.:360] Out: -   NAD Incision c/d Finger agnosia improved Mild cognitive slowing  Lab Results: Recent Labs    11/29/19 0333 11/30/19 0045  WBC 12.9* 12.1*  HGB 13.4 13.9  HCT 39.8 41.1  PLT 347 343   BMET Recent Labs    11/29/19 0333 11/30/19 0045  NA 139 140  K 3.9 3.5  CL 105 105  CO2 25 25  GLUCOSE 144* 157*  BUN 19 19  CREATININE 0.67 0.72  CALCIUM 8.3* 8.4*    Studies/Results: No results found.  Assessment/Plan: S/p resection of primary brain tumor - dex taper - continue PT - possible CIR  Wanda Ross 12/01/2019, 12:02 PM

## 2019-12-01 NOTE — Progress Notes (Signed)
  Speech Language Pathology Treatment: Cognitive-Linquistic  Patient Details Name: Wanda Ross MRN: [id number] DOB: 04-14-1951 Today's Date: 12/01/2019 Time: 8099-8338 SLP Time Calculation (min) (ACUTE ONLY): 22 min  Assessment / Plan / Recommendation Clinical Impression  Pt was seen for cognitive-linguistic treatment with her daughter present. She was alert and cooperative throughout the session. Pt's daughter reported that the pt has been improving since admission and that she is demonstrating less difficulty with word retrieval. Pt had multiple interruptions during the session and it was ultimately terminated to allow MD to assess pt. The impact of distractions on her performance is considered. She achieved 70% accuracy with a medication management (prescription) task increasing to 100% with repetition and verbal prompts. She compled a mental manipulation sequencing task with 67% accuracy increasing to 100% with cues. She required cues to attend throughout the session, but reported that she has always been easily distracted. SLP will continue to follow pt.    HPI HPI: Pt is a 69 yo female presenting with intermittent confusion and headaches. CT Head showed a 3.2 cm mass within the L parietal white matter and extensive vasogenic edema throughout the L hemisphere. Pt is s/p resection of suspected glioma 6/30. PMH includes HTN, HLD, glaucoma. Pt S/p crani for GBM      SLP Plan  Continue with current plan of care       Recommendations                   Oral Care Recommendations: Oral care BID Follow up Recommendations: 24 hour supervision/assistance;Other (comment) (SLP f/u at next level of care) SLP Visit Diagnosis: Cognitive communication deficit (S50.539) Plan: Continue with current plan of care       Lenee Franze I. Hardin Negus, [place], [place] Office number [phone] Pager [place] 12/01/2019, 12:52  PM

## 2019-12-02 ENCOUNTER — Encounter (HOSPITAL_COMMUNITY): Payer: Self-pay | Admitting: Physical Medicine & Rehabilitation

## 2019-12-02 ENCOUNTER — Other Ambulatory Visit: Payer: Self-pay

## 2019-12-02 ENCOUNTER — Inpatient Hospital Stay (HOSPITAL_COMMUNITY)
Admission: RE | Admit: 2019-12-02 | Discharge: 2019-12-10 | DRG: 949 | Disposition: A | Discharge status: Home / Self Care | Payer: Medicare PPO | Source: Intra-hospital | Attending: Physical Medicine & Rehabilitation | Admitting: Physical Medicine & Rehabilitation

## 2019-12-02 DIAGNOSIS — Z48811 Encounter for surgical aftercare following surgery on the nervous system: Secondary | ICD-10-CM | POA: Diagnosis not present

## 2019-12-02 DIAGNOSIS — Z794 Long term (current) use of insulin: Secondary | ICD-10-CM

## 2019-12-02 DIAGNOSIS — D72829 Elevated white blood cell count, unspecified: Secondary | ICD-10-CM | POA: Diagnosis present

## 2019-12-02 DIAGNOSIS — R269 Unspecified abnormalities of gait and mobility: Secondary | ICD-10-CM | POA: Diagnosis present

## 2019-12-02 DIAGNOSIS — Z833 Family history of diabetes mellitus: Secondary | ICD-10-CM

## 2019-12-02 DIAGNOSIS — Z9889 Other specified postprocedural states: Secondary | ICD-10-CM | POA: Diagnosis not present

## 2019-12-02 DIAGNOSIS — R748 Abnormal levels of other serum enzymes: Secondary | ICD-10-CM | POA: Diagnosis present

## 2019-12-02 DIAGNOSIS — R7303 Prediabetes: Secondary | ICD-10-CM

## 2019-12-02 DIAGNOSIS — Y92239 Unspecified place in hospital as the place of occurrence of the external cause: Secondary | ICD-10-CM | POA: Diagnosis present

## 2019-12-02 DIAGNOSIS — C719 Malignant neoplasm of brain, unspecified: Secondary | ICD-10-CM | POA: Diagnosis present

## 2019-12-02 DIAGNOSIS — E440 Moderate protein-calorie malnutrition: Secondary | ICD-10-CM | POA: Diagnosis not present

## 2019-12-02 DIAGNOSIS — R4182 Altered mental status, unspecified: Secondary | ICD-10-CM | POA: Diagnosis present

## 2019-12-02 DIAGNOSIS — Z7983 Long term (current) use of bisphosphonates: Secondary | ICD-10-CM

## 2019-12-02 DIAGNOSIS — Z803 Family history of malignant neoplasm of breast: Secondary | ICD-10-CM | POA: Diagnosis not present

## 2019-12-02 DIAGNOSIS — E785 Hyperlipidemia, unspecified: Secondary | ICD-10-CM | POA: Diagnosis not present

## 2019-12-02 DIAGNOSIS — T380X5A Adverse effect of glucocorticoids and synthetic analogues, initial encounter: Secondary | ICD-10-CM | POA: Diagnosis not present

## 2019-12-02 DIAGNOSIS — Z6824 Body mass index (BMI) 24.0-24.9, adult: Secondary | ICD-10-CM | POA: Diagnosis not present

## 2019-12-02 DIAGNOSIS — H5340 Unspecified visual field defects: Secondary | ICD-10-CM | POA: Diagnosis present

## 2019-12-02 DIAGNOSIS — R7401 Elevation of levels of liver transaminase levels: Secondary | ICD-10-CM | POA: Diagnosis present

## 2019-12-02 DIAGNOSIS — E46 Unspecified protein-calorie malnutrition: Secondary | ICD-10-CM | POA: Diagnosis not present

## 2019-12-02 DIAGNOSIS — H409 Unspecified glaucoma: Secondary | ICD-10-CM | POA: Diagnosis present

## 2019-12-02 DIAGNOSIS — D72828 Other elevated white blood cell count: Secondary | ICD-10-CM | POA: Diagnosis not present

## 2019-12-02 DIAGNOSIS — G9389 Other specified disorders of brain: Secondary | ICD-10-CM

## 2019-12-02 DIAGNOSIS — Z8249 Family history of ischemic heart disease and other diseases of the circulatory system: Secondary | ICD-10-CM | POA: Diagnosis not present

## 2019-12-02 DIAGNOSIS — E8809 Other disorders of plasma-protein metabolism, not elsewhere classified: Secondary | ICD-10-CM | POA: Diagnosis not present

## 2019-12-02 DIAGNOSIS — Z79899 Other long term (current) drug therapy: Secondary | ICD-10-CM | POA: Diagnosis not present

## 2019-12-02 DIAGNOSIS — R739 Hyperglycemia, unspecified: Secondary | ICD-10-CM | POA: Diagnosis not present

## 2019-12-02 DIAGNOSIS — Z298 Encounter for other specified prophylactic measures: Secondary | ICD-10-CM | POA: Diagnosis not present

## 2019-12-02 DIAGNOSIS — I10 Essential (primary) hypertension: Secondary | ICD-10-CM | POA: Diagnosis not present

## 2019-12-02 LAB — GLUCOSE, CAPILLARY
Glucose-Capillary: 189 mg/dL — ABNORMAL HIGH (ref 70–99)
Glucose-Capillary: 141 mg/dL — ABNORMAL HIGH (ref 70–99)

## 2019-12-02 LAB — HEMOGLOBIN A1C
Hgb A1c MFr Bld: 6.1 % — ABNORMAL HIGH (ref 4.8–5.6)
Mean Plasma Glucose: 128.37 mg/dL

## 2019-12-02 MED ORDER — PANTOPRAZOLE SODIUM 40 MG PO TBEC: 40.0000 mg | DELAYED_RELEASE_TABLET | Freq: Every day | ORAL | Status: DC

## 2019-12-02 MED ORDER — DOCUSATE SODIUM 100 MG PO CAPS: 100.0000 mg | ORAL_CAPSULE | Freq: Two times a day (BID) | ORAL | 0 refills | Status: DC

## 2019-12-02 MED ORDER — LEVETIRACETAM 500 MG PO TABS: 500.0000 mg | ORAL_TABLET | Freq: Two times a day (BID) | ORAL | Status: DC

## 2019-12-02 MED ORDER — INSULIN ASPART 100 UNIT/ML ~~LOC~~ SOLN: 0.0000 [IU] | Freq: Three times a day (TID) | SUBCUTANEOUS | Status: DC

## 2019-12-02 MED ORDER — DEXAMETHASONE 2 MG PO TABS: 2.0000 mg | ORAL_TABLET | Freq: Three times a day (TID) | ORAL | Status: DC

## 2019-12-02 MED ORDER — DEXAMETHASONE 2 MG PO TABS
1.0000 mg | ORAL_TABLET | Freq: Every day | ORAL | Status: AC
  Administered 2019-12-07 – 2019-12-08 (×2): 1 mg via ORAL
  Filled 2019-12-02 (×2): qty 1

## 2019-12-02 MED ORDER — DOCUSATE SODIUM 100 MG PO CAPS
100.0000 mg | ORAL_CAPSULE | Freq: Two times a day (BID) | ORAL | Status: DC
  Administered 2019-12-02 – 2019-12-10 (×16): 100 mg via ORAL
  Filled 2019-12-02 (×16): qty 1

## 2019-12-02 MED ORDER — POLYETHYLENE GLYCOL 3350 17 G PO PACK: 17.0000 g | PACK | Freq: Every day | ORAL | Status: DC | PRN

## 2019-12-02 MED ORDER — ONDANSETRON HCL 4 MG PO TABS: 4.0000 mg | ORAL_TABLET | ORAL | 0 refills | Status: DC | PRN

## 2019-12-02 MED ORDER — LEVETIRACETAM 500 MG PO TABS
500.0000 mg | ORAL_TABLET | Freq: Two times a day (BID) | ORAL | Status: AC
  Administered 2019-12-02 – 2019-12-03 (×3): 500 mg via ORAL
  Filled 2019-12-02 (×3): qty 1

## 2019-12-02 MED ORDER — ENOXAPARIN SODIUM 40 MG/0.4ML ~~LOC~~ SOLN
40.0000 mg | SUBCUTANEOUS | Status: DC
  Administered 2019-12-03 – 2019-12-10 (×8): 40 mg via SUBCUTANEOUS
  Filled 2019-12-02 (×8): qty 0.4

## 2019-12-02 MED ORDER — DEXAMETHASONE 1 MG PO TABS: 1.0000 mg | ORAL_TABLET | Freq: Every day | ORAL | Status: DC

## 2019-12-02 MED ORDER — ENOXAPARIN SODIUM 40 MG/0.4ML ~~LOC~~ SOLN: 40.0000 mg | SUBCUTANEOUS | Status: DC

## 2019-12-02 MED ORDER — ONDANSETRON HCL 4 MG PO TABS: 4.0000 mg | ORAL_TABLET | ORAL | Status: DC | PRN

## 2019-12-02 MED ORDER — CYANOCOBALAMIN 2500 MCG PO TABS: 2500.0000 ug | ORAL_TABLET | Freq: Every day | ORAL | Status: DC

## 2019-12-02 MED ORDER — VITAMIN B-12 1000 MCG PO TABS
2500.0000 ug | ORAL_TABLET | Freq: Every day | ORAL | Status: DC
  Administered 2019-12-03 – 2019-12-10 (×8): 2500 ug via ORAL
  Filled 2019-12-02 (×9): qty 3

## 2019-12-02 MED ORDER — INSULIN ASPART 100 UNIT/ML ~~LOC~~ SOLN
0.0000 [IU] | Freq: Three times a day (TID) | SUBCUTANEOUS | Status: DC
  Administered 2019-12-03: 1 [IU] via SUBCUTANEOUS
  Administered 2019-12-06: 2 [IU] via SUBCUTANEOUS
  Administered 2019-12-07: 1 [IU] via SUBCUTANEOUS

## 2019-12-02 MED ORDER — AMLODIPINE BESYLATE 5 MG PO TABS
5.0000 mg | ORAL_TABLET | Freq: Every day | ORAL | Status: DC
  Administered 2019-12-03 – 2019-12-10 (×8): 5 mg via ORAL
  Filled 2019-12-02 (×8): qty 1

## 2019-12-02 MED ORDER — SENNA 8.6 MG PO TABS
1.0000 | ORAL_TABLET | Freq: Two times a day (BID) | ORAL | Status: DC
  Administered 2019-12-02 – 2019-12-10 (×16): 8.6 mg via ORAL
  Filled 2019-12-02 (×16): qty 1

## 2019-12-02 MED ORDER — PRAVASTATIN SODIUM 10 MG PO TABS
10.0000 mg | ORAL_TABLET | Freq: Every day | ORAL | Status: DC
  Administered 2019-12-03 – 2019-12-09 (×7): 10 mg via ORAL
  Filled 2019-12-02 (×7): qty 1

## 2019-12-02 MED ORDER — ACETAMINOPHEN 325 MG PO TABS: 650.0000 mg | ORAL_TABLET | ORAL | Status: AC | PRN

## 2019-12-02 MED ORDER — DEXAMETHASONE 2 MG PO TABS
2.0000 mg | ORAL_TABLET | Freq: Three times a day (TID) | ORAL | Status: AC
  Administered 2019-12-02: 2 mg via ORAL
  Filled 2019-12-02: qty 1

## 2019-12-02 MED ORDER — CLONIDINE HCL 0.1 MG PO TABS
0.3000 mg | ORAL_TABLET | Freq: Every day | ORAL | Status: DC
  Administered 2019-12-02 – 2019-12-09 (×8): 0.3 mg via ORAL
  Filled 2019-12-02 (×9): qty 3

## 2019-12-02 MED ORDER — CHLORHEXIDINE GLUCONATE CLOTH 2 % EX PADS: 6.0000 | MEDICATED_PAD | Freq: Every day | CUTANEOUS | Status: DC

## 2019-12-02 MED ORDER — DEXAMETHASONE 1 MG PO TABS: 1.0000 mg | ORAL_TABLET | Freq: Two times a day (BID) | ORAL | Status: DC

## 2019-12-02 MED ORDER — DEXAMETHASONE 2 MG PO TABS
1.0000 mg | ORAL_TABLET | Freq: Two times a day (BID) | ORAL | Status: AC
  Administered 2019-12-05 – 2019-12-06 (×4): 1 mg via ORAL
  Filled 2019-12-02 (×4): qty 1

## 2019-12-02 MED ORDER — ONDANSETRON HCL 4 MG/2ML IJ SOLN: 4.0000 mg | INTRAMUSCULAR | Status: DC | PRN

## 2019-12-02 MED ORDER — DEXAMETHASONE 2 MG PO TABS: 2.0000 mg | ORAL_TABLET | Freq: Two times a day (BID) | ORAL | Status: DC

## 2019-12-02 MED ORDER — INSULIN ASPART 100 UNIT/ML ~~LOC~~ SOLN: 0.0000 [IU] | Freq: Three times a day (TID) | SUBCUTANEOUS | 11 refills | Status: DC

## 2019-12-02 MED ORDER — DORZOLAMIDE HCL-TIMOLOL MAL 2-0.5 % OP SOLN
1.0000 [drp] | Freq: Two times a day (BID) | OPHTHALMIC | Status: DC
  Administered 2019-12-02 – 2019-12-10 (×16): 1 [drp] via OPHTHALMIC
  Filled 2019-12-02: qty 10

## 2019-12-02 MED ORDER — ACETAMINOPHEN 325 MG PO TABS
650.0000 mg | ORAL_TABLET | ORAL | Status: DC | PRN
  Administered 2019-12-06: 650 mg via ORAL
  Filled 2019-12-02: qty 2

## 2019-12-02 MED ORDER — HYDROCODONE-ACETAMINOPHEN 5-325 MG PO TABS: 1.0000 | ORAL_TABLET | ORAL | Status: DC | PRN

## 2019-12-02 MED ORDER — DEXAMETHASONE 2 MG PO TABS
2.0000 mg | ORAL_TABLET | Freq: Two times a day (BID) | ORAL | Status: AC
  Administered 2019-12-03 – 2019-12-04 (×4): 2 mg via ORAL
  Filled 2019-12-02 (×4): qty 1

## 2019-12-02 MED ORDER — POLYETHYLENE GLYCOL 3350 17 G PO PACK: 17.0000 g | PACK | Freq: Every day | ORAL | 0 refills | Status: DC | PRN

## 2019-12-02 MED ORDER — ACETAMINOPHEN 650 MG RE SUPP: 650.0000 mg | RECTAL | Status: DC | PRN

## 2019-12-02 MED ORDER — PANTOPRAZOLE SODIUM 40 MG PO TBEC
40.0000 mg | DELAYED_RELEASE_TABLET | Freq: Every day | ORAL | Status: DC
  Administered 2019-12-03 – 2019-12-10 (×8): 40 mg via ORAL
  Filled 2019-12-02 (×8): qty 1

## 2019-12-02 NOTE — Progress Notes (Signed)
Wanda Arn, MD  Physician  Physical Medicine and Rehabilitation  Consult Note     Signed  Date of Service:  11/28/2019  6:17 AM      Related encounter: ED to Hosp-Admission (Current) from 11/21/2019 in [place] [facility]      Signed      Expand All Collapse All           Physical Medicine and Rehabilitation Consult Reason for Consult: Gait disorder Referring Physician: [facility]     HPI: Wanda Ross is a 69 y.o. right-handed female with history of hyperlipidemia, hypertension.  History taken from chart review, son, patient, and therapies.  Patient lives alone.  1 level home 2 steps to entry.  Independent prior to admission.  She does have a daughter and 2 sons that can assist as needed.  She presented on 11/22/2019 with AMS and altered gait. CT/MRI showed contrast-enhancing mass within the left parietal white matter extending along the corpus callosum splenium, most consistent with high-grade glioma.  Moderate surrounding hyperintense T2 weighted signal likely combination of edema and nonenhancing tumor. 3 mm rightward midline shift.  CT of the chest abdomen pelvis showed no acute findings.  Patient underwent left craniotomy for resection parietal and splenial mass on 11/26/2019 per Dr. Duffy Rhody.  Decadron protocol as indicated.  Placed on Lovenox for DVT prophylaxis. Tolerating a regular diet.  Therapy evaluations completed with recommendations of physical medicine rehab consult.  Discussed with therapies, patient appears to have had some improvement with cognitive deficits as well as retention since yesterday.     Review of Systems  Constitutional: Negative for chills and fever.  HENT: Negative for hearing loss.   Eyes: Negative for blurred vision and double vision.  Respiratory: Negative for cough and shortness of breath.   Cardiovascular: Negative for chest pain, palpitations and leg swelling.  Gastrointestinal: Positive for constipation. Negative for  heartburn, nausea and vomiting.  Genitourinary: Negative for dysuria, flank pain and hematuria.  Skin: Negative for rash.  Neurological: Positive for focal weakness, weakness and headaches.       Gait disturbance  All other systems reviewed and are negative.       Past Medical History:  Diagnosis Date  . Glaucoma    . Hyperlipidemia 2010  . Hypertension 1990  . IGT (impaired glucose tolerance) 05/11/2010  . Multinodular goiter 2010    normal function         Past Surgical History:  Procedure Laterality Date  . APPLICATION OF CRANIAL NAVIGATION Left 11/26/2019    Procedure: APPLICATION OF CRANIAL NAVIGATION;  Surgeon: [name], MD;  Location: [facility];  Service: Neurosurgery;  Laterality: Left;  APPLICATION OF CRANIAL NAVIGATION  . COLONOSCOPY N/A 06/16/2016    Procedure: COLONOSCOPY;  Surgeon: Danie Binder, MD;  [facility];  Service: Endoscopy;  Laterality: N/A;  9:30 AM  . COLONOSCOPY N/A 10/22/2019    Procedure: COLONOSCOPY;  Surgeon: Daneil Dolin, MD;  [facility];  Service: Endoscopy;  Laterality: N/A;  9:30  . CRANIOTOMY Left 11/26/2019    Procedure: LEFT PARIETAL CRANIOTOMY FOR RESECTION OF TUMOR;  Surgeon: [name], MD;  Location: [facility];  Service: Neurosurgery;  Laterality: Left;  LEFT PARIETAL CRANIOTOMY FOR RESECTION OF TUMOR  . EYE SURGERY   2017    3 on left eye 1 on right eye for glaucoma by Dr. Venetia Maxon   . EYE SURGERY   2018    Cataract Surgery  by Dr. Arlina Robes   . OPERATIVE ULTRASOUND N/A 11/26/2019    Procedure: OPERATIVE ULTRASOUND;  Surgeon: [name], MD;  Location: [facility];  Service: Neurosurgery;  Laterality: N/A;  OPERATIVE ULTRASOUND         Family History  Problem Relation Age of Onset  . Dementia Father    . Dementia Mother    . Diabetes Mother    . Hypertension Mother    . Breast cancer Sister    . Hypertension Sister    . Diabetes Brother      Social History:  reports that she has never smoked. She  has never used smokeless tobacco. She reports that she does not drink alcohol and does not use drugs. Allergies: No Known Allergies Medications Prior to Admission  Medication Sig Dispense Refill  . alendronate (FOSAMAX) 70 MG tablet Take 1 tablet (70 mg total) by mouth every 7 (seven) days. Take with a full glass of water on an empty stomach. 4 tablet 11  . amLODipine (NORVASC) 5 MG tablet Take 1 tablet (5 mg total) by mouth daily. 90 tablet 1  . Calcium Carbonate-Vit D-Min (CALCIUM 1200) 1200-1000 MG-UNIT CHEW Chew 1,200 mg by mouth daily.        . cloNIDine (CATAPRES) 0.3 MG tablet TAKE 1 TABLET BY MOUTH EVERYDAY AT BEDTIME (Patient taking differently: Take 0.3 mg by mouth at bedtime. ) 90 tablet 0  . Cyanocobalamin (B-12) 2500 MCG SUBL Place 1 tablet under the tongue daily.      . DENTA 5000 PLUS 1.1 % CREA dental cream Place 1 application onto teeth at bedtime.       . dorzolamide-timolol (COSOPT) 22.3-6.8 MG/ML ophthalmic solution Place 1 drop into the right eye 2 (two) times daily.       . Multiple Vitamins-Minerals (CENTRUM SILVER ULTRA WOMENS) TABS Take 1 tablet by mouth daily.       Marland Kitchen triamterene-hydrochlorothiazide (MAXZIDE) 75-50 MG tablet TAKE 1 TABLET BY MOUTH EVERY DAY (Patient taking differently: Take 0.5 tablets by mouth daily. ) 90 tablet 1  . cholecalciferol (VITAMIN D) 25 MCG (1000 UNIT) tablet Take 1,000 mg by mouth daily.           Home: Home Living Family/patient expects to be discharged to:: Inpatient rehab Living Arrangements: Alone Available Help at Discharge: Family (daughter [name] present today) Type of Home: House Home Access: Stairs to enter [employer] of Steps: 2 Entrance Stairs-Rails: None Home Layout: One level Biochemist, clinical: Standard Home Equipment: Environmental consultant - 2 wheels Additional Comments: Pt is not using an AD at home currently  Lives With: Daughter  Functional History: Prior Function Level of Independence: Independent Comments: pt  independent, walks multiple miles per day in her neighborhood.  Functional Status:  Mobility: Bed Mobility Overal bed mobility: Needs Assistance Bed Mobility: Supine to Sit Supine to sit: Supervision General bed mobility comments: decreased awareness of where the edge of the bed is, so close supervision for safety.  Transfers Overall transfer level: Needs assistance Equipment used: 2 person hand held assist Transfers: Sit to/from Stand Sit to Stand: Min assist General transfer comment: Min bil hand held assist for safety to come to standing and balance.  Ambulation/Gait Ambulation/Gait assistance: +2 physical assistance, Min assist Gait Distance (Feet): 75 Feet Assistive device: 2 person hand held assist Gait Pattern/deviations: Step-through pattern, Staggering left, Staggering right, Decreased step length - right General Gait Details: PT on one side (R) daughter on the other side assisting pt with gait into the  hallway, gaze is to the left unless cued (multimodal) to attend to the right.  R leg is a bit slower to progress forward during reciprocal gait, but does not show signs of significant drag, knee instability or buckling during gait.   Gait velocity: decreased Gait velocity interpretation: 1.31 - 2.62 ft/sec, indicative of limited community ambulator   ADL:   Cognition: Cognition Overall Cognitive Status: Impaired/Different from baseline Arousal/Alertness: Awake/alert Orientation Level: Oriented X4 Attention: Sustained Sustained Attention: Impaired Sustained Attention Impairment: Verbal basic Memory: Impaired Memory Impairment: Storage deficit, Retrieval deficit, Decreased recall of new information, Decreased short term memory Decreased Short Term Memory: Verbal basic Awareness: Impaired Awareness Impairment: Intellectual impairment, Emergent impairment, Anticipatory impairment Problem Solving: Impaired Problem Solving Impairment: Verbal basic, Functional  complex Safety/Judgment: Impaired Cognition Arousal/Alertness: Awake/alert Behavior During Therapy: Impulsive Overall Cognitive Status: Impaired/Different from baseline Area of Impairment: Following commands, Safety/judgement, Awareness, Problem solving Current Attention Level: Sustained Following Commands: Follows one step commands inconsistently Safety/Judgement: Decreased awareness of safety, Decreased awareness of deficits Awareness: Intellectual Problem Solving: Difficulty sequencing, Requires verbal cues, Requires tactile cues General Comments: Pt with difficulty understanding instructions even simple ones, right sided inattention on top of likely R visual deficit, and decreased ability to process and then sequenc through even basic commands without multimodal cueing, rephrasing or repetition.    Blood pressure 117/65, pulse 61, temperature 98.5 F (36.9 C), temperature source Oral, resp. rate 15, height 5\' 8"  (1.727 m), weight 68.9 kg, SpO2 100 %. Physical Exam Vitals reviewed.  Constitutional:      General: She is not in acute distress.    Appearance: She is obese.  HENT:     Head: Normocephalic.     Comments: Crani site C/D/I    Right Ear: External ear normal.     Left Ear: External ear normal.     Nose: Nose normal.  Eyes:     General:        Right eye: No discharge.        Left eye: No discharge.     Extraocular Movements: Extraocular movements intact.  Pulmonary:     Effort: Pulmonary effort is normal. No respiratory distress.     Breath sounds: No stridor.  Abdominal:     General: Abdomen is flat.     Palpations: Abdomen is soft.  Musculoskeletal:     Cervical back: Neck supple.     Comments: No edema or tenderness in extremities  Skin:    General: Skin is warm and dry.     Comments: Crani site C/D/I  Neurological:     Mental Status: She is alert.     Comments: Alert and oriented x3 Mild right inattention LUE/LE: 5/5 proximal distal RUE/RLE: 4+/5 proximal  distal Follows simple commands.  Psychiatric:        Mood and Affect: Mood normal.        Behavior: Behavior normal.        Lab Results Last 24 Hours       Results for orders placed or performed during the hospital encounter of 11/21/19 (from the past 24 hour(s))  Basic metabolic panel     Status: Abnormal    Collection Time: 11/27/19  7:32 AM  Result Value Ref Range    Sodium 137 135 - 145 mmol/L    Potassium 3.6 3.5 - 5.1 mmol/L    Chloride 105 98 - 111 mmol/L    CO2 22 22 - 32 mmol/L    Glucose, Bld 179 (H) 70 -  99 mg/dL    BUN 12 8 - 23 mg/dL    Creatinine, Ser 0.74 0.44 - 1.00 mg/dL    Calcium 7.6 (L) 8.9 - 10.3 mg/dL    GFR calc non Af Amer >60 >60 mL/min    GFR calc Af Amer >60 >60 mL/min    Anion gap 10 5 - 15  CBC with Differential     Status: Abnormal    Collection Time: 11/27/19  7:32 AM  Result Value Ref Range    WBC 20.7 (H) 4.0 - 10.5 K/uL    RBC 4.72 3.87 - 5.11 MIL/uL    Hemoglobin 13.6 12.0 - 15.0 g/dL    HCT 39.7 36 - 46 %    MCV 84.1 80.0 - 100.0 fL    MCH 28.8 26.0 - 34.0 pg    MCHC 34.3 30.0 - 36.0 g/dL    RDW 13.2 11.5 - 15.5 %    Platelets 351 150 - 400 K/uL    nRBC 0.0 0.0 - 0.2 %    Neutrophils Relative % 92 %    Neutro Abs 19.1 (H) 1.7 - 7.7 K/uL    Lymphocytes Relative 2 %    Lymphs Abs 0.4 (L) 0.7 - 4.0 K/uL    Monocytes Relative 5 %    Monocytes Absolute 1.1 (H) 0 - 1 K/uL    Eosinophils Relative 0 %    Eosinophils Absolute 0.0 0 - 0 K/uL    Basophils Relative 0 %    Basophils Absolute 0.0 0 - 0 K/uL    Immature Granulocytes 1 %    Abs Immature Granulocytes 0.14 (H) 0.00 - 0.07 K/uL  CBC     Status: Abnormal    Collection Time: 11/28/19  4:14 AM  Result Value Ref Range    WBC 17.4 (H) 4.0 - 10.5 K/uL    RBC 4.60 3.87 - 5.11 MIL/uL    Hemoglobin 13.2 12.0 - 15.0 g/dL    HCT 38.9 36 - 46 %    MCV 84.6 80.0 - 100.0 fL    MCH 28.7 26.0 - 34.0 pg    MCHC 33.9 30.0 - 36.0 g/dL    RDW 13.6 11.5 - 15.5 %    Platelets 357 150 - 400 K/uL     nRBC 0.0 0.0 - 0.2 %  Basic metabolic panel     Status: Abnormal    Collection Time: 11/28/19  4:14 AM  Result Value Ref Range    Sodium 140 135 - 145 mmol/L    Potassium 3.9 3.5 - 5.1 mmol/L    Chloride 105 98 - 111 mmol/L    CO2 26 22 - 32 mmol/L    Glucose, Bld 137 (H) 70 - 99 mg/dL    BUN 14 8 - 23 mg/dL    Creatinine, Ser 0.69 0.44 - 1.00 mg/dL    Calcium 8.1 (L) 8.9 - 10.3 mg/dL    GFR calc non Af Amer >60 >60 mL/min    GFR calc Af Amer >60 >60 mL/min    Anion gap 9 5 - 15       Imaging Results (Last 48 hours)  MR BRAIN W WO CONTRAST   Result Date: 11/27/2019 CLINICAL DATA:  Postop day 1 following resection of a left parietal tumor (likely high-grade glioma). EXAM: MRI HEAD WITHOUT AND WITH CONTRAST TECHNIQUE: Multiplanar, multiecho pulse sequences of the brain and surrounding structures were obtained without and with intravenous contrast. CONTRAST:  79mL GADAVIST GADOBUTROL 1 MMOL/ML IV SOLN  COMPARISON:  11/22/2019 FINDINGS: Brain: Sequelae of interval left parietal craniotomy and tumor resection are identified. There is a resection cavity in the left parietal lobe and splenium of the corpus callosum with a small amount of blood products. A small amount of restricted diffusion is noted along the margins of the resection cavity without a sizable acute infarct. There is a small amount of enhancement along the margins of the resection cavity which is largely curvilinear and likely postoperative. There is more nodular enhancement at the anterior aspect of the resection site at the level of the atrium of the left lateral ventricle which is felt to at least partially reflect choroid plexus. There is moderate nonenhancing T2 hyperintensity/edema in the white matter of the left parietal, occipital, and posterior temporal lobes which has decreased from the prior study. There is persistent nonenhancing T2 hyperintensity which crosses the splenium of the corpus callosum into the right periatrial  white matter. There is trace residual rightward midline shift, decreased from prior. There is small volume pneumocephalus, and there is a small extra-axial fluid collection subjacent to the craniotomy. Mild dural thickening and enhancement over the posterior left cerebral convexity is likely postoperative. Vascular: Major intracranial vascular flow voids are preserved. Skull and upper cervical spine: Left parietal craniotomy with small volume fluid and gas in the scalp soft tissues. Sinuses/Orbits: Left cataract extraction. Paranasal sinuses and mastoid air cells are clear. Other: None. IMPRESSION: 1. Postoperative changes from interval left parietal and splenial tumor resection. Mild enhancement along the margins of the resection cavity as above. This will serve as a baseline for follow-up examinations. 2. Decreased surrounding T2 hyperintensity compatible with decreased edema and likely underlying nonenhancing tumor. Electronically Signed   By: Logan Bores M.D.   On: 11/27/2019 13:58    [place] Intraoperative   Result Date: 11/26/2019 CLINICAL DATA:  Ultrasound was provided for use by the ordering physician, and a technical charge was applied by the performing facility.  No radiologist interpretation/professional services rendered.       Assessment/Plan: Diagnosis: Glioma Labs independently reviewed.  Records reviewed and summated above.   1. Does the need for close, 24 hr/day medical supervision in concert with the patient's rehab needs make it unreasonable for this patient to be served in a less intensive setting? Potentially 2. Co-Morbidities requiring supervision/potential complications: hyperlipidemia, HTN (monitor and provide prns in accordance with increased physical exertion and pain), steroid-induced hyperglycemia (Monitor in accordance with exercise and adjust meds as necessary), leukocytosis (repeat labs, cont to monitor for signs and symptoms of infection, further workup if indicated-likely  secondary to steroids) 3. Due to safety, disease management, medication administration and patient education, does the patient require 24 hr/day rehab nursing? Yes 4. Does the patient require coordinated care of a physician, rehab nurse, therapy disciplines of PT/OT/SLP to address physical and functional deficits in the context of the above medical diagnosis(es)? Potentially Addressing deficits in the following areas: balance, endurance, locomotion, strength, transferring, bathing, dressing, toileting, cognition and psychosocial support 5. Can the patient actively participate in an intensive therapy program of at least 3 hrs of therapy per day at least 5 days per week? Yes 6. The potential for patient to make measurable gains while on inpatient rehab is good 7. Anticipated functional outcomes upon discharge from inpatient rehab are supervision  with PT, supervision with OT, modified independent and supervision with SLP. 8. Estimated rehab length of stay to reach the above functional goals is: 5-7 days. 9. Anticipated discharge destination: Home 10. Overall  Rehab/Functional Prognosis: good   RECOMMENDATIONS: This patient's condition is appropriate for continued rehabilitative care in the following setting: Will await reevaluation by therapies.  Will consider CIR patient does not progress to supervision level of functioning. Patient has agreed to participate in recommended program. Yes Note that insurance prior authorization may be required for reimbursement for recommended care.   Comment: Rehab Admissions Coordinator to follow up.   I have personally performed a face to face diagnostic evaluation, including, but not limited to relevant history and physical exam findings, of this patient and developed relevant assessment and plan.  Additionally, I have reviewed and concur with the physician assistant's documentation above.    Delice Lesch, MD, ABPMR Cathlyn Parsons, PA-C 11/28/2019         Revision History                     Routing History                Note Details  Author Posey Pronto, Domenick Bookbinder, MD File Time 11/28/2019 11:54 AM  Author Type Physician Status Signed  Last Editor Wanda Arn, MD Service Physical Medicine and Rehabilitation

## 2019-12-02 NOTE — IPOC Note (Signed)
Individualized overall Plan of Care [facility]) Patient Details Name: Wanda Ross MRN: [id number] DOB: 1951/02/23  Admitting Diagnosis: Glioma [facility])  Hospital Problems: Principal Problem:   Glioma ([place]) Active Problems:   Prediabetes   Seizure prophylaxis     Functional Problem List: Nursing Endurance, Pain, Medication Management, Safety  PT Balance, Endurance, Motor, Perception, Safety, Sensory  OT Balance, Behavior, Cognition, Endurance, Motor, Pain, Perception, Safety, Sensory, Vision  SLP Cognition  TR         Basic ADL's: OT Eating, Grooming, Bathing, Toileting, Dressing     Advanced  ADL's: OT Simple Meal Preparation, Laundry     Transfers: PT Bed Mobility, Bed to Chair, Car, [employer], Futures trader, Metallurgist: PT Ambulation, Stairs     Additional Impairments: OT Fuctional Use of Upper Extremity  SLP Social Cognition   Problem Solving, Memory, Awareness  TR      Anticipated Outcomes Item Anticipated Outcome  Self Feeding Supervision  Swallowing      Basic self-care  Media planner Transfers Supervision  Bowel/Bladder  n/a  Transfers  S, LRAD  Locomotion  S, LRAD  Communication     Cognition  Supervision A  Pain  at or below level 4  Safety/Judgment  Maintain safety with cues/reminders   Therapy Plan: PT Intensity: Minimum of 1-2 x/day ,45 to 90 minutes PT Frequency: 5 out of 7 days PT Duration Estimated Length of Stay: 7 days OT Intensity: Minimum of 1-2 x/day, 45 to 90 minutes OT Frequency: 5 out of 7 days OT Duration/Estimated Length of Stay: 6-9 days SLP Intensity: Minumum of 1-2 x/day, 30 to 90 minutes SLP Frequency: 3 to 5 out of 7 days SLP Duration/Estimated Length of Stay: 7 days    Team Interventions: Nursing Interventions Patient/Family Education, Pain Management, Psychosocial Support, Disease Management/Prevention, Medication Management, Discharge Planning  PT  interventions Ambulation/gait training, Discharge planning, Functional mobility training, Psychosocial support, Therapeutic Activities, Visual/perceptual remediation/compensation, Balance/vestibular training, Disease management/prevention, Neuromuscular re-education, Skin care/wound management, Therapeutic Exercise, Wheelchair propulsion/positioning, Cognitive remediation/compensation, DME/adaptive equipment instruction, Pain management, Splinting/orthotics, UE/LE Strength taining/ROM, Community reintegration, Technical sales engineer stimulation, Patient/family education, UE/LE Coordination activities, Stair training  OT Interventions Training and development officer, Discharge planning, Functional electrical stimulation, Pain management, Therapeutic Activities, Self Care/advanced ADL retraining, UE/LE Coordination activities, Cognitive remediation/compensation, Disease mangement/prevention, Functional mobility training, Patient/family education, Therapeutic Exercise, Visual/perceptual remediation/compensation, Academic librarian, Engineer, drilling, Psychosocial support, Splinting/orthotics, UE/LE Strength taining/ROM, Wheelchair propulsion/positioning, Neuromuscular re-education  SLP Interventions Cognitive remediation/compensation, English as a second language teacher, Functional tasks, Patient/family education, Internal/external aids  TR Interventions    SW/CM Interventions     Barriers to Discharge MD  Medical stability, Wound care, and Pending chemo/radiation  Nursing      PT Decreased caregiver support, Home environment access/layout 2 STE no rails, will likely need 24/7 S at DC  OT      SLP      SW       Team Discharge Planning: Destination: PT-Home ,OT- Home , SLP-Home Projected Follow-up: PT-Outpatient PT, OT-  24 hour supervision/assistance, SLP-24 hour supervision/assistance, Outpatient SLP Projected Equipment Needs: PT-To be determined, OT- To be determined, SLP-None recommended by  SLP Equipment Details: PT- , OT-  Patient/family involved in discharge planning: PT- Patient,  OT-Patient, SLP-Patient  MD ELOS: 5-7 days. Medical Rehab Prognosis:  Fair Assessment: 69 year old right-handed female with history of hyperlipidemia, hypertension. Presented 11/22/2019 with altered mental status and altered gait.  Admission chemistries with potassium 2.8, glucose 155,  urinalysis negative nitrite.  CT/MRI showed contrast-enhancing mass within the left parietal white matter extending along the corpus callosum splenium, most consistent with high-grade glioma.  Moderate surrounding hyperintense T2 weighted signal likely combination of edema and nonenhancing tumor with a 3 mm rightward midline shift.  CT of the chest abdomen pelvis showed no acute findings.  Patient underwent left craniotomy for resection parietal and splenial mass on 11/26/2019 per Dr. Duffy Rhody.  Decadron protocol as indicated.  Maintained on Keppra for seizure prophylaxis. Patient with resulting functional deficits with mobility, transfers, endurance, balance, self-care, cognition.  Will set goals for Supervision with PT/OT/SLP.    Due to the current state of emergency, patients may not be receiving their 3-hours of Medicare-mandated therapy.  See Team Conference Notes for weekly updates to the plan of care

## 2019-12-02 NOTE — Plan of Care (Signed)
  Problem: Safety: Goal: Ability to remain free from injury will improve Outcome: Progressing   

## 2019-12-02 NOTE — TOC Transition Note (Signed)
Transition of Care [facility]) - CM/SW Discharge Note   Patient Details  Name: Wanda Ross MRN: [id number] Date of Birth: 1950/12/27  [facility] CM/SW Contact:  Pollie Friar, RN Phone Number: 12/02/2019, 12:21 PM   Clinical Narrative:    Pt is discharging to CIR today. CM signing off.   Final next level of care: IP Rehab Facility Barriers to Discharge: No Barriers Identified   Patient Goals and CMS Choice        Discharge Placement                       Discharge Plan and Services                                     Social Determinants of Health (SDOH) Interventions     Readmission Risk Interventions No flowsheet data found.

## 2019-12-02 NOTE — Progress Notes (Signed)
Occupational Therapy Treatment Patient Details Name: Wanda Ross MRN: [id number] DOB: Jan 28, 1951 Today's Date: 12/02/2019    History of present illness 69 yo female with onset of confusion and changes in balance was admitted, now dx with L parietal glioma, L cerebral hemisphere edema, procedure on 6/30 for neurosurgical resection.  PMHx:  glaucoma, HTN, HLD, Impaired glucose tolerance, multinodal goiter   OT comments  Pt progressing toward established OT goals. Pt completed scanning and cognitive activity while standing and reaching outside base of support, requiring min cues for memory and prompting to scan environment. She demonstrated improved attention to task and improvement with short term memory as she was able to recall and communicate events of today's session to PT upon her arrival at the end of our session. Pt demonstrated ability to complete LB dressing with minguard for safety. Pt will continue to benefit from skilled OT services to maximize safety and independence with ADL/IADL and functional mobility. Will continue to follow acutely and progress as tolerated.    Follow Up Recommendations  CIR    Equipment Recommendations  Other (comment) (TBD)    Recommendations for Other Services Rehab consult    Precautions / Restrictions Precautions Precautions: Fall Precaution Comments: R inattention Restrictions Weight Bearing Restrictions: No       Mobility Bed Mobility Overal bed mobility: Needs Assistance Bed Mobility: Supine to Sit     Supine to sit: Supervision     General bed mobility comments: supervision for safety  Transfers Overall transfer level: Needs assistance Equipment used: Rolling walker (2 wheeled) Transfers: Sit to/from Stand Sit to Stand: Min guard         General transfer comment: minguard for safety    Balance Overall balance assessment: Needs assistance Sitting-balance support: Feet supported;No upper extremity supported Sitting  balance-Leahy Scale: Good     Standing balance support: Single extremity supported;During functional activity Standing balance-Leahy Scale: Fair Standing balance comment: reaching outside base of support with single UE support                           ADL either performed or assessed with clinical judgement   ADL Overall ADL's : Needs assistance/impaired     Grooming: Standing;Supervision/safety Grooming Details (indicate cue type and reason): cues for scanning                  Toilet Transfer: Supervision/safety;Cueing for safety;RW Toilet Transfer Details (indicate cue type and reason): simulated with in room mobility;min cues to scan environment Toileting- Clothing Manipulation and Hygiene: Min guard;Sit to/from stand       Functional mobility during ADLs: Supervision/safety;Cueing for safety;Rolling walker General ADL Comments: cues for scanning environment during in room mobility;pt limited by Right inattention and visual field cut     Vision   Vision Assessment?: Yes Visual Fields: Right homonymous hemianopsia;Right visual field deficit;Other (comment) (noted pt bumping into objects on right side ) Additional Comments: pt completed number locating activity (numbers placed on wall in all fields of vision) pt retrieved numbers in chronological order (ascending and descending) scanning in all fields;pt able to locate items on right side with increased time and compensatory strategies to locate numbers   Perception     Praxis      Cognition Arousal/Alertness: Awake/alert Behavior During Therapy: WFL for tasks assessed/performed Overall Cognitive Status: Impaired/Different from baseline Area of Impairment: Attention;Memory;Following commands;Safety/judgement;Awareness;Problem solving  Current Attention Level: Selective Memory: Decreased short-term memory Following Commands: Follows one step commands with increased  time Safety/Judgement: Decreased awareness of safety;Decreased awareness of deficits Awareness: Emergent Problem Solving: Difficulty sequencing;Slow processing;Decreased initiation;Requires verbal cues General Comments: pt with decreased attention and memory;needing minimal cues to scan and locate numbers on wall and retrieve numbers in chronological order both ascending and descending (0-10) and locating items on left and right side of cabinet in superior and inferior field of vision;following session, pt able to recount events of today's session and relay them to PT upon her arrival;pt required mincues for scanning environment;        Exercises     Shoulder Instructions       General Comments      Pertinent Vitals/ Pain       Pain Assessment: No/denies pain Faces Pain Scale: No hurt  Home Living                                          Prior Functioning/Environment              Frequency  Min 2X/week        Progress Toward Goals  OT Goals(current goals can now be found in the care plan section)  Progress towards OT goals: Progressing toward goals  Acute Rehab OT Goals Patient Stated Goal: to get better and go home OT Goal Formulation: With patient Time For Goal Achievement: 12/12/19 Potential to Achieve Goals: Good ADL Goals Pt Will Perform Grooming: Independently;standing Pt Will Perform Upper Body Bathing: Independently;sitting;standing Pt Will Perform Lower Body Bathing: Independently;sit to/from stand Pt Will Perform Upper Body Dressing: Independently;sitting;standing Pt Will Perform Lower Body Dressing: Independently;sit to/from stand Pt Will Transfer to Toilet: Independently;ambulating;regular height toilet Pt Will Perform Toileting - Clothing Manipulation and hygiene: Independently;sit to/from stand Additional ADL Goal #1: Pt will be able to complete pen and paper tasks focusing on visual scanning with S Additional ADL Goal #2: Pt will  be able to work on visual scanning in environment with S and no VCs  Plan Discharge plan remains appropriate    Co-evaluation                 AM-PAC OT "6 Clicks" Daily Activity     Outcome Measure   Help from another person eating meals?: A Little Help from another person taking care of personal grooming?: A Little Help from another person toileting, which includes using toliet, bedpan, or urinal?: A Little Help from another person bathing (including washing, rinsing, drying)?: A Little Help from another person to put on and taking off regular upper body clothing?: A Little Help from another person to put on and taking off regular lower body clothing?: A Little 6 Click Score: 18    End of Session Equipment Utilized During Treatment: Gait belt;Rolling walker  OT Visit Diagnosis: Unsteadiness on feet (R26.81);Other symptoms and signs involving cognitive function;Low vision, both eyes (H54.2)   Activity Tolerance Patient tolerated treatment well   Patient Left Other (comment) (in room with PT present)   Nurse Communication Mobility status        Time: 0086-7619 OT Time Calculation (min): 20 min  Charges: OT General Charges $OT Visit: 1 Visit OT Treatments $Therapeutic Activity: 8-22 mins  Helene Kelp OTR/L Acute Rehabilitation Services Office: [place] 12/02/2019, 11:06 AM

## 2019-12-02 NOTE — Progress Notes (Signed)
Patient arrived to unit around 1615. Admission completed. Oriented to room, roommate, and unit routines.

## 2019-12-02 NOTE — Progress Notes (Signed)
Subjective: Patient reports no headache Objective: Vital signs in last 24 hours: Temp:  [97.8 F (36.6 C)-98.8 F (37.1 C)] 98.5 F (36.9 C) (07/06 1132) Pulse Rate:  [64-84] 64 (07/06 1132) Resp:  [14-18] 16 (07/06 1132) BP: (115-176)/(59-87) 118/59 (07/06 1132) SpO2:  [94 %-100 %] 98 % (07/06 1132)  Intake/Output from previous day: 07/05 0701 - 07/06 0700 In: 840 [P.O.:840] Out: 300 [Urine:300] Intake/Output this shift: Total I/O In: 240 [P.O.:240] Out: -   Awake, alert, Ox3.  Cognitive slowing. R VF cut No pronator drift Incision c/d  Lab Results: Recent Labs    11/30/19 0045  WBC 12.1*  HGB 13.9  HCT 41.1  PLT 343   BMET Recent Labs    11/30/19 0045  NA 140  K 3.5  CL 105  CO2 25  GLUCOSE 157*  BUN 19  CREATININE 0.72  CALCIUM 8.4*    Studies/Results: No results found.  Assessment/Plan: S/p craniotomy for likely GBM - decadron taper - f/u path - will likely require postop XRT and chemotherapy in a few weeks-- discussed with neuro-oncology and radiation oncology teams already   [name] 12/02/2019, 11:57 AM

## 2019-12-02 NOTE — Progress Notes (Signed)
Wanda Ross, OT  Rehab Admission Coordinator  Physical Medicine and Rehabilitation  PMR Pre-admission     Signed  Date of Service:  11/28/2019  4:20 PM      Related encounter: ED to Hosp-Admission (Current) from 11/21/2019 in [facility]      Signed       PMR Admission Coordinator Pre-Admission Assessment   Patient: Wanda Ross is an 69 y.o., female MRN: [id number] DOB: April 18, 1951 Height: _0  (172.7 cm) Weight: 68.9 kg                                                                                                                                                  Insurance Information HMO:     PPO: yes     PCP:      IPA:      80/20:      OTHER:  PRIMARY: [employer]      Policy#: [id number]      Subscriber: patient CM Name: Wanda Ross      Phone#: [phone]    Fax#: [phone] Pre-Cert#: [id number]      Employer:  Wanda Ross provided by Wanda Ross for admit to CIR today, 12/02/19. Weekly updates are due to Wanda Ross (p): [phone] [id number] and (f): [phone] Benefits:  Phone #: online     Name: [email or web address] Eff. Date: 05/30/19-05/28/20     Deduct: does not have for in-network providers      Out of Pocket Max: $4,000 ($80 met)      Life Max: NA  CIR: $160/day co-pay for days 1-10, $0/day co-pay for days 11+      SNF: $0/day co-pay for days 1-20, $50/day co-pay for days 21-100; limited to 100 days/cal yr Outpatient: $20/visit co-pay; visits limited by medical necessity      Home Health: 100% coverage, 0% co-insurance; limited by medical necessity      DME: 80% coverage, 20% co-insurance      Providers:  SECONDARY: None      Policy#:       Phone#:    Development worker, community:       Phone#:    The Therapist, art Information Summary" for patients in Inpatient Rehabilitation Facilities with attached "Privacy Act [place] Records" was provided and verbally reviewed with: Patient   Emergency Contact Information         Contact Information      Name Relation Home Work Mobile    [place] Sister [phone]        Ross,Wanda Daughter [phone]   [phone]       Current Medical History  Patient Admitting Diagnosis: Glioma   History of Present Illness:Wanda Ross is a 69 year old right-handed female with history of hyperlipidemia, hypertension.  Patient lives alone 1 level home 2 steps to entry.  Independent prior to admission.  She does have  a daughter and 2 sons who can assist as needed.  Presented 11/22/2019 with altered mental status and altered gait.  Admission chemistries with potassium 2.8, glucose 155, urinalysis negative nitrite.  CT/MRI showed contrast-enhancing mass within the left parietal white matter extending along the corpus callosum splenium, most consistent with high-grade glioma.  Moderate surrounding hyperintense T2 weighted signal likely combination of edema and nonenhancing tumor with a 3 mm rightward midline shift.  CT of the chest abdomen pelvis showed no acute findings.  Patient underwent left craniotomy for resection parietal and splenial mass on 11/26/2019 per Dr. Duffy Ross.  Decadron protocol as indicated.  Maintained on Keppra for seizure prophylaxis.  Placed on Lovenox for DVT prophylaxis 11/27/2019.  Tolerating regular diet.  Therapy evaluations completed and patient is to be admitted for a comprehensive rehab program on 12/02/19.     Glasgow Coma Scale Score: 15   Past Medical History      Past Medical History:  Diagnosis Date  . Glaucoma    . Hyperlipidemia 2010  . Hypertension 1990  . IGT (impaired glucose tolerance) 05/11/2010  . Multinodular goiter 2010    normal function      Family History  family history includes Breast cancer in her sister; Dementia in her father and mother; Diabetes in her brother and mother; Hypertension in her mother and sister.   Prior Rehab/Hospitalizations:  Has the patient had prior rehab or hospitalizations prior to admission? No   Has the  patient had major surgery during 100 days prior to admission? Yes   Current Medications    Current Facility-Administered Medications:  .  acetaminophen (TYLENOL) tablet 650 mg, 650 mg, Oral, Q4H PRN, 650 mg at 11/27/19 1743 **OR** acetaminophen (TYLENOL) suppository 650 mg, 650 mg, Rectal, Q4H PRN, Wanda, Ripudeep K, MD .  amLODipine (NORVASC) tablet 5 mg, 5 mg, Oral, Daily, Wanda, Ripudeep K, MD, 5 mg at 12/02/19 0916 .  Chlorhexidine Gluconate Cloth 2 % PADS 6 each, 6 each, Topical, Daily, Wanda, Ripudeep K, MD, 6 each at 11/28/19 1600 .  cloNIDine (CATAPRES) tablet 0.3 mg, 0.3 mg, Oral, QHS, Wanda, Ripudeep K, MD, 0.3 mg at 12/01/19 2149 .  [COMPLETED] dexamethasone (DECADRON) tablet 4 mg, 4 mg, Oral, Q6H, 4 mg at 11/29/19 0542 **FOLLOWED BY** [EXPIRED] dexamethasone (DECADRON) tablet 4 mg, 4 mg, Oral, Q8H, 4 mg at 12/01/19 0559 **FOLLOWED BY** dexamethasone (DECADRON) tablet 2 mg, 2 mg, Oral, Q8H, 2 mg at 12/02/19 0556 **FOLLOWED BY** [START ON 12/03/2019] dexamethasone (DECADRON) tablet 2 mg, 2 mg, Oral, Q12H **FOLLOWED BY** [START ON 12/05/2019] dexamethasone (DECADRON) tablet 1 mg, 1 mg, Oral, Q12H **FOLLOWED BY** [START ON 12/07/2019] dexamethasone (DECADRON) tablet 1 mg, 1 mg, Oral, Daily, Wanda, Ripudeep K, MD .  docusate sodium (COLACE) capsule 100 mg, 100 mg, Oral, BID, Wanda, Ripudeep K, MD, 100 mg at 12/02/19 0916 .  dorzolamide-timolol (COSOPT) 22.3-6.8 MG/ML ophthalmic solution 1 drop, 1 drop, Right Eye, BID, Wanda, Ripudeep K, MD, 1 drop at 12/02/19 0916 .  enalaprilat (VASOTEC) injection 1.25 mg, 1.25 mg, Intravenous, Q6H PRN, Wanda, Ripudeep K, MD, 1.25 mg at 12/02/19 0003 .  enoxaparin (LOVENOX) injection 40 mg, 40 mg, Subcutaneous, Q24H, Wanda, Ripudeep K, MD, 40 mg at 12/02/19 0917 .  HYDROcodone-acetaminophen (NORCO/VICODIN) 5-325 MG per tablet 1 tablet, 1 tablet, Oral, Q4H PRN, Wanda, Ripudeep K, MD, 1 tablet at 11/28/19 1235 .  insulin aspart (novoLOG) injection 0-9 Units, 0-9 Units, Subcutaneous, TID  WC, Wanda, Neelima, MD .  labetalol (NORMODYNE) injection 10  mg, 10 mg, Intravenous, Q10 min PRN, Wanda, Ripudeep K, MD, 10 mg at 12/01/19 1948 .  levETIRAcetam (KEPPRA) tablet 500 mg, 500 mg, Oral, BID, Wanda, Ripudeep K, MD, 500 mg at 12/02/19 0916 .  morphine 2 MG/ML injection 1-2 mg, 1-2 mg, Intravenous, Q2H PRN, Wanda, Ripudeep K, MD, 2 mg at 11/26/19 1627 .  ondansetron (ZOFRAN) tablet 4 mg, 4 mg, Oral, Q4H PRN **OR** ondansetron (ZOFRAN) injection 4 mg, 4 mg, Intravenous, Q4H PRN, Wanda, Ripudeep K, MD .  pantoprazole (PROTONIX) EC tablet 40 mg, 40 mg, Oral, Daily, Wanda, Ripudeep K, MD, 40 mg at 12/02/19 0916 .  polyethylene glycol (MIRALAX / GLYCOLAX) packet 17 g, 17 g, Oral, Daily PRN, Wanda, Ripudeep K, MD .  pravastatin (PRAVACHOL) tablet 10 mg, 10 mg, Oral, q1800, Wanda, Ripudeep K, MD, 10 mg at 12/01/19 1748 .  promethazine (PHENERGAN) tablet 12.5-25 mg, 12.5-25 mg, Oral, Q4H PRN, Wanda, Ripudeep K, MD .  senna (SENOKOT) tablet 8.6 mg, 1 tablet, Oral, BID, Wanda, Ripudeep K, MD, 8.6 mg at 12/02/19 0917 .  sodium phosphate (FLEET) 7-19 GM/118ML enema 1 enema, 1 enema, Rectal, Once PRN, Wanda, Ripudeep K, MD .  vitamin B-12 (CYANOCOBALAMIN) tablet 2,500 mcg, 2,500 mcg, Oral, Daily, Wanda, Ripudeep K, MD, 2,500 mcg at 12/02/19 7510   Patients Current Diet:     Diet Order                      Diet regular Room service appropriate? Yes with Assist; Fluid consistency: Thin  Diet effective now                      Precautions / Restrictions Precautions Precautions: Fall Precaution Comments: R inattention Restrictions Weight Bearing Restrictions: No    Has the patient had 2 or more falls or a fall with injury in the past year?No   Prior Activity Level Community (5-7x/wk): active, enjoys outdoors, gardening, drove PTA, no AD use. Independent and lives alone   Prior Functional Level Prior Function Level of Independence: Independent Comments: pt independent, walks multiple miles per day in  her neighborhood.    Self Care: Did the patient need help bathing, dressing, using the toilet or eating?  Independent   Indoor Mobility: Did the patient need assistance with walking from room to room (with or without device)? Independent   Stairs: Did the patient need assistance with internal or external stairs (with or without device)? Independent   Functional Cognition: Did the patient need help planning regular tasks such as shopping or remembering to take medications? Independent   Home Assistive Devices / Equipment Home Assistive Devices/Equipment: None Home Equipment: Walker - 2 wheels   Prior Device Use: Indicate devices/aids used by the patient prior to current illness, exacerbation or injury? None of the above   Current Functional Level Cognition   Arousal/Alertness: Awake/alert Overall Cognitive Status: Impaired/Different from baseline Current Attention Level: Selective Orientation Level: Oriented X4 Following Commands: Follows one step commands with increased time Safety/Judgement: Decreased awareness of safety, Decreased awareness of deficits General Comments: pt with decreased attention and memory;needing minimal cues to scan and locate numbers on wall and retrieve numbers in chronological order both ascending and descending (0-10) and locating items on left and right side of cabinet in superior and inferior field of vision;following session, pt able to recount events of today's session and relay them to PT upon her arrival;pt required mincues for scanning environment; Attention: Sustained Sustained Attention: Impaired Sustained Attention  Impairment: Verbal basic Memory: Impaired Memory Impairment: Storage deficit, Retrieval deficit, Decreased recall of new information, Decreased short term memory Decreased Short Term Memory: Verbal basic Awareness: Impaired Awareness Impairment: Intellectual impairment, Emergent impairment, Anticipatory impairment Problem Solving:  Impaired Problem Solving Impairment: Verbal basic, Functional complex Safety/Judgment: Impaired    Extremity Assessment (includes Sensation/Coordination)   Upper Extremity Assessment: Overall WFL for tasks assessed  Lower Extremity Assessment: Defer to PT evaluation RLE Deficits / Details: Desptie maintaining R LE strength, her leg is slower to coordinate movement to command and it takes manual and tactile cues to get her to do muscle testing to command on her right side instead of repeatedly on the left when asked to do it on the right.  RLE Coordination: decreased fine motor, decreased gross motor (slow/delayed motor response)     ADLs   Overall ADL's : Needs assistance/impaired Eating/Feeding: Set up, Supervision/ safety, Sitting Grooming: Standing, Supervision/safety Grooming Details (indicate cue type and reason): cues for scanning  Upper Body Bathing: Supervision/ safety, Set up, Sitting Lower Body Bathing: Minimal assistance, Sit to/from stand Upper Body Dressing : Minimal assistance, Sitting Lower Body Dressing: Minimal assistance, Sit to/from stand Toilet Transfer: Supervision/safety, Cueing for safety, RW Toilet Transfer Details (indicate cue type and reason): simulated with in room mobility;min cues to scan environment Toileting- Clothing Manipulation and Hygiene: Min guard, Sit to/from stand Functional mobility during ADLs: Supervision/safety, Cueing for safety, Rolling walker General ADL Comments: cues for scanning environment during in room mobility;pt limited by Right inattention and visual field cut     Mobility   Overal bed mobility: Needs Assistance Bed Mobility: Supine to Sit Supine to sit: Supervision General bed mobility comments: supervision for safety     Transfers   Overall transfer level: Needs assistance Equipment used: Rolling walker (2 wheeled) Transfers: Sit to/from Stand Sit to Stand: Min guard General transfer comment: minguard for safety       Ambulation / Gait / Stairs / Wheelchair Mobility   Ambulation/Gait Ambulation/Gait assistance: Herbalist (Feet): 180 Feet Assistive device: 1 person hand held assist Gait Pattern/deviations: Step-through pattern, Staggering right, Staggering left General Gait Details: Pt with mildly staggering gait pattern.  Cues to look R when doing pathfinding mission in the hallway.  Significant difficulty problem solving through the path finding needing cues re: room number (I had to tell her multiple times her room number), figuring out where to go and looking R Gait velocity: decreased Gait velocity interpretation: 1.31 - 2.62 ft/sec, indicative of limited community ambulator     Posture / Balance Dynamic Sitting Balance Sitting balance - Comments: close supervision EOB, not tested further, but pt seated on her hand.  Balance Overall balance assessment: Needs assistance Sitting-balance support: Feet supported, No upper extremity supported Sitting balance-Leahy Scale: Good Sitting balance - Comments: close supervision EOB, not tested further, but pt seated on her hand.  Standing balance support: Single extremity supported, During functional activity Standing balance-Leahy Scale: Fair Standing balance comment: reaching outside base of support with single UE support     Special needs/care consideration Skin surgical incision to left side of head        Previous Home Environment (from acute therapy documentation) Living Arrangements: Alone  Lives With: Daughter Available Help at Discharge: Family Type of Home: House Home Layout: One level Home Access: Stairs to enter Entrance Stairs-Rails: None Entrance Stairs-Number of Steps: 2 Bathroom Toilet: Standard Home Care Services: No Additional Comments: Pt is not using an AD at home  currently   Discharge Living Setting Plans for Discharge Living Setting: House Type of Home at Discharge: House Discharge Home Layout: One  level Discharge Home Access: Stairs to enter Entrance Stairs-Rails: None Entrance Stairs-Number of Steps: 2 Discharge Bathroom Shower/Tub: Walk-in shower Discharge Bathroom Toilet: Standard Discharge Bathroom Accessibility: Yes How Accessible: Accessible via walker Does the patient have any problems obtaining your medications?: No   Social/Family/Support Systems Patient Roles: Other (Comment) (has family close by) Contact Information: Daughter: Warren Lacy [phone] Anticipated Caregiver: Wanda + other family members  Anticipated Caregiver's Contact Information: see above Ability/Limitations of Caregiver: Supervision  Caregiver Availability: 24/7 (per daughter, Wanda, the patient's daughters will do day coverage and her son's will cover the nighttime.  Discharge Plan Discussed with Primary Caregiver: Yes, pt and her daughter, Wanda Is Caregiver In Agreement with Plan?: Yes Does Caregiver/Family have Issues with Lodging/Transportation while Pt is in Rehab?: No     Goals Patient/Family Goal for Rehab: PT/OT: Supervision; SLP: Mod I/Supervision Expected length of stay: 5-7 days Pt/Family Agrees to Admission and willing to participate: Yes Program Orientation Provided & Reviewed with Pt/Caregiver Including Roles  & Responsibilities: Yes  Barriers to Discharge: Home environment access/layout, Lack of/limited family support  Barriers to Discharge Comments: steps to enter home; family currently working to address 24/7 Supervision.      Decrease burden of Care through IP rehab admission: NA     Possible need for SNF placement upon discharge: Not anticipated. Pt should progress to supervision level which anticipate pt's family can provide.      Patient Condition: This patient's medical and functional status has changed since the consult dated: 11/28/19 in which the Rehabilitation Physician determined and documented that the patient's condition is appropriate for intensive rehabilitative care in an  inpatient rehabilitation facility. See "History of Present Illness" (above) for medical update. Functional changes are: improvement in transfers from [place] A to Min G, improvement in gait from Min A +2 of 75 feet to Min A 180 feet; Pt has also been evaluated by OT since consult completed with recommendation for CIR. Patient's medical and functional status update has been discussed with the Rehabilitation physician and patient remains appropriate for inpatient rehabilitation. Will admit to inpatient rehab today.    Preadmission Screen Completed By:  Wanda Ross, OT, 12/02/2019 11:41 AM ______________________________________________________________________     Discussed status with Dr. Ranell Patrick on 12/02/19 at 10:50AM and received approval for admission today.   Admission Coordinator:  Wanda Ross, time 10:50AM/Date 12/02/19.                 Cosigned by: Izora Ribas, MD at 12/02/2019 11:50 AM  Revision History                Note Details  Author Wanda Ross, OT File Time 12/02/2019 11:41 AM  Author Type Rehab Admission Coordinator Status Signed  Last Editor Wanda Ross, OT Service Physical Medicine and Rehabilitation

## 2019-12-02 NOTE — Discharge Summary (Addendum)
Physician Discharge Summary  Wanda Ross JKK:[id number] DOB: 1950/12/05 DOA: 11/21/2019  PCP: Fayrene Helper, MD  Admit date: 11/21/2019 Discharge date: 12/02/2019 Consultations: Neurosurgery Admitted From: home Disposition: CIR  Discharge Diagnoses:  Active Problems:   Brain mass   Right sided weakness   Dyslipidemia   Essential hypertension   Steroid-induced hyperglycemia   Leucocytosis   Hospital Course Summary: 69 y.o.female,with history of glaucoma, hyperlipidemia, hypertension, chronic back pain/sciatica was brought to the ED due to intermittent confusion and ataxia. Patient had c/o headache, poor appetite for 2 weeks.  ED Course: Afebrile,CT of the head was obtained which showed ill-defined 3.2 cm mass within the left parietal white matter, compatible malignancy. 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 Extensive vasogenic edema noted throughout the left cerebral hemisphere with significant sulcal effacement and minimal rightward midline shift.Neurosurgeon Dr. Marcello Moores was consulted by ED physician who recommended Decadron, MRI brain with and without contrast and transfer to [place]. Hospital course: Patient admitted to [facility] with neurosurgical consultation.  CT chest abdomen pelvis showed no evidence of primary or metastatic lesions.  Patient underwent craniotomy for resection of parietal/splenial mass by neurosurgery on 6/30.  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  Remains on Decadron taper and accepted to CIR today.  1.  Brain tumor: Highly suspicious for GBM.  Awaiting confirmatory path report from [place].  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  Remains on Decadron taper.  Neurosurgery following along. Per d/w Neurosurgery, Dr Terrial Rhodes 7/2, who  discussed with Dr. Mickeal Skinner and oincology, plan for possible XRT in 2-3 weeks and chemo, this will be determined per the final biopsy results. CIR evaluation appreciated. Speech therapy following for cognitive speech  2.  Abnormal UA: Urine cultures grew insignificant (60 K CFU) of staph epidermidis.  Afebrile.  Antibiotics not initiated.  3.  Hypertension: Continue current medications.  4.  Hyperlipidemia: Continue statins.  5.  Chronic back pain, sciatica: No acute worsening.  Follow-up CIR.  6.  Glaucoma: Resume prior therapy.  7.  Steroid-induced hyperglycemia and leukocytosis:  appear to be improving with tapering doses of steroids. SSI added for now   Discharge Exam:   Vitals:   12/02/19 0435 12/02/19 0544 12/02/19 0741 12/02/19 1132  BP: (!) 142/76 119/65 115/66 (!) 118/59  Pulse: 71 70 66 64  Resp: 14 18 16 16   Temp: 98.8 F (37.1 C)  98.4 F (36.9 C) 98.5 F (36.9 C)  TempSrc: Oral  Oral Oral  SpO2: 100% 99% 99% 98%  Weight:      Height:        General: Pt is alert, awake, not in acute distress Cardiovascular: RRR, S1/S2 +, no rubs, no gallops Respiratory: CTA bilaterally, no wheezing, no rhonchi Abdominal: Soft, NT, ND, bowel sounds + Neurologic: improved right sided weakness. Has some ataxia. Oriented x 2-3 Extremities: no edema, no cyanosis  Discharge Condition:Stable CODE STATUS: Full code Diet recommendation: low salt, carb modified diet Recommendations for Outpatient Follow-up:  1. Follow up with MD: at rehab 2. Follow up with consultants: Neurosurgery as scheduled 3. Please obtain follow up labs including:   Home Health services upon discharge:  Equipment/Devices upon discharge:   Discharge Instructions:  Discharge Instructions    Call MD for:  difficulty breathing, headache or visual disturbances   Complete by: As directed    Call MD for:  extreme fatigue  Complete by: As directed    Call MD for:  persistant dizziness or  light-headedness   Complete by: As directed    Call MD for:  persistant nausea and vomiting   Complete by: As directed    Call MD for:  redness, tenderness, or signs of infection (pain, swelling, redness, odor or green/yellow discharge around incision site)   Complete by: As directed    Call MD for:  severe uncontrolled pain   Complete by: As directed    Call MD for:  temperature >100.4   Complete by: As directed    Diet - low sodium heart healthy   Complete by: As directed    Increase activity slowly   Complete by: As directed    No wound care   Complete by: As directed      Allergies as of 12/02/2019   No Known Allergies     Medication List    STOP taking these medications   B-12 2500 MCG Subl Replaced by: Cyanocobalamin 2500 MCG Tabs   cholecalciferol 25 MCG (1000 UNIT) tablet Commonly known as: VITAMIN D   Denta 5000 Plus 1.1 % Crea dental cream Generic drug: sodium fluoride   triamterene-hydrochlorothiazide 75-50 MG tablet Commonly known as: MAXZIDE     TAKE these medications   acetaminophen 325 MG tablet Commonly known as: TYLENOL Take 2 tablets (650 mg total) by mouth every 4 (four) hours as needed for mild pain (temp > 100.5).   alendronate 70 MG tablet Commonly known as: FOSAMAX Take 1 tablet (70 mg total) by mouth every 7 (seven) days. Take with a full glass of water on an empty stomach.   amLODipine 5 MG tablet Commonly known as: NORVASC Take 1 tablet (5 mg total) by mouth daily.   Calcium 1200 1200-1000 MG-UNIT Chew Chew 1,200 mg by mouth daily.   Centrum Silver Ultra Womens Tabs Take 1 tablet by mouth daily.   Chlorhexidine Gluconate Cloth 2 % Pads Apply 6 each topically daily. Start taking on: December 03, 2019   cloNIDine 0.3 MG tablet Commonly known as: CATAPRES TAKE 1 TABLET BY MOUTH EVERYDAY AT BEDTIME What changed: See the new instructions.   Cyanocobalamin 2500 MCG Tabs Take 2,500 mcg by mouth daily. Start taking on: December 03, 2019 Replaces: B-12 2500 MCG Subl   dexamethasone 2 MG tablet Commonly known as: DECADRON Take 1 tablet (2 mg total) by mouth every 8 (eight) hours.   dexamethasone 2 MG tablet Commonly known as: DECADRON Take 1 tablet (2 mg total) by mouth every 12 (twelve) hours. Start taking on: December 03, 2019   dexamethasone 1 MG tablet Commonly known as: DECADRON Take 1 tablet (1 mg total) by mouth every 12 (twelve) hours. Start taking on: December 05, 2019   dexamethasone 1 MG tablet Commonly known as: DECADRON Take 1 tablet (1 mg total) by mouth daily. Start taking on: December 07, 2019   docusate sodium 100 MG capsule Commonly known as: COLACE Take 1 capsule (100 mg total) by mouth 2 (two) times daily.   dorzolamide-timolol 22.3-6.8 MG/ML ophthalmic solution Commonly known as: COSOPT Place 1 drop into the right eye 2 (two) times daily.   insulin aspart 100 UNIT/ML injection Commonly known as: novoLOG Inject 0-9 Units into the skin 3 (three) times daily with meals.   levETIRAcetam 500 MG tablet Commonly known as: KEPPRA Take 1 tablet (500 mg total) by mouth 2 (two) times daily.   ondansetron 4 MG tablet Commonly known as: ZOFRAN Take  1 tablet (4 mg total) by mouth every 4 (four) hours as needed for nausea or vomiting.   pantoprazole 40 MG tablet Commonly known as: PROTONIX Take 1 tablet (40 mg total) by mouth daily. Start taking on: December 03, 2019   polyethylene glycol 17 g packet Commonly known as: MIRALAX / GLYCOLAX Take 17 g by mouth daily as needed for mild constipation.   pravastatin 10 MG tablet Commonly known as: PRAVACHOL TAKE 1 TABLET BY MOUTH DAILY *DOSE REDUCTION* What changed: See the new instructions.       No Known Allergies    The results of significant diagnostics from this hospitalization (including imaging, microbiology, ancillary and laboratory) are listed below for reference.    Labs: BNP (last 3 results) No results for input(s): BNP in the last 8760  hours. Basic Metabolic Panel: Recent Labs  Lab 11/26/19 0445 11/27/19 0732 11/28/19 0414 11/29/19 0333 11/30/19 0045  NA 139 137 140 139 140  K 4.1 3.6 3.9 3.9 3.5  CL 106 105 105 105 105  CO2 24 22 26 25 25   GLUCOSE 138* 179* 137* 144* 157*  BUN 16 12 14 19 19   CREATININE 0.76 0.74 0.69 0.67 0.72  CALCIUM 8.9 7.6* 8.1* 8.3* 8.4*  MG 2.0  --   --   --   --    Liver Function Tests: Recent Labs  Lab 11/26/19 0445  AST 15  ALT 22  ALKPHOS 45  BILITOT 0.7  PROT 6.0*  ALBUMIN 3.3*   No results for input(s): LIPASE, AMYLASE in the last 168 hours. No results for input(s): AMMONIA in the last 168 hours. CBC: Recent Labs  Lab 11/26/19 0445 11/27/19 0732 11/28/19 0414 11/29/19 0333 11/30/19 0045  WBC 11.0* 20.7* 17.4* 12.9* 12.1*  NEUTROABS 9.7* 19.1*  --   --   --   HGB 14.4 13.6 13.2 13.4 13.9  HCT 41.8 39.7 38.9 39.8 41.1  MCV 82.6 84.1 84.6 83.4 84.6  PLT 390 351 357 347 343   Cardiac Enzymes: No results for input(s): CKTOTAL, CKMB, CKMBINDEX, TROPONINI in the last 168 hours. BNP: Invalid input(s): POCBNP CBG: No results for input(s): GLUCAP in the last 168 hours. D-Dimer No results for input(s): DDIMER in the last 72 hours. Hgb A1c Recent Labs    12/02/19 0842  HGBA1C 6.1*   Lipid Profile No results for input(s): CHOL, HDL, LDLCALC, TRIG, CHOLHDL, LDLDIRECT in the last 72 hours. Thyroid function studies No results for input(s): TSH, T4TOTAL, T3FREE, THYROIDAB in the last 72 hours.  Invalid input(s): FREET3 Anemia work up No results for input(s): VITAMINB12, FOLATE, FERRITIN, TIBC, IRON, RETICCTPCT in the last 72 hours. Urinalysis    Component Value Date/Time   COLORURINE YELLOW 11/21/2019 2248   APPEARANCEUR HAZY (A) 11/21/2019 2248   LABSPEC 1.016 11/21/2019 2248   PHURINE 5.0 11/21/2019 2248   GLUCOSEU NEGATIVE 11/21/2019 2248   HGBUR MODERATE (A) 11/21/2019 2248   BILIRUBINUR NEGATIVE 11/21/2019 2248   KETONESUR NEGATIVE 11/21/2019 2248    PROTEINUR NEGATIVE 11/21/2019 2248   NITRITE NEGATIVE 11/21/2019 2248   LEUKOCYTESUR LARGE (A) 11/21/2019 2248   Sepsis Labs Invalid input(s): PROCALCITONIN,  WBC,  LACTICIDVEN Microbiology Recent Results (from the past 240 hour(s))  Surgical PCR screen     Status: None   Collection Time: 11/26/19  5:45 AM   Specimen: Nasal Mucosa; Nasal Swab  Result Value Ref Range Status   MRSA, PCR NEGATIVE NEGATIVE Final   Staphylococcus aureus NEGATIVE NEGATIVE Final    Comment: (  NOTE) The Xpert SA Assay (FDA approved for NASAL specimens in patients 65 years of age and older), is one component of a comprehensive surveillance program. It is not intended to diagnose infection nor to guide or monitor treatment. Performed at [facility], [place] [address], [place], [place] [address]     Procedures/Studies: DG Chest 2 View  Result Date: 11/22/2019 CLINICAL DATA:  Encephalopathy.  Newly discovered brain mass. EXAM: CHEST - 2 VIEW COMPARISON:  None. FINDINGS: The heart size and mediastinal contours are within normal limits. Both lungs are clear. The visualized skeletal structures are unremarkable. IMPRESSION: No active cardiopulmonary disease. Electronically Signed   By: Ulyses Jarred M.D.   On: 11/22/2019 04:03   CT Head Wo Contrast  Result Date: 11/22/2019 CLINICAL DATA:  Altered level of consciousness for 4 days, dizziness EXAM: CT HEAD WITHOUT CONTRAST TECHNIQUE: Contiguous axial images were obtained from the base of the skull through the vertex without intravenous contrast. COMPARISON:  None. FINDINGS: Brain: There is significant vasogenic edema in the left frontal parietal region. 3.2 cm mass is seen within the left parietal white matter. MRI with and without contrast is recommended for further evaluation. Hypodensity extends along the posterior aspect of the corpus callosum, either extension of the mass or edema. I do not see any definite hemorrhage. There is significant effacement of the left  lateral ventricle, with rightward midline shift measuring up to 4 mm at the septum pellucidum. There is diffuse sulcal effacement throughout the left cerebral hemisphere, greatest along the convexity. No acute extra-axial fluid collections. Vascular: No hyperdense vessel or unexpected calcification. Skull: Normal. Negative for fracture or focal lesion. Sinuses/Orbits: Small metallic densities are seen within the globes bilaterally, likely related to prior glaucoma surgery. Please correlate with surgical history. Otherwise the orbits are unremarkable. Other: None. IMPRESSION: 1. 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 MRI with and without contrast is recommended. 2. Extensive vasogenic edema throughout the left cerebral hemisphere, with significant sulcal effacement and minimal rightward midline shift. Significant effacement of the left lateral ventricle. These results were called by telephone at the time of interpretation on 11/22/2019 at 12:34 am to provider IVA KNAPP , who verbally acknowledged these results. 1. Extensive cerebral edema Electronically Signed   By: Randa Ngo M.D.   On: 11/22/2019 00:34   CT CHEST W CONTRAST  Result Date: 11/23/2019 CLINICAL DATA:  69 y.o. female was brought to the ED due to confusion and gait imbalance. As per patient daughter, patient was having intermittent confusion. Patient denies chest pain, shortness of breath, nausea, vomiting or diarrhea. Mass seen on CT Head and Mri. EXAM: CT CHEST, ABDOMEN, AND PELVIS WITH CONTRAST TECHNIQUE: Multidetector CT imaging of the chest, abdomen and pelvis was performed following the standard protocol during bolus administration of intravenous contrast. CONTRAST:  14mL OMNIPAQUE IOHEXOL 300 MG/ML  SOLN COMPARISON:  Head CT and brain MRI from 11/22/2019 FINDINGS: CT CHEST FINDINGS Cardiovascular: Heart is normal in  size and configuration. No pericardial effusion. No coronary artery calcifications. Great vessels are normal in caliber. No aortic atherosclerosis. Mediastinum/Nodes: No enlarged mediastinal, hilar, or axillary lymph nodes. Thyroid gland, trachea, and esophagus demonstrate no significant findings. Lungs/Pleura: Minor scarring at the base of the left upper lobe lingula. Minimal linear dependent lower lobe opacities consistent with atelectasis. Lungs otherwise clear with no masses or nodules. No pleural effusion or pneumothorax. Musculoskeletal: No fracture or  acute finding. No osteoblastic or osteolytic lesions. CT ABDOMEN PELVIS FINDINGS Hepatobiliary: Liver normal in size and overall attenuation. Small low-density lesion between the anterior segment the right lobe and medial segment of the left, consistent with a cyst. Small amount of focal fat adjacent to the falciform ligament. No other abnormalities. Normal gallbladder. No bile duct dilation. Pancreas: Unremarkable. No pancreatic ductal dilatation or surrounding inflammatory changes. Spleen: Normal in size without focal abnormality. Adrenals/Urinary Tract: No adrenal masses. Kidneys normal in size, orientation and position with symmetric enhancement and excretion. Bilateral low-attenuation renal masses consistent with cysts, largest from the lower pole of the left kidney, 4.5 cm. No renal stones. No hydronephrosis. Normal ureters. Normal bladder. Stomach/Bowel: Small hiatal hernia. Stomach is otherwise within normal limits. Appendix appears normal. No evidence of bowel wall thickening, distention, or inflammatory changes. Vascular/Lymphatic: No significant vascular abnormality. No enlarged lymph nodes. Reproductive: Uterus and bilateral adnexa are unremarkable. Other: No abdominal wall hernia or abnormality. No abdominopelvic ascites. Musculoskeletal: No fracture or acute finding. No osteoblastic or osteolytic lesions. IMPRESSION: 1. No acute findings within the  chest, abdomen or pelvis. 2. No evidence of a primary malignancy within the chest, abdomen or pelvis. 3. Small low-density liver lesion consistent with a cyst. Bilateral renal cysts. Electronically Signed   By: Lajean Manes M.D.   On: 11/23/2019 14:38   MR BRAIN W WO CONTRAST  Result Date: 11/27/2019 CLINICAL DATA:  Postop day 1 following resection of a left parietal tumor (likely high-grade glioma). EXAM: MRI HEAD WITHOUT AND WITH CONTRAST TECHNIQUE: Multiplanar, multiecho pulse sequences of the brain and surrounding structures were obtained without and with intravenous contrast. CONTRAST:  82mL GADAVIST GADOBUTROL 1 MMOL/ML IV SOLN COMPARISON:  11/22/2019 FINDINGS: Brain: Sequelae of interval left parietal craniotomy and tumor resection are identified. There is a resection cavity in the left parietal lobe and splenium of the corpus callosum with a small amount of blood products. A small amount of restricted diffusion is noted along the margins of the resection cavity without a sizable acute infarct. There is a small amount of enhancement along the margins of the resection cavity which is largely curvilinear and likely postoperative. There is more nodular enhancement at the anterior aspect of the resection site at the level of the atrium of the left lateral ventricle which is felt to at least partially reflect choroid plexus. There is moderate nonenhancing T2 hyperintensity/edema in the white matter of the left parietal, occipital, and posterior temporal lobes which has decreased from the prior study. There is persistent nonenhancing T2 hyperintensity which crosses the splenium of the corpus callosum into the right periatrial white matter. There is trace residual rightward midline shift, decreased from prior. There is small volume pneumocephalus, and there is a small extra-axial fluid collection subjacent to the craniotomy. Mild dural thickening and enhancement over the posterior left cerebral convexity is  likely postoperative. Vascular: Major intracranial vascular flow voids are preserved. Skull and upper cervical spine: Left parietal craniotomy with small volume fluid and gas in the scalp soft tissues. Sinuses/Orbits: Left cataract extraction. Paranasal sinuses and mastoid air cells are clear. Other: None. IMPRESSION: 1. Postoperative changes from interval left parietal and splenial tumor resection. Mild enhancement along the margins of the resection cavity as above. This will serve as a baseline for follow-up examinations. 2. Decreased surrounding T2 hyperintensity compatible with decreased edema and likely underlying nonenhancing tumor. Electronically Signed   By: Logan Bores M.D.   On: 11/27/2019 13:58   MR BRAIN W WO  CONTRAST  Result Date: 11/23/2019 CLINICAL DATA:  Intracranial mass EXAM: MRI HEAD WITHOUT AND WITH CONTRAST TECHNIQUE: Multiplanar, multiecho pulse sequences of the brain and surrounding structures were obtained without and with intravenous contrast. CONTRAST:  21mL GADAVIST GADOBUTROL 1 MMOL/ML IV SOLN COMPARISON:  None. FINDINGS: Brain: There is a peripherally and heterogeneously contrast-enhancing mass within the left parietal white matter extending along the corpus callosum splenium. Mass measures 4.8 x 3.3 x 3.0 cm. There is moderate surrounding hyperintense T2-weighted signal a crosses into the right hemisphere. Rightward midline shift measures 3 mm. There is petechial hemorrhage within the mass. Vascular: Normal flow voids. Skull and upper cervical spine: Normal marrow signal. Sinuses/Orbits: Negative. Other: None. IMPRESSION: 1. Heterogeneously contrast-enhancing mass within the left parietal white matter extending along the corpus callosum splenium, most consistent with high-grade glioma. 2. Moderate surrounding hyperintense T2-weighted signal, likely a combination of edema and nonenhancing tumor. 3. 3 mm of rightward midline shift. 4. Intratumoral petechial hemorrhage. Electronically  Signed   By: Ulyses Jarred M.D.   On: 11/23/2019 00:09   [place] Intraoperative  Result Date: 11/26/2019 CLINICAL DATA:  Ultrasound was provided for use by the ordering physician, and a technical charge was applied by the performing facility.  No radiologist interpretation/professional services rendered.   CT ABDOMEN PELVIS W CONTRAST  Result Date: 11/23/2019 CLINICAL DATA:  69 y.o. female was brought to the ED due to confusion and gait imbalance. As per patient daughter, patient was having intermittent confusion. Patient denies chest pain, shortness of breath, nausea, vomiting or diarrhea. Mass seen on CT Head and Mri. EXAM: CT CHEST, ABDOMEN, AND PELVIS WITH CONTRAST TECHNIQUE: Multidetector CT imaging of the chest, abdomen and pelvis was performed following the standard protocol during bolus administration of intravenous contrast. CONTRAST:  141mL OMNIPAQUE IOHEXOL 300 MG/ML  SOLN COMPARISON:  Head CT and brain MRI from 11/22/2019 FINDINGS: CT CHEST FINDINGS Cardiovascular: Heart is normal in size and configuration. No pericardial effusion. No coronary artery calcifications. Great vessels are normal in caliber. No aortic atherosclerosis. Mediastinum/Nodes: No enlarged mediastinal, hilar, or axillary lymph nodes. Thyroid gland, trachea, and esophagus demonstrate no significant findings. Lungs/Pleura: Minor scarring at the base of the left upper lobe lingula. Minimal linear dependent lower lobe opacities consistent with atelectasis. Lungs otherwise clear with no masses or nodules. No pleural effusion or pneumothorax. Musculoskeletal: No fracture or acute finding. No osteoblastic or osteolytic lesions. CT ABDOMEN PELVIS FINDINGS Hepatobiliary: Liver normal in size and overall attenuation. Small low-density lesion between the anterior segment the right lobe and medial segment of the left, consistent with a cyst. Small amount of focal fat adjacent to the falciform ligament. No other abnormalities. Normal  gallbladder. No bile duct dilation. Pancreas: Unremarkable. No pancreatic ductal dilatation or surrounding inflammatory changes. Spleen: Normal in size without focal abnormality. Adrenals/Urinary Tract: No adrenal masses. Kidneys normal in size, orientation and position with symmetric enhancement and excretion. Bilateral low-attenuation renal masses consistent with cysts, largest from the lower pole of the left kidney, 4.5 cm. No renal stones. No hydronephrosis. Normal ureters. Normal bladder. Stomach/Bowel: Small hiatal hernia. Stomach is otherwise within normal limits. Appendix appears normal. No evidence of bowel wall thickening, distention, or inflammatory changes. Vascular/Lymphatic: No significant vascular abnormality. No enlarged lymph nodes. Reproductive: Uterus and bilateral adnexa are unremarkable. Other: No abdominal wall hernia or abnormality. No abdominopelvic ascites. Musculoskeletal: No fracture or acute finding. No osteoblastic or osteolytic lesions. IMPRESSION: 1. No acute findings within the chest, abdomen or pelvis. 2. No evidence of a  primary malignancy within the chest, abdomen or pelvis. 3. Small low-density liver lesion consistent with a cyst. Bilateral renal cysts. Electronically Signed   By: Lajean Manes M.D.   On: 11/23/2019 14:38     Time coordinating discharge: Over 30 minutes  SIGNED:   Guilford Shi, MD  [facility] 12/02/2019, 12:19 PM

## 2019-12-02 NOTE — Progress Notes (Signed)
Inpatient Rehabilitation Medication Review by a Pharmacist  A complete drug regimen review was completed for this patient to identify any potential clinically significant medication issues.  Clinically significant medication issues were identified:  yes   Type of Medication Issue Identified Description of Issue Urgent (address now) Non-Urgent (address on AM team rounds) Plan Plan Accepted by Provider? (Yes / No / Pending AM Rounds)  No Medication Administration End Date  Keppra to be continued post-op x 7 days per Neurosurgery Note 7/1.  Should have stop date of 7/7. Non-Urgent D/C Keppra 7/7 Pending AM rounds    Name of provider notified for urgent issues identified: N/A  Provider Method of Notification:    For non-urgent medication issues to be resolved on team rounds tomorrow morning a [facility] was sent to:    Pharmacist comments:   Time spent performing this drug regimen review (minutes):  [place], Pharm.D., BCPS Clinical Pharmacist  **Pharmacist phone directory can be found on [email or web address] listed under [place].  12/02/2019 6:34 PM

## 2019-12-02 NOTE — Progress Notes (Signed)
Inpatient Rehabilitation-Admissions Coordinator   I have received insurance approval and medical clearance from Dr. Earnest Conroy for admit to CIR today. Notified pt and her daughter [name] of bed offer with both in agreement. Pt understands she will be admitted to a semi-private room and is in agreement. Reviewed insurance benefits letter and consent forms. All questions answered. RN and [facility] team aware of plan for today.   Please call if questions.   Raechel Ache, OTR/L  Rehab Admissions Coordinator  [phone] 12/02/2019 11:37 AM

## 2019-12-02 NOTE — Progress Notes (Signed)
Physical Therapy Treatment Patient Details Name: Wanda Ross MRN: [id number] DOB: Dec 22, 1950 Today's Date: 12/02/2019    History of Present Illness 69 yo female with onset of confusion and changes in balance was admitted, now dx with L parietal glioma, L cerebral hemisphere edema, procedure on 6/30 for neurosurgical resection.  PMHx:  glaucoma, HTN, HLD, Impaired glucose tolerance, multinodal goiter    PT Comments    Pt progressing towards physical therapy goals. She was pleasant and motivated to participate throughout session. Pt continues to have difficulty completing multi-step commands without frequent cues, but appears to be improving with scanning to the right in hallway throughout path finding activity. Pt used the walker today as she already had it with OT for hand-off, however feel she could progress away from the RW in the next session or 2. Continue to recommend CIR level follow-up at d/c. Will continue to follow and progress as able per POC.   Follow Up Recommendations  CIR     Equipment Recommendations  None recommended by PT    Recommendations for Other Services Rehab consult     Precautions / Restrictions Precautions Precautions: Fall Precaution Comments: R inattention Restrictions Weight Bearing Restrictions: No    Mobility  Bed Mobility Overal bed mobility: Needs Assistance Bed Mobility: Supine to Sit     Supine to sit: Supervision     General bed mobility comments: Pt was received standing in room with OT  Transfers Overall transfer level: Needs assistance Equipment used: Rolling walker (2 wheeled) Transfers: Sit to/from Stand Sit to Stand: Min guard;Supervision         General transfer comment: Pt at a supervision level with sit<>stand by end of session.   Ambulation/Gait Ambulation/Gait assistance: Min guard;Min assist Gait Distance (Feet): 350 Feet Assistive device: Rolling walker (2 wheeled) Gait Pattern/deviations: Step-through  pattern;Staggering right;Staggering left Gait velocity: decreased Gait velocity interpretation: <1.8 ft/sec, indicate of risk for recurrent falls General Gait Details: With multi-step commands, pt required frequent cues to complete path finding activity (for example: walk to the end of the hall, turn around and walk back to the yellow sign.) Pt scanning to the R more this session however frequently focused on the L more.    Stairs             Wheelchair Mobility    Modified Rankin (Stroke Patients Only)       Balance Overall balance assessment: Needs assistance Sitting-balance support: Feet supported;No upper extremity supported Sitting balance-Leahy Scale: Good     Standing balance support: Single extremity supported;During functional activity Standing balance-Leahy Scale: Fair Standing balance comment: reaching outside base of support with single UE support                            Cognition Arousal/Alertness: Awake/alert Behavior During Therapy: WFL for tasks assessed/performed Overall Cognitive Status: Impaired/Different from baseline Area of Impairment: Attention;Memory;Following commands;Safety/judgement;Awareness;Problem solving                   Current Attention Level: Selective Memory: Decreased short-term memory Following Commands: Follows one step commands with increased time;Follows multi-step commands inconsistently Safety/Judgement: Decreased awareness of safety;Decreased awareness of deficits Awareness: Emergent Problem Solving: Difficulty sequencing;Slow processing;Decreased initiation;Requires verbal cues General Comments: Pt with decreased attention and short term memory. She was unable to follow multi-step commands and required frequent cues for redirection to task.       Exercises      General Comments  Pertinent Vitals/Pain Pain Assessment: No/denies pain Faces Pain Scale: No hurt    Home Living                       Prior Function            PT Goals (current goals can now be found in the care plan section) Acute Rehab PT Goals Patient Stated Goal: to get better and go home PT Goal Formulation: With patient Time For Goal Achievement: 12/11/19 Potential to Achieve Goals: Good Progress towards PT goals: Progressing toward goals    Frequency    Min 3X/week      PT Plan Current plan remains appropriate    Co-evaluation              AM-PAC PT "6 Clicks" Mobility   Outcome Measure  Help needed turning from your back to your side while in a flat bed without using bedrails?: None Help needed moving from lying on your back to sitting on the side of a flat bed without using bedrails?: None Help needed moving to and from a bed to a chair (including a wheelchair)?: A Little Help needed standing up from a chair using your arms (e.g., wheelchair or bedside chair)?: A Little Help needed to walk in hospital room?: A Little Help needed climbing 3-5 steps with a railing? : A Little 6 Click Score: 20    End of Session Equipment Utilized During Treatment: Gait belt Activity Tolerance: Patient tolerated treatment well Patient left: in chair;with call bell/phone within reach;with chair alarm set Nurse Communication: Mobility status PT Visit Diagnosis: Unsteadiness on feet (R26.81);Ataxic gait (R26.0)     Time: 6067-7034 PT Time Calculation (min) (ACUTE ONLY): 19 min  Charges:  $Gait Training: 8-22 mins                     Rolinda Roan, PT, DPT Acute Rehabilitation Services Pager: [phone] Office: [phone]    Thelma Comp 12/02/2019, 1:46 PM

## 2019-12-02 NOTE — H&P (Signed)
Physical Medicine and Rehabilitation Admission H&P    No chief complaint on file. : HPI: Wanda Ross is a 69 year old right-handed female with history of hyperlipidemia, hypertension.  Patient lives alone 1 level home 2 steps to entry.  Independent prior to admission.  She does have a daughter and 2 sons who can assist as needed.  Presented 11/22/2019 with altered mental status and altered gait.  Admission chemistries with potassium 2.8, glucose 155, urinalysis negative nitrite.  CT/MRI showed contrast-enhancing mass within the left parietal white matter extending along the corpus callosum splenium, most consistent with high-grade glioma.  Moderate surrounding hyperintense T2 weighted signal likely combination of edema and nonenhancing tumor with a 3 mm rightward midline shift.  CT of the chest abdomen pelvis showed no acute findings.  Patient underwent left craniotomy for resection parietal and splenial mass on 11/26/2019 per Dr. Duffy Rhody.  Decadron protocol as indicated.  Maintained on Keppra for seizure prophylaxis.  Placed on Lovenox for DVT prophylaxis 11/27/2019.  Tolerating regular diet.  Therapy evaluations completed and patient was admitted for a comprehensive rehab program.  Review of Systems  Constitutional: Negative for chills and fever.  HENT: Negative for hearing loss.   Eyes: Negative for blurred vision and double vision.  Respiratory: Negative for cough and shortness of breath.   Cardiovascular: Negative for chest pain, palpitations and leg swelling.  Gastrointestinal: Positive for constipation. Negative for heartburn, nausea and vomiting.  Genitourinary: Negative for dysuria, flank pain and hematuria.  Musculoskeletal: Positive for joint pain and myalgias.  Skin: Negative for rash.  Neurological: Positive for weakness and headaches.       Gait abnormality  All other systems reviewed and are negative.  Past Medical History:  Diagnosis Date  . Glaucoma   .  Hyperlipidemia 2010  . Hypertension 1990  . IGT (impaired glucose tolerance) 05/11/2010  . Multinodular goiter 2010   normal function   Past Surgical History:  Procedure Laterality Date  . APPLICATION OF CRANIAL NAVIGATION Left 11/26/2019   Procedure: APPLICATION OF CRANIAL NAVIGATION;  Surgeon: [name], MD;  Location: [place];  Service: Neurosurgery;  Laterality: Left;  APPLICATION OF CRANIAL NAVIGATION  . COLONOSCOPY N/A 06/16/2016   Procedure: COLONOSCOPY;  Surgeon: Danie Binder, MD;  [facility];  Service: Endoscopy;  Laterality: N/A;  9:30 AM  . COLONOSCOPY N/A 10/22/2019   Procedure: COLONOSCOPY;  Surgeon: Daneil Dolin, MD;  [facility];  Service: Endoscopy;  Laterality: N/A;  9:30  . CRANIOTOMY Left 11/26/2019   Procedure: LEFT PARIETAL CRANIOTOMY FOR RESECTION OF TUMOR;  Surgeon: [name], MD;  Location: [place];  Service: Neurosurgery;  Laterality: Left;  LEFT PARIETAL CRANIOTOMY FOR RESECTION OF TUMOR  . EYE SURGERY  2017   3 on left eye 1 on right eye for glaucoma by Dr. Venetia Maxon   . EYE SURGERY  2018   Cataract Surgery by Dr. Arlina Robes   . OPERATIVE ULTRASOUND N/A 11/26/2019   Procedure: OPERATIVE ULTRASOUND;  Surgeon: [name], MD;  Location: [place];  Service: Neurosurgery;  Laterality: N/A;  OPERATIVE ULTRASOUND   Family History  Problem Relation Age of Onset  . Dementia Father   . Dementia Mother   . Diabetes Mother   . Hypertension Mother   . Breast cancer Sister   . Hypertension Sister   . Diabetes Brother    Social History:  reports that she has never smoked. She has never used smokeless tobacco. She  reports that she does not drink alcohol and does not use drugs. Allergies: No Known Allergies Medications Prior to Admission  Medication Sig Dispense Refill  . acetaminophen (TYLENOL) 325 MG tablet Take 2 tablets (650 mg total) by mouth every 4 (four) hours as needed for mild pain (temp > 100.5).    Marland Kitchen alendronate  (FOSAMAX) 70 MG tablet Take 1 tablet (70 mg total) by mouth every 7 (seven) days. Take with a full glass of water on an empty stomach. 4 tablet 11  . amLODipine (NORVASC) 5 MG tablet Take 1 tablet (5 mg total) by mouth daily. 90 tablet 1  . Calcium Carbonate-Vit D-Min (CALCIUM 1200) 1200-1000 MG-UNIT CHEW Chew 1,200 mg by mouth daily.      Derrill Memo ON 12/03/2019] Chlorhexidine Gluconate Cloth 2 % PADS Apply 6 each topically daily.    . cloNIDine (CATAPRES) 0.3 MG tablet TAKE 1 TABLET BY MOUTH EVERYDAY AT BEDTIME (Patient taking differently: Take 0.3 mg by mouth at bedtime. ) 90 tablet 0  . [START ON 12/03/2019] Cyanocobalamin (VITAMIN B-12) 2500 MCG TABS Take 2,500 mcg by mouth daily.    Derrill Memo ON 12/05/2019] dexamethasone (DECADRON) 1 MG tablet Take 1 tablet (1 mg total) by mouth every 12 (twelve) hours.    Derrill Memo ON 12/07/2019] dexamethasone (DECADRON) 1 MG tablet Take 1 tablet (1 mg total) by mouth daily.    Marland Kitchen dexamethasone (DECADRON) 2 MG tablet Take 1 tablet (2 mg total) by mouth every 8 (eight) hours.    Derrill Memo ON 12/03/2019] dexamethasone (DECADRON) 2 MG tablet Take 1 tablet (2 mg total) by mouth every 12 (twelve) hours.    . docusate sodium (COLACE) 100 MG capsule Take 1 capsule (100 mg total) by mouth 2 (two) times daily. 10 capsule 0  . dorzolamide-timolol (COSOPT) 22.3-6.8 MG/ML ophthalmic solution Place 1 drop into the right eye 2 (two) times daily.     . insulin aspart (NOVOLOG) 100 UNIT/ML injection Inject 0-9 Units into the skin 3 (three) times daily with meals. 10 mL 11  . levETIRAcetam (KEPPRA) 500 MG tablet Take 1 tablet (500 mg total) by mouth 2 (two) times daily.    . Multiple Vitamins-Minerals (CENTRUM SILVER ULTRA WOMENS) TABS Take 1 tablet by mouth daily.     . ondansetron (ZOFRAN) 4 MG tablet Take 1 tablet (4 mg total) by mouth every 4 (four) hours as needed for nausea or vomiting. 20 tablet 0  . [START ON 12/03/2019] pantoprazole (PROTONIX) 40 MG tablet Take 1 tablet (40 mg total)  by mouth daily.    . polyethylene glycol (MIRALAX / GLYCOLAX) 17 g packet Take 17 g by mouth daily as needed for mild constipation. 14 each 0  . pravastatin (PRAVACHOL) 10 MG tablet TAKE 1 TABLET BY MOUTH DAILY *DOSE REDUCTION* 90 tablet 1    Drug Regimen Review Drug regimen was reviewed and remains appropriate with no significant issues identified  Home: Home Living Family/patient expects to be discharged to:: Inpatient rehab Living Arrangements: Alone Available Help at Discharge: Family Type of Home: House Home Access: Stairs to enter Technical brewer of Steps: 2 Entrance Stairs-Rails: None Home Layout: One level Biochemist, clinical: Standard Home Equipment: Environmental consultant - 2 wheels Additional Comments: Pt is not using an AD at home currently  Lives With: Daughter   Functional History: Prior Function Level of Independence: Independent Comments: pt independent, walks multiple miles per day in her neighborhood.   Functional Status:  Mobility: Bed Mobility Overal bed mobility: Needs Assistance Bed  Mobility: Supine to Sit Supine to sit: HOB elevated, Supervision General bed mobility comments: Supervision for safety Transfers Overall transfer level: Needs assistance Equipment used: Rolling walker (2 wheeled) Transfers: Sit to/from Stand Sit to Stand: Min guard General transfer comment: min guard assist for safety Ambulation/Gait Ambulation/Gait assistance: Min assist Gait Distance (Feet): 180 Feet Assistive device: 1 person hand held assist Gait Pattern/deviations: Step-through pattern, Staggering right, Staggering left General Gait Details: Pt with mildly staggering gait pattern.  Cues to look R when doing pathfinding mission in the hallway.  Significant difficulty problem solving through the path finding needing cues re: room number (I had to tell her multiple times her room number), figuring out where to go and looking R Gait velocity: decreased Gait velocity  interpretation: 1.31 - 2.62 ft/sec, indicative of limited community ambulator  ADL: ADL Overall ADL's : Needs assistance/impaired Eating/Feeding: Set up, Supervision/ safety, Sitting Grooming: Wash/dry hands, Wash/dry face, Oral care, Set up, Supervision/safety, Standing, Cueing for sequencing, Cueing for compensatory techniques Grooming Details (indicate cue type and reason): Grooming at sink in standing with set up and supervision. Pt needed max verbal cues for sequencing, problem solving, and visual scanning. Upper Body Bathing: Supervision/ safety, Set up, Sitting Lower Body Bathing: Minimal assistance, Sit to/from stand Upper Body Dressing : Minimal assistance, Sitting Lower Body Dressing: Minimal assistance, Sit to/from stand Toilet Transfer: Supervision/safety, Cueing for safety, RW Toilet Transfer Details (indicate cue type and reason): simulated transfer to recliner Toileting- Clothing Manipulation and Hygiene: Minimal assistance, Sit to/from stand Functional mobility during ADLs: Supervision/safety, Cueing for safety, Rolling walker, Cueing for sequencing General ADL Comments: Supervision for ADLs with RW. Pt needed verbal cues for visual scanning and safety with innatention and field cut on R side  Cognition: Cognition Overall Cognitive Status: Impaired/Different from baseline Arousal/Alertness: Awake/alert Orientation Level: Oriented X4 Attention: Sustained Sustained Attention: Impaired Sustained Attention Impairment: Verbal basic Memory: Impaired Memory Impairment: Storage deficit, Retrieval deficit, Decreased recall of new information, Decreased short term memory Decreased Short Term Memory: Verbal basic Awareness: Impaired Awareness Impairment: Intellectual impairment, Emergent impairment, Anticipatory impairment Problem Solving: Impaired Problem Solving Impairment: Verbal basic, Functional complex Safety/Judgment: Impaired Cognition Arousal/Alertness:  Awake/alert Behavior During Therapy: WFL for tasks assessed/performed Overall Cognitive Status: Impaired/Different from baseline Area of Impairment: Attention, Memory, Following commands, Safety/judgement, Awareness, Problem solving Current Attention Level: Sustained Memory: Decreased short-term memory Following Commands: Follows one step commands inconsistently Safety/Judgement: Decreased awareness of safety, Decreased awareness of deficits Awareness: Intellectual Problem Solving: Difficulty sequencing, Slow processing, Decreased initiation, Requires verbal cues General Comments: Pt with decreased attention and memory noted during functional ADL activity and visual scanning activity. Needing max verbal cues to scan environment and sequence.  Physical Exam: Blood pressure 126/76, pulse 66, temperature 98 F (36.7 C), temperature source Tympanic, resp. rate 18, height 5\' 8"  (1.727 m), weight 73.8 kg, SpO2 98 %.   General: Alert and oriented x 3, No apparent distress HEENT: PERRLA, EOMI, sclera anicteric, oral mucosa pink and moist, dentition intact, ext ear canals clear,  Neck: Supple without JVD or lymphadenopathy Heart: Reg rate and rhythm. No murmurs rubs or gallops Chest: CTA bilaterally without wheezes, rales, or rhonchi; no distress Abdomen: Soft, non-tender, non-distended, bowel sounds positive. Extremities: No clubbing, cyanosis, or edema. Pulses are 2+ Skin: Craniotomy Incision C/D/I Neuro: Patient is alert in no acute distress.  Makes eye contact with examiner. Has right visual field cut.  Follows simple commands, but has difficulty with multi-step commands.  Impaired attention and short term memory.  She does provide her name age and date of birth with some delay in processing.  She needed just a moment to give correct name of the hospital.  Psych: Pt's affect is appropriate. Pt is cooperative. Pleasant and motivated.   Results for orders placed or performed during the hospital  encounter of 11/21/19 (from the past 48 hour(s))  Hemoglobin A1c     Status: Abnormal   Collection Time: 12/02/19  8:42 AM  Result Value Ref Range   Hgb A1c MFr Bld 6.1 (H) 4.8 - 5.6 %    Comment: (NOTE) Pre diabetes:          5.7%-6.4%  Diabetes:              >6.4%  Glycemic control for   <7.0% adults with diabetes    Mean Plasma Glucose 128.37 mg/dL    Comment: Performed at [facility] [address], [place], [place] [address]  Glucose, capillary     Status: Abnormal   Collection Time: 12/02/19  1:00 PM  Result Value Ref Range   Glucose-Capillary 189 (H) 70 - 99 mg/dL    Comment: Glucose reference range applies only to samples taken after fasting for at least 8 hours.   Comment 1 POST MEAL    Comment 2 Notify RN    No results found.     Medical Problem List and Plan: 1.  Altered mental status with gait abnormality secondary to glioma.  Status post left craniotomy resection of tumor 11/26/2019.  Decadron protocol.  -patient may shower but craniotomy incision must be covered.   -ELOS/Goals: modI 10-12 days 2.  Antithrombotics: -DVT/anticoagulation: Lovenox initiated 11/27/2019  -antiplatelet therapy: N/A 3. Pain Management: Hydrocodone 4. Mood: Provide motional support  -antipsychotic agents: N/A 5. Neuropsych: This patient is capable of making decisions on her own behalf. 6. Skin/Wound Care: Routine skin checks 7. Fluids/Electrolytes/Nutrition: Routine in and outs with follow-up chemistries 8.  Seizure prophylaxis.  Keppra 500 mg twice daily x1 week (7/1-7/7) 9.  Hypertension.  Clonidine 0.3 mg nightly, Norvasc 5 mg daily.  Monitor with increased mobility. Has been low-normal. Can consider decreasing amlodipine to 2.5mg .  10.  Hyperlipidemia.  Pravachol 11.  Steroid-induced hyperglycemia.  SSI. 12. 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 F/u pathology. Plan for radiation and chemotherapy- neuro-onc and rad onc are following.    Marlowe Shores, PA-C  I have personally performed a  face to face diagnostic evaluation, including, but not limited to relevant history and physical exam findings, of this patient and developed relevant assessment and plan.  Additionally, I have reviewed and concur with the physician assistant's documentation above.  The patient's status has not changed. The original post admission physician evaluation remains appropriate, and any changes from the pre-admission screening or documentation from the acute chart are noted above.   Leeroy Cha, MD

## 2019-12-03 ENCOUNTER — Inpatient Hospital Stay (HOSPITAL_COMMUNITY): Payer: Medicare PPO | Admitting: Physical Therapy

## 2019-12-03 ENCOUNTER — Inpatient Hospital Stay (HOSPITAL_COMMUNITY): Payer: Medicare PPO | Admitting: Speech Pathology

## 2019-12-03 ENCOUNTER — Inpatient Hospital Stay (HOSPITAL_COMMUNITY): Payer: Medicare PPO | Admitting: Occupational Therapy

## 2019-12-03 DIAGNOSIS — I10 Essential (primary) hypertension: Secondary | ICD-10-CM

## 2019-12-03 DIAGNOSIS — D72828 Other elevated white blood cell count: Secondary | ICD-10-CM

## 2019-12-03 DIAGNOSIS — R739 Hyperglycemia, unspecified: Secondary | ICD-10-CM

## 2019-12-03 DIAGNOSIS — T380X5A Adverse effect of glucocorticoids and synthetic analogues, initial encounter: Secondary | ICD-10-CM

## 2019-12-03 DIAGNOSIS — Z298 Encounter for other specified prophylactic measures: Secondary | ICD-10-CM

## 2019-12-03 DIAGNOSIS — R7303 Prediabetes: Secondary | ICD-10-CM

## 2019-12-03 LAB — COMPREHENSIVE METABOLIC PANEL
ALT: 45 U/L — ABNORMAL HIGH (ref 0–44)
AST: 19 U/L (ref 15–41)
Albumin: 3.2 g/dL — ABNORMAL LOW (ref 3.5–5.0)
Alkaline Phosphatase: 51 U/L (ref 38–126)
Anion gap: 11 (ref 5–15)
BUN: 25 mg/dL — ABNORMAL HIGH (ref 8–23)
CO2: 26 mmol/L (ref 22–32)
Calcium: 8.9 mg/dL (ref 8.9–10.3)
Chloride: 101 mmol/L (ref 98–111)
Creatinine, Ser: 0.81 mg/dL (ref 0.44–1.00)
GFR calc Af Amer: >60 mL/min (ref >60)
GFR calc non Af Amer: >60 mL/min (ref >60)
Glucose, Bld: 145 mg/dL — ABNORMAL HIGH (ref 70–99)
Potassium: 3.8 mmol/L (ref 3.5–5.1)
Sodium: 138 mmol/L (ref 135–145)
Total Bilirubin: 0.5 mg/dL (ref 0.3–1.2)
Total Protein: 5.9 g/dL — ABNORMAL LOW (ref 6.5–8.1)

## 2019-12-03 LAB — CBC WITH DIFFERENTIAL/PLATELET
Abs Immature Granulocytes: 0.64 10*3/uL — ABNORMAL HIGH (ref 0.00–0.07)
Basophils Absolute: 0 10*3/uL (ref 0.0–0.1)
Basophils Relative: 0 %
Eosinophils Absolute: 0 10*3/uL (ref 0.0–0.5)
Eosinophils Relative: 0 %
HCT: 44.2 % (ref 36.0–46.0)
Hemoglobin: 15.1 g/dL — ABNORMAL HIGH (ref 12.0–15.0)
Immature Granulocytes: 3 %
Lymphocytes Relative: 2 %
Lymphs Abs: 0.5 10*3/uL — ABNORMAL LOW (ref 0.7–4.0)
MCH: 28.5 pg (ref 26.0–34.0)
MCHC: 34.2 g/dL (ref 30.0–36.0)
MCV: 83.6 fL (ref 80.0–100.0)
Monocytes Absolute: 1.1 10*3/uL — ABNORMAL HIGH (ref 0.1–1.0)
Monocytes Relative: 5 %
Neutro Abs: 21.5 10*3/uL — ABNORMAL HIGH (ref 1.7–7.7)
Neutrophils Relative %: 90 %
Platelets: 383 10*3/uL (ref 150–400)
RBC: 5.29 MIL/uL — ABNORMAL HIGH (ref 3.87–5.11)
RDW: 13.1 % (ref 11.5–15.5)
WBC: 23.8 10*3/uL — ABNORMAL HIGH (ref 4.0–10.5)
nRBC: 0 % (ref 0.0–0.2)

## 2019-12-03 LAB — GLUCOSE, CAPILLARY
Glucose-Capillary: 102 mg/dL — ABNORMAL HIGH (ref 70–99)
Glucose-Capillary: 85 mg/dL (ref 70–99)
Glucose-Capillary: 125 mg/dL — ABNORMAL HIGH (ref 70–99)

## 2019-12-03 NOTE — Patient Care Conference (Signed)
Inpatient RehabilitationTeam Conference and Plan of Care Update Date: 12/03/2019   Time: 1:46 PM    Patient Name: Elmer Record Number: [id number]  Date of Birth: 1950/09/20 Sex: Female         Room/Bed: 4W15C/4W15C-01 Payor Info: Payor: [employer] MEDICARE / Plan: [employer] MEDICARE CHOICE PPO / Product Type: *No Product type* /    Admit Date/Time:  12/02/2019  4:03 PM  Primary Diagnosis:  Glioma [facility])  Hospital Problems: Principal Problem:   Glioma [facility]) Active Problems:   Prediabetes   Seizure prophylaxis    Expected Discharge Date: Expected Discharge Date: 12/10/19  Team Members Present: Physician leading conference: Dr. Delice Lesch Care Coodinator Present: Dorien Chihuahua, RN, BSN, CRRN;Christina Sampson Goon, [place] Nurse Present: Other (comment) Ludwig Clarks, RN) PT Present: Deniece Ree, PT OT Present: Lillia Corporal, OT SLP Present: Jettie Booze, CF-SLP PPS Coordinator present : Ileana Ladd, Burna Mortimer, SLP     Current Status/Progress Goal Weekly Team Focus  Bowel/Bladder   (P) Patient is continent of bladder/bowel, has schedule and prn laxative/stool softeners, LBM 12/02/19  (P) Maintain normalcy  (P) Assess QS/PRN toileting needs and address basic needs in a timely manner to prevent constipation or GI/GU issues   Swallow/Nutrition/ Hydration             ADL's             Mobility             Communication             Safety/Cognition/ Behavioral Observations  eval pending         Pain             Skin               Team Discussion:  Discharge Planning/Teaching Needs:       TBD  Current Update:    Current Barriers to Discharge:  XRT and Chemo in the future pending. WBC elevations and HTN issues Possible Resolutions to Barriers: MD re-check labs and adjust BP meds.  Patient on target to meet rehab goals: yes  *See Care Plan and progress notes for long and short-term goals.   Revisions to Treatment Plan:  Schedule family  education to review right inattention deficits, STMD and poor awareness    Medical Summary Current Status: Altered mental status with gait abnormality secondary to glioma.  Status post left craniotomy resection of tumor 11/26/2019. Weekly Focus/Goal: Improve mobility, optimize BP meds, follow pathology  Barriers to Discharge: Medical stability   Possible Resolutions to Barriers: Therapies, optimize BP meds, follow labs, follow up on pathology   Continued Need for Acute Rehabilitation Level of Care: The patient requires daily medical management by a physician with specialized training in physical medicine and rehabilitation for the following reasons: Direction of a multidisciplinary physical rehabilitation program to maximize functional independence : Yes Medical management of patient stability for increased activity during participation in an intensive rehabilitation regime.: Yes Analysis of laboratory values and/or radiology reports with any subsequent need for medication adjustment and/or medical intervention. : Yes   I attest that I was present, lead the team conference, and concur with the assessment and plan of the team.   Dorien Chihuahua B 12/03/2019, 1:46 PM

## 2019-12-03 NOTE — Progress Notes (Signed)
Patient ID: Wanda Ross, female   DOB: 03/10/1951, 69 y.o.   MRN: [id number]  Team Conference Report to Patient/Family  Team Conference discussion was reviewed with the patient and caregiver, including goals, any changes in plan of care and target discharge date.  Patient and caregiver express understanding and are in agreement.  The patient has a target discharge date of 12/10/19.  Dyanne Iha 12/03/2019, 1:56 PM

## 2019-12-03 NOTE — Progress Notes (Signed)
Inpatient Rehabilitation Center Individual Statement of Services  Patient Name:  Wanda Ross  Date:  12/03/2019  Welcome to the [facility].  Our goal is to provide you with an individualized program based on your diagnosis and situation, designed to meet your specific needs.  With this comprehensive rehabilitation program, you will be expected to participate in at least 3 hours of rehabilitation therapies Monday-Friday, with modified therapy programming on the weekends.  Your rehabilitation program will include the following services:  Physical Therapy (PT), Occupational Therapy (OT), Speech Therapy (ST), 24 hour per day rehabilitation nursing, Therapeutic Recreaction (TR), Neuropsychology, Care Coordinator, Rehabilitation Medicine, Nutrition Services, Pharmacy Services and Other  Weekly team conferences will be held on Wednesdays to discuss your progress.  Your Inpatient Rehabilitation Care Coordinator will talk with you frequently to get your input and to update you on team discussions.  Team conferences with you and your family in attendance may also be held.  Expected length of stay: 5-7 Days  Overall anticipated outcome: Supervision  Depending on your progress and recovery, your program may change. Your Inpatient Rehabilitation Care Coordinator will coordinate services and will keep you informed of any changes. Your Inpatient Rehabilitation Care Coordinator's name and contact numbers are listed  below.  The following services may also be recommended but are not provided by the [facility]:    [facility] will be made to provide these services after discharge if needed.  Arrangements include referral to agencies that provide these services.  Your insurance has been verified to be:  [employer] Your primary doctor is:  Tula Nakayama, MD  Pertinent information will  be shared with your doctor and your insurance company.  Inpatient Rehabilitation Care Coordinator:  Erlene Quan, [place] or [phone]  Information discussed with and copy given to patient by: Dyanne Iha, 12/03/2019, 9:03 AM

## 2019-12-03 NOTE — Progress Notes (Signed)
Occupational Therapy Assessment and Plan  Patient Details  Name: Wanda Ross MRN: [id number] Date of Birth: 08-Sep-1950  OT Diagnosis: abnormal posture, ataxia, cognitive deficits and disturbance of vision Rehab Potential: Rehab Potential (ACUTE ONLY): Good ELOS: 6-9 days   Today's Date: 12/03/2019 OT Individual Time: 1300-1401 OT Individual Time Calculation (min): 61 min     Hospital Problem: Principal Problem:   Glioma ([place])   Past Medical History:  Past Medical History:  Diagnosis Date  . Glaucoma   . Hyperlipidemia 2010  . Hypertension 1990  . IGT (impaired glucose tolerance) 05/11/2010  . Multinodular goiter 2010   normal function   Past Surgical History:  Past Surgical History:  Procedure Laterality Date  . APPLICATION OF CRANIAL NAVIGATION Left 11/26/2019   Procedure: APPLICATION OF CRANIAL NAVIGATION;  Surgeon: [name], MD;  Location: [facility];  Service: Neurosurgery;  Laterality: Left;  APPLICATION OF CRANIAL NAVIGATION  . COLONOSCOPY N/A 06/16/2016   Procedure: COLONOSCOPY;  Surgeon: Danie Binder, MD;  [facility];  Service: Endoscopy;  Laterality: N/A;  9:30 AM  . COLONOSCOPY N/A 10/22/2019   Procedure: COLONOSCOPY;  Surgeon: Daneil Dolin, MD;  [facility];  Service: Endoscopy;  Laterality: N/A;  9:30  . CRANIOTOMY Left 11/26/2019   Procedure: LEFT PARIETAL CRANIOTOMY FOR RESECTION OF TUMOR;  Surgeon: [name], MD;  Location: [facility];  Service: Neurosurgery;  Laterality: Left;  LEFT PARIETAL CRANIOTOMY FOR RESECTION OF TUMOR  . EYE SURGERY  2017   3 on left eye 1 on right eye for glaucoma by Dr. Venetia Maxon   . EYE SURGERY  2018   Cataract Surgery by Dr. Arlina Robes   . OPERATIVE ULTRASOUND N/A 11/26/2019   Procedure: OPERATIVE ULTRASOUND;  Surgeon: [name], MD;  Location: [facility];  Service: Neurosurgery;  Laterality: N/A;  OPERATIVE ULTRASOUND    Assessment & Plan Clinical Impression: Wanda Ross is a  69 year old right-handed female with history of hyperlipidemia, hypertension. Patient lives alone 1 level home 2 steps to entry. Independent prior to admission. She does have a daughter and 2 sons who can assist as needed. Presented 11/22/2019 with altered mental status and altered gait. Admission chemistries with potassium 2.8, glucose 155, urinalysis negative nitrite. CT/MRI showed contrast-enhancing mass within the left parietal white matter extending along the corpus callosum splenium, most consistent with high-grade glioma. Moderate surrounding hyperintense T2 weighted signal likely combination of edema and nonenhancing tumor with a 3 mm rightward midline shift. CT of the chest abdomen pelvis showed no acute findings. Patient underwent left craniotomy for resection parietal and splenial mass on 11/26/2019 per Dr. Duffy Rhody. Decadron protocol as indicated. Maintained on Keppra for seizure prophylaxis. Placed on Lovenox for DVT prophylaxis 11/27/2019. Tolerating regular diet. Therapy evaluations completed and patientis to beadmitted for a comprehensive rehab program on 12/02/19.  Patient currently requires min with basic self-care skills secondary to decreased cardiorespiratoy endurance, motor apraxia, decreased coordination and decreased motor planning, decreased visual perceptual skills, field cut and hemianopsia, decreased attention to right and decreased problem solving and decreased safety awareness.  Prior to hospitalization, patient could complete ADLs and IADLs with independent .  Patient will benefit from skilled intervention to increase independence with basic self-care skills and increase level of independence with iADL prior to discharge home with care partner.  Anticipate patient will require intermittent supervision and follow up home health.  OT - End of Session Activity Tolerance: Tolerates 30+ min activity without fatigue Endurance Deficit:  Yes OT Assessment Rehab  Potential (ACUTE ONLY): Good OT Patient demonstrates impairments in the following area(s): Balance;Behavior;Cognition;Endurance;Motor;Pain;Perception;Safety;Sensory;Vision OT Basic ADL's Functional Problem(s): Eating;Grooming;Bathing;Toileting;Dressing OT Advanced ADL's Functional Problem(s): Simple Meal Preparation;Laundry OT Transfers Functional Problem(s): Toilet;Tub/Shower OT Additional Impairment(s): Fuctional Use of Upper Extremity OT Plan OT Intensity: Minimum of 1-2 x/day, 45 to 90 minutes OT Frequency: 5 out of 7 days OT Treatment/Interventions: Balance/vestibular training;Discharge planning;Functional electrical stimulation;Pain management;Therapeutic Activities;Self Care/advanced ADL retraining;UE/LE Coordination activities;Cognitive remediation/compensation;Disease mangement/prevention;Functional mobility training;Patient/family education;Therapeutic Exercise;Visual/perceptual remediation/compensation;Community reintegration;DME/adaptive equipment instruction;Psychosocial support;Splinting/orthotics;UE/LE Strength taining/ROM;Wheelchair propulsion/positioning OT Self Feeding Anticipated Outcome(s): Supervision OT Basic Self-Care Anticipated Outcome(s): Supervision OT Toileting Anticipated Outcome(s): Supervision OT Bathroom Transfers Anticipated Outcome(s): Supervision OT Recommendation Recommendations for Other Services: None Patient destination: Home Follow Up Recommendations: Outpatient OT Equipment Recommended: To be determined   Skilled Therapeutic Intervention Treatment session with focus on ADL retraining and R attention and visual scanning to improve independence during functional mobility. Pt received seated in w/c reporting no pain reporting need to toilet. Completed ambulatory transfer to toilet with handheld assist with minA to attend to R visual field. Completed toileting with CGA. Ambulated to TTB with handheld assist. Pt doffed UB clothes with supervision and LB  clothes with minA to support standing balance. Showered with CGA due to standing balance during perineal care with multimodal cueing to attend to R side of body while bathing. Ambulated to w/c with minA and handheld assist, completing grooming tasks and oral care seated at sink with supervision. Pt with decreased safety awareness requiring multimodal cues to prevent pt from standing before OTS was prepared. Pt presented with mild problem-solving deficits requiring moderate multimodal cues to sequence tasks. Engaged in cleaning up towels in room with CGA to promote dynamic standing balance and improve visual scanning to the R visual field to increase IADL independence. Pt able to pick items off of floor with CGA for balance. RN requested pt end session in nurse's station due to room maintenance. Ended session with pt seated at the nurses station with seat alarm on.  OT Evaluation Precautions/Restrictions  Precautions Precautions: Fall Precaution Comments: R inattention Restrictions Weight Bearing Restrictions: No Pain Pain Assessment Pain Scale: 0-10 Pain Score: 0-No pain Home Living/Prior Functioning Home Living Family/patient expects to be discharged to:: Private residence Living Arrangements: Alone Available Help at Discharge: Family, Available 24 hours/day Type of Home: House Home Access: Stairs to enter [employer] of Steps: 2 Entrance Stairs-Rails: None Home Layout: One level Bathroom Shower/Tub: Other (comment) (step-in shower) Bathroom Toilet: Standard Additional Comments: Pt is not using an AD at home currently  Lives With: Alone IADL History Homemaking Responsibilities: Yes Meal Prep Responsibility: Primary Laundry Responsibility: Primary Cleaning Responsibility: Primary Shopping Responsibility: Primary Current License: Yes Mode of Transportation: Car Occupation: Retired Type of Occupation: principal Prior Function Level of Independence: Independent with  transfers, Independent with homemaking with ambulation, Independent with basic ADLs  Able to Take Stairs?: Yes Driving: Yes Vocation: Retired Comments: Surveyor, quantity ADL ADL Eating: Independent Where Assessed-Eating: Wheelchair Grooming: Supervision/safety Where Assessed-Grooming: Standing at sink, Sitting at sink Upper Body Bathing: Supervision/safety Where Assessed-Upper Body Bathing: Shower Lower Body Bathing: Contact-Guard Where Assessed-Lower Body Bathing: Shower Upper Body Dressing: Supervision/safety Where Assessed-Upper Body Dressing: Wheelchair Lower Body Dressing: Minimal assistance Where Assessed-Lower Body Dressing: Wheelchair Toileting: Therapist, music: Minimal Print production planner Method: Counselling psychologist: Energy manager: Minimal assistance Social research officer, government Method: Heritage manager: Civil engineer, contracting with back Vision Baseline Vision/History: Wears glasses  Wears Glasses: At all times Patient Visual Report: No change from baseline Vision Assessment?: Vision impaired- to be further tested in functional context Eye Alignment: Within Functional Limits Ocular Range of Motion: Within Functional Limits Alignment/Gaze Preference: Gaze left Tracking/Visual Pursuits: Able to track stimulus in all quads without difficulty Convergence: Within functional limits Visual Fields: Right homonymous hemianopsia;Right visual field deficit;Other (comment) (pt required cues to scan to objects to R side) Additional Comments: difficulty scanning for objects in R visual field Perception  Perception: Impaired Inattention/Neglect: Does not attend to right visual field;Impaired-to be further tested in functional context Praxis Praxis: Intact Cognition Overall Cognitive Status: Impaired/Different from baseline Arousal/Alertness: Awake/alert Orientation Level: Person;Place;Situation Person:  Oriented Place: Oriented Situation: Oriented Year: 2021 Month: June Day of Week: Correct Memory: Impaired Memory Impairment: Retrieval deficit Decreased Short Term Memory: Verbal basic Immediate Memory Recall: Sock;Blue;Bed Memory Recall Sock: Without Cue Memory Recall Blue: Without Cue Memory Recall Bed: Without Cue Attention: Sustained Sustained Attention: Appears intact Awareness: Impaired Awareness Impairment: Emergent impairment Problem Solving: Impaired Problem Solving Impairment: Functional basic;Verbal complex Executive Function: Organizing Reasoning: Impaired Reasoning Impairment: Verbal complex Organizing: Impaired Organizing Impairment: Functional complex Safety/Judgment: Impaired Sensation Sensation Light Touch: Appears Intact (in BUE) Hot/Cold: Not tested Proprioception: Impaired by gross assessment Stereognosis: Not tested Additional Comments: LTT general WNL but she did seem to have more difficulty in identifiying locations of touches on R LE Coordination Gross Motor Movements are Fluid and Coordinated: No Fine Motor Movements are Fluid and Coordinated: No Coordination and Movement Description: mild ataxia of BLE during functional mobility, mild lack of coordination of RUE Finger Nose Finger Test: poor depth perception/proprioception of RUE Heel Shin Test: mild ataxia B Motor  Motor Motor: Ataxia Motor - Skilled Clinical Observations: mild ataxia and significant R inattention Mobility  Bed Mobility Bed Mobility: Rolling Right;Rolling Left;Supine to Sit;Sit to Supine Rolling Right: Independent Rolling Left: Independent Supine to Sit: Independent Sit to Supine: Independent Transfers Sit to Stand: Contact Guard/Touching assist Stand to Sit: Contact Guard/Touching assist  Trunk/Postural Assessment  Cervical Assessment Cervical Assessment: Within Functional Limits Thoracic Assessment Thoracic Assessment: Within Functional Limits Lumbar  Assessment Lumbar Assessment: Within Functional Limits Postural Control Postural Control: Within Functional Limits  Extremity/Trunk Assessment RUE Assessment RUE Assessment: Within Functional Limits General Strength Comments: 5/5 grossly, poor proprioception of RUE/mild lack of coordination LUE Assessment LUE Assessment: Within Functional Limits     Refer to Care Plan for Long Term Goals  Recommendations for other services: None    Discharge Criteria: Patient will be discharged from OT if patient refuses treatment 3 consecutive times without medical reason, if treatment goals not met, if there is a change in medical status, if patient makes no progress towards goals or if patient is discharged from hospital.  The above assessment, treatment plan, treatment alternatives and goals were discussed and mutually agreed upon: by patient  Sarah Rhymer 12/03/2019, 9:00 AM

## 2019-12-03 NOTE — Evaluation (Signed)
Speech Language Pathology Assessment and Plan  Patient Details  Name: Wanda Ross MRN: [id number] Date of Birth: 02/08/1951  SLP Diagnosis: Cognitive Impairments  Rehab Potential: Excellent ELOS: 7 days    Today's Date: 12/03/2019 SLP Individual Time: 0903-1000 SLP Individual Time Calculation (min): 18 min   Hospital Problem: Principal Problem:   Glioma ([place]) Active Problems:   Prediabetes   Seizure prophylaxis  Past Medical History:  Past Medical History:  Diagnosis Date  . Glaucoma   . Hyperlipidemia 2010  . Hypertension 1990  . IGT (impaired glucose tolerance) 05/11/2010  . Multinodular goiter 2010   normal function   Past Surgical History:  Past Surgical History:  Procedure Laterality Date  . APPLICATION OF CRANIAL NAVIGATION Left 11/26/2019   Procedure: APPLICATION OF CRANIAL NAVIGATION;  Surgeon: [name], MD;  Location: [facility];  Service: Neurosurgery;  Laterality: Left;  APPLICATION OF CRANIAL NAVIGATION  . COLONOSCOPY N/A 06/16/2016   Procedure: COLONOSCOPY;  Surgeon: Danie Binder, MD;  [facility];  Service: Endoscopy;  Laterality: N/A;  9:30 AM  . COLONOSCOPY N/A 10/22/2019   Procedure: COLONOSCOPY;  Surgeon: Daneil Dolin, MD;  [facility];  Service: Endoscopy;  Laterality: N/A;  9:30  . CRANIOTOMY Left 11/26/2019   Procedure: LEFT PARIETAL CRANIOTOMY FOR RESECTION OF TUMOR;  Surgeon: [name], MD;  Location: [facility];  Service: Neurosurgery;  Laterality: Left;  LEFT PARIETAL CRANIOTOMY FOR RESECTION OF TUMOR  . EYE SURGERY  2017   3 on left eye 1 on right eye for glaucoma by Dr. Venetia Maxon   . EYE SURGERY  2018   Cataract Surgery by Dr. Arlina Robes   . OPERATIVE ULTRASOUND N/A 11/26/2019   Procedure: OPERATIVE ULTRASOUND;  Surgeon: [name], MD;  Location: [facility];  Service: Neurosurgery;  Laterality: N/A;  OPERATIVE ULTRASOUND    Assessment / Plan / Recommendation Clinical Impression   HPI: Wanda Ross  is a 69 year old right-handed female with history of hyperlipidemia, hypertension.  Patient lives alone 1 level home 2 steps to entry.  Independent prior to admission.  She does have a daughter and 2 sons who can assist as needed.  Presented 11/22/2019 with altered mental status and altered gait.  Admission chemistries with potassium 2.8, glucose 155, urinalysis negative nitrite.  CT/MRI showed contrast-enhancing mass within the left parietal white matter extending along the corpus callosum splenium, most consistent with high-grade glioma.  Moderate surrounding hyperintense T2 weighted signal likely combination of edema and nonenhancing tumor with a 3 mm rightward midline shift.  CT of the chest abdomen pelvis showed no acute findings.  Patient underwent left craniotomy for resection parietal and splenial mass on 11/26/2019 per Dr. Duffy Rhody.  Decadron protocol as indicated.  Maintained on Keppra for seizure prophylaxis.  Placed on Lovenox for DVT prophylaxis 11/27/2019.  Tolerating regular diet.  Therapy evaluations completed and patient was admitted for a comprehensive rehab program 12/02/19 and SLP evaluations were completed 12/03/19 with results as follows:   Pt presents with mild cognitive impairments marked primarily by reduced short term memory, semi-complex problem solving, and intermittently delayed auditory processing, particularly when using more complex language. Scores that fell outside of normal limits on the Cognistat evaluation included visual construction (2 - moderate), memory (4 - severe), judgement (3 - mild). Although delayed recall task indicates severe memory impairment, pt able to recall some safety precautions, familiar and some new medications. Pt's speech was 100% intelligible. One dysfluency noted, however expressive/receptive language  was [facility]d  Pt would benefit from skilled while inpatient in order to maximize her safety and functional independence prior to discharge.    Skilled  Therapeutic Interventions          Cognitive evaluation was administered and results were reviewed with pt (please see above for details regarding results).    SLP Assessment  Patient will need skilled [facility] Services during CIR admission    Recommendations  Oral Care Recommendations: Oral care BID Patient destination: Home Follow up Recommendations: 24 hour supervision/assistance;Outpatient SLP Equipment Recommended: None recommended by SLP    SLP Frequency 3 to 5 out of 7 days   SLP Duration  SLP Intensity  SLP Treatment/Interventions 7 days  Minumum of 1-2 x/day, 30 to 90 minutes  Cognitive remediation/compensation;Cueing hierarchy;Functional tasks;Patient/family education;Internal/external aids    Pain Pain Assessment Pain Scale: 0-10 Pain Score: 0-No pain     SLP Evaluation Cognition Overall Cognitive Status: Impaired/Different from baseline Arousal/Alertness: Awake/alert Orientation Level: Oriented X4 Attention: Sustained Sustained Attention: Appears intact Memory: Impaired Memory Impairment: Storage deficit;Retrieval deficit;Decreased short term memory Decreased Short Term Memory: Verbal basic Awareness: Impaired Awareness Impairment: Emergent impairment Problem Solving: Impaired Problem Solving Impairment: Functional basic;Verbal complex Executive Function: Organizing Reasoning: Impaired Reasoning Impairment: Verbal complex Organizing: Impaired Organizing Impairment: Functional complex Safety/Judgment: Impaired  Comprehension Auditory Comprehension Overall Auditory Comprehension: Appears within functional limits for tasks assessed Yes/No Questions: Within Functional Limits Commands: Within Functional Limits Conversation: Simple Interfering Components: Processing speed;Working Curator: Within [employer] Reading Comprehension Reading Status: Not  tested Expression Expression Primary Mode of Expression: Verbal Verbal Expression Overall Verbal Expression: Appears within functional limits for tasks assessed Naming: No impairment Non-Verbal Means of Communication: Not applicable Written Expression Written Expression: Not tested Oral Motor Oral Motor/Sensory Function Overall Oral Motor/Sensory Function: Within functional limits Motor Speech Overall Motor Speech: Appears within functional limits for tasks assessed Phonation: Normal Intelligibility: Intelligible Motor Speech Errors: Not applicable   Short Term Goals: Week 1: SLP Short Term Goal 1 (Week 1): STG=LTG due to ELOS  Refer to Care Plan for Long Term Goals  Recommendations for other services: None   Discharge Criteria: Patient will be discharged from SLP if patient refuses treatment 3 consecutive times without medical reason, if treatment goals not met, if there is a change in medical status, if patient makes no progress towards goals or if patient is discharged from hospital.  The above assessment, treatment plan, treatment alternatives and goals were discussed and mutually agreed upon: by patient  Arbutus Leas 12/03/2019, 10:52 AM

## 2019-12-03 NOTE — Progress Notes (Signed)
Inpatient Rehabilitation  Patient information reviewed and entered into eRehab system by Airrion Otting M. Melina Mosteller, M.A., CCC/SLP, PPS Coordinator.  Information including medical coding, functional ability and quality indicators will be reviewed and updated through discharge.    

## 2019-12-03 NOTE — Progress Notes (Signed)
Occupational Therapy Session Note  Patient Details  Name: JOWANNA LOEFFLER MRN: [id number] Date of Birth: 1950-07-05  Today's Date: 12/03/2019 OT Individual Time: 1510-1540 OT Individual Time Calculation (min): 30 min    Short Term Goals: Week 1:  OT Short Term Goal 1 (Week 1): Pt will attend to R visual field while completing toilet transfer with supervision and LRAD with minimal cueing. OT Short Term Goal 2 (Week 1): Pt will locate objects in R visual field to complete grooming task with supervision and minimal cueing. OT Short Term Goal 3 (Week 1): Pt will don pants with supervision and LRAD.  Skilled Therapeutic Interventions/Progress Updates:  Pt sitting up in w/c agreeable to OT session, no c/o pain.  Pt completed functional mobility within room without AD needing CGA including small space negotiation.  Pt transported to gym and participated in dynamic standing balance task to facilitate crossing midline and reaching outside BOS in various planes and directions right and left side to pick up and place resistive clothes pins.  Pt completed fine motor control task using small beads to complete finger <>palm in hand translation using right hand.  Pt demonstrated good control and precision throughout task.  Pt returned to room, call bell in reach, seat alarm on.  Therapy Documentation Precautions:  Precautions Precautions: Fall Precaution Comments: R inattention Restrictions Weight Bearing Restrictions: No   Therapy/Group: Individual Therapy  Ezekiel Slocumb 12/03/2019, 5:01 PM

## 2019-12-03 NOTE — Evaluation (Signed)
Physical Therapy Assessment and Plan  Patient Details  Name: ORLEAN HOLTROP MRN: [id number] Date of Birth: 12/20/1950  PT Diagnosis: Abnormality of gait, Ataxia, Coordination disorder, Difficulty walking, Impaired cognition, Impaired sensation and Muscle weakness Rehab Potential: Excellent ELOS: 7 days   Today's Date: 12/03/2019 PT Individual Time: 1005-1100 PT Individual Time Calculation (min): 55 min    Hospital Problem: Principal Problem:   Glioma ([place]) Active Problems:   Prediabetes   Seizure prophylaxis   Past Medical History:  Past Medical History:  Diagnosis Date  . Glaucoma   . Hyperlipidemia 2010  . Hypertension 1990  . IGT (impaired glucose tolerance) 05/11/2010  . Multinodular goiter 2010   normal function   Past Surgical History:  Past Surgical History:  Procedure Laterality Date  . APPLICATION OF CRANIAL NAVIGATION Left 11/26/2019   Procedure: APPLICATION OF CRANIAL NAVIGATION;  Surgeon: [name], MD;  Location: [facility];  Service: Neurosurgery;  Laterality: Left;  APPLICATION OF CRANIAL NAVIGATION  . COLONOSCOPY N/A 06/16/2016   Procedure: COLONOSCOPY;  Surgeon: Danie Binder, MD;  [facility];  Service: Endoscopy;  Laterality: N/A;  9:30 AM  . COLONOSCOPY N/A 10/22/2019   Procedure: COLONOSCOPY;  Surgeon: Daneil Dolin, MD;  [facility];  Service: Endoscopy;  Laterality: N/A;  9:30  . CRANIOTOMY Left 11/26/2019   Procedure: LEFT PARIETAL CRANIOTOMY FOR RESECTION OF TUMOR;  Surgeon: [name], MD;  Location: [facility];  Service: Neurosurgery;  Laterality: Left;  LEFT PARIETAL CRANIOTOMY FOR RESECTION OF TUMOR  . EYE SURGERY  2017   3 on left eye 1 on right eye for glaucoma by Dr. Venetia Maxon   . EYE SURGERY  2018   Cataract Surgery by Dr. Arlina Robes   . OPERATIVE ULTRASOUND N/A 11/26/2019   Procedure: OPERATIVE ULTRASOUND;  Surgeon: [name], MD;  Location: [facility];  Service: Neurosurgery;  Laterality: N/A;  OPERATIVE  ULTRASOUND    Assessment & Plan Clinical Impression:  Markeshia H. Ross is a 69 year old right-handed female with history of hyperlipidemia, hypertension.  Patient lives alone 1 level home 2 steps to entry.  Independent prior to admission.  She does have a daughter and 2 sons who can assist as needed.  Presented 11/22/2019 with altered mental status and altered gait.  Admission chemistries with potassium 2.8, glucose 155, urinalysis negative nitrite.  CT/MRI showed contrast-enhancing mass within the left parietal white matter extending along the corpus callosum splenium, most consistent with high-grade glioma.  Moderate surrounding hyperintense T2 weighted signal likely combination of edema and nonenhancing tumor with a 3 mm rightward midline shift.  CT of the chest abdomen pelvis showed no acute findings.  Patient underwent left craniotomy for resection parietal and splenial mass on 11/26/2019 per Dr. Duffy Rhody.  Decadron protocol as indicated.  Maintained on Keppra for seizure prophylaxis.  Placed on Lovenox for DVT prophylaxis 11/27/2019.  Tolerating regular diet.  Therapy evaluations completed and patient was admitted for a comprehensive rehab program. Patient transferred to CIR on 12/02/2019 .   Patient currently requires min with mobility secondary to muscle weakness, decreased cardiorespiratoy endurance, ataxia and decreased coordination, decreased attention to right and decreased motor planning, decreased attention, decreased awareness, decreased problem solving, decreased safety awareness, decreased memory and delayed processing and decreased standing balance and decreased balance strategies.  Prior to hospitalization, patient was independent  with mobility and lived with Alone in a House home.  Home access is 2Stairs to enter.  Patient will benefit from skilled  PT intervention to maximize safe functional mobility, minimize fall risk and decrease caregiver burden for planned discharge home with  24 hour supervision.  Anticipate patient will benefit from follow up OP at discharge.  PT - End of Session Activity Tolerance: Decreased this session Endurance Deficit: Yes PT Assessment Rehab Potential (ACUTE/IP ONLY): Excellent PT Barriers to Discharge: Decreased caregiver support;Home environment access/layout PT Barriers to Discharge Comments: 2 STE no rails, will likely need 24/7 S at DC PT Patient demonstrates impairments in the following area(s): Balance;Endurance;Motor;Perception;Safety;Sensory PT Transfers Functional Problem(s): Bed Mobility;Bed to Chair;Car;Furniture;Floor PT Locomotion Functional Problem(s): Ambulation;Stairs PT Plan PT Intensity: Minimum of 1-2 x/day ,45 to 90 minutes PT Frequency: 5 out of 7 days PT Duration Estimated Length of Stay: 7 days PT Treatment/Interventions: Ambulation/gait training;Discharge planning;Functional mobility training;Psychosocial support;Therapeutic Activities;Visual/perceptual remediation/compensation;Balance/vestibular training;Disease management/prevention;Neuromuscular re-education;Skin care/wound management;Therapeutic Exercise;Wheelchair propulsion/positioning;Cognitive remediation/compensation;DME/adaptive equipment instruction;Pain management;Splinting/orthotics;UE/LE Strength taining/ROM;Community reintegration;Functional electrical stimulation;Patient/family education;UE/LE Arts development officer PT Transfers Anticipated Outcome(s): S, LRAD PT Locomotion Anticipated Outcome(s): S, LRAD PT Recommendation Recommendations for Other Services: Therapeutic Recreation consult Therapeutic Recreation Interventions: Pet therapy;Kitchen group;Stress management;Outing/community reintergration Follow Up Recommendations: Outpatient PT Patient destination: Home Equipment Recommended: To be determined  Skilled Therapeutic Intervention  Patient received in [facility], very pleasant and willing to participate in session today. See below  for general mobility levels and evaluation, but generally able to perform most mobility with min guard to MinA with no device for safety due to poor balance. Mostly limited due to impaired functional balance and impaired attention to right side as well as impaired processing and problem solving when it comes to dynamic obstacle navigation on the right side. Scored 35/56 on Berg balance test which is consistent with impaired balance presentation today. Left sitting up in [facility] with all needs met seatbelt alarm active this morning. Anticipate she will do well with skilled PT services.   PT Evaluation Precautions/Restrictions Precautions Precautions: Fall Precaution Comments: R inattention Restrictions Weight Bearing Restrictions: No General Chart Reviewed: Yes Response to Previous Treatment: Patient with no complaints from previous session. Family/Caregiver Present: No Vital Signs  Pain Pain Assessment Pain Scale: 0-10 Pain Score: 0-No pain Home Living/Prior Functioning Home Living Available Help at Discharge: Family;Available 24 hours/day (family able to provide 24/7 assist if needed per patient) Type of Home: House Home Access: Stairs to enter [employer] of Steps: 2 Entrance Stairs-Rails: None Home Layout: One level Biochemist, clinical: Standard Additional Comments: Pt is not using an AD at home currently  Lives With: Alone Prior Function Level of Independence: Independent with gait;Independent with transfers;Independent with homemaking with ambulation;Independent with basic ADLs  Able to Take Stairs?: Yes Driving: Yes Vocation: Retired Comments: likes gardening and [employer] Vision/Perception  Vision - Assessment Eye Alignment: Within Passenger transport manager Range of Motion: Within Functional Limits Convergence: Within functional limits Additional Comments: difficulty in locating objects in R visual field, will need further assessment to determine if this is true [facility] or  just R neglect Perception Perception: Impaired Inattention/Neglect: Does not attend to right visual field;Impaired-to be further tested in functional context Praxis Praxis: Intact  Cognition Overall Cognitive Status: Impaired/Different from baseline Arousal/Alertness: Awake/alert Orientation Level: Oriented X4 Attention: Sustained Sustained Attention: Appears intact Memory: Impaired Memory Impairment: Storage deficit;Retrieval deficit;Decreased short term memory Decreased Short Term Memory: Verbal basic Awareness: Impaired Awareness Impairment: Emergent impairment Problem Solving: Impaired Problem Solving Impairment: Functional basic;Verbal complex Executive Function: Organizing Reasoning: Impaired Reasoning Impairment: Verbal complex Organizing: Impaired Organizing Impairment: Functional complex Safety/Judgment: Impaired Sensation Sensation Light Touch: Impaired by  gross assessment Hot/Cold: Not tested Proprioception: Impaired by gross assessment Stereognosis: Not tested Additional Comments: LTT general WNL but she did seem to have more difficulty in identifiying locations of touches on R LE Coordination Gross Motor Movements are Fluid and Coordinated: No Fine Motor Movements are Fluid and Coordinated: No Coordination and Movement Description: mild ataxia B Finger Nose Finger Test: mild ataxia B Heel Shin Test: mild ataxia B Motor  Motor Motor: Ataxia Motor - Skilled Clinical Observations: mild ataxia and significant R inattention  Mobility Bed Mobility Bed Mobility: Rolling Right;Rolling Left;Supine to Sit;Sit to Supine Rolling Right: Independent Rolling Left: Independent Supine to Sit: Independent Sit to Supine: Independent Transfers Transfers: Sit to Stand;Stand to Sit;Stand Pivot Transfers Sit to Stand: Contact Guard/Touching assist Stand to Sit: Contact Guard/Touching assist Stand Pivot Transfers: Minimal Assistance - Patient > 75% Stand Pivot Transfer  Details: Verbal cues for precautions/safety;Verbal cues for technique Transfer (Assistive device): None Locomotion  Gait Ambulation: Yes Gait Assistance: Minimal Assistance - Patient > 75% Gait Distance (Feet): 390 Feet Assistive device: None Gait Assistance Details: Verbal cues for precautions/safety;Verbal cues for sequencing Gait Gait: Yes Gait Pattern: Impaired Gait Pattern: Step-through pattern;Trendelenburg;Ataxic;Decreased trunk rotation;Trunk flexed;Wide base of support Stairs / Additional Locomotion Stairs: Yes Stairs Assistance: Contact Guard/Touching assist Stair Management Technique: Two rails Number of Stairs: 12 Height of Stairs: 6 Ramp: Minimal Assistance - Patient >75% Curb: Contact Guard/Touching assist (B rails) Wheelchair Mobility Wheelchair Mobility: No  Trunk/Postural Assessment  Cervical Assessment Cervical Assessment: Within Functional Limits Thoracic Assessment Thoracic Assessment: Within Functional Limits Lumbar Assessment Lumbar Assessment: Within Functional Limits Postural Control Postural Control: Within Functional Limits  Balance Balance Balance Assessed: Yes Standardized Balance Assessment Standardized Balance Assessment: Berg Balance Test Berg Balance Test Sit to Stand: Able to stand  independently using hands Standing Unsupported: Able to stand 2 minutes with supervision Sitting with Back Unsupported but Feet Supported on Floor or Stool: Able to sit safely and securely 2 minutes Stand to Sit: Controls descent by using hands Transfers: Able to transfer with verbal cueing and /or supervision Standing Unsupported with Eyes Closed: Able to stand 10 seconds with supervision Standing Ubsupported with Feet Together: Able to place feet together independently and stand for 1 minute with supervision From Standing, Reach Forward with Outstretched Arm: Can reach forward >12 cm safely (5") From Standing Position, Pick up Object from Floor: Able to pick  up shoe, needs supervision From Standing Position, Turn to Look Behind Over each Shoulder: Looks behind one side only/other side shows less weight shift Turn 360 Degrees: Able to turn 360 degrees safely but slowly Standing Unsupported, Alternately Place Feet on Step/Stool: Able to complete 4 steps without aid or supervision Standing Unsupported, One Foot in Front: Loses balance while stepping or standing Standing on One Leg: Tries to lift leg/unable to hold 3 seconds but remains standing independently Total Score: 35 Static Sitting Balance Static Sitting - Level of Assistance: 7: Independent Dynamic Sitting Balance Dynamic Sitting - Level of Assistance: 5: Stand by assistance Static Standing Balance Static Standing - Level of Assistance: 5: Stand by assistance Dynamic Standing Balance Dynamic Standing - Level of Assistance: 4: Min assist Extremity Assessment  RUE Assessment RUE Assessment: Not tested LUE Assessment LUE Assessment: Not tested RLE Assessment RLE Assessment: Within Functional Limits Active Range of Motion (AROM) Comments: WNL General Strength Comments: ankle DF 4+/5, quad 5/5, seated hip ABD 4+/5, hip flexor 4-/5 LLE Assessment LLE Assessment: Within Functional Limits Active Range of Motion (AROM) Comments: WNL General  Strength Comments: ankle DF 4+/5, quad 5/5, seated hip ABD 4+/5, hip flexor 4-/5    Refer to Care Plan for Long Term Goals  Recommendations for other services: Therapeutic Recreation  Pet therapy, Kitchen group, Stress management and Outing/community reintegration  Discharge Criteria: Patient will be discharged from PT if patient refuses treatment 3 consecutive times without medical reason, if treatment goals not met, if there is a change in medical status, if patient makes no progress towards goals or if patient is discharged from hospital.  The above assessment, treatment plan, treatment alternatives and goals were discussed and mutually agreed  upon: by patient  Windell Norfolk, DPT, PN1   Supplemental Physical Therapist [place]    Pager [phone] Acute Rehab Office [phone]

## 2019-12-03 NOTE — Progress Notes (Signed)
[facility] PHYSICAL MEDICINE & REHABILITATION PROGRESS NOTE  Subjective/Complaints: Patient seen sitting up in her chair this AM, brushing her teeth.  She states she slept well overnight, however, her roommate had medical issues that disturbed her sleep. She is ready to begin therapies. She notes improvement in strength.   ROS: Denies CP, SOB, N/V/D  Objective: Vital Signs: Blood pressure 119/66, pulse 61, temperature 98.5 F (36.9 C), temperature source Oral, resp. rate 16, height 5\' 8"  (1.727 m), weight 73.8 kg, SpO2 97 %. No results found. No results for input(s): WBC, HGB, HCT, PLT in the last 72 hours. No results for input(s): NA, K, CL, CO2, GLUCOSE, BUN, CREATININE, CALCIUM in the last 72 hours.  Physical Exam: BP 119/66 (BP Location: Left Arm)   Pulse 61   Temp 98.5 F (36.9 C) (Oral)   Resp 16   Ht 5\' 8"  (1.727 m)   Wt 73.8 kg   SpO2 97%   BMI 24.74 kg/m  Constitutional: No distress . Vital signs reviewed. HENT: Normocephalic.  Crani site. Eyes: EOMI. No discharge. Cardiovascular: No JVD. Respiratory: Normal effort.  No stridor. GI: Non-distended. Skin: Crani incision C/D/I Psych: Normal mood.  Normal behavior. Musc: No edema in extremities.  No tenderness in extremities. Neuro: Alert Follows simple commands RUE/RLE: 4+-5/5 proximal to distal  Assessment/Plan: 1. Functional deficits secondary to glioma status post resection which require 3+ hours per day of interdisciplinary therapy in a comprehensive inpatient rehab setting.  Physiatrist is providing close team supervision and 24 hour management of active medical problems listed below.  Physiatrist and rehab team continue to assess barriers to discharge/monitor patient progress toward functional and medical goals  Care Tool:  Bathing  Bathing activity did not occur: Safety/medical concerns (daughter came and bathed the patient)           Bathing assist       Upper Body Dressing/Undressing Upper  body dressing Upper body dressing/undressing activity did not occur (including orthotics): N/A      Upper body assist      Lower Body Dressing/Undressing Lower body dressing    Lower body dressing activity did not occur: N/A       Lower body assist       Toileting Toileting    Toileting assist Assist for toileting: Contact Guard/Touching assist     Transfers Chair/bed transfer  Transfers assist           Locomotion Ambulation   Ambulation assist              Walk 10 feet activity   Assist           Walk 50 feet activity   Assist           Walk 150 feet activity   Assist           Walk 10 feet on uneven surface  activity   Assist           Wheelchair     Assist               Wheelchair 50 feet with 2 turns activity    Assist            Wheelchair 150 feet activity     Assist            Medical Problem List and Plan: 1.  Altered mental status with gait abnormality secondary to glioma.  Status post left craniotomy resection of tumor 11/26/2019.  Decadron protocol.  Begin CIR evaluations  Team conference today to discuss current and goals and coordination of care, home and environmental barriers, and discharge planning with nursing, case manager, and therapies.  2.  Antithrombotics: -DVT/anticoagulation: Lovenox initiated 11/27/2019             -antiplatelet therapy: N/A 3. Pain Management: Hydrocodone 4. Mood: Provide motional support             -antipsychotic agents: N/A 5. Neuropsych: This patient is ?fully capable of making decisions on her own behalf. 6. Skin/Wound Care: Routine skin checks 7. Fluids/Electrolytes/Nutrition: Routine in and outs 8.  Seizure prophylaxis.  Keppra 500 mg twice daily x1 week, to be completed today 9.  Hypertension.  Clonidine 0.3 mg nightly, Norvasc 5 mg daily.    Monitor with increased mobility 10.  Hyperlipidemia.  Pravachol 11.  Steroid-induced  hyperglycemia on prediabetes.  SSI.  Monitor with increased mobility 12. 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 F/u pathology. Plan for radiation and chemotherapy- neuro-onc and rad onc are following.  13.  Leukocytosis-likely steroid-induced  WBC 12.1 on 7/4  Afebrile  LOS: 1 days A FACE TO FACE EVALUATION WAS PERFORMED  Ankit [name] 12/03/2019, 8:51 AM

## 2019-12-04 ENCOUNTER — Inpatient Hospital Stay (HOSPITAL_COMMUNITY): Payer: Medicare PPO | Admitting: Occupational Therapy

## 2019-12-04 ENCOUNTER — Inpatient Hospital Stay (HOSPITAL_COMMUNITY): Payer: Medicare PPO | Admitting: Physical Therapy

## 2019-12-04 ENCOUNTER — Inpatient Hospital Stay (HOSPITAL_COMMUNITY): Payer: Medicare PPO | Admitting: Speech Pathology

## 2019-12-04 DIAGNOSIS — E46 Unspecified protein-calorie malnutrition: Secondary | ICD-10-CM

## 2019-12-04 DIAGNOSIS — R7401 Elevation of levels of liver transaminase levels: Secondary | ICD-10-CM

## 2019-12-04 DIAGNOSIS — I10 Essential (primary) hypertension: Secondary | ICD-10-CM

## 2019-12-04 DIAGNOSIS — E8809 Other disorders of plasma-protein metabolism, not elsewhere classified: Secondary | ICD-10-CM

## 2019-12-04 LAB — GLUCOSE, CAPILLARY
Glucose-Capillary: 123 mg/dL — ABNORMAL HIGH (ref 70–99)
Glucose-Capillary: 111 mg/dL — ABNORMAL HIGH (ref 70–99)
Glucose-Capillary: 87 mg/dL (ref 70–99)
Glucose-Capillary: 93 mg/dL (ref 70–99)
Glucose-Capillary: 128 mg/dL — ABNORMAL HIGH (ref 70–99)

## 2019-12-04 MED ORDER — PROSOURCE PLUS PO LIQD
30.0000 mL | Freq: Two times a day (BID) | ORAL | Status: DC
  Administered 2019-12-04 – 2019-12-10 (×10): 30 mL via ORAL
  Filled 2019-12-04 (×14): qty 30

## 2019-12-04 NOTE — Progress Notes (Signed)
Physical Therapy Session Note  Patient Details  Name: Wanda Ross MRN: [id number] Date of Birth: 03-30-1951  Today's Date: 12/04/2019 PT Individual Time: 1448-1530 PT Individual Time Calculation (min): 42 min   Short Term Goals: Week 1:  PT Short Term Goal 1 (Week 1): STG = LTG due to ELOS  Skilled Therapeutic Interventions/Progress Updates:    Patient received in recliner, pleasant and willing to participate in session. Able to complete all functional transfers and gait multiple distances of approximately 132f with min guard and no significant LOB today! Spent some time working on functional balance skills including tandem stance, heel raises, and marches on blue foam pad with no UEs and Min-ModA to maintain balance, followed by side stepping, 3 way and 2 level cone taps on solid surface with Min-ModA to maintain balance as well as DGI based tasks (but not complete DGI) in the hallway. Finished session with 6 minutes on Nustep with BLEs on resistance 5-6 today with RPE at 13-14/20. Left sitting up in recliner with all needs met, chair alarm active this afternoon.   Therapy Documentation Precautions:  Precautions Precautions: Fall Precaution Comments: R inattention Restrictions Weight Bearing Restrictions: No Pain: Pain Assessment Pain Scale: 0-10 Pain Score: 0-No pain    Therapy/Group: Individual Therapy   KWindell Norfolk DPT, PN1   Supplemental Physical Therapist [place]   Pager [phone] Rehab Office [phone]   12/04/2019, 3:59 PM

## 2019-12-04 NOTE — Plan of Care (Signed)
  Problem: RH Vision Goal: RH LTG Vision (Specify) Outcome: Progressing   Problem: Consults Goal: RH GENERAL PATIENT EDUCATION Description: See Patient Education module for education specifics. Outcome: Progressing Goal: Skin Care Protocol Initiated - if Braden Score 18 or less Description: If consults are not indicated, leave blank or document N/A Outcome: Progressing Goal: Nutrition Consult-if indicated Outcome: Progressing Goal: Diabetes Guidelines if Diabetic/Glucose > 140 Description: If diabetic or lab glucose is > 140 mg/dl - Initiate Diabetes/Hyperglycemia Guidelines & Document Interventions  Outcome: Progressing   Problem: RH BOWEL ELIMINATION Goal: RH STG MANAGE BOWEL WITH ASSISTANCE Description: STG Manage Bowel with Assistance. Outcome: Progressing Goal: RH STG MANAGE BOWEL W/MEDICATION W/ASSISTANCE Description: STG Manage Bowel with Medication with Assistance. Outcome: Progressing   Problem: RH BLADDER ELIMINATION Goal: RH STG MANAGE BLADDER WITH ASSISTANCE Description: STG Manage Bladder With Assistance Outcome: Progressing Goal: RH STG MANAGE BLADDER WITH MEDICATION WITH ASSISTANCE Description: STG Manage Bladder With Medication With Assistance. Outcome: Progressing Goal: RH STG MANAGE BLADDER WITH EQUIPMENT WITH ASSISTANCE Description: STG Manage Bladder With Equipment With Assistance Outcome: Progressing   Problem: RH SKIN INTEGRITY Goal: RH STG SKIN FREE OF INFECTION/BREAKDOWN Outcome: Progressing Goal: RH STG MAINTAIN SKIN INTEGRITY WITH ASSISTANCE Description: STG Maintain Skin Integrity With Assistance. Outcome: Progressing Goal: RH STG ABLE TO PERFORM INCISION/WOUND CARE W/ASSISTANCE Description: STG Able To Perform Incision/Wound Care With Assistance. Outcome: Progressing   Problem: RH SAFETY Goal: RH STG ADHERE TO SAFETY PRECAUTIONS W/ASSISTANCE/DEVICE Description: STG Adhere to Safety Precautions With Assistance/Device. Outcome:  Progressing Goal: RH STG DECREASED RISK OF FALL WITH ASSISTANCE Description: STG Decreased Risk of Fall With Assistance. Outcome: Progressing

## 2019-12-04 NOTE — Progress Notes (Signed)
Speech Language Pathology Daily Session Note  Patient Details  Name: LIANY MUMPOWER MRN: [id number] Date of Birth: April 10, 1951  Today's Date: 12/04/2019 SLP Individual Time: 1131-1158 SLP Individual Time Calculation (min): 27 min  Short Term Goals: Week 1: SLP Short Term Goal 1 (Week 1): STG=LTG due to ELOS  Skilled Therapeutic Interventions: Pt was seen for skilled ST targeting cognitive goals. SLP facilitated session with Max faded to [place] A verbal and visual cues for functional problem solving when interpreting medication labels and organizing a basic QID pill chart. Cueing could be faded 2/3 of the way through the task. Pt also recalled target of earlier AM ST session with Min A verbal cues and used schedule as external aid to anticipate future sessions. Pt left sitting in recliner with alarm on and needs within reach. Continue per current plan of care.      Pain Pain Assessment Pain Scale: 0-10 Pain Score: 0-No pain  Therapy/Group: Individual Therapy  Arbutus Leas 12/04/2019, 7:16 AM

## 2019-12-04 NOTE — Progress Notes (Signed)
Speech Language Pathology Daily Session Note  Patient Details  Name: Wanda Ross MRN: [id number] Date of Birth: 07/23/1950  Today's Date: 12/04/2019 SLP Individual Time: 0730-0827 SLP Individual Time Calculation (min): 57 min  Short Term Goals: Week 1: SLP Short Term Goal 1 (Week 1): STG=LTG due to ELOS  Skilled Therapeutic Interventions: Pt was seen for skilled ST targeting cognitive goals. SLP facilitated session with various basic money management tasks. Pt required signigicant amount of extra time and cues for organization throughout task. Mod A verbal cues and a visual aid required for working memory and Max A for error awareness were required within task.  Although pt could display set amounts of change with Min A verbal and visual cues for problem solving, Mod-Max required to calculate totals and change (addition and subtraction). Pt frequently stated "now what are you trying to get to me to do?" - requiring multiple repetitions and attention to visual aids to maximize comprehension. Pt with good insight into level of difficulty this task presented. Pt left sitting in chair with alarm set and needs within reach. Continue per current plan of care.     Pain Pain Assessment Pain Scale: 0-10 Pain Score: 0-No pain  Therapy/Group: Individual Therapy  Arbutus Leas 12/04/2019, 7:15 AM

## 2019-12-04 NOTE — Progress Notes (Signed)
PHYSICAL MEDICINE & REHABILITATION PROGRESS NOTE  Subjective/Complaints: Patient seen sitting up in her chair this AM, working with therapies.  She states she slept well overnight.  She states she had a good first day of therapies yesterday.  She denies visual deficits, discussed field cut with therapies. She is aware of her discharge date.  ROS: Denies CP, SOB, N/V/D  Objective: Vital Signs: Blood pressure 111/70, pulse 64, temperature 98.3 F (36.8 C), temperature source Oral, resp. rate 18, height 5\' 8"  (1.727 m), weight 73.8 kg, SpO2 100 %. No results found. Recent Labs    12/03/19 1616  WBC 23.8*  HGB 15.1*  HCT 44.2  PLT 383   Recent Labs    12/03/19 1616  NA 138  K 3.8  CL 101  CO2 26  GLUCOSE 145*  BUN 25*  CREATININE 0.81  CALCIUM 8.9    Physical Exam: BP 111/70   Pulse 64   Temp 98.3 F (36.8 C) (Oral)   Resp 18   Ht 5\' 8"  (1.727 m)   Wt 73.8 kg   SpO2 100%   BMI 24.74 kg/m   Constitutional: No distress . Vital signs reviewed. HENT: Crani site. Normocephalic.   Eyes: EOMI. No discharge. Cardiovascular: No JVD. Respiratory: Normal effort.  No stridor. GI: Non-distended. Skin: Crani site C/D/I Psych: Normal mood.  Normal behavior. Musc: No edema in extremities.  No tenderness in extremities. Neuro: Alert Follows simple commands RUE/RLE: 4+-5/5 proximal to distal, unchanged  Assessment/Plan: 1. Functional deficits secondary to glioma status post resection which require 3+ hours per day of interdisciplinary therapy in a comprehensive inpatient rehab setting.  Physiatrist is providing close team supervision and 24 hour management of active medical problems listed below.  Physiatrist and rehab team continue to assess barriers to discharge/monitor patient progress toward functional and medical goals  Care Tool:  Bathing  Bathing activity did not occur:  (N/A) Body parts bathed by patient: Right arm, Left arm, Chest, Buttocks          Bathing assist Assist Level: Contact Guard/Touching assist     Upper Body Dressing/Undressing Upper body dressing Upper body dressing/undressing activity did not occur (including orthotics): N/A What is the patient wearing?: Bra, Pull over shirt    Upper body assist Assist Level: Supervision/Verbal cueing    Lower Body Dressing/Undressing Lower body dressing    Lower body dressing activity did not occur: N/A What is the patient wearing?: Underwear/pull up, Pants     Lower body assist Assist for lower body dressing: Minimal Assistance - Patient > 75%     Toileting Toileting    Toileting assist Assist for toileting: Contact Guard/Touching assist     Transfers Chair/bed transfer  Transfers assist     Chair/bed transfer assist level: Contact Guard/Touching assist     Locomotion Ambulation   Ambulation assist      Assist level: Minimal Assistance - Patient > 75% Assistive device:  (wheel cjhair) Max distance: 335ft   Walk 10 feet activity   Assist     Assist level: Minimal Assistance - Patient > 75% Assistive device: No Device   Walk 50 feet activity   Assist    Assist level: Minimal Assistance - Patient > 75% Assistive device: No Device    Walk 150 feet activity   Assist    Assist level: Minimal Assistance - Patient > 75% Assistive device: No Device    Walk 10 feet on uneven surface  activity   Assist  Assist level: Minimal Assistance - Patient > 75% Assistive device: Other (comment) (no device)   Wheelchair     Assist Will patient use wheelchair at discharge?: No Type of Wheelchair:  (functional ambulator) Wheelchair activity did not occur: N/A (functional ambulator)         Wheelchair 50 feet with 2 turns activity    Assist    Wheelchair 50 feet with 2 turns activity did not occur: N/A (functional ambulator)       Wheelchair 150 feet activity     Assist Wheelchair 150 feet activity did not occur: N/A  (functional ambulator)          Medical Problem List and Plan: 1.  Altered mental status with gait abnormality secondary to glioma.  Status post left craniotomy resection of tumor 11/26/2019.    Decadron taper through 7/13.  Continue CIR 2.  Antithrombotics: -DVT/anticoagulation: Lovenox initiated 11/27/2019             -antiplatelet therapy: N/A 3. Pain Management: Hydrocodone 4. Mood: Provide motional support             -antipsychotic agents: N/A 5. Neuropsych: This patient is ?fully capable of making decisions on her own behalf. 6. Skin/Wound Care: Routine skin checks 7. Fluids/Electrolytes/Nutrition: Routine in and outs 8.  Seizure prophylaxis.  Keppra 500 mg twice daily x1 week, discussed with pharmacy, d/ced 9.  Hypertension.  Clonidine 0.3 mg nightly, Norvasc 5 mg daily.    Controlled on 7/8  Monitor with increased mobility 10.  Hyperlipidemia.  Pravachol 11.  Steroid-induced hyperglycemia on prediabetes.  SSI.  Relatively controlled on 7/8  Monitor with increased mobility 12. 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 F/u pathology. Plan for radiation and chemotherapy- neuro-onc and rad onc are following.  13.  Leukocytosis-likely steroid-induced  WBC 23.8 on 7/7  Afebrile 14. Transaminitis  ALT elevated on 7/7  Cont to monitor 15. Moderate Hypoalbuminemia  Supplement initiated  LOS: 2 days A FACE TO FACE EVALUATION WAS PERFORMED  Wanda Ross [name] 12/04/2019, 10:12 AM

## 2019-12-04 NOTE — Care Management (Signed)
Patient ID: Wanda Ross, female   DOB: 10-Jul-1950, 69 y.o.   MRN: [id number] Met with the patient to review role of the CM in collaboration with the SW Margreta Journey) to facilitate preparation for discharge. Discussed management of HTN, pre-diabetes and HLD in addition to treatment post discharge. Reviewed carbohydrate counting and food options. Patient reported she has been on weight watcher's program for several years. Reviewed snack options and small meals for poor appetite.No other nursing concerns noted at present. Margarito Liner

## 2019-12-04 NOTE — Progress Notes (Signed)
Occupational Therapy Session Note  Patient Details  Name: Wanda Ross MRN: [id number] Date of Birth: [birth year]  Today's Date: 12/04/2019 OT Individual Time: 8850-2774 OT Individual Time Calculation (min): 56 min    Short Term Goals: Week 1:  OT Short Term Goal 1 (Week 1): Pt will attend to R visual field while completing toilet transfer with supervision and LRAD with minimal cueing. OT Short Term Goal 2 (Week 1): Pt will locate objects in R visual field to complete grooming task with supervision and minimal cueing. OT Short Term Goal 3 (Week 1): Pt will don pants with supervision and LRAD.  Skilled Therapeutic Interventions/Progress Updates:    Patient seated in w/c, alert and ready for therapy session.  She denies pain and need to complete toileting or other adl tasks at this time.  Completed dynavision activity in stance (2 minute, whole screen)  Trial 1 (bilateral UEs) = 3.16, trial 2 ( L UE ) = 1.62 sec, trial 3 ( R UE ) = 1.56 - note slightly slower in right visual fields.  Sit to stand with CS, no LOB during activity.  Completed design copy task in stance with pegs - 20 piece design - required increased time started on left side with cues for directions, able to place first 5 pieces without additional cues, max cues for right side of task.   Completed 5 and 7 piece design copy with min difficulty.  Short distance ambulation without AD CGA w/c to recliner where she remained at close of session with seat belt alarm set and call bell in hand.    Therapy Documentation Precautions:  Precautions Precautions: Fall Precaution Comments: R inattention Restrictions Weight Bearing Restrictions: (P) No  Therapy/Group: Individual Therapy  Carlos Levering 12/04/2019, 7:35 AM

## 2019-12-04 NOTE — Progress Notes (Signed)
Inpatient Rehabilitation Care Coordinator Assessment and Plan  Patient Details  Name: Wanda Ross MRN: [id number] Date of Birth: 1951/04/03  Today's Date: 12/04/2019  Problem List:  Patient Active Problem List   Diagnosis Date Noted  . Hypoalbuminemia due to protein-calorie malnutrition ([place])   . Transaminitis   . Benign essential HTN   . Prediabetes   . Seizure prophylaxis   . Glioma ([place]) 12/02/2019  . Right sided weakness   . Dyslipidemia   . Essential hypertension   . Steroid-induced hyperglycemia   . Leucocytosis   . Brain mass 11/22/2019  . 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 Abnormal electrocardiogram (ECG) (EKG) 12/31/2017  . White coat syndrome with diagnosis of hypertension 12/31/2017  . Glaucoma 12/26/2013  . Vitamin D deficiency 05/27/2009  . IMPAIRED GLUCOSE TOLERANCE 05/27/2009  . OSTEOPENIA 05/27/2009  . Multiple thyroid nodules 11/06/2007  . Hyperlipidemia 11/06/2007  . Malignant hypertension 11/06/2007   Past Medical History:  Past Medical History:  Diagnosis Date  . Glaucoma   . Hyperlipidemia 2010  . Hypertension 1990  . IGT (impaired glucose tolerance) 05/11/2010  . Multinodular goiter 2010   normal function   Past Surgical History:  Past Surgical History:  Procedure Laterality Date  . APPLICATION OF CRANIAL NAVIGATION Left 11/26/2019   Procedure: APPLICATION OF CRANIAL NAVIGATION;  Surgeon: [name], MD;  Location: [facility];  Service: Neurosurgery;  Laterality: Left;  APPLICATION OF CRANIAL NAVIGATION  . COLONOSCOPY N/A 06/16/2016   Procedure: COLONOSCOPY;  Surgeon: Danie Binder, MD;  [facility];  Service: Endoscopy;  Laterality: N/A;  9:30 AM  . COLONOSCOPY N/A 10/22/2019   Procedure: COLONOSCOPY;  Surgeon: Daneil Dolin, MD;  [facility];  Service: Endoscopy;  Laterality: N/A;  9:30  . CRANIOTOMY Left 11/26/2019   Procedure: LEFT PARIETAL CRANIOTOMY FOR RESECTION OF TUMOR;  Surgeon: [name], MD;   Location: [facility];  Service: Neurosurgery;  Laterality: Left;  LEFT PARIETAL CRANIOTOMY FOR RESECTION OF TUMOR  . EYE SURGERY  2017   3 on left eye 1 on right eye for glaucoma by Dr. Venetia Maxon   . EYE SURGERY  2018   Cataract Surgery by Dr. Arlina Robes   . OPERATIVE ULTRASOUND N/A 11/26/2019   Procedure: OPERATIVE ULTRASOUND;  Surgeon: [name], MD;  Location: [facility];  Service: Neurosurgery;  Laterality: N/A;  OPERATIVE ULTRASOUND   Social History:  reports that she has never smoked. She has never used smokeless tobacco. She reports that she does not drink alcohol and does not use drugs.  Family / Support Systems Marital Status: Widow/Widower Children: son and daughter Other Supports: sister Anticipated Caregiver: son and daughter Ability/Limitations of Caregiver: both works during the day once school year begins in August  Social History Preferred language: English Religion: Christian Read: Yes Write: Yes Employment Status: Retired   Abuse/Neglect Abuse/Neglect Assessment Can Be Completed: Yes Physical Abuse: Denies Verbal Abuse: Denies Sexual Abuse: Denies Exploitation of patient/patient's resources: Denies Self-Neglect: Denies  Emotional Status Pt's affect, behavior and adjustment status: no Recent Psychosocial Issues: no Psychiatric History: no Substance Abuse History: no  Patient / Family Perceptions, Expectations & Goals Pt/Family understanding of illness & functional limitations: yes Pt/family expectations/goals: Goal to return home  [employer]: None Premorbid Home Care/DME Agencies: None Transportation available at discharge: family able to transport  Discharge Planning Living Arrangements: Alone Support Systems: Children Type of Residence: Private residence (1 level home: 2 steps to enter home, railings to garage)  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 Sw will continue to follow up with questions and concerns.   Dyanne Iha 12/04/2019, 10:44 AM

## 2019-12-05 ENCOUNTER — Inpatient Hospital Stay (HOSPITAL_COMMUNITY): Payer: Medicare PPO | Admitting: Physical Therapy

## 2019-12-05 ENCOUNTER — Ambulatory Visit (HOSPITAL_COMMUNITY): Payer: Medicare PPO | Admitting: Physical Therapy

## 2019-12-05 ENCOUNTER — Encounter (HOSPITAL_COMMUNITY): Payer: Medicare PPO | Admitting: Occupational Therapy

## 2019-12-05 ENCOUNTER — Encounter (HOSPITAL_COMMUNITY): Payer: Medicare PPO | Admitting: Speech Pathology

## 2019-12-05 LAB — GLUCOSE, CAPILLARY
Glucose-Capillary: 105 mg/dL — ABNORMAL HIGH (ref 70–99)
Glucose-Capillary: 93 mg/dL (ref 70–99)
Glucose-Capillary: 119 mg/dL — ABNORMAL HIGH (ref 70–99)
Glucose-Capillary: 116 mg/dL — ABNORMAL HIGH (ref 70–99)

## 2019-12-05 NOTE — Plan of Care (Signed)
  Problem: RH Problem Solving Goal: LTG Patient will demonstrate problem solving for (SLP) Description: LTG:  Patient will demonstrate problem solving for basic/complex daily situations with cues  (SLP) Flowsheets (Taken 12/05/2019 1515) LTG: Patient will demonstrate problem solving for (SLP): Basic daily situations LTG Patient will demonstrate problem solving for: Minimal Assistance - Patient > 75% Note: Goal downgraded due less than anticipated progress and short length of stay, limiting opportunities for progress   Problem: RH Awareness Goal: LTG: Patient will demonstrate awareness during functional activites type of (SLP) Description: LTG: Patient will demonstrate awareness during functional activites type of (SLP) Flowsheets (Taken 12/05/2019 1515) LTG: Patient will demonstrate awareness during cognitive/linguistic activities with assistance of (SLP): Moderate Assistance - Patient 50 - 74% Note: Goal downgraded due less than anticipated progress and short length of stay, limiting opportunities for progress

## 2019-12-05 NOTE — Progress Notes (Signed)
[facility] PHYSICAL MEDICINE & REHABILITATION PROGRESS NOTE  Subjective/Complaints: Patient seen sitting up in her chair this morning.  She states she slept well overnight.  She denies complaints.  ROS: Denies CP, SOB, N/V/D  Objective: Vital Signs: Blood pressure 108/61, pulse (!) 55, temperature 98.1 F (36.7 C), resp. rate 17, height 5\' 8"  (1.727 m), weight 73.8 kg, SpO2 98 %. No results found. Recent Labs    12/03/19 1616  WBC 23.8*  HGB 15.1*  HCT 44.2  PLT 383   Recent Labs    12/03/19 1616  NA 138  K 3.8  CL 101  CO2 26  GLUCOSE 145*  BUN 25*  CREATININE 0.81  CALCIUM 8.9    Physical Exam: BP 108/61 (BP Location: Right Arm)   Pulse (!) 55   Temp 98.1 F (36.7 C)   Resp 17   Ht 5\' 8"  (1.727 m)   Wt 73.8 kg   SpO2 98%   BMI 24.74 kg/m   Constitutional: No distress . Vital signs reviewed. HENT: Normocephalic.  Atraumatic. Crani site.  Eyes: EOMI. No discharge. Cardiovascular: No JVD. Respiratory: Normal effort.  No stridor. GI: Non-distended. Skin: Crani site C/D/I Psych: Normal mood.  Normal behavior. Musc: No edema in extremities.  No tenderness in extremities. Neuro: Alert Follows simple commands RUE/RLE: 4+-5/5 proximal to distal, stable Visual fields intact  Assessment/Plan: 1. Functional deficits secondary to glioma status post resection which require 3+ hours per day of interdisciplinary therapy in a comprehensive inpatient rehab setting.  Physiatrist is providing close team supervision and 24 hour management of active medical problems listed below.  Physiatrist and rehab team continue to assess barriers to discharge/monitor patient progress toward functional and medical goals  Care Tool:  Bathing  Bathing activity did not occur:  (N/A) Body parts bathed by patient: Right arm, Left arm, Chest, Buttocks         Bathing assist Assist Level: Contact Guard/Touching assist     Upper Body Dressing/Undressing Upper body dressing    What is the patient wearing?: Bra, Pull over shirt    Upper body assist Assist Level: Supervision/Verbal cueing    Lower Body Dressing/Undressing Lower body dressing      What is the patient wearing?: Underwear/pull up, Pants     Lower body assist Assist for lower body dressing: Minimal Assistance - Patient > 75%     Toileting Toileting    Toileting assist Assist for toileting: Contact Guard/Touching assist     Transfers Chair/bed transfer  Transfers assist     Chair/bed transfer assist level: Independent with assistive device Chair/bed transfer assistive device: Armrests   Locomotion Ambulation   Ambulation assist      Assist level: Contact Guard/Touching assist Assistive device: No Device Max distance: 167ft   Walk 10 feet activity   Assist     Assist level: Contact Guard/Touching assist Assistive device: No Device   Walk 50 feet activity   Assist    Assist level: Contact Guard/Touching assist Assistive device: No Device    Walk 150 feet activity   Assist    Assist level: Contact Guard/Touching assist Assistive device: No Device    Walk 10 feet on uneven surface  activity   Assist     Assist level: Minimal Assistance - Patient > 75% Assistive device: Other (comment) (no device)   Wheelchair     Assist Will patient use wheelchair at discharge?: No Type of Wheelchair:  (functional ambulator) Wheelchair activity did not occur:  (functional ambulator)  Wheelchair 50 feet with 2 turns activity    Assist    Wheelchair 50 feet with 2 turns activity did not occur:  (functional ambulator)       Wheelchair 150 feet activity     Assist Wheelchair 150 feet activity did not occur:  (functional ambulator)          Medical Problem List and Plan: 1.  Altered mental status with gait abnormality secondary to glioma.  Status post left craniotomy resection of tumor 11/26/2019.    Decadron taper through  7/13.  Continues CIR  Functional field cut noted on discussion with therapies, however no field cut noted on testing 2.  Antithrombotics: -DVT/anticoagulation: Lovenox initiated 11/27/2019             -antiplatelet therapy: N/A 3. Pain Management: Hydrocodone 4. Mood: Provide motional support             -antipsychotic agents: N/A 5. Neuropsych: This patient is ?fully capable of making decisions on her own behalf. 6. Skin/Wound Care: Routine skin checks 7. Fluids/Electrolytes/Nutrition: Routine in and outs 8.  Seizure prophylaxis.  Keppra 500 mg twice daily x1 week, discussed with pharmacy, d/ced 9.  Hypertension.  Clonidine 0.3 mg nightly, Norvasc 5 mg daily.    Controlled on 7/9  Monitor with increased mobility 10.  Hyperlipidemia.  Pravachol 11.  Steroid-induced hyperglycemia on prediabetes.  SSI.  Relatively controlled on 7/9  Monitor with increased mobility 12. 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 F/u pathology. Plan for radiation and chemotherapy- neuro-onc and rad onc are following.  13.  Leukocytosis-likely steroid-induced  WBC 23.8 on 7/7, labs ordered for Monday  Afebrile 14. Transaminitis  ALT elevated on 7/7, labs ordered for Monday  Cont to monitor 15. Moderate Hypoalbuminemia  Supplement initiated  LOS: 3 days A FACE TO FACE EVALUATION WAS PERFORMED  Wanda Ross [name] 12/05/2019, 9:55 AM

## 2019-12-05 NOTE — Progress Notes (Signed)
Occupational Therapy Session Note  Patient Details  Name: Wanda Ross MRN: [id number] Date of Birth: 09-06-1950  Today's Date: 12/05/2019 OT Individual Time: 1000-1100 OT Individual Time Calculation (min): 60 min    Short Term Goals: Week 1:  OT Short Term Goal 1 (Week 1): Pt will attend to R visual field while completing toilet transfer with supervision and LRAD with minimal cueing. OT Short Term Goal 2 (Week 1): Pt will locate objects in R visual field to complete grooming task with supervision and minimal cueing. OT Short Term Goal 3 (Week 1): Pt will don pants with supervision and LRAD.  Skilled Therapeutic Interventions/Progress Updates:    Pt sitting in recliner with dtr present for family education session.  Pt wanting to shower this AM.  OT discussed pts bathroom layout with pt and dtr.  They report pt has tub shower with shower chair.  Pt ambulated to ADL suite bathroom with CGA and VCs to avoid obstacles on right side intermittently.  Pt completed step over tub lip shower transfer with CGA using grab bars for support.  Pt bathed UB and LB with supervision sitting on shower chair.  Pt stepped out of tub using grab bars with CGA.  OT educated pts dtr regarding safety strategies including antislip mat placement, drying floor surfaces and pt thoroughly prior to getting out of tub, and use of bathroom mat only when getting out of tub then taking up. Discussed possible installation of grab bars to increase pt independence and safety.  Pt donned bra, shirt, underwear, and pants with supervision needing min VCs to sequence and problem solve during  UB dressing and mod to max VCs for LB dressing.  Educated pts dtr regarding pts presentation of right inattention and cueing to encourage attention to right side.  Pt ambulated to room with CGA.  Pt able to recall room number and read signs to self direct back to room.  Educated pt and dtr regarding safety strategies in kitchen including  environmental modifications and compensatory techniques.  Educated pt and dtr regarding ensuring good lighting operational from bedside at night to ensure reduced fall risk if needing to toilet.  Pt returned to recliner, call bell in reach, seat alarm on.  Pts dtr reported good understanding of all education and eager to learn throughout session. Pt will have excellent social support at home including brother and sister, dtr and son upon DC to home.     Therapy Documentation Precautions:  Precautions Precautions: Fall Precaution Comments: R inattention Restrictions Weight Bearing Restrictions: No   Therapy/Group: Individual Therapy  Ezekiel Slocumb 12/05/2019, 4:56 PM

## 2019-12-05 NOTE — Plan of Care (Signed)
  Problem: RH Vision Goal: RH LTG Vision (Specify) Outcome: Progressing   Problem: Consults Goal: RH GENERAL PATIENT EDUCATION Description: See Patient Education module for education specifics. Outcome: Progressing Goal: Skin Care Protocol Initiated - if Braden Score 18 or less Description: If consults are not indicated, leave blank or document N/A Outcome: Progressing Goal: Nutrition Consult-if indicated Outcome: Progressing Goal: Diabetes Guidelines if Diabetic/Glucose > 140 Description: If diabetic or lab glucose is > 140 mg/dl - Initiate Diabetes/Hyperglycemia Guidelines & Document Interventions  Outcome: Progressing   Problem: RH BOWEL ELIMINATION Goal: RH STG MANAGE BOWEL WITH ASSISTANCE Description: STG Manage Bowel with mod I Assistance. Outcome: Progressing Goal: RH STG MANAGE BOWEL W/MEDICATION W/ASSISTANCE Description: STG Manage Bowel with Medication with mod I Assistance. Outcome: Progressing   Problem: RH BLADDER ELIMINATION Goal: RH STG MANAGE BLADDER WITH ASSISTANCE Description: STG Manage Bladder With mod I Assistance Outcome: Progressing Goal: RH STG MANAGE BLADDER WITH MEDICATION WITH ASSISTANCE Description: STG Manage Bladder With Medication With mod I Assistance. Outcome: Progressing   Problem: RH SKIN INTEGRITY Goal: RH STG SKIN FREE OF INFECTION/BREAKDOWN Description: Patients skin will remain free from further infection or breakdown with mod I assist. Outcome: Progressing Goal: RH STG MAINTAIN SKIN INTEGRITY WITH ASSISTANCE Description: STG Maintain Skin Integrity With mod I Assistance. Outcome: Progressing Goal: RH STG ABLE TO PERFORM INCISION/WOUND CARE W/ASSISTANCE Description: STG Able To Perform Incision/Wound Care With min Assistance. Outcome: Progressing   Problem: RH SAFETY Goal: RH STG ADHERE TO SAFETY PRECAUTIONS W/ASSISTANCE/DEVICE Description: STG Adhere to Safety Precautions With min Assistance/Device. Outcome: Progressing Goal: RH  STG DECREASED RISK OF FALL WITH ASSISTANCE Description: STG Decreased Risk of Fall With mod I Assistance. Outcome: Progressing   Problem: RH PAIN MANAGEMENT Goal: RH STG PAIN MANAGED AT OR BELOW PT'S PAIN GOAL Description: < 3 Outcome: Progressing

## 2019-12-05 NOTE — Progress Notes (Signed)
Physical Therapy Session Note  Patient Details  Name: Wanda Ross MRN: [id number] Date of Birth: 23-Nov-1950  Today's Date: 12/05/2019 PT Individual Time: 0932-1000 PT Individual Time Calculation (min): 28 min   Short Term Goals: Week 1:  PT Short Term Goal 1 (Week 1): STG = LTG due to ELOS  Skilled Therapeutic Interventions/Progress Updates:    Patient received in bed, daughter present for family education this morning. Demonstrated and had daughter practice bed mobility, functional transfers, gait with no device, fall recovery, stair navigation, and car transfers; also discussed cuing for R inattention as well as other factors such as slow processing and poor safety awareness. Daughter able to guard and assist patient correctly and appropriately, did a great job with cuing her mom in terms of safety especially in busy environments. Patient left up in chair with all needs met, family present and chair alarm active.   Therapy Documentation Precautions:  Precautions Precautions: Fall Precaution Comments: R inattention Restrictions Weight Bearing Restrictions: No   Pain: Pain Assessment Pain Scale: 0-10 Pain Score: 0-No pain    Therapy/Group: Individual Therapy   Windell Norfolk, DPT, PN1   Supplemental Physical Therapist [place]    Pager [phone] Acute Rehab Office [phone]    12/05/2019, 12:38 PM

## 2019-12-05 NOTE — Progress Notes (Signed)
Speech Language Pathology Daily Session Note  Patient Details  Name: Wanda Ross MRN: [id number] Date of Birth: [birth year]  Today's Date: 12/05/2019 SLP Individual Time: 0037-0488 SLP Individual Time Calculation (min): 29 min  Short Term Goals: Week 1: SLP Short Term Goal 1 (Week 1): STG=LTG due to ELOS  Skilled Therapeutic Interventions: Pt was seen for skilled ST targeting education with pt and her daughter (caregiver). SLP facilitated session with functional conversation regarding pt's current cognitive deficits and potential impact on daily functioning. Discussed compensatory strategies to maximize auditory comprehension as well as short term memory with focus on reducing distractions, avoiding multitasking, note taking, associations, and routines. SLP reiterated recommendation for 24/7 supervision for greatest safety at home and total assistance with medication and finance management, as well as other complex tasks such as cooking. Pt's daughter acknowledged all recommendations, and feels confident she and other family members can provide assistance and supervision needed. All questions were answered to pt and daughter's satisfaction. Pt left sitting in bed with alarm set and needs within reach, daughter still present. Continue per current plan of care.        Pain Pain Assessment Pain Scale: 0-10 Pain Score: 0-No pain  Therapy/Group: Individual Therapy  Arbutus Leas 12/05/2019, 9:33 AM

## 2019-12-05 NOTE — Progress Notes (Signed)
Physical Therapy Session Note  Patient Details  Name: Wanda Ross MRN: [id number] Date of Birth: Dec 18, 1950  Today's Date: 12/05/2019 PT Individual Time: 1302-1359 PT Individual Time Calculation (min): 57 min   Short Term Goals: Week 1:  PT Short Term Goal 1 (Week 1): STG = LTG due to ELOS  Skilled Therapeutic Interventions/Progress Updates:    Patient received in recliner, pleasant and willing to participate in session today. Focused session on balance training including tandem stance on foam; SLS, rocker board forward/backward and lateral, ball rolls forward and sideways, narrow BOS and tandem stance on foam while using body blade, and responding to external perturbations while standing on foam as well as keeping balance on foam with eyes closed. Able to gait train multiple distances of 150f with no device and mmin guard with Mod cues for navigating environment especially when presented with hazards on R side. Tolerated riding Nustep 8 minutes on level 5 with BLEs only. Left up in her recliner with all needs met, chair alarm active this afternoon.   Therapy Documentation Precautions:  Precautions Precautions: Fall Precaution Comments: R inattention Restrictions Weight Bearing Restrictions: No Pain: Pain Assessment Pain Scale: 0-10 Pain Score: 0-No pain    Therapy/Group: Individual Therapy   KWindell Norfolk DPT, PN1   Supplemental Physical Therapist [place]   Pager [phone] Rehab Office [phone]  12/05/2019, 3:41 PM

## 2019-12-06 ENCOUNTER — Other Ambulatory Visit: Payer: Self-pay | Admitting: Family Medicine

## 2019-12-06 ENCOUNTER — Inpatient Hospital Stay (HOSPITAL_COMMUNITY): Payer: Medicare PPO | Admitting: Physical Therapy

## 2019-12-06 ENCOUNTER — Inpatient Hospital Stay (HOSPITAL_COMMUNITY): Payer: Medicare PPO

## 2019-12-06 DIAGNOSIS — D72829 Elevated white blood cell count, unspecified: Secondary | ICD-10-CM

## 2019-12-06 LAB — GLUCOSE, CAPILLARY
Glucose-Capillary: 95 mg/dL (ref 70–99)
Glucose-Capillary: 102 mg/dL — ABNORMAL HIGH (ref 70–99)
Glucose-Capillary: 166 mg/dL — ABNORMAL HIGH (ref 70–99)
Glucose-Capillary: 110 mg/dL — ABNORMAL HIGH (ref 70–99)

## 2019-12-06 NOTE — Progress Notes (Signed)
Occupational Therapy Session Note  Patient Details  Name: Wanda Ross MRN: [id number] Date of Birth: February 16, 1951  Today's Date: 12/06/2019 OT Individual Time: 7741-2878 OT Individual Time Calculation (min): 75 min    Short Term Goals: Week 1:  OT Short Term Goal 1 (Week 1): Pt will attend to R visual field while completing toilet transfer with supervision and LRAD with minimal cueing. OT Short Term Goal 2 (Week 1): Pt will locate objects in R visual field to complete grooming task with supervision and minimal cueing. OT Short Term Goal 3 (Week 1): Pt will don pants with supervision and LRAD.  Skilled Therapeutic Interventions/Progress Updates:    OT session focused on ADL retraining, attention to R side, and cognitive skills. Pt received in bed agreeable to bathing/dressing. Pt ambulated throughout room with CGA as retrieved clothing items with min assist due to attempts to retrieve 2 shirts and 0 pants. Completed showering with SBA and min VC's for thoroughness. Following dressing with SBA for balance, pt ambulated to sink to complete oral care in standing x2 minutes. Provided rest break then ambulated to ADL apartment with CGA and LOB to right side 2x requiring min A to correct. Completed kitchen safety activity with pt locating 5 unsafe items with increased time then required min cues to identify solution to unsafe situations. Transitioned to therapy gym to complete peg board designs focusing on attention R, problem solving skills, and spatial awareness. Pt completed + design with max assist then completed ABAB pattern design with min errors. Ambulated back to room with mod cues for path finding. Pt left sitting in recliner with safety belt and all needs in reach.   Therapy Documentation Precautions:  Precautions Precautions: Fall Precaution Comments: R inattention Restrictions Weight Bearing Restrictions: No General:   Vital Signs:   Pain: Pain Assessment Pain Score: 0-No  pain ADL: ADL Eating: Independent Where Assessed-Eating: Wheelchair Grooming: Supervision/safety Where Assessed-Grooming: Standing at sink, Sitting at sink Upper Body Bathing: Supervision/safety Where Assessed-Upper Body Bathing: Shower Lower Body Bathing: Supervision/safety Where Assessed-Lower Body Bathing: Shower Upper Body Dressing: Supervision/safety Where Assessed-Upper Body Dressing: Wheelchair Lower Body Dressing: Minimal assistance Where Assessed-Lower Body Dressing: Wheelchair Toileting: Therapist, music: Minimal Print production planner Method: Counselling psychologist: Emergency planning/management officer Transfer: Minimal assistance Social research officer, government Method: Heritage manager: Civil engineer, contracting with back Glass blower/designer   Exercises:   Other Treatments:     Therapy/Group: Individual Therapy  Duayne Cal 12/06/2019, 12:13 PM

## 2019-12-06 NOTE — Progress Notes (Signed)
Physical Therapy Session Note  Patient Details  Name: [name] MRN: [id number] Date of Birth: 07-09-1950  Today's Date: 12/06/2019 PT Individual Time: 1302-1400 PT Individual Time Calculation (min): 58 min   Short Term Goals: Week 1:  PT Short Term Goal 1 (Week 1): STG = LTG due to ELOS  Skilled Therapeutic Interventions/Progress Updates:   Pt presents sitting in recliner and agreeable to therapy.  Pt amb multiple trials w/o AD and CGA to SBA up to 200' including turns and verbal and visual cues for attention to right.  Pt negotiated curb-height step w/o UE support but CGA to min A.  Pt had no LOB, but somewhat increased BOS.  Pt performed dynamic, high-level balance activities including stop/starts, looking side to side during gait, obstacle course through cones (following 1-2 step directions), picking up cones, stepping through "floor ladder" forward and sidestepping,  as well as tandem gait.  Min A for occasional LOB w/ tandem and "ladder" negotiation.  Pt returned to recliner w/ chair alarm on and all needs in reach.     Therapy Documentation Precautions:  Precautions Precautions: Fall Precaution Comments: R inattention Restrictions Weight Bearing Restrictions: No General:   Vital Signs:   Pain: 0/10   Mobility:      Therapy/Group: Individual Therapy  Ladoris Gene 12/06/2019, 2:05 PM

## 2019-12-06 NOTE — Progress Notes (Signed)
[facility] PHYSICAL MEDICINE & REHABILITATION PROGRESS NOTE  Subjective/Complaints: Patient seen sitting up in bed this morning.  She states she slept well overnight.  She is appreciative of her care.  Later seen ambulating with therapy as well.  ROS: Denies CP, SOB, N/V/D  Objective: Vital Signs: Blood pressure 120/66, pulse 77, temperature 98.3 F (36.8 C), resp. rate 17, height 5\' 8"  (1.727 m), weight 73.8 kg, SpO2 98 %. No results found. Recent Labs    12/03/19 1616  WBC 23.8*  HGB 15.1*  HCT 44.2  PLT 383   Recent Labs    12/03/19 1616  NA 138  K 3.8  CL 101  CO2 26  GLUCOSE 145*  BUN 25*  CREATININE 0.81  CALCIUM 8.9    Physical Exam: BP 120/66 (BP Location: Left Arm)   Pulse 77   Temp 98.3 F (36.8 C)   Resp 17   Ht 5\' 8"  (1.727 m)   Wt 73.8 kg   SpO2 98%   BMI 24.74 kg/m   Constitutional: No distress . Vital signs reviewed. HENT: Normocephalic.  Crani site.  Eyes: EOMI. No discharge. Cardiovascular: No JVD. Respiratory: Normal effort.  No stridor. GI: Non-distended. Skin: Crani site with staples C/D/I Psych: Normal mood.  Normal behavior. Musc: No edema in extremities.  No tenderness in extremities. Neuro: Alert Follows simple commands RUE/RLE: 4+-5/5 proximal to distal, unchanged Visual fields intact  Assessment/Plan: 1. Functional deficits secondary to glioma status post resection which require 3+ hours per day of interdisciplinary therapy in a comprehensive inpatient rehab setting.  Physiatrist is providing close team supervision and 24 hour management of active medical problems listed below.  Physiatrist and rehab team continue to assess barriers to discharge/monitor patient progress toward functional and medical goals  Care Tool:  Bathing  Bathing activity did not occur:  (N/A) Body parts bathed by patient: Right arm, Face, Left arm, Chest, Abdomen, Front perineal area, Buttocks, Right upper leg, Left upper leg, Right lower leg, Left  lower leg         Bathing assist Assist Level: Supervision/Verbal cueing     Upper Body Dressing/Undressing Upper body dressing Upper body dressing/undressing activity did not occur (including orthotics): N/A What is the patient wearing?: Pull over shirt, Bra    Upper body assist Assist Level: Set up assist    Lower Body Dressing/Undressing Lower body dressing    Lower body dressing activity did not occur: N/A What is the patient wearing?: Underwear/pull up, Pants     Lower body assist Assist for lower body dressing: Set up assist     Toileting Toileting    Toileting assist Assist for toileting: Contact Guard/Touching assist     Transfers Chair/bed transfer  Transfers assist     Chair/bed transfer assist level: Contact Guard/Touching assist Chair/bed transfer assistive device: Armrests   Locomotion Ambulation   Ambulation assist      Assist level: Contact Guard/Touching assist Assistive device: No Device Max distance: 200   Walk 10 feet activity   Assist     Assist level: Contact Guard/Touching assist Assistive device: No Device   Walk 50 feet activity   Assist    Assist level: Contact Guard/Touching assist Assistive device: No Device    Walk 150 feet activity   Assist    Assist level: Contact Guard/Touching assist Assistive device: No Device    Walk 10 feet on uneven surface  activity   Assist     Assist level: Minimal Assistance - Patient >  75% Assistive device: Other (comment) (no device)   Wheelchair     Assist Will patient use wheelchair at discharge?: No Type of Wheelchair:  (functional ambulator) Wheelchair activity did not occur:  (functional ambulator)         Wheelchair 50 feet with 2 turns activity    Assist    Wheelchair 50 feet with 2 turns activity did not occur:  (functional ambulator)       Wheelchair 150 feet activity     Assist Wheelchair 150 feet activity did not occur:  (functional  ambulator)          Medical Problem List and Plan: 1.  Altered mental status with gait abnormality secondary to glioma.  Status post left craniotomy resection of tumor 11/26/2019.    Decadron taper through 7/13.  Continue CIR  Functional field cut noted on discussion with therapies, however no field cut noted on testing 2.  Antithrombotics: -DVT/anticoagulation: Lovenox initiated 11/27/2019             -antiplatelet therapy: N/A 3. Pain Management: Hydrocodone 4. Mood: Provide motional support             -antipsychotic agents: N/A 5. Neuropsych: This patient is ?fully capable of making decisions on her own behalf. 6. Skin/Wound Care: Routine skin checks 7. Fluids/Electrolytes/Nutrition: Routine in and outs 8.  Seizure prophylaxis.  Keppra 500 mg twice daily x1 week, discussed with pharmacy, d/ced 9.  Hypertension.  Clonidine 0.3 mg nightly, Norvasc 5 mg daily.    Controlled on 11/10  Monitor with increased mobility 10.  Hyperlipidemia.  Pravachol 11.  Steroid-induced hyperglycemia on prediabetes.  SSI.  Relatively controlled on 7/10  Monitor with increased mobility 12. 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 F/u pathology?  Pending-unable to locate for review. Plan for radiation and chemotherapy- neuro-onc and rad onc are following.  13.  Leukocytosis-likely steroid-induced  WBC 23.8 on 7/7, labs ordered for Monday  Afebrile 14. Transaminitis  ALT elevated on 7/7, labs ordered for Monday  Cont to monitor 15. Moderate Hypoalbuminemia  Supplement initiated  LOS: 4 days A FACE TO FACE EVALUATION WAS PERFORMED  Wanda Ross [name] 12/06/2019, 2:52 PM

## 2019-12-06 NOTE — Progress Notes (Signed)
Speech Language Pathology Daily Session Note  Patient Details  Name: Wanda Ross MRN: [id number] Date of Birth: Feb 10, 1951  Today's Date: 12/06/2019 SLP Individual Time: 1021-1173 SLP Individual Time Calculation (min): 57 min  Short Term Goals: Week 1: SLP Short Term Goal 1 (Week 1): STG=LTG due to ELOS  Skilled Therapeutic Interventions: Skilled ST services focused on cognitive skills. SLP facilitated basic problem solving and recall utilizing novel card task (Blink), pt required max A verbal cues for comprehension of task and supervision A verbal cues for recall of 3 rules. Pt was aware of comprehension difficulty. SLP also facilitated familiar (preformed with other therapy disciplines) PEG design tasks, pt demonstrated basic problem solving with simple design requiring supervision A verbal cues for organization when checking for errors, however the semi-complex designs required max A verbal cues for executive function skills and error awareness. Pt demonstrates impairment in whole to parts/ top to bottom processing, difficultly seeing the big picture/main idea which impacts her ability to problem solve/organize. SLP instructed in strategies to identity plan/ main idea/purpose prior to action to aid in problem solving. SLP recommends utilizing strategy in problem solving tasks during upcoming treatment sessions. Pt was left in room with call bell within reach and bed/chair alarm set. ST recommends to continue skilled ST services.      Pain Pain Assessment Pain Score: 0-No pain  Therapy/Group: Individual Therapy  [place]  [facility] 12/06/2019, 9:37 AM

## 2019-12-07 LAB — GLUCOSE, CAPILLARY
Glucose-Capillary: 106 mg/dL — ABNORMAL HIGH (ref 70–99)
Glucose-Capillary: 124 mg/dL — ABNORMAL HIGH (ref 70–99)
Glucose-Capillary: 92 mg/dL (ref 70–99)
Glucose-Capillary: 93 mg/dL (ref 70–99)

## 2019-12-07 NOTE — Progress Notes (Signed)
[place] PHYSICAL MEDICINE & REHABILITATION PROGRESS NOTE  Subjective/Complaints: Patient seen sitting up in bed this morning.  She states she slept well overnight.  She denies complaints.  ROS: Denies CP, SOB, N/V/D  Objective: Vital Signs: Blood pressure 111/60, pulse 62, temperature 98.4 F (36.9 C), resp. rate 16, height 5\' 8"  (1.727 m), weight 73.8 kg, SpO2 98 %. No results found. No results for input(s): WBC, HGB, HCT, PLT in the last 72 hours. No results for input(s): NA, K, CL, CO2, GLUCOSE, BUN, CREATININE, CALCIUM in the last 72 hours.  Physical Exam: BP 111/60 (BP Location: Right Arm)   Pulse 62   Temp 98.4 F (36.9 C)   Resp 16   Ht 5\' 8"  (1.727 m)   Wt 73.8 kg   SpO2 98%   BMI 24.74 kg/m   Constitutional: No distress . Vital signs reviewed. HENT: Normocephalic.  Atraumatic. Crani site. Eyes: EOMI. No discharge. Cardiovascular: No JVD. Respiratory: Normal effort.  No stridor. GI: Non-distended. Skin: Warm and dry.  Intact. Staples C/D/I. Psych: Normal mood.  Normal behavior. Musc: No edema in extremities.  No tenderness in extremities. Neuro: Alert Follows simple commands RUE/RLE: 4+-5/5 proximal to distal, stable Visual fields intact  Assessment/Plan: 1. Functional deficits secondary to glioma status post resection which require 3+ hours per day of interdisciplinary therapy in a comprehensive inpatient rehab setting.  Physiatrist is providing close team supervision and 24 hour management of active medical problems listed below.  Physiatrist and rehab team continue to assess barriers to discharge/monitor patient progress toward functional and medical goals  Care Tool:  Bathing  Bathing activity did not occur:  (N/A) Body parts bathed by patient: Right arm, Face, Left arm, Chest, Abdomen, Front perineal area, Buttocks, Right upper leg, Left upper leg, Right lower leg, Left lower leg         Bathing assist Assist Level: Supervision/Verbal cueing      Upper Body Dressing/Undressing Upper body dressing Upper body dressing/undressing activity did not occur (including orthotics): N/A What is the patient wearing?: Pull over shirt, Bra    Upper body assist Assist Level: Set up assist    Lower Body Dressing/Undressing Lower body dressing    Lower body dressing activity did not occur: N/A What is the patient wearing?: Underwear/pull up, Pants     Lower body assist Assist for lower body dressing: Set up assist     Toileting Toileting    Toileting assist Assist for toileting: Contact Guard/Touching assist     Transfers Chair/bed transfer  Transfers assist     Chair/bed transfer assist level: Contact Guard/Touching assist Chair/bed transfer assistive device: Armrests   Locomotion Ambulation   Ambulation assist      Assist level: Contact Guard/Touching assist Assistive device: No Device Max distance: 200   Walk 10 feet activity   Assist     Assist level: Contact Guard/Touching assist Assistive device: No Device   Walk 50 feet activity   Assist    Assist level: Contact Guard/Touching assist Assistive device: No Device    Walk 150 feet activity   Assist    Assist level: Contact Guard/Touching assist Assistive device: No Device    Walk 10 feet on uneven surface  activity   Assist     Assist level: Minimal Assistance - Patient > 75% Assistive device: Other (comment) (no device)   Wheelchair     Assist Will patient use wheelchair at discharge?: No Type of Wheelchair:  (functional ambulator) Wheelchair activity did not occur:  (  functional ambulator)         Wheelchair 50 feet with 2 turns activity    Assist    Wheelchair 50 feet with 2 turns activity did not occur:  (functional ambulator)       Wheelchair 150 feet activity     Assist Wheelchair 150 feet activity did not occur:  (functional ambulator)          Medical Problem List and Plan: 1.  Altered mental  status with gait abnormality secondary to glioma.  Status post left craniotomy resection of tumor 11/26/2019.    Decadron taper through 7/13.  Continue CIR  Functional field cut noted on discussion with therapies, however no field cut noted on testing 2.  Antithrombotics: -DVT/anticoagulation: Lovenox initiated 11/27/2019             -antiplatelet therapy: N/A 3. Pain Management: Hydrocodone 4. Mood: Provide motional support             -antipsychotic agents: N/A 5. Neuropsych: This patient is ?fully capable of making decisions on her own behalf. 6. Skin/Wound Care: Routine skin checks 7. Fluids/Electrolytes/Nutrition: Routine in and outs 8.  Seizure prophylaxis.  Keppra 500 mg twice daily x1 week, discussed with pharmacy, d/ced 9.  Hypertension.  Clonidine 0.3 mg nightly, Norvasc 5 mg daily.    Controlled on 7/11  Monitor with increased mobility 10.  Hyperlipidemia.  Pravachol 11.  Steroid-induced hyperglycemia on prediabetes.  SSI.  Relatively controlled on 7/11  Monitor with increased mobility 12. 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 F/u pathology?  Appears to still be pending -unable to locate for review. Plan for radiation and chemotherapy- neuro-onc and rad onc are following.  13.  Leukocytosis-likely steroid-induced  WBC 23.8 on 7/7, labs ordered for tomorrow  Afebrile 14. Transaminitis  ALT elevated on 7/7, labs ordered for tomorrow  Cont to monitor 15. Moderate Hypoalbuminemia  Supplement initiated  LOS: 5 days A FACE TO FACE EVALUATION WAS PERFORMED  Wanda Ross Wanda Ross 12/07/2019, 2:47 PM

## 2019-12-08 ENCOUNTER — Inpatient Hospital Stay (HOSPITAL_COMMUNITY): Payer: Medicare PPO | Admitting: Occupational Therapy

## 2019-12-08 ENCOUNTER — Inpatient Hospital Stay (HOSPITAL_COMMUNITY): Payer: Medicare PPO | Admitting: Speech Pathology

## 2019-12-08 ENCOUNTER — Inpatient Hospital Stay: Payer: Medicare PPO | Attending: Internal Medicine

## 2019-12-08 ENCOUNTER — Inpatient Hospital Stay (HOSPITAL_COMMUNITY): Payer: Medicare PPO | Admitting: Physical Therapy

## 2019-12-08 DIAGNOSIS — C713 Malignant neoplasm of parietal lobe: Secondary | ICD-10-CM | POA: Insufficient documentation

## 2019-12-08 DIAGNOSIS — Z803 Family history of malignant neoplasm of breast: Secondary | ICD-10-CM | POA: Insufficient documentation

## 2019-12-08 LAB — GLUCOSE, CAPILLARY
Glucose-Capillary: 93 mg/dL (ref 70–99)
Glucose-Capillary: 95 mg/dL (ref 70–99)

## 2019-12-08 LAB — CBC WITH DIFFERENTIAL/PLATELET
Abs Immature Granulocytes: 0.14 10*3/uL — ABNORMAL HIGH (ref 0.00–0.07)
Basophils Absolute: 0 10*3/uL (ref 0.0–0.1)
Basophils Relative: 0 %
Eosinophils Absolute: 0.1 10*3/uL (ref 0.0–0.5)
Eosinophils Relative: 1 %
HCT: 40.4 % (ref 36.0–46.0)
Hemoglobin: 13.3 g/dL (ref 12.0–15.0)
Immature Granulocytes: 1 %
Lymphocytes Relative: 11 %
Lymphs Abs: 1.1 10*3/uL (ref 0.7–4.0)
MCH: 28.5 pg (ref 26.0–34.0)
MCHC: 32.9 g/dL (ref 30.0–36.0)
MCV: 86.5 fL (ref 80.0–100.0)
Monocytes Absolute: 0.9 10*3/uL (ref 0.1–1.0)
Monocytes Relative: 9 %
Neutro Abs: 8 10*3/uL — ABNORMAL HIGH (ref 1.7–7.7)
Neutrophils Relative %: 78 %
Platelets: 267 10*3/uL (ref 150–400)
RBC: 4.67 MIL/uL (ref 3.87–5.11)
RDW: 13.5 % (ref 11.5–15.5)
WBC: 10.2 10*3/uL (ref 4.0–10.5)
nRBC: 0 % (ref 0.0–0.2)

## 2019-12-08 LAB — COMPREHENSIVE METABOLIC PANEL
ALT: 25 U/L (ref 0–44)
AST: 15 U/L (ref 15–41)
Albumin: 2.8 g/dL — ABNORMAL LOW (ref 3.5–5.0)
Alkaline Phosphatase: 36 U/L — ABNORMAL LOW (ref 38–126)
Anion gap: 8 (ref 5–15)
BUN: 17 mg/dL (ref 8–23)
CO2: 28 mmol/L (ref 22–32)
Calcium: 8.5 mg/dL — ABNORMAL LOW (ref 8.9–10.3)
Chloride: 104 mmol/L (ref 98–111)
Creatinine, Ser: 0.75 mg/dL (ref 0.44–1.00)
GFR calc Af Amer: >60 mL/min (ref >60)
GFR calc non Af Amer: >60 mL/min (ref >60)
Glucose, Bld: 100 mg/dL — ABNORMAL HIGH (ref 70–99)
Potassium: 4.5 mmol/L (ref 3.5–5.1)
Sodium: 140 mmol/L (ref 135–145)
Total Bilirubin: 0.4 mg/dL (ref 0.3–1.2)
Total Protein: 5.3 g/dL — ABNORMAL LOW (ref 6.5–8.1)

## 2019-12-08 NOTE — Progress Notes (Addendum)
Physical Therapy Session Note  Patient Details  Name: SHANTICE MENGER MRN: [id number] Date of Birth: [birth year]  Today's Date: 12/08/2019 PT Individual Time: 1310-1349 PT Individual Time Calculation (min): 39 min   Short Term Goals: Week 1:  PT Short Term Goal 1 (Week 1): STG = LTG due to ELOS  Skilled Therapeutic Interventions/Progress Updates:  Pt received in recliner & agreeable to tx. Sit<>stand throughout session with supervision. Gait from unit<>outside north tower without AD with CGA with pt demonstrating decreased RUE arm swing and decreased RLE heel strike but pt is able to dorsiflex R ankle in sitting. Educated pt on need to increase heel strike during gait & pt is able to return demonstrate with concentration. Gait over uneven surface outside without AD & CGA. Pt demonstrates R inattention throughout session with PT educating her on this and need for increased safety & awareness when she returns home due to this. Pt presses elevator buttons with RUE for forced use NMR & instructed pt to use RUE as much as possible for NMR, especially since pt is L hand dominant. Back in room, retrograde gait x 12 ft x 4 without AD & min assist for high level balance training. 10x sit<>stand without BUE support & supervision for BLE strengthening. Pt left in recliner with chair alarm donned & call bell & all needs in reach.  Therapy Documentation Precautions:  Precautions Precautions: Fall Precaution Comments: R inattention Restrictions Weight Bearing Restrictions: No  Pain: No c/o pain   Therapy/Group: Individual Therapy  Waunita Schooner 12/08/2019, 3:23 PM

## 2019-12-08 NOTE — Progress Notes (Signed)
Occupational Therapy Session Note  Patient Details  Name: Wanda Ross MRN: [id number] Date of Birth: Nov 15, 1950  Today's Date: 12/08/2019 OT Individual Time: 1000-1100 OT Individual Time Calculation (min): 60 min    Short Term Goals: Week 1:  OT Short Term Goal 1 (Week 1): Pt will attend to R visual field while completing toilet transfer with supervision and LRAD with minimal cueing. OT Short Term Goal 2 (Week 1): Pt will locate objects in R visual field to complete grooming task with supervision and minimal cueing. OT Short Term Goal 3 (Week 1): Pt will don pants with supervision and LRAD.  Skilled Therapeutic Interventions/Progress Updates:    Pt sitting up in recliner no complaints of pain.  Instructed pt through tabletop problem solving task using 24 piece puzzle.  Pt needing increased time and min VCs for problem solving strategies to complete.  Pt then completed bathing in walk in shower including ADL item retrieval component.  Pt needing CGA and mod VCs to assist with organizing, problem solving, and staying on task when gathering ADL items in room in preperation for bathing and dressing.  Pt bathed UB and LB with supervision sitting/standing.  Pt completed UB and LB dressing with supervision and increased time for processing.  Pt ambulated to sink and brushed teeth in standing with CGA to supervision. Pt returned to recliner with CGA.  Pt call bell in reach, seat alarm on at end of session.    Therapy Documentation Precautions:  Precautions Precautions: Fall Precaution Comments: R inattention Restrictions Weight Bearing Restrictions: No   Therapy/Group: Individual Therapy  Ezekiel Slocumb 12/08/2019, 12:51 PM

## 2019-12-08 NOTE — Progress Notes (Signed)
Speech Language Pathology Daily Session Note  Patient Details  Name: Wanda Ross MRN: [id number] Date of Birth: [birth year]  Today's Date: 12/08/2019 SLP Individual Time: 0827-0927 SLP Individual Time Calculation (min): 60 min  Short Term Goals: Week 1: SLP Short Term Goal 1 (Week 1): STG=LTG due to ELOS  Skilled Therapeutic Interventions: Pt was seen for skilled ST targeting cognition. Pt required Mod quickly faded to Min A verbal and visual cues for functional problem solving and error awareness during a basic 3-step action card sequencing task. During a medication management task, pt recalled current medications, functions, and dosgaes with Supervision A verbla cues and use list as compensatoyr aid, also with Supervision A. When organizing a BID pill box according to her list of medications, Min A verbal and visual cues were required for functional problem solving and error awareness to administer basic 1X daily pills (AM or PM), but Max A required to comprehend and demonstrate appropriate adminstration of more complex 2X daily pills. Pt left sitting in recliner with alarm set and needs within reach. Continue per current plan of care.        Pain Pain Assessment Pain Scale: 0-10 Pain Score: 0-No pain  Therapy/Group: Individual Therapy  Wanda Ross 12/08/2019, 10:21 AM

## 2019-12-08 NOTE — Progress Notes (Signed)
Occupational Therapy Session Note  Patient Details  Name: Wanda Ross MRN: [id number] Date of Birth: 07/23/1950  Today's Date: 12/08/2019 OT Individual Time: 1130-1200 OT Individual Time Calculation (min): 30 min    Short Term Goals: Week 1:  OT Short Term Goal 1 (Week 1): Pt will attend to R visual field while completing toilet transfer with supervision and LRAD with minimal cueing. OT Short Term Goal 2 (Week 1): Pt will locate objects in R visual field to complete grooming task with supervision and minimal cueing. OT Short Term Goal 3 (Week 1): Pt will don pants with supervision and LRAD.     Skilled Therapeutic Interventions/Progress Updates:    Pt seen this session for education on R visual field loss compensatory strategies and safety issues.  Pt ambulated to ADL apt with occasional CGA and cues to scan her head/eyes to the R.  Pt given instructions about which doorway to turn into but passed it so had her step back and turn head to R to find doorway.  Sat on couch in apt to talk about light house scanning strategy. Pt practiced that strategy to find 25 pieces of plastic fruit placed in various areas of the kitchen. She found 23 without cues.   Discussed kitchen safety, pt states she does not cook anymore, buys precut fruit and veggies, pre made meals and orders out.  Reviewed kitchen safety for "just in case" discussing concerns with getting cut or burned.  Used simulated situations and practiced having her take a large cookie sheet in and out of the oven with cue to scan to find R end of cookie sheet.  Practiced placing pans on stove and reviewed safety tips of how to ensure she is not accidentally placing R hand to close to hot areas of stove.  She is L handed so she can stand with her body at a L 45 degree angle to stove and to counter if she needs to cut food.  With B hands in front of her on counter top, she can see her right hand to avoid contacting it with a knife.   Also  recommended, if she should do any meal prep to make sure another adult is with her.   Pt ambulated back to her room with occasional CG A as she seems to become slightly unsteady when she stops to look at people in the hall way.  She did find her room without cues. Pt resting in recliner with alarm on and all needs met.   Therapy Documentation Precautions:  Precautions Precautions: Fall Precaution Comments: R inattention Restrictions Weight Bearing Restrictions: No Pain: Pain Assessment Pain Scale: 0-10 Pain Score: 0-No pain ADL: ADL Eating: Independent Where Assessed-Eating: Wheelchair Grooming: Supervision/safety Where Assessed-Grooming: Standing at sink, Sitting at sink Upper Body Bathing: Supervision/safety Where Assessed-Upper Body Bathing: Shower Lower Body Bathing: Supervision/safety Where Assessed-Lower Body Bathing: Shower Upper Body Dressing: Supervision/safety Where Assessed-Upper Body Dressing: Wheelchair Lower Body Dressing: Minimal assistance Where Assessed-Lower Body Dressing: Wheelchair Toileting: Therapist, music: Minimal Print production planner Method: Counselling psychologist: Emergency planning/management officer Transfer: Minimal assistance Social research officer, government Method: Heritage manager: Shower seat with back   Therapy/Group: Individual Therapy  [place] 12/08/2019, 8:29 AM

## 2019-12-08 NOTE — Discharge Summary (Addendum)
Physician Discharge Summary  Patient ID: Wanda Ross MRN: [id number] DOB/AGE: 02-11-1951 69 y.o.  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  Newly discovered brain mass. EXAM: CHEST - 2 VIEW COMPARISON:  None. FINDINGS: The heart size and mediastinal contours are within normal limits. Both lungs are clear. The visualized skeletal structures are unremarkable. IMPRESSION: No active cardiopulmonary disease. Electronically Signed   By: Ulyses Jarred M.D.   On: 11/22/2019 04:03   CT Head Wo Contrast  Result Date: 11/22/2019 CLINICAL DATA:  Altered level of consciousness for 4 days, dizziness EXAM: CT HEAD WITHOUT CONTRAST TECHNIQUE: Contiguous axial images were obtained from the base of the skull through the vertex without intravenous contrast. COMPARISON:  None. FINDINGS: Brain: There is significant vasogenic edema in the left frontal parietal region. 3.2 cm mass is seen within the left parietal white matter. MRI with and without contrast is recommended for further evaluation. Hypodensity extends along the posterior aspect of the corpus callosum, either extension of the mass or edema. I do not see any definite hemorrhage. There is significant effacement of the left lateral ventricle, with rightward midline shift measuring up to 4 mm at the septum pellucidum. There is diffuse sulcal effacement throughout the left cerebral hemisphere, greatest along the convexity. No acute extra-axial fluid collections. Vascular: No hyperdense vessel or unexpected calcification. Skull: Normal. Negative for  fracture or focal lesion. Sinuses/Orbits: Small metallic densities are seen within the globes bilaterally, likely related to prior glaucoma surgery. Please correlate with surgical history. Otherwise the orbits are unremarkable. Other: None. IMPRESSION: 1. 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 MRI with and without contrast is recommended. 2. Extensive vasogenic edema throughout the left cerebral hemisphere, with significant sulcal effacement and minimal rightward midline shift. Significant effacement of the left lateral ventricle. These results were called by telephone at the time of interpretation on 11/22/2019 at 12:34 am to provider IVA KNAPP , who verbally acknowledged these results. 1. Extensive cerebral edema Electronically Signed   By: Randa Ngo M.D.   On: 11/22/2019 00:34   CT CHEST W CONTRAST  Result Date: 11/23/2019 CLINICAL DATA:  69 y.o. female was brought to the ED due to confusion and gait imbalance. As per patient daughter, patient was having intermittent confusion. Patient denies chest pain, shortness of breath, nausea, vomiting or diarrhea. Mass seen on CT Head and Mri. EXAM: CT CHEST, ABDOMEN, AND PELVIS WITH CONTRAST TECHNIQUE: Multidetector CT imaging of the chest, abdomen and pelvis was performed following the standard protocol during bolus administration of intravenous contrast. CONTRAST:  177mL OMNIPAQUE IOHEXOL 300 MG/ML  SOLN COMPARISON:  Head CT and brain MRI from 11/22/2019 FINDINGS: CT CHEST FINDINGS Cardiovascular: Heart is normal in size and configuration. No pericardial effusion. No coronary artery calcifications. Great vessels are normal in caliber. No aortic atherosclerosis. Mediastinum/Nodes: No enlarged mediastinal, hilar, or axillary lymph nodes. Thyroid gland, trachea, and esophagus demonstrate no significant findings. Lungs/Pleura: Minor scarring at  the base of the left upper lobe lingula. Minimal linear dependent lower lobe opacities consistent with atelectasis. Lungs otherwise  clear with no masses or nodules. No pleural effusion or pneumothorax. Musculoskeletal: No fracture or acute finding. No osteoblastic or osteolytic lesions. CT ABDOMEN PELVIS FINDINGS Hepatobiliary: Liver normal in size and overall attenuation. Small low-density lesion between the anterior segment the right lobe and medial segment of the left, consistent with a cyst. Small amount of focal fat adjacent to the falciform ligament. No other abnormalities. Normal gallbladder. No bile duct dilation. Pancreas: Unremarkable. No pancreatic ductal dilatation or surrounding inflammatory changes. Spleen: Normal in size without focal abnormality. Adrenals/Urinary Tract: No adrenal masses. Kidneys normal in size, orientation and position with symmetric enhancement and excretion. Bilateral low-attenuation renal masses consistent with cysts, largest from the lower pole of the left kidney, 4.5 cm. No renal stones. No hydronephrosis. Normal ureters. Normal bladder. Stomach/Bowel: Small hiatal hernia. Stomach is otherwise within normal limits. Appendix appears normal. No evidence of bowel wall thickening, distention, or inflammatory changes. Vascular/Lymphatic: No significant vascular abnormality. No enlarged lymph nodes. Reproductive: Uterus and bilateral adnexa are unremarkable. Other: No abdominal wall hernia or abnormality. No abdominopelvic ascites. Musculoskeletal: No fracture or acute finding. No osteoblastic or osteolytic lesions. IMPRESSION: 1. No acute findings within the chest, abdomen or pelvis. 2. No evidence of a primary malignancy within the chest, abdomen or pelvis. 3. Small low-density liver lesion consistent with a cyst. Bilateral renal cysts. Electronically Signed   By: Lajean Manes M.D.   On: 11/23/2019 14:38   MR BRAIN W WO CONTRAST  Result Date: 11/27/2019 CLINICAL DATA:  Postop  day 1 following resection of a left parietal tumor (likely high-grade glioma). EXAM: MRI HEAD WITHOUT AND WITH CONTRAST TECHNIQUE: Multiplanar, multiecho pulse sequences of the brain and surrounding structures were obtained without and with intravenous contrast. CONTRAST:  10mL GADAVIST GADOBUTROL 1 MMOL/ML IV SOLN COMPARISON:  11/22/2019 FINDINGS: Brain: Sequelae of interval left parietal craniotomy and tumor resection are identified. There is a resection cavity in the left parietal lobe and splenium of the corpus callosum with a small amount of blood products. A small amount of restricted diffusion is noted along the margins of the resection cavity without a sizable acute infarct. There is a small amount of enhancement along the margins of the resection cavity which is largely curvilinear and likely postoperative. There is more nodular enhancement at the anterior aspect of the resection site at the level of the atrium of the left lateral ventricle which is felt to at least partially reflect choroid plexus. There is moderate nonenhancing T2 hyperintensity/edema in the white matter of the left parietal, occipital, and posterior temporal lobes which has decreased from the prior study. There is persistent nonenhancing T2 hyperintensity which crosses the splenium of the corpus callosum into the right periatrial white matter. There is trace residual rightward midline shift, decreased from prior. There is small volume pneumocephalus, and there is a small extra-axial fluid collection subjacent to the craniotomy. Mild dural thickening and enhancement over the posterior left cerebral convexity is likely postoperative. Vascular: Major intracranial vascular flow voids are preserved. Skull and upper cervical spine: Left parietal craniotomy with small volume fluid and gas in the scalp soft tissues. Sinuses/Orbits: Left cataract extraction. Paranasal sinuses and mastoid air cells are clear. Other: None. IMPRESSION: 1.  Postoperative changes from interval left parietal and splenial tumor resection. Mild enhancement along the margins of the resection cavity as above. This will serve as a baseline for follow-up examinations. 2. Decreased surrounding T2 hyperintensity compatible with decreased edema and likely underlying nonenhancing tumor. Electronically Signed   By:  Logan Bores M.D.   On: 11/27/2019 13:58   MR BRAIN W WO CONTRAST  Result Date: 11/23/2019 CLINICAL DATA:  Intracranial mass EXAM: MRI HEAD WITHOUT AND WITH CONTRAST TECHNIQUE: Multiplanar, multiecho pulse sequences of the brain and surrounding structures were obtained without and with intravenous contrast. CONTRAST:  49mL GADAVIST GADOBUTROL 1 MMOL/ML IV SOLN COMPARISON:  None. FINDINGS: Brain: There is a peripherally and heterogeneously contrast-enhancing mass within the left parietal white matter extending along the corpus callosum splenium. Mass measures 4.8 x 3.3 x 3.0 cm. There is moderate surrounding hyperintense T2-weighted signal a crosses into the right hemisphere. Rightward midline shift measures 3 mm. There is petechial hemorrhage within the mass. Vascular: Normal flow voids. Skull and upper cervical spine: Normal marrow signal. Sinuses/Orbits: Negative. Other: None. IMPRESSION: 1. Heterogeneously contrast-enhancing mass within the left parietal white matter extending along the corpus callosum splenium, most consistent with high-grade glioma. 2. Moderate surrounding hyperintense T2-weighted signal, likely a combination of edema and nonenhancing tumor. 3. 3 mm of rightward midline shift. 4. Intratumoral petechial hemorrhage. Electronically Signed   By: Ulyses Jarred M.D.   On: 11/23/2019 00:09   [place] Intraoperative  Result Date: 11/26/2019 CLINICAL DATA:  Ultrasound was provided for use by the ordering physician, and a technical charge was applied by the performing facility.  No radiologist interpretation/professional services rendered.   CT ABDOMEN  PELVIS W CONTRAST  Result Date: 11/23/2019 CLINICAL DATA:  69 y.o. female was brought to the ED due to confusion and gait imbalance. As per patient daughter, patient was having intermittent confusion. Patient denies chest pain, shortness of breath, nausea, vomiting or diarrhea. Mass seen on CT Head and Mri. EXAM: CT CHEST, ABDOMEN, AND PELVIS WITH CONTRAST TECHNIQUE: Multidetector CT imaging of the chest, abdomen and pelvis was performed following the standard protocol during bolus administration of intravenous contrast. CONTRAST:  160mL OMNIPAQUE IOHEXOL 300 MG/ML  SOLN COMPARISON:  Head CT and brain MRI from 11/22/2019 FINDINGS: CT CHEST FINDINGS Cardiovascular: Heart is normal in size and configuration. No pericardial effusion. No coronary artery calcifications. Great vessels are normal in caliber. No aortic atherosclerosis. Mediastinum/Nodes: No enlarged mediastinal, hilar, or axillary lymph nodes. Thyroid gland, trachea, and esophagus demonstrate no significant findings. Lungs/Pleura: Minor scarring at the base of the left upper lobe lingula. Minimal linear dependent lower lobe opacities consistent with atelectasis. Lungs otherwise clear with no masses or nodules. No pleural effusion or pneumothorax. Musculoskeletal: No fracture or acute finding. No osteoblastic or osteolytic lesions. CT ABDOMEN PELVIS FINDINGS Hepatobiliary: Liver normal in size and overall attenuation. Small low-density lesion between the anterior segment the right lobe and medial segment of the left, consistent with a cyst. Small amount of focal fat adjacent to the falciform ligament. No other abnormalities. Normal gallbladder. No bile duct dilation. Pancreas: Unremarkable. No pancreatic ductal dilatation or surrounding inflammatory changes. Spleen: Normal in size without focal abnormality. Adrenals/Urinary Tract: No adrenal masses. Kidneys normal in size, orientation and position with symmetric enhancement and excretion. Bilateral  low-attenuation renal masses consistent with cysts, largest from the lower pole of the left kidney, 4.5 cm. No renal stones. No hydronephrosis. Normal ureters. Normal bladder. Stomach/Bowel: Small hiatal hernia. Stomach is otherwise within normal limits. Appendix appears normal. No evidence of bowel wall thickening, distention, or inflammatory changes. Vascular/Lymphatic: No significant vascular abnormality. No enlarged lymph nodes. Reproductive: Uterus and bilateral adnexa are unremarkable. Other: No abdominal wall hernia or abnormality. No abdominopelvic ascites. Musculoskeletal: No fracture or acute finding. No osteoblastic or osteolytic lesions. IMPRESSION:  1. No acute findings within the chest, abdomen or pelvis. 2. No evidence of a primary malignancy within the chest, abdomen or pelvis. 3. Small low-density liver lesion consistent with a cyst. Bilateral renal cysts. Electronically Signed   By: Lajean Manes M.D.   On: 11/23/2019 14:38    Labs:  Basic Metabolic Panel: Recent Labs  Lab 12/03/19 1616 12/08/19 0647  NA 138 140  K 3.8 4.5  CL 101 104  CO2 26 28  GLUCOSE 145* 100*  BUN 25* 17  CREATININE 0.81 0.75  CALCIUM 8.9 8.5*    CBC: Recent Labs  Lab 12/03/19 1616 12/08/19 1033  WBC 23.8* 10.2  NEUTROABS 21.5* 8.0*  HGB 15.1* 13.3  HCT 44.2 40.4  MCV 83.6 86.5  PLT 383 267    CBG: Recent Labs  Lab 12/07/19 1213 12/07/19 1641 12/07/19 2340 12/08/19 0634 12/08/19 1648  GLUCAP 93 124* 106* 93 95   Family history.  Father with dementia and mother with dementia.  Mother with hypertension Sister with breast cancer.  Brother with diabetes.  Denies any colon cancer esophageal cancer or rectal cancer  Brief HPI:   Wanda Ross is a 69 y.o. right-handed female with history of hyperlipidemia and hypertension.  Lives alone 1 level home 2 steps to entry.  Independent prior to admission.  She does have a daughter and 2 sons in the area with good support.  Presented 11/22/2019  with altered mental status and gait abnormality.  Admission chemistries with potassium 2.8, glucose 155, urinalysis negative nitrite.  CT/MRI showed contrast-enhancing mass within the left parietal white matter extending along the corpus callosum splenium most consistent with high-grade glioma.  Moderate surrounding hyperintense T2 weighted signal likely combination of edema and nonenhancing tumor with a 3 mm rightward midline shift.  CT of the chest and abdomen pelvis showed no acute findings.  Patient underwent left craniotomy for resection parietal and splenial mass 11/26/2019 per Dr. Duffy Rhody.  Decadron protocol as indicated.  Maintained on Keppra for seizure prophylaxis.  Cleared for Lovenox for DVT prophylaxis 11/27/2019.  Tolerating regular diet.  Patient was admitted for a comprehensive rehab program.   Hospital Course: DANAHI REDDISH was admitted to rehab 12/02/2019 for inpatient therapies to consist of PT, ST and OT at least three hours five days a week. Past admission physiatrist, therapy team and rehab RN have worked together to provide customized collaborative inpatient rehab.  Pertaining to patient's brain mass with surgical past Solazine glioblastoma.  Patient had undergone left craniotomy resection of tumor and would follow-up neurosurgery as well as neuro-oncology and radiation oncology.  She was slowly tapered off of Decadron.  Remained on Lovenox for DVT prophylaxis throughout her hospital course.  Blood pressure controlled on Norvasc and Catapres but follow-up outpatient.  Pravachol ongoing for hyperlipidemia.  She did have some steroid-induced hyperglycemia due to Decadron and monitored.   Blood pressures were monitored on TID basis and controlled   Rehab course: During patient's stay in rehab weekly team conferences were held to monitor patient's progress, set goals and discuss barriers to discharge. At admission, patient required minimal assist 180 feet 1 person hand-held  assist, minimal guard sit to stand.  Set up for eating supervision upper body bathing minimal assist lower body bathing minimal assist upper body dressing minimal assist lower body dressing  Physical exam.  Blood pressure 126/76 pulse 66 temperature 98 respirations 18 oxygen saturation 98% room air General.  Alert and oriented no apparent distress HEENT Head.  Craniotomy site clean and dry Eyes.  Pupils round and reactive to light no discharge.nystagmus Neck.  Supple nontender no JVD without thyromegaly Cardiac regular rate rhythm without any extra sounds or murmur heard Abdomen.  Soft nontender positive bowel sounds without rebound Respiratory effort normal no respiratory distress without wheeze Extremities.  No clubbing cyanosis or edema Neuro.  Patient alert no acute distress has a right visual field cut.  Follows commands but had some difficulty with multiple step commands.  Impaired attention and short memory.  Marlana Salvage  has had improvement in activity tolerance, balance, postural control as well as ability to compensate for deficits. Marlana Salvage has had improvement in functional use RUE/LUE  and RLE/LLE as well as improvement in awareness.  Patient ambulates multiple trials without assistive device contact-guard 200 feet needing some visual cues for attention to the right.  Negotiated curb height steps contact-guard assist.  No loss of balance.  Perform dynamic high-level balance activities including stop starts looking side to side during ambulation obstacle course.  Patient ambulates throughout the room contact-guard assist to retrieve items for ADLs.  Completed showering standby assist.  Completes dressing standby assist.  She did require some assistance for semicomplex designs  parts top to bottom processing difficulty seeing pictures due to field cut.  All issues in regards to safety concerns discussed with family.  Full family teaching was completed and discharged to home       Disposition:  Discharge to home    Diet: Regular  Special Instructions: No driving smoking or alcohol  Patient to follow-up with neurosurgery as well as medical oncology in regards to brain mass and plan of care  Medications at discharge 1.  Tylenol as needed 2.  Norvasc 5 mg p.o. daily 3.  Clonidine 0.3 mg nightly 4.  Colace 100 mg p.o. twice daily 5.  Cosopt ophthalmic solution 1 drop right eye 2 times daily 6.  Hydrocodone 1 tablet every 4 hours as needed pain 7.  Protonix 40 mg p.o. daily 8.  Pravachol 10 mg p.o. daily 9.  Vitamin B12 2500 mcg p.o. daily.  30-35 minutes were spent completing discharge summary and discharge planning  Discharge Instructions     Ambulatory referral to Physical Medicine Rehab   Complete by: As directed    Moderate complexity follow up 1-2 weeks glioma        Follow-up Information     Jamse Arn, MD Follow up.   Specialty: Physical Medicine and Rehabilitation Why: Office to call for appointment Contact information: [address] [place] [address] [phone]         [name], MD Follow up.   Specialty: Neurosurgery Why: Call for appointment Contact information: [place] [address] [phone]         Ventura Sellers, MD Follow up.   Specialties: Psychiatry, Neurology, Oncology Why: Call for appointment Contact information: [place] [address] [phone]                 Signed: Cathlyn Parsons 12/10/2019, 4:57 AM Patient was seen, face-face, and physical exam performed by me on day of discharge, greater than 30 minutes of total time spent.. Please see progress note from day of discharge as well.  Delice Lesch, MD, ABPMR

## 2019-12-08 NOTE — Progress Notes (Signed)
[facility] PHYSICAL MEDICINE & REHABILITATION PROGRESS NOTE  Subjective/Complaints: Pt sitting up in recliner. Had a busy morning with therapy! No complaints. Sleeping well  ROS: Patient denies fever, rash, sore throat, blurred vision, nausea, vomiting, diarrhea, cough, shortness of breath or chest pain, joint or back pain, headache, or mood change.    Objective: Vital Signs: Blood pressure (!) 104/57, pulse (!) 56, temperature 97.8 F (36.6 C), resp. rate 17, height 5\' 8"  (1.727 m), weight 73.8 kg, SpO2 97 %. No results found. Recent Labs    12/08/19 1033  WBC 10.2  HGB 13.3  HCT 40.4  PLT 267   Recent Labs    12/08/19 0647  NA 140  K 4.5  CL 104  CO2 28  GLUCOSE 100*  BUN 17  CREATININE 0.75  CALCIUM 8.5*    Physical Exam: BP (!) 104/57 (BP Location: Left Arm)   Pulse (!) 56   Temp 97.8 F (36.6 C)   Resp 17   Ht 5\' 8"  (1.727 m)   Wt 73.8 kg   SpO2 97%   BMI 24.74 kg/m   Constitutional: No distress . Vital signs reviewed. HEENT: EOMI, oral membranes moist Neck: supple Cardiovascular: RRR without murmur. No JVD    Respiratory/Chest: CTA Bilaterally without wheezes or rales. Normal effort    GI/Abdomen: BS +, non-tender, non-distended Ext: no clubbing, cyanosis, or edema Psych: pleasant and cooperative Skin: Warm and dry.  Left crani incision CDI with staples Psych: Normal mood.  Normal behavior. Musc: No edema in extremities.  No tenderness in extremities. Neuro: Alert. Good insight and awareness. Oriented to place, person, date. Follows simple commands RUE/RLE: 4+-5/5 proximal to distal, stable Visual fields intact  Assessment/Plan: 1. Functional deficits secondary to glioma status post resection which require 3+ hours per day of interdisciplinary therapy in a comprehensive inpatient rehab setting.  Physiatrist is providing close team supervision and 24 hour management of active medical problems listed below.  Physiatrist and rehab team continue to  assess barriers to discharge/monitor patient progress toward functional and medical goals  Care Tool:  Bathing  Bathing activity did not occur:  (N/A) Body parts bathed by patient: Right arm, Face, Left arm, Chest, Abdomen, Front perineal area, Buttocks, Right upper leg, Left upper leg, Right lower leg, Left lower leg         Bathing assist Assist Level: Supervision/Verbal cueing     Upper Body Dressing/Undressing Upper body dressing Upper body dressing/undressing activity did not occur (including orthotics): N/A What is the patient wearing?: Pull over shirt, Bra    Upper body assist Assist Level: Set up assist    Lower Body Dressing/Undressing Lower body dressing    Lower body dressing activity did not occur: N/A What is the patient wearing?: Underwear/pull up, Pants     Lower body assist Assist for lower body dressing: Set up assist     Toileting Toileting    Toileting assist Assist for toileting: Contact Guard/Touching assist     Transfers Chair/bed transfer  Transfers assist     Chair/bed transfer assist level: Contact Guard/Touching assist Chair/bed transfer assistive device: Armrests   Locomotion Ambulation   Ambulation assist      Assist level: Contact Guard/Touching assist Assistive device: No Device Max distance: 200   Walk 10 feet activity   Assist     Assist level: Contact Guard/Touching assist Assistive device: No Device   Walk 50 feet activity   Assist    Assist level: Contact Guard/Touching assist Assistive device:  No Device    Walk 150 feet activity   Assist    Assist level: Contact Guard/Touching assist Assistive device: No Device    Walk 10 feet on uneven surface  activity   Assist     Assist level: Minimal Assistance - Patient > 75% Assistive device: Other (comment) (no device)   Wheelchair     Assist Will patient use wheelchair at discharge?: No Type of Wheelchair:  (functional  ambulator) Wheelchair activity did not occur:  (functional ambulator)         Wheelchair 50 feet with 2 turns activity    Assist    Wheelchair 50 feet with 2 turns activity did not occur:  (functional ambulator)       Wheelchair 150 feet activity     Assist Wheelchair 150 feet activity did not occur:  (functional ambulator)          Medical Problem List and Plan: 1.  Altered mental status with gait abnormality secondary to glioma.  Status post left craniotomy resection of tumor 11/26/2019.    Decadron taper through 7/13.  Continue CIR  Functional field cut noted on discussion with therapies, however no field cut noted on testing 2.  Antithrombotics: -DVT/anticoagulation: Lovenox initiated 11/27/2019             -antiplatelet therapy: N/A 3. Pain Management: Hydrocodone 4. Mood: Provide motional support             -antipsychotic agents: N/A 5. Neuropsych: This patient is ?fully capable of making decisions on her own behalf. 6. Skin/Wound Care: Routine skin checks 7. Fluids/Electrolytes/Nutrition: Routine in and outs 8.  Seizure prophylaxis.  Keppra 500 mg twice daily x1 week, discussed with pharmacy, d/ced 9.  Hypertension.  Clonidine 0.3 mg nightly, Norvasc 5 mg daily.    Controlled on 7/12  Monitor with increased mobility 10.  Hyperlipidemia.  Pravachol 11.  Steroid-induced hyperglycemia on prediabetes.  SSI.  Controlled on 7/12   -With steroids ending, no longer needs cbg checks. 12. Glioblastoma: .     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  13.  Leukocytosis-likely steroid-induced  WBC 23.8 on 7/7 --> 10.2 7/12  Remains afebrile 14. Transaminitis  ALT elevated on 7/7--> all LFT's normal 7/12  Cont to monitor 15. Moderate Hypoalbuminemia  Supplement initiated  LOS: 6 days A FACE TO FACE EVALUATION WAS PERFORMED  Meredith Staggers 12/08/2019, 11:43 AM

## 2019-12-09 ENCOUNTER — Inpatient Hospital Stay (HOSPITAL_COMMUNITY): Payer: Medicare PPO | Admitting: Occupational Therapy

## 2019-12-09 ENCOUNTER — Inpatient Hospital Stay (HOSPITAL_COMMUNITY): Payer: Medicare PPO | Admitting: Speech Pathology

## 2019-12-09 ENCOUNTER — Inpatient Hospital Stay (HOSPITAL_COMMUNITY): Payer: Medicare PPO | Admitting: Physical Therapy

## 2019-12-09 DIAGNOSIS — C719 Malignant neoplasm of brain, unspecified: Secondary | ICD-10-CM

## 2019-12-09 DIAGNOSIS — E785 Hyperlipidemia, unspecified: Secondary | ICD-10-CM

## 2019-12-09 MED ORDER — AMLODIPINE BESYLATE 5 MG PO TABS: 5.0000 mg | ORAL_TABLET | Freq: Every day | ORAL | 1 refills | Status: DC

## 2019-12-09 MED ORDER — PRAVASTATIN SODIUM 10 MG PO TABS: ORAL_TABLET | ORAL | 1 refills | Status: DC

## 2019-12-09 MED ORDER — HYDROCODONE-ACETAMINOPHEN 5-325 MG PO TABS: 1.0000 | ORAL_TABLET | ORAL | 0 refills | Status: DC | PRN

## 2019-12-09 MED ORDER — CALCIUM 1200 1200-1000 MG-UNIT PO CHEW: 1200.0000 mg | CHEWABLE_TABLET | Freq: Every day | ORAL | 1 refills | Status: AC

## 2019-12-09 MED ORDER — CLONIDINE HCL 0.3 MG PO TABS: ORAL_TABLET | ORAL | 0 refills | Status: DC

## 2019-12-09 MED ORDER — CYANOCOBALAMIN 2500 MCG PO TABS: 2500.0000 ug | ORAL_TABLET | Freq: Every day | ORAL | 0 refills | Status: AC

## 2019-12-09 MED ORDER — PANTOPRAZOLE SODIUM 40 MG PO TBEC: 40.0000 mg | DELAYED_RELEASE_TABLET | Freq: Every day | ORAL | 0 refills | Status: DC

## 2019-12-09 NOTE — Progress Notes (Signed)
Occupational Therapy Discharge Summary  Patient Details  Name: Wanda Ross MRN: [id number] Date of Birth: [birth year]  Today's Date: 12/09/2019 OT Individual Time: 2836-6294 OT Individual Time Calculation (min): 59 min    Patient has met 12 of 14 long term goals due to improved balance, ability to compensate for deficits, improved attention, improved awareness and improved coordination.  Patient to discharge at overall Supervision level.  Patient's care partner is independent to provide the necessary physical and cognitive assistance at discharge.    Reasons goals not met: Pt still requires CGA for tub shower and toilet transfers due to impaired dynamic standing balance and slight right visual field cut.    Recommendation:  Patient will benefit from ongoing skilled OT services in outpatient setting to continue to advance functional skills in the area of BADL and iADL.  Pt exhibits right visual field cut and impaired dynamic standing balance as well as decreased cognitive function including memory, problem solving, oranization, and decision making. Pt requires supervision to [place] for ADLs and IADLs.  Equipment: Bedside Commode  Reasons for discharge: treatment goals met and discharge from hospital   Skilled Intervention:  Pt sitting up in recliner agreeable to washing up in shower.  No c/o pain.  Pt gathered ADL items and completed functional mobility to shower bench with supervision.  Pt completed UB and LB dressing and bathing with supervision.  Pt completed oral hygiene and grooming sitting sinkside with setup. Pt then completed functional ambulation to gym to participate in gross motor standing balance challenge to toss/catch ball with BUE at trampoline with CGA to supervision. Pt completed functional mobility back to room andreturned to recliner with CGA. call bell in reach, seat alarm on.  Patient/family agrees with progress made and goals achieved: Yes  OT  Discharge Precautions/Restrictions  Precautions Precautions: Fall Precaution Comments: R inattention Restrictions Weight Bearing Restrictions: No   Pain Pain Assessment Pain Scale: 0-10 Pain Score: 0-No pain ADL ADL Eating: Independent Where Assessed-Eating: Chair Grooming: Setup Where Assessed-Grooming: Sitting at sink Upper Body Bathing: Supervision/safety Where Assessed-Upper Body Bathing: Shower Lower Body Bathing: Supervision/safety Where Assessed-Lower Body Bathing: Shower Upper Body Dressing: Setup Where Assessed-Upper Body Dressing: Chair Lower Body Dressing: Setup Where Assessed-Lower Body Dressing: Chair Toileting: Supervision/safety Where Assessed-Toileting: Glass blower/designer: Therapist, music Method: Counselling psychologist: Energy manager: Metallurgist Method: Optometrist: Civil engineer, contracting with back Social research officer, government: Environmental education officer Method: Heritage manager: Civil engineer, contracting with back Vision Baseline Vision/History: Wears glasses Wears Glasses: At all times Patient Visual Report: No change from baseline Eye Alignment: Within Functional Limits Ocular Range of Motion: Within Functional Limits Tracking/Visual Pursuits: Able to track stimulus in all quads without difficulty Convergence: Within functional limits Visual Fields: No apparent deficits Perception  Inattention/Neglect: Does not attend to right visual field Praxis Praxis: Intact Cognition Overall Cognitive Status: Impaired/Different from baseline Arousal/Alertness: Awake/alert Orientation Level: Oriented X4 Attention: Sustained;Divided;Alternating Sustained Attention Impairment: Verbal basic;Functional basic Alternating Attention: Appears intact Alternating Attention Impairment: Verbal basic;Functional basic Divided Attention: Appears intact Memory:  Impaired Memory Impairment: Decreased recall of new information Decreased Short Term Memory: Verbal basic Awareness: Impaired Awareness Impairment: Emergent impairment Problem Solving: Impaired Problem Solving Impairment: Verbal basic;Functional basic Executive Function: Organizing;Decision Making;Initiating;Self Correcting Reasoning: Appears intact Organizing: Impaired Organizing Impairment: Verbal basic;Functional basic Decision Making: Impaired Decision Making Impairment: Verbal basic;Functional basic Initiating: Impaired Initiating Impairment: Verbal basic;Functional basic Self Correcting: Impaired Self Correcting Impairment: Verbal basic;Functional  basic Safety/Judgment: Impaired Sensation Sensation Light Touch: Not tested Hot/Cold: Appears Intact Proprioception: Not tested Stereognosis: Appears Intact Coordination Gross Motor Movements are Fluid and Coordinated: No Fine Motor Movements are Fluid and Coordinated: Yes Coordination and Movement Description: mild ataxia of BLE during functional mobility, mild lack of coordination of RUE Motor  Motor Motor: Abnormal postural alignment and control Motor - Discharge Observations: R hemiparesis Mobility  Bed Mobility Bed Mobility: Rolling Right;Rolling Left;Supine to Sit;Sit to Supine Rolling Right: Independent Rolling Left: Independent Supine to Sit: Independent Sit to Supine: Independent Transfers Sit to Stand: Supervision/Verbal cueing Stand to Sit: Supervision/Verbal cueing  Trunk/Postural Assessment  Cervical Assessment Cervical Assessment: Within Functional Limits Thoracic Assessment Thoracic Assessment: Within Functional Limits Lumbar Assessment Lumbar Assessment: Within Functional Limits Postural Control Postural Control: Deficits on evaluation Protective Responses: slightly delayed  Balance Balance Balance Assessed: Yes Standardized Balance Assessment Standardized Balance Assessment: Berg Balance  Test Berg Balance Test Sit to Stand: Able to stand without using hands and stabilize independently Standing Unsupported: Able to stand safely 2 minutes Sitting with Back Unsupported but Feet Supported on Floor or Stool: Able to sit safely and securely 2 minutes Stand to Sit: Sits safely with minimal use of hands Transfers: Able to transfer safely, minor use of hands Standing Unsupported with Eyes Closed: Able to stand 10 seconds with supervision Standing Ubsupported with Feet Together: Able to place feet together independently and stand for 1 minute with supervision From Standing, Reach Forward with Outstretched Arm: Can reach confidently >25 cm (10") From Standing Position, Pick up Object from Floor: Able to pick up shoe, needs supervision From Standing Position, Turn to Look Behind Over each Shoulder: Looks behind from both sides and weight shifts well Turn 360 Degrees: Able to turn 360 degrees safely but slowly Standing Unsupported, Alternately Place Feet on Step/Stool: Able to complete 4 steps without aid or supervision (completes 8 steps with close supervision) Standing Unsupported, One Foot in Front: Able to take small step independently and hold 30 seconds Standing on One Leg: Tries to lift leg/unable to hold 3 seconds but remains standing independently (pt elects to stand on RLE with LLE in air then states "I probably could've done better on the other leg") Total Score: 44 Static Sitting Balance Static Sitting - Level of Assistance: 7: Independent Dynamic Sitting Balance Dynamic Sitting - Level of Assistance: 5: Stand by assistance Static Standing Balance Static Standing - Level of Assistance: 5: Stand by assistance Dynamic Standing Balance Dynamic Standing - Level of Assistance: 5: Stand by assistance Extremity/Trunk Assessment RUE Assessment RUE Assessment: Within Functional Limits LUE Assessment LUE Assessment: Within Functional Limits   Yazmyn Valbuena L Ashleynicole Mcclees 12/09/2019, 12:30  PM

## 2019-12-09 NOTE — Progress Notes (Signed)
Physical Therapy Discharge Summary  Patient Details  Name: Wanda Ross MRN: [id number] Date of Birth: 1951-03-03  Today's Date: 12/09/2019 PT Individual Time: 8366-2947 PT Individual Time Calculation (min): 54 min    Patient has met 10 of 12 long term goals due to improved activity tolerance, improved balance, improved postural control, increased strength and ability to compensate for deficits.  Patient to discharge at an ambulatory level Supervision without AD for gait, requires UE support & min assist to negotiate 2 steps without rails. Patient's family has been present during pt's LOS for family education, but this therapist did not meet/train any caregivers, please refer to previous PT note.  Reasons goals not met: pt did not meet stair goal as she requires UE support to safely descend stairs without rails, did not meet vision goal as pt requires max cuing to attend to R side   Recommendation:  Patient will benefit from ongoing skilled PT services in outpatient setting to continue to advance safe functional mobility, address ongoing impairments in balance, R attention, stair negotiation without UE support, and minimize fall risk.  Equipment: No equipment provided  Reasons for discharge: discharge from hospital  Patient/family agrees with progress made and goals achieved: Yes   Skilled PT Treatment: Pt received in recliner & agreeable to tx. Sit<>stand transfers with supervision throughout session, gait throughout unit without AD & close supervision with pt continuing to demonstrate R inattention to environment, even turning left when instructed to turn R. Gait over uneven surface with close supervision, car transfer at sedan simulated height with max cuing to sit & safely lower herself down by reaching back for car seat then place BLE in/out of car. Floor transfer with supervision and instructions on how to complete transfer. Stair negotiation (3" + 6") with LUE support on rail &  supervision with pt self selecting gait pattern. Stair negotiation x 4 steps (6") without rails with therapist providing demo for step to compensatory pattern with pt able to ascend steps with close supervision but has to hold therapist's hand with her LUE to safely descend; recommended to pt to have someone assist her with stair negotiation to access her home.  Bed mobility on mat table with independence. Berg balance test completed with pt scoring 44/56; educated pt on interpretation of score & current fall risk (recommendation of 24 hr supervision, which pt reports she has). Patient demonstrates increased fall risk as noted by score of 44/56 on Berg Balance Scale.  (<36= high risk for falls, close to 100%; 37-45 significant >80%; 46-51 moderate >50%; 52-55 lower >25%). Gait in hallway with pt engaging in locating beads placed on right side of hallway with pt missing every bead during first trial, requiring max cuing then pt able to locate them with less assistance. At end of session pt left in recliner with BLE elevated & chair alarm donned, call bell & all needs in reach.    PT Discharge Precautions/Restrictions Precautions Precautions: Fall Precaution Comments: R inattention Restrictions Weight Bearing Restrictions: No   Pain Pt denies c/o pain.  Vision/Perception  Pt wears glasses at all times at baseline. No changes from baseline.  Perception Inattention/Neglect: Does not attend to right visual field Praxis Praxis: Intact   Cognition Overall Cognitive Status: Impaired/Different from baseline Arousal/Alertness: Awake/alert Orientation Level: Oriented X4 Memory: Impaired Memory Impairment: Decreased recall of new information Awareness: Impaired Awareness Impairment: Emergent impairment Problem Solving: Impaired Safety/Judgment: Impaired   Sensation Sensation Light Touch: Not tested Proprioception: Not tested Coordination Gross Motor  Movements are Fluid and Coordinated:  No Fine Motor Movements are Fluid and Coordinated: Yes Coordination and Movement Description: mild ataxia of BLE during functional mobility, mild lack of coordination of RUE   Motor  Motor Motor: Abnormal postural alignment and control Motor - Discharge Observations: R hemiparesis   Mobility Bed Mobility Bed Mobility: Rolling Right;Rolling Left;Supine to Sit;Sit to Supine Rolling Right: Independent Rolling Left: Independent Supine to Sit: Independent Sit to Supine: Independent Transfers Transfers: Sit to Stand;Stand to Sit Sit to Stand: Supervision/Verbal cueing Stand to Sit: Supervision/Verbal cueing Stand Pivot Transfers: Contact Guard/Touching assist Stand Pivot Transfer Details: Tactile cues for weight shifting Transfer (Assistive device): None   Locomotion  Gait Ambulation: Yes Gait Assistance: Supervision/Verbal cueing Gait Distance (Feet):  (>500 ft) Assistive device: None Gait Assistance Details: Verbal cues for precautions/safety Gait Assistance Details: cuing to attend to R side of environment Gait Gait: Yes Gait Pattern: Impaired Gait Pattern:  (minimally decreased dorsiflexion RLE during swing phase, cuing for increased heel strike, slightly decreased R hip flexion during swing phase) High Level Ambulation High Level Ambulation: Backwards walking Backwards Walking: min assist without AD Stairs / Additional Locomotion Stairs: Yes Stairs Assistance: Supervision/Verbal cueing Stair Management Technique: Two rails Number of Stairs: 12 Height of Stairs:  (6" + 3") 4 steps (6") without rails with LUE support on therapist with min assist Ramp: Supervision/Verbal cueing (no AD) Wheelchair Mobility Wheelchair Mobility: No   Trunk/Postural Assessment  Thoracic Assessment Thoracic Assessment:  (rounded shoulders) Postural Control Postural Control: Deficits on evaluation Protective Responses: slightly delayed   Balance Balance Balance Assessed:  Yes Standardized Balance Assessment Standardized Balance Assessment: Berg Balance Test Berg Balance Test Sit to Stand: Able to stand without using hands and stabilize independently Standing Unsupported: Able to stand safely 2 minutes Sitting with Back Unsupported but Feet Supported on Floor or Stool: Able to sit safely and securely 2 minutes Stand to Sit: Sits safely with minimal use of hands Transfers: Able to transfer safely, minor use of hands Standing Unsupported with Eyes Closed: Able to stand 10 seconds with supervision Standing Ubsupported with Feet Together: Able to place feet together independently and stand for 1 minute with supervision From Standing, Reach Forward with Outstretched Arm: Can reach confidently >25 cm (10") From Standing Position, Pick up Object from Floor: Able to pick up shoe, needs supervision From Standing Position, Turn to Look Behind Over each Shoulder: Looks behind from both sides and weight shifts well Turn 360 Degrees: Able to turn 360 degrees safely but slowly Standing Unsupported, Alternately Place Feet on Step/Stool: Able to complete 4 steps without aid or supervision (completes 8 steps with close supervision) Standing Unsupported, One Foot in Front: Able to take small step independently and hold 30 seconds Standing on One Leg: Tries to lift leg/unable to hold 3 seconds but remains standing independently (pt elects to stand on RLE with LLE in air then states "I probably could've done better on the other leg") Total Score: 44  Extremity Assessment  RUE Assessment RUE Assessment: Within Functional Limits LUE Assessment LUE Assessment: Within Functional Limits RLE Assessment RLE Assessment: Within Functional Limits General Strength Comments: ankle DF 4+/5, quad 5/5, seated hip ABD 4+/5, hip flexor 4-/5 LLE Assessment LLE Assessment: Within Functional Limits    Waunita Schooner 12/09/2019, 12:21 PM

## 2019-12-09 NOTE — Progress Notes (Signed)
Patient ID: Wanda Ross, female   DOB: 1950/08/09, 70 y.o.   MRN: [id number]    Patient will be a evening discharge (4PM-5PM). PA will call daughter to review discharge information prior to discharge on tomorrow

## 2019-12-09 NOTE — Plan of Care (Signed)
  Problem: RH Vision Goal: RH LTG Vision (Specify) Outcome: Progressing   Problem: Consults Goal: RH GENERAL PATIENT EDUCATION Description: See Patient Education module for education specifics. Outcome: Progressing Goal: Skin Care Protocol Initiated - if Braden Score 18 or less Description: If consults are not indicated, leave blank or document N/A Outcome: Progressing Goal: Nutrition Consult-if indicated Outcome: Progressing Goal: Diabetes Guidelines if Diabetic/Glucose > 140 Description: If diabetic or lab glucose is > 140 mg/dl - Initiate Diabetes/Hyperglycemia Guidelines & Document Interventions  Outcome: Progressing   Problem: RH BOWEL ELIMINATION Goal: RH STG MANAGE BOWEL WITH ASSISTANCE Description: STG Manage Bowel with mod I Assistance. Outcome: Progressing Goal: RH STG MANAGE BOWEL W/MEDICATION W/ASSISTANCE Description: STG Manage Bowel with Medication with mod I Assistance. Outcome: Progressing   Problem: RH BLADDER ELIMINATION Goal: RH STG MANAGE BLADDER WITH ASSISTANCE Description: STG Manage Bladder With mod I Assistance Outcome: Progressing Goal: RH STG MANAGE BLADDER WITH MEDICATION WITH ASSISTANCE Description: STG Manage Bladder With Medication With mod I Assistance. Outcome: Progressing   Problem: RH SKIN INTEGRITY Goal: RH STG SKIN FREE OF INFECTION/BREAKDOWN Description: Patients skin will remain free from further infection or breakdown with mod I assist. Outcome: Progressing Goal: RH STG MAINTAIN SKIN INTEGRITY WITH ASSISTANCE Description: STG Maintain Skin Integrity With mod I Assistance. Outcome: Progressing Goal: RH STG ABLE TO PERFORM INCISION/WOUND CARE W/ASSISTANCE Description: STG Able To Perform Incision/Wound Care With min Assistance. Outcome: Progressing   Problem: RH SAFETY Goal: RH STG ADHERE TO SAFETY PRECAUTIONS W/ASSISTANCE/DEVICE Description: STG Adhere to Safety Precautions With min Assistance/Device. Outcome: Progressing Goal: RH  STG DECREASED RISK OF FALL WITH ASSISTANCE Description: STG Decreased Risk of Fall With mod I Assistance. Outcome: Progressing   Problem: RH PAIN MANAGEMENT Goal: RH STG PAIN MANAGED AT OR BELOW PT'S PAIN GOAL Description: < 3 Outcome: Progressing

## 2019-12-09 NOTE — Progress Notes (Signed)
Speech Language Pathology Discharge Summary  Patient Details  Name: Wanda Ross MRN: [id number] Date of Birth: 1950/11/28  Today's Date: 12/09/2019 SLP Individual Time: 0830-0928 SLP Individual Time Calculation (min): 58 min   Skilled Therapeutic Interventions:  Pt was seen for skilled ST targeting cognitive goals. SLP facilitated session with administration of portions of the [facility] cognitive evaluation as a post-test. Pt demonstrated improved performance on delayed recall task in comparison to initial evaluation; she recalled 5/5 words Mod I for use of repetition strategy. She also used strategies to increase auditory comprehension throughout evaluation, primarily by repeating back instructions to SLP to verify messages. Paragraph recall was reduced, suspect due to combination of residual memory and comprehension deficits, as well as use of more complex language than delayed recall task. Pt also required Min A verbal and visual cues and use of written aid to problem solve functional basic calculations (adding and subtracting). SLP further facilitated session with Min A verbal and visual cues for problem solving a basic PEG board design (recreating from photograph), and increased Mod A for problem solving and error awareness with a design of increased complexity. Pt left laying in bed with alarm set and needs within reach. Continue per current plan of care.     Patient has met 4 of 4 long term goals.  Patient to discharge at overall Min;Mod;Supervision level.  Reasons goals not met: n/a   Clinical Impression/Discharge Summary:   Pt made functional gains and met 4 out of 4 long term goals this admission. Pt currently requires Min-Mod assist for basic cognitive tasks due to impairments impacting her auditory comprehension, problem solving, emergent awareness, and short term memory. As a results, she will require 24/7 supervision at discharge for greatest safety. Pt has demonstrated improved  ability to detect functional errors and use compensatory memory strategies, as well as carryover of strategies to increase cognitive reorganization. However, given deficits still present, recommend pt continue to receive skilled ST services upon discharge. Pt and family education is complete at this time.    Care Partner:  Caregiver Able to Provide Assistance: Yes  Type of Caregiver Assistance: Cognitive  Recommendation:  24 hour supervision/assistance;Outpatient SLP;Home Health SLP  Rationale for SLP Follow Up: Reduce caregiver burden;Maximize cognitive function and independence   Equipment: none   Reasons for discharge: Discharged from hospital   Patient/Family Agrees with Progress Made and Goals Achieved: Yes    Arbutus Leas 12/09/2019, 12:14 PM

## 2019-12-09 NOTE — Progress Notes (Addendum)
[place] PHYSICAL MEDICINE & REHABILITATION PROGRESS NOTE  Subjective/Complaints: Patient seen sitting up in bed this morning.  She states she slept well overnight.  She is looking forward to discharge tomorrow.  ROS: Denies CP, SOB, N/V/D  Objective: Vital Signs: Blood pressure 107/67, pulse (!) 58, temperature 98.5 F (36.9 C), temperature source Oral, resp. rate 16, height 5\' 8"  (1.727 m), weight 73.8 kg, SpO2 99 %. No results found. Recent Labs    12/08/19 1033  WBC 10.2  HGB 13.3  HCT 40.4  PLT 267   Recent Labs    12/08/19 0647  NA 140  K 4.5  CL 104  CO2 28  GLUCOSE 100*  BUN 17  CREATININE 0.75  CALCIUM 8.5*    Physical Exam: BP 107/67 (BP Location: Left Arm)   Pulse (!) 58   Temp 98.5 F (36.9 C) (Oral)   Resp 16   Ht 5\' 8"  (1.727 m)   Wt 73.8 kg   SpO2 99%   BMI 24.74 kg/m   Constitutional: No distress . Vital signs reviewed. HENT: Normocephalic.  Atraumatic. Eyes: EOMI. No discharge. Cardiovascular: No JVD.  RRR. Respiratory: Normal effort.  No stridor.  Bilaterally clear to auscultation. GI: Non-distended. Skin: Warm and dry.  Intact. Psych: Normal mood.  Normal behavior. Musc: No edema in extremities.  No tenderness in extremities. Neuro: Alert Motor: 4+-5/5 throughout No dysmetria noted Visual fields intact  Assessment/Plan: 1. Functional deficits secondary to glioma status post resection which require 3+ hours per day of interdisciplinary therapy in a comprehensive inpatient rehab setting.  Physiatrist is providing close team supervision and 24 hour management of active medical problems listed below.  Physiatrist and rehab team continue to assess barriers to discharge/monitor patient progress toward functional and medical goals  Care Tool:  Bathing  Bathing activity did not occur:  (N/A) Body parts bathed by patient: Right arm, Face, Left arm, Chest, Abdomen, Front perineal area, Buttocks, Right upper leg, Left upper leg, Right  lower leg, Left lower leg         Bathing assist Assist Level: Supervision/Verbal cueing     Upper Body Dressing/Undressing Upper body dressing Upper body dressing/undressing activity did not occur (including orthotics): N/A What is the patient wearing?: Pull over shirt, Bra    Upper body assist Assist Level: Set up assist    Lower Body Dressing/Undressing Lower body dressing    Lower body dressing activity did not occur: N/A What is the patient wearing?: Underwear/pull up, Pants     Lower body assist Assist for lower body dressing: Set up assist     Toileting Toileting    Toileting assist Assist for toileting: Contact Guard/Touching assist     Transfers Chair/bed transfer  Transfers assist     Chair/bed transfer assist level: Contact Guard/Touching assist Chair/bed transfer assistive device: Armrests   Locomotion Ambulation   Ambulation assist      Assist level: Contact Guard/Touching assist Assistive device: No Device Max distance: >1000 ft   Walk 10 feet activity   Assist     Assist level: Contact Guard/Touching assist Assistive device: No Device   Walk 50 feet activity   Assist    Assist level: Contact Guard/Touching assist Assistive device: No Device    Walk 150 feet activity   Assist    Assist level: Contact Guard/Touching assist Assistive device: No Device    Walk 10 feet on uneven surface  activity   Assist     Assist level: Contact Guard/Touching assist  Assistive device:  (none)   Wheelchair     Assist Will patient use wheelchair at discharge?: No Type of Wheelchair:  (functional ambulator) Wheelchair activity did not occur:  (functional ambulator)         Wheelchair 50 feet with 2 turns activity    Assist    Wheelchair 50 feet with 2 turns activity did not occur:  (functional ambulator)       Wheelchair 150 feet activity     Assist Wheelchair 150 feet activity did not occur:  (functional  ambulator)          Medical Problem List and Plan: 1.  Altered mental status with gait abnormality secondary to glioma.  Status post left craniotomy resection of tumor 11/26/2019.    Decadron taper through 7/13.  Continue CIR  Functional field cut noted on discussion with therapies, however no field cut noted on testing 2.  Antithrombotics: -DVT/anticoagulation: Lovenox initiated 11/27/2019             -antiplatelet therapy: N/A 3. Pain Management: Hydrocodone 4. Mood: Provide motional support             -antipsychotic agents: N/A 5. Neuropsych: This patient is ?fully capable of making decisions on her own behalf. 6. Skin/Wound Care: Routine skin checks 7. Fluids/Electrolytes/Nutrition: Routine in and outs 8.  Seizure prophylaxis.  Keppra 500 mg twice daily x1 week, discussed with pharmacy, d/ced 9.  Hypertension.  Clonidine 0.3 mg nightly, Norvasc 5 mg daily.    Relatively controlled on 7/13  Monitor with increased mobility 10.  Hyperlipidemia.  Continue Pravachol 11.  Steroid-induced hyperglycemia on prediabetes.  SSI.  Controlled on 7/12 12. Glioblastoma: .     Pathology positive for Grade 4 glioblastoma  Plan for radiation and chemotherapy- neuro-onc and rad onc are following.  13.  Leukocytosis: Resolved  WBC 10.2 on 7/12  Remains afebrile 14. Transaminitis: Resolved  LFTs within normal limits on 7/12  Cont to monitor 15. Moderate Hypoalbuminemia  Supplement initiated, persistent-we will need to monitor with initiation of chemo  LOS: 7 days A FACE TO FACE EVALUATION WAS PERFORMED  Daune Divirgilio [name] 12/09/2019, 8:39 AM

## 2019-12-10 NOTE — Progress Notes (Signed)
[place] PHYSICAL MEDICINE & REHABILITATION PROGRESS NOTE  Subjective/Complaints: Patient seen sitting up in bed this AM.  She states she slept well overnight. She is appreciative of her care and ready for discharge.   ROS: Denies CP, SOB, N/V/D  Objective: Vital Signs: Blood pressure 116/67, pulse 62, temperature 98.2 F (36.8 C), temperature source Oral, resp. rate 18, height 5\' 8"  (1.727 m), weight 73.8 kg, SpO2 98 %. No results found. Recent Labs    12/08/19 1033  WBC 10.2  HGB 13.3  HCT 40.4  PLT 267   Recent Labs    12/08/19 0647  NA 140  K 4.5  CL 104  CO2 28  GLUCOSE 100*  BUN 17  CREATININE 0.75  CALCIUM 8.5*    Physical Exam: BP 116/67 (BP Location: Left Arm)   Pulse 62   Temp 98.2 F (36.8 C) (Oral)   Resp 18   Ht 5\' 8"  (1.727 m)   Wt 73.8 kg   SpO2 98%   BMI 24.74 kg/m   Constitutional: No distress . Vital signs reviewed. HENT: Normocephalic.  Atraumatic. Eyes: EOMI. No discharge. Cardiovascular: No JVD. RRR. Respiratory: Normal effort.  No stridor. Bilaterally clear to ausculation.  GI: Non-distended. BS+. Skin: Warm and dry.  Intact. Psych: Normal mood.  Normal behavior. Musc: No edema in extremities.  No tenderness in extremities. Neuro: Alert Motor: 4+-5/5 throughout, unchanged No dysmetria noted Visual fields intact  Assessment/Plan: 1. Functional deficits secondary to glioma status post resection which require 3+ hours per day of interdisciplinary therapy in a comprehensive inpatient rehab setting.  Physiatrist is providing close team supervision and 24 hour management of active medical problems listed below.  Physiatrist and rehab team continue to assess barriers to discharge/monitor patient progress toward functional and medical goals  Care Tool:  Bathing  Bathing activity did not occur:  (N/A) Body parts bathed by patient: Right arm, Face, Left arm, Chest, Abdomen, Front perineal area, Buttocks, Right upper leg, Left upper  leg, Right lower leg, Left lower leg         Bathing assist Assist Level: Supervision/Verbal cueing     Upper Body Dressing/Undressing Upper body dressing Upper body dressing/undressing activity did not occur (including orthotics): N/A What is the patient wearing?: Pull over shirt, Bra    Upper body assist Assist Level: Set up assist    Lower Body Dressing/Undressing Lower body dressing    Lower body dressing activity did not occur: N/A What is the patient wearing?: Underwear/pull up, Pants     Lower body assist Assist for lower body dressing: Set up assist     Toileting Toileting    Toileting assist Assist for toileting: Supervision/Verbal cueing     Transfers Chair/bed transfer  Transfers assist     Chair/bed transfer assist level: Supervision/Verbal cueing Chair/bed transfer assistive device: Armrests   Locomotion Ambulation   Ambulation assist      Assist level: Supervision/Verbal cueing Assistive device: No Device Max distance: >150 ft   Walk 10 feet activity   Assist     Assist level: Supervision/Verbal cueing Assistive device: No Device   Walk 50 feet activity   Assist    Assist level: Supervision/Verbal cueing Assistive device: No Device    Walk 150 feet activity   Assist    Assist level: Supervision/Verbal cueing Assistive device: No Device    Walk 10 feet on uneven surface  activity   Assist     Assist level: Supervision/Verbal cueing Assistive device:  (none)  Wheelchair     Assist Will patient use wheelchair at discharge?: No (pt ambulatory) Type of Wheelchair:  (functional ambulator) Wheelchair activity did not occur: N/A (pt ambulatory)         Wheelchair 50 feet with 2 turns activity    Assist    Wheelchair 50 feet with 2 turns activity did not occur: N/A       Wheelchair 150 feet activity     Assist Wheelchair 150 feet activity did not occur: N/A          Medical Problem List  and Plan: 1.  Altered mental status with gait abnormality secondary to glioma.  Status post left craniotomy resection of tumor 11/26/2019.    Decadron tapered off 7/13.  DC today  Will see patient for hospital follow up in 1 month post-discharge  Functional field cut noted on discussion with therapies, however no field cut noted on testing 2.  Antithrombotics: -DVT/anticoagulation: Lovenox initiated 11/27/2019             -antiplatelet therapy: N/A 3. Pain Management: Hydrocodone 4. Mood: Provide motional support             -antipsychotic agents: N/A 5. Neuropsych: This patient is ?fully capable of making decisions on her own behalf. 6. Skin/Wound Care: Routine skin checks 7. Fluids/Electrolytes/Nutrition: Routine in and outs 8.  Seizure prophylaxis.  Keppra 500 mg twice daily x1 week, discussed with pharmacy, d/ced 9.  Hypertension.  Clonidine 0.3 mg nightly, Norvasc 5 mg daily.    Controlled on 7/14  Monitor with increased mobility 10.  Hyperlipidemia.  Continue Pravachol 11.  Steroid-induced hyperglycemia on prediabetes.  SSI.  Controlled on 7/12, CBGs DC'd 12. Glioblastoma: .     Pathology positive for Grade 4 glioblastoma  Plan for radiation and chemotherapy- neuro-onc and rad onc are following.  13.  Leukocytosis: Resolved  WBC 10.2 on 7/12  Remains afebrile 14. Transaminitis: Resolved  LFTs within normal limits on 7/12  Cont to monitor 15. Moderate Hypoalbuminemia  Supplement initiated, persistent-we will need to monitor with initiation of chemo  > 30 minutes spent in total in discharge planning between myself and PA regarding aforementioned, as well discussion regarding DME equipment, follow-up appointments, follow-up therapies, discharge medications, discharge recommendations  LOS: 8 days A FACE TO [place] 12/10/2019, 8:46 AM

## 2019-12-10 NOTE — Progress Notes (Signed)
Patient ID: Wanda Ross, female   DOB: August 04, 1950, 69 y.o.   MRN: [id number]   Patient and daughter declined bedside commode from [place]. Due to feeling like patient will not require at home.

## 2019-12-10 NOTE — Discharge Instructions (Signed)
Inpatient Rehab Discharge Instructions  Wanda Ross Discharge date and time: No discharge date for patient encounter.   Activities/Precautions/ Functional Status: Activity: activity as tolerated Diet: regular diet Wound Care: Keep wound clean and dry Functional status:  ___ No restrictions     ___ Walk up steps independently ___ 24/7 supervision/assistance   ___ Walk up steps with assistance ___ Intermittent supervision/assistance  ___ Bathe/dress independently ___ Walk with walker     _x__ Bathe/dress with assistance ___ Walk Independently    ___ Shower independently ___ Walk with assistance    ___ Shower with assistance ___ No alcohol     ___ Return to work/school ________  COMMUNITY REFERRALS UPON DISCHARGE:    Outpatient: PT     OT    ST             Agency: [facility] [place] Phone: [phone]              Appointment Date/Time: Facility to Determine    Special Instructions: No driving smoking or alcohol  Follow-up Dr. Mickeal Skinner of medical oncology in regards to possible chemotherapy and radiation   My questions have been answered and I understand these instructions. I will adhere to these goals and the provided educational materials after my discharge from the hospital.  Patient/Caregiver Signature _______________________________ Date __________  Clinician Signature _______________________________________ Date __________  Please bring this form and your medication list with you to all your follow-up doctor's appointments.

## 2019-12-10 NOTE — Progress Notes (Signed)
Patient discarge home with daughter. Discharge instructions given by Wanda Ross during shift. No questions noted at time. Adria Devon, LPN

## 2019-12-10 NOTE — Patient Care Conference (Signed)
Inpatient RehabilitationTeam Conference and Plan of Care Update Date: 12/10/2019   Time: 2:41 PM    Patient Name: Wanda Ross      Medical Record Number: [id number]  Date of Birth: 1950-06-01 Sex: Female         Room/Bed: 4W15C/4W15C-01 Payor Info: Payor: [employer] MEDICARE / Plan: [employer] MEDICARE CHOICE PPO / Product Type: *No Product type* /    Admit Date/Time:  12/02/2019  4:03 PM  Primary Diagnosis:  Glioma [facility])  Hospital Problems: Principal Problem:   Glioma ([place]) Active Problems:   Prediabetes   Seizure prophylaxis   Hypoalbuminemia due to protein-calorie malnutrition ([place])   Transaminitis   Benign essential HTN   Glioblastoma [facility])    Expected Discharge Date: Expected Discharge Date: 12/10/19  Team Members Present: Physician leading conference: Dr. Delice Lesch Care Coodinator Present: Dorien Chihuahua, RN, BSN, CRRN;Christina Sampson Goon, BSW Nurse Present: Ellison Carwin, LPN PT Present: Lavone Nian, PT OT Present: Leretha Pol, OT SLP Present: Jettie Booze, CF-SLP PPS Coordinator present : Gunnar Fusi, SLP     Current Status/Progress Goal Weekly Team Focus  Bowel/Bladder   Pt is continent of bowel/bladder. LBM is 12/09/19  To remain continent of bowel/bladder.  Assess tolieting needs and answer call lights promptly.   Swallow/Nutrition/ Hydration             ADL's             Mobility             Communication             Safety/Cognition/ Behavioral Observations            Pain   No complaints of pain.  To remain pain free.  Assess pain q shift or prn.   Skin   Has surgical incision to left side of head that has staples and is OTA.  To promote healing and prevent infection.  Assess skin q shift or prn.     Team Discussion:  Discharge Planning/Teaching Needs:  Goal to discharge home with son and daughter to asisst  Daughter attended education on Friday 7/9   Current Update: on target  Current Barriers to Discharge:  Pending  chemo/radiation  Possible Resolutions to Barriers: Follow up with MD after discharge from rehab  Patient on target to meet rehab goals: yes  *See Care Plan and progress notes for long and short-term goals.   Revisions to Treatment Plan:      Medical Summary                 I attest that I was present, lead the team conference, and concur with the assessment and plan of the team.   Dorien Chihuahua B 12/10/2019, 2:41 PM

## 2019-12-10 NOTE — Progress Notes (Signed)
Inpatient Rehabilitation Care Coordinator  Discharge Note  The overall goal for the admission was met for:   Discharge location: Yes, home   Length of Stay: Yes, 8 Days  Discharge activity level: Yes, ambulatory supervision  Home/community participation: Yes  Services provided included: MD, RD, PT, OT, SLP, RN, CM, TR, Pharmacy and [place]: Private Insurance: [facility] Medicare  Follow-up services arranged: Outpatient: [facility]   Comments (or additional information):  Patient/Family verbalized understanding of follow-up arrangements: Yes  Individual responsible for coordination of the follow-up plan: amy, [phone]  Confirmed correct DME delivered: Dyanne Iha 12/10/2019    Dyanne Iha

## 2019-12-15 ENCOUNTER — Inpatient Hospital Stay: Payer: Medicare PPO | Admitting: Internal Medicine

## 2019-12-15 ENCOUNTER — Other Ambulatory Visit: Payer: Self-pay

## 2019-12-15 VITALS — BP 175/99 | HR 90 | Temp 98.1°F | Resp 18 | Ht 68.0 in | Wt 149.2 lb

## 2019-12-15 DIAGNOSIS — Z7189 Other specified counseling: Secondary | ICD-10-CM | POA: Diagnosis not present

## 2019-12-15 DIAGNOSIS — Z803 Family history of malignant neoplasm of breast: Secondary | ICD-10-CM | POA: Diagnosis not present

## 2019-12-15 DIAGNOSIS — C719 Malignant neoplasm of brain, unspecified: Secondary | ICD-10-CM

## 2019-12-15 DIAGNOSIS — C713 Malignant neoplasm of parietal lobe: Secondary | ICD-10-CM

## 2019-12-15 NOTE — Progress Notes (Signed)
Location/Histology of Brain Tumor: Left Parietal  GBM  Patient presented with symptoms of:  Family brought her to the ER with concerns that she was not acting right. She was having some gait imbalance and confusion.  MRI Brain 11/27/2019: Postoperative changes from interval left parietal and splenial tumor resection.  Mild enhancement along the margins of the resection cavity.  This will serve as a baseline from follow-up examinations.  CT CAP 11/23/2019: No acute findings within the chest, abdomen, or pelvis.  No evidence of a primary malignancy within the chest, abdomen, or pelvis.  Small low density liver lesion consistent with a cyst.  MRI Brain 11/22/2019: Heterogeneously 4.8 x 3.3 x 3 cm contrast enhancing mass within the left parietal white matter extending along the corpus callosum splenium, most consistent with high grade glioma.  Moderate surrounding hyperintense T2 weighted signal, likely a combination of edema and non enhancing tumor.  3 mm of rightward midline shift.   CT Head 11/21/2019: Ill defined 3.2 cm mass within the left parietal white matter, compatible with malignancy.  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  Extensive vasogenic edema throughout the left cerebral hemisphere, with significant sulcal effacement and minimal rightward midline shift.  Past or anticipated interventions, if any, per neurosurgery:  Dr. Marcello Moores -Left parietal craniotomy 11/26/2019 - Follow-up 12/15/2019  Past or anticipated interventions, if any, per medical oncology:  Dr. Mickeal Skinner 12/15/2019 -We ultimately recommended proceeding with full 6 weeks course of intensity modulated radiation therapy and concurrent daily Temozolomide.  -Radiation will be administered Mon-Fri over 6 weeks, Temodar will be dosed at 75mg /m2 to be given daily over 42 days.   Dose of Decadron, if applicable: Done  Recent neurologic symptoms, if any:   Seizures: No  Headaches: no  Nausea:  No  Dizziness/ataxia: no  Difficulty with hand coordination: No  Focal numbness/weakness: no   Visual deficits/changes: No  Confusion/Memory deficits: No   SAFETY ISSUES:  Prior radiation? No  Pacemaker/ICD? no  Possible current pregnancy? Postmenopausal  Is the patient on methotrexate? no  Additional Complaints / other details:

## 2019-12-15 NOTE — Progress Notes (Signed)
[facility] [place], [place] [address] [phone]   New Patient Evaluation  Date of Service: 12/15/19 Patient Name: Wanda Ross Patient MRN: [id number] Patient DOB: 12/12/1950 Provider: Ventura Sellers, MD  Identifying Statement:  Wanda Ross is a 69 y.o. female with left frontal glioblastoma who presents for initial consultation and evaluation.    Referring Provider: Fayrene Helper, MD [address] [place],  [place] [address]  Oncologic History: 11/26/19: Craniotomy, resection of L parietal mass with Dr. Marcello Moores; path is GBM  Biomarkers:  MGMT Unknown.  IDH 1/2 Unknown.  EGFR Unknown  TERT Unknown   History of Present Illness: The patient's records from the referring physician were obtained and reviewed and the patient interviewed to confirm this HPI.  Wanda Ross presented to medical attention in late June with several weeks of progressive symptoms, mainly characterized by gait impairment, confusion and disorientation.  CNS imaging was obtained which demonstrated 4-5cm mass in the left parietal region, consistent with likely brain brain tumor.  She underwent craniotomy and resection with Dr. Marcello Moores on 11/26/19.  Following surgery she developed some right sided weakness, but this recovered to essentially full strength following nearly 2 weeks course of inpatient rehab.  Currently, she is living at home with her sister, although prior to this she was living alone.  She maintains full functional status, no limitations at present time.  Retired middle school principal.  Medications: Current Outpatient Medications on File Prior to Visit  Medication Sig Dispense Refill  . amLODipine (NORVASC) 5 MG tablet Take 1 tablet (5 mg total) by mouth daily. 90 tablet 1  . Calcium Carbonate-Vit D-Min (CALCIUM 1200) 1200-1000 MG-UNIT CHEW Chew 1,200 mg by mouth daily. 30 tablet 1  . cloNIDine (CATAPRES) 0.3  MG tablet TAKE 1 TABLET BY MOUTH EVERYDAY AT BEDTIME 90 tablet 0  . Cyanocobalamin 2500 MCG TABS Take 2,500 mcg by mouth daily. 30 tablet 0  . docusate sodium (COLACE) 100 MG capsule Take 1 capsule (100 mg total) by mouth 2 (two) times daily. 10 capsule 0  . dorzolamide-timolol (COSOPT) 22.3-6.8 MG/ML ophthalmic solution Place 1 drop into the right eye 2 (two) times daily.     . Multiple Vitamins-Minerals (CENTRUM SILVER ULTRA WOMENS) TABS Take 1 tablet by mouth daily.     . pantoprazole (PROTONIX) 40 MG tablet Take 1 tablet (40 mg total) by mouth daily. 30 tablet 0  . pravastatin (PRAVACHOL) 10 MG tablet TAKE 1 TABLET BY MOUTH DAILY *DOSE REDUCTION* 90 tablet 1  . acetaminophen (TYLENOL) 325 MG tablet Take 2 tablets (650 mg total) by mouth every 4 (four) hours as needed for mild pain (temp > 100.5). (Patient not taking: Reported on 12/15/2019)    . HYDROcodone-acetaminophen (NORCO/VICODIN) 5-325 MG tablet Take 1 tablet by mouth every 4 (four) hours as needed for moderate pain. (Patient not taking: Reported on 12/15/2019) 30 tablet 0   No current facility-administered medications on file prior to visit.    Allergies: No Known Allergies Past Medical History:  Past Medical History:  Diagnosis Date  . Glaucoma   . Hyperlipidemia 2010  . Hypertension 1990  . IGT (impaired glucose tolerance) 05/11/2010  . Multinodular goiter 2010   normal function   Past Surgical History:  Past Surgical History:  Procedure Laterality Date  . APPLICATION OF CRANIAL NAVIGATION Left 11/26/2019   Procedure: APPLICATION OF CRANIAL NAVIGATION;  Surgeon: [name], MD;  Location:  [facility];  Service: Neurosurgery;  Laterality: Left;  APPLICATION OF CRANIAL NAVIGATION  . COLONOSCOPY N/A 06/16/2016   Procedure: COLONOSCOPY;  Surgeon: Danie Binder, MD;  [facility];  Service: Endoscopy;  Laterality: N/A;  9:30 AM  . COLONOSCOPY N/A 10/22/2019   Procedure: COLONOSCOPY;  Surgeon: Daneil Dolin, MD;   [facility];  Service: Endoscopy;  Laterality: N/A;  9:30  . CRANIOTOMY Left 11/26/2019   Procedure: LEFT PARIETAL CRANIOTOMY FOR RESECTION OF TUMOR;  Surgeon: [name], MD;  Location: [facility];  Service: Neurosurgery;  Laterality: Left;  LEFT PARIETAL CRANIOTOMY FOR RESECTION OF TUMOR  . EYE SURGERY  2017   3 on left eye 1 on right eye for glaucoma by Dr. Venetia Maxon   . EYE SURGERY  2018   Cataract Surgery by Dr. Arlina Robes   . OPERATIVE ULTRASOUND N/A 11/26/2019   Procedure: OPERATIVE ULTRASOUND;  Surgeon: [name], MD;  Location: [facility];  Service: Neurosurgery;  Laterality: N/A;  OPERATIVE ULTRASOUND   Social History:  Social History   Socioeconomic History  . Marital status: Widowed    Spouse name: Not on file  . Number of children: 3  . Years of education: Not on file  . Highest education level: Not on file  Occupational History  . Occupation: employed   Tobacco Use  . Smoking status: Never Smoker  . Smokeless tobacco: Never Used  Vaping Use  . Vaping Use: Never used  Substance and Sexual Activity  . Alcohol use: No  . Drug use: No  . Sexual activity: Never    Birth control/protection: Post-menopausal  Other Topics Concern  . Not on file  Social History Narrative  . Not on file   Social Determinants of Health   Financial Resource Strain:   . Difficulty of Paying Living Expenses:   Food Insecurity:   . Worried About Charity fundraiser in the Last Year:   . Arboriculturist in the Last Year:   Transportation Needs:   . Film/video editor (Medical):   Marland Kitchen Lack of Transportation (Non-Medical):   Physical Activity:   . Days of Exercise per Week:   . Minutes of Exercise per Session:   Stress:   . Feeling of Stress :   Social Connections:   . Frequency of Communication with Friends and Family:   . Frequency of Social Gatherings with Friends and Family:   . Attends Religious Services:   . Active Member of Clubs or Organizations:   . Attends  Archivist Meetings:   Marland Kitchen Marital Status:   Intimate Partner Violence:   . Fear of Current or Ex-Partner:   . Emotionally Abused:   Marland Kitchen Physically Abused:   . Sexually Abused:    Family History:  Family History  Problem Relation Age of Onset  . Dementia Father   . Dementia Mother   . Diabetes Mother   . Hypertension Mother   . Breast cancer Sister   . Hypertension Sister   . Diabetes Brother     Review of Systems: Constitutional: Doesn't report fevers, chills or abnormal weight loss Eyes: Doesn't report blurriness of vision Ears, nose, mouth, throat, and face: Doesn't report sore throat Respiratory: Doesn't report cough, dyspnea or wheezes Cardiovascular: Doesn't report palpitation, chest discomfort  Gastrointestinal:  Doesn't report nausea, constipation, diarrhea GU: Doesn't report incontinence Skin: Doesn't report skin rashes Neurological: Per HPI Musculoskeletal: Doesn't report joint pain Behavioral/Psych: Doesn't report anxiety  Physical  Exam: Vitals:   12/15/19 1024  BP: (!) 175/99  Pulse: 90  Resp: 18  Temp: 98.1 F (36.7 C)  SpO2: 100%   KPS: 80. General: Alert, cooperative, pleasant, in no acute distress Head: Normal EENT: No conjunctival injection or scleral icterus.  Lungs: Resp effort normal Cardiac: Regular rate Abdomen: Non-distended abdomen Skin: No rashes cyanosis or petechiae. Extremities: No clubbing or edema  Neurologic Exam: Mental Status: Awake, alert, attentive to examiner. Oriented to self and environment. Language is fluent with intact comprehension.  Cranial Nerves: Visual acuity is grossly normal. Visual fields are full. Extra-ocular movements intact. No ptosis. Face is symmetric Motor: Tone and bulk are normal. Power is full in both arms and legs. Reflexes are symmetric, no pathologic reflexes present.  Sensory: Intact to light touch Gait: Modestly hemiparetic.   Labs: I have reviewed the data as listed    Component  Value Date/Time   NA 140 12/08/2019 0647   K 4.5 12/08/2019 0647   CL 104 12/08/2019 0647   CO2 28 12/08/2019 0647   GLUCOSE 100 (H) 12/08/2019 0647   BUN 17 12/08/2019 0647   CREATININE 0.75 12/08/2019 0647   CREATININE 0.86 05/21/2019 0754   CALCIUM 8.5 (L) 12/08/2019 0647   PROT 5.3 (L) 12/08/2019 0647   ALBUMIN 2.8 (L) 12/08/2019 0647   AST 15 12/08/2019 0647   ALT 25 12/08/2019 0647   ALKPHOS 36 (L) 12/08/2019 0647   BILITOT 0.4 12/08/2019 0647   GFRNONAA >60 12/08/2019 0647   GFRNONAA 68 05/21/2019 0754   GFRAA >60 12/08/2019 0647   GFRAA 79 05/21/2019 0754   Lab Results  Component Value Date   WBC 10.2 12/08/2019   NEUTROABS 8.0 (H) 12/08/2019   HGB 13.3 12/08/2019   HCT 40.4 12/08/2019   MCV 86.5 12/08/2019   PLT 267 12/08/2019    Imaging:  DG Chest 2 View  Result Date: 11/22/2019 CLINICAL DATA:  Encephalopathy.  Newly discovered brain mass. EXAM: CHEST - 2 VIEW COMPARISON:  None. FINDINGS: The heart size and mediastinal contours are within normal limits. Both lungs are clear. The visualized skeletal structures are unremarkable. IMPRESSION: No active cardiopulmonary disease. Electronically Signed   By: Ulyses Jarred M.D.   On: 11/22/2019 04:03   CT Head Wo Contrast  Result Date: 11/22/2019 CLINICAL DATA:  Altered level of consciousness for 4 days, dizziness EXAM: CT HEAD WITHOUT CONTRAST TECHNIQUE: Contiguous axial images were obtained from the base of the skull through the vertex without intravenous contrast. COMPARISON:  None. FINDINGS: Brain: There is significant vasogenic edema in the left frontal parietal region. 3.2 cm mass is seen within the left parietal white matter. MRI with and without contrast is recommended for further evaluation. Hypodensity extends along the posterior aspect of the corpus callosum, either extension of the mass or edema. I do not see any definite hemorrhage. There is significant effacement of the left lateral ventricle, with rightward  midline shift measuring up to 4 mm at the septum pellucidum. There is diffuse sulcal effacement throughout the left cerebral hemisphere, greatest along the convexity. No acute extra-axial fluid collections. Vascular: No hyperdense vessel or unexpected calcification. Skull: Normal. Negative for fracture or focal lesion. Sinuses/Orbits: Small metallic densities are seen within the globes bilaterally, likely related to prior glaucoma surgery. Please correlate with surgical history. Otherwise the orbits are unremarkable. Other: None. IMPRESSION: 1. 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 MRI  with and without contrast is recommended. 2. Extensive vasogenic edema throughout the left cerebral hemisphere, with significant sulcal effacement and minimal rightward midline shift. Significant effacement of the left lateral ventricle. These results were called by telephone at the time of interpretation on 11/22/2019 at 12:34 am to provider IVA KNAPP , who verbally acknowledged these results. 1. Extensive cerebral edema Electronically Signed   By: Randa Ngo M.D.   On: 11/22/2019 00:34   CT CHEST W CONTRAST  Result Date: 11/23/2019 CLINICAL DATA:  69 y.o. female was brought to the ED due to confusion and gait imbalance. As per patient daughter, patient was having intermittent confusion. Patient denies chest pain, shortness of breath, nausea, vomiting or diarrhea. Mass seen on CT Head and Mri. EXAM: CT CHEST, ABDOMEN, AND PELVIS WITH CONTRAST TECHNIQUE: Multidetector CT imaging of the chest, abdomen and pelvis was performed following the standard protocol during bolus administration of intravenous contrast. CONTRAST:  165m OMNIPAQUE IOHEXOL 300 MG/ML  SOLN COMPARISON:  Head CT and brain MRI from 11/22/2019 FINDINGS: CT CHEST FINDINGS Cardiovascular: Heart is normal in size and configuration. No  pericardial effusion. No coronary artery calcifications. Great vessels are normal in caliber. No aortic atherosclerosis. Mediastinum/Nodes: No enlarged mediastinal, hilar, or axillary lymph nodes. Thyroid gland, trachea, and esophagus demonstrate no significant findings. Lungs/Pleura: Minor scarring at the base of the left upper lobe lingula. Minimal linear dependent lower lobe opacities consistent with atelectasis. Lungs otherwise clear with no masses or nodules. No pleural effusion or pneumothorax. Musculoskeletal: No fracture or acute finding. No osteoblastic or osteolytic lesions. CT ABDOMEN PELVIS FINDINGS Hepatobiliary: Liver normal in size and overall attenuation. Small low-density lesion between the anterior segment the right lobe and medial segment of the left, consistent with a cyst. Small amount of focal fat adjacent to the falciform ligament. No other abnormalities. Normal gallbladder. No bile duct dilation. Pancreas: Unremarkable. No pancreatic ductal dilatation or surrounding inflammatory changes. Spleen: Normal in size without focal abnormality. Adrenals/Urinary Tract: No adrenal masses. Kidneys normal in size, orientation and position with symmetric enhancement and excretion. Bilateral low-attenuation renal masses consistent with cysts, largest from the lower pole of the left kidney, 4.5 cm. No renal stones. No hydronephrosis. Normal ureters. Normal bladder. Stomach/Bowel: Small hiatal hernia. Stomach is otherwise within normal limits. Appendix appears normal. No evidence of bowel wall thickening, distention, or inflammatory changes. Vascular/Lymphatic: No significant vascular abnormality. No enlarged lymph nodes. Reproductive: Uterus and bilateral adnexa are unremarkable. Other: No abdominal wall hernia or abnormality. No abdominopelvic ascites. Musculoskeletal: No fracture or acute finding. No osteoblastic or osteolytic lesions. IMPRESSION: 1. No acute findings within the chest, abdomen or pelvis.  2. No evidence of a primary malignancy within the chest, abdomen or pelvis. 3. Small low-density liver lesion consistent with a cyst. Bilateral renal cysts. Electronically Signed   By: DLajean ManesM.D.   On: 11/23/2019 14:38   MR BRAIN W WO CONTRAST  Result Date: 11/27/2019 CLINICAL DATA:  Postop day 1 following resection of a left parietal tumor (likely high-grade glioma). EXAM: MRI HEAD WITHOUT AND WITH CONTRAST TECHNIQUE: Multiplanar, multiecho pulse sequences of the brain and surrounding structures were obtained without and with intravenous contrast. CONTRAST:  740mGADAVIST GADOBUTROL 1 MMOL/ML IV SOLN COMPARISON:  11/22/2019 FINDINGS: Brain: Sequelae of interval left parietal craniotomy and tumor resection are identified. There is a resection cavity in the left parietal lobe and splenium of the corpus callosum with a small amount of blood products. A small amount of restricted diffusion is noted  along the margins of the resection cavity without a sizable acute infarct. There is a small amount of enhancement along the margins of the resection cavity which is largely curvilinear and likely postoperative. There is more nodular enhancement at the anterior aspect of the resection site at the level of the atrium of the left lateral ventricle which is felt to at least partially reflect choroid plexus. There is moderate nonenhancing T2 hyperintensity/edema in the white matter of the left parietal, occipital, and posterior temporal lobes which has decreased from the prior study. There is persistent nonenhancing T2 hyperintensity which crosses the splenium of the corpus callosum into the right periatrial white matter. There is trace residual rightward midline shift, decreased from prior. There is small volume pneumocephalus, and there is a small extra-axial fluid collection subjacent to the craniotomy. Mild dural thickening and enhancement over the posterior left cerebral convexity is likely postoperative. Vascular:  Major intracranial vascular flow voids are preserved. Skull and upper cervical spine: Left parietal craniotomy with small volume fluid and gas in the scalp soft tissues. Sinuses/Orbits: Left cataract extraction. Paranasal sinuses and mastoid air cells are clear. Other: None. IMPRESSION: 1. Postoperative changes from interval left parietal and splenial tumor resection. Mild enhancement along the margins of the resection cavity as above. This will serve as a baseline for follow-up examinations. 2. Decreased surrounding T2 hyperintensity compatible with decreased edema and likely underlying nonenhancing tumor. Electronically Signed   By: Logan Bores M.D.   On: 11/27/2019 13:58   MR BRAIN W WO CONTRAST  Result Date: 11/23/2019 CLINICAL DATA:  Intracranial mass EXAM: MRI HEAD WITHOUT AND WITH CONTRAST TECHNIQUE: Multiplanar, multiecho pulse sequences of the brain and surrounding structures were obtained without and with intravenous contrast. CONTRAST:  52m GADAVIST GADOBUTROL 1 MMOL/ML IV SOLN COMPARISON:  None. FINDINGS: Brain: There is a peripherally and heterogeneously contrast-enhancing mass within the left parietal white matter extending along the corpus callosum splenium. Mass measures 4.8 x 3.3 x 3.0 cm. There is moderate surrounding hyperintense T2-weighted signal a crosses into the right hemisphere. Rightward midline shift measures 3 mm. There is petechial hemorrhage within the mass. Vascular: Normal flow voids. Skull and upper cervical spine: Normal marrow signal. Sinuses/Orbits: Negative. Other: None. IMPRESSION: 1. Heterogeneously contrast-enhancing mass within the left parietal white matter extending along the corpus callosum splenium, most consistent with high-grade glioma. 2. Moderate surrounding hyperintense T2-weighted signal, likely a combination of edema and nonenhancing tumor. 3. 3 mm of rightward midline shift. 4. Intratumoral petechial hemorrhage. Electronically Signed   By: KUlyses JarredM.D.    On: 11/23/2019 00:09   UKoreaIntraoperative  Result Date: 11/26/2019 CLINICAL DATA:  Ultrasound was provided for use by the ordering physician, and a technical charge was applied by the performing facility.  No radiologist interpretation/professional services rendered.   CT ABDOMEN PELVIS W CONTRAST  Result Date: 11/23/2019 CLINICAL DATA:  69y.o. female was brought to the ED due to confusion and gait imbalance. As per patient daughter, patient was having intermittent confusion. Patient denies chest pain, shortness of breath, nausea, vomiting or diarrhea. Mass seen on CT Head and Mri. EXAM: CT CHEST, ABDOMEN, AND PELVIS WITH CONTRAST TECHNIQUE: Multidetector CT imaging of the chest, abdomen and pelvis was performed following the standard protocol during bolus administration of intravenous contrast. CONTRAST:  1019mOMNIPAQUE IOHEXOL 300 MG/ML  SOLN COMPARISON:  Head CT and brain MRI from 11/22/2019 FINDINGS: CT CHEST FINDINGS Cardiovascular: Heart is normal in size and configuration. No pericardial effusion. No coronary artery  calcifications. Great vessels are normal in caliber. No aortic atherosclerosis. Mediastinum/Nodes: No enlarged mediastinal, hilar, or axillary lymph nodes. Thyroid gland, trachea, and esophagus demonstrate no significant findings. Lungs/Pleura: Minor scarring at the base of the left upper lobe lingula. Minimal linear dependent lower lobe opacities consistent with atelectasis. Lungs otherwise clear with no masses or nodules. No pleural effusion or pneumothorax. Musculoskeletal: No fracture or acute finding. No osteoblastic or osteolytic lesions. CT ABDOMEN PELVIS FINDINGS Hepatobiliary: Liver normal in size and overall attenuation. Small low-density lesion between the anterior segment the right lobe and medial segment of the left, consistent with a cyst. Small amount of focal fat adjacent to the falciform ligament. No other abnormalities. Normal gallbladder. No bile duct dilation.  Pancreas: Unremarkable. No pancreatic ductal dilatation or surrounding inflammatory changes. Spleen: Normal in size without focal abnormality. Adrenals/Urinary Tract: No adrenal masses. Kidneys normal in size, orientation and position with symmetric enhancement and excretion. Bilateral low-attenuation renal masses consistent with cysts, largest from the lower pole of the left kidney, 4.5 cm. No renal stones. No hydronephrosis. Normal ureters. Normal bladder. Stomach/Bowel: Small hiatal hernia. Stomach is otherwise within normal limits. Appendix appears normal. No evidence of bowel wall thickening, distention, or inflammatory changes. Vascular/Lymphatic: No significant vascular abnormality. No enlarged lymph nodes. Reproductive: Uterus and bilateral adnexa are unremarkable. Other: No abdominal wall hernia or abnormality. No abdominopelvic ascites. Musculoskeletal: No fracture or acute finding. No osteoblastic or osteolytic lesions. IMPRESSION: 1. No acute findings within the chest, abdomen or pelvis. 2. No evidence of a primary malignancy within the chest, abdomen or pelvis. 3. Small low-density liver lesion consistent with a cyst. Bilateral renal cysts. Electronically Signed   By: Lajean Manes M.D.   On: 11/23/2019 14:38    Pathology:  SURGICAL PATHOLOGY  CASE: [id number]  PATIENT: Wanda Ross  Surgical Pathology Report   Clinical History: brain tumor (cm)    FINAL MICROSCOPIC DIAGNOSIS:   A. BRAIN, LEFT CORPUS CALLOSUM MASS, BIOPSY:  - Glioblastoma, WHO grade 4, see comment   B. BRAIN, LEFT CORPUS CALLOSUM MASS, EXCISION:  - Glioblastoma, WHO grade 4, see comment    COMMENT:   This case was sent to Dr. Blanca Friend at [facility]ology. A  copy of the outside report is available in patient's electronic medical  records. Dr. Duffy Rhody was paged on 12/05/2019.    INTRAOPERATIVE DIAGNOSIS:   A. BRAIN, LEFT CORPUS CALLOSUM MASS, BIOPSY, FROZEN SECTION:    Lesional  tissue present.    Rapid intraoperative consult diagnosis rendered by Dr. Jeannie Done @  1045 11/26/2019.    GROSS DESCRIPTION:   Specimen A: Received fresh for rapid intraoperative consult by frozen  section is a 1.2 x 0.7 x 0.5 cm pink-gray to pale yellow-red soft  tissue. The specimen is sectioned, approximately half submitted in  block 1 for frozen section, squash preparations made on 2 slides, and  remaining specimen submitted in block 2 for routine histology.   Specimen B: Received fresh is a 3.2 x 2.1 x 1.4 cm irregular portion of  soft tissue with pink-red to pale yellow cut surfaces. Sectioned and  entirely submitted in 6 blocks.   SW 11/26/2019   Final Diagnosis performed by Jaquita Folds, MD.     Assessment/Plan Glioblastoma [facility]) [C71.9]  We appreciate the opportunity to participate in the care of Wanda Ross.  Today we reviewed her case from a clinical, radiologic, histologic standpoint.     We had an extensive conversation with her regarding pathology, prognosis, and  available treatment pathways.  We are encouraged by the good quality of her resection and her excellent functional status.  Her general health outside the brain tumor is also very good for her age.   We ultimately recommended proceeding with full 6 weeks course of intensity modulated radiation therapy and concurrent daily Temozolomide.  Radiation will be administered Mon-Fri over 6 weeks, Temodar will be dosed at 42m/m2 to be given daily over 42 days.  We reviewed side effects of temodar, including fatigue, nausea/vomiting, constipation, and cytopenias.  Chemotherapy should be held for the following:  ANC less than 1,000  Platelets less than 100,000  LFT or creatinine greater than 2x ULN  If clinical concerns/contraindications develop  Every 2 weeks during radiation, labs will be checked accompanied by a clinical evaluation in the brain tumor clinic.  Screening for potential clinical  trials was performed and discussed using eligibility criteria [facility], as well as national database available on [email or web address]    The patient is a candidate and is interested in learning more about our study: "Testing Ramipril to Prevent Memory Loss in People With Glioblastoma".  More information regarding the study will be provided via phone through clinical research nurse team.  We spent twenty additional minutes teaching regarding the natural history, biology, and historical experience in the treatment of brain tumors. We then discussed in detail the current recommendations for therapy focusing on the mode of administration, mechanism of action, anticipated toxicities, and quality of life issues associated with this plan. We also provided teaching sheets for the patient to take home as an additional resource.  All questions were answered. The patient knows to call the clinic with any problems, questions or concerns. No barriers to learning were detected.  The total time spent in the encounter was 60 minutes and more than 50% was on counseling and review of test results   ZVentura Sellers MD Medical Director of Neuro-Oncology [facility]07/19/21 4:20 PM

## 2019-12-16 ENCOUNTER — Telehealth: Payer: Self-pay

## 2019-12-16 ENCOUNTER — Ambulatory Visit
Admission: RE | Admit: 2019-12-16 | Discharge: 2019-12-16 | Disposition: A | Discharge status: Home / Self Care | Payer: Medicare PPO | Source: Ambulatory Visit | Attending: Radiation Oncology | Admitting: Radiation Oncology

## 2019-12-16 ENCOUNTER — Encounter (HOSPITAL_COMMUNITY): Payer: Self-pay

## 2019-12-16 ENCOUNTER — Ambulatory Visit
Admit: 2019-12-16 | Discharge: 2019-12-16 | Disposition: A | Discharge status: Home / Self Care | Payer: Medicare PPO | Attending: Radiation Oncology | Admitting: Radiation Oncology

## 2019-12-16 ENCOUNTER — Encounter: Payer: Self-pay | Admitting: Radiation Oncology

## 2019-12-16 VITALS — Ht 68.0 in | Wt 149.0 lb

## 2019-12-16 DIAGNOSIS — C713 Malignant neoplasm of parietal lobe: Secondary | ICD-10-CM

## 2019-12-16 DIAGNOSIS — C719 Malignant neoplasm of brain, unspecified: Secondary | ICD-10-CM

## 2019-12-16 DIAGNOSIS — Z9089 Acquired absence of other organs: Secondary | ICD-10-CM | POA: Diagnosis not present

## 2019-12-16 NOTE — Telephone Encounter (Signed)
[id number] - TESTING RAMIPRIL TO PREVENT MEMORY LOSS IN PEOPLE WITH GLIOBLASTOMA  12/16/2019 15:48PM  TELEPHONE CALL: Very brief (< 65min) outgoing call to discuss potential participation in the [id number] Ramipril study. Bayler has a [facility] appointment scheduled for tomorrow and asks if we can meet immediately following that appointment. I stated that I will schedule an appointment for 12 noon but in the event she finishes sooner in the radiation department, I will have them page me so we can meet sooner. Current plan is to meet with [facility] tomorrow to review the above study, provide study materials, and potentially plan for a consenting and baseline visit prior to beginning treatment on 12/29/19. Megahn is thanked for her time and willingness to meet with me. Radiation department was notified to page/call me immediately following her appointment.  Dionne Bucy. Sharlett Iles, BSN, RN, CIC 12/16/2019 4:26 PM

## 2019-12-16 NOTE — Progress Notes (Addendum)
Radiation Oncology         [phone] ________________________________  Initial Outpatient Consultation - Conducted via telephone due to current COVID-19 concerns for limiting patient exposure  I spoke with the patient to conduct this consult visit via telephone to spare the patient unnecessary potential exposure in the healthcare setting during the current COVID-19 pandemic. The patient was notified in advance and was offered a Alpha meeting to allow for face to face communication but unfortunately reported that they did not have the appropriate resources/technology to support such a visit and instead preferred to proceed with a telephone consult.   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   HISTORY OF PRESENT ILLNESS: Wanda Ross is a 69 y.o. female seen at the request of Dr. Mickeal Skinner for a new diagnosis of glioblastoma.  The patient was originally brought by family on 11/21/2019 to the emergency department as she had not been acting herself for several days.  A CT of the head that day revealed a 3.2 cm mass in the left parietal white matter concerning for malignancy with extensive vasogenic edema, significant sulcal effacement and minimal rightward midline shift and effacement of the left lateral ventricle were identified.  An MRI of the brain with and without contrast on 11/22/2019 revealed heterogeneously contrast-enhancing mass in the left parietal white matter measuring 4.8 x 3.3 x 3 cm.  She had a negative work-up for metastatic disease, and was counseled on surgical resection.  She was taken to the operating room by Dr. Duffy Rhody on 11/26/2019 for a left parietal craniotomy and resection of tumor.  Final pathology reveals glioblastoma.  Her  postoperative MRI on 11/27/2019 revealed postoperative changes with interval left parietal and splenial tumor resection with mild enhancement along the margins of the resection cavity with more nodular enhancement at the anterior aspect of the site at the level of the atrium of the left lateral ventricle.  She has been recovering from her procedure, and is contacted today to discuss options of chemoradiation.  She met with Dr. Mickeal Skinner yesterday to discuss chemoRT.  PREVIOUS RADIATION THERAPY: No   PAST MEDICAL HISTORY:  Past Medical History:  Diagnosis Date  . Glaucoma   . Hyperlipidemia 2010  . Hypertension 1990  . IGT (impaired glucose tolerance) 05/11/2010  . Multinodular goiter 2010   normal function       PAST SURGICAL HISTORY: Past Surgical History:  Procedure Laterality Date  . APPLICATION OF CRANIAL NAVIGATION Left 11/26/2019   Procedure: APPLICATION OF CRANIAL NAVIGATION;  Surgeon: [name], MD;  Location: [place];  Service: Neurosurgery;  Laterality: Left;  APPLICATION OF CRANIAL NAVIGATION  . COLONOSCOPY N/A 06/16/2016   Procedure: COLONOSCOPY;  Surgeon: Danie Binder, MD;  [facility];  Service: Endoscopy;  Laterality: N/A;  9:30 AM  . COLONOSCOPY N/A 10/22/2019   Procedure: COLONOSCOPY;  Surgeon: Daneil Dolin, MD;  [facility];  Service: Endoscopy;  Laterality: N/A;  9:30  . CRANIOTOMY Left 11/26/2019   Procedure: LEFT PARIETAL CRANIOTOMY FOR RESECTION OF TUMOR;  Surgeon: [name], MD;  Location: [place];  Service: Neurosurgery;  Laterality: Left;  LEFT PARIETAL CRANIOTOMY FOR RESECTION OF TUMOR  . EYE SURGERY  2017  3 on left eye 1 on right eye for glaucoma by Dr. Venetia Maxon   . EYE SURGERY  2018   Cataract Surgery by Dr. Arlina Robes   . OPERATIVE ULTRASOUND N/A 11/26/2019   Procedure: OPERATIVE ULTRASOUND;  Surgeon: [name], MD;  Location: [place];  Service: Neurosurgery;  Laterality: N/A;  OPERATIVE ULTRASOUND     FAMILY  HISTORY:  Family History  Problem Relation Age of Onset  . Dementia Father   . Dementia Mother   . Diabetes Mother   . Hypertension Mother   . Breast cancer Sister   . Hypertension Sister   . Diabetes Brother      SOCIAL HISTORY:  reports that she has never smoked. She has never used smokeless tobacco. She reports that she does not drink alcohol and does not use drugs.   ALLERGIES: Patient has no known allergies.   MEDICATIONS:  Current Outpatient Medications  Medication Sig Dispense Refill  . amLODipine (NORVASC) 5 MG tablet Take 1 tablet (5 mg total) by mouth daily. 90 tablet 1  . Calcium Carbonate-Vit D-Min (CALCIUM 1200) 1200-1000 MG-UNIT CHEW Chew 1,200 mg by mouth daily. 30 tablet 1  . cloNIDine (CATAPRES) 0.3 MG tablet TAKE 1 TABLET BY MOUTH EVERYDAY AT BEDTIME 90 tablet 0  . Cyanocobalamin 2500 MCG TABS Take 2,500 mcg by mouth daily. 30 tablet 0  . docusate sodium (COLACE) 100 MG capsule Take 1 capsule (100 mg total) by mouth 2 (two) times daily. 10 capsule 0  . dorzolamide-timolol (COSOPT) 22.3-6.8 MG/ML ophthalmic solution Place 1 drop into the right eye 2 (two) times daily.     . Multiple Vitamins-Minerals (CENTRUM SILVER ULTRA WOMENS) TABS Take 1 tablet by mouth daily.     . pantoprazole (PROTONIX) 40 MG tablet Take 1 tablet (40 mg total) by mouth daily. 30 tablet 0  . pravastatin (PRAVACHOL) 10 MG tablet TAKE 1 TABLET BY MOUTH DAILY *DOSE REDUCTION* 90 tablet 1  . acetaminophen (TYLENOL) 325 MG tablet Take 2 tablets (650 mg total) by mouth every 4 (four) hours as needed for mild pain (temp > 100.5). (Patient not taking: Reported on 12/15/2019)    . HYDROcodone-acetaminophen (NORCO/VICODIN) 5-325 MG tablet Take 1 tablet by mouth every 4 (four) hours as needed for moderate pain. (Patient not taking: Reported on 12/15/2019) 30 tablet 0   No current facility-administered medications for this encounter.     REVIEW OF SYSTEMS: On review of systems, the patient reports that  she is doing well overall. She reports her movement and gait have improved. Her family feels like she is back to baseline. No headaches are noted. She feels that her incision site is well healed. No other complaints are noted.     PHYSICAL EXAM:  Wt Readings from Last 3 Encounters:  12/16/19 149 lb (67.6 kg)  12/15/19 149 lb 3.2 oz (67.7 kg)  12/02/19 162 lb 11.2 oz (73.8 kg)   Unable to assess due to encounter type.   ECOG = 1  0 - Asymptomatic (Fully active, able to carry on all predisease activities without restriction)  1 - Symptomatic but completely ambulatory (Restricted in physically strenuous activity but ambulatory and able to carry out work of a light or sedentary nature. For example, light housework, office work)  2 - Symptomatic, <50% in bed during the day (Ambulatory and capable of all self care but unable to carry out any work activities. Up and about more than 50% of waking hours)  3 - Symptomatic, >50% in bed,  but not bedbound (Capable of only limited self-care, confined to bed or chair 50% or more of waking hours)  4 - Bedbound (Completely disabled. Cannot carry on any self-care. Totally confined to bed or chair)  5 - Death   Eustace Pen MM, Creech RH, Tormey DC, et al. [phone]). "Toxicity and response criteria of the [facility]". [place] Oncol. 5 (6): 649-55    LABORATORY DATA:  Lab Results  Component Value Date   WBC 10.2 12/08/2019   HGB 13.3 12/08/2019   HCT 40.4 12/08/2019   MCV 86.5 12/08/2019   PLT 267 12/08/2019   Lab Results  Component Value Date   NA 140 12/08/2019   K 4.5 12/08/2019   CL 104 12/08/2019   CO2 28 12/08/2019   Lab Results  Component Value Date   ALT 25 12/08/2019   AST 15 12/08/2019   ALKPHOS 36 (L) 12/08/2019   BILITOT 0.4 12/08/2019      RADIOGRAPHY: DG Chest 2 View  Result Date: 11/22/2019 CLINICAL DATA:  Encephalopathy.  Newly discovered brain mass. EXAM: CHEST - 2 VIEW COMPARISON:  None.  FINDINGS: The heart size and mediastinal contours are within normal limits. Both lungs are clear. The visualized skeletal structures are unremarkable. IMPRESSION: No active cardiopulmonary disease. Electronically Signed   By: Ulyses Jarred M.D.   On: 11/22/2019 04:03   CT Head Wo Contrast  Result Date: 11/22/2019 CLINICAL DATA:  Altered level of consciousness for 4 days, dizziness EXAM: CT HEAD WITHOUT CONTRAST TECHNIQUE: Contiguous axial images were obtained from the base of the skull through the vertex without intravenous contrast. COMPARISON:  None. FINDINGS: Brain: There is significant vasogenic edema in the left frontal parietal region. 3.2 cm mass is seen within the left parietal white matter. MRI with and without contrast is recommended for further evaluation. Hypodensity extends along the posterior aspect of the corpus callosum, either extension of the mass or edema. I do not see any definite hemorrhage. There is significant effacement of the left lateral ventricle, with rightward midline shift measuring up to 4 mm at the septum pellucidum. There is diffuse sulcal effacement throughout the left cerebral hemisphere, greatest along the convexity. No acute extra-axial fluid collections. Vascular: No hyperdense vessel or unexpected calcification. Skull: Normal. Negative for fracture or focal lesion. Sinuses/Orbits: Small metallic densities are seen within the globes bilaterally, likely related to prior glaucoma surgery. Please correlate with surgical history. Otherwise the orbits are unremarkable. Other: None. IMPRESSION: 1. 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 MRI with and without contrast is recommended. 2. Extensive vasogenic edema throughout the left cerebral hemisphere, with significant sulcal effacement and minimal rightward midline shift. Significant effacement of the left  lateral ventricle. These results were called by telephone at the time of interpretation on 11/22/2019 at 12:34 am to provider IVA KNAPP , who verbally acknowledged these results. 1. Extensive cerebral edema Electronically Signed   By: Randa Ngo M.D.   On: 11/22/2019 00:34   CT CHEST W CONTRAST  Result Date: 11/23/2019 CLINICAL DATA:  69 y.o. female was brought to the ED due to confusion and gait imbalance. As per patient daughter, patient was having intermittent confusion. Patient denies chest pain, shortness of breath, nausea, vomiting or diarrhea. Mass seen on CT Head and Mri. EXAM: CT CHEST, ABDOMEN, AND PELVIS WITH CONTRAST TECHNIQUE: Multidetector CT imaging of the chest, abdomen and pelvis was performed following  the standard protocol during bolus administration of intravenous contrast. CONTRAST:  137m OMNIPAQUE IOHEXOL 300 MG/ML  SOLN COMPARISON:  Head CT and brain MRI from 11/22/2019 FINDINGS: CT CHEST FINDINGS Cardiovascular: Heart is normal in size and configuration. No pericardial effusion. No coronary artery calcifications. Great vessels are normal in caliber. No aortic atherosclerosis. Mediastinum/Nodes: No enlarged mediastinal, hilar, or axillary lymph nodes. Thyroid gland, trachea, and esophagus demonstrate no significant findings. Lungs/Pleura: Minor scarring at the base of the left upper lobe lingula. Minimal linear dependent lower lobe opacities consistent with atelectasis. Lungs otherwise clear with no masses or nodules. No pleural effusion or pneumothorax. Musculoskeletal: No fracture or acute finding. No osteoblastic or osteolytic lesions. CT ABDOMEN PELVIS FINDINGS Hepatobiliary: Liver normal in size and overall attenuation. Small low-density lesion between the anterior segment the right lobe and medial segment of the left, consistent with a cyst. Small amount of focal fat adjacent to the falciform ligament. No other abnormalities. Normal gallbladder. No bile duct dilation. Pancreas:  Unremarkable. No pancreatic ductal dilatation or surrounding inflammatory changes. Spleen: Normal in size without focal abnormality. Adrenals/Urinary Tract: No adrenal masses. Kidneys normal in size, orientation and position with symmetric enhancement and excretion. Bilateral low-attenuation renal masses consistent with cysts, largest from the lower pole of the left kidney, 4.5 cm. No renal stones. No hydronephrosis. Normal ureters. Normal bladder. Stomach/Bowel: Small hiatal hernia. Stomach is otherwise within normal limits. Appendix appears normal. No evidence of bowel wall thickening, distention, or inflammatory changes. Vascular/Lymphatic: No significant vascular abnormality. No enlarged lymph nodes. Reproductive: Uterus and bilateral adnexa are unremarkable. Other: No abdominal wall hernia or abnormality. No abdominopelvic ascites. Musculoskeletal: No fracture or acute finding. No osteoblastic or osteolytic lesions. IMPRESSION: 1. No acute findings within the chest, abdomen or pelvis. 2. No evidence of a primary malignancy within the chest, abdomen or pelvis. 3. Small low-density liver lesion consistent with a cyst. Bilateral renal cysts. Electronically Signed   By: DLajean ManesM.D.   On: 11/23/2019 14:38   MR BRAIN W WO CONTRAST  Result Date: 11/27/2019 CLINICAL DATA:  Postop day 1 following resection of a left parietal tumor (likely high-grade glioma). EXAM: MRI HEAD WITHOUT AND WITH CONTRAST TECHNIQUE: Multiplanar, multiecho pulse sequences of the brain and surrounding structures were obtained without and with intravenous contrast. CONTRAST:  716mGADAVIST GADOBUTROL 1 MMOL/ML IV SOLN COMPARISON:  11/22/2019 FINDINGS: Brain: Sequelae of interval left parietal craniotomy and tumor resection are identified. There is a resection cavity in the left parietal lobe and splenium of the corpus callosum with a small amount of blood products. A small amount of restricted diffusion is noted along the margins of the  resection cavity without a sizable acute infarct. There is a small amount of enhancement along the margins of the resection cavity which is largely curvilinear and likely postoperative. There is more nodular enhancement at the anterior aspect of the resection site at the level of the atrium of the left lateral ventricle which is felt to at least partially reflect choroid plexus. There is moderate nonenhancing T2 hyperintensity/edema in the white matter of the left parietal, occipital, and posterior temporal lobes which has decreased from the prior study. There is persistent nonenhancing T2 hyperintensity which crosses the splenium of the corpus callosum into the right periatrial white matter. There is trace residual rightward midline shift, decreased from prior. There is small volume pneumocephalus, and there is a small extra-axial fluid collection subjacent to the craniotomy. Mild dural thickening and enhancement over the posterior left cerebral  convexity is likely postoperative. Vascular: Major intracranial vascular flow voids are preserved. Skull and upper cervical spine: Left parietal craniotomy with small volume fluid and gas in the scalp soft tissues. Sinuses/Orbits: Left cataract extraction. Paranasal sinuses and mastoid air cells are clear. Other: None. IMPRESSION: 1. Postoperative changes from interval left parietal and splenial tumor resection. Mild enhancement along the margins of the resection cavity as above. This will serve as a baseline for follow-up examinations. 2. Decreased surrounding T2 hyperintensity compatible with decreased edema and likely underlying nonenhancing tumor. Electronically Signed   By: Logan Bores M.D.   On: 11/27/2019 13:58   MR BRAIN W WO CONTRAST  Result Date: 11/23/2019 CLINICAL DATA:  Intracranial mass EXAM: MRI HEAD WITHOUT AND WITH CONTRAST TECHNIQUE: Multiplanar, multiecho pulse sequences of the brain and surrounding structures were obtained without and with  intravenous contrast. CONTRAST:  27m GADAVIST GADOBUTROL 1 MMOL/ML IV SOLN COMPARISON:  None. FINDINGS: Brain: There is a peripherally and heterogeneously contrast-enhancing mass within the left parietal white matter extending along the corpus callosum splenium. Mass measures 4.8 x 3.3 x 3.0 cm. There is moderate surrounding hyperintense T2-weighted signal a crosses into the right hemisphere. Rightward midline shift measures 3 mm. There is petechial hemorrhage within the mass. Vascular: Normal flow voids. Skull and upper cervical spine: Normal marrow signal. Sinuses/Orbits: Negative. Other: None. IMPRESSION: 1. Heterogeneously contrast-enhancing mass within the left parietal white matter extending along the corpus callosum splenium, most consistent with high-grade glioma. 2. Moderate surrounding hyperintense T2-weighted signal, likely a combination of edema and nonenhancing tumor. 3. 3 mm of rightward midline shift. 4. Intratumoral petechial hemorrhage. Electronically Signed   By: KUlyses JarredM.D.   On: 11/23/2019 00:09   UKoreaIntraoperative  Result Date: 11/26/2019 CLINICAL DATA:  Ultrasound was provided for use by the ordering physician, and a technical charge was applied by the performing facility.  No radiologist interpretation/professional services rendered.   CT ABDOMEN PELVIS W CONTRAST  Result Date: 11/23/2019 CLINICAL DATA:  69y.o. female was brought to the ED due to confusion and gait imbalance. As per patient daughter, patient was having intermittent confusion. Patient denies chest pain, shortness of breath, nausea, vomiting or diarrhea. Mass seen on CT Head and Mri. EXAM: CT CHEST, ABDOMEN, AND PELVIS WITH CONTRAST TECHNIQUE: Multidetector CT imaging of the chest, abdomen and pelvis was performed following the standard protocol during bolus administration of intravenous contrast. CONTRAST:  1029mOMNIPAQUE IOHEXOL 300 MG/ML  SOLN COMPARISON:  Head CT and brain MRI from 11/22/2019 FINDINGS: CT  CHEST FINDINGS Cardiovascular: Heart is normal in size and configuration. No pericardial effusion. No coronary artery calcifications. Great vessels are normal in caliber. No aortic atherosclerosis. Mediastinum/Nodes: No enlarged mediastinal, hilar, or axillary lymph nodes. Thyroid gland, trachea, and esophagus demonstrate no significant findings. Lungs/Pleura: Minor scarring at the base of the left upper lobe lingula. Minimal linear dependent lower lobe opacities consistent with atelectasis. Lungs otherwise clear with no masses or nodules. No pleural effusion or pneumothorax. Musculoskeletal: No fracture or acute finding. No osteoblastic or osteolytic lesions. CT ABDOMEN PELVIS FINDINGS Hepatobiliary: Liver normal in size and overall attenuation. Small low-density lesion between the anterior segment the right lobe and medial segment of the left, consistent with a cyst. Small amount of focal fat adjacent to the falciform ligament. No other abnormalities. Normal gallbladder. No bile duct dilation. Pancreas: Unremarkable. No pancreatic ductal dilatation or surrounding inflammatory changes. Spleen: Normal in size without focal abnormality. Adrenals/Urinary Tract: No adrenal masses. Kidneys normal in size,  orientation and position with symmetric enhancement and excretion. Bilateral low-attenuation renal masses consistent with cysts, largest from the lower pole of the left kidney, 4.5 cm. No renal stones. No hydronephrosis. Normal ureters. Normal bladder. Stomach/Bowel: Small hiatal hernia. Stomach is otherwise within normal limits. Appendix appears normal. No evidence of bowel wall thickening, distention, or inflammatory changes. Vascular/Lymphatic: No significant vascular abnormality. No enlarged lymph nodes. Reproductive: Uterus and bilateral adnexa are unremarkable. Other: No abdominal wall hernia or abnormality. No abdominopelvic ascites. Musculoskeletal: No fracture or acute finding. No osteoblastic or osteolytic  lesions. IMPRESSION: 1. No acute findings within the chest, abdomen or pelvis. 2. No evidence of a primary malignancy within the chest, abdomen or pelvis. 3. Small low-density liver lesion consistent with a cyst. Bilateral renal cysts. Electronically Signed   By: Lajean Manes M.D.   On: 11/23/2019 14:38       IMPRESSION/PLAN: 1. Glioblastoma of the left parietal lobe. Dr. Lisbeth Renshaw discusses the pathology findings and reviews the nature of primary brain disease. He reviews the rationale for chemoRT to her surgical resection site. We discussed the risks, benefits, short, and long term effects of radiotherapy, and the patient is interested in proceeding. Dr. Lisbeth Renshaw discusses the delivery and logistics of radiotherapy and anticipates a course of 6 weeks of radiotherapy. She will come in tomorrow for simulation at 11 am at which time she will sign written consent to proceed.    Given current concerns for patient exposure during the COVID-19 pandemic, this encounter was conducted via telephone.  The patient has provided two factor identification and has given verbal consent for this type of encounter and has been advised to only accept a meeting of this type in a secure network environment. The time spent during this encounter was 60 minutes including preparation, discussion, and coordination of the patient's care. The attendants for this meeting include Blenda Nicely, RN, Dr. Lisbeth Renshaw, Hayden Pedro  and [name], [place], and [name] Shiprock. During the encounter,  Blenda Nicely, RN, Dr. Lisbeth Renshaw, and Hayden Pedro were located at [facility] Radiation Oncology Department.  [name] was located at home with Annette Stable, and [name] Duck was located separately at home.   The above documentation reflects my direct findings during this shared patient visit. Please see the separate note by Dr. Lisbeth Renshaw on this date for the remainder of the patient's plan of  care.    Carola Rhine, PAC

## 2019-12-17 ENCOUNTER — Other Ambulatory Visit: Payer: Self-pay

## 2019-12-17 ENCOUNTER — Ambulatory Visit (HOSPITAL_COMMUNITY): Payer: Medicare PPO | Attending: Physician Assistant | Admitting: Physical Therapy

## 2019-12-17 ENCOUNTER — Inpatient Hospital Stay: Payer: Medicare PPO

## 2019-12-17 ENCOUNTER — Ambulatory Visit
Admission: RE | Admit: 2019-12-17 | Discharge: 2019-12-17 | Disposition: A | Discharge status: Home / Self Care | Payer: Medicare PPO | Source: Ambulatory Visit | Attending: Radiation Oncology | Admitting: Radiation Oncology

## 2019-12-17 DIAGNOSIS — R41841 Cognitive communication deficit: Secondary | ICD-10-CM | POA: Insufficient documentation

## 2019-12-17 DIAGNOSIS — G9389 Other specified disorders of brain: Secondary | ICD-10-CM | POA: Diagnosis not present

## 2019-12-17 DIAGNOSIS — R29898 Other symptoms and signs involving the musculoskeletal system: Secondary | ICD-10-CM | POA: Diagnosis not present

## 2019-12-17 DIAGNOSIS — C713 Malignant neoplasm of parietal lobe: Secondary | ICD-10-CM | POA: Diagnosis not present

## 2019-12-17 DIAGNOSIS — M6281 Muscle weakness (generalized): Secondary | ICD-10-CM

## 2019-12-17 DIAGNOSIS — C719 Malignant neoplasm of brain, unspecified: Secondary | ICD-10-CM

## 2019-12-17 NOTE — Therapy (Signed)
[facility] [address] [place], [place], [address] Phone: [phone]   Fax:  [phone]  Physical Therapy Evaluation  Patient Details  Name: Wanda Ross MRN: [id number] Date of Birth: 1950/10/03 Referring Provider (PT): Glioma    Encounter Date: 12/17/2019   PT End of Session - 12/17/19 0905    Visit Number 1    Number of Visits 1    Authorization Type human[employer] medicare    PT Start Time 0830    PT Stop Time 0900    PT Time Calculation (min) 30 min    Equipment Utilized During Treatment Gait belt    Activity Tolerance Patient tolerated treatment well    Behavior During Therapy [facility] for tasks assessed/performed           Past Medical History:  Diagnosis Date  . Glaucoma   . Hyperlipidemia 2010  . Hypertension 1990  . IGT (impaired glucose tolerance) 05/11/2010  . Multinodular goiter 2010   normal function    Past Surgical History:  Procedure Laterality Date  . APPLICATION OF CRANIAL NAVIGATION Left 11/26/2019   Procedure: APPLICATION OF CRANIAL NAVIGATION;  Surgeon: [name], MD;  Location: [place];  Service: Neurosurgery;  Laterality: Left;  APPLICATION OF CRANIAL NAVIGATION  . COLONOSCOPY N/A 06/16/2016   Procedure: COLONOSCOPY;  Surgeon: Danie Binder, MD;  [facility];  Service: Endoscopy;  Laterality: N/A;  9:30 AM  . COLONOSCOPY N/A 10/22/2019   Procedure: COLONOSCOPY;  Surgeon: Daneil Dolin, MD;  [facility];  Service: Endoscopy;  Laterality: N/A;  9:30  . CRANIOTOMY Left 11/26/2019   Procedure: LEFT PARIETAL CRANIOTOMY FOR RESECTION OF TUMOR;  Surgeon: [name], MD;  Location: [place];  Service: Neurosurgery;  Laterality: Left;  LEFT PARIETAL CRANIOTOMY FOR RESECTION OF TUMOR  . EYE SURGERY  2017   3 on left eye 1 on right eye for glaucoma by Dr. Venetia Maxon   . EYE SURGERY  2018   Cataract Surgery by Dr. Arlina Robes   . OPERATIVE ULTRASOUND N/A 11/26/2019   Procedure: OPERATIVE  ULTRASOUND;  Surgeon: [name], MD;  Location: [place];  Service: Neurosurgery;  Laterality: N/A;  OPERATIVE ULTRASOUND    There were no vitals filed for this visit.    Subjective Assessment - 12/17/19 0904    Subjective Patietn reports she was hospitalized for brain surgery and had decrease in strength, walking and balance. Reports she went to inpatient rehab for a week and then went home. Reports her sister goes to her house in the morning until her son gets off from work and he stays with her until the next morning. States that she is completely independent and they are just nearby when she showers and goes down stairs. Reports in the last week see feels she has gained back a lot of strength and function and there is no functional task she cannot due. Reports she is scheduled to start radiation therapy soon.    Currently in Pain? No/denies              [facility] PT Assessment - 12/17/19 0001      Assessment   Medical Diagnosis Lauraine Rinne PA    Referring Provider (PT) Glioma     Onset Date/Surgical Date 11/26/19    Prior Therapy yes in inpatient rehab      Balance Screen   Has the patient fallen in the past 6 months No  Home Environment   Living Environment Private residence    Living Arrangements Other relatives;Alone    Available Help at Discharge Family    Type of [place] to enter    [place] One level      Prior Function   Level of Independence Independent      Cognition   Overall Cognitive Status Within Functional Limits for tasks assessed      ROM / Strength   AROM / PROM / Strength AROM;Strength      Strength   Strength Assessment Site Hip    Right/Left Hip Left;Right      Transfers   Five time sit to stand comments  13.3      Ambulation/Gait   Ambulation/Gait Yes    Ambulation/Gait Assistance 7: Independent    Ambulation Distance (Feet) 330 Feet    Assistive device None    Ambulation Surface Level;Indoor     Stairs Yes    Stairs Assistance 7: Independent    Stair Management Technique One rail Left;Alternating pattern    Number of Stairs 4    Height of Stairs 7    Gait Comments 2MW      Balance   Balance Assessed Yes      Static Standing Balance   Static Standing - Level of Assistance 7: Independent    Static Standing Balance -  Activities  Single Leg Stance - Right Leg;Single Leg Stance - Left Leg;Tandam Stance - Right Leg;Tandam Stance - Left Leg    Static Standing - Comment/# of Minutes SLS - 30 sec with 1 UE support B; tandem no UE support 30" B                       Objective measurements completed on examination: See above findings.       [facility] Adult PT Treatment/Exercise - 12/17/19 0001      Exercises   Exercises Knee/Hip      Knee/Hip Exercises: Standing   Other Standing Knee Exercises tandem B - 2x30" holds, SLS with one UE support 2x30" B       Knee/Hip Exercises: Seated   Sit to Sand 20 reps;without UE support                  PT Education - 12/17/19 0909    Education Details Educated patient and sister in basic HEP, current functional status, focus and home and what to do if she decides she needs therapy in future.    Person(s) Educated Patient;Other (comment)    Methods Explanation    Comprehension Verbalized understanding            PT Short Term Goals - 12/17/19 0905      PT SHORT TERM GOAL #1   Title Patient will be independent in HEP to improve functional outcomes.    Time 1    Period Weeks    Status Achieved    Target Date 12/24/19                     Plan - 12/17/19 0906    Clinical Impression Statement Patient is s/p craniotomy with resection of left parietal mass on 11/26/19. She has made great progress since her surgery and is completely independent at this time with no functional needs noted during today's evaluation. Answered all questions and patient and sister agree that they do not feel they need physical  therapy at  this time. Provided patient with basic HEP to work on to continue to improve strength and balance at home. Patient will not need physical therapy at this time based on today's functional presentation.    Stability/Clinical Decision Making Stable/Uncomplicated    Clinical Decision Making Low    Rehab Potential Excellent    PT Frequency One time visit    PT Treatment/Interventions ADLs/Self Care Home Management;Therapeutic exercise    PT Next Visit Plan one time visit    PT Home Exercise Plan STS, tandem and SLS    Consulted and Agree with Plan of Care Patient;Family member/caregiver    Family Member Consulted sister           Patient will benefit from skilled therapeutic intervention in order to improve the following deficits and impairments:     Visit Diagnosis: Muscle weakness (generalized)  Brain mass     Problem List Patient Active Problem List   Diagnosis Date Noted  . Goals of care, counseling/discussion 12/15/2019  . 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 Hypoalbuminemia due to protein-calorie malnutrition ([place])   . Transaminitis   . Benign essential HTN   . Prediabetes   . Seizure prophylaxis   . Glioma ([place]) 12/02/2019  . Right sided weakness   . Dyslipidemia   . Essential hypertension   . Steroid-induced hyperglycemia   . Leucocytosis   . Brain mass 11/22/2019  . 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 Abnormal electrocardiogram (ECG) (EKG) 12/31/2017  . White coat syndrome with diagnosis of hypertension 12/31/2017  . Glaucoma 12/26/2013  . Vitamin D deficiency 05/27/2009  . IMPAIRED GLUCOSE TOLERANCE 05/27/2009  . OSTEOPENIA 05/27/2009  . Multiple thyroid nodules 11/06/2007  . Hyperlipidemia 11/06/2007  . Malignant hypertension 11/06/2007   9:13 AM, 12/17/19 Jerene Pitch, DPT Physical Therapy with [facility]  [phone] office  [place] [facility] [address] [place], [place], [address] Phone:  [phone]   Fax:  [phone]  Name: Wanda Ross MRN: [id number] Date of Birth: 1951/01/12

## 2019-12-17 NOTE — Research (Signed)
GB2010 - TESTING RAMIPRIL TO PREVENT MEMORY LOSS IN PEOPLE WITH GLIOBLASTOMA  DCP-001 USE OF A CLINICAL TRIAL SCREENING TOOL TO ADDRESS CANCER HEALTH DISPARITIES IN THE NCI COMMUNITY ONCOLOGY RESEARCH PROGRAM (NCORP)  12/17/2019 11:55AM  OF-1219 CONSENT REVIEW: Met with patient and her sister [name] privately in the audit room of the clinical research department for 40 minutes following her [facility]  appointment to discuss potential voluntary participation in the [id number] Ramipril study. We reviewed the consent form (protocol version date 04/29/19, [place] Active Date 07/04/19) in full, including the voluntary nature of participation, the study purpose, usual approach to treatment and alternatives to study participation, risks and benefits, Ramipril schedule, lab tests and neurocognitive testing, known Ramipril side effects, costs and compensation, injury coverage and who will see the provided study information. A copy of the study medication diary was also reviewed (and a copy provided) to familiarize [name] with the form. An opportunity to ask questions was provided and all questions were answered. HIPAA form (dated 04/03/17) was also reviewed, including the data to be collected, why the information is being collected, who will see the information, and safety measures to keep information private. Upon completion of review, the patient and her sister verbalize a desire to take copies of the above forms home to discuss with the family, particularly since Spain lives in [place] and requires a family member to assist with transportation. Both ladies are encouraged to review all provided materials carefully and thoroughly but remain mindful that Ramipril administration would start the first day of chemoradiation (currently scheduled for 12/29/19) so informed consent, enrollment, and baseline activities would all require completion prior to 12/29/19: both verbalize understanding. We reviewed Braeleigh's current  schedule and three open dates for potential consenting and baseline activities were identified: these dates and times were written on the envelope containing study material copies. A business card with my direct contact information along with a [place] brochure are also provided. Both Labria and her sister are encouraged to contact me with any questions or concerns and an extra business card is provided for Shakaria's daughter (not present today), should she need assistance. Eligibility criteria have been reviewed and confirmed by myself and clinical research nurse, Otilio Miu; eligibility will be confirmed again prior to enrollment should [name] choose to voluntarily participate. Following review of the WF-1801 materials, a brief overview of DCP-001 was provided.  DCP-001 CONSENT REVIEW: I explained that since [place] is eligible for participation in the WF-1801 study, she is also eligible for DCP-001 participation. DCP-001 consent form (protocol version date 11/19/18, [place] Active Date 06/03/19) was reviewed, including the voluntary nature of participation, the project purpose, risks and benefits, and who will see the provided information. Roshawn verbalizes understanding that this study is a one-time consent for collection of demographic variables and that no patient identifiers are being reported. A copy of the NIH DCP-001 HIPPA form (dated 07/20/14) including the data to be collected, why the information is being collected, who will see the information, and safety measures to keep information private was also reviewed and copies provided. All questions were answered.    Two copies of the WF-1801 informed consent forms (along with drug diary) and two copies of the DCP-001 forms were provided to take home. Current plan is to have [place] review the provided materials and she will contact me about potential future appointments for consenting and baseline activities. Since  there are only seven business days remaining between today and chemoradiation initiation, if  I do not hear back from [place] by this Friday (12/19/2019), I will follow-up with a phone call in the event study drug needs to be ordered. Both [place] and [name] are thanked for their time and willingness to meet with me today.   Dionne Bucy. Sharlett Iles, BSN, RN, CIC 12/17/2019 1:14 PM

## 2019-12-18 ENCOUNTER — Encounter (HOSPITAL_COMMUNITY): Payer: Self-pay | Admitting: Speech Pathology

## 2019-12-18 ENCOUNTER — Ambulatory Visit (HOSPITAL_COMMUNITY): Payer: Medicare PPO | Admitting: Speech Pathology

## 2019-12-18 ENCOUNTER — Other Ambulatory Visit: Payer: Self-pay | Admitting: Internal Medicine

## 2019-12-18 ENCOUNTER — Telehealth: Payer: Self-pay | Admitting: Internal Medicine

## 2019-12-18 DIAGNOSIS — G9389 Other specified disorders of brain: Secondary | ICD-10-CM | POA: Diagnosis not present

## 2019-12-18 DIAGNOSIS — M6281 Muscle weakness (generalized): Secondary | ICD-10-CM | POA: Diagnosis not present

## 2019-12-18 DIAGNOSIS — C719 Malignant neoplasm of brain, unspecified: Secondary | ICD-10-CM

## 2019-12-18 DIAGNOSIS — R41841 Cognitive communication deficit: Secondary | ICD-10-CM | POA: Diagnosis not present

## 2019-12-18 DIAGNOSIS — R29898 Other symptoms and signs involving the musculoskeletal system: Secondary | ICD-10-CM | POA: Diagnosis not present

## 2019-12-18 LAB — SURGICAL PATHOLOGY

## 2019-12-18 MED ORDER — TEMOZOLOMIDE 20 MG PO CAPS
20.0000 mg | ORAL_CAPSULE | Freq: Every day | ORAL | 0 refills | Status: DC
Start: 2019-12-18 — End: 2019-12-23

## 2019-12-18 MED ORDER — ONDANSETRON HCL 8 MG PO TABS: 8.0000 mg | ORAL_TABLET | Freq: Two times a day (BID) | ORAL | 1 refills | Status: DC | PRN

## 2019-12-18 MED ORDER — TEMOZOLOMIDE 100 MG PO CAPS: 100.0000 mg | ORAL_CAPSULE | Freq: Every day | ORAL | 0 refills | Status: DC

## 2019-12-18 MED FILL — ONDANSETRON HCL 8 MG TABLET: 8 | 15 days supply | Qty: 30 | Fill #0

## 2019-12-18 NOTE — Therapy (Signed)
[place], [place], [address] Phone: [phone]   Fax:  [phone]  Speech Language Pathology Evaluation  Patient Details  Name: Wanda Ross MRN: [id number] Date of Birth: 1951-04-23 Referring Provider (SLP): Lauraine Rinne, PA-C   Encounter Date: 12/18/2019   End of Session - 12/18/19 0924    Visit Number 1    Authorization Type [employer]    SLP Start Time 0900    Activity Tolerance Patient tolerated treatment well           Past Medical History:  Diagnosis Date  . Glaucoma   . Hyperlipidemia 2010  . Hypertension 1990  . IGT (impaired glucose tolerance) 05/11/2010  . Multinodular goiter 2010   normal function    Past Surgical History:  Procedure Laterality Date  . APPLICATION OF CRANIAL NAVIGATION Left 11/26/2019   Procedure: APPLICATION OF CRANIAL NAVIGATION;  Surgeon: [name], MD;  Location: [place];  Service: Neurosurgery;  Laterality: Left;  APPLICATION OF CRANIAL NAVIGATION  . COLONOSCOPY N/A 06/16/2016   Procedure: COLONOSCOPY;  Surgeon: Danie Binder, MD;  [facility];  Service: Endoscopy;  Laterality: N/A;  9:30 AM  . COLONOSCOPY N/A 10/22/2019   Procedure: COLONOSCOPY;  Surgeon: Daneil Dolin, MD;  [facility];  Service: Endoscopy;  Laterality: N/A;  9:30  . CRANIOTOMY Left 11/26/2019   Procedure: LEFT PARIETAL CRANIOTOMY FOR RESECTION OF TUMOR;  Surgeon: [name], MD;  Location: [place];  Service: Neurosurgery;  Laterality: Left;  LEFT PARIETAL CRANIOTOMY FOR RESECTION OF TUMOR  . EYE SURGERY  2017   3 on left eye 1 on right eye for glaucoma by Dr. Venetia Maxon   . EYE SURGERY  2018   Cataract Surgery by Dr. Arlina Robes   . OPERATIVE ULTRASOUND N/A 11/26/2019   Procedure: OPERATIVE ULTRASOUND;  Surgeon: [name], MD;  Location: [place];  Service: Neurosurgery;  Laterality: N/A;  OPERATIVE ULTRASOUND    There were no vitals filed for this  visit.   Subjective Assessment - 12/18/19 0923    Subjective "I think I am back to normal."    Patient is accompained by: Family member    Special Tests RBANS A    Currently in Pain? No/denies              SLP Evaluation [facility] - 12/18/19 7654      SLP Visit Information   SLP Received On 12/18/19    Referring Provider (SLP) Lauraine Rinne, PA-C    Onset Date 12/02/2019    Medical Diagnosis left parietal mass      Subjective   Subjective "I think I am doing really well."    Patient/Family Stated Goal Do whatever I need to be doing      Pain Assessment   Currently in Pain? No/denies      General Information   HPI Wanda Ross is a 69 y.o. right-handed female with history of hyperlipidemia and hypertension.  Lives alone 1 level home 2 steps to entry.  Independent prior to admission.  She does have a daughter and 2 sons in the area with good support.  Presented 11/22/2019 with altered mental status and gait abnormality.  Admission chemistries with potassium 2.8, glucose 155, urinalysis negative nitrite.  CT/MRI showed contrast-enhancing mass within the left parietal white matter extending along the corpus callosum splenium most consistent with high-grade glioma.  Moderate surrounding hyperintense T2 weighted signal likely combination of edema  and nonenhancing tumor with a 3 mm rightward midline shift.  CT of the chest and abdomen pelvis showed no acute findings.  Patient underwent left craniotomy for resection parietal and splenial mass 11/26/2019 per Dr. Duffy Rhody. She attended inpatient rehab and was discharged home with a recommendation for outpatient therapies. She was referred to outpatient SLP therapy by Virl Son, PA-C.    Behavioral/Cognition alert and cooperative    Mobility Status ambulatory      Balance Screen   Has the patient fallen in the past 6 months No    Has the patient had a decrease in activity level because of a fear of falling?  No    Is the patient  reluctant to leave their home because of a fear of falling?  No      Prior Functional Status   Cognitive/Linguistic Baseline Within functional limits    Type of Home House     Lives With Alone    Available Support Family    Education masters    Vocation Retired      Pain Assessment   Pain Assessment No/denies pain      Cognition   Overall Cognitive Status Impaired/Different from baseline    Area of Impairment Memory;Attention;Following commands      Auditory Comprehension   Overall Auditory Comprehension Impaired    Yes/No Questions Within Functional Limits    Commands Impaired    Multistep Basic Commands 50-74% accurate    Complex Commands 50-74% accurate    Conversation Complex    Interfering Components Processing speed;Working Recruitment consultant Within [employer]      Reading Comprehension   Reading Status Within funtional limits      Expression   Primary Mode of Expression Verbal      Verbal Expression   Overall Verbal Expression Appears within functional limits for tasks assessed    Initiation No impairment    Automatic Speech Name;Social Response    Level of Generative/Spontaneous Verbalization Sentence    Repetition No impairment    Naming No impairment    Divergent 50-74% accurate    Pragmatics No impairment    Non-Verbal Means of Communication Not applicable      Written Expression   Dominant Hand Left    Written Expression Not tested      Oral Motor/Sensory Function   Overall Oral Motor/Sensory Function Appears within functional limits for tasks assessed      Motor Speech   Overall Motor Speech Appears within functional limits for tasks assessed    Respiration Within functional limits    Phonation Normal    Resonance Within functional limits    Articulation Within functional limitis    Intelligibility Intelligible    Motor Planning Witnin functional limits    Motor Speech Errors Not applicable     Phonation WFL      Standardized Assessments   Standardized Assessments  --   RBANS              SLP Short Term Goals - 12/18/19 0939      SLP SHORT TERM GOAL #1   Title Pt will implement memory strategies in functional therapy activities with 95% acc with min cues.    Baseline 75%    Time 4    Period Weeks    Status New    Target Date 01/22/20      SLP SHORT TERM GOAL #2   Title Pt will complete  moderate-level thought organization and planning activities with 90% acc and min assist.    Baseline 80% but needs a lot of extra time    Time 4    Period Weeks    Status New    Target Date 01/22/20      SLP SHORT TERM GOAL #3   Title Pt will increase recall for requested information to 95% acc during functional memory exercises with use of compensatory strategies as needed when provided min cues.    Baseline 80%    Time 4    Period Weeks    Status New    Target Date 01/22/20      SLP SHORT TERM GOAL #4   Title Pt will complete moderately complex visual scanning activities during pencil/paper tasks with 90% acc with min assist and use of strategies as needed.    Baseline 70%    Time 4    Period Weeks    Status New    Target Date 01/22/20            SLP Long Term Goals - 12/18/19 0947      SLP LONG TERM GOAL #1   Title Same as short term goals    Target Date 01/22/20              Plan - 12/18/19 5465    Clinical Impression Statement Pt presents with mild cognitive linguistic deficits characterized by impairments in auditory comprehension, complex planning and problem solving, short term memory, and visual attention. Pt will benefit from skilled SLP in order to address the above impairments, maximize independence, and decrease burden of care. She is highly motivated to return to independence and has excellent family support.    Speech Therapy Frequency 2x / week    Duration 4 weeks    Potential to Achieve Goals Good    SLP Home Exercise Plan Pt will completed  HEP as assigned to facilitate carryover of treatment strategies and techniques in home environment.    Consulted and Agree with Plan of Care Patient           Patient will benefit from skilled therapeutic intervention in order to improve the following deficits and impairments:   Cognitive communication deficit    Problem List Patient Active Problem List   Diagnosis Date Noted  . Goals of care, counseling/discussion 12/15/2019  . 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 Hypoalbuminemia due to protein-calorie malnutrition ([place])   . Transaminitis   . Benign essential HTN   . Prediabetes   . Seizure prophylaxis   . Glioma ([place]) 12/02/2019  . Right sided weakness   . Dyslipidemia   . Essential hypertension   . Steroid-induced hyperglycemia   . Leucocytosis   . Brain mass 11/22/2019  . 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 Abnormal electrocardiogram (ECG) (EKG) 12/31/2017  . White coat syndrome with diagnosis of hypertension 12/31/2017  . Glaucoma 12/26/2013  . Vitamin D deficiency 05/27/2009  . IMPAIRED GLUCOSE TOLERANCE 05/27/2009  . OSTEOPENIA 05/27/2009  . Multiple thyroid nodules 11/06/2007  . Hyperlipidemia 11/06/2007  . Malignant hypertension 11/06/2007   Thank you,  Genene Churn, [place]  [facility] 12/18/2019, 5:17 PM  [facility] [address] [place], [place], [address] Phone: [phone]   Fax:  [phone]  Name: Wanda Ross MRN: [id number] Date of Birth: 1950-10-22

## 2019-12-18 NOTE — Progress Notes (Signed)
START ON PATHWAY REGIMEN - Neuro     One cycle, concurrent with RT:     Temozolomide   **Always confirm dose/schedule in your pharmacy ordering system**  Patient Characteristics: Glioblastoma (Grade IV Glioma), Newly Diagnosed / Treatment Naive, Good Performance Status and/or Younger Patient, MGMT Promoter Unmethylated/Unknown Disease Classification: Glioma Disease Classification: Glioblastoma (Grade IV Glioma) Disease Status: Newly Diagnosed / Treatment Naive Performance Status: Good Performance Status and/or Younger Patient MGMT Promoter Methylation Status: Awaiting Test Results Intent of Therapy: Non-Curative / Palliative Intent, Discussed with Patient 

## 2019-12-18 NOTE — Telephone Encounter (Signed)
Scheduled appt per 7/22 sch msg - pt sister is aware.

## 2019-12-19 ENCOUNTER — Telehealth: Payer: Self-pay

## 2019-12-19 ENCOUNTER — Other Ambulatory Visit: Payer: Self-pay

## 2019-12-19 ENCOUNTER — Encounter (HOSPITAL_COMMUNITY): Payer: Self-pay | Admitting: Specialist

## 2019-12-19 ENCOUNTER — Ambulatory Visit (HOSPITAL_COMMUNITY): Payer: Medicare PPO | Admitting: Specialist

## 2019-12-19 DIAGNOSIS — M6281 Muscle weakness (generalized): Secondary | ICD-10-CM | POA: Diagnosis not present

## 2019-12-19 DIAGNOSIS — R29898 Other symptoms and signs involving the musculoskeletal system: Secondary | ICD-10-CM | POA: Diagnosis not present

## 2019-12-19 DIAGNOSIS — G9389 Other specified disorders of brain: Secondary | ICD-10-CM | POA: Diagnosis not present

## 2019-12-19 DIAGNOSIS — C719 Malignant neoplasm of brain, unspecified: Secondary | ICD-10-CM

## 2019-12-19 DIAGNOSIS — R41841 Cognitive communication deficit: Secondary | ICD-10-CM | POA: Diagnosis not present

## 2019-12-19 NOTE — Patient Instructions (Signed)
Home Exercises Program Theraputty Exercises  Do the following exercises 2-3 times a day using your left hand.  1. Roll putty into a ball.  2. Make into a pancake.  3. Roll putty into a roll.  4. Pinch along log with first finger and thumb.   5. Make into a ball.  6. Roll it back into a log.   7. Pinch using thumb and side of first finger.  8. Roll into a ball, then flatten into a pancake.  9. Using your fingers, make putty into a mountain.

## 2019-12-19 NOTE — Telephone Encounter (Signed)
Oral Oncology Patient Advocate Encounter  Prior Authorization for Temozolomide has been approved.    PA#   Effective dates: 12/19/19 through 12/18/20  Patients co-pay is $50/each  Oral Oncology Clinic will continue to follow.   [place] Patient [place] Phone [phone] Fax [phone] 12/19/2019 2:08 PM

## 2019-12-19 NOTE — Therapy (Signed)
[place], [place], [address] Phone: [phone]   Fax:  [phone]  Occupational Therapy Evaluation  Patient Details  Name: Wanda Ross MRN: [id number] Date of Birth: [birth year] Referring Provider (OT): Jethro Bolus, [place]   Encounter Date: 12/19/2019   OT End of Session - 12/19/19 1052    Visit Number 1    Number of Visits 1    Date for OT Re-Evaluation 12/19/19    Authorization Type [employer] - Eval only charged, no auth required    OT Start Time 0940    OT Stop Time 1015    OT Time Calculation (min) 35 min    Activity Tolerance Patient tolerated treatment well    Behavior During Therapy [facility] for tasks assessed/performed           Past Medical History:  Diagnosis Date  . Glaucoma   . Hyperlipidemia 2010  . Hypertension 1990  . IGT (impaired glucose tolerance) 05/11/2010  . Multinodular goiter 2010   normal function    Past Surgical History:  Procedure Laterality Date  . APPLICATION OF CRANIAL NAVIGATION Left 11/26/2019   Procedure: APPLICATION OF CRANIAL NAVIGATION;  Surgeon: [name], MD;  Location: [place];  Service: Neurosurgery;  Laterality: Left;  APPLICATION OF CRANIAL NAVIGATION  . COLONOSCOPY N/A 06/16/2016   Procedure: COLONOSCOPY;  Surgeon: Danie Binder, MD;  [facility];  Service: Endoscopy;  Laterality: N/A;  9:30 AM  . COLONOSCOPY N/A 10/22/2019   Procedure: COLONOSCOPY;  Surgeon: Daneil Dolin, MD;  [facility];  Service: Endoscopy;  Laterality: N/A;  9:30  . CRANIOTOMY Left 11/26/2019   Procedure: LEFT PARIETAL CRANIOTOMY FOR RESECTION OF TUMOR;  Surgeon: [name], MD;  Location: [place];  Service: Neurosurgery;  Laterality: Left;  LEFT PARIETAL CRANIOTOMY FOR RESECTION OF TUMOR  . EYE SURGERY  2017   3 on left eye 1 on right eye for glaucoma by Dr. Venetia Maxon   . EYE SURGERY  2018   Cataract Surgery by Dr. Arlina Robes   . OPERATIVE  ULTRASOUND N/A 11/26/2019   Procedure: OPERATIVE ULTRASOUND;  Surgeon: [name], MD;  Location: [place];  Service: Neurosurgery;  Laterality: N/A;  OPERATIVE ULTRASOUND    There were no vitals filed for this visit.   Subjective Assessment - 12/19/19 1049    Subjective  S:  I really enjoy gardening and would like to get back to doing so.    Pertinent History Wanda Ross is a 69 y.o. left-handed female with history of hyperlipidemia and hypertension.  Lives alone 1 level home 2 steps to entry.  Independent prior to admission.  She does have a daughter and 2 sons in the area with good support.  Presented 11/22/2019 with altered mental status and gait abnormality.  Admission chemistries with potassium 2.8, glucose 155, urinalysis negative nitrite.  CT/MRI showed contrast-enhancing mass within the left parietal white matter extending along the corpus callosum splenium most consistent with high-grade glioma.  Moderate surrounding hyperintense T2 weighted signal likely combination of edema and nonenhancing tumor with a 3 mm rightward midline shift.  CT of the chest and abdomen pelvis showed no acute findings.  Patient underwent left craniotomy for resection parietal and splenial mass 11/26/2019 per Dr. Duffy Rhody. She attended inpatient rehab and was discharged home with a recommendation for outpatient therapies. She was referred to outpatient occupational therapy by Virl Son, PA-C.    Patient Stated Goals  I want to get back to doing things for myself    Currently in Pain? No/denies             [facility] OT Assessment - 12/19/19 0001      Assessment   Medical Diagnosis Craniotomy s/p Glioma    Referring Provider (OT) Jethro Bolus, PA    Onset Date/Surgical Date 11/26/19    Hand Dominance Left    Prior Therapy CIR for PT, OT, ST       Precautions   Precautions Fall      Restrictions   Weight Bearing Restrictions No      Balance Screen   Has the patient fallen in the past  6 months No    Has the patient had a decrease in activity level because of a fear of falling?  No    Is the patient reluctant to leave their home because of a fear of falling?  No      Home  Environment   Family/patient expects to be discharged to: Private residence    Living Arrangements Alone    Available Help at Discharge Family      Prior Function   Level of Independence Independent    Vocation Retired    Biomedical scientist retired principal    Leisure enjoys gardening, yard work      ADL   ADL comments reports independence with BADLs, desires to return to participation of [place] and leisure tasks with supervision initially from son       Written Expression   Dominant Hand Left    Handwriting 100% legible      Vision - History   Baseline Vision Wears glasses for distance only    Patient Visual Report --   WNL     Cognition   Overall Cognitive Status --   defer to ST eval      Sensation   Light Touch Appears Intact      Coordination   9 Hole Peg Test Right;Left    Right 9 Hole Peg Test 38.16"    Left 9 Hole Peg Test 29.76"      ROM / Strength   AROM / PROM / Strength AROM;Strength      AROM   Overall AROM Comments BUE A/ROM is WNL      Strength   Overall Strength Comments BUE strength is 5/5      Hand Function   Right Hand Grip (lbs) 95    Right Hand Lateral Pinch 12 lbs    Right Hand 3 Point Pinch 12 lbs    Left Hand Grip (lbs) 80    Left Hand Lateral Pinch 16 lbs    Left 3 point pinch 18 lbs                           OT Education - 12/19/19 1051    Education Details edcuated patient on left hand strengthening with pink theraputty    Person(s) Educated Patient    Methods Explanation;Demonstration;Handout    Comprehension Verbalized understanding;Returned demonstration            OT Short Term Goals - 12/19/19 1058      OT SHORT TERM GOAL #1   Title Patient will be educated and independent with HEP for left hand strengthening.     Time 1    Period Days    Status Achieved    Target Date 12/19/19  Plan - 12/19/19 1053    Clinical Impression Statement A:  Wanda Ross is a 69 year old female that is s/p craniotomy for glioma removal in June.  Patient recieved PT, OT, ST in CIR and was dc home on 12/10/19.  She was referred to OT for evaluation and treatment.  Upon evaluation, patient has WNL A/ROM and strength, pinch strength, coordination.  Left grip strength is WFL, however, she is left hand dominant and her left grip strength is weaker than her right.  Therefore, patient given HEP for left hand strengthening.  Patient denies any issues with vision, sensation.  OT does not recommend any follow up OT services and recommends patient slowly and safely return to desired IADLS and leisure activities with initial [place] by son.  Discussed plan of care with patient and her son and all are in agreeance.    OT Occupational Profile and History Problem Focused Assessment - Including review of records relating to presenting problem    Occupational performance deficits (Please refer to evaluation for details): IADL's    Body Structure / Function / Physical Skills Strength    Rehab Potential Good    Clinical Decision Making Limited treatment options, no task modification necessary    Comorbidities Affecting Occupational Performance: None    Modification or Assistance to Complete Evaluation  No modification of tasks or assist necessary to complete eval    OT Frequency One time visit    OT Treatment/Interventions Patient/family education;Therapeutic exercise    Plan P: DC from skilled OT intervention this date with HEP for continued improvement of left hand strength.    OT Home Exercise Plan left hand strengthening with pink theraputty.           Patient will benefit from skilled therapeutic intervention in order to improve the following deficits and impairments:   Body Structure / Function / Physical  Skills: Strength       Visit Diagnosis: Other symptoms and signs involving the musculoskeletal system - Plan: Ot plan of care cert/re-cert    Problem List Patient Active Problem List   Diagnosis Date Noted  . Goals of care, counseling/discussion 12/15/2019  . Glioblastoma ([place])   . Hypoalbuminemia due to protein-calorie malnutrition ([place])   . Transaminitis   . Benign essential HTN   . Prediabetes   . Seizure prophylaxis   . Glioma ([place]) 12/02/2019  . Right sided weakness   . Dyslipidemia   . Essential hypertension   . Steroid-induced hyperglycemia   . Leucocytosis   . Brain mass 11/22/2019  . 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 Abnormal electrocardiogram (ECG) (EKG) 12/31/2017  . White coat syndrome with diagnosis of hypertension 12/31/2017  . Glaucoma 12/26/2013  . Vitamin D deficiency 05/27/2009  . IMPAIRED GLUCOSE TOLERANCE 05/27/2009  . OSTEOPENIA 05/27/2009  . Multiple thyroid nodules 11/06/2007  . Hyperlipidemia 11/06/2007  . Malignant hypertension 11/06/2007    Vangie Bicker, [place], OTR/L [phone]  12/19/2019, 11:04 AM  [place], [place], [address] Phone: [phone]   Fax:  [phone]  Name: LIZBET CIRRINCIONE MRN: [id number] Date of Birth: [birth year]

## 2019-12-19 NOTE — Telephone Encounter (Signed)
Oral Oncology Patient Advocate Encounter  Received notification from [facility] that prior authorization for Temozolomide is required.  PA submitted on CoverMyMeds  Key 20mg  [id number]        100mg  [id number]  Status is pending  Oral Oncology Clinic will continue to follow.  [place] Patient [place] Phone [phone] Fax [phone] 12/19/2019 9:30 AM

## 2019-12-19 NOTE — Telephone Encounter (Signed)
[id number] - TESTING RAMIPRIL TO PREVENT MEMORY LOSS IN PEOPLE WITH GLIOBLASTOMA  DCP-001 USE OF A CLINICAL TRIAL SCREENING TOOL TO ADDRESS CANCER HEALTH DISPARITIES IN THE [employer] COMMUNITY ONCOLOGY RESEARCH PROGRAM ([facility])  12/19/2019 11:44AM  FOLLOW-UP TELEPHONE CALL: Outgoing call (five minutes) to the home number on record as a follow-up to the consent review meeting earlier this week regarding potential voluntary participation in the WF1801 Ramipril study and/or DCP-001. I spoke with [place] on this call and verified that I was speaking with the correct person. Shilah states that she has had an opportunity to speak with her daughter but is waiting for her oldest son to review the study materials. Maleah feels that all family members should have time to review the materials over the weekend and requests a follow-up call next week. Current plan is to have [place] contact me directly once a participation decision has been made; she verbalizes that she still has my business card with direct contact information. Lexany is advised that if I do not hear from her by noon on Tuesday (12/22/19), that I am happy to follow-up with her Tuesday afternoon and she verbalizes agreement with this plan. Berdena isthanked for her time and willingness tospeak with me today and is encouraged to call for any questions or concerns.  Dionne Bucy. Sharlett Iles, BSN, RN, CIC 12/19/2019 12:09 PM

## 2019-12-22 ENCOUNTER — Telehealth: Payer: Self-pay

## 2019-12-22 ENCOUNTER — Encounter: Payer: Self-pay | Admitting: Medical Oncology

## 2019-12-22 ENCOUNTER — Other Ambulatory Visit: Payer: Self-pay

## 2019-12-22 ENCOUNTER — Telehealth: Payer: Self-pay | Admitting: Pharmacist

## 2019-12-22 ENCOUNTER — Encounter: Payer: Medicare PPO | Attending: Physical Medicine & Rehabilitation | Admitting: Physical Medicine & Rehabilitation

## 2019-12-22 ENCOUNTER — Encounter: Payer: Self-pay | Admitting: Physical Medicine & Rehabilitation

## 2019-12-22 ENCOUNTER — Encounter (HOSPITAL_COMMUNITY): Payer: Self-pay | Admitting: Speech Pathology

## 2019-12-22 ENCOUNTER — Ambulatory Visit (HOSPITAL_COMMUNITY): Payer: Medicare PPO | Admitting: Speech Pathology

## 2019-12-22 VITALS — BP 144/86 | HR 91 | Temp 98.4°F | Ht 68.0 in | Wt 149.4 lb

## 2019-12-22 DIAGNOSIS — C719 Malignant neoplasm of brain, unspecified: Secondary | ICD-10-CM

## 2019-12-22 DIAGNOSIS — R41841 Cognitive communication deficit: Secondary | ICD-10-CM

## 2019-12-22 DIAGNOSIS — R7401 Elevation of levels of liver transaminase levels: Secondary | ICD-10-CM | POA: Diagnosis not present

## 2019-12-22 DIAGNOSIS — I10 Essential (primary) hypertension: Secondary | ICD-10-CM

## 2019-12-22 DIAGNOSIS — M6281 Muscle weakness (generalized): Secondary | ICD-10-CM | POA: Diagnosis not present

## 2019-12-22 DIAGNOSIS — R29898 Other symptoms and signs involving the musculoskeletal system: Secondary | ICD-10-CM | POA: Diagnosis not present

## 2019-12-22 DIAGNOSIS — G9389 Other specified disorders of brain: Secondary | ICD-10-CM | POA: Diagnosis not present

## 2019-12-22 NOTE — Telephone Encounter (Signed)
Scheduled per 7/26 sch message. Left voicemail with appts on 7/29.

## 2019-12-22 NOTE — Research (Signed)
[id number] - TESTING RAMIPRIL TO PREVENT MEMORY LOSS IN PEOPLE WITH GLIOBLASTOMA  DCP-001 USE OF A CLINICAL TRIAL SCREENING TOOL TO ADDRESS CANCER HEALTH DISPARITIES IN THE [employer] COMMUNITY ONCOLOGY RESEARCH PROGRAM (NCORP)  12/22/2019 10:10AM  TELEPHONE CALL: Voicemail message received from Wanda Ross (patient's daughter) requesting call back. They (Wanda Ross and Wanda Ross) are in the [place] area today and would like to stop by to ask a few questions about the WF1801 study. Returned call and advised that I am available all day today and to stop by at their convenience.  10:40AM [id number] CONSENT: Met with Wanda Ross and her daughter Wanda Ross privately in the breast clinic conference room for 30 minutes. Both Wanda Ross and Wanda Ross verbalize that the consent form(s) have been reviewed in full. An opportunity to ask questions was provided and all questions were answered. Upon close of discussion, Wanda Ross a desire to voluntarily participate in the WF1801 Ramipril study. Wanda Ross verbalizes understanding of the voluntary nature of participation, the study purpose, usual approach to treatment and alternatives to study participation, risks and benefits, Ramipril schedule, lab tests and neurocognitive testing, known Ramipril side effects, costs and compensation, coverage should injury occur and who will see the provided study information. Consent form (protocol version date 04/29/19, Wanda Ross Active Date 07/04/19) was signed by Wanda Ross and dated. HIPAA form (dated 04/03/17) was also reviewed and signed by Wanda Ross. Copies of both signed consent forms were provided. After obtaining informed consent and providing copies, the patient demographic and health behaviors form was completed by Wanda Ross per study protocol. Wanda Ross that an additional baseline visit will be required for research labs and neurocognitive testing; her son is available to bring her back to the cancer center Thursday morning to complete any  reminding study requirements. Current plan is to schedule a research lab appointment and a research appointment starting at 9:30am Thursday (12/25/19) for baseline information. A business card with my direct contact information is provided and both Wanda Ross and Wanda Ross are encouraged to contact me with any questions or concerns or if a schedule change is necessary. Eligibility criteria have been reviewed and confirmed by myself and clinical research nurse, Otilio Miu; eligibility will be confirmed again prior to enrollment. Wanda Ross has an appointment in [place] this morning and would like to defer DCP discussion until Thursday (DCP consent was not obtained today).   Current plan is to schedule a baseline visit for Thursday, 12/25/19, and dispense Ramipril on Monday, 12/29/19, on day one of chemoradiation. Wanda Ross are escorted to the cancer center main entrance and are thanked for their time and willingness to meet with me today.   12:18PM [id number] ENROLLMENT: Eligibility criteria are confirmed again prior to enrollment. Wanda Ross is assigned PID# [id number].  Wanda Ross. Wanda Ross, BSN, RN, CIC 12/22/2019 12:42 PM

## 2019-12-22 NOTE — Therapy (Signed)
[place], [place], [address] Phone: [phone]   Fax:  [phone]  Speech Language Pathology Treatment  Patient Details  Name: Wanda Ross MRN: [id number] Date of Birth: 05/07/1951 Referring Provider (SLP): Lauraine Rinne, PA-C   Encounter Date: 12/22/2019   End of Session - 12/22/19 1106    Visit Number 2    Number of Visits 8    Date for SLP Re-Evaluation 01/22/20    Authorization Type [employer] Medicare    SLP Start Time 0905    SLP Stop Time  0949    SLP Time Calculation (min) 44 min    Activity Tolerance Patient tolerated treatment well           Past Medical History:  Diagnosis Date  . Glaucoma   . Hyperlipidemia 2010  . Hypertension 1990  . IGT (impaired glucose tolerance) 05/11/2010  . Multinodular goiter 2010   normal function    Past Surgical History:  Procedure Laterality Date  . APPLICATION OF CRANIAL NAVIGATION Left 11/26/2019   Procedure: APPLICATION OF CRANIAL NAVIGATION;  Surgeon: [name], MD;  Location: [place];  Service: Neurosurgery;  Laterality: Left;  APPLICATION OF CRANIAL NAVIGATION  . COLONOSCOPY N/A 06/16/2016   Procedure: COLONOSCOPY;  Surgeon: Danie Binder, MD;  [facility];  Service: Endoscopy;  Laterality: N/A;  9:30 AM  . COLONOSCOPY N/A 10/22/2019   Procedure: COLONOSCOPY;  Surgeon: Daneil Dolin, MD;  [facility];  Service: Endoscopy;  Laterality: N/A;  9:30  . CRANIOTOMY Left 11/26/2019   Procedure: LEFT PARIETAL CRANIOTOMY FOR RESECTION OF TUMOR;  Surgeon: [name], MD;  Location: [place];  Service: Neurosurgery;  Laterality: Left;  LEFT PARIETAL CRANIOTOMY FOR RESECTION OF TUMOR  . EYE SURGERY  2017   3 on left eye 1 on right eye for glaucoma by Dr. Venetia Maxon   . EYE SURGERY  2018   Cataract Surgery by Dr. Arlina Robes   . OPERATIVE ULTRASOUND N/A 11/26/2019   Procedure: OPERATIVE ULTRASOUND;  Surgeon: [name], MD;   Location: [place] [facility];  Service: Neurosurgery;  Laterality: N/A;  OPERATIVE ULTRASOUND    There were no vitals filed for this visit.   Subjective Assessment - 12/22/19 1104    Subjective "I tried to do everything that was asked of me."    Currently in Pain? No/denies            ADULT SLP TREATMENT - 12/22/19 1104      General Information   Behavior/Cognition Alert;Cooperative;Pleasant mood    Patient Positioning Upright in chair    Oral care provided N/A    HPI Wanda Ross is a 69 y.o. right-handed female with history of hyperlipidemia and hypertension.  Lives alone 1 level home 2 steps to entry.  Independent prior to admission.  She does have a daughter and 2 sons in the area with good support.  Presented 11/22/2019 with altered mental status and gait abnormality.  Admission chemistries with potassium 2.8, glucose 155, urinalysis negative nitrite.  CT/MRI showed contrast-enhancing mass within the left parietal white matter extending along the corpus callosum splenium most consistent with high-grade glioma.  Moderate surrounding hyperintense T2 weighted signal likely combination of edema and nonenhancing tumor with a 3 mm rightward midline shift.  CT of the chest and abdomen pelvis showed no acute findings.  Patient underwent left craniotomy for resection parietal and splenial mass 11/26/2019 per Dr. Duffy Rhody. She attended inpatient  rehab and was discharged home with a recommendation for outpatient therapies. She was referred to outpatient SLP therapy by Virl Son, PA-C.       Treatment Provided   Treatment provided Cognitive-Linquistic      Pain Assessment   Pain Assessment No/denies pain      Cognitive-Linquistic Treatment   Treatment focused on Cognition;Patient/family/caregiver education    Skilled Treatment memory, self evaluation, visual scanning, following directions, planning/organization tasks      Assessment / Recommendations / Plan   Plan Continue with current  plan of care      Progression Toward Goals   Progression toward goals Progressing toward goals            SLP Education - 12/22/19 1106    Education Details Provided HEP for following written directions, planning activity, and visual scanning    Person(s) Educated Patient    Methods Explanation;Handout    Comprehension Verbalized understanding            SLP Short Term Goals - 12/22/19 1107      SLP SHORT TERM GOAL #1   Title Pt will implement memory strategies in functional therapy activities with 95% acc with min cues.    Baseline 75%    Time 4    Period Weeks    Status On-going    Target Date 01/22/20      SLP SHORT TERM GOAL #2   Title Pt will complete moderate-level thought organization and planning activities with 90% acc and min assist.    Baseline 80% but needs a lot of extra time    Time 4    Period Weeks    Status On-going    Target Date 01/22/20      SLP SHORT TERM GOAL #3   Title Pt will increase recall for requested information to 95% acc during functional memory exercises with use of compensatory strategies as needed when provided min cues.    Baseline 80%    Time 4    Period Weeks    Status On-going    Target Date 01/22/20      SLP SHORT TERM GOAL #4   Title Pt will complete moderately complex visual scanning activities during pencil/paper tasks with 90% acc with min assist and use of strategies as needed.    Baseline 70%    Time 4    Period Weeks    Status On-going    Target Date 01/22/20            SLP Long Term Goals - 12/22/19 1108      SLP LONG TERM GOAL #1   Title Same as short term goals            Plan - 12/22/19 1107    Clinical Impression Statement Pt completed moderately complex visual attention and scanning task with 100% acc when provided with mi/mod verbal cues from SLP to complete items in a serial fashion (she was skipping items and jumping around). She required double the amount of expected time to complete. She  started a planning and organization task, however took home to complete due to time constraints.     Speech Therapy Frequency 2x / week    Duration 4 weeks    Potential to Achieve Goals Good    SLP Home Exercise Plan Pt will completed HEP as assigned to facilitate carryover of treatment strategies and techniques in home environment.    Consulted and Agree with Plan of Care Patient  Patient will benefit from skilled therapeutic intervention in order to improve the following deficits and impairments:   Cognitive communication deficit    Problem List Patient Active Problem List   Diagnosis Date Noted  . Goals of care, counseling/discussion 12/15/2019  . Glioblastoma ([place])   . Hypoalbuminemia due to protein-calorie malnutrition ([place])   . Transaminitis   . Benign essential HTN   . Prediabetes   . Seizure prophylaxis   . Glioma ([place]) 12/02/2019  . Right sided weakness   . Dyslipidemia   . Essential hypertension   . Steroid-induced hyperglycemia   . Leucocytosis   . Brain mass 11/22/2019  . 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 Abnormal electrocardiogram (ECG) (EKG) 12/31/2017  . White coat syndrome with diagnosis of hypertension 12/31/2017  . Glaucoma 12/26/2013  . Vitamin D deficiency 05/27/2009  . IMPAIRED GLUCOSE TOLERANCE 05/27/2009  . OSTEOPENIA 05/27/2009  . Multiple thyroid nodules 11/06/2007  . Hyperlipidemia 11/06/2007  . Malignant hypertension 11/06/2007   Thank you,  Genene Churn, [place]  [facility] 12/22/2019, 11:08 AM  [place] [address] [place] [address] [place], [place], [address] Phone: [phone]   Fax:  [phone]   Name: Wanda Ross MRN: [id number] Date of Birth: [birth year]

## 2019-12-22 NOTE — Research (Signed)
[id number] - TESTING RAMIPRIL TO PREVENT MEMORY LOSS IN PEOPLE WITH GLIOBLASTOMA  DCP-001 USE OF A CLINICAL TRIAL SCREENING TOOL TO ADDRESS CANCER HEALTH DISPARITIES IN THE [facility] COMMUNITY ONCOLOGY RESEARCH PROGRAM ([facility])  12/22/2019 10:10AM  TELEPHONE CALL: Voicemail message received from [name] (patient's daughter) requesting call back. They ([name] and Spain) are in the [place] area today and would like to stop by to ask a few questions about the WF1801 study. Returned call and advised that I am available all day today and to stop by at their convenience.  10:40AM [id number] CONSENT: Met with Halie and her daughter [name] privately in the breast clinic conference room for 30 minutes. Both Azalie and [name] verbalize that the consent form(s) have been reviewed in full. An opportunity to ask questions was provided and all questions were answered. Upon close of discussion, shela esses a desire to voluntarily participate in the WF1801 Ramipril study. Zuri verbalizes understanding of the voluntary nature of participation, the study purpose, usual approach to treatment and alternatives to study participation, risks and benefits, Ramipril schedule, lab tests and neurocognitive testing, known Ramipril side effects, costs and compensation, coverage should injury occur and who will see the provided study information. Consent form (protocol version date 04/29/19, [place] Active Date 07/04/19) was signed by [place] and dated. HIPAA form (dated 04/03/17) was also reviewed and signed by [place]. Copies of both signed consent forms were provided. After obtaining informed consent and providing copies, the patient demographic and health behaviors form was completed by [name] per study protocol. Sheniece understands that an additional baseline visit will be required for research labs and neurocognitive testing; her son is available to bring her back to the cancer center Thursday morning to complete any  reminding study requirements. Current plan is to schedule a research lab appointment and a research appointment starting at 9:30am Thursday (12/25/19) for baseline information. A business card with my direct contact information is provided and both [place] and [name] are encouraged to contact me with any questions or concerns or if a schedule change is necessary. Eligibility criteria have been reviewed and confirmed by myself and clinical research nurse, Otilio Miu; eligibility will be confirmed again prior to enrollment. Jaleia has an appointment in [place] this morning and would like to defer DCP discussion until Thursday (DCP consent was not obtained today).   Current plan is to schedule a baseline visit for Thursday, 12/25/19, and dispense Ramipril on Monday, 12/29/19, on day one of chemoradiation. Nuria and [name] are escorted to the cancer center main entrance and are thanked for their time and willingness to meet with me today.   Dionne Bucy. Sharlett Iles, BSN, RN, CIC 12/22/2019 12:11 PM

## 2019-12-22 NOTE — Progress Notes (Signed)
Subjective:    Patient ID: Wanda Ross, female    DOB: [birth year], 69 y.o.   MRN: [id number]  HPI Right-handed female with history of hyperlipidemia and hypertension presents for hospital follow up for glioblastoma.     Daughter supplements history. At discharge, she was instructed to follow up with Neurosurg, which she did.  She also saw Neuro/Onc. Plan to begin therapy next week. BP is relatively controlled. Denies falls.  Therapies: SLP 2/week.  Released from PT/OT DME: Previously possessed  Mobility: No assistive device needed.   Pain Inventory Average Pain 0 Pain Right Now 0 My pain is no pain  In the last 24 hours, has pain interfered with the following? General activity no pain Relation with others 0 Enjoyment of life 0 What TIME of day is your pain at its worst? no pain Sleep (in general) Good  Pain is worse with: no pain Pain improves with: no pain Relief from Meds: no pain  Mobility walk without assistance how many minutes can you walk? 30  Function I need assistance with the following:  household duties and shopping  Neuro/Psych No problems in this area  Prior Studies Any changes since last visit?  no  Physicians involved in your care Any changes since last visit?  no Primary care Hietala MD  --has wellness call tomorrow   Family History  Problem Relation Age of Onset  . Dementia Father   . Dementia Mother   . Diabetes Mother   . Hypertension Mother   . Breast cancer Sister   . Hypertension Sister   . Diabetes Brother    Social History   Socioeconomic History  . Marital status: Widowed    Spouse name: Not on file  . Number of children: 3  . Years of education: Not on file  . Highest education level: Not on file  Occupational History  . Occupation: employed   Tobacco Use  . Smoking status: Never Smoker  . Smokeless tobacco: Never Used  Vaping Use  . Vaping Use: Never used  Substance and Sexual Activity  . Alcohol use: No  .  Drug use: No  . Sexual activity: Never    Birth control/protection: Post-menopausal  Other Topics Concern  . Not on file  Social History Narrative  . Not on file   Social Determinants of Health   Financial Resource Strain:   . Difficulty of Paying Living Expenses:   Food Insecurity:   . Worried About Charity fundraiser in the Last Year:   . Arboriculturist in the Last Year:   Transportation Needs:   . Film/video editor (Medical):   Marland Kitchen Lack of Transportation (Non-Medical):   Physical Activity:   . Days of Exercise per Week:   . Minutes of Exercise per Session:   Stress:   . Feeling of Stress :   Social Connections:   . Frequency of Communication with Friends and Family:   . Frequency of Social Gatherings with Friends and Family:   . Attends Religious Services:   . Active Member of Clubs or Organizations:   . Attends Archivist Meetings:   Marland Kitchen Marital Status:    Past Surgical History:  Procedure Laterality Date  . APPLICATION OF CRANIAL NAVIGATION Left 11/26/2019   Procedure: APPLICATION OF CRANIAL NAVIGATION;  Surgeon: [name], MD;  Location: [place];  Service: Neurosurgery;  Laterality: Left;  APPLICATION OF CRANIAL NAVIGATION  . COLONOSCOPY N/A 06/16/2016   Procedure: COLONOSCOPY;  Surgeon: Danie Binder, MD;  [facility];  Service: Endoscopy;  Laterality: N/A;  9:30 AM  . COLONOSCOPY N/A 10/22/2019   Procedure: COLONOSCOPY;  Surgeon: Daneil Dolin, MD;  [facility];  Service: Endoscopy;  Laterality: N/A;  9:30  . CRANIOTOMY Left 11/26/2019   Procedure: LEFT PARIETAL CRANIOTOMY FOR RESECTION OF TUMOR;  Surgeon: [name], MD;  Location: [place];  Service: Neurosurgery;  Laterality: Left;  LEFT PARIETAL CRANIOTOMY FOR RESECTION OF TUMOR  . EYE SURGERY  2017   3 on left eye 1 on right eye for glaucoma by Dr. Venetia Maxon   . EYE SURGERY  2018   Cataract Surgery by Dr. Arlina Robes   . OPERATIVE ULTRASOUND N/A 11/26/2019   Procedure:  OPERATIVE ULTRASOUND;  Surgeon: [name], MD;  Location: [facility];  Service: Neurosurgery;  Laterality: N/A;  OPERATIVE ULTRASOUND   Past Medical History:  Diagnosis Date  . Glaucoma   . Hyperlipidemia 2010  . Hypertension 1990  . IGT (impaired glucose tolerance) 05/11/2010  . Multinodular goiter 2010   normal function   BP (!) 144/86   Pulse 91   Temp 98.4 F (36.9 C)   Ht 5\' 8"  (1.727 m)   Wt 149 lb 6.4 oz (67.8 kg)   SpO2 97%   BMI 22.72 kg/m   Opioid Risk Score:   Fall Risk Score:  `1  Depression screen PHQ 2/9  Depression screen [facility] 2/9 12/22/2019 06/04/2019 12/19/2018 07/11/2018 02/06/2018 12/31/2017 12/17/2017  Decreased Interest 0 0 0 0 0 0 0  Down, Depressed, Hopeless 0 0 0 0 0 0 0  PHQ - 2 Score 0 0 0 0 0 0 0  Altered sleeping 0 - - - - - -  Tired, decreased energy 0 - - - - - -  Change in appetite 0 - - - - - -  Feeling bad or failure about yourself  0 - - - - - -  Trouble concentrating 0 - - - - - -  Moving slowly or fidgety/restless 0 - - - - - -  Suicidal thoughts 0 - - - - - -  PHQ-9 Score 0 - - - - - -  Difficult doing work/chores - - - - - - -   Review of Systems  Constitutional: Negative.   HENT: Negative.   Eyes: Negative.   Respiratory: Negative.   Cardiovascular: Negative.   Gastrointestinal: Negative.   Endocrine: Negative.   Genitourinary: Negative.   Musculoskeletal: Negative.   Skin: Negative.   Allergic/Immunologic: Negative.   Neurological: Negative.   Hematological: Negative.   Psychiatric/Behavioral: Negative.   All other systems reviewed and are negative.      Objective:   Physical Exam Constitutional: No distress . Vital signs reviewed. HENT: Normocephalic. Atraumatic. Eyes: EOMI. No discharge. Cardiovascular: No JVD.  Respiratory: Normal effort.   GI: Non-distended.  Skin: Warm and dry.  Intact.  Psych: Normal mood.  Normal behavior. Musc: No edema in extremities.  No tenderness in extremities. Neuro: Alert Motor:  4+-5/5 throughout, unchanged No dysmetria noted    Assessment & Plan:  Right-handed female with history of hyperlipidemia and hypertension presents for hospital follow up for glioblastoma.     1.  Altered mental status secondary to glioma.  Status post left craniotomy resection of tumor 11/26/2019.    Continue therapies  Continue follow up with Neuro/Onc  Follow up with Radiation/Onc  2.  Hypertension.    Cont meds  Controlled  at present  3. Glioblastoma:     Pathology positive for Grade 4 glioblastoma             See #1  Meds reviewed Referrals reviewed All questions answered

## 2019-12-22 NOTE — Telephone Encounter (Signed)
Oral Oncology Pharmacist Encounter  Received new prescription for Temodar (temozolomide) for the treatment of GBM in conjunction with XRT, planned duration until disease progression or unacceptable drug toxicity. Planned start 12/29/19.  Prescription dose and frequency assessed.   Current medication list in Epic reviewed, no DDIs with temozolomide identified.  Prescription has been e-scribed to the [facility] for benefits analysis and approval.  Oral Oncology Clinic will continue to follow for insurance authorization, copayment issues, initial counseling and start date.  Darl Pikes, PharmD, BCPS, BCOP, CPP Hematology/Oncology Clinical Pharmacist Practitioner [facility] [phone]  12/22/2019 10:47 AM

## 2019-12-23 ENCOUNTER — Telehealth (INDEPENDENT_AMBULATORY_CARE_PROVIDER_SITE_OTHER): Payer: Medicare PPO | Admitting: Family Medicine

## 2019-12-23 ENCOUNTER — Telehealth: Payer: Self-pay

## 2019-12-23 ENCOUNTER — Encounter: Payer: Self-pay | Admitting: Family Medicine

## 2019-12-23 VITALS — BP 144/86 | HR 91 | Ht 68.0 in | Wt 149.0 lb

## 2019-12-23 DIAGNOSIS — Z0000 Encounter for general adult medical examination without abnormal findings: Secondary | ICD-10-CM

## 2019-12-23 NOTE — Progress Notes (Signed)
Subjective:   Wanda Ross is a 69 y.o. female who presents for Medicare Annual (Subsequent) preventive examination.  Location of Patient: Home Location of Provider: Office Consent was obtain for visit to be over via telephone  I verified that I am speaking with the correct person using two identifiers.   Review of Systems    Cardiac Risk Factors include: advanced age (>33men, >48 women);dyslipidemia;hypertension     Objective:    Today's Vitals   12/23/19 0750 12/23/19 0751  BP: (!) 144/86   Pulse: 91   SpO2: 97%   Weight: 149 lb (67.6 kg)   Height: 5\' 8"  (1.727 m)   PainSc: 0-No pain 0-No pain   Body mass index is 22.66 kg/m.  Advanced Directives 12/19/2019 12/18/2019 12/17/2019 12/15/2019 12/02/2019 11/23/2019 11/21/2019  Does Patient Have a Medical Advance Directive? No Yes Yes No No No No  Type of Advance Directive - [place];Living will [place];Living will - - - -  Does patient want to make changes to medical advance directive? No - Guardian declined - No - Patient declined - - - -  Copy of [place] in Chart? - No - copy requested No - copy requested - - - -  Would patient like information on creating a medical advance directive? - - - No - Patient declined No - Patient declined No - Patient declined -    Current Medications (verified) Outpatient Encounter Medications as of 12/23/2019  Medication Sig  . acetaminophen (TYLENOL) 325 MG tablet Take 2 tablets (650 mg total) by mouth every 4 (four) hours as needed for mild pain (temp > 100.5).  Marland Kitchen alendronate (FOSAMAX) 70 MG tablet Take 1 tablet by mouth once a week.  Marland Kitchen amLODipine (NORVASC) 5 MG tablet Take 1 tablet (5 mg total) by mouth daily.  . Calcium Carbonate-Vit D-Min (CALCIUM 1200) 1200-1000 MG-UNIT CHEW Chew 1,200 mg by mouth daily.  . cloNIDine (CATAPRES) 0.3 MG tablet TAKE 1 TABLET BY MOUTH EVERYDAY AT BEDTIME  . Cyanocobalamin 2500 MCG TABS Take  2,500 mcg by mouth daily.  Marland Kitchen docusate sodium (COLACE) 100 MG capsule Take 1 capsule (100 mg total) by mouth 2 (two) times daily.  . dorzolamide-timolol (COSOPT) 22.3-6.8 MG/ML ophthalmic solution Place 1 drop into the right eye 2 (two) times daily.   . Multiple Vitamins-Minerals (CENTRUM SILVER ULTRA WOMENS) TABS Take 1 tablet by mouth daily.   . pravastatin (PRAVACHOL) 10 MG tablet TAKE 1 TABLET BY MOUTH DAILY *DOSE REDUCTION*  . ramipril (ALTACE) 1.25 MG capsule Take 1.25 mg by mouth as directed. Increase by one capsule weekly until you are taking 4 capsules daily  . [DISCONTINUED] ondansetron (ZOFRAN) 8 MG tablet Take 1 tablet (8 mg total) by mouth 2 (two) times daily as needed (nausea and vomiting). May take 30-60 minutes prior to Temodar administration if nausea/vomiting occurs. (Patient not taking: Reported on 12/22/2019)  . [DISCONTINUED] pantoprazole (PROTONIX) 40 MG tablet Take 1 tablet (40 mg total) by mouth daily. (Patient not taking: Reported on 12/23/2019)  . [DISCONTINUED] temozolomide (TEMODAR) 100 MG capsule Take 1 capsule (100 mg total) by mouth daily. May take on an empty stomach to decrease nausea & vomiting. (Patient not taking: Reported on 12/22/2019)  . [DISCONTINUED] temozolomide (TEMODAR) 20 MG capsule Take 1 capsule (20 mg total) by mouth daily. May take on an empty stomach to decrease nausea & vomiting. (Patient not taking: Reported on 12/22/2019)   No facility-administered encounter medications on file  as of 12/23/2019.    Allergies (verified) Patient has no known allergies.   History: Past Medical History:  Diagnosis Date  . Glaucoma   . Hyperlipidemia 2010  . Hypertension 1990  . IGT (impaired glucose tolerance) 05/11/2010  . Multinodular goiter 2010   normal function   Past Surgical History:  Procedure Laterality Date  . APPLICATION OF CRANIAL NAVIGATION Left 11/26/2019   Procedure: APPLICATION OF CRANIAL NAVIGATION;  Surgeon: [name], MD;  Location:  [place];  Service: Neurosurgery;  Laterality: Left;  APPLICATION OF CRANIAL NAVIGATION  . COLONOSCOPY N/A 06/16/2016   Procedure: COLONOSCOPY;  Surgeon: Danie Binder, MD;  [facility];  Service: Endoscopy;  Laterality: N/A;  9:30 AM  . COLONOSCOPY N/A 10/22/2019   Procedure: COLONOSCOPY;  Surgeon: Daneil Dolin, MD;  [facility];  Service: Endoscopy;  Laterality: N/A;  9:30  . CRANIOTOMY Left 11/26/2019   Procedure: LEFT PARIETAL CRANIOTOMY FOR RESECTION OF TUMOR;  Surgeon: [name], MD;  Location: [place];  Service: Neurosurgery;  Laterality: Left;  LEFT PARIETAL CRANIOTOMY FOR RESECTION OF TUMOR  . EYE SURGERY  2017   3 on left eye 1 on right eye for glaucoma by Dr. Venetia Maxon   . EYE SURGERY  2018   Cataract Surgery by Dr. Arlina Robes   . OPERATIVE ULTRASOUND N/A 11/26/2019   Procedure: OPERATIVE ULTRASOUND;  Surgeon: [name], MD;  Location: [place];  Service: Neurosurgery;  Laterality: N/A;  OPERATIVE ULTRASOUND   Family History  Problem Relation Age of Onset  . Dementia Father   . Dementia Mother   . Diabetes Mother   . Hypertension Mother   . Breast cancer Sister   . Hypertension Sister   . Diabetes Brother    Social History   Socioeconomic History  . Marital status: Widowed    Spouse name: Not on file  . Number of children: 3  . Years of education: Not on file  . Highest education level: Not on file  Occupational History  . Occupation: employed   Tobacco Use  . Smoking status: Never Smoker  . Smokeless tobacco: Never Used  Vaping Use  . Vaping Use: Never used  Substance and Sexual Activity  . Alcohol use: No  . Drug use: No  . Sexual activity: Never    Birth control/protection: Post-menopausal  Other Topics Concern  . Not on file  Social History Narrative  . Not on file   Social Determinants of Health   Financial Resource Strain:   . Difficulty of Paying Living Expenses:   Food Insecurity:   . Worried About Charity fundraiser  in the Last Year:   . Arboriculturist in the Last Year:   Transportation Needs:   . Film/video editor (Medical):   Marland Kitchen Lack of Transportation (Non-Medical):   Physical Activity:   . Days of Exercise per Week:   . Minutes of Exercise per Session:   Stress:   . Feeling of Stress :   Social Connections:   . Frequency of Communication with Friends and Family:   . Frequency of Social Gatherings with Friends and Family:   . Attends Religious Services:   . Active Member of Clubs or Organizations:   . Attends Archivist Meetings:   Marland Kitchen Marital Status:     Tobacco Counseling Counseling given: Yes   Clinical Intake:  Pre-visit preparation completed: Yes  Pain : No/denies pain Pain Score: 0-No pain  BMI - recorded: 22.66 Nutritional Status: BMI of 19-24  Normal Nutritional Risks: None Diabetes: No  How often do you need to have someone help you when you read instructions, pamphlets, or other written materials from your doctor or pharmacy?: 1 - Never What is the last grade level you completed in school?: Masters Degree  Diabetic?  no  Interpreter Needed?: No  Information entered by :: [facility] [place]   Activities of Daily Living In your present state of health, do you have any difficulty performing the following activities: 12/23/2019 12/02/2019  Hearing? N -  Vision? N -  Difficulty concentrating or making decisions? N -  Walking or climbing stairs? N -  Dressing or bathing? N -  Doing errands, shopping? N N  Preparing Food and eating ? N -  Using the Toilet? N -  In the past six months, have you accidently leaked urine? N -  Do you have problems with loss of bowel control? N -  Managing your Medications? N -  Managing your Finances? N -  Housekeeping or managing your Housekeeping? N -  Some recent data might be hidden    Patient Care Team: Fayrene Helper, MD as PCP - General Leta Baptist, MD as Consulting Physician (Otolaryngology) Florian Buff, MD as  Consulting Physician (Obstetrics and Gynecology) Despina Hick, MD as Consulting Physician (Ophthalmology) Gwyndolyn Kaufman, RN as Registered Nurse  Indicate any recent Medical Services you may have received from other than [facility] providers in the past year (date may be approximate).     Assessment:   This is a routine wellness examination for Hart.  Hearing/Vision screen No exam data present  Dietary issues and exercise activities discussed: Current Exercise Habits: Home exercise routine, Type of exercise: walking, Time (Minutes): 30, Frequency (Times/Week): 5, Weekly Exercise (Minutes/Week): 150, Intensity: Moderate, Exercise limited by: None identified  Goals    . Maintain current exercise program      Depression Screen PHQ 2/9 Scores 12/23/2019 12/22/2019 06/04/2019 12/19/2018 07/11/2018 02/06/2018 12/31/2017  PHQ - 2 Score 0 0 0 0 0 0 0  PHQ- 9 Score - 0 - - - - -    Fall Risk Fall Risk  12/23/2019 12/22/2019 06/04/2019 12/19/2018 07/11/2018  Falls in the past year? 0 0 0 0 0  Number falls in past yr: - - 0 - 0  Injury with Fall? - - 0 0 0    Any stairs in or around the home? Yes  If so, are there any without handrails? Yes  Home free of loose throw rugs in walkways, pet beds, electrical cords, etc? Yes  Adequate lighting in your home to reduce risk of falls? Yes   ASSISTIVE DEVICES UTILIZED TO PREVENT FALLS:  Life alert? No  Use of a cane, walker or w/c? No  Grab bars in the bathroom? Yes  Shower chair or bench in shower? Yes  Elevated toilet seat or a handicapped toilet? Yes   TIMED UP AND GO:  Was the test performed? n/a.  Length of time to ambulate 10 feet:     Cognitive Function:     6CIT Screen 12/23/2019 12/19/2018 12/17/2017 11/13/2016  What Year? 0 points 0 points 0 points 0 points  What month? 0 points 0 points 0 points 0 points  What time? 0 points 0 points 0 points 0 points  Count back from 20 2 points 0 points 0 points 0 points  Months in reverse 0 points  0 points 0 points 0  points  Repeat phrase 0 points 0 points 0 points 0 points  Total Score 2 0 0 0    Immunizations Immunization History  Administered Date(s) Administered  . Fluad Quad(high Dose 65+) 02/10/2019  . Influenza Split 02/15/2011, 02/08/2012  . Influenza Whole 05/10/2009, 03/16/2010  . Influenza,inj,Quad PF,6+ Mos 03/19/2013, 02/04/2014, 01/26/2015, 02/10/2016, 02/13/2017, 02/12/2018  . Pneumococcal Conjugate-13 12/26/2013  . Pneumococcal Polysaccharide-23 08/19/2013  . Tdap 09/27/2010  . Zoster 12/07/2008    TDAP status: Up to date Flu Vaccine status: Up to date Pneumococcal vaccine status: Up to date Covid-19 vaccine status: Completed vaccines  Qualifies for Shingles Vaccine? Yes   Zostavax completed No   Shingrix Completed?: No.    Education has been provided regarding the importance of this vaccine. Patient has been advised to call insurance company to determine out of pocket expense if they have not yet received this vaccine. Advised may also receive vaccine at local pharmacy or Health Dept. Verbalized acceptance and understanding.  Screening Tests Health Maintenance  Topic Date Due  . COVID-19 Vaccine (1) Never done  . INFLUENZA VACCINE  12/28/2019  . TETANUS/TDAP  09/26/2020  . MAMMOGRAM  06/24/2021  . COLONOSCOPY  10/21/2029  . DEXA SCAN  Completed  . Hepatitis C Screening  Completed  . PNA vac Low Risk Adult  Completed    Health Maintenance  Health Maintenance Due  Topic Date Due  . COVID-19 Vaccine (1) Never done    Colorectal cancer screening: Completed yes. Repeat every 10 years Mammogram status: Completed yes . Repeat every year Bone Density status: Completed yes. Results reflect: Bone density results: OSTEOPENIA. Repeat every 2 years.  Lung Cancer Screening: (Low Dose CT Chest recommended if Age 61-80 years, 30 pack-year currently smoking OR have quit w/in 15years.) does not qualify.   Lung Cancer Screening Referral: n/a  Additional  Screening:  Hepatitis C Screening: does qualify; Completed yes  Vision Screening: Recommended annual ophthalmology exams for early detection of glaucoma and other disorders of the eye. Is the patient up to date with their annual eye exam?  Yes  Who is the provider or what is the name of the office in which the patient attends annual eye exams? Dr Marlou Sa If pt is not established with a provider, would they like to be referred to a provider to establish care? No .   Dental Screening: Recommended annual dental exams for proper oral hygiene  Community Resource Referral / Chronic Care Management: CRR required this visit?  No   CCM required this visit?  No      Plan:     1. Encounter for Medicare annual wellness exam   I have personally reviewed and noted the following in the patient's chart:   . Medical and social history . Use of alcohol, tobacco or illicit drugs  . Current medications and supplements . Functional ability and status . Nutritional status . Physical activity . Advanced directives . List of other physicians . Hospitalizations, surgeries, and ER visits in previous 12 months . Vitals . Screenings to include cognitive, depression, and falls . Referrals and appointments  In addition, I have reviewed and discussed with patient certain preventive protocols, quality metrics, and best practice recommendations. A written personalized care plan for preventive services as well as general preventive health recommendations were provided to patient.     Perlie Mayo, NP   12/23/2019   I provided 20 minutes of non-face-to-face time during this encounter.

## 2019-12-23 NOTE — Patient Instructions (Signed)
Wanda Ross , Thank you for taking time to come for your Medicare Wellness Visit. I appreciate your ongoing commitment to your health goals. Please review the following plan we discussed and let me know if I can assist you in the future.   Please continue to practice social distancing to keep you, your family, and our community safe.  If you must go out, please wear a Mask and practice good handwashing.  Screening recommendations/referrals: Colonoscopy: up to date Mammogram: up to date Bone Density: up to date Recommended yearly ophthalmology/optometry visit for glaucoma screening and checkup Recommended yearly dental visit for hygiene and checkup  Vaccinations: Up to date  Conditions/risks identified: High blood pressure, Falls  Next appointment: 03/08/2020   Preventive Care 71 Years and Older, Female Preventive care refers to lifestyle choices and visits with your health care provider that can promote health and wellness. What does preventive care include?  A yearly physical exam. This is also called an annual well check.  Dental exams once or twice a year.  Routine eye exams. Ask your health care provider how often you should have your eyes checked.  Personal lifestyle choices, including:  Daily care of your teeth and gums.  Regular physical activity.  Eating a healthy diet.  Avoiding tobacco and drug use.  Limiting alcohol use.  Practicing safe sex.  Taking low-dose aspirin every day.  Taking vitamin and mineral supplements as recommended by your health care provider. What happens during an annual well check? The services and screenings done by your health care provider during your annual well check will depend on your age, overall health, lifestyle risk factors, and family history of disease. Counseling  Your health care provider may ask you questions about your:  Alcohol use.  Tobacco use.  Drug use.  Emotional well-being.  Home and relationship  well-being.  Sexual activity.  Eating habits.  History of falls.  Memory and ability to understand (cognition).  Work and work Statistician.  Reproductive health. Screening  You may have the following tests or measurements:  Height, weight, and BMI.  Blood pressure.  Lipid and cholesterol levels. These may be checked every 5 years, or more frequently if you are over 6 years old.  Skin check.  Lung cancer screening. You may have this screening every year starting at age 71 if you have a 30-pack-year history of smoking and currently smoke or have quit within the past 15 years.  Fecal occult blood test (FOBT) of the stool. You may have this test every year starting at age 71.  Flexible sigmoidoscopy or colonoscopy. You may have a sigmoidoscopy every 5 years or a colonoscopy every 10 years starting at age 71.  Hepatitis C blood test.  Hepatitis B blood test.  Sexually transmitted disease (STD) testing.  Diabetes screening. This is done by checking your blood sugar (glucose) after you have not eaten for a while (fasting). You may have this done every 1-3 years.  Bone density scan. This is done to screen for osteoporosis. You may have this done starting at age 71.  Mammogram. This may be done every 1-2 years. Talk to your health care provider about how often you should have regular mammograms. Talk with your health care provider about your test results, treatment options, and if necessary, the need for more tests. Vaccines  Your health care provider may recommend certain vaccines, such as:  Influenza vaccine. This is recommended every year.  Tetanus, diphtheria, and acellular pertussis (Tdap, Td) vaccine. You may  need a Td booster every 10 years.  Zoster vaccine. You may need this after age 71.  Pneumococcal 13-valent conjugate (PCV13) vaccine. One dose is recommended after age 71.  Pneumococcal polysaccharide (PPSV23) vaccine. One dose is recommended after age 71. Talk to your health care provider about which screenings and vaccines you need and how often you need them. This information is not intended to replace advice given to you by your health care provider. Make sure you discuss any questions you have with your health care provider. Document Released: 06/11/2015 Document Revised: 02/02/2016 Document Reviewed: 03/16/2015 Elsevier Interactive Patient Education  2017 [employer] Prevention in the Home Falls can cause injuries. They can happen to people of all ages. There are many things you can do to make your home safe and to help prevent falls. What can I do on the outside of my home?  Regularly fix the edges of walkways and driveways and fix any cracks.  Remove anything that might make you trip as you walk through a door, such as a raised step or threshold.  Trim any bushes or trees on the path to your home.  Use bright outdoor lighting.  Clear any walking paths of anything that might make someone trip, such as rocks or tools.  Regularly check to see if handrails are loose or broken. Make sure that both sides of any steps have handrails.  Any raised decks and porches should have guardrails on the edges.  Have any leaves, snow, or ice cleared regularly.  Use sand or salt on walking paths during winter.  Clean up any spills in your garage right away. This includes oil or grease spills. What can I do in the bathroom?  Use night lights.  Install grab bars by the toilet and in the tub and shower. Do not use towel bars as grab bars.  Use non-skid mats or decals in the tub or shower.  If you need to sit down in the shower, use a plastic, non-slip stool.  Keep the floor dry. Clean up any water that spills on the floor as soon as it happens.  Remove soap buildup in the tub or shower regularly.  Attach bath mats securely with double-sided non-slip rug tape.  Do not have throw rugs and other things on the floor that can make  you trip. What can I do in the bedroom?  Use night lights.  Make sure that you have a light by your bed that is easy to reach.  Do not use any sheets or blankets that are too big for your bed. They should not hang down onto the floor.  Have a firm chair that has side arms. You can use this for support while you get dressed.  Do not have throw rugs and other things on the floor that can make you trip. What can I do in the kitchen?  Clean up any spills right away.  Avoid walking on wet floors.  Keep items that you use a lot in easy-to-reach places.  If you need to reach something above you, use a strong step stool that has a grab bar.  Keep electrical cords out of the way.  Do not use floor polish or wax that makes floors slippery. If you must use wax, use non-skid floor wax.  Do not have throw rugs and other things on the floor that can make you trip. What can I do with my stairs?  Do not leave any items on  the stairs.  Make sure that there are handrails on both sides of the stairs and use them. Fix handrails that are broken or loose. Make sure that handrails are as long as the stairways.  Check any carpeting to make sure that it is firmly attached to the stairs. Fix any carpet that is loose or worn.  Avoid having throw rugs at the top or bottom of the stairs. If you do have throw rugs, attach them to the floor with carpet tape.  Make sure that you have a light switch at the top of the stairs and the bottom of the stairs. If you do not have them, ask someone to add them for you. What else can I do to help prevent falls?  Wear shoes that:  Do not have high heels.  Have rubber bottoms.  Are comfortable and fit you well.  Are closed at the toe. Do not wear sandals.  If you use a stepladder:  Make sure that it is fully opened. Do not climb a closed stepladder.  Make sure that both sides of the stepladder are locked into place.  Ask someone to hold it for you, if  possible.  Clearly mark and make sure that you can see:  Any grab bars or handrails.  First and last steps.  Where the edge of each step is.  Use tools that help you move around (mobility aids) if they are needed. These include:  Canes.  Walkers.  Scooters.  Crutches.  Turn on the lights when you go into a dark area. Replace any light bulbs as soon as they burn out.  Set up your furniture so you have a clear path. Avoid moving your furniture around.  If any of your floors are uneven, fix them.  If there are any pets around you, be aware of where they are.  Review your medicines with your doctor. Some medicines can make you feel dizzy. This can increase your chance of falling. Ask your doctor what other things that you can do to help prevent falls. This information is not intended to replace advice given to you by your health care provider. Make sure you discuss any questions you have with your health care provider. Document Released: 03/11/2009 Document Revised: 10/21/2015 Document Reviewed: 06/19/2014 Elsevier Interactive Patient Education  2017 [employer].

## 2019-12-23 NOTE — Telephone Encounter (Signed)
[id number] - TESTING RAMIPRIL TO PREVENT MEMORY LOSS IN PEOPLE WITH GLIOBLASTOMA  12/23/2019 13:45PM  OUTGOING TELEPHONE CALL: Outgoing call (five minutes) to Saraann's daughter [name] per request as a reminder of the upcoming baseline visit for [id number], scheduled for Thursday, 12/25/19 at 9:30am for lab work and subsequent research visit. [name] verbalizes understanding and states her brother is aware of the visit and plans to bring [place] that day. [name] isthanked for her time and willingness tospeak with me today and is encouraged to call for any questions or concerns.  Dionne Bucy. Sharlett Iles, BSN, RN, CIC 12/23/2019 1:52 PM

## 2019-12-23 NOTE — Telephone Encounter (Addendum)
Oral Chemotherapy Pharmacist Encounter  Temodar will be shipped to the patient on 12/24/2019 and delivered on 12/25/2019. Patient will start medication on Sunday night 12/28/2019, prior to starting radiation on 12/29/2019.  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  Patient will likely continue on Temodar for maintenance treatment for 6-12 cycles after completion of concomitant phase.  Counseled patient on administration, dosing, side effects, monitoring, drug-food interactions, safe handling, storage, and disposal.  Patient will take Temodar 20mg  capsules and Temodar 100mg  capsules, 120 mg total daily dose, by mouth once daily, may take at bedtime and on an empty stomach to decrease nausea and vomiting.  Patient will take Temodar concurrent with radiation for 42 days straight.  Temodar start date: 12/28/2019 PM Radiation start date: 12/29/2019   Patient will take Zofran 8mg  tablet, 1 tablet by mouth 30-60 min prior to Temodar dose to help decrease N/V once starting adjuvant therapy.  Prophylactic Zofran will not be used at initiation of concurrent phase, but will be initiated if nausea develops despite Temodar administration on an empty stomach and at bedtime.   Adverse effects include but are not limited to: nausea, vomiting, anorexia, GI upset, rash, drug fever, and fatigue. Rare but serious adverse effects of pneumocystis pneumonia and secondary malignancy also discussed.  PCP prophylaxis will not be initiated at this time, but may be added based on lymphocyte count in the future.  Reviewed with patient importance of keeping a medication schedule and plan for any missed doses.  Medication reconciliation performed and medication/allergy list updated.  This will ship from the [facility] on 12/24/2019 to deliver to patient's home  on 12/25/2019.  Patient informed the pharmacy will reach out 5-7 days prior to needing next fill of Temodar to coordinate continued medication acquisition to prevent break in therapy.   All questions answered.  Ms. Darnelle Bos voiced understanding and appreciation.   Patient knows to call the office with questions or concerns.  Leron Croak, PharmD, BCPS Hematology/Oncology Clinical Pharmacist [facility] [phone] 12/23/2019 10:40 AM

## 2019-12-24 ENCOUNTER — Other Ambulatory Visit: Payer: Self-pay

## 2019-12-24 ENCOUNTER — Encounter (HOSPITAL_COMMUNITY): Payer: Self-pay | Admitting: Speech Pathology

## 2019-12-24 ENCOUNTER — Ambulatory Visit (HOSPITAL_COMMUNITY): Payer: Medicare PPO | Admitting: Speech Pathology

## 2019-12-24 ENCOUNTER — Telehealth: Payer: Self-pay | Admitting: Radiation Oncology

## 2019-12-24 DIAGNOSIS — R41841 Cognitive communication deficit: Secondary | ICD-10-CM | POA: Diagnosis not present

## 2019-12-24 DIAGNOSIS — R29898 Other symptoms and signs involving the musculoskeletal system: Secondary | ICD-10-CM | POA: Diagnosis not present

## 2019-12-24 DIAGNOSIS — C713 Malignant neoplasm of parietal lobe: Secondary | ICD-10-CM | POA: Diagnosis not present

## 2019-12-24 DIAGNOSIS — M6281 Muscle weakness (generalized): Secondary | ICD-10-CM | POA: Diagnosis not present

## 2019-12-24 DIAGNOSIS — G9389 Other specified disorders of brain: Secondary | ICD-10-CM | POA: Diagnosis not present

## 2019-12-24 MED FILL — TEMOZOLOMIDE 20 MG CAPS: 20 | 28 days supply | Qty: 28 | Fill #0

## 2019-12-24 MED FILL — TEMOZOLOMIDE 100 MG CAPS: 100 | 28 days supply | Qty: 28 | Fill #0

## 2019-12-24 NOTE — Therapy (Signed)
[facility] [place], [place], [address] Phone: [phone]   Fax:  [phone]  Speech Language Pathology Treatment  Patient Details  Name: Wanda Ross MRN: [id number] Date of Birth: [birth year] Referring Provider (SLP): Lauraine Rinne, PA-C   Encounter Date: 12/24/2019   End of Session - 12/24/19 1125    Visit Number 3    Number of Visits 8    Date for SLP Re-Evaluation 01/22/20    Authorization Type [employer]    SLP Start Time 0950    SLP Stop Time  1033    SLP Time Calculation (min) 43 min    Activity Tolerance Patient tolerated treatment well           Past Medical History:  Diagnosis Date  . Glaucoma   . Hyperlipidemia 2010  . Hypertension 1990  . IGT (impaired glucose tolerance) 05/11/2010  . Multinodular goiter 2010   normal function    Past Surgical History:  Procedure Laterality Date  . APPLICATION OF CRANIAL NAVIGATION Left 11/26/2019   Procedure: APPLICATION OF CRANIAL NAVIGATION;  Surgeon: [name], MD;  Location: [facility];  Service: Neurosurgery;  Laterality: Left;  APPLICATION OF CRANIAL NAVIGATION  . COLONOSCOPY N/A 06/16/2016   Procedure: COLONOSCOPY;  Surgeon: Danie Binder, MD;  [facility];  Service: Endoscopy;  Laterality: N/A;  9:30 AM  . COLONOSCOPY N/A 10/22/2019   Procedure: COLONOSCOPY;  Surgeon: Daneil Dolin, MD;  [facility];  Service: Endoscopy;  Laterality: N/A;  9:30  . CRANIOTOMY Left 11/26/2019   Procedure: LEFT PARIETAL CRANIOTOMY FOR RESECTION OF TUMOR;  Surgeon: [name], MD;  Location: [facility];  Service: Neurosurgery;  Laterality: Left;  LEFT PARIETAL CRANIOTOMY FOR RESECTION OF TUMOR  . EYE SURGERY  2017   3 on left eye 1 on right eye for glaucoma by Dr. Venetia Maxon   . EYE SURGERY  2018   Cataract Surgery by Dr. Arlina Robes   . OPERATIVE ULTRASOUND N/A 11/26/2019   Procedure: OPERATIVE ULTRASOUND;  Surgeon: [name], MD;   Location: [facility];  Service: Neurosurgery;  Laterality: N/A;  OPERATIVE ULTRASOUND    There were no vitals filed for this visit.   Subjective Assessment - 12/24/19 1122    Subjective "I did it and it should be in my folder."    Patient is accompained by: Family member    Currently in Pain? No/denies              ADULT SLP TREATMENT - 12/24/19 1123      General Information   Behavior/Cognition Alert;Cooperative;Pleasant mood    Patient Positioning Upright in chair    Oral care provided N/A    HPI Wanda Ross is a 69 y.o. right-handed female with history of hyperlipidemia and hypertension.  Lives alone 1 level home 2 steps to entry.  Independent prior to admission.  She does have a daughter and 2 sons in the area with good support.  Presented 11/22/2019 with altered mental status and gait abnormality.  Admission chemistries with potassium 2.8, glucose 155, urinalysis negative nitrite.  CT/MRI showed contrast-enhancing mass within the left parietal white matter extending along the corpus callosum splenium most consistent with high-grade glioma.  Moderate surrounding hyperintense T2 weighted signal likely combination of edema and nonenhancing tumor with a 3 mm rightward midline shift.  CT of the chest and abdomen pelvis showed no acute findings.  Patient underwent left craniotomy for resection parietal  and splenial mass 11/26/2019 per Dr. Duffy Rhody. She attended inpatient rehab and was discharged home with a recommendation for outpatient therapies. She was referred to outpatient SLP therapy by Virl Son, PA-C.       Treatment Provided   Treatment provided Cognitive-Linquistic      Pain Assessment   Pain Assessment No/denies pain      Cognitive-Linquistic Treatment   Treatment focused on Cognition;Patient/family/caregiver education    Skilled Treatment memory, self evaluation, visual scanning, following directions, planning/organization tasks      Assessment /  Recommendations / Plan   Plan Continue with current plan of care      Progression Toward Goals   Progression toward goals Progressing toward goals            SLP Education - 12/24/19 1124    Education Details Encouraged Pt/family to bring in a notebook to organize homework; complete one item at a time before moving on to the next    Person(s) Educated Patient;Caregiver(s)    Methods Explanation;Handout    Comprehension Verbalized understanding            SLP Short Term Goals - 12/24/19 1156      SLP SHORT TERM GOAL #1   Title Pt will implement memory strategies in functional therapy activities with 95% acc with min cues.    Baseline 75%    Time 4    Period Weeks    Status On-going    Target Date 01/22/20      SLP SHORT TERM GOAL #2   Title Pt will complete moderate-level thought organization and planning activities with 90% acc and min assist.    Baseline 80% but needs a lot of extra time    Time 4    Period Weeks    Status On-going    Target Date 01/22/20      SLP SHORT TERM GOAL #3   Title Pt will increase recall for requested information to 95% acc during functional memory exercises with use of compensatory strategies as needed when provided min cues.    Baseline 80%    Time 4    Period Weeks    Status On-going    Target Date 01/22/20      SLP SHORT TERM GOAL #4   Title Pt will complete moderately complex visual scanning activities during pencil/paper tasks with 90% acc with min assist and use of strategies as needed.    Baseline 70%    Time 4    Period Weeks    Status On-going    Target Date 01/22/20            SLP Long Term Goals - 12/22/19 1108      SLP LONG TERM GOAL #1   Title Same as short term goals            Plan - 12/24/19 1155    Clinical Impression Statement Pt was accompanied to therapy by her sister this date. She brought her folder with some of HEP, however left some important pieces at home. Pt had difficulty locating items and  organizing items in her folder. During completion of pen and paper tasks, she skipped some rows and left them incomplete with reduced awareness. When SLP queried, she stated that she didn't know that she was supposed to complete items in order. She was provided written cues to complete one item before moving on to another task/item. It was recommended that she bring in a notebook next session so that  we can organize all of her work better. Her sister reports that typically, Pt is the most organized person in the family. Her schedule is going to be adjusted as she will start radiation next week. She will also be taking Ramipril as part of an experimental treatment to stop memory loss in people with glioblastoma. Pt given additional HEP and we will complete written planning task next session.    Speech Therapy Frequency 2x / week    Duration 4 weeks    Potential to Achieve Goals Good    SLP Home Exercise Plan Pt will completed HEP as assigned to facilitate carryover of treatment strategies and techniques in home environment.    Consulted and Agree with Plan of Care Patient           Patient will benefit from skilled therapeutic intervention in order to improve the following deficits and impairments:   Cognitive communication deficit    Problem List Patient Active Problem List   Diagnosis Date Noted  . Goals of care, counseling/discussion 12/15/2019  . Glioblastoma ([place])   . Hypoalbuminemia due to protein-calorie malnutrition ([place])   . Transaminitis   . Benign essential HTN   . Prediabetes   . Seizure prophylaxis   . Glioma ([place]) 12/02/2019  . Right sided weakness   . Dyslipidemia   . Essential hypertension   . Steroid-induced hyperglycemia   . Leucocytosis   . Brain mass 11/22/2019  . 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 Abnormal electrocardiogram (ECG) (EKG) 12/31/2017  . White coat syndrome with diagnosis of hypertension 12/31/2017  . Glaucoma 12/26/2013  . Vitamin D deficiency  05/27/2009  . IMPAIRED GLUCOSE TOLERANCE 05/27/2009  . OSTEOPENIA 05/27/2009  . Multiple thyroid nodules 11/06/2007  . Hyperlipidemia 11/06/2007  . 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 [place], [place], [address] Phone: [phone]   Fax:  [phone]   Name: Wanda Ross MRN: [id number] Date of Birth: 1950/11/06

## 2019-12-24 NOTE — Telephone Encounter (Signed)
Scheduled appointments per 7/28 scheduling message. Patient is aware of appointments dates and times.  

## 2019-12-25 ENCOUNTER — Inpatient Hospital Stay: Payer: Medicare PPO

## 2019-12-25 ENCOUNTER — Other Ambulatory Visit: Payer: Self-pay

## 2019-12-25 ENCOUNTER — Telehealth: Payer: Self-pay | Admitting: Family Medicine

## 2019-12-25 ENCOUNTER — Other Ambulatory Visit: Payer: Self-pay | Admitting: Internal Medicine

## 2019-12-25 ENCOUNTER — Telehealth: Payer: Self-pay

## 2019-12-25 DIAGNOSIS — C719 Malignant neoplasm of brain, unspecified: Secondary | ICD-10-CM

## 2019-12-25 DIAGNOSIS — C713 Malignant neoplasm of parietal lobe: Secondary | ICD-10-CM | POA: Diagnosis not present

## 2019-12-25 DIAGNOSIS — Z803 Family history of malignant neoplasm of breast: Secondary | ICD-10-CM | POA: Diagnosis not present

## 2019-12-25 LAB — CMP (CANCER CENTER ONLY)
ALT: 18 U/L (ref 0–44)
AST: 19 U/L (ref 15–41)
Albumin: 3.8 g/dL (ref 3.5–5.0)
Alkaline Phosphatase: 87 U/L (ref 38–126)
Anion gap: 8 (ref 5–15)
BUN: 11 mg/dL (ref 8–23)
CO2: 26 mmol/L (ref 22–32)
Calcium: 9.6 mg/dL (ref 8.9–10.3)
Chloride: 110 mmol/L (ref 98–111)
Creatinine: 0.78 mg/dL (ref 0.44–1.00)
GFR, Est AFR Am: >60 mL/min (ref >60)
GFR, Estimated: >60 mL/min (ref >60)
Glucose, Bld: 117 mg/dL — ABNORMAL HIGH (ref 70–99)
Potassium: 3.9 mmol/L (ref 3.5–5.1)
Sodium: 144 mmol/L (ref 135–145)
Total Bilirubin: 0.5 mg/dL (ref 0.3–1.2)
Total Protein: 6.6 g/dL (ref 6.5–8.1)

## 2019-12-25 LAB — CBC WITH DIFFERENTIAL (CANCER CENTER ONLY)
Abs Immature Granulocytes: 0.02 10*3/uL (ref 0.00–0.07)
Basophils Absolute: 0 10*3/uL (ref 0.0–0.1)
Basophils Relative: 0 %
Eosinophils Absolute: 0.1 10*3/uL (ref 0.0–0.5)
Eosinophils Relative: 2 %
HCT: 39.8 % (ref 36.0–46.0)
Hemoglobin: 13.5 g/dL (ref 12.0–15.0)
Immature Granulocytes: 0 %
Lymphocytes Relative: 26 %
Lymphs Abs: 1.2 10*3/uL (ref 0.7–4.0)
MCH: 29.3 pg (ref 26.0–34.0)
MCHC: 33.9 g/dL (ref 30.0–36.0)
MCV: 86.3 fL (ref 80.0–100.0)
Monocytes Absolute: 0.4 10*3/uL (ref 0.1–1.0)
Monocytes Relative: 9 %
Neutro Abs: 2.9 10*3/uL (ref 1.7–7.7)
Neutrophils Relative %: 63 %
Platelet Count: 322 10*3/uL (ref 150–400)
RBC: 4.61 MIL/uL (ref 3.87–5.11)
RDW: 14.1 % (ref 11.5–15.5)
WBC Count: 4.6 10*3/uL (ref 4.0–10.5)
nRBC: 0 % (ref 0.0–0.2)

## 2019-12-25 LAB — RESEARCH LABS

## 2019-12-25 NOTE — Telephone Encounter (Signed)
She can come in office later if a conflict with radiation, I believe she has ahd or should have  transitional video or phone visit , you may check on that

## 2019-12-25 NOTE — Research (Signed)
[id number] - TESTING RAMIPRIL TO PREVENT MEMORY LOSS IN PEOPLE WITH GLIOBLASTOMA  DCP-001 USE OF A CLINICAL TRIAL SCREENING TOOL TO ADDRESS CANCER HEALTH DISPARITIES IN THE [employer] COMMUNITY ONCOLOGY RESEARCH PROGRAM ([facility])  12/25/2019 9:30AM  Ms. Wanda Ross arrives today to complete PROs, neurocognitive testing, ApoE Genotype specimen collection and medication diary education for voluntary participation in the WF1801 Ramipril study, as well as consent for voluntary participation in the DCP-001 study. Wanda Ross is accompanied by her son, Wanda Ross today.  LABS: The ApoE Genotype specimen is collected via peripheral stick without issue or complaint. Since Wanda Ross had a CMP completed while hospitalized (last CMP collected 12/08/2019 - within 14 days of enrollment on 12/22/2019), a CMP is not required today (verified with study staff). Since today is Thursday, the ApoE Genotype specimen will be refrigerated  and shipped on Monday per study protocol (also verified with study staff during Ramipril call that occurred on 12/24/2019).  BLOOD PRESSURE: Wanda Ross states, "I have white coat syndrome. My blood pressure is always high when I'm at the doctor." Her initial blood pressure today is 194/87 in the left arm. After remaining seated comfortably for one minute, her repeat B/P is 182/82. Her B/P will be checked again on Monday (these reading are not unusual based on her past B/Ps). Blood pressures will be monitored carefully throughout the study.  EDUCATION: Following lab collection, Wanda Ross and Wanda Ross are escorted to the clinical research audit room for an overview of the Ramipril study Wanda Ross was not present for the consenting visit). Information on taking Ramipril and how to complete the medication diary is also provided. An opportunity to ask questions was provided and all questions were answered. Wanda Ross was advised to take one 1.25mg  Ramipril tablet every day at bedtime, with or without food, starting  Monday, 12/29/2019 (first day of chemoradiation) for the first week. Ramipril drug was not dispensed today: I will dispense the drug on Monday following Wanda Ross's first treatment. Wanda Ross was advised that should a dose of Ramipril be missed, as long as it is less than 4 hours late, she may take the dose; otherwise, (if greater than 4 hours) the dose should not be taken. Wanda Ross is advised to avoid potassium supplements, including salt substitutes, as well as avoiding NSAIDS, such as aspirin or Motrin. Wanda Ross verbalizes understanding and states she will use Tylenol if needed for headache pain (already included on her MAR). Wanda Ross and Wanda Ross are also advised of any expected side effects from taking Ramipril, particularly dizziness or lightheadedness since she is already taking medications for hypertension. She is strongly encouraged to rise slowly to a standing position and to drink plenty of water while taking Ramipril: she verbalizes understanding. Wanda Ross also understands that we will be discussing any adverse effects during her weekly meetings and is encouraged to report any/all side effects. All symptoms requiring immediate attention (p. 66 of protocol) are reviewed and she is encouraged to seek medical attention for any of those symptoms. A copy of the monthly Ramipril diary is reviewed, emphasizing the importance of taking her medications around the same time every evening (she feels around 9:30pm would be a good time for her) and documenting the number of pills taken daily. Wanda Ross is instructed to bring her medication dairy with her to her follow-up appointments and she verbalizes understanding. Wanda Ross is advised that she will have labs drawn every Friday following radiation so that results are available on Monday to titrate Ramipril dosing. I instructed her that I will meet with her on Mondays (  following radiation) to review labs and titrate dosing based on lab results and adverse effects (if  any). According to Wanda Ross (Wanda Ross's sister) will accompany Wanda Ross on Mondays and Tuesdays, Wanda Ross will accompany her on Wednesdays, Wanda Ross (Wanda Ross's other son) will be with her on Thursdays and Wanda Ross (Wanda Ross) will be with her on Fridays.   MEDICATION REVIEW: Wanda Ross and I reviewed her current medication list which is up-to-date and current.   NEUROCOGNITIVE ASSESSMENT: Following Ramipril education, the baseline neurocognitive assessment is performed by trained clinical research coordinator Wanda Ross. Wanda Ross completed the assessment without complaint.   DCP-001 USE OF A CLINICAL TRIAL SCREENING TOOL TO ADDRESS CANCER HEALTH DISPARITIES IN THE [employer] COMMUNITY ONCOLOGY RESEARCH PROGRAM ([facility])   DCP-001 CONSENT: Wanda Ross and I (along with Wanda Ross) had discussed DCP participation during our initial visit and blank copies of the consent were provided at that time. Since we had time today, the DCP study was discussed again with Wanda Ross present. Since Wanda Ross meets eligibility for participation in an Wanda Ross study, she is also eligible for DCP participation. Since Wanda Ross missed the initial DCP discussion on 12/17/2019 (see notes), we reviewed the DCP-001 consent form (protocol version date 11/19/18, Wanda Ross Active Date 06/03/19) again, including the voluntary nature of participation, the project purpose, risks and benefits, and who will see the provided information. Wanda Ross understands that this study is a one-time consent for collection of demographic variables and that no patient identifiers are being reported. We also reviewed the [employer] form (dated 07/20/14) including the data to be collected, why the information is being collected, who will see the information, and safety measures to keep information private. All questions were answered. Upon completion of review, [name] a desire to voluntarily participate in DCP-001. All consent and authorization forms  were signed and dated by the patient and signed copies were provided.   DCP-001 WORKSHEET: After informed consent was obtained, the DCP-001 Worksheet was completed to collect ethnicity, language, literacy, education, employment, income and smoking history not available in the electronic medical record. Dustin has no smoking history so those questions are not completed.  Copies of the DCP consent forms, the Ramipril administration schedule (p. 66 of the protocol), and a copy of the Ramipril medication diary are provided in an envelope along with a copy of my business card with direct contact information for any questions or concerns. Approximately 90 minutes were spent completing today's activities.  Upon completion of above activities, Wanda Ross and Wanda Ross are escorted to the cancer center main entrance and both are thanked for their time and willingness to participate in both studies.   Current plan is for Wanda Ross to start chemoradiation on Monday, 12/29/2019 and the Ramipril along with the medication diary and administration schedule will be distributed at that time.   Wanda Ross. Sharlett Iles, BSN, RN, CIC 12/25/2019 1:56 PM

## 2019-12-25 NOTE — Telephone Encounter (Signed)
Patient's son called and says that Wanda Ross has radiation treatments Monday through Friday until Sept. Does Dr. Moshe Cipro still need to get her in prior to her Oct.11th appt.

## 2019-12-25 NOTE — Telephone Encounter (Signed)
LVM for the pt to call me to schedule an appointment

## 2019-12-25 NOTE — Telephone Encounter (Signed)
-----   Message from Fayrene Helper, MD sent at 12/17/2019 12:16 PM EDT ----- Regarding: needs post hospital follow up was d/c 7/14 I have tried calling her several times, no success

## 2019-12-26 NOTE — Telephone Encounter (Signed)
She can wait until after Oct to come in the office but Dr Moshe Cipro would like to do a hospital follow up video/phone if she would like.

## 2019-12-29 ENCOUNTER — Other Ambulatory Visit: Payer: Self-pay

## 2019-12-29 ENCOUNTER — Ambulatory Visit
Admission: RE | Admit: 2019-12-29 | Discharge: 2019-12-29 | Disposition: A | Discharge status: Home / Self Care | Payer: Medicare PPO | Source: Ambulatory Visit | Attending: Radiation Oncology | Admitting: Radiation Oncology

## 2019-12-29 ENCOUNTER — Inpatient Hospital Stay: Payer: Medicare PPO | Attending: Internal Medicine

## 2019-12-29 ENCOUNTER — Ambulatory Visit (HOSPITAL_COMMUNITY): Payer: Medicare PPO | Admitting: Speech Pathology

## 2019-12-29 VITALS — BP 154/89

## 2019-12-29 DIAGNOSIS — Z510 Encounter for antineoplastic radiation therapy: Secondary | ICD-10-CM | POA: Insufficient documentation

## 2019-12-29 DIAGNOSIS — C719 Malignant neoplasm of brain, unspecified: Secondary | ICD-10-CM

## 2019-12-29 DIAGNOSIS — C713 Malignant neoplasm of parietal lobe: Secondary | ICD-10-CM | POA: Insufficient documentation

## 2019-12-29 MED ORDER — INV-RAMIPRIL 1.25 MG CAP WF-1801 (#133): 1.2500 mg | ORAL_CAPSULE | Freq: Every day | ORAL | 0 refills | Status: DC

## 2019-12-29 NOTE — Research (Signed)
[id number] - TESTING RAMIPRIL TO PREVENT MEMORY LOSS IN PEOPLE WITH GLIOBLASTOMA  12/29/2019 10:00AM  Wanda Ross arrives today to begin chemoradiation; she is accompanied by her sister, Wanda Ross today.  RAMIPRIL PICKUP: Study provided Ramipril was picked up by this nurse at the [facility] pharmacy: drug was dispensed by Eliezer Lofts in the pharmacy.  RADIATION: Wanda Ross tolerates her first treatment without complaint.  BLOOD PRESSURE: Following radiation, Wanda Ross and Wanda Ross are escorted to the radiation office for a blood pressure check. Today her initial blood pressure is 174/102 in the left arm. After remaining seated comfortably for one minute, her repeat B/P is 154/89.   RAMIPRIL DISTRIBUTION: Following her blood pressure check, Wanda Ross is provided with one bottle of Ramipril 1.25mg , No. [id number], Lot# [id number], #133 tablets. Her Ramipril administration schedule and her August medication diary are also provided. Wanda Ross is reminded to take one pill every evening (with or without food) and document the number of tablets taken on her diary. She is reminded not to leave the cancer center on Friday before having labs drawn: she verbalizes understanding. She is also reminded to bring her Ramipril bottle and her diary with her next Monday, 01/05/2020; both Wanda Ross and Wanda Ross verbalize understanding.  Upon completion of above activities, Wanda Ross and Wanda Ross are escorted to the cancer center main entrance. Both are thanked for their time and Wanda Ross is thanked for her willingness to participate in the WF-1801 study. They are both encouraged to call me with any questions or issues and a copy of my business card with direct contact information is provided..   Current plan is to draw CMP on Friday, 01/02/2020, following radiation so results can be used for dosing next week.  Dionne Bucy. Sharlett Iles, BSN, RN, CIC 12/29/2019 11:47 AM

## 2019-12-30 ENCOUNTER — Ambulatory Visit
Admission: RE | Admit: 2019-12-30 | Discharge: 2019-12-30 | Disposition: A | Discharge status: Home / Self Care | Payer: Medicare PPO | Source: Ambulatory Visit | Attending: Radiation Oncology | Admitting: Radiation Oncology

## 2019-12-30 ENCOUNTER — Encounter (HOSPITAL_COMMUNITY): Payer: Self-pay | Admitting: Speech Pathology

## 2019-12-30 ENCOUNTER — Ambulatory Visit (HOSPITAL_COMMUNITY): Payer: Medicare PPO | Attending: Physician Assistant | Admitting: Speech Pathology

## 2019-12-30 ENCOUNTER — Other Ambulatory Visit: Payer: Self-pay

## 2019-12-30 DIAGNOSIS — R41841 Cognitive communication deficit: Secondary | ICD-10-CM | POA: Diagnosis not present

## 2019-12-30 DIAGNOSIS — C713 Malignant neoplasm of parietal lobe: Secondary | ICD-10-CM | POA: Diagnosis not present

## 2019-12-30 DIAGNOSIS — Z510 Encounter for antineoplastic radiation therapy: Secondary | ICD-10-CM | POA: Diagnosis not present

## 2019-12-30 NOTE — Therapy (Signed)
Wanda Ross, [place], [address] Phone: [phone]   Fax:  [phone]  Speech Language Pathology Treatment  Patient Details  Name: Wanda Ross MRN: [id number] Date of Birth: 05-May-1951 Referring Provider (SLP): Wanda Rinne, PA-C   Encounter Date: 12/30/2019   End of Session - 12/30/19 1603    Visit Number 4    Number of Visits 8    Date for SLP Re-Evaluation 01/22/20    Authorization Type [employer]    SLP Start Time 1324    SLP Stop Time  1545    SLP Time Calculation (min) 71 min    Activity Tolerance Patient tolerated treatment well           Past Medical History:  Diagnosis Date  . Glaucoma   . Hyperlipidemia 2010  . Hypertension 1990  . IGT (impaired glucose tolerance) 05/11/2010  . Multinodular goiter 2010   normal function    Past Surgical History:  Procedure Laterality Date  . APPLICATION OF CRANIAL NAVIGATION Left 11/26/2019   Procedure: APPLICATION OF CRANIAL NAVIGATION;  Surgeon: Wanda Mare, MD;  Location: [facility];  Service: Neurosurgery;  Laterality: Left;  APPLICATION OF CRANIAL NAVIGATION  . COLONOSCOPY N/A 06/16/2016   Procedure: COLONOSCOPY;  Surgeon: Wanda Binder, MD;  [facility];  Service: Endoscopy;  Laterality: N/A;  9:30 AM  . COLONOSCOPY N/A 10/22/2019   Procedure: COLONOSCOPY;  Surgeon: Wanda Dolin, MD;  [facility];  Service: Endoscopy;  Laterality: N/A;  9:30  . CRANIOTOMY Left 11/26/2019   Procedure: LEFT PARIETAL CRANIOTOMY FOR RESECTION OF TUMOR;  Surgeon: Wanda Mare, MD;  Location: [facility];  Service: Neurosurgery;  Laterality: Left;  LEFT PARIETAL CRANIOTOMY FOR RESECTION OF TUMOR  . EYE SURGERY  2017   3 on left eye 1 on right eye for glaucoma by Dr. Venetia Ross   . EYE SURGERY  2018   Cataract Surgery by Dr. Arlina Ross   . OPERATIVE ULTRASOUND N/A 11/26/2019   Procedure: OPERATIVE ULTRASOUND;  Surgeon: Wanda Mare, MD;   Location: [facility];  Service: Neurosurgery;  Laterality: N/A;  OPERATIVE ULTRASOUND    There were no vitals filed for this visit.   Subjective Assessment - 12/30/19 1601    Subjective "I brought a notebook."    Patient is accompained by: Family member    Currently in Pain? No/denies             ADULT SLP TREATMENT - 12/30/19 1602      General Information   Behavior/Cognition Alert;Cooperative;Pleasant mood    Patient Positioning Upright in chair    Oral care provided N/A    HPI Wanda Ross is a 69 y.o. right-handed female with history of hyperlipidemia and hypertension.  Lives alone 1 level home 2 steps to entry.  Independent prior to admission.  She does have a daughter and 2 sons in the area with good support.  Presented 11/22/2019 with altered mental status and gait abnormality.  Admission chemistries with potassium 2.8, glucose 155, urinalysis negative nitrite.  CT/MRI showed contrast-enhancing mass within the left parietal white matter extending along the corpus callosum splenium most consistent with high-grade glioma.  Moderate surrounding hyperintense T2 weighted signal likely combination of edema and nonenhancing tumor with a 3 mm rightward midline shift.  CT of the chest and abdomen pelvis showed no acute findings.  Patient underwent left craniotomy for resection parietal and splenial mass 11/26/2019 per Dr. Roderic Palau  Ross. She attended inpatient rehab and was discharged home with a recommendation for outpatient therapies. She was referred to outpatient SLP therapy by Wanda Son, PA-C.       Treatment Provided   Treatment provided Cognitive-Linquistic      Pain Assessment   Pain Assessment No/denies pain      Cognitive-Linquistic Treatment   Treatment focused on Cognition;Patient/family/caregiver education    Skilled Treatment memory, self evaluation, visual scanning, following directions, planning/organization tasks      Assessment / Recommendations / Plan   Plan  Continue with current plan of care      Progression Toward Goals   Progression toward goals Progressing toward goals              SLP Short Term Goals - 12/30/19 1604      SLP SHORT TERM GOAL #1   Title Pt will implement memory strategies in functional therapy activities with 95% acc with min cues.    Baseline 75%    Time 4    Period Weeks    Status On-going    Target Date 01/22/20      SLP SHORT TERM GOAL #2   Title Pt will complete moderate-level thought organization and planning activities with 90% acc and min assist.    Baseline 80% but needs a lot of extra time    Time 4    Period Weeks    Status On-going    Target Date 01/22/20      SLP SHORT TERM GOAL #3   Title Pt will increase recall for requested information to 95% acc during functional memory exercises with use of compensatory strategies as needed when provided min cues.    Baseline 80%    Time 4    Period Weeks    Status On-going    Target Date 01/22/20      SLP SHORT TERM GOAL #4   Title Pt will complete moderately complex visual scanning activities during pencil/paper tasks with 90% acc with min assist and use of strategies as needed.    Baseline 70%    Time 4    Period Weeks    Status On-going    Target Date 01/22/20            SLP Long Term Goals - 12/30/19 1605      SLP LONG TERM GOAL #1   Title Same as short term goals            Plan - 12/30/19 1604    Clinical Impression Statement Pt was accompanied to therapy by her sister this date. She brought in her new notebook with folders. Homework was reviewed. In session, Pt completed deduction puzzle with mod assist from SLP for organizing information and making inferences. She continues to require extra time to complete all tasks. She needed reminders to use strategies to visually track information when completing a coding task (she tends to jump around and miss items). Pt given additional HEP and we will complete written planning task next  session.    Speech Therapy Frequency 2x / week    Duration 4 weeks    Potential to Achieve Goals Good    SLP Home Exercise Plan Pt will completed HEP as assigned to facilitate carryover of treatment strategies and techniques in home environment.    Consulted and Agree with Plan of Care Patient           Patient will benefit from skilled therapeutic intervention in order to improve the following  deficits and impairments:   Cognitive communication deficit    Problem List Patient Active Problem List   Diagnosis Date Noted  . Goals of care, counseling/discussion 12/15/2019  . Glioblastoma ([place])   . Hypoalbuminemia due to protein-calorie malnutrition ([place])   . Transaminitis   . Benign essential HTN   . Prediabetes   . Seizure prophylaxis   . Glioma ([place]) 12/02/2019  . Right sided weakness   . Dyslipidemia   . Essential hypertension   . Steroid-induced hyperglycemia   . Leucocytosis   . Brain mass 11/22/2019  . 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 Abnormal electrocardiogram (ECG) (EKG) 12/31/2017  . White coat syndrome with diagnosis of hypertension 12/31/2017  . Glaucoma 12/26/2013  . Vitamin D deficiency 05/27/2009  . IMPAIRED GLUCOSE TOLERANCE 05/27/2009  . OSTEOPENIA 05/27/2009  . Multiple thyroid nodules 11/06/2007  . Hyperlipidemia 11/06/2007  . Malignant hypertension 11/06/2007   Thank you,  Genene Churn, [place]  [facility] 12/30/2019, 4:11 PM  [place], [place], [address] Phone: [phone]   Fax:  [phone]   Name: Wanda Ross MRN: [id number] Date of Birth: 01-01-1951

## 2019-12-31 ENCOUNTER — Other Ambulatory Visit: Payer: Self-pay

## 2019-12-31 ENCOUNTER — Ambulatory Visit
Admission: RE | Admit: 2019-12-31 | Discharge: 2019-12-31 | Disposition: A | Discharge status: Home / Self Care | Payer: Medicare PPO | Source: Ambulatory Visit | Attending: Radiation Oncology | Admitting: Radiation Oncology

## 2019-12-31 DIAGNOSIS — Z510 Encounter for antineoplastic radiation therapy: Secondary | ICD-10-CM | POA: Diagnosis not present

## 2019-12-31 DIAGNOSIS — C713 Malignant neoplasm of parietal lobe: Secondary | ICD-10-CM | POA: Diagnosis not present

## 2020-01-01 ENCOUNTER — Encounter (HOSPITAL_COMMUNITY): Payer: Self-pay | Admitting: Speech Pathology

## 2020-01-01 ENCOUNTER — Ambulatory Visit
Admission: RE | Admit: 2020-01-01 | Discharge: 2020-01-01 | Disposition: A | Discharge status: Home / Self Care | Payer: Medicare PPO | Source: Ambulatory Visit | Attending: Radiation Oncology | Admitting: Radiation Oncology

## 2020-01-01 ENCOUNTER — Other Ambulatory Visit: Payer: Self-pay

## 2020-01-01 ENCOUNTER — Telehealth: Payer: Self-pay

## 2020-01-01 ENCOUNTER — Ambulatory Visit (HOSPITAL_COMMUNITY): Payer: Medicare PPO | Admitting: Speech Pathology

## 2020-01-01 DIAGNOSIS — Z510 Encounter for antineoplastic radiation therapy: Secondary | ICD-10-CM | POA: Diagnosis not present

## 2020-01-01 DIAGNOSIS — C713 Malignant neoplasm of parietal lobe: Secondary | ICD-10-CM | POA: Diagnosis not present

## 2020-01-01 DIAGNOSIS — R41841 Cognitive communication deficit: Secondary | ICD-10-CM | POA: Diagnosis not present

## 2020-01-01 NOTE — Telephone Encounter (Signed)
[id number] - TESTING RAMIPRIL TO PREVENT MEMORY LOSS IN PEOPLE WITH GLIOBLASTOMA  01/01/2020 14:28PM  OUTGOING TELEPHONE CALL: Outgoing call (< 5 minutes) to [facility] as a reminder of her upcoming lab appointment tomorrow, following radiation. I verified that I was speaking with the correct person. Wanda Ross is advised to arrive for radiation as usual and proceed to the lab for her study lab draw. Lab results will be reviewed on Monday, 01/05/20 following her scheduled treatment and Ramipril titrated based on lab results: Deedee verbalizes understanding. She isthanked for her time and willingness toparticipate and is encouraged to call for any questions or concerns.  Dionne Bucy. Sharlett Iles, BSN, RN, CIC 01/01/2020 2:32 PM

## 2020-01-01 NOTE — Therapy (Signed)
[facility] [place], [place], [address] Phone: [phone]   Fax:  [phone]  Speech Language Pathology Treatment  Patient Details  Name: Wanda Ross MRN: [id number] Date of Birth: December 08, 1950 Referring Provider (SLP): Lauraine Rinne, PA-C   Encounter Date: 01/01/2020   End of Session - 01/01/20 1003    Visit Number 5    Number of Visits 8    Date for SLP Re-Evaluation 01/22/20    Authorization Type [employer]    Authorization - Visit Number 4    Authorization - Number of Visits 8    SLP Start Time [id number]    SLP Stop Time  0930    SLP Time Calculation (min) 40 min    Activity Tolerance Patient tolerated treatment well           Past Medical History:  Diagnosis Date  . Glaucoma   . Hyperlipidemia 2010  . Hypertension 1990  . IGT (impaired glucose tolerance) 05/11/2010  . Multinodular goiter 2010   normal function    Past Surgical History:  Procedure Laterality Date  . APPLICATION OF CRANIAL NAVIGATION Left 11/26/2019   Procedure: APPLICATION OF CRANIAL NAVIGATION;  Surgeon: [name], MD;  Location: [facility];  Service: Neurosurgery;  Laterality: Left;  APPLICATION OF CRANIAL NAVIGATION  . COLONOSCOPY N/A 06/16/2016   Procedure: COLONOSCOPY;  Surgeon: Danie Binder, MD;  [facility];  Service: Endoscopy;  Laterality: N/A;  9:30 AM  . COLONOSCOPY N/A 10/22/2019   Procedure: COLONOSCOPY;  Surgeon: Daneil Dolin, MD;  [facility];  Service: Endoscopy;  Laterality: N/A;  9:30  . CRANIOTOMY Left 11/26/2019   Procedure: LEFT PARIETAL CRANIOTOMY FOR RESECTION OF TUMOR;  Surgeon: [name], MD;  Location: [facility];  Service: Neurosurgery;  Laterality: Left;  LEFT PARIETAL CRANIOTOMY FOR RESECTION OF TUMOR  . EYE SURGERY  2017   3 on left eye 1 on right eye for glaucoma by Dr. Venetia Maxon   . EYE SURGERY  2018   Cataract Surgery by Dr. Arlina Robes   . OPERATIVE ULTRASOUND N/A  11/26/2019   Procedure: OPERATIVE ULTRASOUND;  Surgeon: [name], MD;  Location: [facility];  Service: Neurosurgery;  Laterality: N/A;  OPERATIVE ULTRASOUND    There were no vitals filed for this visit.   Subjective Assessment - 01/01/20 1000    Subjective "I clipped the sections together." (notebook organization at home)    Currently in Pain? No/denies            ADULT SLP TREATMENT - 01/01/20 1001      General Information   Behavior/Cognition Alert;Cooperative;Pleasant mood    Patient Positioning Upright in chair    Oral care provided N/A    HPI Wanda Ross is a 69 y.o. right-handed female with history of hyperlipidemia and hypertension.  Lives alone 1 level home 2 steps to entry.  Independent prior to admission.  She does have a daughter and 2 sons in the area with good support.  Presented 11/22/2019 with altered mental status and gait abnormality.  Admission chemistries with potassium 2.8, glucose 155, urinalysis negative nitrite.  CT/MRI showed contrast-enhancing mass within the left parietal white matter extending along the corpus callosum splenium most consistent with high-grade glioma.  Moderate surrounding hyperintense T2 weighted signal likely combination of edema and nonenhancing tumor with a 3 mm rightward midline shift.  CT of the chest and abdomen pelvis showed no acute findings.  Patient underwent  left craniotomy for resection parietal and splenial mass 11/26/2019 per Dr. Duffy Rhody. She attended inpatient rehab and was discharged home with a recommendation for outpatient therapies. She was referred to outpatient SLP therapy by Virl Son, PA-C.       Treatment Provided   Treatment provided Cognitive-Linquistic      Pain Assessment   Pain Assessment No/denies pain      Cognitive-Linquistic Treatment   Treatment focused on Cognition;Patient/family/caregiver education    Skilled Treatment memory, self evaluation, visual scanning, following directions,  planning/organization tasks      Assessment / Recommendations / Plan   Plan Continue with current plan of care      Progression Toward Goals   Progression toward goals Progressing toward goals            SLP Education - 01/01/20 1002    Education Details SLP added sections to her notebook with tabs to help with organization, additonal HEP provided (visual coding)    Person(s) Educated Patient    Methods Explanation;Handout    Comprehension Verbalized understanding            SLP Short Term Goals - 01/01/20 1005      SLP SHORT TERM GOAL #1   Title Pt will implement memory strategies in functional therapy activities with 95% acc with min cues.    Baseline 75%    Time 4    Period Weeks    Status On-going    Target Date 01/22/20      SLP SHORT TERM GOAL #2   Title Pt will complete moderate-level thought organization and planning activities with 90% acc and min assist.    Baseline 80% but needs a lot of extra time    Time 4    Period Weeks    Status On-going    Target Date 01/22/20      SLP SHORT TERM GOAL #3   Title Pt will increase recall for requested information to 95% acc during functional memory exercises with use of compensatory strategies as needed when provided min cues.    Baseline 80%    Time 4    Period Weeks    Status On-going    Target Date 01/22/20      SLP SHORT TERM GOAL #4   Title Pt will complete moderately complex visual scanning activities during pencil/paper tasks with 90% acc with min assist and use of strategies as needed.    Baseline 70%    Time 4    Period Weeks    Status On-going    Target Date 01/22/20            SLP Long Term Goals - 01/01/20 1005      SLP LONG TERM GOAL #1   Title Same as short term goals            Plan - 01/01/20 1004    Clinical Impression Statement Pt completed HEP as assigned, however it took her an extensive amount of time to complete (28 minutes for first page of 4 items and 40 minutes for the  second page of 4 items). It was completed with 100% acc however. She had corrected all errors prior to coming to session. In session, she completed a scheduling/planning task ("Mary's Day") in 30 minutes with 80% acc. She was given written cue cards to use for assist, however she did not use them and seemed surprised when I asked her about them at the end (after having explained their function to  her in the start). SLP also added tabs to help organize her folder as this was not completed at home. She was also asked to leave the extra folders she brought at home to decrease clutter. She indicates that she feels that she is back to her baseline. SLP asked her to write the start and stop time on each homework page to help increase her awareness of current cognitive changes. She was given coding work for homework. Next session, will complete [place] task.    Speech Therapy Frequency 2x / week    Duration 4 weeks    Potential to Achieve Goals Good    SLP Home Exercise Plan Pt will completed HEP as assigned to facilitate carryover of treatment strategies and techniques in home environment.    Consulted and Agree with Plan of Care Patient           Patient will benefit from skilled therapeutic intervention in order to improve the following deficits and impairments:   Cognitive communication deficit    Problem List Patient Active Problem List   Diagnosis Date Noted  . Goals of care, counseling/discussion 12/15/2019  . Glioblastoma ([facility])   . Hypoalbuminemia due to protein-calorie malnutrition ([facility])   . Transaminitis   . Benign essential HTN   . Prediabetes   . Seizure prophylaxis   . Glioma ([place]) 12/02/2019  . Right sided weakness   . Dyslipidemia   . Essential hypertension   . Steroid-induced hyperglycemia   . Leucocytosis   . Brain mass 11/22/2019  . 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 Abnormal electrocardiogram (ECG) (EKG) 12/31/2017  . White coat syndrome with diagnosis of hypertension  12/31/2017  . Glaucoma 12/26/2013  . Vitamin D deficiency 05/27/2009  . IMPAIRED GLUCOSE TOLERANCE 05/27/2009  . OSTEOPENIA 05/27/2009  . Multiple thyroid nodules 11/06/2007  . Hyperlipidemia 11/06/2007  . Malignant hypertension 11/06/2007   Thank you,  Genene Churn, [place]  [facility] 01/01/2020, 10:06 AM  [facility] [address] [place], [place], [address] Phone: [phone]   Fax:  [phone]   Name: YESENNIA HIROTA MRN: [id number] Date of Birth: [birth year]

## 2020-01-02 ENCOUNTER — Other Ambulatory Visit: Payer: Self-pay

## 2020-01-02 ENCOUNTER — Ambulatory Visit
Admission: RE | Admit: 2020-01-02 | Discharge: 2020-01-02 | Disposition: A | Discharge status: Home / Self Care | Payer: Medicare PPO | Source: Ambulatory Visit | Attending: Radiation Oncology | Admitting: Radiation Oncology

## 2020-01-02 ENCOUNTER — Inpatient Hospital Stay: Payer: Medicare PPO

## 2020-01-02 DIAGNOSIS — C719 Malignant neoplasm of brain, unspecified: Secondary | ICD-10-CM

## 2020-01-02 DIAGNOSIS — Z510 Encounter for antineoplastic radiation therapy: Secondary | ICD-10-CM | POA: Diagnosis not present

## 2020-01-02 DIAGNOSIS — C713 Malignant neoplasm of parietal lobe: Secondary | ICD-10-CM | POA: Diagnosis not present

## 2020-01-02 LAB — CMP (CANCER CENTER ONLY)
ALT: 17 U/L (ref 0–44)
AST: 21 U/L (ref 15–41)
Albumin: 4 g/dL (ref 3.5–5.0)
Alkaline Phosphatase: 78 U/L (ref 38–126)
Anion gap: 9 (ref 5–15)
BUN: 20 mg/dL (ref 8–23)
CO2: 28 mmol/L (ref 22–32)
Calcium: 10.1 mg/dL (ref 8.9–10.3)
Chloride: 108 mmol/L (ref 98–111)
Creatinine: 0.75 mg/dL (ref 0.44–1.00)
GFR, Est AFR Am: >60 mL/min (ref >60)
GFR, Estimated: >60 mL/min (ref >60)
Glucose, Bld: 93 mg/dL (ref 70–99)
Potassium: 4.2 mmol/L (ref 3.5–5.1)
Sodium: 145 mmol/L (ref 135–145)
Total Bilirubin: 0.6 mg/dL (ref 0.3–1.2)
Total Protein: 6.9 g/dL (ref 6.5–8.1)

## 2020-01-02 LAB — CBC WITH DIFFERENTIAL (CANCER CENTER ONLY)
Abs Immature Granulocytes: 0.07 10*3/uL (ref 0.00–0.07)
Basophils Absolute: 0 10*3/uL (ref 0.0–0.1)
Basophils Relative: 0 %
Eosinophils Absolute: 0 10*3/uL (ref 0.0–0.5)
Eosinophils Relative: 1 %
HCT: 42 % (ref 36.0–46.0)
Hemoglobin: 14.1 g/dL (ref 12.0–15.0)
Immature Granulocytes: 1 %
Lymphocytes Relative: 22 %
Lymphs Abs: 1.6 10*3/uL (ref 0.7–4.0)
MCH: 28.5 pg (ref 26.0–34.0)
MCHC: 33.6 g/dL (ref 30.0–36.0)
MCV: 85 fL (ref 80.0–100.0)
Monocytes Absolute: 0.6 10*3/uL (ref 0.1–1.0)
Monocytes Relative: 8 %
Neutro Abs: 5.2 10*3/uL (ref 1.7–7.7)
Neutrophils Relative %: 68 %
Platelet Count: 354 10*3/uL (ref 150–400)
RBC: 4.94 MIL/uL (ref 3.87–5.11)
RDW: 14.1 % (ref 11.5–15.5)
WBC Count: 7.5 10*3/uL (ref 4.0–10.5)
nRBC: 0 % (ref 0.0–0.2)

## 2020-01-02 LAB — RESEARCH LABS

## 2020-01-02 MED ORDER — SONAFINE EX EMUL
1.0000 "application " | Freq: Two times a day (BID) | CUTANEOUS | Status: DC
  Administered 2020-01-02: 1 via TOPICAL

## 2020-01-02 NOTE — Research (Signed)
[id number] - TESTING RAMIPRIL TO PREVENT MEMORY LOSS IN PEOPLE WITH GLIOBLASTOMA  01/02/2020 10:45AM  Wanda Ross is at the cancer center today to complete her regular chemoradiation treatment; she is accompanied by her son today. Following her radiation treatment I am able to meet with Wanda Ross and her son briefly as a reminder to have her labs drawn before leaving today: both verbalize understanding. I remind Wanda Ross to bring her pill bottle and her medication diary with her on Monday (01/05/20): she verbalizes understanding. The current plan is to have labs drawn today and review the results on Monday to determine Ramipril dosing for week 2. Wanda Ross reports that she is, "Doing just fine," and she has no complaints today. Both Wanda Ross and her son are encouraged to call me for any needs or questions.  Wanda Ross. Wanda Ross, BSN, RN, CIC 01/02/2020 2:21 PM

## 2020-01-02 NOTE — Progress Notes (Signed)
Pt here for patient teaching.  Pt given Radiation and You booklet, skin care instructions and Sonafine.  Reviewed areas of pertinence such as fatigue, hair loss, nausea and vomiting, skin changes, headache and blurry vision . Pt able to give teach back of to pat skin and use unscented/gentle soap,apply Sonafine bid and avoid applying anything to skin within 4 hours of treatment. Pt demonstrated understanding of information given and will contact nursing with any questions or concerns.     [email or web address]

## 2020-01-05 ENCOUNTER — Encounter: Payer: Self-pay | Admitting: Internal Medicine

## 2020-01-05 ENCOUNTER — Ambulatory Visit (HOSPITAL_COMMUNITY): Payer: Medicare PPO | Admitting: Speech Pathology

## 2020-01-05 ENCOUNTER — Inpatient Hospital Stay: Payer: Medicare PPO

## 2020-01-05 ENCOUNTER — Other Ambulatory Visit: Payer: Self-pay

## 2020-01-05 ENCOUNTER — Ambulatory Visit
Admission: RE | Admit: 2020-01-05 | Discharge: 2020-01-05 | Disposition: A | Discharge status: Home / Self Care | Payer: Medicare PPO | Source: Ambulatory Visit | Attending: Radiation Oncology | Admitting: Radiation Oncology

## 2020-01-05 DIAGNOSIS — C719 Malignant neoplasm of brain, unspecified: Secondary | ICD-10-CM

## 2020-01-05 DIAGNOSIS — C713 Malignant neoplasm of parietal lobe: Secondary | ICD-10-CM | POA: Diagnosis not present

## 2020-01-05 DIAGNOSIS — Z510 Encounter for antineoplastic radiation therapy: Secondary | ICD-10-CM | POA: Diagnosis not present

## 2020-01-05 MED ORDER — INV-RAMIPRIL 1.25 MG CAP WF-1801 (#133): 2.5000 mg | ORAL_CAPSULE | Freq: Every day | ORAL | 0 refills | Status: DC

## 2020-01-05 NOTE — Research (Signed)
[id number] - TESTING RAMIPRIL TO PREVENT MEMORY LOSS IN PEOPLE WITH GLIOBLASTOMA  01/05/2020 10:00AM  Wanda Ross arrives today for her chemoradiation treatment and review of week 1 Ramipril labs for titration; she is accompanied by her sister, Wanda Ross today.  RAMIPRIL PICKUP: Bottle no. [id number] is submitted by the patient along with her drug dairy. The number of pills remaining in the provided bottle are #126 (as expected). The capsules were counted in the Pharmacy by Wanda Ross with this nurse present as a second witness.   RADIATION: Wanda Ross tolerates her treatment without complaint.  BLOOD PRESSURE: Following radiation, Wanda Ross and Wanda Ross are escorted to the radiation office for a blood pressure check. Today her initial blood pressure is 170/108 in the left arm. After remaining seated comfortably for one minute, her repeat B/P is 147/83. She states, "Every time I walk my blood pressure goes up but after a minute, it comes right back down."  MEDICATION REVIEW: Following her blood pressure check, Wanda Ross reviews her current medication list: there are no changes.  TOXICITY ASSESSMENT: As noted above, Wanda Ross's systolic blood pressure is well above 121mm/Hg. Per [facility] lab results (drawn 01/02/2020), her serum creatinine is WNL and her potassium remains less than 5.62mEq/L. Wanda Ross denies any side effects whatsoever and states she is doing "great." Wanda Ross has documented taking one Ramipril 1.25mg  capsule daily on her diary and as expected, there are 126 capsules remaining.  LABS: All [facility] lab values are WNL; the results are reviewed and signed by Dr. Mickeal Skinner.  RAMIPRIL DISTRIBUTION: Bottle No. [id number] containing #126 capsules of Ramipril 1.25mg , is returned to Wanda Ross along with her updated Ramipril administration schedule (she will start two capsules of Ramipril 1.25mg  daily, starting this evening) and her August medication diary. Wanda Ross is reminded to take two pills, starting this evening (with or  without food) and document the number of tablets taken on her diary. She is reminded not to leave the cancer center on Friday before having labs drawn: she verbalizes understanding. She is also reminded to bring her Ramipril bottle and her diary with her next Monday, 01/12/2020; both Wanda Ross and Wanda Ross verbalize understanding.  Upon completion of above activities, Wanda Ross and Wanda Ross are escorted to the elevator to the main entrance. Both are thanked for their time and Wanda Ross is thanked for her willingness to continue participation in the [id number] study. They are both encouraged to call me with any questions or issues.   Current plan is to draw the next CMP on Friday, 01/09/2020, following radiation so results can be used for week three dosing.  Wanda Ross, BSN, RN, CIC 01/05/2020 12:05 PM

## 2020-01-05 NOTE — Addendum Note (Signed)
Addended by: Gwyndolyn Kaufman on: 01/05/2020 08:58 PM   Modules accepted: Orders

## 2020-01-06 ENCOUNTER — Ambulatory Visit
Admission: RE | Admit: 2020-01-06 | Discharge: 2020-01-06 | Disposition: A | Discharge status: Home / Self Care | Payer: Medicare PPO | Source: Ambulatory Visit | Attending: Radiation Oncology | Admitting: Radiation Oncology

## 2020-01-06 ENCOUNTER — Ambulatory Visit (HOSPITAL_COMMUNITY): Payer: Medicare PPO | Admitting: Speech Pathology

## 2020-01-06 ENCOUNTER — Encounter (HOSPITAL_COMMUNITY): Payer: Self-pay | Admitting: Speech Pathology

## 2020-01-06 ENCOUNTER — Other Ambulatory Visit: Payer: Self-pay

## 2020-01-06 DIAGNOSIS — R41841 Cognitive communication deficit: Secondary | ICD-10-CM | POA: Diagnosis not present

## 2020-01-06 DIAGNOSIS — Z510 Encounter for antineoplastic radiation therapy: Secondary | ICD-10-CM | POA: Diagnosis not present

## 2020-01-06 DIAGNOSIS — C713 Malignant neoplasm of parietal lobe: Secondary | ICD-10-CM | POA: Diagnosis not present

## 2020-01-06 NOTE — Therapy (Signed)
[facility] [place], [place], [address] Phone: [phone]   Fax:  [phone]  Speech Language Pathology Treatment  Patient Details  Name: Wanda Ross MRN: [id number] Date of Birth: [birth year] Referring Provider (SLP): Lauraine Rinne, PA-C   Encounter Date: 01/06/2020   End of Session - 01/06/20 1528    Visit Number 6    Number of Visits 8    Date for SLP Re-Evaluation 01/22/20    Authorization Type [employer]    Authorization - Visit Number 5    Authorization - Number of Visits 8    SLP Start Time 2706    SLP Stop Time  1515    SLP Time Calculation (min) 45 min    Activity Tolerance Patient tolerated treatment well           Past Medical History:  Diagnosis Date  . Glaucoma   . Hyperlipidemia 2010  . Hypertension 1990  . IGT (impaired glucose tolerance) 05/11/2010  . Multinodular goiter 2010   normal function    Past Surgical History:  Procedure Laterality Date  . APPLICATION OF CRANIAL NAVIGATION Left 11/26/2019   Procedure: APPLICATION OF CRANIAL NAVIGATION;  Surgeon: [name], MD;  Location: [facility];  Service: Neurosurgery;  Laterality: Left;  APPLICATION OF CRANIAL NAVIGATION  . COLONOSCOPY N/A 06/16/2016   Procedure: COLONOSCOPY;  Surgeon: Danie Binder, MD;  [facility];  Service: Endoscopy;  Laterality: N/A;  9:30 AM  . COLONOSCOPY N/A 10/22/2019   Procedure: COLONOSCOPY;  Surgeon: Daneil Dolin, MD;  [facility];  Service: Endoscopy;  Laterality: N/A;  9:30  . CRANIOTOMY Left 11/26/2019   Procedure: LEFT PARIETAL CRANIOTOMY FOR RESECTION OF TUMOR;  Surgeon: [name], MD;  Location: [facility];  Service: Neurosurgery;  Laterality: Left;  LEFT PARIETAL CRANIOTOMY FOR RESECTION OF TUMOR  . EYE SURGERY  2017   3 on left eye 1 on right eye for glaucoma by Dr. Venetia Maxon   . EYE SURGERY  2018   Cataract Surgery by Dr. Arlina Robes   . OPERATIVE ULTRASOUND N/A  11/26/2019   Procedure: OPERATIVE ULTRASOUND;  Surgeon: [name], MD;  Location: [facility];  Service: Neurosurgery;  Laterality: N/A;  OPERATIVE ULTRASOUND    There were no vitals filed for this visit.   Subjective Assessment - 01/06/20 1509    Subjective "I find I do better when I can get a little nap in."    Currently in Pain? No/denies            ADULT SLP TREATMENT - 01/06/20 1523      General Information   Behavior/Cognition Alert;Cooperative;Pleasant mood    Patient Positioning Upright in chair    Oral care provided N/A    HPI Wanda Ross is a 69 y.o. right-handed female with history of hyperlipidemia and hypertension.  Lives alone 1 level home 2 steps to entry.  Independent prior to admission.  She does have a daughter and 2 sons in the area with good support.  Presented 11/22/2019 with altered mental status and gait abnormality.  Admission chemistries with potassium 2.8, glucose 155, urinalysis negative nitrite.  CT/MRI showed contrast-enhancing mass within the left parietal white matter extending along the corpus callosum splenium most consistent with high-grade glioma.  Moderate surrounding hyperintense T2 weighted signal likely combination of edema and nonenhancing tumor with a 3 mm rightward midline shift.  CT of the chest and abdomen pelvis showed no acute  findings.  Patient underwent left craniotomy for resection parietal and splenial mass 11/26/2019 per Dr. Duffy Rhody. She attended inpatient rehab and was discharged home with a recommendation for outpatient therapies. She was referred to outpatient SLP therapy by Virl Son, PA-C.       Treatment Provided   Treatment provided Cognitive-Linquistic      Pain Assessment   Pain Assessment No/denies pain      Cognitive-Linquistic Treatment   Treatment focused on Cognition;Patient/family/caregiver education    Skilled Treatment memory, self evaluation, visual scanning, following directions,  planning/organization tasks, reading comprehension      Assessment / Recommendations / Plan   Plan Continue with current plan of care      Progression Toward Goals   Progression toward goals Progressing toward goals            SLP Education - 01/06/20 1524    Education Details HEP given for reading comprehension and sequencing    Person(s) Educated Patient    Methods Explanation    Comprehension Verbalized understanding            SLP Short Term Goals - 01/06/20 1530      SLP SHORT TERM GOAL #1   Title Pt will implement memory strategies in functional therapy activities with 95% acc with min cues.    Baseline 75%    Time 4    Period Weeks    Status On-going    Target Date 01/22/20      SLP SHORT TERM GOAL #2   Title Pt will complete moderate-level thought organization and planning activities with 90% acc and min assist.    Baseline 80% but needs a lot of extra time    Time 4    Period Weeks    Status On-going    Target Date 01/22/20      SLP SHORT TERM GOAL #3   Title Pt will increase recall for requested information to 95% acc during functional memory exercises with use of compensatory strategies as needed when provided min cues.    Baseline 80%    Time 4    Period Weeks    Status On-going    Target Date 01/22/20      SLP SHORT TERM GOAL #4   Title Pt will complete moderately complex visual scanning activities during pencil/paper tasks with 90% acc with min assist and use of strategies as needed.    Baseline 70%    Time 4    Period Weeks    Status On-going    Target Date 01/22/20            SLP Long Term Goals - 01/06/20 1530      SLP LONG TERM GOAL #1   Title Same as short term goals            Plan - 01/06/20 1530    Clinical Impression Statement Pt completed all homework with 100% acc and recorded amount of time taken to complete (less time than previous tasks). She completed 10-item memory task with first trial 7/10, second trial 10/10, and  then was able to recall 8/10 without association cues. Memory strategies were reviewed with Pt with focus on attention to task and making associations. She completed the [place] task with moderate assistance and allowance for extra time. Pt continues to appear to have difficulty with visual tracking and benefits from use of an index card to track. She was given reading comprehension paragraphs for homework and a sequencing  task. Continue plan of care.   Speech Therapy Frequency 2x / week    Duration 4 weeks    Potential to Achieve Goals Good    SLP Home Exercise Plan Pt will completed HEP as assigned to facilitate carryover of treatment strategies and techniques in home environment.    Consulted and Agree with Plan of Care Patient           Patient will benefit from skilled therapeutic intervention in order to improve the following deficits and impairments:   Cognitive communication deficit    Problem List Patient Active Problem List   Diagnosis Date Noted  . Goals of care, counseling/discussion 12/15/2019  . Glioblastoma ([place])   . Hypoalbuminemia due to protein-calorie malnutrition ([place])   . Transaminitis   . Benign essential HTN   . Prediabetes   . Seizure prophylaxis   . Glioma ([place]) 12/02/2019  . Right sided weakness   . Dyslipidemia   . Essential hypertension   . Steroid-induced hyperglycemia   . Leucocytosis   . Brain mass 11/22/2019  . 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 Abnormal electrocardiogram (ECG) (EKG) 12/31/2017  . White coat syndrome with diagnosis of hypertension 12/31/2017  . Glaucoma 12/26/2013  . Vitamin D deficiency 05/27/2009  . IMPAIRED GLUCOSE TOLERANCE 05/27/2009  . OSTEOPENIA 05/27/2009  . Multiple thyroid nodules 11/06/2007  . Hyperlipidemia 11/06/2007  . Malignant hypertension 11/06/2007   Thank you,  Genene Churn, [place]  [facility] 01/06/2020, 3:31 PM  [facility] [address] [place], [place], [address] Phone: [phone]   Fax:  [phone]   Name: Wanda Ross MRN: [id number] Date of Birth: 04-24-1951

## 2020-01-07 ENCOUNTER — Ambulatory Visit
Admission: RE | Admit: 2020-01-07 | Discharge: 2020-01-07 | Disposition: A | Discharge status: Home / Self Care | Payer: Medicare PPO | Source: Ambulatory Visit | Attending: Radiation Oncology | Admitting: Radiation Oncology

## 2020-01-07 ENCOUNTER — Other Ambulatory Visit: Payer: Self-pay

## 2020-01-07 DIAGNOSIS — Z510 Encounter for antineoplastic radiation therapy: Secondary | ICD-10-CM | POA: Diagnosis not present

## 2020-01-07 DIAGNOSIS — C713 Malignant neoplasm of parietal lobe: Secondary | ICD-10-CM | POA: Diagnosis not present

## 2020-01-08 ENCOUNTER — Other Ambulatory Visit: Payer: Self-pay

## 2020-01-08 ENCOUNTER — Ambulatory Visit (HOSPITAL_COMMUNITY): Payer: Medicare PPO | Admitting: Speech Pathology

## 2020-01-08 ENCOUNTER — Encounter (HOSPITAL_COMMUNITY): Payer: Self-pay | Admitting: Speech Pathology

## 2020-01-08 ENCOUNTER — Ambulatory Visit
Admission: RE | Admit: 2020-01-08 | Discharge: 2020-01-08 | Disposition: A | Discharge status: Home / Self Care | Payer: Medicare PPO | Source: Ambulatory Visit | Attending: Radiation Oncology | Admitting: Radiation Oncology

## 2020-01-08 ENCOUNTER — Inpatient Hospital Stay: Payer: Medicare PPO | Admitting: Internal Medicine

## 2020-01-08 ENCOUNTER — Inpatient Hospital Stay: Payer: Medicare PPO

## 2020-01-08 VITALS — BP 181/96 | HR 104 | Temp 98.1°F | Resp 20 | Ht 68.0 in | Wt 151.0 lb

## 2020-01-08 DIAGNOSIS — R41841 Cognitive communication deficit: Secondary | ICD-10-CM | POA: Diagnosis not present

## 2020-01-08 DIAGNOSIS — Z510 Encounter for antineoplastic radiation therapy: Secondary | ICD-10-CM | POA: Diagnosis not present

## 2020-01-08 DIAGNOSIS — C713 Malignant neoplasm of parietal lobe: Secondary | ICD-10-CM | POA: Diagnosis not present

## 2020-01-08 DIAGNOSIS — C719 Malignant neoplasm of brain, unspecified: Secondary | ICD-10-CM

## 2020-01-08 LAB — CMP (CANCER CENTER ONLY)
ALT: 19 U/L (ref 0–44)
AST: 20 U/L (ref 15–41)
Albumin: 4.2 g/dL (ref 3.5–5.0)
Alkaline Phosphatase: 82 U/L (ref 38–126)
Anion gap: 9 (ref 5–15)
BUN: 16 mg/dL (ref 8–23)
CO2: 26 mmol/L (ref 22–32)
Calcium: 10.7 mg/dL — ABNORMAL HIGH (ref 8.9–10.3)
Chloride: 107 mmol/L (ref 98–111)
Creatinine: 0.82 mg/dL (ref 0.44–1.00)
GFR, Est AFR Am: >60 mL/min (ref >60)
GFR, Estimated: >60 mL/min (ref >60)
Glucose, Bld: 86 mg/dL (ref 70–99)
Potassium: 4.3 mmol/L (ref 3.5–5.1)
Sodium: 142 mmol/L (ref 135–145)
Total Bilirubin: 0.6 mg/dL (ref 0.3–1.2)
Total Protein: 7.5 g/dL (ref 6.5–8.1)

## 2020-01-08 LAB — CBC WITH DIFFERENTIAL (CANCER CENTER ONLY)
Abs Immature Granulocytes: 0.05 10*3/uL (ref 0.00–0.07)
Basophils Absolute: 0.1 10*3/uL (ref 0.0–0.1)
Basophils Relative: 1 %
Eosinophils Absolute: 0.1 10*3/uL (ref 0.0–0.5)
Eosinophils Relative: 1 %
HCT: 43.4 % (ref 36.0–46.0)
Hemoglobin: 14.5 g/dL (ref 12.0–15.0)
Immature Granulocytes: 1 %
Lymphocytes Relative: 22 %
Lymphs Abs: 1.8 10*3/uL (ref 0.7–4.0)
MCH: 28.5 pg (ref 26.0–34.0)
MCHC: 33.4 g/dL (ref 30.0–36.0)
MCV: 85.3 fL (ref 80.0–100.0)
Monocytes Absolute: 0.7 10*3/uL (ref 0.1–1.0)
Monocytes Relative: 8 %
Neutro Abs: 5.4 10*3/uL (ref 1.7–7.7)
Neutrophils Relative %: 67 %
Platelet Count: 386 10*3/uL (ref 150–400)
RBC: 5.09 MIL/uL (ref 3.87–5.11)
RDW: 13.9 % (ref 11.5–15.5)
WBC Count: 8 10*3/uL (ref 4.0–10.5)
nRBC: 0 % (ref 0.0–0.2)

## 2020-01-08 NOTE — Progress Notes (Signed)
[facility] [place],  [place] [id number] [phone]   Interval Evaluation  Date of Service: 01/08/20 Patient Name: Wanda Ross Patient MRN: [id number] Patient DOB: [birth year] Provider: Ventura Sellers, MD  Identifying Statement:  [name] is a 69 y.o. female with left frontal glioblastoma   Oncologic History: 11/26/19: Craniotomy, resection of L parietal mass with Dr. Marcello Moores; path is GBM  Biomarkers:  MGMT Unknown.  IDH 1/2 Wild type.  EGFR Unknown  TERT Unknown   Interval History:  Wanda Ross presents today for follow up, now having completed 2 weeks of IMRT and concurrent Temodar.  No issues with radiation or chemo.  Continues to function well, walking over 1 mile each evening for aerobic exercise.  No seizures or headaches.  Fatigue is stable and manageable with an evening nap.  H+P (12/15/19) Patient presented to medical attention in late June with several weeks of progressive symptoms, mainly characterized by gait impairment, confusion and disorientation.  CNS imaging was obtained which demonstrated 4-5cm mass in the left parietal region, consistent with likely brain brain tumor.  She underwent craniotomy and resection with Dr. Marcello Moores on 11/26/19.  Following surgery she developed some right sided weakness, but this recovered to essentially full strength following nearly 2 weeks course of inpatient rehab.  Currently, she is living at home with her sister, although prior to this she was living alone.  She maintains full functional status, no limitations at present time.  Retired middle school principal.  Medications: Current Outpatient Medications on File Prior to Visit  Medication Sig Dispense Refill  . acetaminophen (TYLENOL) 325 MG tablet Take 2 tablets (650 mg total) by mouth every 4 (four) hours as needed for mild pain (temp > 100.5).    Marland Kitchen alendronate (FOSAMAX) 70 MG tablet Take 1 tablet by mouth  once a week.    Marland Kitchen amLODipine (NORVASC) 5 MG tablet Take 1 tablet (5 mg total) by mouth daily. 90 tablet 1  . Calcium Carbonate-Vit D-Min (CALCIUM 1200) 1200-1000 MG-UNIT CHEW Chew 1,200 mg by mouth daily. 30 tablet 1  . cloNIDine (CATAPRES) 0.3 MG tablet TAKE 1 TABLET BY MOUTH EVERYDAY AT BEDTIME 90 tablet 0  . Cyanocobalamin 2500 MCG TABS Take 2,500 mcg by mouth daily. 30 tablet 0  . docusate sodium (COLACE) 100 MG capsule Take 1 capsule (100 mg total) by mouth 2 (two) times daily. 10 capsule 0  . dorzolamide-timolol (COSOPT) 22.3-6.8 MG/ML ophthalmic solution Place 1 drop into the right eye 2 (two) times daily.     . Investigational Ramipril 1.25 MG capsule [id number] Take 2 capsules (2.5 mg total) by mouth at bedtime for 7 days. Take at bedtime with or without food. Drink adequate water to avoid dehydration. Avoid salt substitutes that are high in potassium.  0  . Multiple Vitamins-Minerals (CENTRUM SILVER ULTRA WOMENS) TABS Take 1 tablet by mouth daily.     . ondansetron (ZOFRAN) 8 MG tablet Take 1 tablet 2 (two) times daily as needed for nausea/vomiting. May take 30-60 minutes prior to Temodar administration if nausea/vomiting occurs    . pravastatin (PRAVACHOL) 10 MG tablet TAKE 1 TABLET BY MOUTH DAILY *DOSE REDUCTION* 90 tablet 1  . ramipril (ALTACE) 1.25 MG capsule Take 1.25 mg by mouth as directed. Increase by one capsule weekly until you are taking 4 capsules daily    . temozolomide (TEMODAR) 100 MG capsule Take 100 mg by mouth daily. May take on an empty  stomach or at bedtime to decrease nausea & vomiting.    . temozolomide (TEMODAR) 20 MG capsule Take 20 mg by mouth daily. May take on an empty stomach or at bedtime to decrease nausea & vomiting.    . Investigational Ramipril 1.25 MG capsule [id number] Take 1 capsule (1.25 mg total) by mouth at bedtime for 7 days. Take at bedtime with or without food. Drink adequate water to avoid dehydration. Avoid salt substitutes that are high in potassium. 133  capsule 0   No current facility-administered medications on file prior to visit.    Allergies: No Known Allergies Past Medical History:  Past Medical History:  Diagnosis Date  . Glaucoma   . Hyperlipidemia 2010  . Hypertension 1990  . IGT (impaired glucose tolerance) 05/11/2010  . Multinodular goiter 2010   normal function   Past Surgical History:  Past Surgical History:  Procedure Laterality Date  . APPLICATION OF CRANIAL NAVIGATION Left 11/26/2019   Procedure: APPLICATION OF CRANIAL NAVIGATION;  Surgeon: [name], MD;  Location: [facility];  Service: Neurosurgery;  Laterality: Left;  APPLICATION OF CRANIAL NAVIGATION  . COLONOSCOPY N/A 06/16/2016   Procedure: COLONOSCOPY;  Surgeon: Danie Binder, MD;  [facility];  Service: Endoscopy;  Laterality: N/A;  9:30 AM  . COLONOSCOPY N/A 10/22/2019   Procedure: COLONOSCOPY;  Surgeon: Daneil Dolin, MD;  [facility];  Service: Endoscopy;  Laterality: N/A;  9:30  . CRANIOTOMY Left 11/26/2019   Procedure: LEFT PARIETAL CRANIOTOMY FOR RESECTION OF TUMOR;  Surgeon: [name], MD;  Location: [facility];  Service: Neurosurgery;  Laterality: Left;  LEFT PARIETAL CRANIOTOMY FOR RESECTION OF TUMOR  . EYE SURGERY  2017   3 on left eye 1 on right eye for glaucoma by Dr. Venetia Maxon   . EYE SURGERY  2018   Cataract Surgery by Dr. Arlina Robes   . OPERATIVE ULTRASOUND N/A 11/26/2019   Procedure: OPERATIVE ULTRASOUND;  Surgeon: [name], MD;  Location: [facility];  Service: Neurosurgery;  Laterality: N/A;  OPERATIVE ULTRASOUND   Social History:  Social History   Socioeconomic History  . Marital status: Widowed    Spouse name: Not on file  . Number of children: 3  . Years of education: Not on file  . Highest education level: Not on file  Occupational History  . Occupation: employed   Tobacco Use  . Smoking status: Never Smoker  . Smokeless tobacco: Never Used  Vaping Use  . Vaping Use: Never used  Substance and  Sexual Activity  . Alcohol use: No  . Drug use: No  . Sexual activity: Never    Birth control/protection: Post-menopausal  Other Topics Concern  . Not on file  Social History Narrative  . Not on file   Social Determinants of Health   Financial Resource Strain:   . Difficulty of Paying Living Expenses:   Food Insecurity:   . Worried About Charity fundraiser in the Last Year:   . Arboriculturist in the Last Year:   Transportation Needs:   . Film/video editor (Medical):   Marland Kitchen Lack of Transportation (Non-Medical):   Physical Activity:   . Days of Exercise per Week:   . Minutes of Exercise per Session:   Stress:   . Feeling of Stress :   Social Connections:   . Frequency of Communication with Friends and Family:   . Frequency of Social Gatherings with Friends and Family:   . Attends  Religious Services:   . Active Member of Clubs or Organizations:   . Attends Archivist Meetings:   Marland Kitchen Marital Status:   Intimate Partner Violence:   . Fear of Current or Ex-Partner:   . Emotionally Abused:   Marland Kitchen Physically Abused:   . Sexually Abused:    Family History:  Family History  Problem Relation Age of Onset  . Dementia Father   . Dementia Mother   . Diabetes Mother   . Hypertension Mother   . Breast cancer Sister   . Hypertension Sister   . Diabetes Brother     Review of Systems: Constitutional: Doesn't report fevers, chills or abnormal weight loss Eyes: Doesn't report blurriness of vision Ears, nose, mouth, throat, and face: Doesn't report sore throat Respiratory: Doesn't report cough, dyspnea or wheezes Cardiovascular: Doesn't report palpitation, chest discomfort  Gastrointestinal:  Doesn't report nausea, constipation, diarrhea GU: Doesn't report incontinence Skin: Doesn't report skin rashes Neurological: Per HPI Musculoskeletal: Doesn't report joint pain Behavioral/Psych: Doesn't report anxiety  Physical Exam: Vitals:   01/08/20 1111  BP: (!) 181/96    Pulse: (!) 104  Resp: 20  Temp: 98.1 F (36.7 C)  SpO2: 100%   KPS: 80. General: Alert, cooperative, pleasant, in no acute distress Head: Normal EENT: No conjunctival injection or scleral icterus.  Lungs: Resp effort normal Cardiac: Regular rate Abdomen: Non-distended abdomen Skin: No rashes cyanosis or petechiae. Extremities: No clubbing or edema  Neurologic Exam: Mental Status: Awake, alert, attentive to examiner. Oriented to self and environment. Language is fluent with intact comprehension.  Cranial Nerves: Visual acuity is grossly normal. Visual fields are full. Extra-ocular movements intact. No ptosis. Face is symmetric Motor: Tone and bulk are normal. Power is full in both arms and legs. Reflexes are symmetric, no pathologic reflexes present.  Sensory: Intact to light touch Gait: Modestly hemiparetic.   Labs: I have reviewed the data as listed    Component Value Date/Time   NA 142 01/08/2020 1044   K 4.3 01/08/2020 1044   CL 107 01/08/2020 1044   CO2 26 01/08/2020 1044   GLUCOSE 86 01/08/2020 1044   BUN 16 01/08/2020 1044   CREATININE 0.82 01/08/2020 1044   CREATININE 0.86 05/21/2019 0754   CALCIUM 10.7 (H) 01/08/2020 1044   PROT 7.5 01/08/2020 1044   ALBUMIN 4.2 01/08/2020 1044   AST 20 01/08/2020 1044   ALT 19 01/08/2020 1044   ALKPHOS 82 01/08/2020 1044   BILITOT 0.6 01/08/2020 1044   GFRNONAA >60 01/08/2020 1044   GFRNONAA 68 05/21/2019 0754   GFRAA >60 01/08/2020 1044   GFRAA 79 05/21/2019 0754   Lab Results  Component Value Date   WBC 8.0 01/08/2020   NEUTROABS 5.4 01/08/2020   HGB 14.5 01/08/2020   HCT 43.4 01/08/2020   MCV 85.3 01/08/2020   PLT 386 01/08/2020     Assessment/Plan Glioblastoma (HCC) [C71.9]  Conley Rolls Wyffels is clinically stable today, now having completed 2 weeks of IMRT and Temodar.  Continues to maintain functional independence.  No issues with Ramipril study drug, labs within normal limits.  We recommended  continuing with full 6 weeks course of intensity modulated radiation therapy and concurrent daily Temozolomide.  Radiation will be administered Mon-Fri over 6 weeks, Temodar will be dosed at 63m/m2 to be given daily over 42 days.  We reviewed side effects of temodar, including fatigue, nausea/vomiting, constipation, and cytopenias.  Chemotherapy should be held for the following:  ANC less than 1,000  Platelets less  than 100,000  LFT or creatinine greater than 2x ULN  If clinical concerns/contraindications develop  Ava JON will return to clinic in 2 weeks with labs for evaluation.   All questions were answered. The patient knows to call the clinic with any problems, questions or concerns. No barriers to learning were detected.  I have spent a total of 30 minutes of face-to-face and non-face-to-face time, excluding clinical staff time, preparing to see patient, ordering tests and/or medications, counseling the patient, and independently interpreting results and communicating results to the patient/family/caregiver    Ventura Sellers, MD Medical Director of Neuro-Oncology [facility] at [place] 01/08/20 11:21 AM

## 2020-01-08 NOTE — Therapy (Signed)
[facility] [place], [place], [address] Phone: [phone]   Fax:  [phone]  Speech Language Pathology Treatment  Patient Details  Name: Wanda Ross MRN: [id number] Date of Birth: [birth year] Referring Provider (SLP): Lauraine Rinne, PA-C   Encounter Date: 01/08/2020   End of Session - 01/08/20 0919    Visit Number 7    Number of Visits 9    Date for SLP Re-Evaluation 01/22/20    Authorization Type [employer]    Authorization - Visit Number 6    Authorization - Number of Visits 8    SLP Start Time 0900    SLP Stop Time  0940    SLP Time Calculation (min) 40 min    Activity Tolerance Patient tolerated treatment well           Past Medical History:  Diagnosis Date  . Glaucoma   . Hyperlipidemia 2010  . Hypertension 1990  . IGT (impaired glucose tolerance) 05/11/2010  . Multinodular goiter 2010   normal function    Past Surgical History:  Procedure Laterality Date  . APPLICATION OF CRANIAL NAVIGATION Left 11/26/2019   Procedure: APPLICATION OF CRANIAL NAVIGATION;  Surgeon: [name], MD;  Location: [facility];  Service: Neurosurgery;  Laterality: Left;  APPLICATION OF CRANIAL NAVIGATION  . COLONOSCOPY N/A 06/16/2016   Procedure: COLONOSCOPY;  Surgeon: Danie Binder, MD;  [facility];  Service: Endoscopy;  Laterality: N/A;  9:30 AM  . COLONOSCOPY N/A 10/22/2019   Procedure: COLONOSCOPY;  Surgeon: Daneil Dolin, MD;  [facility];  Service: Endoscopy;  Laterality: N/A;  9:30  . CRANIOTOMY Left 11/26/2019   Procedure: LEFT PARIETAL CRANIOTOMY FOR RESECTION OF TUMOR;  Surgeon: [name], MD;  Location: [facility];  Service: Neurosurgery;  Laterality: Left;  LEFT PARIETAL CRANIOTOMY FOR RESECTION OF TUMOR  . EYE SURGERY  2017   3 on left eye 1 on right eye for glaucoma by Dr. Venetia Maxon   . EYE SURGERY  2018   Cataract Surgery by Dr. Arlina Robes   . OPERATIVE ULTRASOUND N/A  11/26/2019   Procedure: OPERATIVE ULTRASOUND;  Surgeon: [name], MD;  Location: [facility];  Service: Neurosurgery;  Laterality: N/A;  OPERATIVE ULTRASOUND    There were no vitals filed for this visit.   Subjective Assessment - 01/08/20 0906    Subjective "I have to go to radiation after this."    Currently in Pain? No/denies            ADULT SLP TREATMENT - 01/08/20 0918      General Information   Behavior/Cognition Alert;Cooperative;Pleasant mood    Patient Positioning Upright in chair    Oral care provided N/A    HPI Wanda Ross is a 69 y.o. right-handed female with history of hyperlipidemia and hypertension.  Lives alone 1 level home 2 steps to entry.  Independent prior to admission.  She does have a daughter and 2 sons in the area with good support.  Presented 11/22/2019 with altered mental status and gait abnormality.  Admission chemistries with potassium 2.8, glucose 155, urinalysis negative nitrite.  CT/MRI showed contrast-enhancing mass within the left parietal white matter extending along the corpus callosum splenium most consistent with high-grade glioma.  Moderate surrounding hyperintense T2 weighted signal likely combination of edema and nonenhancing tumor with a 3 mm rightward midline shift.  CT of the chest and abdomen pelvis showed no acute findings.  Patient underwent left  craniotomy for resection parietal and splenial mass 11/26/2019 per Dr. Duffy Rhody. She attended inpatient rehab and was discharged home with a recommendation for outpatient therapies. She was referred to outpatient SLP therapy by Virl Son, PA-C.       Treatment Provided   Treatment provided Cognitive-Linquistic      Pain Assessment   Pain Assessment No/denies pain      Cognitive-Linquistic Treatment   Treatment focused on Cognition;Patient/family/caregiver education    Skilled Treatment memory, self evaluation, visual scanning, following directions, planning/organization tasks,  reading comprehension      Assessment / Recommendations / Plan   Plan Continue with current plan of care      Progression Toward Goals   Progression toward goals Progressing toward goals              SLP Short Term Goals - 01/08/20 0923      SLP SHORT TERM GOAL #1   Title Pt will implement memory strategies in functional therapy activities with 95% acc with min cues.    Baseline 75%    Time 4    Period Weeks    Status On-going    Target Date 01/22/20      SLP SHORT TERM GOAL #2   Title Pt will complete moderate-level thought organization and planning activities with 90% acc and min assist.    Baseline 80% but needs a lot of extra time    Time 4    Period Weeks    Status On-going    Target Date 01/22/20      SLP SHORT TERM GOAL #3   Title Pt will increase recall for requested information to 95% acc during functional memory exercises with use of compensatory strategies as needed when provided min cues.    Baseline 80%    Time 4    Period Weeks    Status On-going    Target Date 01/22/20      SLP SHORT TERM GOAL #4   Title Pt will complete moderately complex visual scanning activities during pencil/paper tasks with 90% acc with min assist and use of strategies as needed.    Baseline 70%    Time 4    Period Weeks    Status On-going    Target Date 01/22/20            SLP Long Term Goals - 01/08/20 0927      SLP LONG TERM GOAL #1   Title Same as short term goals            Plan - 01/08/20 3361    Clinical Impression Statement Pt completed all homework with 100% acc (sequencing and reading comprehension). She orally read a paragraph aloud in session with occasional line omissions, however she independently requested an index card to help her track for self correction. Memory tasks were completed with cues for making association strategies and she completed recall with a distractor with 86% acc. She completed a cross out scanning task with 100% acc and  independently checked her work. Clock drawing completed and she did not complete the right upper quadrant of the circle (#s 2-4), but otherwise number and hand placement accurate. Continue plan of care.    Speech Therapy Frequency 2x / week    Duration 4 weeks    Potential to Achieve Goals Good    SLP Home Exercise Plan Pt will completed HEP as assigned to facilitate carryover of treatment strategies and techniques in home environment.    Consulted and  Agree with Plan of Care Patient           Patient will benefit from skilled therapeutic intervention in order to improve the following deficits and impairments:   Cognitive communication deficit    Problem List Patient Active Problem List   Diagnosis Date Noted  . Goals of care, counseling/discussion 12/15/2019  . Glioblastoma ([place])   . Hypoalbuminemia due to protein-calorie malnutrition ([place])   . Transaminitis   . Benign essential HTN   . Prediabetes   . Seizure prophylaxis   . Glioma ([place]) 12/02/2019  . Right sided weakness   . Dyslipidemia   . Essential hypertension   . Steroid-induced hyperglycemia   . Leucocytosis   . Brain mass 11/22/2019  . 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 Abnormal electrocardiogram (ECG) (EKG) 12/31/2017  . White coat syndrome with diagnosis of hypertension 12/31/2017  . Glaucoma 12/26/2013  . Vitamin D deficiency 05/27/2009  . IMPAIRED GLUCOSE TOLERANCE 05/27/2009  . OSTEOPENIA 05/27/2009  . Multiple thyroid nodules 11/06/2007  . Hyperlipidemia 11/06/2007  . Malignant hypertension 11/06/2007   Thank you,  Genene Churn, [place]  [facility] 01/08/2020, 9:30 AM  [facility] [address] [place], [place], [address] Phone: [phone]   Fax:  [phone]   Name: Wanda Ross MRN: [id number] Date of Birth: 1950-06-14

## 2020-01-09 ENCOUNTER — Inpatient Hospital Stay: Payer: Medicare PPO

## 2020-01-09 ENCOUNTER — Ambulatory Visit
Admission: RE | Admit: 2020-01-09 | Discharge: 2020-01-09 | Disposition: A | Discharge status: Home / Self Care | Payer: Medicare PPO | Source: Ambulatory Visit | Attending: Radiation Oncology | Admitting: Radiation Oncology

## 2020-01-09 ENCOUNTER — Other Ambulatory Visit: Payer: Medicare PPO

## 2020-01-09 DIAGNOSIS — C719 Malignant neoplasm of brain, unspecified: Secondary | ICD-10-CM

## 2020-01-09 DIAGNOSIS — C713 Malignant neoplasm of parietal lobe: Secondary | ICD-10-CM | POA: Diagnosis not present

## 2020-01-09 DIAGNOSIS — Z510 Encounter for antineoplastic radiation therapy: Secondary | ICD-10-CM | POA: Diagnosis not present

## 2020-01-09 LAB — RESEARCH LABS

## 2020-01-09 NOTE — Research (Signed)
[id number] - TESTING RAMIPRIL TO PREVENT MEMORY LOSS IN PEOPLE WITH GLIOBLASTOMA  01/09/2020 10:30AM  Wanda Ross arrives today for her regular chemoradiation treatment; she is accompanied by her daughter Wanda Ross today. I remind Wanda Ross to have her labs drawn before leaving today: both she and Wanda Ross verbalize understanding. I also remind Wanda Ross to bring her pill bottle and her medication diary with her on Monday (01/12/20): she verbalizes understanding. The current plan is to have labs drawn today and review the results on Monday to determine Ramipril dosing for week 3. Wanda Ross has no complaints today and is tolerating all treatments and Ramipril well. Wanda Ross states that Wanda Ross has an antinausea medication prescribed that should be taken prior to temozolomide (Temodar). Although Wanda Ross has not had any nausea, she asks how long after the Temodar dose could Wanda Ross take the nausea medication should nausea occur. This nurse spoke with Leron Croak, PharmD and confirmed that it is ok to take the Zofran (even after taking Temodar) if nausea occurs, but administration is recommended prior to Temodar to prevent nausea, rather than trying to treat it. I relayed the information to Wanda Ross and Wanda Ross verbally while they were still here and both verbalize understanding of how to take Zofran and the rationale of taking it prior to Temodar if needed. Both Wanda Ross and Wanda Ross are encouraged to call me for any needs or questions. I will see Wanda Ross again on Monday.  Dionne Bucy. Sharlett Iles, BSN, RN, CIC 01/09/2020 10:57 AM

## 2020-01-12 ENCOUNTER — Inpatient Hospital Stay: Payer: Medicare PPO

## 2020-01-12 ENCOUNTER — Other Ambulatory Visit: Payer: Self-pay

## 2020-01-12 ENCOUNTER — Ambulatory Visit (HOSPITAL_COMMUNITY): Payer: Medicare PPO | Admitting: Speech Pathology

## 2020-01-12 ENCOUNTER — Ambulatory Visit
Admission: RE | Admit: 2020-01-12 | Discharge: 2020-01-12 | Disposition: A | Discharge status: Home / Self Care | Payer: Medicare PPO | Source: Ambulatory Visit | Attending: Radiation Oncology | Admitting: Radiation Oncology

## 2020-01-12 DIAGNOSIS — Z510 Encounter for antineoplastic radiation therapy: Secondary | ICD-10-CM | POA: Diagnosis not present

## 2020-01-12 DIAGNOSIS — C713 Malignant neoplasm of parietal lobe: Secondary | ICD-10-CM | POA: Diagnosis not present

## 2020-01-12 DIAGNOSIS — C719 Malignant neoplasm of brain, unspecified: Secondary | ICD-10-CM

## 2020-01-12 NOTE — Research (Signed)
[id number] - TESTING RAMIPRIL TO PREVENT MEMORY LOSS IN PEOPLE WITH GLIOBLASTOMA  01/12/2020 10:15AM  Wanda Ross arrives today for her chemoradiation treatment and review of week 2 Ramipril labs for titration; she is accompanied by her sister, Wanda Ross today.  RAMIPRIL PICKUP: Bottle no. [id number] is submitted by the patient along with her drug dairy. The number of pills remaining in the provided bottle are #112 (as expected). The capsules were counted in the Pharmacy by Wanda Ross with this nurse present as a second witness.   RADIATION: Wanda Ross tolerates her treatment without complaint.  BLOOD PRESSURE: Following radiation, Wanda Ross and Wanda Ross are escorted to the radiation conference room for a blood pressure check. Jesalyn's blood pressure is 165/89 today.  MEDICATION REVIEW: Tamre reviews her current med list and there are no medication or supplement changes noted.  LABS/TOXICITY ASSESSMENT: As noted above, Wanda Ross's systolic blood pressure is well above 113mm/Hg. Per [facility] lab results (drawn 01/09/2020), her serum creatinine is WNL but her potassium is 5.78mEq/L, exceeding the allowable limit of 5.68mEq/L. Per study protocol, since the potassium level is >5.68mEq/L, Azrielle will not be escalated further and the study agent is not returned. CTCAE v5.0 lists a potassium of 5.2mEq/L as a grade 1 adverse event; per protocol (section 10), Grade 1 and 2 adverse events will not be collected and therefore, not reported as an SAE. Dr. Mickeal Skinner is notified in person of the lab value and signs labs with no action (no new orders) at this time. Wanda Ross continues to deny any side effects and has no complaints. Wanda Ross has documented taking two Ramipril 1.25mg  capsules daily on her diary and as expected, there are 112 capsules remaining.  RAMIPRIL RETURN: Bottle No. [id number] containing #112 capsules of Ramipril 1.25mg , is returned to the pharmacy and documented by Raul Del as received for destruction/disposal.  STUDY  CONTINUATION: I advised [name] that she may come off-study completely or continue to participate by completing the regular visits, PROs and neurocognitive assessments. Wanda Ross would like to discuss study continuation with her daughter, Wanda Ross, before making any decisions. She is also advised that study lab specimens will no longer be required on Fridays since she is no longer taking the Ramipril: she verbalizes understanding. The current plan is to follow-up with Wanda Ross and Wanda Ross this Friday, following her chemoradiation treatment (Wanda Ross brings Wanda Ross on Fridays).  Upon completion of above activities, Wanda Ross and Wanda Ross are escorted to the elevator to the main entrance. Both are thanked for their time and continued participation in the WF-1801 study. They are both encouraged to call me with any questions or issues.   Current plan is to follow-up with Wanda Ross on Friday, 01/16/2020, following radiation to determine if she will continue study participation.  DATA SUBMISSION: The Off-Agent form is complete and submitted pending Karianna's decision on study continuation. [employer] has been notified that a second shipment of Ramipril will not be necessary.  Dionne Bucy. Sharlett Iles, BSN, RN, CIC 01/12/2020 12:58 PM

## 2020-01-13 ENCOUNTER — Encounter (HOSPITAL_COMMUNITY): Payer: Self-pay | Admitting: Speech Pathology

## 2020-01-13 ENCOUNTER — Other Ambulatory Visit: Payer: Self-pay

## 2020-01-13 ENCOUNTER — Ambulatory Visit (HOSPITAL_COMMUNITY): Payer: Medicare PPO | Admitting: Speech Pathology

## 2020-01-13 ENCOUNTER — Ambulatory Visit
Admission: RE | Admit: 2020-01-13 | Discharge: 2020-01-13 | Disposition: A | Discharge status: Home / Self Care | Payer: Medicare PPO | Source: Ambulatory Visit | Attending: Radiation Oncology | Admitting: Radiation Oncology

## 2020-01-13 DIAGNOSIS — R41841 Cognitive communication deficit: Secondary | ICD-10-CM | POA: Diagnosis not present

## 2020-01-13 DIAGNOSIS — Z510 Encounter for antineoplastic radiation therapy: Secondary | ICD-10-CM | POA: Diagnosis not present

## 2020-01-13 DIAGNOSIS — C713 Malignant neoplasm of parietal lobe: Secondary | ICD-10-CM | POA: Diagnosis not present

## 2020-01-13 NOTE — Therapy (Signed)
[place], [place], [address] Phone: [phone]   Fax:  [phone]  Speech Language Pathology Treatment  Patient Details  Name: Wanda Ross MRN: [id number] Date of Birth: 07-Jan-1951 Referring Provider (SLP): Lauraine Rinne, PA-C   Encounter Date: 01/13/2020   End of Session - 01/13/20 1530    Visit Number 8    Number of Visits 9    Date for SLP Re-Evaluation 01/22/20    Authorization Type [employer]    Authorization - Visit Number 6    Authorization - Number of Visits 8    SLP Start Time 9233    SLP Stop Time  1525    SLP Time Calculation (min) 55 min    Activity Tolerance Patient tolerated treatment well           Past Medical History:  Diagnosis Date  . Glaucoma   . Hyperlipidemia 2010  . Hypertension 1990  . IGT (impaired glucose tolerance) 05/11/2010  . Multinodular goiter 2010   normal function    Past Surgical History:  Procedure Laterality Date  . APPLICATION OF CRANIAL NAVIGATION Left 11/26/2019   Procedure: APPLICATION OF CRANIAL NAVIGATION;  Surgeon: [name], MD;  Location: [facility];  Service: Neurosurgery;  Laterality: Left;  APPLICATION OF CRANIAL NAVIGATION  . COLONOSCOPY N/A 06/16/2016   Procedure: COLONOSCOPY;  Surgeon: Danie Binder, MD;  [facility];  Service: Endoscopy;  Laterality: N/A;  9:30 AM  . COLONOSCOPY N/A 10/22/2019   Procedure: COLONOSCOPY;  Surgeon: Daneil Dolin, MD;  [facility];  Service: Endoscopy;  Laterality: N/A;  9:30  . CRANIOTOMY Left 11/26/2019   Procedure: LEFT PARIETAL CRANIOTOMY FOR RESECTION OF TUMOR;  Surgeon: [name], MD;  Location: [facility];  Service: Neurosurgery;  Laterality: Left;  LEFT PARIETAL CRANIOTOMY FOR RESECTION OF TUMOR  . EYE SURGERY  2017   3 on left eye 1 on right eye for glaucoma by Dr. Venetia Maxon   . EYE SURGERY  2018   Cataract Surgery by Dr. Arlina Robes   . OPERATIVE ULTRASOUND N/A  11/26/2019   Procedure: OPERATIVE ULTRASOUND;  Surgeon: [name], MD;  Location: [facility];  Service: Neurosurgery;  Laterality: N/A;  OPERATIVE ULTRASOUND    There were no vitals filed for this visit.   Subjective Assessment - 01/13/20 1527    Subjective "Could the radiation I had today make this hard?"    Currently in Pain? No/denies           ADULT SLP TREATMENT - 01/13/20 1529      General Information   Behavior/Cognition Alert;Cooperative;Pleasant mood    Patient Positioning Upright in chair    Oral care provided N/A    HPI Wanda Ross is a 69 y.o. right-handed female with history of hyperlipidemia and hypertension.  Lives alone 1 level home 2 steps to entry.  Independent prior to admission.  She does have a daughter and 2 sons in the area with good support.  Presented 11/22/2019 with altered mental status and gait abnormality.  Admission chemistries with potassium 2.8, glucose 155, urinalysis negative nitrite.  CT/MRI showed contrast-enhancing mass within the left parietal white matter extending along the corpus callosum splenium most consistent with high-grade glioma.  Moderate surrounding hyperintense T2 weighted signal likely combination of edema and nonenhancing tumor with a 3 mm rightward midline shift.  CT of the chest and abdomen pelvis showed no acute findings.  Patient underwent left  craniotomy for resection parietal and splenial mass 11/26/2019 per Dr. Duffy Rhody. She attended inpatient rehab and was discharged home with a recommendation for outpatient therapies. She was referred to outpatient SLP therapy by Virl Son, PA-C.       Treatment Provided   Treatment provided Cognitive-Linquistic      Pain Assessment   Pain Assessment No/denies pain      Cognitive-Linquistic Treatment   Treatment focused on Cognition;Patient/family/caregiver education    Skilled Treatment memory, self evaluation, visual scanning, following directions, planning/organization  tasks, attention, and subtraction problems      Assessment / Recommendations / Plan   Plan Continue with current plan of care      Progression Toward Goals   Progression toward goals Progressing toward goals            SLP Education - 01/13/20 1530    Education Details HEP for subtraction without regrouping    Person(s) Educated Patient    Methods Handout    Comprehension Verbalized understanding            SLP Short Term Goals - 01/13/20 1532      SLP SHORT TERM GOAL #1   Title Pt will implement memory strategies in functional therapy activities with 95% acc with min cues.    Baseline 75%    Time 4    Period Weeks    Status On-going    Target Date 01/22/20      SLP SHORT TERM GOAL #2   Title Pt will complete moderate-level thought organization and planning activities with 90% acc and min assist.    Baseline 80% but needs a lot of extra time    Time 4    Period Weeks    Status On-going    Target Date 01/22/20      SLP SHORT TERM GOAL #3   Title Pt will increase recall for requested information to 95% acc during functional memory exercises with use of compensatory strategies as needed when provided min cues.    Baseline 80%    Time 4    Period Weeks    Status On-going    Target Date 01/22/20      SLP SHORT TERM GOAL #4   Title Pt will complete moderately complex visual scanning activities during pencil/paper tasks with 90% acc with min assist and use of strategies as needed.    Baseline 70%    Time 4    Period Weeks    Status On-going    Target Date 01/22/20            SLP Long Term Goals - 01/08/20 0927      SLP LONG TERM GOAL #1   Title Same as short term goals            Plan - 01/13/20 1532    Clinical Impression Statement Pt completed word search puzzles for homework. In session, she completed Trails A and B with the following results: Trails A 81 seconds and Trails B 268 seconds (both impaired). She required mod assist to grasp the pattern of  "number, letter, number, letter", but did eventually get it with corrections. She was unable to complete serial 7s from 100 despite written cues. She could state the months of the year in reverse with mi/mod cues to stay on track. Pt continues to exhibit deficits in attention. She appeared more aware of her deficits today and asked whether it could be due to having had radiation earlier today. Plan for  one more SLP session and release Pt to home program, however should she be willing to return for additional therapy following completion of her cancer treatment, it might be beneficial. Would like to discuss with family next session.    Speech Therapy Frequency 2x / week    Potential to Achieve Goals Good    SLP Home Exercise Plan Pt will completed HEP as assigned to facilitate carryover of treatment strategies and techniques in home environment.    Consulted and Agree with Plan of Care Patient           Patient will benefit from skilled therapeutic intervention in order to improve the following deficits and impairments:   Cognitive communication deficit    Problem List Patient Active Problem List   Diagnosis Date Noted  . Goals of care, counseling/discussion 12/15/2019  . Glioblastoma ([place])   . Hypoalbuminemia due to protein-calorie malnutrition ([facility])   . Transaminitis   . Benign essential HTN   . Prediabetes   . Seizure prophylaxis   . Glioma ([facility]) 12/02/2019  . Right sided weakness   . Dyslipidemia   . Essential hypertension   . Steroid-induced hyperglycemia   . Leucocytosis   . Brain mass 11/22/2019  . 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 Abnormal electrocardiogram (ECG) (EKG) 12/31/2017  . White coat syndrome with diagnosis of hypertension 12/31/2017  . Glaucoma 12/26/2013  . Vitamin D deficiency 05/27/2009  . IMPAIRED GLUCOSE TOLERANCE 05/27/2009  . OSTEOPENIA 05/27/2009  . Multiple thyroid nodules 11/06/2007  . Hyperlipidemia 11/06/2007  . Malignant hypertension 11/06/2007    Thank you,  Genene Churn, [place]  [facility] 01/13/2020, 3:33 PM  [facility] [address] [place], [place], [address] Phone: [phone]   Fax:  [phone]   Name: Wanda Ross MRN: [id number] Date of Birth: [birth year]

## 2020-01-14 ENCOUNTER — Other Ambulatory Visit: Payer: Self-pay

## 2020-01-14 ENCOUNTER — Ambulatory Visit
Admission: RE | Admit: 2020-01-14 | Discharge: 2020-01-14 | Disposition: A | Discharge status: Home / Self Care | Payer: Medicare PPO | Source: Ambulatory Visit | Attending: Radiation Oncology | Admitting: Radiation Oncology

## 2020-01-14 DIAGNOSIS — Z510 Encounter for antineoplastic radiation therapy: Secondary | ICD-10-CM | POA: Diagnosis not present

## 2020-01-14 DIAGNOSIS — C713 Malignant neoplasm of parietal lobe: Secondary | ICD-10-CM | POA: Diagnosis not present

## 2020-01-15 ENCOUNTER — Other Ambulatory Visit: Payer: Medicare PPO

## 2020-01-15 ENCOUNTER — Other Ambulatory Visit: Payer: Self-pay

## 2020-01-15 ENCOUNTER — Ambulatory Visit (HOSPITAL_COMMUNITY): Payer: Medicare PPO | Admitting: Speech Pathology

## 2020-01-15 ENCOUNTER — Ambulatory Visit
Admission: RE | Admit: 2020-01-15 | Discharge: 2020-01-15 | Disposition: A | Discharge status: Home / Self Care | Payer: Medicare PPO | Source: Ambulatory Visit | Attending: Radiation Oncology | Admitting: Radiation Oncology

## 2020-01-15 ENCOUNTER — Encounter (HOSPITAL_COMMUNITY): Payer: Self-pay | Admitting: Speech Pathology

## 2020-01-15 DIAGNOSIS — R41841 Cognitive communication deficit: Secondary | ICD-10-CM

## 2020-01-15 DIAGNOSIS — C713 Malignant neoplasm of parietal lobe: Secondary | ICD-10-CM | POA: Diagnosis not present

## 2020-01-15 DIAGNOSIS — Z510 Encounter for antineoplastic radiation therapy: Secondary | ICD-10-CM | POA: Diagnosis not present

## 2020-01-15 NOTE — Therapy (Signed)
[place], [place], [address] Phone: [phone]   Fax:  [phone]  Speech Language Pathology Treatment  Patient Details  Name: Wanda Ross MRN: [id number] Date of Birth: 01-22-1951 Referring Provider (SLP): Lauraine Rinne, PA-C   Encounter Date: 01/15/2020   End of Session - 01/15/20 1147    Visit Number 9    Number of Visits 9    Date for SLP Re-Evaluation 01/22/20    Authorization Type [employer]    Authorization - Visit Number 8    Authorization - Number of Visits 8    SLP Start Time [id number]    SLP Stop Time  0932    SLP Time Calculation (min) 42 min    Activity Tolerance Patient tolerated treatment well           Past Medical History:  Diagnosis Date  . Glaucoma   . Hyperlipidemia 2010  . Hypertension 1990  . IGT (impaired glucose tolerance) 05/11/2010  . Multinodular goiter 2010   normal function    Past Surgical History:  Procedure Laterality Date  . APPLICATION OF CRANIAL NAVIGATION Left 11/26/2019   Procedure: APPLICATION OF CRANIAL NAVIGATION;  Surgeon: [name], MD;  Location: [facility];  Service: Neurosurgery;  Laterality: Left;  APPLICATION OF CRANIAL NAVIGATION  . COLONOSCOPY N/A 06/16/2016   Procedure: COLONOSCOPY;  Surgeon: Danie Binder, MD;  [facility];  Service: Endoscopy;  Laterality: N/A;  9:30 AM  . COLONOSCOPY N/A 10/22/2019   Procedure: COLONOSCOPY;  Surgeon: Daneil Dolin, MD;  [facility];  Service: Endoscopy;  Laterality: N/A;  9:30  . CRANIOTOMY Left 11/26/2019   Procedure: LEFT PARIETAL CRANIOTOMY FOR RESECTION OF TUMOR;  Surgeon: [name], MD;  Location: [facility];  Service: Neurosurgery;  Laterality: Left;  LEFT PARIETAL CRANIOTOMY FOR RESECTION OF TUMOR  . EYE SURGERY  2017   3 on left eye 1 on right eye for glaucoma by Dr. Venetia Maxon   . EYE SURGERY  2018   Cataract Surgery by Dr. Arlina Robes   . OPERATIVE ULTRASOUND N/A  11/26/2019   Procedure: OPERATIVE ULTRASOUND;  Surgeon: [name], MD;  Location: [facility];  Service: Neurosurgery;  Laterality: N/A;  OPERATIVE ULTRASOUND    There were no vitals filed for this visit.   Subjective Assessment - 01/15/20 1141    Subjective "I was able to do the math at home."    Currently in Pain? No/denies               ADULT SLP TREATMENT - 01/15/20 1144      General Information   Behavior/Cognition Alert;Cooperative;Pleasant mood    Patient Positioning Upright in chair    Oral care provided N/A    HPI AMIAYA MCNEELEY is a 69 y.o. right-handed female with history of hyperlipidemia and hypertension.  Lives alone 1 level home 2 steps to entry.  Independent prior to admission.  She does have a daughter and 2 sons in the area with good support.  Presented 11/22/2019 with altered mental status and gait abnormality.  Admission chemistries with potassium 2.8, glucose 155, urinalysis negative nitrite.  CT/MRI showed contrast-enhancing mass within the left parietal white matter extending along the corpus callosum splenium most consistent with high-grade glioma.  Moderate surrounding hyperintense T2 weighted signal likely combination of edema and nonenhancing tumor with a 3 mm rightward midline shift.  CT of the chest and abdomen pelvis showed no acute findings.  Patient underwent left craniotomy for resection parietal and splenial mass 11/26/2019 per Dr. Duffy Rhody. She attended inpatient rehab and was discharged home with a recommendation for outpatient therapies. She was referred to outpatient SLP therapy by Virl Son, PA-C.       Treatment Provided   Treatment provided Cognitive-Linquistic      Pain Assessment   Pain Assessment No/denies pain      Cognitive-Linquistic Treatment   Treatment focused on Cognition;Patient/family/caregiver education    Skilled Treatment memory, self evaluation, visual scanning, following directions, planning/organization tasks,  reading comprehension      Assessment / Recommendations / Plan   Plan Discharge SLP treatment due to (comment)      Progression Toward Goals   Progression toward goals Goals met, education completed, patient discharged from SLP            SLP Education - 01/15/20 1146    Education Details Provided handout of recommendations post discharge    Person(s) Educated Patient    Methods Handout    Comprehension Verbalized understanding            SLP Short Term Goals - 01/15/20 1155      SLP SHORT TERM GOAL #1   Title Pt will implement memory strategies in functional therapy activities with 95% acc with min cues.    Baseline 75%    Time 4    Period Weeks    Status Achieved    Target Date 01/22/20      SLP SHORT TERM GOAL #2   Title Pt will complete moderate-level thought organization and planning activities with 90% acc and min assist.    Baseline 80% but needs a lot of extra time    Time 4    Period Weeks    Status Partially Met    Target Date 01/22/20      SLP SHORT TERM GOAL #3   Title Pt will increase recall for requested information to 95% acc during functional memory exercises with use of compensatory strategies as needed when provided min cues.    Baseline 80%    Time 4    Period Weeks    Status Achieved    Target Date 01/22/20      SLP SHORT TERM GOAL #4   Title Pt will complete moderately complex visual scanning activities during pencil/paper tasks with 90% acc with min assist and use of strategies as needed.    Baseline 70%    Time 4    Period Weeks    Status Partially Met    Target Date 01/22/20            SLP Long Term Goals - 01/08/20 0927      SLP LONG TERM GOAL #1   Title Same as short term goals            Plan - 01/15/20 1155    Clinical Impression Statement Pt completed subtraction problems given for homework. She missed ~2 problems on each page in the upper right hand quadrant. SLP highlighted the missed items and asked Pt to complete,  but needed additional cueing to locate and complete. She continues to have difficulty with subtraction problems that require regrouping. We completed change making task with money in session and she completed with 100% acc when provided min cues. Pt attended 8/8 SLP sessions focusing on improving memory, attention, and visual processing. She reports feeling satisfied in her progress, however it should be noted that Pt continues to demonstrate reduced awareness  of deficits. She was given a handout of recommendations going forward to assist with attention and visual scanning at home. Pt is still going through radiation treatment. She would benefit from additional SLP services, however she reports feeling satisfied with her current level of function (and demonstrates reduced awareness of deficits). Will d/c from SLP services at this time to allow Pt to complete her cancer treatment and consider SLP treatment at a later time if Pt wishes.   Potential to Achieve Goals Good    SLP Home Exercise Plan Pt will completed HEP as assigned to facilitate carryover of treatment strategies and techniques in home environment.    Consulted and Agree with Plan of Care Patient           Patient will benefit from skilled therapeutic intervention in order to improve the following deficits and impairments:   Cognitive communication deficit    Problem List Patient Active Problem List   Diagnosis Date Noted  . Goals of care, counseling/discussion 12/15/2019  . Glioblastoma ([place])   . Hypoalbuminemia due to protein-calorie malnutrition ([place])   . Transaminitis   . Benign essential HTN   . Prediabetes   . Seizure prophylaxis   . Glioma ([place]) 12/02/2019  . Right sided weakness   . Dyslipidemia   . Essential hypertension   . Steroid-induced hyperglycemia   . Leucocytosis   . Brain mass 11/22/2019  . 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 Abnormal electrocardiogram (ECG) (EKG) 12/31/2017  . White coat syndrome with diagnosis  of hypertension 12/31/2017  . Glaucoma 12/26/2013  . Vitamin D deficiency 05/27/2009  . IMPAIRED GLUCOSE TOLERANCE 05/27/2009  . OSTEOPENIA 05/27/2009  . Multiple thyroid nodules 11/06/2007  . Hyperlipidemia 11/06/2007  . Malignant hypertension 11/06/2007   SPEECH THERAPY DISCHARGE SUMMARY  Visits from Start of Care: 9  Current functional level related to goals / functional outcomes: See above, partially met and met goals above   Remaining deficits: Deficits remain in awareness of deficits, attention, and visual processing   Education / Equipment: complete  Plan: Patient agrees to discharge.  Patient goals were not met. Patient is being discharged due to being pleased with the current functional level.  ?????         Thank you,  Genene Churn, [place]  [facility] 01/15/2020, 11:56 AM  [place] [address] [place], [place], [address] Phone: [phone]   Fax:  [phone]   Name: Wanda Ross MRN: [id number] Date of Birth: 1950-08-18

## 2020-01-16 ENCOUNTER — Other Ambulatory Visit: Payer: Medicare PPO

## 2020-01-16 ENCOUNTER — Ambulatory Visit
Admission: RE | Admit: 2020-01-16 | Discharge: 2020-01-16 | Disposition: A | Discharge status: Home / Self Care | Payer: Medicare PPO | Source: Ambulatory Visit | Attending: Radiation Oncology | Admitting: Radiation Oncology

## 2020-01-16 DIAGNOSIS — Z510 Encounter for antineoplastic radiation therapy: Secondary | ICD-10-CM | POA: Diagnosis not present

## 2020-01-16 DIAGNOSIS — C713 Malignant neoplasm of parietal lobe: Secondary | ICD-10-CM | POA: Diagnosis not present

## 2020-01-16 DIAGNOSIS — C719 Malignant neoplasm of brain, unspecified: Secondary | ICD-10-CM

## 2020-01-16 NOTE — Research (Signed)
[id number] - TESTING RAMIPRIL TO PREVENT MEMORY LOSS IN PEOPLE WITH GLIOBLASTOMA  01/16/2020 10:20AM  Wanda Ross arrives today for her chemoradiation treatment and a follow-up visit to determine whether or not she wishes to continue on the WF1801 study. She is accompanied by her daughter, Wanda Ross today.  OFF-STUDY: Wanda Ross wishes to withdraw consent from the WF1801 study. She verbalizes that she does not want to continue the study assessments nor will she complete the neurocognitive tests or forms. Wanda Ross and Wanda Ross understand that I will continue to assess Wanda Ross for any toxicity for 30 days after her last dose of Ramipril (last dose taken on 01/11/2020).   NEUROCOGNITIVE TESTS/PROs: Not completed: patient refuses.   LABS/TOXICITY ASSESSMENT: There are no new lab results at this time (last lab work was draw for Ramipril dosing, collected 01/09/2020). Wanda Ross denies any side effects.   STUDY WITHDRAWALCONTINUATION: The Off-Study form is completed in [employer] as required per protocol.   Wanda Ross are thanked for their time and willingness to participate in the WF-[id number] study. They are both encouraged to call me with any questions or issues.   The current plan is to check in with Wanda Ross for toxicity signs/symptoms during her next visit with Dr. Mickeal Skinner, scheduled for 01/22/2020.   Wanda Ross. Sharlett Iles, BSN, RN, CIC 01/16/2020 10:48 AM

## 2020-01-19 ENCOUNTER — Ambulatory Visit
Admission: RE | Admit: 2020-01-19 | Discharge: 2020-01-19 | Disposition: A | Discharge status: Home / Self Care | Payer: Medicare PPO | Source: Ambulatory Visit | Attending: Radiation Oncology | Admitting: Radiation Oncology

## 2020-01-19 ENCOUNTER — Other Ambulatory Visit: Payer: Self-pay

## 2020-01-19 DIAGNOSIS — Z510 Encounter for antineoplastic radiation therapy: Secondary | ICD-10-CM | POA: Diagnosis not present

## 2020-01-19 DIAGNOSIS — C713 Malignant neoplasm of parietal lobe: Secondary | ICD-10-CM | POA: Diagnosis not present

## 2020-01-19 MED FILL — TEMOZOLOMIDE 100 MG CAPS: 100 | 14 days supply | Qty: 14 | Fill #1

## 2020-01-19 MED FILL — TEMOZOLOMIDE 20 MG CAPS: 20 | 14 days supply | Qty: 14 | Fill #1

## 2020-01-20 ENCOUNTER — Ambulatory Visit
Admission: RE | Admit: 2020-01-20 | Discharge: 2020-01-20 | Disposition: A | Discharge status: Home / Self Care | Payer: Medicare PPO | Source: Ambulatory Visit | Attending: Radiation Oncology | Admitting: Radiation Oncology

## 2020-01-20 ENCOUNTER — Other Ambulatory Visit: Payer: Self-pay

## 2020-01-20 DIAGNOSIS — C713 Malignant neoplasm of parietal lobe: Secondary | ICD-10-CM | POA: Diagnosis not present

## 2020-01-20 DIAGNOSIS — Z510 Encounter for antineoplastic radiation therapy: Secondary | ICD-10-CM | POA: Diagnosis not present

## 2020-01-21 ENCOUNTER — Other Ambulatory Visit: Payer: Self-pay

## 2020-01-21 ENCOUNTER — Other Ambulatory Visit: Payer: Self-pay | Admitting: Radiation Therapy

## 2020-01-21 ENCOUNTER — Ambulatory Visit
Admission: RE | Admit: 2020-01-21 | Discharge: 2020-01-21 | Disposition: A | Discharge status: Home / Self Care | Payer: Medicare PPO | Source: Ambulatory Visit | Attending: Radiation Oncology | Admitting: Radiation Oncology

## 2020-01-21 DIAGNOSIS — Z510 Encounter for antineoplastic radiation therapy: Secondary | ICD-10-CM | POA: Diagnosis not present

## 2020-01-21 DIAGNOSIS — C713 Malignant neoplasm of parietal lobe: Secondary | ICD-10-CM | POA: Diagnosis not present

## 2020-01-22 ENCOUNTER — Other Ambulatory Visit: Payer: Self-pay

## 2020-01-22 ENCOUNTER — Inpatient Hospital Stay: Payer: Medicare PPO

## 2020-01-22 ENCOUNTER — Inpatient Hospital Stay: Payer: Medicare PPO | Admitting: Internal Medicine

## 2020-01-22 ENCOUNTER — Ambulatory Visit
Admission: RE | Admit: 2020-01-22 | Discharge: 2020-01-22 | Disposition: A | Discharge status: Home / Self Care | Payer: Medicare PPO | Source: Ambulatory Visit | Attending: Radiation Oncology | Admitting: Radiation Oncology

## 2020-01-22 VITALS — BP 181/81 | HR 89 | Temp 97.9°F | Resp 17 | Ht 68.0 in | Wt 150.9 lb

## 2020-01-22 DIAGNOSIS — C719 Malignant neoplasm of brain, unspecified: Secondary | ICD-10-CM

## 2020-01-22 DIAGNOSIS — C713 Malignant neoplasm of parietal lobe: Secondary | ICD-10-CM | POA: Diagnosis not present

## 2020-01-22 DIAGNOSIS — Z510 Encounter for antineoplastic radiation therapy: Secondary | ICD-10-CM | POA: Diagnosis not present

## 2020-01-22 LAB — CMP (CANCER CENTER ONLY)
ALT: 11 U/L (ref 0–44)
AST: 17 U/L (ref 15–41)
Albumin: 4.4 g/dL (ref 3.5–5.0)
Alkaline Phosphatase: 71 U/L (ref 38–126)
Anion gap: 6 (ref 5–15)
BUN: 16 mg/dL (ref 8–23)
CO2: 33 mmol/L — ABNORMAL HIGH (ref 22–32)
Calcium: 11.1 mg/dL — ABNORMAL HIGH (ref 8.9–10.3)
Chloride: 103 mmol/L (ref 98–111)
Creatinine: 0.84 mg/dL (ref 0.44–1.00)
GFR, Est AFR Am: >60 mL/min (ref >60)
GFR, Estimated: >60 mL/min (ref >60)
Glucose, Bld: 95 mg/dL (ref 70–99)
Potassium: 4.8 mmol/L (ref 3.5–5.1)
Sodium: 142 mmol/L (ref 135–145)
Total Bilirubin: 0.7 mg/dL (ref 0.3–1.2)
Total Protein: 7.3 g/dL (ref 6.5–8.1)

## 2020-01-22 LAB — CBC WITH DIFFERENTIAL (CANCER CENTER ONLY)
Abs Immature Granulocytes: 0.02 10*3/uL (ref 0.00–0.07)
Basophils Absolute: 0 10*3/uL (ref 0.0–0.1)
Basophils Relative: 0 %
Eosinophils Absolute: 0.2 10*3/uL (ref 0.0–0.5)
Eosinophils Relative: 3 %
HCT: 44.8 % (ref 36.0–46.0)
Hemoglobin: 14.9 g/dL (ref 12.0–15.0)
Immature Granulocytes: 0 %
Lymphocytes Relative: 16 %
Lymphs Abs: 1.1 10*3/uL (ref 0.7–4.0)
MCH: 29.1 pg (ref 26.0–34.0)
MCHC: 33.3 g/dL (ref 30.0–36.0)
MCV: 87.5 fL (ref 80.0–100.0)
Monocytes Absolute: 0.6 10*3/uL (ref 0.1–1.0)
Monocytes Relative: 9 %
Neutro Abs: 4.6 10*3/uL (ref 1.7–7.7)
Neutrophils Relative %: 72 %
Platelet Count: 329 10*3/uL (ref 150–400)
RBC: 5.12 MIL/uL — ABNORMAL HIGH (ref 3.87–5.11)
RDW: 13.9 % (ref 11.5–15.5)
WBC Count: 6.5 10*3/uL (ref 4.0–10.5)
nRBC: 0 % (ref 0.0–0.2)

## 2020-01-22 NOTE — Progress Notes (Signed)
[facility] at [place], [place] [address] [phone]   Interval Evaluation  Date of Service: 01/22/20 Patient Name: Wanda Ross Patient MRN: [id number] Patient DOB: Oct 17, 1950 Provider: Ventura Sellers, MD  Identifying Statement:  Wanda Ross is a 69 y.o. female with left frontal glioblastoma   Oncologic History: 11/26/19: Craniotomy, resection of L parietal mass with Dr. Marcello Moores; path is GBM  Biomarkers:  MGMT Unknown.  IDH 1/2 Wild type.  EGFR Unknown  TERT Unknown   Interval History:  Wanda Ross presents today for follow up, now having completed 4 weeks of IMRT and concurrent Temodar.  No further issues tolerating chemotherapy, radiation has been smooth.  Continues to walk a mile daily for aerobic exercise.  No seizures or headaches.  Fatigue is stable and manageable with a nap following RT session.  H+P (12/15/19) Patient presented to medical attention in late June with several weeks of progressive symptoms, mainly characterized by gait impairment, confusion and disorientation.  CNS imaging was obtained which demonstrated 4-5cm mass in the left parietal region, consistent with likely brain brain tumor.  She underwent craniotomy and resection with Dr. Marcello Moores on 11/26/19.  Following surgery she developed some right sided weakness, but this recovered to essentially full strength following nearly 2 weeks course of inpatient rehab.  Currently, she is living at home with her sister, although prior to this she was living alone.  She maintains full functional status, no limitations at present time.  Retired middle school principal.  Medications: Current Outpatient Medications on File Prior to Visit  Medication Sig Dispense Refill  . acetaminophen (TYLENOL) 325 MG tablet Take 2 tablets (650 mg total) by mouth every 4 (four) hours as needed for mild pain (temp > 100.5).    Marland Kitchen alendronate (FOSAMAX) 70 MG tablet Take 1  tablet by mouth once a week.    Marland Kitchen amLODipine (NORVASC) 5 MG tablet Take 1 tablet (5 mg total) by mouth daily. 90 tablet 1  . Calcium Carbonate-Vit D-Min (CALCIUM 1200) 1200-1000 MG-UNIT CHEW Chew 1,200 mg by mouth daily. 30 tablet 1  . cloNIDine (CATAPRES) 0.3 MG tablet TAKE 1 TABLET BY MOUTH EVERYDAY AT BEDTIME 90 tablet 0  . Cyanocobalamin 2500 MCG TABS Take 2,500 mcg by mouth daily. 30 tablet 0  . docusate sodium (COLACE) 100 MG capsule Take 1 capsule (100 mg total) by mouth 2 (two) times daily. 10 capsule 0  . dorzolamide-timolol (COSOPT) 22.3-6.8 MG/ML ophthalmic solution Place 1 drop into the right eye 2 (two) times daily.     . Multiple Vitamins-Minerals (CENTRUM SILVER ULTRA WOMENS) TABS Take 1 tablet by mouth daily.     . ondansetron (ZOFRAN) 8 MG tablet Take 1 tablet 2 (two) times daily as needed for nausea/vomiting. May take 30-60 minutes prior to Temodar administration if nausea/vomiting occurs    . pravastatin (PRAVACHOL) 10 MG tablet TAKE 1 TABLET BY MOUTH DAILY *DOSE REDUCTION* 90 tablet 1  . ramipril (ALTACE) 1.25 MG capsule Take 1.25 mg by mouth as directed. Increase by one capsule weekly until you are taking 4 capsules daily    . temozolomide (TEMODAR) 100 MG capsule Take 100 mg by mouth daily. May take on an empty stomach or at bedtime to decrease nausea & vomiting.    . temozolomide (TEMODAR) 20 MG capsule Take 20 mg by mouth daily. May take on an empty stomach or at bedtime to decrease nausea & vomiting.     No  current facility-administered medications on file prior to visit.    Allergies: No Known Allergies Past Medical History:  Past Medical History:  Diagnosis Date  . Glaucoma   . Hyperlipidemia 2010  . Hypertension 1990  . IGT (impaired glucose tolerance) 05/11/2010  . Multinodular goiter 2010   normal function   Past Surgical History:  Past Surgical History:  Procedure Laterality Date  . APPLICATION OF CRANIAL NAVIGATION Left 11/26/2019   Procedure:  APPLICATION OF CRANIAL NAVIGATION;  Surgeon: [name], MD;  Location: [facility];  Service: Neurosurgery;  Laterality: Left;  APPLICATION OF CRANIAL NAVIGATION  . COLONOSCOPY N/A 06/16/2016   Procedure: COLONOSCOPY;  Surgeon: Danie Binder, MD;  [facility];  Service: Endoscopy;  Laterality: N/A;  9:30 AM  . COLONOSCOPY N/A 10/22/2019   Procedure: COLONOSCOPY;  Surgeon: Daneil Dolin, MD;  [facility];  Service: Endoscopy;  Laterality: N/A;  9:30  . CRANIOTOMY Left 11/26/2019   Procedure: LEFT PARIETAL CRANIOTOMY FOR RESECTION OF TUMOR;  Surgeon: [name], MD;  Location: [facility];  Service: Neurosurgery;  Laterality: Left;  LEFT PARIETAL CRANIOTOMY FOR RESECTION OF TUMOR  . EYE SURGERY  2017   3 on left eye 1 on right eye for glaucoma by Dr. Venetia Maxon   . EYE SURGERY  2018   Cataract Surgery by Dr. Arlina Robes   . OPERATIVE ULTRASOUND N/A 11/26/2019   Procedure: OPERATIVE ULTRASOUND;  Surgeon: [name], MD;  Location: [facility];  Service: Neurosurgery;  Laterality: N/A;  OPERATIVE ULTRASOUND   Social History:  Social History   Socioeconomic History  . Marital status: Widowed    Spouse name: Not on file  . Number of children: 3  . Years of education: Not on file  . Highest education level: Not on file  Occupational History  . Occupation: employed   Tobacco Use  . Smoking status: Never Smoker  . Smokeless tobacco: Never Used  Vaping Use  . Vaping Use: Never used  Substance and Sexual Activity  . Alcohol use: No  . Drug use: No  . Sexual activity: Never    Birth control/protection: Post-menopausal  Other Topics Concern  . Not on file  Social History Narrative  . Not on file   Social Determinants of Health   Financial Resource Strain:   . Difficulty of Paying Living Expenses: Not on file  Food Insecurity:   . Worried About Charity fundraiser in the Last Year: Not on file  . Ran Out of Food in the Last Year: Not on file  Transportation  Needs:   . Lack of Transportation (Medical): Not on file  . Lack of Transportation (Non-Medical): Not on file  Physical Activity:   . Days of Exercise per Week: Not on file  . Minutes of Exercise per Session: Not on file  Stress:   . Feeling of Stress : Not on file  Social Connections:   . Frequency of Communication with Friends and Family: Not on file  . Frequency of Social Gatherings with Friends and Family: Not on file  . Attends Religious Services: Not on file  . Active Member of Clubs or Organizations: Not on file  . Attends Archivist Meetings: Not on file  . Marital Status: Not on file  Intimate Partner Violence:   . Fear of Current or Ex-Partner: Not on file  . Emotionally Abused: Not on file  . Physically Abused: Not on file  . Sexually Abused: Not on  file   Family History:  Family History  Problem Relation Age of Onset  . Dementia Father   . Dementia Mother   . Diabetes Mother   . Hypertension Mother   . Breast cancer Sister   . Hypertension Sister   . Diabetes Brother     Review of Systems: Constitutional: Doesn't report fevers, chills or abnormal weight loss Eyes: Doesn't report blurriness of vision Ears, nose, mouth, throat, and face: Doesn't report sore throat Respiratory: Doesn't report cough, dyspnea or wheezes Cardiovascular: Doesn't report palpitation, chest discomfort  Gastrointestinal:  Doesn't report nausea, constipation, diarrhea GU: Doesn't report incontinence Skin: Doesn't report skin rashes Neurological: Per HPI Musculoskeletal: Doesn't report joint pain Behavioral/Psych: Doesn't report anxiety  Physical Exam: Vitals:   01/22/20 1125  BP: (!) 181/81  Pulse: 89  Resp: 17  Temp: 97.9 F (36.6 C)  SpO2: 100%   KPS: 80. General: Alert, cooperative, pleasant, in no acute distress Head: Normal EENT: No conjunctival injection or scleral icterus.  Lungs: Resp effort normal Cardiac: Regular rate Abdomen: Non-distended  abdomen Skin: No rashes cyanosis or petechiae. Extremities: No clubbing or edema  Neurologic Exam: Mental Status: Awake, alert, attentive to examiner. Oriented to self and environment. Language is fluent with intact comprehension.  Cranial Nerves: Visual acuity is grossly normal. Visual fields are full. Extra-ocular movements intact. No ptosis. Face is symmetric Motor: Tone and bulk are normal. Power is full in both arms and legs. Reflexes are symmetric, no pathologic reflexes present.  Sensory: Intact to light touch Gait: Modestly hemiparetic.   Labs: I have reviewed the data as listed    Component Value Date/Time   NA 142 01/08/2020 1044   K 4.3 01/08/2020 1044   CL 107 01/08/2020 1044   CO2 26 01/08/2020 1044   GLUCOSE 86 01/08/2020 1044   BUN 16 01/08/2020 1044   CREATININE 0.82 01/08/2020 1044   CREATININE 0.86 05/21/2019 0754   CALCIUM 10.7 (H) 01/08/2020 1044   PROT 7.5 01/08/2020 1044   ALBUMIN 4.2 01/08/2020 1044   AST 20 01/08/2020 1044   ALT 19 01/08/2020 1044   ALKPHOS 82 01/08/2020 1044   BILITOT 0.6 01/08/2020 1044   GFRNONAA >60 01/08/2020 1044   GFRNONAA 68 05/21/2019 0754   GFRAA >60 01/08/2020 1044   GFRAA 79 05/21/2019 0754   Lab Results  Component Value Date   WBC 6.5 01/22/2020   NEUTROABS 4.6 01/22/2020   HGB 14.9 01/22/2020   HCT 44.8 01/22/2020   MCV 87.5 01/22/2020   PLT 329 01/22/2020     Assessment/Plan No primary diagnosis found.  Wanda Ross is clinically stable today.  She has now completed 4 weeks of IMRT and Temodar.  No changes to functional status.  No longer on Ramipril study drug due to potassium bump.  We recommended continuing with full 6 weeks course of intensity modulated radiation therapy and concurrent daily Temozolomide.  Radiation will be administered Mon-Fri over 6 weeks, Temodar will be dosed at 55m/m2 to be given daily over 42 days.  We reviewed side effects of temodar, including fatigue, nausea/vomiting,  constipation, and cytopenias.  Chemotherapy should be held for the following:  ANC less than 1,000  Platelets less than 100,000  LFT or creatinine greater than 2x ULN  If clinical concerns/contraindications develop  No changes to medications today.  Labs were within normal limits.  Wanda Ross return to clinic in 2 weeks with labs for evaluation during week 6 of RT.   All questions  were answered. The patient knows to call the clinic with any problems, questions or concerns. No barriers to learning were detected.  I have spent a total of 30 minutes of face-to-face and non-face-to-face time, excluding clinical staff time, preparing to see patient, ordering tests and/or medications, counseling the patient, and independently interpreting results and communicating results to the patient/family/caregiver    Ventura Sellers, MD Medical Director of Neuro-Oncology [facility] at [place] 01/22/20 11:38 AM

## 2020-01-22 NOTE — Research (Signed)
[id number] - TESTING RAMIPRIL TO PREVENT MEMORY LOSS IN PEOPLE WITH GLIOBLASTOMA  01/22/2020 10:20AM  TOXICITY ASSESSMENT: Wanda Ross arrives today for her chemoradiation treatment and I have an opportunity to speak with her. She states she is "doing well" and denies any complaints or side effects. There are no medication changes noted. I will continue to follow [facility] for 30 days after her last dose of Ramipril (last dose taken on 01/11/2020) for reported toxicities.   LABS: No labs are drawn today.  The current plan is to check in with [facility] for toxicity signs/symptoms during her next visit with Dr. Mickeal Skinner, scheduled for 02/05/2020.   Dionne Bucy. Sharlett Iles, BSN, RN, CIC 01/22/2020 10:29 AM

## 2020-01-23 ENCOUNTER — Other Ambulatory Visit: Payer: Medicare PPO

## 2020-01-23 ENCOUNTER — Other Ambulatory Visit: Payer: Self-pay

## 2020-01-23 ENCOUNTER — Ambulatory Visit
Admission: RE | Admit: 2020-01-23 | Discharge: 2020-01-23 | Disposition: A | Discharge status: Home / Self Care | Payer: Medicare PPO | Source: Ambulatory Visit | Attending: Radiation Oncology | Admitting: Radiation Oncology

## 2020-01-23 DIAGNOSIS — Z510 Encounter for antineoplastic radiation therapy: Secondary | ICD-10-CM | POA: Diagnosis not present

## 2020-01-23 DIAGNOSIS — C713 Malignant neoplasm of parietal lobe: Secondary | ICD-10-CM | POA: Diagnosis not present

## 2020-01-26 ENCOUNTER — Ambulatory Visit
Admission: RE | Admit: 2020-01-26 | Discharge: 2020-01-26 | Disposition: A | Discharge status: Home / Self Care | Payer: Medicare PPO | Source: Ambulatory Visit | Attending: Radiation Oncology | Admitting: Radiation Oncology

## 2020-01-26 ENCOUNTER — Other Ambulatory Visit: Payer: Self-pay

## 2020-01-26 DIAGNOSIS — Z510 Encounter for antineoplastic radiation therapy: Secondary | ICD-10-CM | POA: Diagnosis not present

## 2020-01-26 DIAGNOSIS — C713 Malignant neoplasm of parietal lobe: Secondary | ICD-10-CM | POA: Diagnosis not present

## 2020-01-27 ENCOUNTER — Other Ambulatory Visit: Payer: Self-pay

## 2020-01-27 ENCOUNTER — Ambulatory Visit
Admission: RE | Admit: 2020-01-27 | Discharge: 2020-01-27 | Disposition: A | Discharge status: Home / Self Care | Payer: Medicare PPO | Source: Ambulatory Visit | Attending: Radiation Oncology | Admitting: Radiation Oncology

## 2020-01-27 DIAGNOSIS — C713 Malignant neoplasm of parietal lobe: Secondary | ICD-10-CM | POA: Diagnosis not present

## 2020-01-27 DIAGNOSIS — Z510 Encounter for antineoplastic radiation therapy: Secondary | ICD-10-CM | POA: Diagnosis not present

## 2020-01-27 NOTE — Telephone Encounter (Signed)
Called and spoke with the pt , offered phone visit today 8-31, pt couldn't do today, she said she would call back to schedule

## 2020-01-28 ENCOUNTER — Other Ambulatory Visit: Payer: Self-pay

## 2020-01-28 ENCOUNTER — Ambulatory Visit
Admission: RE | Admit: 2020-01-28 | Discharge: 2020-01-28 | Disposition: A | Discharge status: Home / Self Care | Payer: Medicare PPO | Source: Ambulatory Visit | Attending: Radiation Oncology | Admitting: Radiation Oncology

## 2020-01-28 DIAGNOSIS — Z510 Encounter for antineoplastic radiation therapy: Secondary | ICD-10-CM | POA: Diagnosis not present

## 2020-01-28 DIAGNOSIS — C713 Malignant neoplasm of parietal lobe: Secondary | ICD-10-CM | POA: Insufficient documentation

## 2020-01-29 ENCOUNTER — Ambulatory Visit
Admission: RE | Admit: 2020-01-29 | Discharge: 2020-01-29 | Disposition: A | Discharge status: Home / Self Care | Payer: Medicare PPO | Source: Ambulatory Visit | Attending: Radiation Oncology | Admitting: Radiation Oncology

## 2020-01-29 ENCOUNTER — Other Ambulatory Visit: Payer: Self-pay

## 2020-01-29 ENCOUNTER — Other Ambulatory Visit: Payer: Medicare PPO

## 2020-01-29 DIAGNOSIS — C713 Malignant neoplasm of parietal lobe: Secondary | ICD-10-CM | POA: Diagnosis not present

## 2020-01-29 DIAGNOSIS — Z510 Encounter for antineoplastic radiation therapy: Secondary | ICD-10-CM | POA: Diagnosis not present

## 2020-01-30 ENCOUNTER — Other Ambulatory Visit: Payer: Self-pay

## 2020-01-30 ENCOUNTER — Other Ambulatory Visit: Payer: Medicare PPO

## 2020-01-30 ENCOUNTER — Ambulatory Visit
Admission: RE | Admit: 2020-01-30 | Discharge: 2020-01-30 | Disposition: A | Discharge status: Home / Self Care | Payer: Medicare PPO | Source: Ambulatory Visit | Attending: Radiation Oncology | Admitting: Radiation Oncology

## 2020-01-30 DIAGNOSIS — Z510 Encounter for antineoplastic radiation therapy: Secondary | ICD-10-CM | POA: Diagnosis not present

## 2020-01-30 DIAGNOSIS — C713 Malignant neoplasm of parietal lobe: Secondary | ICD-10-CM | POA: Diagnosis not present

## 2020-02-03 ENCOUNTER — Other Ambulatory Visit: Payer: Self-pay

## 2020-02-03 ENCOUNTER — Ambulatory Visit
Admission: RE | Admit: 2020-02-03 | Discharge: 2020-02-03 | Disposition: A | Discharge status: Home / Self Care | Payer: Medicare PPO | Source: Ambulatory Visit | Attending: Radiation Oncology | Admitting: Radiation Oncology

## 2020-02-03 DIAGNOSIS — C713 Malignant neoplasm of parietal lobe: Secondary | ICD-10-CM | POA: Diagnosis not present

## 2020-02-03 DIAGNOSIS — Z510 Encounter for antineoplastic radiation therapy: Secondary | ICD-10-CM | POA: Diagnosis not present

## 2020-02-04 ENCOUNTER — Other Ambulatory Visit: Payer: Self-pay

## 2020-02-04 ENCOUNTER — Telehealth: Payer: Self-pay

## 2020-02-04 ENCOUNTER — Ambulatory Visit
Admission: RE | Admit: 2020-02-04 | Discharge: 2020-02-04 | Disposition: A | Discharge status: Home / Self Care | Payer: Medicare PPO | Source: Ambulatory Visit | Attending: Radiation Oncology | Admitting: Radiation Oncology

## 2020-02-04 DIAGNOSIS — C713 Malignant neoplasm of parietal lobe: Secondary | ICD-10-CM | POA: Diagnosis not present

## 2020-02-04 DIAGNOSIS — Z510 Encounter for antineoplastic radiation therapy: Secondary | ICD-10-CM | POA: Diagnosis not present

## 2020-02-04 NOTE — Telephone Encounter (Signed)
-----   Message from Ventura Sellers, MD sent at 02/04/2020  1:14 PM EDT ----- Regarding: RE: Juluis Rainier We can stop Temodar after 9/11 dose, don't need to refill  Zach ----- Message ----- From: Tami Lin, RN Sent: 02/04/2020  10:11 AM EDT To: Wilmon Arms, RN, Ventura Sellers, MD Subject: Juluis Rainier                                            Patient's last radiation treatment is 9/13 and patient's daughter said patient will run out of Temodar on 9/11. Patient's daughter cannot make it to the visit tomorrow but wanted to let you know about this in case you plan to continue Temodar-patient will need a refill.  Thanks,  Lanelle Bal

## 2020-02-04 NOTE — Telephone Encounter (Signed)
Called patient's daughter and let her know last dose of Temodar is 9/11 and no refill is needed per Dr. Mickeal Skinner. She verbalized understanding.

## 2020-02-05 ENCOUNTER — Ambulatory Visit
Admission: RE | Admit: 2020-02-05 | Discharge: 2020-02-05 | Disposition: A | Discharge status: Home / Self Care | Payer: Medicare PPO | Source: Ambulatory Visit | Attending: Radiation Oncology | Admitting: Radiation Oncology

## 2020-02-05 ENCOUNTER — Other Ambulatory Visit: Payer: Self-pay

## 2020-02-05 ENCOUNTER — Inpatient Hospital Stay: Payer: Medicare PPO | Attending: Internal Medicine | Admitting: Internal Medicine

## 2020-02-05 ENCOUNTER — Inpatient Hospital Stay: Payer: Medicare PPO

## 2020-02-05 VITALS — BP 140/80 | HR 87 | Temp 98.4°F | Resp 20 | Ht 68.0 in | Wt 150.6 lb

## 2020-02-05 DIAGNOSIS — C719 Malignant neoplasm of brain, unspecified: Secondary | ICD-10-CM

## 2020-02-05 DIAGNOSIS — C713 Malignant neoplasm of parietal lobe: Secondary | ICD-10-CM | POA: Diagnosis not present

## 2020-02-05 DIAGNOSIS — Z510 Encounter for antineoplastic radiation therapy: Secondary | ICD-10-CM | POA: Diagnosis not present

## 2020-02-05 LAB — CBC WITH DIFFERENTIAL (CANCER CENTER ONLY)
Abs Immature Granulocytes: 0.01 10*3/uL (ref 0.00–0.07)
Basophils Absolute: 0 10*3/uL (ref 0.0–0.1)
Basophils Relative: 1 %
Eosinophils Absolute: 0.2 10*3/uL (ref 0.0–0.5)
Eosinophils Relative: 3 %
HCT: 43.4 % (ref 36.0–46.0)
Hemoglobin: 14.7 g/dL (ref 12.0–15.0)
Immature Granulocytes: 0 %
Lymphocytes Relative: 10 %
Lymphs Abs: 0.6 10*3/uL — ABNORMAL LOW (ref 0.7–4.0)
MCH: 29.2 pg (ref 26.0–34.0)
MCHC: 33.9 g/dL (ref 30.0–36.0)
MCV: 86.1 fL (ref 80.0–100.0)
Monocytes Absolute: 0.5 10*3/uL (ref 0.1–1.0)
Monocytes Relative: 8 %
Neutro Abs: 4.8 10*3/uL (ref 1.7–7.7)
Neutrophils Relative %: 78 %
Platelet Count: 170 10*3/uL (ref 150–400)
RBC: 5.04 MIL/uL (ref 3.87–5.11)
RDW: 13.8 % (ref 11.5–15.5)
WBC Count: 6.2 10*3/uL (ref 4.0–10.5)
nRBC: 0 % (ref 0.0–0.2)

## 2020-02-05 LAB — CMP (CANCER CENTER ONLY)
ALT: 16 U/L (ref 0–44)
AST: 19 U/L (ref 15–41)
Albumin: 4.3 g/dL (ref 3.5–5.0)
Alkaline Phosphatase: 67 U/L (ref 38–126)
Anion gap: 7 (ref 5–15)
BUN: 18 mg/dL (ref 8–23)
CO2: 30 mmol/L (ref 22–32)
Calcium: 10.2 mg/dL (ref 8.9–10.3)
Chloride: 109 mmol/L (ref 98–111)
Creatinine: 0.82 mg/dL (ref 0.44–1.00)
GFR, Est AFR Am: >60 mL/min (ref >60)
GFR, Estimated: >60 mL/min (ref >60)
Glucose, Bld: 85 mg/dL (ref 70–99)
Potassium: 4.3 mmol/L (ref 3.5–5.1)
Sodium: 146 mmol/L — ABNORMAL HIGH (ref 135–145)
Total Bilirubin: 0.7 mg/dL (ref 0.3–1.2)
Total Protein: 7.3 g/dL (ref 6.5–8.1)

## 2020-02-05 NOTE — Progress Notes (Signed)
[facility] at [place], [place] [address] [phone]   Interval Evaluation  Date of Service: 02/05/20 Patient Name: Wanda Ross Patient MRN: [id number] Patient DOB: 1950/07/02 Provider: Ventura Sellers, MD  Identifying Statement:  Wanda Ross is a 69 y.o. female with left frontal glioblastoma   Oncologic History: 11/26/19: Craniotomy, resection of L parietal mass with Dr. Marcello Moores; path is GBM  Biomarkers:  MGMT Unknown.  IDH 1/2 Wild type.  EGFR Unknown  TERT Unknown   Interval History:  Wanda Ross presents today for follow up, now in final week of IMRT and concurrent Temodar.  No further issues tolerating chemotherapy or radiation.  Continues to walk a mile daily for aerobic exercise.  No seizures or headaches.  Fatigue is stable from prior, continues daily napping.  H+P (12/15/19) Patient presented to medical attention in late June with several weeks of progressive symptoms, mainly characterized by gait impairment, confusion and disorientation.  CNS imaging was obtained which demonstrated 4-5cm mass in the left parietal region, consistent with likely brain brain tumor.  She underwent craniotomy and resection with Dr. Marcello Moores on 11/26/19.  Following surgery she developed some right sided weakness, but this recovered to essentially full strength following nearly 2 weeks course of inpatient rehab.  Currently, she is living at home with her sister, although prior to this she was living alone.  She maintains full functional status, no limitations at present time.  Retired middle school principal.  Medications: Current Outpatient Medications on File Prior to Visit  Medication Sig Dispense Refill  . acetaminophen (TYLENOL) 325 MG tablet Take 2 tablets (650 mg total) by mouth every 4 (four) hours as needed for mild pain (temp > 100.5).    Marland Kitchen alendronate (FOSAMAX) 70 MG tablet Take 1 tablet by mouth once a week.    Marland Kitchen  amLODipine (NORVASC) 5 MG tablet Take 1 tablet (5 mg total) by mouth daily. 90 tablet 1  . Calcium Carbonate-Vit D-Min (CALCIUM 1200) 1200-1000 MG-UNIT CHEW Chew 1,200 mg by mouth daily. 30 tablet 1  . cloNIDine (CATAPRES) 0.3 MG tablet TAKE 1 TABLET BY MOUTH EVERYDAY AT BEDTIME 90 tablet 0  . Cyanocobalamin 2500 MCG TABS Take 2,500 mcg by mouth daily. 30 tablet 0  . docusate sodium (COLACE) 100 MG capsule Take 1 capsule (100 mg total) by mouth 2 (two) times daily. 10 capsule 0  . dorzolamide-timolol (COSOPT) 22.3-6.8 MG/ML ophthalmic solution Place 1 drop into the right eye 2 (two) times daily.     . Multiple Vitamins-Minerals (CENTRUM SILVER ULTRA WOMENS) TABS Take 1 tablet by mouth daily.     . ondansetron (ZOFRAN) 8 MG tablet Take 1 tablet 2 (two) times daily as needed for nausea/vomiting. May take 30-60 minutes prior to Temodar administration if nausea/vomiting occurs    . pravastatin (PRAVACHOL) 10 MG tablet TAKE 1 TABLET BY MOUTH DAILY *DOSE REDUCTION* 90 tablet 1  . ramipril (ALTACE) 1.25 MG capsule Take 1.25 mg by mouth as directed. Increase by one capsule weekly until you are taking 4 capsules daily    . temozolomide (TEMODAR) 100 MG capsule Take 100 mg by mouth daily. May take on an empty stomach or at bedtime to decrease nausea & vomiting.    . temozolomide (TEMODAR) 20 MG capsule Take 20 mg by mouth daily. May take on an empty stomach or at bedtime to decrease nausea & vomiting.     No current facility-administered medications on file prior  to visit.    Allergies: No Known Allergies Past Medical History:  Past Medical History:  Diagnosis Date  . Glaucoma   . Hyperlipidemia 2010  . Hypertension 1990  . IGT (impaired glucose tolerance) 05/11/2010  . Multinodular goiter 2010   normal function   Past Surgical History:  Past Surgical History:  Procedure Laterality Date  . APPLICATION OF CRANIAL NAVIGATION Left 11/26/2019   Procedure: APPLICATION OF CRANIAL NAVIGATION;  Surgeon:  [name], MD;  Location: [facility];  Service: Neurosurgery;  Laterality: Left;  APPLICATION OF CRANIAL NAVIGATION  . COLONOSCOPY N/A 06/16/2016   Procedure: COLONOSCOPY;  Surgeon: Danie Binder, MD;  [facility];  Service: Endoscopy;  Laterality: N/A;  9:30 AM  . COLONOSCOPY N/A 10/22/2019   Procedure: COLONOSCOPY;  Surgeon: Daneil Dolin, MD;  [facility];  Service: Endoscopy;  Laterality: N/A;  9:30  . CRANIOTOMY Left 11/26/2019   Procedure: LEFT PARIETAL CRANIOTOMY FOR RESECTION OF TUMOR;  Surgeon: [name], MD;  Location: [facility];  Service: Neurosurgery;  Laterality: Left;  LEFT PARIETAL CRANIOTOMY FOR RESECTION OF TUMOR  . EYE SURGERY  2017   3 on left eye 1 on right eye for glaucoma by Dr. Venetia Maxon   . EYE SURGERY  2018   Cataract Surgery by Dr. Arlina Robes   . OPERATIVE ULTRASOUND N/A 11/26/2019   Procedure: OPERATIVE ULTRASOUND;  Surgeon: [name], MD;  Location: [facility];  Service: Neurosurgery;  Laterality: N/A;  OPERATIVE ULTRASOUND   Social History:  Social History   Socioeconomic History  . Marital status: Widowed    Spouse name: Not on file  . Number of children: 3  . Years of education: Not on file  . Highest education level: Not on file  Occupational History  . Occupation: employed   Tobacco Use  . Smoking status: Never Smoker  . Smokeless tobacco: Never Used  Vaping Use  . Vaping Use: Never used  Substance and Sexual Activity  . Alcohol use: No  . Drug use: No  . Sexual activity: Never    Birth control/protection: Post-menopausal  Other Topics Concern  . Not on file  Social History Narrative  . Not on file   Social Determinants of Health   Financial Resource Strain:   . Difficulty of Paying Living Expenses: Not on file  Food Insecurity:   . Worried About Charity fundraiser in the Last Year: Not on file  . Ran Out of Food in the Last Year: Not on file  Transportation Needs:   . Lack of Transportation (Medical): Not  on file  . Lack of Transportation (Non-Medical): Not on file  Physical Activity:   . Days of Exercise per Week: Not on file  . Minutes of Exercise per Session: Not on file  Stress:   . Feeling of Stress : Not on file  Social Connections:   . Frequency of Communication with Friends and Family: Not on file  . Frequency of Social Gatherings with Friends and Family: Not on file  . Attends Religious Services: Not on file  . Active Member of Clubs or Organizations: Not on file  . Attends Archivist Meetings: Not on file  . Marital Status: Not on file  Intimate Partner Violence:   . Fear of Current or Ex-Partner: Not on file  . Emotionally Abused: Not on file  . Physically Abused: Not on file  . Sexually Abused: Not on file   Family History:  Family History  Problem Relation Age of Onset  . Dementia Father   . Dementia Mother   . Diabetes Mother   . Hypertension Mother   . Breast cancer Sister   . Hypertension Sister   . Diabetes Brother     Review of Systems: Constitutional: Doesn't report fevers, chills or abnormal weight loss Eyes: Doesn't report blurriness of vision Ears, nose, mouth, throat, and face: Doesn't report sore throat Respiratory: Doesn't report cough, dyspnea or wheezes Cardiovascular: Doesn't report palpitation, chest discomfort  Gastrointestinal:  Doesn't report nausea, constipation, diarrhea GU: Doesn't report incontinence Skin: Doesn't report skin rashes Neurological: Per HPI Musculoskeletal: Doesn't report joint pain Behavioral/Psych: Doesn't report anxiety  Physical Exam: Vitals:   02/05/20 1135  BP: 140/80  Pulse: 87  Resp: 20  Temp: 98.4 F (36.9 C)  SpO2: 100%   KPS: 80. General: Alert, cooperative, pleasant, in no acute distress Head: Normal EENT: No conjunctival injection or scleral icterus.  Lungs: Resp effort normal Cardiac: Regular rate Abdomen: Non-distended abdomen Skin: No rashes cyanosis or petechiae. Extremities: No  clubbing or edema  Neurologic Exam: Mental Status: Awake, alert, attentive to examiner. Oriented to self and environment. Language is fluent with intact comprehension.  Cranial Nerves: Visual acuity is grossly normal. Visual fields are full. Extra-ocular movements intact. No ptosis. Face is symmetric Motor: Tone and bulk are normal. Power is full in both arms and legs. Reflexes are symmetric, no pathologic reflexes present.  Sensory: Intact to light touch Gait: Modestly hemiparetic.   Labs: I have reviewed the data as listed    Component Value Date/Time   NA 142 01/22/2020 1100   K 4.8 01/22/2020 1100   CL 103 01/22/2020 1100   CO2 33 (H) 01/22/2020 1100   GLUCOSE 95 01/22/2020 1100   BUN 16 01/22/2020 1100   CREATININE 0.84 01/22/2020 1100   CREATININE 0.86 05/21/2019 0754   CALCIUM 11.1 (H) 01/22/2020 1100   PROT 7.3 01/22/2020 1100   ALBUMIN 4.4 01/22/2020 1100   AST 17 01/22/2020 1100   ALT 11 01/22/2020 1100   ALKPHOS 71 01/22/2020 1100   BILITOT 0.7 01/22/2020 1100   GFRNONAA >60 01/22/2020 1100   GFRNONAA 68 05/21/2019 0754   GFRAA >60 01/22/2020 1100   GFRAA 79 05/21/2019 0754   Lab Results  Component Value Date   WBC 6.2 02/05/2020   NEUTROABS 4.8 02/05/2020   HGB 14.7 02/05/2020   HCT 43.4 02/05/2020   MCV 86.1 02/05/2020   PLT 170 02/05/2020     Assessment/Plan Glioblastoma (HCC) [C71.9]  Wanda Ross is clinically stable today.  She is now in final week of IMRT and Temodar.  Remains active and independent.   We recommended continuing with full 6 weeks course of intensity modulated radiation therapy and concurrent daily Temozolomide.  Radiation will be administered Mon-Fri over 6 weeks, Temodar will be dosed at 45m/m2 to be given daily over 42 days.  We reviewed side effects of temodar, including fatigue, nausea/vomiting, constipation, and cytopenias.  Chemotherapy should be held for the following:  ANC less than 1,000  Platelets less than  100,000  LFT or creatinine greater than 2x ULN  If clinical concerns/contraindications develop  No changes to medications today.  Labs were within normal limits.  Wanda Ross return to clinic in 1 month with post-RT MRI for evaluation, or sooner if needed.  All questions were answered. The patient knows to call the clinic with any problems, questions or concerns. No barriers to learning  were detected.  I have spent a total of 30 minutes of face-to-face and non-face-to-face time, excluding clinical staff time, preparing to see patient, ordering tests and/or medications, counseling the patient, and independently interpreting results and communicating results to the patient/family/caregiver    Ventura Sellers, MD Medical Director of Neuro-Oncology [facility] at [place] 02/05/20 11:45 AM

## 2020-02-05 NOTE — Research (Signed)
[id number] - TESTING RAMIPRIL TO PREVENT MEMORY LOSS IN PEOPLE WITH GLIOBLASTOMA  02/05/2020 11:00AM  TOXICITY ASSESSMENT: Wanda Ross arrives today for her chemoradiation treatment and I have an opportunity to speak with her before her lab appointment. She denies any complaints or side effects today; there are currently no medication changes. She is scheduled to see Dr. Mickeal Skinner this morning at 11:30am. Wanda Ross is thanked for her time and participation in the Ramipril study and is encouraged to contact me if needed: she verbalizes understanding.   Dionne Bucy. Sharlett Iles, BSN, RN, CIC 02/05/2020 11:24 AM

## 2020-02-06 ENCOUNTER — Other Ambulatory Visit: Payer: Self-pay

## 2020-02-06 ENCOUNTER — Ambulatory Visit
Admission: RE | Admit: 2020-02-06 | Discharge: 2020-02-06 | Disposition: A | Discharge status: Home / Self Care | Payer: Medicare PPO | Source: Ambulatory Visit | Attending: Radiation Oncology | Admitting: Radiation Oncology

## 2020-02-06 ENCOUNTER — Telehealth: Payer: Self-pay | Admitting: Internal Medicine

## 2020-02-06 DIAGNOSIS — C713 Malignant neoplasm of parietal lobe: Secondary | ICD-10-CM | POA: Diagnosis not present

## 2020-02-06 DIAGNOSIS — Z510 Encounter for antineoplastic radiation therapy: Secondary | ICD-10-CM | POA: Diagnosis not present

## 2020-02-06 NOTE — Telephone Encounter (Signed)
Scheduled per 9/9 los. Unable to reach pt. Left voicemail with appt time and date.

## 2020-02-09 ENCOUNTER — Encounter: Payer: Self-pay | Admitting: Radiation Oncology

## 2020-02-09 ENCOUNTER — Other Ambulatory Visit: Payer: Self-pay

## 2020-02-09 ENCOUNTER — Ambulatory Visit
Admission: RE | Admit: 2020-02-09 | Discharge: 2020-02-09 | Disposition: A | Discharge status: Home / Self Care | Payer: Medicare PPO | Source: Ambulatory Visit | Attending: Radiation Oncology | Admitting: Radiation Oncology

## 2020-02-09 DIAGNOSIS — C713 Malignant neoplasm of parietal lobe: Secondary | ICD-10-CM | POA: Diagnosis not present

## 2020-02-09 DIAGNOSIS — Z510 Encounter for antineoplastic radiation therapy: Secondary | ICD-10-CM | POA: Diagnosis not present

## 2020-02-10 ENCOUNTER — Encounter: Payer: Self-pay | Admitting: Physical Medicine & Rehabilitation

## 2020-02-10 ENCOUNTER — Encounter: Payer: Medicare PPO | Attending: Physical Medicine & Rehabilitation | Admitting: Physical Medicine & Rehabilitation

## 2020-02-10 VITALS — BP 140/80 | HR 88 | Temp 98.0°F | Ht 68.0 in | Wt 150.0 lb

## 2020-02-10 DIAGNOSIS — C719 Malignant neoplasm of brain, unspecified: Secondary | ICD-10-CM | POA: Insufficient documentation

## 2020-02-10 DIAGNOSIS — I10 Essential (primary) hypertension: Secondary | ICD-10-CM | POA: Insufficient documentation

## 2020-02-10 DIAGNOSIS — R7401 Elevation of levels of liver transaminase levels: Secondary | ICD-10-CM | POA: Insufficient documentation

## 2020-02-10 NOTE — Progress Notes (Signed)
Subjective:    Patient ID: Wanda Ross, female    DOB: 08-Jun-1950, 69 y.o.   MRN: [id number]  TELEHEALTH NOTE  Due to national recommendations of social distancing due to COVID 19, an audio/video telehealth visit is felt to be most appropriate for this patient at this time.  See Chart message from today for the patient's consent to telehealth from [place].     I verified that I am speaking with the correct person using two identifiers.  Location of patient: Home Location of provider: Office Method of communication: My Chart Video Names of participants : Zorita Pang scheduling, [place] Scrape obtaining consent and vitals if available Established patient  HPI Right-handed female with history of hyperlipidemia and hypertension presents for hospital follow up for glioblastoma.     Last clinic visit on 12/22/2019.  Since that time, patient states she completed therapies.  She continues to follow up with Neuro/Onc.  Denies falls.   Pain Inventory Average Pain 0 Pain Right Now 0 My pain is no pain  In the last 24 hours, has pain interfered with the following? General activity no pain Relation with others 0 Enjoyment of life 0 What TIME of day is your pain at its worst? no pain Sleep (in general) Good  Pain is worse with: no pain Pain improves with: no pain Relief from Meds: no pain  Mobility walk without assistance how many minutes can you walk? 30      Family History  Problem Relation Age of Onset  . Dementia Father   . Dementia Mother   . Diabetes Mother   . Hypertension Mother   . Breast cancer Sister   . Hypertension Sister   . Diabetes Brother    Social History   Socioeconomic History  . Marital status: Widowed    Spouse name: Not on file  . Number of children: 3  . Years of education: Not on file  . Highest education level: Not on file  Occupational History  . Occupation: employed   Tobacco Use  . Smoking  status: Never Smoker  . Smokeless tobacco: Never Used  Vaping Use  . Vaping Use: Never used  Substance and Sexual Activity  . Alcohol use: No  . Drug use: No  . Sexual activity: Never    Birth control/protection: Post-menopausal  Other Topics Concern  . Not on file  Social History Narrative  . Not on file   Social Determinants of Health   Financial Resource Strain:   . Difficulty of Paying Living Expenses: Not on file  Food Insecurity:   . Worried About Charity fundraiser in the Last Year: Not on file  . Ran Out of Food in the Last Year: Not on file  Transportation Needs:   . Lack of Transportation (Medical): Not on file  . Lack of Transportation (Non-Medical): Not on file  Physical Activity:   . Days of Exercise per Week: Not on file  . Minutes of Exercise per Session: Not on file  Stress:   . Feeling of Stress : Not on file  Social Connections:   . Frequency of Communication with Friends and Family: Not on file  . Frequency of Social Gatherings with Friends and Family: Not on file  . Attends Religious Services: Not on file  . Active Member of Clubs or Organizations: Not on file  . Attends Archivist Meetings: Not on file  . Marital Status: Not on file  Past Surgical History:  Procedure Laterality Date  . APPLICATION OF CRANIAL NAVIGATION Left 11/26/2019   Procedure: APPLICATION OF CRANIAL NAVIGATION;  Surgeon: [name], MD;  Location: [place];  Service: Neurosurgery;  Laterality: Left;  APPLICATION OF CRANIAL NAVIGATION  . COLONOSCOPY N/A 06/16/2016   Procedure: COLONOSCOPY;  Surgeon: Danie Binder, MD;  [facility];  Service: Endoscopy;  Laterality: N/A;  9:30 AM  . COLONOSCOPY N/A 10/22/2019   Procedure: COLONOSCOPY;  Surgeon: Daneil Dolin, MD;  [facility];  Service: Endoscopy;  Laterality: N/A;  9:30  . CRANIOTOMY Left 11/26/2019   Procedure: LEFT PARIETAL CRANIOTOMY FOR RESECTION OF TUMOR;  Surgeon: [name],  MD;  Location: [place];  Service: Neurosurgery;  Laterality: Left;  LEFT PARIETAL CRANIOTOMY FOR RESECTION OF TUMOR  . EYE SURGERY  2017   3 on left eye 1 on right eye for glaucoma by Dr. Venetia Maxon   . EYE SURGERY  2018   Cataract Surgery by Dr. Arlina Robes   . OPERATIVE ULTRASOUND N/A 11/26/2019   Procedure: OPERATIVE ULTRASOUND;  Surgeon: [name], MD;  Location: [place];  Service: Neurosurgery;  Laterality: N/A;  OPERATIVE ULTRASOUND   Past Medical History:  Diagnosis Date  . Glaucoma   . Hyperlipidemia 2010  . Hypertension 1990  . IGT (impaired glucose tolerance) 05/11/2010  . Multinodular goiter 2010   normal function   There were no vitals taken for this visit.  Opioid Risk Score:   Fall Risk Score:  `1  Depression screen PHQ 2/9  Depression screen [facility] 2/9 12/23/2019 12/22/2019 06/04/2019 12/19/2018 07/11/2018 02/06/2018 12/31/2017  Decreased Interest 0 0 0 0 0 0 0  Down, Depressed, Hopeless 0 0 0 0 0 0 0  PHQ - 2 Score 0 0 0 0 0 0 0  Altered sleeping - 0 - - - - -  Tired, decreased energy - 0 - - - - -  Change in appetite - 0 - - - - -  Feeling bad or failure about yourself  - 0 - - - - -  Trouble concentrating - 0 - - - - -  Moving slowly or fidgety/restless - 0 - - - - -  Suicidal thoughts - 0 - - - - -  PHQ-9 Score - 0 - - - - -  Difficult doing work/chores - - - - - - -  Some recent data might be hidden   Review of Systems  Constitutional: Negative.   HENT: Negative.   Eyes: Negative.   Respiratory: Negative.   Cardiovascular: Negative.   Gastrointestinal: Negative.   Endocrine: Negative.   Genitourinary: Negative.   Musculoskeletal: Negative.   Skin: Negative.   Allergic/Immunologic: Negative.   Neurological: Negative.   Hematological: Negative.   Psychiatric/Behavioral: Negative.   All other systems reviewed and are negative.      Objective:   Physical Exam Constitutional: NAD. HENT: Normocephalic. Atraumatic. Eyes: EOMI. No discharge. Psych: Normal  mood.  Normal behavior. Neuro: Alert and oriented x3    Assessment & Plan:  Right-handed female with history of hyperlipidemia and hypertension presents for hospital follow up for glioblastoma.     1.  Altered mental status secondary to glioma - improved.  Status post left craniotomy resection of tumor 11/26/2019.    Continue HEP, completed therapies  Continue follow up with Neuro/Onc  Continue follow up with Radiation/Onc  2. Glioblastoma:     Pathology positive for Grade 4 glioblastoma  See #1

## 2020-02-11 DIAGNOSIS — C719 Malignant neoplasm of brain, unspecified: Secondary | ICD-10-CM

## 2020-02-11 NOTE — Research (Signed)
[id number] - TESTING RAMIPRIL TO PREVENT MEMORY LOSS IN PEOPLE WITH GLIOBLASTOMA  02/11/2020 15:00PM  TOXICITY ASSESSMENT: Wanda Ross has been followed for 30 days post her last dose of Ramipril (01/11/2020) and no complaints or side effects are noted.  Wanda Ross is off-agent and off-study. No further assessments are required.  Dionne Bucy. Sharlett Iles, BSN, RN, CIC 02/11/2020 3:51 PM

## 2020-02-16 ENCOUNTER — Other Ambulatory Visit: Payer: Self-pay | Admitting: Radiation Therapy

## 2020-02-24 NOTE — Progress Notes (Signed)
  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  The patient then received a 14 Gy boost to yield a final dose of 60 Gy.  Narrative: The patient tolerated radiation treatment relatively well.     Plan: The patient has completed radiation treatment. The patient will return to radiation oncology clinic for routine followup in one month. I advised the patient to call or return sooner if they have any questions or concerns related to their recovery or treatment. ________________________________  Jodelle Gross, M.D., Ph.D.

## 2020-03-03 ENCOUNTER — Ambulatory Visit (HOSPITAL_COMMUNITY)
Admission: RE | Admit: 2020-03-03 | Discharge: 2020-03-03 | Disposition: A | Discharge status: Home / Self Care | Payer: Medicare PPO | Source: Ambulatory Visit | Attending: Internal Medicine | Admitting: Internal Medicine

## 2020-03-03 ENCOUNTER — Other Ambulatory Visit: Payer: Self-pay

## 2020-03-03 DIAGNOSIS — D496 Neoplasm of unspecified behavior of brain: Secondary | ICD-10-CM | POA: Diagnosis not present

## 2020-03-03 DIAGNOSIS — H3320 Serous retinal detachment, unspecified eye: Secondary | ICD-10-CM | POA: Diagnosis not present

## 2020-03-03 DIAGNOSIS — C719 Malignant neoplasm of brain, unspecified: Secondary | ICD-10-CM | POA: Insufficient documentation

## 2020-03-03 DIAGNOSIS — G936 Cerebral edema: Secondary | ICD-10-CM | POA: Diagnosis not present

## 2020-03-03 MED ORDER — GADOBUTROL 1 MMOL/ML IV SOLN
6.8000 mL | Freq: Once | INTRAVENOUS | Status: AC | PRN
  Administered 2020-03-03: 6.8 mL via INTRAVENOUS

## 2020-03-05 ENCOUNTER — Ambulatory Visit (HOSPITAL_COMMUNITY): Payer: Medicare PPO

## 2020-03-05 ENCOUNTER — Ambulatory Visit: Payer: Medicare PPO | Admitting: Internal Medicine

## 2020-03-08 ENCOUNTER — Telehealth: Payer: Self-pay | Admitting: Radiation Oncology

## 2020-03-08 ENCOUNTER — Other Ambulatory Visit: Payer: Self-pay

## 2020-03-08 ENCOUNTER — Inpatient Hospital Stay: Payer: Medicare PPO | Attending: Internal Medicine | Admitting: Internal Medicine

## 2020-03-08 ENCOUNTER — Encounter: Payer: Medicare PPO | Admitting: Family Medicine

## 2020-03-08 VITALS — BP 150/90 | HR 100 | Temp 98.0°F | Resp 17 | Ht 68.0 in | Wt 151.9 lb

## 2020-03-08 DIAGNOSIS — C719 Malignant neoplasm of brain, unspecified: Secondary | ICD-10-CM

## 2020-03-08 DIAGNOSIS — R531 Weakness: Secondary | ICD-10-CM | POA: Diagnosis not present

## 2020-03-08 DIAGNOSIS — C713 Malignant neoplasm of parietal lobe: Secondary | ICD-10-CM | POA: Diagnosis not present

## 2020-03-08 MED ORDER — TEMOZOLOMIDE 140 MG PO CAPS: 150.0000 mg/m2/d | ORAL_CAPSULE | Freq: Every day | ORAL | 0 refills | Status: DC

## 2020-03-08 MED ORDER — ONDANSETRON HCL 8 MG PO TABS: 8.0000 mg | ORAL_TABLET | Freq: Two times a day (BID) | ORAL | 1 refills | Status: DC | PRN

## 2020-03-08 NOTE — Progress Notes (Signed)
[facility] [place], [place] [address] [phone]   Interval Evaluation  Date of Service: 03/08/20 Patient Name: Wanda Ross Patient MRN: [id number] Patient DOB: Jan 30, 1951 Provider: Ventura Sellers, MD  Identifying Statement:  Wanda Ross is a 69 y.o. female with left frontal glioblastoma   Oncologic History: 11/26/19: Craniotomy, resection of L parietal mass with Dr. Marcello Moores; path is GBM 02/09/20: Completes IMRT and concurrent Temozolomide 34m/m2  Biomarkers:  MGMT Unknown.  IDH 1/2 Wild type.  EGFR Unknown  TERT Unknown   Interval History:  Wanda HELZERpresents today for follow up, now having completed IMRT and concurrent Temodar.  No major changes since treatment was completed.  Continues to walk a mile daily for aerobic exercise.  No seizures or headaches.  Fatigue is stable from prior.  She is anxious regarding MRI results.  H+P (12/15/19) Patient presented to medical attention in late June with several weeks of progressive symptoms, mainly characterized by gait impairment, confusion and disorientation.  CNS imaging was obtained which demonstrated 4-5cm mass in the left parietal region, consistent with likely brain brain tumor.  She underwent craniotomy and resection with Dr. TMarcello Mooreson 11/26/19.  Following surgery she developed some right sided weakness, but this recovered to essentially full strength following nearly 2 weeks course of inpatient rehab.  Currently, she is living at home with her sister, although prior to this she was living alone.  She maintains full functional status, no limitations at present time.  Retired middle school principal.  Medications: Current Outpatient Medications on File Prior to Visit  Medication Sig Dispense Refill  . acetaminophen (TYLENOL) 325 MG tablet Take 2 tablets (650 mg total) by mouth every 4 (four) hours as needed for mild pain (temp > 100.5).    .Marland Kitchen alendronate (FOSAMAX) 70 MG tablet Take 1 tablet by mouth once a week.    .Marland KitchenamLODipine (NORVASC) 5 MG tablet Take 1 tablet (5 mg total) by mouth daily. 90 tablet 1  . Calcium Carbonate-Vit D-Min (CALCIUM 1200) 1200-1000 MG-UNIT CHEW Chew 1,200 mg by mouth daily. 30 tablet 1  . cloNIDine (CATAPRES) 0.3 MG tablet TAKE 1 TABLET BY MOUTH EVERYDAY AT BEDTIME 90 tablet 0  . Cyanocobalamin 2500 MCG TABS Take 2,500 mcg by mouth daily. 30 tablet 0  . docusate sodium (COLACE) 100 MG capsule Take 1 capsule (100 mg total) by mouth 2 (two) times daily. 10 capsule 0  . dorzolamide-timolol (COSOPT) 22.3-6.8 MG/ML ophthalmic solution Place 1 drop into the right eye 2 (two) times daily.     . Multiple Vitamins-Minerals (CENTRUM SILVER ULTRA WOMENS) TABS Take 1 tablet by mouth daily.     . ondansetron (ZOFRAN) 8 MG tablet Take 1 tablet 2 (two) times daily as needed for nausea/vomiting. May take 30-60 minutes prior to Temodar administration if nausea/vomiting occurs    . pravastatin (PRAVACHOL) 10 MG tablet TAKE 1 TABLET BY MOUTH DAILY *DOSE REDUCTION* 90 tablet 1  . temozolomide (TEMODAR) 100 MG capsule Take 100 mg by mouth daily. May take on an empty stomach or at bedtime to decrease nausea & vomiting. (Patient not taking: Reported on 02/10/2020)    . temozolomide (TEMODAR) 20 MG capsule Take 20 mg by mouth daily. May take on an empty stomach or at bedtime to decrease nausea & vomiting. (Patient not taking: Reported on 02/10/2020)     No current facility-administered medications on file prior to visit.    Allergies: No  Known Allergies Past Medical History:  Past Medical History:  Diagnosis Date  . Glaucoma   . Hyperlipidemia 2010  . Hypertension 1990  . IGT (impaired glucose tolerance) 05/11/2010  . Multinodular goiter 2010   normal function   Past Surgical History:  Past Surgical History:  Procedure Laterality Date  . APPLICATION OF CRANIAL NAVIGATION Left 11/26/2019   Procedure: APPLICATION OF CRANIAL  NAVIGATION;  Surgeon: [name], MD;  Location: [place];  Service: Neurosurgery;  Laterality: Left;  APPLICATION OF CRANIAL NAVIGATION  . COLONOSCOPY N/A 06/16/2016   Procedure: COLONOSCOPY;  Surgeon: Danie Binder, MD;  [facility];  Service: Endoscopy;  Laterality: N/A;  9:30 AM  . COLONOSCOPY N/A 10/22/2019   Procedure: COLONOSCOPY;  Surgeon: Daneil Dolin, MD;  [facility];  Service: Endoscopy;  Laterality: N/A;  9:30  . CRANIOTOMY Left 11/26/2019   Procedure: LEFT PARIETAL CRANIOTOMY FOR RESECTION OF TUMOR;  Surgeon: [name], MD;  Location: [facility];  Service: Neurosurgery;  Laterality: Left;  LEFT PARIETAL CRANIOTOMY FOR RESECTION OF TUMOR  . EYE SURGERY  2017   3 on left eye 1 on right eye for glaucoma by Dr. Venetia Maxon   . EYE SURGERY  2018   Cataract Surgery by Dr. Arlina Robes   . OPERATIVE ULTRASOUND N/A 11/26/2019   Procedure: OPERATIVE ULTRASOUND;  Surgeon: [name], MD;  Location: [place];  Service: Neurosurgery;  Laterality: N/A;  OPERATIVE ULTRASOUND   Social History:  Social History   Socioeconomic History  . Marital status: Widowed    Spouse name: Not on file  . Number of children: 3  . Years of education: Not on file  . Highest education level: Not on file  Occupational History  . Occupation: employed   Tobacco Use  . Smoking status: Never Smoker  . Smokeless tobacco: Never Used  Vaping Use  . Vaping Use: Never used  Substance and Sexual Activity  . Alcohol use: No  . Drug use: No  . Sexual activity: Never    Birth control/protection: Post-menopausal  Other Topics Concern  . Not on file  Social History Narrative  . Not on file   Social Determinants of Health   Financial Resource Strain:   . Difficulty of Paying Living Expenses: Not on file  Food Insecurity:   . Worried About Charity fundraiser in the Last Year: Not on file  . Ran Out of Food in the Last Year: Not on file  Transportation Needs:   . Lack of  Transportation (Medical): Not on file  . Lack of Transportation (Non-Medical): Not on file  Physical Activity:   . Days of Exercise per Week: Not on file  . Minutes of Exercise per Session: Not on file  Stress:   . Feeling of Stress : Not on file  Social Connections:   . Frequency of Communication with Friends and Family: Not on file  . Frequency of Social Gatherings with Friends and Family: Not on file  . Attends Religious Services: Not on file  . Active Member of Clubs or Organizations: Not on file  . Attends Archivist Meetings: Not on file  . Marital Status: Not on file  Intimate Partner Violence:   . Fear of Current or Ex-Partner: Not on file  . Emotionally Abused: Not on file  . Physically Abused: Not on file  . Sexually Abused: Not on file   Family History:  Family History  Problem Relation Age of  Onset  . Dementia Father   . Dementia Mother   . Diabetes Mother   . Hypertension Mother   . Breast cancer Sister   . Hypertension Sister   . Diabetes Brother     Review of Systems: Constitutional: Doesn't report fevers, chills or abnormal weight loss Eyes: Doesn't report blurriness of vision Ears, nose, mouth, throat, and face: Doesn't report sore throat Respiratory: Doesn't report cough, dyspnea or wheezes Cardiovascular: Doesn't report palpitation, chest discomfort  Gastrointestinal:  Doesn't report nausea, constipation, diarrhea GU: Doesn't report incontinence Skin: Doesn't report skin rashes Neurological: Per HPI Musculoskeletal: Doesn't report joint pain Behavioral/Psych: Doesn't report anxiety  Physical Exam: Vitals:   03/08/20 1039  BP: (!) 150/90  Pulse: 100  Resp: 17  Temp: 98 F (36.7 C)  SpO2: 99%   KPS: 80. General: Alert, cooperative, pleasant, in no acute distress Head: Normal EENT: No conjunctival injection or scleral icterus.  Lungs: Resp effort normal Cardiac: Regular rate Abdomen: Non-distended abdomen Skin: No rashes cyanosis  or petechiae. Extremities: No clubbing or edema  Neurologic Exam: Mental Status: Awake, alert, attentive to examiner. Oriented to self and environment. Language is fluent with intact comprehension.  Cranial Nerves: Visual acuity is grossly normal. Visual fields are full. Extra-ocular movements intact. No ptosis. Face is symmetric Motor: Tone and bulk are normal. Power is full in both arms and legs. Reflexes are symmetric, no pathologic reflexes present.  Sensory: Intact to light touch Gait: Modestly hemiparetic.   Labs: I have reviewed the data as listed    Component Value Date/Time   NA 146 (H) 02/05/2020 1056   K 4.3 02/05/2020 1056   CL 109 02/05/2020 1056   CO2 30 02/05/2020 1056   GLUCOSE 85 02/05/2020 1056   BUN 18 02/05/2020 1056   CREATININE 0.82 02/05/2020 1056   CREATININE 0.86 05/21/2019 0754   CALCIUM 10.2 02/05/2020 1056   PROT 7.3 02/05/2020 1056   ALBUMIN 4.3 02/05/2020 1056   AST 19 02/05/2020 1056   ALT 16 02/05/2020 1056   ALKPHOS 67 02/05/2020 1056   BILITOT 0.7 02/05/2020 1056   GFRNONAA >60 02/05/2020 1056   GFRNONAA 68 05/21/2019 0754   GFRAA >60 02/05/2020 1056   GFRAA 79 05/21/2019 0754   Lab Results  Component Value Date   WBC 6.2 02/05/2020   NEUTROABS 4.8 02/05/2020   HGB 14.7 02/05/2020   HCT 43.4 02/05/2020   MCV 86.1 02/05/2020   PLT 170 02/05/2020    Imaging:  [place] Clinician Interpretation: I have personally reviewed the CNS images as listed.  My interpretation, in the context of the patient's clinical presentation, is treatment effect vs true progression  MR BRAIN W WO CONTRAST  Result Date: 03/03/2020 CLINICAL DATA:  Follow-up resection of left parietal high-grade glioma. EXAM: MRI HEAD WITHOUT AND WITH CONTRAST TECHNIQUE: Multiplanar, multiecho pulse sequences of the brain and surrounding structures were obtained without and with intravenous contrast. CONTRAST:  6.24m GADAVIST GADOBUTROL 1 MMOL/ML IV SOLN COMPARISON:  11/27/2019.   11/22/2019. FINDINGS: Brain: There is recurrence/progression of enhancing tumor along the margins of the resection, resembling the pattern of the tumor at initial presentation. Immediately following surgery, there was only minimal residual enhancement along the surgical margins. On today's study, there is a somewhat rounded 2.1 x 1.9 x 1.9 cm tumor nodule to the right of midline at the location of the previous resected corpus callosum. Enhancing tumor along the left lateral margin is difficult to measure precisely but extends 3.1 cm front to back  with a maximal transverse diameter 2.3 x 1.4 cm in the region of the atrium of the left lateral ventricle. Ependymal enhancement at the atrium is present, but there is not a pattern of diffuse ependymal enhancement. Regional edema and mass effect are increased compared to the last study. No distant tumor nodule is identified. No unexpected extra-axial fluid. No ischemic infarction. Vascular: Major vessels at the base of the brain show flow. Skull and upper cervical spine: Negative except for the left parietal craniotomy findings. Sinuses/Orbits: Sinuses are clear. Patient apparently has developed a retinal detachment on the right. Other: None IMPRESSION: 1. Recurrence/progression of enhancing tumor along the margins of the resection, resembling the pattern of the tumor at initial presentation. 2. Increase in regional edema and mass effect. 3. Ependymal enhancement at the atrium of the left lateral ventricle, without evidence of diffuse ependymal enhancement. 4. Apparent retinal detachment on the right. Electronically Signed   By: Nelson Chimes M.D.   On: 03/03/2020 20:22    Assessment/Plan Glioblastoma ([place]) [C71.9]  Wanda Ross is clinically stable today, now having completed IMRT and Temodar.  MRI demonstrates progression of disease within the high dose radiation treatment field, which could represent progression of organic neoplasm or pseudo-progression  secondary to radiation.  We recommended initiating treatment with Temozolomide 150 mg/m2, on for five days and off for twenty three days in twenty eight day cycles. The patient will have a complete blood count performed on days 21 and 28 of each cycle, and a comprehensive metabolic panel performed on day 28 of each cycle. Labs may need to be performed more often. Zofran will prescribed for home use for nausea/vomiting.  Chemotherapy should be held for the following:  ANC less than 1,000  Platelets less than 100,000  LFT or creatinine greater than 2x ULN  If clinical concerns/contraindications develop  Wanda Ross will return to clinic in 1 month with labs for evaluation prior to cycle #2.  All questions were answered. The patient knows to call the clinic with any problems, questions or concerns. No barriers to learning were detected.  I have spent a total of 40 minutes of face-to-face and non-face-to-face time, excluding clinical staff time, preparing to see patient, ordering tests and/or medications, counseling the patient, and independently interpreting results and communicating results to the patient/family/caregiver    Ventura Sellers, MD Medical Director of Neuro-Oncology [facility] 03/08/20 10:55 AM

## 2020-03-08 NOTE — Telephone Encounter (Signed)
  Radiation Oncology         [phone] ________________________________  Name: Wanda Ross MRN: [id number]  Date of Service: 03/08/2020  DOB: 1950/12/03  Post Treatment Telephone Note  Diagnosis:   Glioblastoma  Interval Since Last Radiation: 4 weeks   12/29/19 - 02/09/20: The patient was treated to the target region and areas of edema initially to a dose of 46 Gy using a IMRT technique.  The patient then received a 14 Gy boost to yield a final dose of 60 Gy.  Narrative:  The patient was contacted today for routine follow-up. During treatment she did very well with radiotherapy and did not have significant desquamation. She reports she is doing pretty well. She just saw Dr. Mickeal Skinner today and plans to begin Temodar cyclically.   Impression/Plan: 1. Left Parietal Glioblastoma. The patient has been doing well since completion of radiotherapy. We discussed that we would be happy to continue to follow her as needed, as well as be a part of the discussion during brian and spine oncology conference. She will  continue to follow up with Dr. Mickeal Skinner in neuro oncology and glad to know all the team members are still in conversation regarding her care.     Carola Rhine, PAC

## 2020-03-08 NOTE — Progress Notes (Signed)
DISCONTINUE ON PATHWAY REGIMEN - Neuro     One cycle, concurrent with RT:     Temozolomide   **Always confirm dose/schedule in your pharmacy ordering system**  REASON: Continuation Of Treatment PRIOR TREATMENT: [id number]: Radiation Therapy with Concurrent Temozolomide 75 mg/m2 Daily x 6 Weeks, Followed by Sequential Temozolomide TREATMENT RESPONSE: Progressive Disease (PD)  START ON PATHWAY REGIMEN - Neuro     A cycle is every 28 days:     Temozolomide      Temozolomide   **Always confirm dose/schedule in your pharmacy ordering system**  Patient Characteristics: Glioblastoma (Grade IV Glioma), Newly Diagnosed / Treatment Naive, Good Performance Status and/or Younger Patient, MGMT Promoter Unmethylated/Unknown Disease Classification: Glioma Disease Classification: Glioblastoma (Grade IV Glioma) Disease Status: Newly Diagnosed / Treatment Naive Performance Status: Good Performance Status and/or Younger Patient MGMT Promoter Methylation Status: Awaiting Test Results Intent of Therapy: Non-Curative / Palliative Intent, Discussed with Patient

## 2020-03-09 ENCOUNTER — Telehealth: Payer: Self-pay | Admitting: Internal Medicine

## 2020-03-09 NOTE — Telephone Encounter (Signed)
Scheduled per 10/11 los. Unable to reach pt. Left voicemail with appt times and date.

## 2020-04-06 ENCOUNTER — Other Ambulatory Visit: Payer: Self-pay | Admitting: Internal Medicine

## 2020-04-06 ENCOUNTER — Telehealth: Payer: Self-pay | Admitting: *Deleted

## 2020-04-06 MED ORDER — DEXAMETHASONE 4 MG PO TABS: 4.0000 mg | ORAL_TABLET | Freq: Two times a day (BID) | ORAL | 1 refills | Status: DC

## 2020-04-06 NOTE — Telephone Encounter (Signed)
Patients daughter [name] called to report concern for change in mother.    Reports no ability to exercise as she was able to do before due to increasing balance concerns, increased confusion, unable to perform normal tasks, they are providing 24 hours supervision for safety because of the overwhelming concern due to change.  Routed to Dr. Mickeal Skinner to advise if they should move up Monday 11/15 appointment or if sooner MRI is needed or if steroid dosing is warranted before Monday's appointment.  If medication is ordered please send to [facility] in [place] on file.

## 2020-04-12 ENCOUNTER — Inpatient Hospital Stay: Payer: Medicare PPO | Attending: Internal Medicine | Admitting: Internal Medicine

## 2020-04-12 ENCOUNTER — Other Ambulatory Visit: Payer: Self-pay

## 2020-04-12 ENCOUNTER — Inpatient Hospital Stay: Payer: Medicare PPO

## 2020-04-12 VITALS — BP 214/103 | HR 105 | Temp 98.0°F | Resp 18 | Ht 68.0 in | Wt 151.0 lb

## 2020-04-12 DIAGNOSIS — C719 Malignant neoplasm of brain, unspecified: Secondary | ICD-10-CM | POA: Diagnosis not present

## 2020-04-12 DIAGNOSIS — C713 Malignant neoplasm of parietal lobe: Secondary | ICD-10-CM | POA: Diagnosis not present

## 2020-04-12 LAB — CMP (CANCER CENTER ONLY)
ALT: 18 U/L (ref 0–44)
AST: 15 U/L (ref 15–41)
Albumin: 4.2 g/dL (ref 3.5–5.0)
Alkaline Phosphatase: 74 U/L (ref 38–126)
Anion gap: 10 (ref 5–15)
BUN: 21 mg/dL (ref 8–23)
CO2: 28 mmol/L (ref 22–32)
Calcium: 9.5 mg/dL (ref 8.9–10.3)
Chloride: 105 mmol/L (ref 98–111)
Creatinine: 0.9 mg/dL (ref 0.44–1.00)
GFR, Estimated: >60 mL/min (ref >60)
Glucose, Bld: 163 mg/dL — ABNORMAL HIGH (ref 70–99)
Potassium: 3.2 mmol/L — ABNORMAL LOW (ref 3.5–5.1)
Sodium: 143 mmol/L (ref 135–145)
Total Bilirubin: 0.7 mg/dL (ref 0.3–1.2)
Total Protein: 7.1 g/dL (ref 6.5–8.1)

## 2020-04-12 LAB — CBC WITH DIFFERENTIAL (CANCER CENTER ONLY)
Abs Immature Granulocytes: 0.31 10*3/uL — ABNORMAL HIGH (ref 0.00–0.07)
Basophils Absolute: 0.1 10*3/uL (ref 0.0–0.1)
Basophils Relative: 1 %
Eosinophils Absolute: 1.6 10*3/uL — ABNORMAL HIGH (ref 0.0–0.5)
Eosinophils Relative: 12 %
HCT: 45.1 % (ref 36.0–46.0)
Hemoglobin: 16.5 g/dL — ABNORMAL HIGH (ref 12.0–15.0)
Immature Granulocytes: 2 %
Lymphocytes Relative: 4 %
Lymphs Abs: 0.6 10*3/uL — ABNORMAL LOW (ref 0.7–4.0)
MCH: 29.7 pg (ref 26.0–34.0)
MCHC: 36.6 g/dL — ABNORMAL HIGH (ref 30.0–36.0)
MCV: 81.1 fL (ref 80.0–100.0)
Monocytes Absolute: 0.7 10*3/uL (ref 0.1–1.0)
Monocytes Relative: 5 %
Neutro Abs: 10.3 10*3/uL — ABNORMAL HIGH (ref 1.7–7.7)
Neutrophils Relative %: 76 %
Platelet Count: 307 10*3/uL (ref 150–400)
RBC: 5.56 MIL/uL — ABNORMAL HIGH (ref 3.87–5.11)
RDW: 13.2 % (ref 11.5–15.5)
WBC Count: 13.6 10*3/uL — ABNORMAL HIGH (ref 4.0–10.5)
nRBC: 0 % (ref 0.0–0.2)

## 2020-04-12 MED ORDER — TEMOZOLOMIDE 180 MG PO CAPS: 200.0000 mg/m2/d | ORAL_CAPSULE | Freq: Every day | ORAL | 0 refills | Status: DC

## 2020-04-12 MED ORDER — DEXAMETHASONE 4 MG PO TABS: 4.0000 mg | ORAL_TABLET | Freq: Every day | ORAL | 1 refills | Status: DC

## 2020-04-12 NOTE — Progress Notes (Signed)
[facility], [place] [address] [phone]   Interval Evaluation  Date of Service: 04/12/20 Patient Name: Wanda Ross Patient MRN: [id number] Patient DOB: 15-Apr-1951 Provider: Ventura Sellers, MD  Identifying Statement:  Wanda Ross is a 69 y.o. female with left frontal glioblastoma   Oncologic History: 11/26/19: Craniotomy, resection of L parietal mass with Dr. Marcello Moores; path is GBM 02/09/20: Completes IMRT and concurrent Temozolomide 67m/m2  Biomarkers:  MGMT Unknown.  IDH 1/2 Wild type.  EGFR Unknown  TERT Unknown   Interval History:  Wanda SURGESpresents today for follow up, now having completed first cycle of adjuvant Temodar.  Confusion, short term memory impairment, and gait imbalance are all considerably improved following decadron 449mtwice per day dosing.  She is nearly back at baseline, walking independently and solo for most ADLs.  No seizures or headaches.  Fatigue is stable from prior.  No issues tolerating Temodar.  H+P (12/15/19) Patient presented to medical attention in late June with several weeks of progressive symptoms, mainly characterized by gait impairment, confusion and disorientation.  CNS imaging was obtained which demonstrated 4-5cm mass in the left parietal region, consistent with likely brain brain tumor.  She underwent craniotomy and resection with Dr. ThMarcello Mooresn 11/26/19.  Following surgery she developed some right sided weakness, but this recovered to essentially full strength following nearly 2 weeks course of inpatient rehab.  Currently, she is living at home with her sister, although prior to this she was living alone.  She maintains full functional status, no limitations at present time.  Retired middle school principal.  Medications: Current Outpatient Medications on File Prior to Visit  Medication Sig Dispense Refill  . acetaminophen (TYLENOL) 325 MG tablet Take 2  tablets (650 mg total) by mouth every 4 (four) hours as needed for mild pain (temp > 100.5).    . Marland Kitchenlendronate (FOSAMAX) 70 MG tablet Take 1 tablet by mouth once a week.    . Marland KitchenmLODipine (NORVASC) 5 MG tablet Take 1 tablet (5 mg total) by mouth daily. 90 tablet 1  . Calcium Carbonate-Vit D-Min (CALCIUM 1200) 1200-1000 MG-UNIT CHEW Chew 1,200 mg by mouth daily. 30 tablet 1  . cloNIDine (CATAPRES) 0.3 MG tablet TAKE 1 TABLET BY MOUTH EVERYDAY AT BEDTIME 90 tablet 0  . Cyanocobalamin 2500 MCG TABS Take 2,500 mcg by mouth daily. 30 tablet 0  . docusate sodium (COLACE) 100 MG capsule Take 1 capsule (100 mg total) by mouth 2 (two) times daily. 10 capsule 0  . dorzolamide-timolol (COSOPT) 22.3-6.8 MG/ML ophthalmic solution Place 1 drop into the right eye 2 (two) times daily.     . Multiple Vitamins-Minerals (CENTRUM SILVER ULTRA WOMENS) TABS Take 1 tablet by mouth daily.     . pravastatin (PRAVACHOL) 10 MG tablet TAKE 1 TABLET BY MOUTH DAILY *DOSE REDUCTION* 90 tablet 1  . ondansetron (ZOFRAN) 8 MG tablet Take 1 tablet 2 (two) times daily as needed for nausea/vomiting. May take 30-60 minutes prior to Temodar administration if nausea/vomiting occurs (Patient not taking: Reported on 04/12/2020)    . ondansetron (ZOFRAN) 8 MG tablet Take 1 tablet (8 mg total) by mouth 2 (two) times daily as needed (nausea and vomiting). May take 30-60 minutes prior to Temodar administration if nausea/vomiting occurs. (Patient not taking: Reported on 04/12/2020) 30 tablet 1  . temozolomide (TEMODAR) 100 MG capsule Take 100 mg by mouth daily. May take on an empty stomach or at  bedtime to decrease nausea & vomiting. (Patient not taking: Reported on 02/10/2020)    . temozolomide (TEMODAR) 140 MG capsule Take 2 capsules (280 mg total) by mouth daily. May take on an empty stomach to decrease nausea & vomiting. (Patient not taking: Reported on 04/12/2020) 10 capsule 0  . temozolomide (TEMODAR) 20 MG capsule Take 20 mg by mouth daily. May  take on an empty stomach or at bedtime to decrease nausea & vomiting. (Patient not taking: Reported on 02/10/2020)     No current facility-administered medications on file prior to visit.    Allergies: No Known Allergies Past Medical History:  Past Medical History:  Diagnosis Date  . Glaucoma   . Hyperlipidemia 2010  . Hypertension 1990  . IGT (impaired glucose tolerance) 05/11/2010  . Multinodular goiter 2010   normal function   Past Surgical History:  Past Surgical History:  Procedure Laterality Date  . APPLICATION OF CRANIAL NAVIGATION Left 11/26/2019   Procedure: APPLICATION OF CRANIAL NAVIGATION;  Surgeon: [name], MD;  Location: [facility];  Service: Neurosurgery;  Laterality: Left;  APPLICATION OF CRANIAL NAVIGATION  . COLONOSCOPY N/A 06/16/2016   Procedure: COLONOSCOPY;  Surgeon: Danie Binder, MD;  [facility];  Service: Endoscopy;  Laterality: N/A;  9:30 AM  . COLONOSCOPY N/A 10/22/2019   Procedure: COLONOSCOPY;  Surgeon: Daneil Dolin, MD;  [facility];  Service: Endoscopy;  Laterality: N/A;  9:30  . CRANIOTOMY Left 11/26/2019   Procedure: LEFT PARIETAL CRANIOTOMY FOR RESECTION OF TUMOR;  Surgeon: [name], MD;  Location: [facility];  Service: Neurosurgery;  Laterality: Left;  LEFT PARIETAL CRANIOTOMY FOR RESECTION OF TUMOR  . EYE SURGERY  2017   3 on left eye 1 on right eye for glaucoma by Dr. Venetia Maxon   . EYE SURGERY  2018   Cataract Surgery by Dr. Arlina Robes   . OPERATIVE ULTRASOUND N/A 11/26/2019   Procedure: OPERATIVE ULTRASOUND;  Surgeon: [name], MD;  Location: [facility];  Service: Neurosurgery;  Laterality: N/A;  OPERATIVE ULTRASOUND   Social History:  Social History   Socioeconomic History  . Marital status: Widowed    Spouse name: Not on file  . Number of children: 3  . Years of education: Not on file  . Highest education level: Not on file  Occupational History  . Occupation: employed   Tobacco Use  . Smoking status:  Never Smoker  . Smokeless tobacco: Never Used  Vaping Use  . Vaping Use: Never used  Substance and Sexual Activity  . Alcohol use: No  . Drug use: No  . Sexual activity: Never    Birth control/protection: Post-menopausal  Other Topics Concern  . Not on file  Social History Narrative  . Not on file   Social Determinants of Health   Financial Resource Strain:   . Difficulty of Paying Living Expenses: Not on file  Food Insecurity:   . Worried About Charity fundraiser in the Last Year: Not on file  . Ran Out of Food in the Last Year: Not on file  Transportation Needs:   . Lack of Transportation (Medical): Not on file  . Lack of Transportation (Non-Medical): Not on file  Physical Activity:   . Days of Exercise per Week: Not on file  . Minutes of Exercise per Session: Not on file  Stress:   . Feeling of Stress : Not on file  Social Connections:   . Frequency of Communication with Friends and  Family: Not on file  . Frequency of Social Gatherings with Friends and Family: Not on file  . Attends Religious Services: Not on file  . Active Member of Clubs or Organizations: Not on file  . Attends Archivist Meetings: Not on file  . Marital Status: Not on file  Intimate Partner Violence:   . Fear of Current or Ex-Partner: Not on file  . Emotionally Abused: Not on file  . Physically Abused: Not on file  . Sexually Abused: Not on file   Family History:  Family History  Problem Relation Age of Onset  . Dementia Father   . Dementia Mother   . Diabetes Mother   . Hypertension Mother   . Breast cancer Sister   . Hypertension Sister   . Diabetes Brother     Review of Systems: Constitutional: Doesn't report fevers, chills or abnormal weight loss Eyes: Doesn't report blurriness of vision Ears, nose, mouth, throat, and face: Doesn't report sore throat Respiratory: Doesn't report cough, dyspnea or wheezes Cardiovascular: Doesn't report palpitation, chest discomfort   Gastrointestinal:  Doesn't report nausea, constipation, diarrhea GU: Doesn't report incontinence Skin: Doesn't report skin rashes Neurological: Per HPI Musculoskeletal: Doesn't report joint pain Behavioral/Psych: Doesn't report anxiety  Physical Exam: Vitals:   04/12/20 1024  BP: (!) 214/103  Pulse: (!) 105  Resp: 18  Temp: 98 F (36.7 C)  SpO2: 99%   KPS: 80. General: Alert, cooperative, pleasant, in no acute distress Head: Normal EENT: No conjunctival injection or scleral icterus.  Lungs: Resp effort normal Cardiac: Regular rate Abdomen: Non-distended abdomen Skin: No rashes cyanosis or petechiae. Extremities: No clubbing or edema  Neurologic Exam: Mental Status: Awake, alert, attentive to examiner. Oriented to self and environment. Language is fluent with intact comprehension.  Cranial Nerves: Visual acuity is grossly normal. Visual fields are full. Extra-ocular movements intact. No ptosis. Face is symmetric Motor: Tone and bulk are normal. Power is full in both arms and legs. Reflexes are symmetric, no pathologic reflexes present.  Sensory: Intact to light touch Gait: Modestly hemiparetic.   Labs: I have reviewed the data as listed    Component Value Date/Time   NA 143 04/12/2020 1002   K 3.2 (L) 04/12/2020 1002   CL 105 04/12/2020 1002   CO2 28 04/12/2020 1002   GLUCOSE 163 (H) 04/12/2020 1002   BUN 21 04/12/2020 1002   CREATININE 0.90 04/12/2020 1002   CREATININE 0.86 05/21/2019 0754   CALCIUM 9.5 04/12/2020 1002   PROT 7.1 04/12/2020 1002   ALBUMIN 4.2 04/12/2020 1002   AST 15 04/12/2020 1002   ALT 18 04/12/2020 1002   ALKPHOS 74 04/12/2020 1002   BILITOT 0.7 04/12/2020 1002   GFRNONAA >60 04/12/2020 1002   GFRNONAA 68 05/21/2019 0754   GFRAA >60 02/05/2020 1056   GFRAA 79 05/21/2019 0754   Lab Results  Component Value Date   WBC 13.6 (H) 04/12/2020   NEUTROABS 10.3 (H) 04/12/2020   HGB 16.5 (H) 04/12/2020   HCT 45.1 04/12/2020   MCV 81.1  04/12/2020   PLT 307 04/12/2020     Assessment/Plan Glioblastoma ([place]) [C71.9]  Wanda Ross is clinically stable today, now having completed 1st cycle of 5-day Temodar.  We recommended continuing treatment with cycle #2 Temozolomide dose increased to 200 mg/m2, on for five days and off for twenty three days in twenty eight day cycles. The patient will have a complete blood count performed on days 21 and 28 of each cycle, and a  comprehensive metabolic panel performed on day 28 of each cycle. Labs may need to be performed more often. Zofran will prescribed for home use for nausea/vomiting.  Chemotherapy should be held for the following:  ANC less than 1,000  Platelets less than 100,000  LFT or creatinine greater than 2x ULN  If clinical concerns/contraindications develop  Wanda Ross will return to clinic in 1 month with MRI brain for evaluation prior to cycle #3.  May decrease decadron to 7m daily.  All questions were answered. The patient knows to call the clinic with any problems, questions or concerns. No barriers to learning were detected.  I have spent a total of 40 minutes of face-to-face and non-face-to-face time, excluding clinical staff time, preparing to see patient, ordering tests and/or medications, counseling the patient, and independently interpreting results and communicating results to the patient/family/caregiver    ZVentura Sellers MD Medical Director of Neuro-Oncology [facility]11/15/21 3:30 PM

## 2020-04-13 ENCOUNTER — Telehealth: Payer: Self-pay | Admitting: Internal Medicine

## 2020-04-13 NOTE — Telephone Encounter (Signed)
Scheduled per 11/15 los. Pt is aware of appt times and date.

## 2020-04-16 ENCOUNTER — Other Ambulatory Visit: Payer: Self-pay | Admitting: Radiation Therapy

## 2020-05-14 ENCOUNTER — Inpatient Hospital Stay (HOSPITAL_COMMUNITY)
Admit: 2020-05-14 | Discharge: 2020-05-14 | Disposition: A | Discharge status: Home / Self Care | Payer: Medicare PPO | Attending: Internal Medicine | Admitting: Internal Medicine

## 2020-05-14 ENCOUNTER — Emergency Department (HOSPITAL_COMMUNITY): Payer: Medicare PPO

## 2020-05-14 ENCOUNTER — Inpatient Hospital Stay (HOSPITAL_COMMUNITY): Payer: Medicare PPO

## 2020-05-14 ENCOUNTER — Encounter (HOSPITAL_COMMUNITY): Payer: Self-pay

## 2020-05-14 ENCOUNTER — Other Ambulatory Visit: Payer: Medicare PPO

## 2020-05-14 ENCOUNTER — Inpatient Hospital Stay (HOSPITAL_COMMUNITY)
Admission: EM | Admit: 2020-05-14 | Discharge: 2020-05-19 | DRG: 100 | Disposition: A | Discharge status: Home Health | Payer: Medicare PPO | Attending: Internal Medicine | Admitting: Internal Medicine

## 2020-05-14 DIAGNOSIS — E872 Acidosis, unspecified: Secondary | ICD-10-CM

## 2020-05-14 DIAGNOSIS — M8580 Other specified disorders of bone density and structure, unspecified site: Secondary | ICD-10-CM | POA: Diagnosis present

## 2020-05-14 DIAGNOSIS — R092 Respiratory arrest: Secondary | ICD-10-CM | POA: Diagnosis not present

## 2020-05-14 DIAGNOSIS — T380X5A Adverse effect of glucocorticoids and synthetic analogues, initial encounter: Secondary | ICD-10-CM | POA: Diagnosis present

## 2020-05-14 DIAGNOSIS — L8990 Pressure ulcer of unspecified site, unspecified stage: Secondary | ICD-10-CM | POA: Insufficient documentation

## 2020-05-14 DIAGNOSIS — R404 Transient alteration of awareness: Secondary | ICD-10-CM | POA: Diagnosis not present

## 2020-05-14 DIAGNOSIS — J9811 Atelectasis: Secondary | ICD-10-CM | POA: Diagnosis not present

## 2020-05-14 DIAGNOSIS — C711 Malignant neoplasm of frontal lobe: Secondary | ICD-10-CM | POA: Diagnosis not present

## 2020-05-14 DIAGNOSIS — Z923 Personal history of irradiation: Secondary | ICD-10-CM

## 2020-05-14 DIAGNOSIS — Z8249 Family history of ischemic heart disease and other diseases of the circulatory system: Secondary | ICD-10-CM | POA: Diagnosis not present

## 2020-05-14 DIAGNOSIS — Z20822 Contact with and (suspected) exposure to covid-19: Secondary | ICD-10-CM | POA: Diagnosis present

## 2020-05-14 DIAGNOSIS — I10 Essential (primary) hypertension: Secondary | ICD-10-CM | POA: Diagnosis not present

## 2020-05-14 DIAGNOSIS — Z9221 Personal history of antineoplastic chemotherapy: Secondary | ICD-10-CM

## 2020-05-14 DIAGNOSIS — D696 Thrombocytopenia, unspecified: Secondary | ICD-10-CM | POA: Diagnosis present

## 2020-05-14 DIAGNOSIS — G9389 Other specified disorders of brain: Secondary | ICD-10-CM | POA: Diagnosis not present

## 2020-05-14 DIAGNOSIS — W19XXXA Unspecified fall, initial encounter: Secondary | ICD-10-CM | POA: Diagnosis present

## 2020-05-14 DIAGNOSIS — C719 Malignant neoplasm of brain, unspecified: Secondary | ICD-10-CM | POA: Diagnosis not present

## 2020-05-14 DIAGNOSIS — R739 Hyperglycemia, unspecified: Secondary | ICD-10-CM | POA: Diagnosis not present

## 2020-05-14 DIAGNOSIS — Z79899 Other long term (current) drug therapy: Secondary | ICD-10-CM

## 2020-05-14 DIAGNOSIS — E785 Hyperlipidemia, unspecified: Secondary | ICD-10-CM | POA: Diagnosis present

## 2020-05-14 DIAGNOSIS — E042 Nontoxic multinodular goiter: Secondary | ICD-10-CM | POA: Diagnosis present

## 2020-05-14 DIAGNOSIS — G40901 Epilepsy, unspecified, not intractable, with status epilepticus: Principal | ICD-10-CM | POA: Diagnosis present

## 2020-05-14 DIAGNOSIS — E876 Hypokalemia: Secondary | ICD-10-CM

## 2020-05-14 DIAGNOSIS — Z7189 Other specified counseling: Secondary | ICD-10-CM | POA: Diagnosis not present

## 2020-05-14 DIAGNOSIS — E782 Mixed hyperlipidemia: Secondary | ICD-10-CM

## 2020-05-14 DIAGNOSIS — H409 Unspecified glaucoma: Secondary | ICD-10-CM | POA: Diagnosis present

## 2020-05-14 DIAGNOSIS — R569 Unspecified convulsions: Secondary | ICD-10-CM

## 2020-05-14 DIAGNOSIS — R4020 Unspecified coma: Secondary | ICD-10-CM | POA: Diagnosis not present

## 2020-05-14 DIAGNOSIS — Z515 Encounter for palliative care: Secondary | ICD-10-CM

## 2020-05-14 DIAGNOSIS — R0902 Hypoxemia: Secondary | ICD-10-CM | POA: Diagnosis not present

## 2020-05-14 DIAGNOSIS — J9601 Acute respiratory failure with hypoxia: Secondary | ICD-10-CM | POA: Diagnosis not present

## 2020-05-14 DIAGNOSIS — G40909 Epilepsy, unspecified, not intractable, without status epilepticus: Secondary | ICD-10-CM | POA: Diagnosis not present

## 2020-05-14 DIAGNOSIS — Z85841 Personal history of malignant neoplasm of brain: Secondary | ICD-10-CM | POA: Diagnosis not present

## 2020-05-14 DIAGNOSIS — L89153 Pressure ulcer of sacral region, stage 3: Secondary | ICD-10-CM | POA: Diagnosis not present

## 2020-05-14 DIAGNOSIS — Z7983 Long term (current) use of bisphosphonates: Secondary | ICD-10-CM

## 2020-05-14 DIAGNOSIS — J9690 Respiratory failure, unspecified, unspecified whether with hypoxia or hypercapnia: Secondary | ICD-10-CM | POA: Diagnosis not present

## 2020-05-14 DIAGNOSIS — R7302 Impaired glucose tolerance (oral): Secondary | ICD-10-CM | POA: Diagnosis present

## 2020-05-14 DIAGNOSIS — S0990XA Unspecified injury of head, initial encounter: Secondary | ICD-10-CM | POA: Diagnosis not present

## 2020-05-14 LAB — CBC WITH DIFFERENTIAL/PLATELET
Abs Immature Granulocytes: 0.24 10*3/uL — ABNORMAL HIGH (ref 0.00–0.07)
Basophils Absolute: 0 10*3/uL (ref 0.0–0.1)
Basophils Relative: 0 %
Eosinophils Absolute: 0 10*3/uL (ref 0.0–0.5)
Eosinophils Relative: 0 %
HCT: 48.1 % — ABNORMAL HIGH (ref 36.0–46.0)
Hemoglobin: 16 g/dL — ABNORMAL HIGH (ref 12.0–15.0)
Immature Granulocytes: 5 %
Lymphocytes Relative: 36 %
Lymphs Abs: 1.7 10*3/uL (ref 0.7–4.0)
MCH: 31.1 pg (ref 26.0–34.0)
MCHC: 33.3 g/dL (ref 30.0–36.0)
MCV: 93.6 fL (ref 80.0–100.0)
Monocytes Absolute: 0.3 10*3/uL (ref 0.1–1.0)
Monocytes Relative: 7 %
Neutro Abs: 2.5 10*3/uL (ref 1.7–7.7)
Neutrophils Relative %: 52 %
Platelets: 55 10*3/uL — ABNORMAL LOW (ref 150–400)
RBC: 5.14 MIL/uL — ABNORMAL HIGH (ref 3.87–5.11)
RDW: 14.9 % (ref 11.5–15.5)
WBC: 4.8 10*3/uL (ref 4.0–10.5)
nRBC: 0.4 % — ABNORMAL HIGH (ref 0.0–0.2)

## 2020-05-14 LAB — GLUCOSE, CAPILLARY
Glucose-Capillary: 136 mg/dL — ABNORMAL HIGH (ref 70–99)
Glucose-Capillary: 153 mg/dL — ABNORMAL HIGH (ref 70–99)
Glucose-Capillary: 162 mg/dL — ABNORMAL HIGH (ref 70–99)
Glucose-Capillary: 171 mg/dL — ABNORMAL HIGH (ref 70–99)
Glucose-Capillary: 185 mg/dL — ABNORMAL HIGH (ref 70–99)

## 2020-05-14 LAB — RESP PANEL BY RT-PCR (FLU A&B, COVID) ARPGX2
Influenza A by PCR: NEGATIVE
Influenza B by PCR: NEGATIVE
SARS Coronavirus 2 by RT PCR: NEGATIVE

## 2020-05-14 LAB — COMPREHENSIVE METABOLIC PANEL
ALT: 40 U/L (ref 0–44)
AST: 27 U/L (ref 15–41)
Albumin: 3.8 g/dL (ref 3.5–5.0)
Alkaline Phosphatase: 64 U/L (ref 38–126)
Anion gap: 17 — ABNORMAL HIGH (ref 5–15)
BUN: 32 mg/dL — ABNORMAL HIGH (ref 8–23)
CO2: 21 mmol/L — ABNORMAL LOW (ref 22–32)
Calcium: 8.6 mg/dL — ABNORMAL LOW (ref 8.9–10.3)
Chloride: 102 mmol/L (ref 98–111)
Creatinine, Ser: 0.95 mg/dL (ref 0.44–1.00)
GFR, Estimated: >60 mL/min (ref >60)
Glucose, Bld: 219 mg/dL — ABNORMAL HIGH (ref 70–99)
Potassium: 3.3 mmol/L — ABNORMAL LOW (ref 3.5–5.1)
Sodium: 140 mmol/L (ref 135–145)
Total Bilirubin: 0.6 mg/dL (ref 0.3–1.2)
Total Protein: 6.6 g/dL (ref 6.5–8.1)

## 2020-05-14 LAB — URINALYSIS, ROUTINE W REFLEX MICROSCOPIC
Bacteria, UA: NONE SEEN
Bilirubin Urine: NEGATIVE
Glucose, UA: >=500 mg/dL — AB
Hgb urine dipstick: NEGATIVE
Ketones, ur: NEGATIVE mg/dL
Leukocytes,Ua: NEGATIVE
Nitrite: NEGATIVE
Protein, ur: 100 mg/dL — AB
Specific Gravity, Urine: 1.018 (ref 1.005–1.030)
pH: 5 (ref 5.0–8.0)

## 2020-05-14 LAB — BLOOD GAS, ARTERIAL
Acid-Base Excess: 0.5 mmol/L (ref 0.0–2.0)
Bicarbonate: 24.5 mmol/L (ref 20.0–28.0)
Drawn by: 22223
FIO2: 100
O2 Saturation: 99.3 %
PEEP: 5 cmH2O
Patient temperature: 37
RATE: 18 resp/min
pCO2 arterial: 44.7 mmHg (ref 32.0–48.0)
pH, Arterial: 7.369 (ref 7.350–7.450)
pO2, Arterial: 438 mmHg — ABNORMAL HIGH (ref 83.0–108.0)

## 2020-05-14 LAB — CK: Total CK: 46 U/L (ref 38–234)

## 2020-05-14 LAB — MAGNESIUM
Magnesium: 2.3 mg/dL (ref 1.7–2.4)
Magnesium: 2.3 mg/dL (ref 1.7–2.4)
Magnesium: 2.2 mg/dL (ref 1.7–2.4)

## 2020-05-14 LAB — RAPID URINE DRUG SCREEN, HOSP PERFORMED
Amphetamines: NOT DETECTED
Barbiturates: NOT DETECTED
Benzodiazepines: POSITIVE — AB
Cocaine: NOT DETECTED
Opiates: NOT DETECTED
Tetrahydrocannabinol: NOT DETECTED

## 2020-05-14 LAB — TROPONIN I (HIGH SENSITIVITY)
Troponin I (High Sensitivity): 10 ng/L (ref <18)
Troponin I (High Sensitivity): 18 ng/L — ABNORMAL HIGH (ref <18)

## 2020-05-14 LAB — HEMOGLOBIN A1C
Hgb A1c MFr Bld: 6.6 % — ABNORMAL HIGH (ref 4.8–5.6)
Hgb A1c MFr Bld: 6.5 % — ABNORMAL HIGH (ref 4.8–5.6)
Mean Plasma Glucose: 142.72 mg/dL
Mean Plasma Glucose: 139.85 mg/dL

## 2020-05-14 LAB — CBG MONITORING, ED: Glucose-Capillary: 238 mg/dL — ABNORMAL HIGH (ref 70–99)

## 2020-05-14 LAB — MRSA PCR SCREENING: MRSA by PCR: NEGATIVE

## 2020-05-14 LAB — LACTIC ACID, PLASMA
Lactic Acid, Venous: 6.1 mmol/L (ref 0.5–1.9)
Lactic Acid, Venous: 2.8 mmol/L (ref 0.5–1.9)

## 2020-05-14 LAB — PHOSPHORUS
Phosphorus: 3.7 mg/dL (ref 2.5–4.6)
Phosphorus: 3.7 mg/dL (ref 2.5–4.6)

## 2020-05-14 MED ORDER — DEXAMETHASONE SODIUM PHOSPHATE 10 MG/ML IJ SOLN
10.0000 mg | Freq: Once | INTRAMUSCULAR | Status: AC
  Administered 2020-05-14: 10 mg via INTRAVENOUS
  Filled 2020-05-14: qty 1

## 2020-05-14 MED ORDER — GADOBUTROL 1 MMOL/ML IV SOLN
7.0000 mL | Freq: Once | INTRAVENOUS | Status: AC | PRN
  Administered 2020-05-14: 7 mL via INTRAVENOUS

## 2020-05-14 MED ORDER — FENTANYL 2500MCG IN NS 250ML (10MCG/ML) PREMIX INFUSION
0.0000 ug/h | INTRAVENOUS | Status: DC
  Administered 2020-05-14: 25 ug/h via INTRAVENOUS
  Administered 2020-05-16: 75 ug/h via INTRAVENOUS
  Filled 2020-05-14 (×2): qty 250

## 2020-05-14 MED ORDER — MIDAZOLAM HCL 2 MG/2ML IJ SOLN
1.0000 mg | INTRAMUSCULAR | Status: DC | PRN
  Administered 2020-05-14: 1 mg via INTRAVENOUS
  Filled 2020-05-14 (×2): qty 2

## 2020-05-14 MED ORDER — INSULIN ASPART 100 UNIT/ML ~~LOC~~ SOLN
0.0000 [IU] | SUBCUTANEOUS | Status: DC
  Administered 2020-05-14: 3 [IU] via SUBCUTANEOUS
  Administered 2020-05-14: 2 [IU] via SUBCUTANEOUS
  Administered 2020-05-14 – 2020-05-15 (×3): 3 [IU] via SUBCUTANEOUS
  Administered 2020-05-15 (×2): 2 [IU] via SUBCUTANEOUS
  Administered 2020-05-15 – 2020-05-16 (×3): 3 [IU] via SUBCUTANEOUS
  Administered 2020-05-16 – 2020-05-17 (×4): 2 [IU] via SUBCUTANEOUS

## 2020-05-14 MED ORDER — PROPOFOL 1000 MG/100ML IV EMUL
5.0000 ug/kg/min | INTRAVENOUS | Status: DC
  Administered 2020-05-14: 55 ug/kg/min via INTRAVENOUS
  Administered 2020-05-14: 5 ug/kg/min via INTRAVENOUS
  Filled 2020-05-14: qty 200
  Filled 2020-05-14: qty 100

## 2020-05-14 MED ORDER — FENTANYL CITRATE (PF) 100 MCG/2ML IJ SOLN
25.0000 ug | INTRAMUSCULAR | Status: DC | PRN
  Administered 2020-05-15 – 2020-05-16 (×4): 25 ug via INTRAVENOUS

## 2020-05-14 MED ORDER — PROSOURCE TF PO LIQD: 45.0000 mL | Freq: Two times a day (BID) | ORAL | Status: DC

## 2020-05-14 MED ORDER — POTASSIUM CHLORIDE 10 MEQ/100ML IV SOLN
10.0000 meq | INTRAVENOUS | Status: AC
  Administered 2020-05-14 (×2): 10 meq via INTRAVENOUS
  Filled 2020-05-14 (×2): qty 100

## 2020-05-14 MED ORDER — MIDAZOLAM HCL 2 MG/2ML IJ SOLN
1.0000 mg | INTRAMUSCULAR | Status: DC | PRN
  Administered 2020-05-14 – 2020-05-16 (×2): 1 mg via INTRAVENOUS
  Filled 2020-05-14 (×2): qty 2

## 2020-05-14 MED ORDER — SODIUM CHLORIDE 0.9 % IV SOLN
2000.0000 mg | INTRAVENOUS | Status: AC
  Administered 2020-05-14: 2000 mg via INTRAVENOUS
  Filled 2020-05-14: qty 20

## 2020-05-14 MED ORDER — CHLORHEXIDINE GLUCONATE 0.12% ORAL RINSE (MEDLINE KIT)
15.0000 mL | Freq: Two times a day (BID) | OROMUCOSAL | Status: DC
  Administered 2020-05-14 – 2020-05-17 (×8): 15 mL via OROMUCOSAL

## 2020-05-14 MED ORDER — PROSOURCE TF PO LIQD
45.0000 mL | Freq: Every day | ORAL | Status: DC
  Administered 2020-05-14 – 2020-05-16 (×10): 45 mL
  Filled 2020-05-14 (×9): qty 45

## 2020-05-14 MED ORDER — LORAZEPAM 2 MG/ML IJ SOLN
2.0000 mg | Freq: Once | INTRAMUSCULAR | Status: AC
  Administered 2020-05-14: 2 mg via INTRAVENOUS
  Filled 2020-05-14: qty 1

## 2020-05-14 MED ORDER — FAMOTIDINE IN NACL 20-0.9 MG/50ML-% IV SOLN
20.0000 mg | Freq: Two times a day (BID) | INTRAVENOUS | Status: DC
  Administered 2020-05-14 – 2020-05-17 (×7): 20 mg via INTRAVENOUS
  Filled 2020-05-14 (×8): qty 50

## 2020-05-14 MED ORDER — LEVETIRACETAM IN NACL 1000 MG/100ML IV SOLN
1000.0000 mg | Freq: Once | INTRAVENOUS | Status: AC
  Administered 2020-05-14: 1000 mg via INTRAVENOUS
  Filled 2020-05-14: qty 100

## 2020-05-14 MED ORDER — INSULIN ASPART 100 UNIT/ML ~~LOC~~ SOLN
0.0000 [IU] | SUBCUTANEOUS | Status: DC
  Administered 2020-05-14: 3 [IU] via SUBCUTANEOUS

## 2020-05-14 MED ORDER — ADULT MULTIVITAMIN W/MINERALS CH
1.0000 | ORAL_TABLET | Freq: Every day | ORAL | Status: DC
  Administered 2020-05-14 – 2020-05-19 (×6): 1
  Filled 2020-05-14 (×6): qty 1

## 2020-05-14 MED ORDER — DEXAMETHASONE SODIUM PHOSPHATE 4 MG/ML IJ SOLN
4.0000 mg | Freq: Four times a day (QID) | INTRAMUSCULAR | Status: DC
  Administered 2020-05-14 – 2020-05-17 (×13): 4 mg via INTRAVENOUS
  Filled 2020-05-14 (×13): qty 1

## 2020-05-14 MED ORDER — LEVETIRACETAM IN NACL 1000 MG/100ML IV SOLN
1000.0000 mg | Freq: Two times a day (BID) | INTRAVENOUS | Status: DC
  Administered 2020-05-14 – 2020-05-17 (×6): 1000 mg via INTRAVENOUS
  Filled 2020-05-14 (×7): qty 100

## 2020-05-14 MED ORDER — POLYETHYLENE GLYCOL 3350 17 G PO PACK
17.0000 g | PACK | Freq: Every day | ORAL | Status: DC
  Administered 2020-05-14 – 2020-05-17 (×4): 17 g
  Filled 2020-05-14 (×4): qty 1

## 2020-05-14 MED ORDER — CHLORHEXIDINE GLUCONATE CLOTH 2 % EX PADS
6.0000 | MEDICATED_PAD | Freq: Every day | CUTANEOUS | Status: DC
  Administered 2020-05-14 – 2020-05-19 (×6): 6 via TOPICAL

## 2020-05-14 MED ORDER — PROPOFOL 1000 MG/100ML IV EMUL
0.0000 ug/kg/min | INTRAVENOUS | Status: DC
  Administered 2020-05-14: 35 ug/kg/min via INTRAVENOUS
  Administered 2020-05-14: 30 ug/kg/min via INTRAVENOUS
  Administered 2020-05-15: 35 ug/kg/min via INTRAVENOUS
  Administered 2020-05-15: 40 ug/kg/min via INTRAVENOUS
  Administered 2020-05-16: 25 ug/kg/min via INTRAVENOUS
  Administered 2020-05-16: 30 ug/kg/min via INTRAVENOUS
  Filled 2020-05-14: qty 200
  Filled 2020-05-14: qty 100
  Filled 2020-05-14: qty 200
  Filled 2020-05-14 (×3): qty 100

## 2020-05-14 MED ORDER — VITAL HIGH PROTEIN PO LIQD: 1000.0000 mL | ORAL | Status: DC

## 2020-05-14 MED ORDER — VITAL HIGH PROTEIN PO LIQD
1000.0000 mL | ORAL | Status: DC
  Administered 2020-05-14 – 2020-05-15 (×2): 1000 mL

## 2020-05-14 MED ORDER — FENTANYL CITRATE (PF) 100 MCG/2ML IJ SOLN: 25.0000 ug | INTRAMUSCULAR | Status: DC | PRN

## 2020-05-14 MED ORDER — SODIUM CHLORIDE 0.9 % IV SOLN: Freq: Once | INTRAVENOUS | Status: AC

## 2020-05-14 MED ORDER — DOCUSATE SODIUM 50 MG/5ML PO LIQD
100.0000 mg | Freq: Two times a day (BID) | ORAL | Status: DC
  Administered 2020-05-14 – 2020-05-17 (×7): 100 mg
  Filled 2020-05-14 (×9): qty 10

## 2020-05-14 MED ORDER — ORAL CARE MOUTH RINSE
15.0000 mL | OROMUCOSAL | Status: DC
  Administered 2020-05-14 – 2020-05-17 (×34): 15 mL via OROMUCOSAL

## 2020-05-14 MED ORDER — FREE WATER
100.0000 mL | Status: DC
  Administered 2020-05-14 – 2020-05-16 (×12): 100 mL

## 2020-05-14 NOTE — ED Notes (Signed)
Back from CT with pt.

## 2020-05-14 NOTE — Care Plan (Signed)
Patient was seen in the ICU,  Patient is intubated and under ICU care.  Once patient is extubated and out of ICU.  we will take over.

## 2020-05-14 NOTE — Plan of Care (Signed)
RN will continue to monitor patient's progression of care plan.  

## 2020-05-14 NOTE — Progress Notes (Signed)
Pt. returned from MRI at this time, no complications.

## 2020-05-14 NOTE — Consult Note (Addendum)
NEURO HOSPITALIST CONSULT NOTE   Requesting physician: Dr. Lake Bells   Reason for Consult: Seizure   History obtained from: Chart/ daughter HPI:                                                                                                                                          Wanda Ross is an 69 y.o. female with a past medical history significant for left frontal glioblastoma (s/p resection 10/2019, radiation and temozolomide), hypertension, hyperlipidemia, prediabetes, multinodular goiter, glaucoma who presented to  [facility] with seizure and transferred to [facility].   She presented to [facility] after a witnessed seizure at home.  EMS noted generalized tonic activity with no clonic activity.  Midazolam was administered resulting in apnea requiring bagging ventilation until arrival in the ED at which point she was intubated and started on a propofol drip. Per daughter: no more seizure activity since coming to hospital. No prior history of seizure.   Pertinent home medications 4 mg daily dexamethasone temozolomide 360 mg daily  Initial labs were notable for: Mild hypokalemia to 3.3 Mild hyperglycemia to 219 Mildly elevated BUN at 32 Anion gap of 17 Thrombocytopenia to 55 (acute from baseline of 307 on 11/15, potentially chemotherapy related) Lactate of 6.1 UA negative for infection Drug screen positive only for benzos given by EMS Respiratory panel negative  Head CT personally reviewed, no acute intracranial abnormality  Chest x-ray with left base atelectasis  She was treated with 1 g of IV Keppra and 10 mg IV dexamethasone She remains intubated and sedated on propofol and fentanyl with minimal responsiveness Past Medical History:  Diagnosis Date  . Glaucoma   . Hyperlipidemia 2010  . Hypertension 1990  . IGT (impaired glucose tolerance) 05/11/2010  . Multinodular goiter 2010   normal function    Past Surgical History:  Procedure  Laterality Date  . APPLICATION OF CRANIAL NAVIGATION Left 11/26/2019   Procedure: APPLICATION OF CRANIAL NAVIGATION;  Surgeon: [name], MD;  Location: [place];  Service: Neurosurgery;  Laterality: Left;  APPLICATION OF CRANIAL NAVIGATION  . COLONOSCOPY N/A 06/16/2016   Procedure: COLONOSCOPY;  Surgeon: Danie Binder, MD;  [facility];  Service: Endoscopy;  Laterality: N/A;  9:30 AM  . COLONOSCOPY N/A 10/22/2019   Procedure: COLONOSCOPY;  Surgeon: Daneil Dolin, MD;  [facility];  Service: Endoscopy;  Laterality: N/A;  9:30  . CRANIOTOMY Left 11/26/2019   Procedure: LEFT PARIETAL CRANIOTOMY FOR RESECTION OF TUMOR;  Surgeon: [name], MD;  Location: [facility];  Service: Neurosurgery;  Laterality: Left;  LEFT PARIETAL CRANIOTOMY FOR RESECTION OF TUMOR  . EYE SURGERY  2017   3 on left eye 1 on right eye for glaucoma by Dr. Venetia Maxon   .  EYE SURGERY  2018   Cataract Surgery by Dr. Arlina Robes   . OPERATIVE ULTRASOUND N/A 11/26/2019   Procedure: OPERATIVE ULTRASOUND;  Surgeon: [name], MD;  Location: [facility];  Service: Neurosurgery;  Laterality: N/A;  OPERATIVE ULTRASOUND    Family History  Problem Relation Age of Onset  . Dementia Father   . Dementia Mother   . Diabetes Mother   . Hypertension Mother   . Breast cancer Sister   . Hypertension Sister   . Diabetes Brother        Social History:  reports that she has never smoked. She has never used smokeless tobacco. She reports that she does not drink alcohol and does not use drugs.  No Known Allergies  MEDICATIONS:                                                                                                                     Scheduled: . chlorhexidine gluconate (MEDLINE KIT)  15 mL Mouth Rinse BID  . Chlorhexidine Gluconate Cloth  6 each Topical Daily  . dexamethasone (DECADRON) injection  4 mg Intravenous Q6H  . docusate  100 mg Per Tube BID  . feeding supplement (PROSource TF)  45 mL Per Tube  5 X Daily  . feeding supplement (VITAL HIGH PROTEIN)  1,000 mL Per Tube Q24H  . free water  100 mL Per Tube Q4H  . insulin aspart  0-15 Units Subcutaneous Q4H  . mouth rinse  15 mL Mouth Rinse 10 times per day  . multivitamin with minerals  1 tablet Per Tube Daily  . polyethylene glycol  17 g Per Tube Daily   Continuous: . famotidine (PEPCID) IV Stopped (05/14/20 1230)  . fentaNYL infusion INTRAVENOUS 25 mcg/hr (05/14/20 1242)  . levETIRAcetam    . propofol (DIPRIVAN) infusion 15 mcg/kg/min (05/14/20 1501)   LAG:TXMIWOEH (SUBLIMAZE) injection, fentaNYL (SUBLIMAZE) injection, midazolam, midazolam   ROS:                                                                                                                                       History  unobtainable from patient due to mental status and intubation    Blood pressure 126/65, pulse 69, temperature 97.9 F (36.6 C), temperature source Axillary, resp. rate 18, height '5\' 6"'  (1.676 m), weight 70.6 kg, SpO2 100 %.   General Examination:  Physical Exam  Constitutional: Appears well-developed and well-nourished.  Eyes: Normal external eye and conjunctiva. HENT: Normocephalic, no lesions, without obvious abnormality.   Musculoskeletal-no joint tenderness, deformity or swelling Cardiovascular: Normal rate and regular rhythm.  Respiratory: Effort normal, non-labored breathing saturations WNL intubated. GI: Soft.  No distension. There is no tenderness.  Skin: WDI  Neurological Examination off sedation for ~ 20 minutes Mental Status: Awake, nor verbalizations noted. Moving all BUE extremities spontaneously. Unable to follow commands. Cranial Nerves: Able to cross midline, tracks examiner though she does have a left gaze preference. PERRL 4 <2  Face appears symmetric in presence of ETT. Motor: BUE able to raise anti gravity BLE  2/5 Tone and bulk:normal tone throughout; no atrophy noted Deep Tendon Reflexes: 3+ and symmetric throughout Plantars: Right: upgoing   Left: upgoing Cerebellar: UTA Gait: deferred   Lab Results: Basic Metabolic Panel: Recent Labs  Lab 05/14/20 0059 05/14/20 0346 05/14/20 1045  NA 140  --   --   K 3.3*  --   --   CL 102  --   --   CO2 21*  --   --   GLUCOSE 219*  --   --   BUN 32*  --   --   CREATININE 0.95  --   --   CALCIUM 8.6*  --   --   MG  --  2.3 2.3  PHOS  --   --  3.7    CBC: Recent Labs  Lab 05/14/20 0059  WBC 4.8  NEUTROABS 2.5  HGB 16.0*  HCT 48.1*  MCV 93.6  PLT 55*    Cardiac Enzymes: Recent Labs  Lab 05/14/20 0346  CKTOTAL 46    Imaging: EEG  Result Date: 05/14/2020 Lora Havens, MD     05/14/2020  2:32 PM Patient Name: CHINWE LOPE MRN: [id number] Epilepsy Attending: Lora Havens Referring Physician/Provider: Dr Lesleigh Noe Date: 05/14/2020 Duration: 23.13 mins Patient history: 69yo F with 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 EEG to evaluate for seizure. Level of alertness:  comatose AEDs during EEG study: LEV, propofol Technical aspects: This EEG study was done with scalp electrodes positioned according to the 10-20 International system of electrode placement. Electrical activity was acquired at a sampling rate of '500Hz'  and reviewed with a high frequency filter of '70Hz'  and a low frequency filter of '1Hz' . EEG data were recorded continuously and digitally stored. Description: EEG showed continuous generalized polymorphic frequencies with predominantly 3 to 6 Hz theta-delta slowing as well as 15-'18hz'  generalized beta activity. Hyperventilation and photic stimulation were not performed.   ABNORMALITY -Continuous slow, generalized IMPRESSION: This study is suggestive of severe diffuse encephalopathy, nonspecific etiology but likely related to sedation. No seizures or epileptiform discharges were seen throughout the recording. Lora Havens   CT Head Wo Contrast  Result Date: 05/14/2020 CLINICAL DATA:  Seizure EXAM: CT HEAD WITHOUT CONTRAST TECHNIQUE: Contiguous axial images were obtained from the base of the skull through the vertex without intravenous contrast. COMPARISON:  11/22/2019 FINDINGS: Brain: Postoperative changes of prior brain tumor resection centered at the paramedian left parietal lobe and corpus callosum splenium. No intracranial hemorrhage or extra-axial collection. No acute abnormality. Vascular: No abnormal hyperdensity of the major intracranial arteries or dural venous sinuses. No intracranial atherosclerosis. Skull: Remote left parietal craniotomy. Sinuses/Orbits: No fluid levels or advanced mucosal thickening of the visualized paranasal sinuses. No mastoid or middle ear effusion. The orbits are normal. IMPRESSION: 1. No acute intracranial abnormality. 2. Postoperative  changes of prior brain tumor resection. Electronically Signed   By: Ulyses Jarred M.D.   On: 05/14/2020 03:03   DG Chest Port 1 View  Result Date: 05/14/2020 CLINICAL DATA:  Post intubation EXAM: PORTABLE CHEST 1 VIEW COMPARISON:  11/22/2019 FINDINGS: Endotracheal tube is 2 cm above the carina. NG tube is in the stomach. Heart is normal size. Left basilar opacity, likely atelectasis. Right lung clear. No effusions or pneumothorax. IMPRESSION: Support devices as above. Left base atelectasis. Electronically Signed   By: Rolm Baptise M.D.   On: 05/14/2020 01:41    Assessment: 69 year old female with PMH significant for left frontal glioblastoma (s/p resection 10/2019, radiation and temozolomide), hypertension, hyperlipidemia, prediabetes, multinodular goiter, glaucoma who transferred from [facility] to [facility] with a seizure. EEG negative for seizure or epileptiform discharges, diffuse encephalopathy. The [place] did not show anything acute; post-op changes of prior brain tumor resection.  Recommendations: -2 g IV Keppra now (10 AM) -1 g Keppra twice daily  starting 10 PM -Dexamethasone 4 mg every 6 hours -Wean sedation and continue to carefully monitor neurological examination; every hour neuro checks -extubation per primary team, please note that the patient's ability to process language and communicate may be delayed due to the location of her tumor/seizure (post-ictal Todds phenomenon). -Neurology will sign off at this time, please repage if patient does not continue to improve or if new neurological questions arise  Laurey Morale, MSN, NP-C [facility] [phone]  Attending neurologist's note to follow  Attending Neurologist's note:  I personally saw this patient, gathering history, performing a full neurologic examination, reviewing relevant labs, personally reviewing relevant imaging including HCT, and formulated the assessment and plan, adding the note above for completeness and clarity to accurately reflect my thoughts  Lesleigh Noe MD-PhD [facility] [phone]    05/14/2020, 3:35 PM

## 2020-05-14 NOTE — ED Notes (Signed)
Daughter [name] [phone] ) called and updated on pt and gave new room number

## 2020-05-14 NOTE — Progress Notes (Signed)
Patient arrived on unit at [phone], patient is currently intubated and sedated. Vitals are stable. Will continue to monitor

## 2020-05-14 NOTE — ED Notes (Signed)
Pt blood glucose 238

## 2020-05-14 NOTE — Plan of Care (Signed)
Discussed with Dr. Lake Bells, this is a 69 year old woman with a past medical history significant for left frontal glioblastoma (s/p resection 10/2019, radiation and temozolomide), hypertension, hyperlipidemia, prediabetes, multinodular goiter, glaucoma  She presented to [facility] after a witnessed seizure at home.  EMS noted generalized tonic activity with no clonic activity.  Midazolam was administered resulting in apnea requiring bagging ventilation until arrival in the ED at which point she was intubated and started on a propofol drip.  Pertinent home medications 4 mg daily dexamethasone  Initial labs were notable for Mild hypokalemia to 3.3 Mild hyperglycemia to 219 Mildly elevated BUN at 32 Anion gap of 17 Thrombocytopenia to 55 (acute from baseline of 307 on 11/15, potentially chemotherapy related) Lactate of 6.1 UA negative for infection Drug screen positive only for benzos given by EMS Respiratory panel negative  Head CT personally reviewed, no acute intracranial abnormality  Chest x-ray with left base atelectasis  She was treated with 1 g of IV Keppra and 10 mg IV dexamethasone She remains intubated and sedated on propofol and fentanyl with minimal responsiveness  Initial recommendations -2 g IV Keppra now (10 AM) -1 g Keppra twice daily starting 10 PM -Dexamethasone 4 mg every 6 hours -Wean sedation and continue to carefully monitor neurological examination; every hour neuro checks -Stat EEG -May need transfer to [facility] if mental status does not improve as propofol is weaned, or if EEG shows concern for ongoing seizure activity, is in this case long-term monitoring would be needed  Please see subsequent note for full eval and recs

## 2020-05-14 NOTE — ED Notes (Signed)
20 of etomidate at @ 2353 100 of succ @ 2354 Tubed @ 3533

## 2020-05-14 NOTE — Procedures (Signed)
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 EEG to evaluate for seizure.   Level of alertness:  comatose  AEDs during EEG study: LEV, propofol  Technical aspects: This EEG study was done with scalp electrodes positioned according to the 10-20 International system of electrode placement. Electrical activity was acquired at a sampling rate of 500Hz  and reviewed with a high frequency filter of 70Hz  and a low frequency filter of 1Hz . EEG data were recorded continuously and digitally stored.   Description: EEG showed continuous generalized polymorphic frequencies with predominantly 3 to 6 Hz theta-delta slowing as well as 15-18hz  generalized beta activity. Hyperventilation and photic stimulation were not performed.     ABNORMALITY -Continuous slow, generalized  IMPRESSION: This study is suggestive of severe diffuse encephalopathy, nonspecific etiology but likely related to sedation. No seizures or epileptiform discharges were seen throughout the recording.  Citlali Gautney Barbra Sarks

## 2020-05-14 NOTE — Progress Notes (Signed)
EEG complete - results pending 

## 2020-05-14 NOTE — ED Notes (Signed)
Lactic acid 6.1 reported to [place] at this time .

## 2020-05-14 NOTE — Progress Notes (Signed)
Initial Nutrition Assessment  DOCUMENTATION CODES:   Not applicable  INTERVENTION:  - will order TF regimen: Vital High Protein @ 30 ml/hr with 45 ml Prosource TF x5/day and 100 ml free water every 4 hours. - this regimen + kcal from current propofol rate will provide 1371 kcal (96% estimated kcal need), 118 grams protein, and 1202 ml free water.  - will order 1 tablet multivitamin with minerals per OGT/day d/t TF rate <42 ml/hr.    NUTRITION DIAGNOSIS:   Inadequate oral intake related to inability to eat as evidenced by NPO status.  GOAL:   Patient will meet greater than or equal to 90% of their needs  MONITOR:   Vent status,TF tolerance,Labs,Weight trends,Skin  REASON FOR ASSESSMENT:   Ventilator,Consult Enteral/tube feeding initiation and management  ASSESSMENT:   69 year-old female with medical history of HTN, HLD, goiter, glioblastoma, impaired glucose tolerance, and glaucoma. She [facility] to [facility] ED on 12/17 after having two seizures at home. She also had a third seizure while in the care of EMS. She was intubated in the ED and transferred to [facility].  Patient intubated, sedated, with OGT in place; no abdominal xray done but CXR report from today states that OGT tip is in the stomach.  Able to talk with RN. Daughter is at bedside but does not awake during RD visit despite RD talking with other staff in the patient's room. RN reports that patient has several children who rotate staying with/taking care of her so patient is very rarely/never alone.   Planning for MRI and EEG today.   Weight today is 156 lb and weight has been stable (149-151 lb) 12/16/19-04/12/20.    Patient is currently intubated on ventilator support MV: 8.5 L/min Temp (24hrs), Avg:98.2 F (36.8 C), Min:97.5 F (36.4 C), Max:98.6 F (37 C) Propofol: 17.1 ml/hr (451 kcal) BP: 141/86 and MAP: 103  Labs reviewed; CBG: 171 mg/dl, K: 3 mmol/l, BUN: 32 mg/dl, Ca: 8.6 mg/dl. Medications reviewed; 100  mg colace BID, 20 mg IV pepcid BID, sliding scale novolog, 17 g miralax/day, 10 mEq IV KCl x2 runs 12/17. Drips; propofol @ 45 mcg/kg/min, fentanyl @ 25 mcg/hr.     NUTRITION - FOCUSED PHYSICAL EXAM:  Flowsheet Row Most Recent Value  Orbital Region No depletion  Upper Arm Region No depletion  Thoracic and Lumbar Region No depletion  Buccal Region No depletion  Temple Region No depletion  Clavicle Bone Region Mild depletion  Clavicle and Acromion Bone Region Mild depletion  Scapular Bone Region No depletion  Dorsal Hand No depletion  Patellar Region No depletion  Anterior Thigh Region Mild depletion  Posterior Calf Region Mild depletion  Edema (RD Assessment) None  Hair Reviewed  Eyes Unable to assess  Mouth Unable to assess  Skin Reviewed  Nails Reviewed       Diet Order:   Diet Order            Diet NPO time specified  Diet effective now                 EDUCATION NEEDS:   Not appropriate for education at this time  Skin:  Skin Assessment: Skin Integrity Issues: Skin Integrity Issues:: Stage III Stage III: coccyx  Last BM:  PTA/unknown  Height:   Ht Readings from Last 1 Encounters:  05/14/20 5\' 6"  (1.676 m)    Weight:   Wt Readings from Last 1 Encounters:  05/14/20 70.6 kg    Estimated Nutritional Needs:  Kcal:  1423 kcal Protein:  110-125 grams Fluid:  >/= 1.8 L/day      Jarome Matin, MS, RD, LDN, CNSC Inpatient Clinical Dietitian RD pager # available in [facility]  After hours/weekend pager # available in [facility]

## 2020-05-14 NOTE — ED Provider Notes (Signed)
[facility] EMERGENCY DEPARTMENT Provider Note   CSN: [id number] Arrival date & time: 05/14/20  5726   History Chief Complaint  Patient presents with  . Seizures    Wanda Ross is a 69 y.o. female.  The history is provided by the EMS personnel. The history is limited by the condition of the patient (Patient unresponsive).  Seizures She has history of hypertension, hyperlipidemia, glioblastoma and is brought in by ambulance following a fall at home and seizure in the ambulance.  Family reports hearing a patient fall and finding her obtunded with slurred speech.  EMS was called and noted patient was unresponsive.  She had a seizure while in the ambulance consisting of generalized tonic activity with no clonic activity.  She was given midazolam and became apneic following that.  They continued to transport her with bag-valve-mask respirations.  Past Medical History:  Diagnosis Date  . Glaucoma   . Hyperlipidemia 2010  . Hypertension 1990  . IGT (impaired glucose tolerance) 05/11/2010  . Multinodular goiter 2010   normal function    Patient Active Problem List   Diagnosis Date Noted  . Goals of care, counseling/discussion 12/15/2019  . Glioblastoma ([place])   . Hypoalbuminemia due to protein-calorie malnutrition ([place])   . Transaminitis   . Benign essential HTN   . Prediabetes   . Seizure prophylaxis   . Glioma ([place]) 12/02/2019  . Right sided weakness   . Dyslipidemia   . Essential hypertension   . Steroid-induced hyperglycemia   . Leucocytosis   . Brain mass 11/22/2019  . 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 Abnormal electrocardiogram (ECG) (EKG) 12/31/2017  . White coat syndrome with diagnosis of hypertension 12/31/2017  . Glaucoma 12/26/2013  . Vitamin D deficiency 05/27/2009  . IMPAIRED GLUCOSE TOLERANCE 05/27/2009  . OSTEOPENIA 05/27/2009  . Multiple thyroid nodules 11/06/2007  . Hyperlipidemia 11/06/2007  . Malignant hypertension 11/06/2007    Past Surgical  History:  Procedure Laterality Date  . APPLICATION OF CRANIAL NAVIGATION Left 11/26/2019   Procedure: APPLICATION OF CRANIAL NAVIGATION;  Surgeon: [name], MD;  Location: [place];  Service: Neurosurgery;  Laterality: Left;  APPLICATION OF CRANIAL NAVIGATION  . COLONOSCOPY N/A 06/16/2016   Procedure: COLONOSCOPY;  Surgeon: Danie Binder, MD;  [facility];  Service: Endoscopy;  Laterality: N/A;  9:30 AM  . COLONOSCOPY N/A 10/22/2019   Procedure: COLONOSCOPY;  Surgeon: Daneil Dolin, MD;  Location[facility];  Service: Endoscopy;  Laterality: N/A;  9:30  . CRANIOTOMY Left 11/26/2019   Procedure: LEFT PARIETAL CRANIOTOMY FOR RESECTION OF TUMOR;  Surgeon: [name], MD;  Location: [facility];  Service: Neurosurgery;  Laterality: Left;  LEFT PARIETAL CRANIOTOMY FOR RESECTION OF TUMOR  . EYE SURGERY  2017   3 on left eye 1 on right eye for glaucoma by Dr. Venetia Maxon   . EYE SURGERY  2018   Cataract Surgery by Dr. Arlina Robes   . OPERATIVE ULTRASOUND N/A 11/26/2019   Procedure: OPERATIVE ULTRASOUND;  Surgeon: [name], MD;  Location: [facility];  Service: Neurosurgery;  Laterality: N/A;  OPERATIVE ULTRASOUND     OB History   No obstetric history on file.     Family History  Problem Relation Age of Onset  . Dementia Father   . Dementia Mother   . Diabetes Mother   . Hypertension Mother   . Breast cancer Sister   . Hypertension Sister   . Diabetes Brother     Social History  Tobacco Use  . Smoking status: Never Smoker  . Smokeless tobacco: Never Used  Vaping Use  . Vaping Use: Never used  Substance Use Topics  . Alcohol use: No  . Drug use: No    Home Medications Prior to Admission medications   Medication Sig Start Date End Date Taking? Authorizing Provider  acetaminophen (TYLENOL) 325 MG tablet Take 2 tablets (650 mg total) by mouth every 4 (four) hours as needed for mild pain (temp > 100.5). 12/02/19   [place] Shi, MD  alendronate (FOSAMAX) 70 MG  tablet Take 1 tablet by mouth once a week. 12/14/19   [provider]  amLODipine (NORVASC) 5 MG tablet Take 1 tablet (5 mg total) by mouth daily. 12/09/19   Angiulli, Lavon Paganini, PA-C  Calcium Carbonate-Vit D-Min (CALCIUM 1200) 1200-1000 MG-UNIT CHEW Chew 1,200 mg by mouth daily. 12/09/19   Angiulli, Lavon Paganini, PA-C  cloNIDine (CATAPRES) 0.3 MG tablet TAKE 1 TABLET BY MOUTH EVERYDAY AT BEDTIME 12/09/19   Angiulli, Lavon Paganini, PA-C  Cyanocobalamin 2500 MCG TABS Take 2,500 mcg by mouth daily. 12/09/19   Angiulli, Lavon Paganini, PA-C  dexamethasone (DECADRON) 4 MG tablet Take 1 tablet (4 mg total) by mouth daily. 04/12/20   Ventura Sellers, MD  docusate sodium (COLACE) 100 MG capsule Take 1 capsule (100 mg total) by mouth 2 (two) times daily. 12/02/19   [place] Shi, MD  dorzolamide-timolol (COSOPT) 22.3-6.8 MG/ML ophthalmic solution Place 1 drop into the right eye 2 (two) times daily.  06/06/19 06/05/20  [provider]  Multiple Vitamins-Minerals (CENTRUM SILVER ULTRA WOMENS) TABS Take 1 tablet by mouth daily.     [provider]  ondansetron (ZOFRAN) 8 MG tablet Take 1 tablet 2 (two) times daily as needed for nausea/vomiting. May take 30-60 minutes prior to Temodar administration if nausea/vomiting occurs Patient not taking: Reported on 04/12/2020 12/29/19   Ventura Sellers, MD  ondansetron (ZOFRAN) 8 MG tablet Take 1 tablet (8 mg total) by mouth 2 (two) times daily as needed (nausea and vomiting). May take 30-60 minutes prior to Temodar administration if nausea/vomiting occurs. Patient not taking: Reported on 04/12/2020 03/08/20   Ventura Sellers, MD  pravastatin (PRAVACHOL) 10 MG tablet TAKE 1 TABLET BY MOUTH DAILY *DOSE REDUCTION* 12/09/19   Angiulli, Lavon Paganini, PA-C  temozolomide (TEMODAR) 100 MG capsule Take 100 mg by mouth daily. May take on an empty stomach or at bedtime to decrease nausea & vomiting. Patient not taking: Reported on 02/10/2020 12/29/19   Ventura Sellers, MD   temozolomide (TEMODAR) 140 MG capsule Take 2 capsules (280 mg total) by mouth daily. May take on an empty stomach to decrease nausea & vomiting. Patient not taking: Reported on 04/12/2020 03/08/20   Ventura Sellers, MD  temozolomide (TEMODAR) 180 MG capsule Take 2 capsules (360 mg total) by mouth daily. May take on an empty stomach to decrease nausea & vomiting. 04/12/20   Ventura Sellers, MD  temozolomide (TEMODAR) 20 MG capsule Take 20 mg by mouth daily. May take on an empty stomach or at bedtime to decrease nausea & vomiting. Patient not taking: Reported on 02/10/2020 12/29/19   Ventura Sellers, MD    Allergies    Patient has no known allergies.  Review of Systems   Review of Systems  Unable to perform ROS: Patient unresponsive  Neurological: Positive for seizures.    Physical Exam Updated Vital Signs BP 138/75 (BP Location: Right Arm)   Pulse (!) 119  SpO2 100%   Physical Exam Vitals and nursing note reviewed.   69 year old female, with minimal spontaneous respiratory effort and being ventilated by Ambu bag. Vital signs are significant for elevated heart rate. Oxygen saturation is 100%, which is normal. Head is normocephalic and atraumatic. PERRLA.  Gaze initially was to the right and then became midline.jaws are clenched. Neck is without adenopathy or JVD. Lungs have symmetric airflow. Chest moves symmetrically. Heart has regular rate and rhythm without murmur. Abdomen is soft, flat. Extremities have no cyanosis or edema. Skin is warm and dry without rash. Neurologic: Unresponsive to voice and pain, no spontaneous movement, minimal spontaneous respirations.  ED Results / Procedures / Treatments   Labs (all labs ordered are listed, but only abnormal results are displayed) Labs Reviewed  COMPREHENSIVE METABOLIC PANEL - Abnormal; Notable for the following components:      Result Value   Potassium 3.3 (*)    CO2 21 (*)    Glucose, Bld 219 (*)    BUN 32 (*)     Calcium 8.6 (*)    Anion gap 17 (*)    All other components within normal limits  LACTIC ACID, PLASMA - Abnormal; Notable for the following components:   Lactic Acid, Venous 6.1 (*)    All other components within normal limits  CBC WITH DIFFERENTIAL/PLATELET - Abnormal; Notable for the following components:   RBC 5.14 (*)    Hemoglobin 16.0 (*)    HCT 48.1 (*)    Platelets 55 (*)    nRBC 0.4 (*)    Abs Immature Granulocytes 0.24 (*)    All other components within normal limits  BLOOD GAS, ARTERIAL - Abnormal; Notable for the following components:   pO2, Arterial 438 (*)    All other components within normal limits  RAPID URINE DRUG SCREEN, HOSP PERFORMED - Abnormal; Notable for the following components:   Benzodiazepines POSITIVE (*)    All other components within normal limits  URINALYSIS, ROUTINE W REFLEX MICROSCOPIC - Abnormal; Notable for the following components:   APPearance HAZY (*)    Glucose, UA >=500 (*)    Protein, ur 100 (*)    All other components within normal limits  RESP PANEL BY RT-PCR (FLU A&B, COVID) ARPGX2  LACTIC ACID, PLASMA  TROPONIN I (HIGH SENSITIVITY)  TROPONIN I (HIGH SENSITIVITY)   Radiology CT Head Wo Contrast  Result Date: 05/14/2020 CLINICAL DATA:  Seizure EXAM: CT HEAD WITHOUT CONTRAST TECHNIQUE: Contiguous axial images were obtained from the base of the skull through the vertex without intravenous contrast. COMPARISON:  11/22/2019 FINDINGS: Brain: Postoperative changes of prior brain tumor resection centered at the paramedian left parietal lobe and corpus callosum splenium. No intracranial hemorrhage or extra-axial collection. No acute abnormality. Vascular: No abnormal hyperdensity of the major intracranial arteries or dural venous sinuses. No intracranial atherosclerosis. Skull: Remote left parietal craniotomy. Sinuses/Orbits: No fluid levels or advanced mucosal thickening of the visualized paranasal sinuses. No mastoid or middle ear effusion. The  orbits are normal. IMPRESSION: 1. No acute intracranial abnormality. 2. Postoperative changes of prior brain tumor resection. Electronically Signed   By: Ulyses Jarred M.D.   On: 05/14/2020 03:03   DG Chest Port 1 View  Result Date: 05/14/2020 CLINICAL DATA:  Post intubation EXAM: PORTABLE CHEST 1 VIEW COMPARISON:  11/22/2019 FINDINGS: Endotracheal tube is 2 cm above the carina. NG tube is in the stomach. Heart is normal size. Left basilar opacity, likely atelectasis. Right lung clear. No effusions or pneumothorax.  IMPRESSION: Support devices as above. Left base atelectasis. Electronically Signed   By: Rolm Baptise M.D.   On: 05/14/2020 01:41    Procedures Procedure Name: Intubation Date/Time: 05/14/2020 1:00 AM Performed by: Delora Fuel, MD Pre-anesthesia Checklist: Patient identified, Patient being monitored, Emergency Drugs available, Timeout performed and Suction available Oxygen Delivery Method: Non-rebreather mask Preoxygenation: Pre-oxygenation with 100% oxygen Induction Type: Rapid sequence Ventilation: Mask ventilation without difficulty Laryngoscope Size: Glidescope and 3 Grade View: Grade I Tube size: 7.5 mm Number of attempts: 1 Airway Equipment and Method: Rigid stylet and Video-laryngoscopy Placement Confirmation: ETT inserted through vocal cords under direct vision,  CO2 detector and Breath sounds checked- equal and bilateral Secured at: 23 cm Tube secured with: ETT holder Dental Injury: Teeth and Oropharynx as per pre-operative assessment        CRITICAL CARE Performed by: Delora Fuel Total critical care time: 90 minutes Critical care time was exclusive of separately billable procedures and treating other patients. Critical care was necessary to treat or prevent imminent or life-threatening deterioration. Critical care was time spent personally by me on the following activities: development of treatment plan with patient and/or surrogate as well as nursing,  discussions with consultants, evaluation of patient's response to treatment, examination of patient, obtaining history from patient or surrogate, ordering and performing treatments and interventions, ordering and review of laboratory studies, ordering and review of radiographic studies, pulse oximetry and re-evaluation of patient's condition.  Medications Ordered in ED Medications  propofol (DIPRIVAN) 1000 MG/100ML infusion (65 mcg/kg/min  63.5 kg Intravenous Rate/Dose Change 05/14/20 0300)  levETIRAcetam (KEPPRA) IVPB 1000 mg/100 mL premix (0 mg Intravenous Stopped 05/14/20 0121)  dexamethasone (DECADRON) injection 10 mg (10 mg Intravenous Given 05/14/20 0118)    ED Course  I have reviewed the triage vital signs and the nursing notes.  Pertinent labs & imaging results that were available during my care of the patient were reviewed by me and considered in my medical decision making (see chart for details).  MDM Rules/Calculators/A&P Respiratory arrest following seizure.  Old records reviewed showing patient had recurrence of glioblastoma and is currently on chemotherapy for same.  It is very likely that she had hemorrhage into the tumor.  She is promptly intubated and will be sent for CT of head.  She is also given levetiracetam.  CT of head shows postoperative changes but no acute changes.  Labs are only significant for elevated lactic acid level which is expected following a seizure, and mild hypokalemia.  Case is discussed with Dr. Josephine Cables of [facility], who agrees to admit the patient.  Final Clinical Impression(s) / ED Diagnoses Final diagnoses:  Seizure ([place])  Respiratory arrest ([place])  Glioblastoma ([place])  Hypokalemia    Rx / DC Orders ED Discharge Orders    None       Delora Fuel, MD 11/91/47 [phone]

## 2020-05-14 NOTE — Consult Note (Addendum)
[facility] Nurse Consult Note: Reason for Consult:Consult requested for sacrum wounds. Consult performed remotely after review of progress notes, nursing flow sheet, and remote ICU camera to assess wound appearance with the assistance from the bedside nurse. Pt is critically ill and has multiple systemic factors which can impair healing.  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 Locations red and slightly moist, affected area approx 3.5X1.5X.2cm according to nursing flow sheet.  Pressure Injury POA: Yes Dressing procedure/placement/frequency: Pt is on a low airloss mattress to reduce pressure. Topical treatment orders provided for bedside nurses to perform as follows: Apply moist gauze 2X2cm packing to sacrum wounds Q day, then cover with foam dressing.  (Change foam dressing Q 3 days or PRN soiling.) Please re-consult if further assistance is needed.  Thank-you,  Julien Girt MSN, [place], [place], [place], [place]

## 2020-05-14 NOTE — ED Triage Notes (Signed)
Family called EMS for seizure. Pt postictal for EMS on arrival. Pt then had seizure while being loaded into truck. Pt given 5mg  versed enroute. Pt then resp arrested, pt being bagged on arrival. EDP to bedside.

## 2020-05-14 NOTE — H&P (Signed)
History and Physical  Wanda Ross WGN:[id number] DOB: Feb 10, 1951 DOA: 05/14/2020  Referring physician: Delora Fuel, MD PCP: Fayrene Helper, MD  Patient coming from: Home  Chief Complaint: Seizures  HPI: Wanda Ross is a 69 y.o. female with medical history significant for hyperlipidemia, hypertension, glioblastoma s/p craniotomy and resection with Dr. Marcello Moores (11/26/2019), radiation and chemotherapy (follows with Dr. Mickeal Skinner) who presents to the emergency department via EMS due to seizures sustained at home.  History cannot be obtained from patient due to being intubated and sedated.  History was obtained from daughter at bedside.  Per daughter, she had lengthy conversation with patient within 1 hour to onset of seizures at home, EMS was activated and on arrival of EMS team, patient was noted to have generalized tonic activity with no clonic activity.  Midazolam was given in route to the ED and this resulted in apnea in patient, bag-valve-mask respiration was provided till arrival of patient to the ED.  ED Course:  In the emergency department, vital signs were within normal range and O2 sat was 100% on bag-valve-mask.  She was intubated and sedated with propofol drip.  Work-up in the ED showed H/H 16/48.1, thrombocytopenia, hypokalemia, hyperglycemia, lactic acid 6.1, urinalysis was unimpressive for UTI, urine drug screen was positive for benzodiazepine (patient was given midazolam in route to the ED).  Respiratory panel for influenza A, B and SARS coronavirus 2 was negative.  CT of head without contrast showed no acute intracranial abnormality.  Chest x-ray showed left base atelectasis.,  IV Keppra and IV Ativan 2 mg x 1 was given.  Hospitalist was asked to admit patient for further evaluation and management.  Review of Systems: This cannot be obtained at this time due to patient being intubated and sedated.   Past Medical History:  Diagnosis Date  . Glaucoma   . Hyperlipidemia  2010  . Hypertension 1990  . IGT (impaired glucose tolerance) 05/11/2010  . Multinodular goiter 2010   normal function   Past Surgical History:  Procedure Laterality Date  . APPLICATION OF CRANIAL NAVIGATION Left 11/26/2019   Procedure: APPLICATION OF CRANIAL NAVIGATION;  Surgeon: [name], MD;  Location: [place];  Service: Neurosurgery;  Laterality: Left;  APPLICATION OF CRANIAL NAVIGATION  . COLONOSCOPY N/A 06/16/2016   Procedure: COLONOSCOPY;  Surgeon: Danie Binder, MD;  [facility];  Service: Endoscopy;  Laterality: N/A;  9:30 AM  . COLONOSCOPY N/A 10/22/2019   Procedure: COLONOSCOPY;  Surgeon: Daneil Dolin, MD;  [facility];  Service: Endoscopy;  Laterality: N/A;  9:30  . CRANIOTOMY Left 11/26/2019   Procedure: LEFT PARIETAL CRANIOTOMY FOR RESECTION OF TUMOR;  Surgeon: [name], MD;  Location: [place];  Service: Neurosurgery;  Laterality: Left;  LEFT PARIETAL CRANIOTOMY FOR RESECTION OF TUMOR  . EYE SURGERY  2017   3 on left eye 1 on right eye for glaucoma by Dr. Venetia Maxon   . EYE SURGERY  2018   Cataract Surgery by Dr. Arlina Robes   . OPERATIVE ULTRASOUND N/A 11/26/2019   Procedure: OPERATIVE ULTRASOUND;  Surgeon: [name], MD;  Location: [place];  Service: Neurosurgery;  Laterality: N/A;  OPERATIVE ULTRASOUND    Social History:  reports that she has never smoked. She has never used smokeless tobacco. She reports that she does not drink alcohol and does not use drugs.   No Known Allergies  Family History  Problem Relation Age of Onset  . Dementia Father   .  Dementia Mother   . Diabetes Mother   . Hypertension Mother   . Breast cancer Sister   . Hypertension Sister   . Diabetes Brother     Prior to Admission medications   Medication Sig Start Date End Date Taking? Authorizing Provider  acetaminophen (TYLENOL) 325 MG tablet Take 2 tablets (650 mg total) by mouth every 4 (four) hours as needed for mild pain (temp > 100.5). 12/02/19    Guilford Shi, MD  alendronate (FOSAMAX) 70 MG tablet Take 1 tablet by mouth once a week. 12/14/19   [provider]  amLODipine (NORVASC) 5 MG tablet Take 1 tablet (5 mg total) by mouth daily. 12/09/19   Angiulli, Lavon Paganini, PA-C  Calcium Carbonate-Vit D-Min (CALCIUM 1200) 1200-1000 MG-UNIT CHEW Chew 1,200 mg by mouth daily. 12/09/19   Angiulli, Lavon Paganini, PA-C  cloNIDine (CATAPRES) 0.3 MG tablet TAKE 1 TABLET BY MOUTH EVERYDAY AT BEDTIME 12/09/19   Angiulli, Lavon Paganini, PA-C  Cyanocobalamin 2500 MCG TABS Take 2,500 mcg by mouth daily. 12/09/19   Angiulli, Lavon Paganini, PA-C  dexamethasone (DECADRON) 4 MG tablet Take 1 tablet (4 mg total) by mouth daily. 04/12/20   Ventura Sellers, MD  docusate sodium (COLACE) 100 MG capsule Take 1 capsule (100 mg total) by mouth 2 (two) times daily. 12/02/19   Guilford Shi, MD  dorzolamide-timolol (COSOPT) 22.3-6.8 MG/ML ophthalmic solution Place 1 drop into the right eye 2 (two) times daily.  06/06/19 06/05/20  [provider]  Multiple Vitamins-Minerals (CENTRUM SILVER ULTRA WOMENS) TABS Take 1 tablet by mouth daily.     [provider]  ondansetron (ZOFRAN) 8 MG tablet Take 1 tablet 2 (two) times daily as needed for nausea/vomiting. May take 30-60 minutes prior to Temodar administration if nausea/vomiting occurs Patient not taking: Reported on 04/12/2020 12/29/19   Ventura Sellers, MD  ondansetron (ZOFRAN) 8 MG tablet Take 1 tablet (8 mg total) by mouth 2 (two) times daily as needed (nausea and vomiting). May take 30-60 minutes prior to Temodar administration if nausea/vomiting occurs. Patient not taking: Reported on 04/12/2020 03/08/20   Ventura Sellers, MD  pravastatin (PRAVACHOL) 10 MG tablet TAKE 1 TABLET BY MOUTH DAILY *DOSE REDUCTION* 12/09/19   Angiulli, Lavon Paganini, PA-C  temozolomide (TEMODAR) 100 MG capsule Take 100 mg by mouth daily. May take on an empty stomach or at bedtime to decrease nausea & vomiting. Patient not taking: Reported  on 02/10/2020 12/29/19   Ventura Sellers, MD  temozolomide (TEMODAR) 140 MG capsule Take 2 capsules (280 mg total) by mouth daily. May take on an empty stomach to decrease nausea & vomiting. Patient not taking: Reported on 04/12/2020 03/08/20   Ventura Sellers, MD  temozolomide (TEMODAR) 180 MG capsule Take 2 capsules (360 mg total) by mouth daily. May take on an empty stomach to decrease nausea & vomiting. 04/12/20   Ventura Sellers, MD  temozolomide (TEMODAR) 20 MG capsule Take 20 mg by mouth daily. May take on an empty stomach or at bedtime to decrease nausea & vomiting. Patient not taking: Reported on 02/10/2020 12/29/19   Ventura Sellers, MD    Physical Exam: BP (!) 162/106   Pulse 98   Temp (!) 97.5 F (36.4 C)   Resp 18   Ht 5\' 6"  (1.676 m)   Wt 63.5 kg   SpO2 100%   BMI 22.60 kg/m   . General: 69 y.o. year-old female well developed well nourished in no acute distress.  Alert and  oriented x3. . Cardiovascular: Regular rate and rhythm with no rubs or gallops.  No thyromegaly or JVD noted.  No lower extremity edema. 2/4 pulses in all 4 extremities. Marland Kitchen Respiratory: Clear to auscultation with no wheezes or rales. Good inspiratory effort. . Abdomen: Soft nontender nondistended with normal bowel sounds x4 quadrants. . Muskuloskeletal: No cyanosis, clubbing or edema noted bilaterally . Neuro: CN II-XII intact, strength, sensation, reflexes . Skin: No ulcerative lesions noted or rashes . Psychiatry: Judgement and insight appear normal. Mood is appropriate for condition and setting          Labs on Admission:  Basic Metabolic Panel: Recent Labs  Lab 05/14/20 0059 05/14/20 0346  NA 140  --   K 3.3*  --   CL 102  --   CO2 21*  --   GLUCOSE 219*  --   BUN 32*  --   CREATININE 0.95  --   CALCIUM 8.6*  --   MG  --  2.3   Liver Function Tests: Recent Labs  Lab 05/14/20 0059  AST 27  ALT 40  ALKPHOS 64  BILITOT 0.6  PROT 6.6  ALBUMIN 3.8   No results for input(s):  LIPASE, AMYLASE in the last 168 hours. No results for input(s): AMMONIA in the last 168 hours. CBC: Recent Labs  Lab 05/14/20 0059  WBC 4.8  NEUTROABS 2.5  HGB 16.0*  HCT 48.1*  MCV 93.6  PLT 55*   Cardiac Enzymes: Recent Labs  Lab 05/14/20 0346  CKTOTAL 46    BNP (last 3 results) No results for input(s): BNP in the last 8760 hours.  ProBNP (last 3 results) No results for input(s): PROBNP in the last 8760 hours.  CBG: No results for input(s): GLUCAP in the last 168 hours.  Radiological Exams on Admission: CT Head Wo Contrast  Result Date: 05/14/2020 CLINICAL DATA:  Seizure EXAM: CT HEAD WITHOUT CONTRAST TECHNIQUE: Contiguous axial images were obtained from the base of the skull through the vertex without intravenous contrast. COMPARISON:  11/22/2019 FINDINGS: Brain: Postoperative changes of prior brain tumor resection centered at the paramedian left parietal lobe and corpus callosum splenium. No intracranial hemorrhage or extra-axial collection. No acute abnormality. Vascular: No abnormal hyperdensity of the major intracranial arteries or dural venous sinuses. No intracranial atherosclerosis. Skull: Remote left parietal craniotomy. Sinuses/Orbits: No fluid levels or advanced mucosal thickening of the visualized paranasal sinuses. No mastoid or middle ear effusion. The orbits are normal. IMPRESSION: 1. No acute intracranial abnormality. 2. Postoperative changes of prior brain tumor resection. Electronically Signed   By: Ulyses Jarred M.D.   On: 05/14/2020 03:03   DG Chest Port 1 View  Result Date: 05/14/2020 CLINICAL DATA:  Post intubation EXAM: PORTABLE CHEST 1 VIEW COMPARISON:  11/22/2019 FINDINGS: Endotracheal tube is 2 cm above the carina. NG tube is in the stomach. Heart is normal size. Left basilar opacity, likely atelectasis. Right lung clear. No effusions or pneumothorax. IMPRESSION: Support devices as above. Left base atelectasis. Electronically Signed   By: Rolm Baptise  M.D.   On: 05/14/2020 01:41    EKG: I independently viewed the EKG done and my findings are as followed: EKG was not done in the ED.  Assessment/Plan Present on Admission: . Acute respiratory failure with hypoxia ([place]) . Glioblastoma ([place]) . Hyperlipidemia . Benign essential HTN  Principal Problem:   Acute respiratory failure with hypoxia (HCC) Active Problems:   Hyperlipidemia   Benign essential HTN   Glioblastoma (HCC)   Seizures (  HCC)   Thrombocytopenia (HCC)   Lactic acidosis   Hypokalemia   Hyperglycemia  Acute respiratory failure with hypoxia requiring IPPV s/p administration of midazolam in the setting of acute seizures  Patient became apneic after administration of midazolam in route to the ED, on arrival to the ED, patient was intubated and sedated. IV Keppra 1 g x 1 was given in the ED (patient has not had prior seizures and was not on any seizure medications at baseline per daughter at bedside). Patient was started on IV propofol drip for sedation ABG showed pH 7.369, PCO2 44.7, PO2 438, bicarb 24.5 EEG will be done in the morning Neurology will be consulted for further evaluation and management.  Lactic acidosis possibly due to seizures Lactic acid 6.1 > 2.8, continue to trend lactic acid  Hypokalemia K+ 3.3, this will be replenished  Hyperglycemia possibly due to steroid use Blood glucose 219, continue insulin sliding scale and hypoglycemic protocol Hemoglobin A1c will be checked  Thrombocytopenia possibly chemotherapeutic induced Platelets 55, continue to monitor platelet level  Hypertension Hold BP meds at this time due to soft BP  Hyperlipidemia Continue home statin when patient is able to resume oral intake   DVT prophylaxis: SCDs  Code Status: Full code  Family Communication: Daughter at bedside (all questions answered to satisfaction)  Disposition Plan:  Patient is from:                        home Anticipated DC to:                   SNF  or family members home Anticipated DC date:               2-3 days Anticipated DC barriers:           Patient interbowel stable to be discharged at this time due to acute respiratory failure with hypoxia requiring IPPV in the setting of seizures and pending neurology consult  Consults called: Neurology  Admission status: Inpatient    Bernadette Hoit MD [facility]  05/14/2020, 5:30 AM

## 2020-05-14 NOTE — ED Notes (Addendum)
[facility] arrived. Pt started on fentanyl per their request

## 2020-05-14 NOTE — Plan of Care (Signed)
  Problem: Clinical Measurements: Goal: Ability to maintain clinical measurements within normal limits will improve Outcome: Progressing Goal: Diagnostic test results will improve Outcome: Progressing Goal: Respiratory complications will improve Outcome: Progressing   Problem: Nutrition: Goal: Adequate nutrition will be maintained Outcome: Progressing   

## 2020-05-14 NOTE — H&P (Signed)
NAME:  Wanda Ross, MRN:  [id number], DOB:  1950-12-14, LOS: 0 ADMISSION DATE:  05/14/2020, CONSULTATION DATE:  12/17 REFERRING MD:  Josephine Cables, CHIEF COMPLAINT:  Seizure   Brief History:  69 y/o female admitted from [place] on 12/17 after she had a seizure at home.  Required intubation in the [facility] ED and was then admitted to [facility].  She has a medical history notable for glioblastoma.  History of Present Illness:  69 y/o female admitted from [facility] on 12/17 after she had a seizure at home.  Required intubation in the [facility] ED and was then admitted to [facility].  She has a medical history notable for glioblastoma. She had 3 seizures (2 at home, 1 in ambulance), received midazolam on the way to the hospital.  Not protecting her airway in the [facility] ER so she was intubated. She was unable to provide a history here because of intubation.   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 Marcello Moores 10/2019, GBM on path; treated with radiation therapy and Temozolomide; currently being treated with Temodar Hypertension Impaired glucose tolerance Hyperlipidemia Multinodular goiter Glaucoma  Significant Hospital Events:  12/17 admission   Consults:  Neurology  Procedures:  12/17 ETT >   Significant Diagnostic Tests:  12/17 CT head > NAICP, post operative changes of prior brain tumor resection  Micro Data:  12/17 SARS COV 2 / FLU > negative  Antimicrobials:    Interim History / Subjective:    Objective   Blood pressure (!) 144/82, pulse 79, temperature 98.4 F (36.9 C), temperature source Oral, resp. rate 18, height 5\' 6"  (1.676 m), weight 70.6 kg, SpO2 100 %.    Vent Mode: PRVC FiO2 (%):  [30 %-100 %] 30 % Set Rate:  [18 bmp] 18 bmp Vt Set:  [470 mL] 470 mL PEEP:  [5 cmH20] 5 cmH20 Plateau Pressure:  [10 TDV76-16 cmH20] 12 cmH20   Intake/Output Summary (Last 24 hours) at 05/14/2020 0737 Last data filed at 05/14/2020  1062 Gross per 24 hour  Intake 359.01 ml  Output --  Net 359.01 ml   Filed Weights   05/14/20 0057 05/14/20 0704  Weight: 63.5 kg 70.6 kg    Examination:  General:  In bed on vent HENT: NCAT ETT in place PULM: CTA B, vent supported breathing CV: RRR, no mgr GI: BS+, soft, nontender MSK: normal bulk and tone Neuro: sedated on vent    Resolved Hospital Problem list     Assessment & Plan:  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 SCD for DVT prophylaxis for now  Best practice (evaluated daily)  Diet: tube feeding  Pain/Anxiety/Delirium protocol (if indicated): as above VAP protocol (if indicated): yes DVT prophylaxis: SCD GI prophylaxis: Famotidine Glucose control: as above Mobility: bed rest Disposition: remain in ICU  Goals of Care:  Last date of multidisciplinary goals of care discussion: plan on 12/17 after MRI brain Family and staff present:  Summary of discussion:  Follow up goals of care discussion due:  Code Status: Full  Labs   CBC: Recent Labs  Lab 05/14/20 0059  WBC 4.8  NEUTROABS 2.5  HGB 16.0*  HCT 48.1*  MCV 93.6  PLT 55*    Basic Metabolic Panel: Recent Labs  Lab 05/14/20 0059 05/14/20 0346  NA 140  --   K 3.3*  --   CL 102  --   CO2 21*  --   GLUCOSE 219*  --   BUN 32*  --   CREATININE 0.95  --   CALCIUM 8.6*  --   MG  --  2.3   GFR: Estimated Creatinine Clearance: 50.8 mL/min (by C-G formula based on SCr of 0.95  mg/dL). Recent Labs  Lab 05/14/20 0059 05/14/20 0115 05/14/20 0346  WBC 4.8  --   --   LATICACIDVEN  --  6.1* 2.8*    Liver Function Tests: Recent Labs  Lab 05/14/20 0059  AST 27  ALT 40  ALKPHOS 64  BILITOT 0.6  PROT 6.6  ALBUMIN 3.8   No results for input(s): LIPASE, AMYLASE in the last 168 hours. No results for input(s): AMMONIA in the last 168 hours.  ABG    Component Value Date/Time   PHART 7.369 05/14/2020 0125   PCO2ART 44.7 05/14/2020 0125   PO2ART 438 (H) 05/14/2020 0125   HCO3 24.5 05/14/2020 0125   O2SAT 99.3 05/14/2020 0125     Coagulation Profile: No results for input(s): INR, PROTIME in the last 168 hours.  Cardiac Enzymes: Recent Labs  Lab 05/14/20 0346  CKTOTAL 46    HbA1C: Hgb A1c MFr Bld  Date/Time Value Ref Range Status  12/02/2019 08:42 AM 6.1 (H) 4.8 - 5.6 % Final    Comment:    (NOTE) Pre diabetes:          5.7%-6.4%  Diabetes:              >6.4%  Glycemic control for   <7.0% adults with diabetes   05/06/2015 08:34 AM 5.4 <5.7 % Final    Comment:                                                                           According to the ADA Clinical Practice Recommendations for 2011, when HbA1c is used as a screening test:     >=6.5%   Diagnostic of Diabetes Mellitus            (if abnormal result is confirmed)   5.7-6.4%   Increased risk of developing Diabetes Mellitus   References:Diagnosis and Classification of Diabetes Mellitus,Diabetes BJSE,8315,17(OHYWV 1):S62-S69 and Standards of Medical Care in         Diabetes - 2011,Diabetes Care,2011,34 (Suppl 1):S11-S61.       CBG: Recent Labs  Lab 05/14/20 0823  GLUCAP 171*    Review of Systems:   Cannot obtain due to intubation  Past Medical History:  She,  has a past medical history of Glaucoma, Hyperlipidemia (2010), Hypertension (1990), IGT (impaired glucose tolerance) (05/11/2010), and Multinodular goiter (2010).   Surgical History:   Past Surgical  History:  Procedure Laterality Date  . APPLICATION OF CRANIAL NAVIGATION Left 11/26/2019   Procedure: APPLICATION OF CRANIAL NAVIGATION;  Surgeon: [name], MD;  Location: [place] [facility];  Service: Neurosurgery;  Laterality: Left;  APPLICATION OF CRANIAL NAVIGATION  . COLONOSCOPY N/A 06/16/2016   Procedure: COLONOSCOPY;  Surgeon: Danie Binder, MD;  [facility];  Service: Endoscopy;  Laterality: N/A;  9:30 AM  . COLONOSCOPY N/A 10/22/2019   Procedure: COLONOSCOPY;  Surgeon: Daneil Dolin, MD;  [facility];  Service: Endoscopy;  Laterality: N/A;  9:30  . CRANIOTOMY Left 11/26/2019   Procedure: LEFT PARIETAL CRANIOTOMY FOR RESECTION OF TUMOR;  Surgeon: [name], MD;  Location: [place];  Service: Neurosurgery;  Laterality: Left;  LEFT PARIETAL CRANIOTOMY FOR RESECTION OF TUMOR  . EYE SURGERY  2017   3 on left eye 1 on right eye for glaucoma by Dr. Venetia Maxon   . EYE SURGERY  2018   Cataract Surgery by Dr. Arlina Robes   . OPERATIVE ULTRASOUND N/A 11/26/2019   Procedure: OPERATIVE ULTRASOUND;  Surgeon: [name], MD;  Location: [place];  Service: Neurosurgery;  Laterality: N/A;  OPERATIVE ULTRASOUND     Social History:   reports that she has never smoked. She has never used smokeless tobacco. She reports that she does not drink alcohol and does not use drugs.   Family History:  Her family history includes Breast cancer in her sister; Dementia in her father and mother; Diabetes in her brother and mother; Hypertension in her mother and sister.   Allergies No Known Allergies   Home Medications  Prior to Admission medications   Medication Sig Start Date End Date Taking? Authorizing Provider  acetaminophen (TYLENOL) 325 MG tablet Take 2 tablets (650 mg total) by mouth every 4 (four) hours as needed for mild pain (temp > 100.5). 12/02/19  Yes Guilford Shi, MD  alendronate (FOSAMAX) 70 MG tablet Take 1 tablet by mouth every Sunday. 12/14/19  Yes [provider]  amLODipine (NORVASC) 5 MG tablet Take 1 tablet (5 mg total) by mouth daily. 12/09/19  Yes Angiulli, Lavon Paganini, PA-C  Calcium Carbonate-Vit D-Min (CALCIUM 1200) 1200-1000 MG-UNIT CHEW Chew 1,200 mg by mouth daily. 12/09/19  Yes Angiulli, Lavon Paganini, PA-C  cloNIDine (CATAPRES) 0.3 MG tablet TAKE 1 TABLET BY MOUTH EVERYDAY AT BEDTIME Patient taking differently: Take 0.3 mg by mouth at bedtime. 12/09/19  Yes Angiulli, Lavon Paganini, PA-C  Cyanocobalamin 2500 MCG TABS Take 2,500 mcg by mouth daily. Patient taking differently: Take 2,500 mcg by mouth at bedtime. 12/09/19  Yes Angiulli, Lavon Paganini, PA-C  dexamethasone (DECADRON) 4 MG tablet Take 1 tablet (4 mg total) by mouth daily. 04/12/20  Yes Vaslow, Acey Lav, MD  docusate sodium (COLACE) 100 MG capsule Take 1 capsule (100 mg total) by mouth 2 (two) times daily. Patient taking differently: Take 100 mg by mouth 2 (two) times daily as needed for mild constipation. 12/02/19  Yes Guilford Shi, MD  dorzolamide-timolol (COSOPT) 22.3-6.8 MG/ML ophthalmic solution Place 1 drop into the right eye 2 (two) times daily.  06/06/19 06/05/20 Yes [provider]  Multiple Vitamins-Minerals (CENTRUM SILVER ULTRA WOMENS) TABS Take 1 tablet by mouth daily.   Yes [provider]  ondansetron (ZOFRAN) 8 MG tablet Take 1 tablet (8 mg total) by mouth 2 (two) times daily as needed (nausea and vomiting). May take 30-60 minutes prior to Temodar administration if nausea/vomiting occurs. Patient taking differently: Take 8 mg by mouth 2 (two) times daily as needed for vomiting or nausea (nausea and vomiting). May take 30-60 minutes prior to Temodar administration if nausea/vomiting occurs. 03/08/20  Yes  Ventura Sellers, MD  pravastatin (PRAVACHOL) 10 MG tablet TAKE 1 TABLET BY MOUTH DAILY *DOSE REDUCTION* Patient taking differently: Take 10 mg by mouth at bedtime. 12/09/19  Yes Angiulli, Lavon Paganini, PA-C  temozolomide (TEMODAR) 180 MG capsule Take 2 capsules (360 mg total)  by mouth daily. May take on an empty stomach to decrease nausea & vomiting. Patient not taking: Reported on 05/14/2020 04/12/20   Ventura Sellers, MD     Critical care time: 40 minutes    Roselie Awkward, MD [place] [facility] Pager: [phone] Cell: [phone] If no response, call [phone]

## 2020-05-15 ENCOUNTER — Inpatient Hospital Stay (HOSPITAL_COMMUNITY): Payer: Medicare PPO

## 2020-05-15 DIAGNOSIS — Z515 Encounter for palliative care: Secondary | ICD-10-CM

## 2020-05-15 DIAGNOSIS — Z7189 Other specified counseling: Secondary | ICD-10-CM

## 2020-05-15 DIAGNOSIS — R569 Unspecified convulsions: Secondary | ICD-10-CM

## 2020-05-15 LAB — CBC WITH DIFFERENTIAL/PLATELET
Abs Immature Granulocytes: 0.19 10*3/uL — ABNORMAL HIGH (ref 0.00–0.07)
Basophils Absolute: 0 10*3/uL (ref 0.0–0.1)
Basophils Relative: 0 %
Eosinophils Absolute: 0 10*3/uL (ref 0.0–0.5)
Eosinophils Relative: 0 %
HCT: 40.1 % (ref 36.0–46.0)
Hemoglobin: 13.7 g/dL (ref 12.0–15.0)
Immature Granulocytes: 6 %
Lymphocytes Relative: 10 %
Lymphs Abs: 0.3 10*3/uL — ABNORMAL LOW (ref 0.7–4.0)
MCH: 30.6 pg (ref 26.0–34.0)
MCHC: 34.2 g/dL (ref 30.0–36.0)
MCV: 89.5 fL (ref 80.0–100.0)
Monocytes Absolute: 0.2 10*3/uL (ref 0.1–1.0)
Monocytes Relative: 7 %
Neutro Abs: 2.3 10*3/uL (ref 1.7–7.7)
Neutrophils Relative %: 77 %
Platelets: 51 10*3/uL — ABNORMAL LOW (ref 150–400)
RBC: 4.48 MIL/uL (ref 3.87–5.11)
RDW: 15.1 % (ref 11.5–15.5)
WBC: 3 10*3/uL — ABNORMAL LOW (ref 4.0–10.5)
nRBC: 0 % (ref 0.0–0.2)

## 2020-05-15 LAB — PROTIME-INR
INR: 1 (ref 0.8–1.2)
Prothrombin Time: 13.1 seconds (ref 11.4–15.2)

## 2020-05-15 LAB — COMPREHENSIVE METABOLIC PANEL
ALT: 29 U/L (ref 0–44)
AST: 16 U/L (ref 15–41)
Albumin: 3 g/dL — ABNORMAL LOW (ref 3.5–5.0)
Alkaline Phosphatase: 40 U/L (ref 38–126)
Anion gap: 11 (ref 5–15)
BUN: 33 mg/dL — ABNORMAL HIGH (ref 8–23)
CO2: 23 mmol/L (ref 22–32)
Calcium: 8.4 mg/dL — ABNORMAL LOW (ref 8.9–10.3)
Chloride: 106 mmol/L (ref 98–111)
Creatinine, Ser: 0.7 mg/dL (ref 0.44–1.00)
GFR, Estimated: >60 mL/min (ref >60)
Glucose, Bld: 193 mg/dL — ABNORMAL HIGH (ref 70–99)
Potassium: 4 mmol/L (ref 3.5–5.1)
Sodium: 140 mmol/L (ref 135–145)
Total Bilirubin: 0.7 mg/dL (ref 0.3–1.2)
Total Protein: 5.2 g/dL — ABNORMAL LOW (ref 6.5–8.1)

## 2020-05-15 LAB — GLUCOSE, CAPILLARY
Glucose-Capillary: 136 mg/dL — ABNORMAL HIGH (ref 70–99)
Glucose-Capillary: 139 mg/dL — ABNORMAL HIGH (ref 70–99)
Glucose-Capillary: 149 mg/dL — ABNORMAL HIGH (ref 70–99)
Glucose-Capillary: 160 mg/dL — ABNORMAL HIGH (ref 70–99)

## 2020-05-15 LAB — PHOSPHORUS
Phosphorus: 3.7 mg/dL (ref 2.5–4.6)
Phosphorus: 3.2 mg/dL (ref 2.5–4.6)

## 2020-05-15 LAB — MAGNESIUM
Magnesium: 2.3 mg/dL (ref 1.7–2.4)
Magnesium: 2.4 mg/dL (ref 1.7–2.4)

## 2020-05-15 LAB — TRIGLYCERIDES: Triglycerides: 81 mg/dL (ref <150)

## 2020-05-15 LAB — APTT: aPTT: 22 seconds — ABNORMAL LOW (ref 24–36)

## 2020-05-15 NOTE — Progress Notes (Addendum)
NAME:  Wanda Ross, MRN:  [id number], DOB:  December 17, 1950, LOS: 1 ADMISSION DATE:  05/14/2020, CONSULTATION DATE:  12/17 REFERRING MD:  Josephine Cables, CHIEF COMPLAINT:  Seizure   Brief History:  69 y/o female admitted from [place] on 12/17 after she had a seizure at home.  Required intubation in the [facility] ED and was then admitted to [facility].  She has a medical history notable for glioblastoma.  History of Present Illness:  69 y/o female admitted from [facility] on 12/17 after she had a seizure at home.  Required intubation in the [facility] ED and was then admitted to [facility]  She has a medical history notable for glioblastoma. She had 3 seizures (2 at home, 1 in ambulance), received midazolam on the way to the hospital.  Not protecting her airway in the [facility] ER so she was intubated. She was unable to provide a history here because of intubation.   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 chlorhexidine gluconate (MEDLINE KIT)  15 mL Mouth Rinse BID  . Chlorhexidine Gluconate Cloth  6 each Topical Daily  . dexamethasone (DECADRON) injection  4 mg Intravenous Q6H  . docusate  100 mg Per Tube BID  . feeding supplement (PROSource TF)  45 mL Per Tube 5 X Daily  . feeding supplement (VITAL HIGH PROTEIN)  1,000 mL Per Tube Q24H  . free water  100 mL Per Tube Q4H  . insulin aspart  0-15 Units Subcutaneous Q4H  . mouth rinse  15 mL  Mouth Rinse 10 times per day  . multivitamin with minerals  1 tablet Per Tube Daily  . polyethylene glycol  17 g Per Tube Daily   Continuous Infusions: . famotidine (PEPCID) IV Stopped (05/14/20 2307)  . fentaNYL infusion INTRAVENOUS 25 mcg/hr (05/15/20 0406)  . levETIRAcetam Stopped (05/14/20 2107)  . propofol (DIPRIVAN) infusion 35 mcg/kg/min (05/15/20 1000)   PRN Meds:.fentaNYL (SUBLIMAZE) injection, fentaNYL (SUBLIMAZE) injection, midazolam, midazolam   Interim History / Subjective:  Sedated on vent   Objective   Blood pressure 102/60, pulse 66, temperature 97.6 F (36.4 C), temperature source Oral, resp. rate 18, height _0  (1.676 m), weight 70.6 kg, SpO2 100 %.    Vent Mode: PRVC FiO2 (%):  [30 %] 30 % Set Rate:  [18 bmp] 18 bmp Vt Set:  [470 mL] 470 mL PEEP:  [5 cmH20] 5 cmH20 Plateau Pressure:  [14 cmH20-15 cmH20] 15 cmH20   Intake/Output Summary (Last 24 hours) at 05/15/2020 1115 Last data filed at 05/15/2020 0500 Gross per 24 hour  Intake 1217.82 ml  Output 2450 ml  Net -1232.18 ml   Filed Weights   05/14/20 0057 05/14/20 0704  Weight: 63.5 kg 70.6 kg    Examination:  Tmax  99.2 Pt sedated on vent, not breathing over back up rate  No jvd Oropharynx et Neck supple Lungs with min  scattered exp > insp rhonchi bilaterally RRR no s3 or or  sign murmur Abd soft/ nl  excursion  Extr warm with no edema or clubbing noted    I personally reviewed images and agree with radiology impression as follows:  CXR:   12/18 portable 1. Left basilar atelectasis. 2. Stable support devices. 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 Lab Results  Component Value Date   PLT 51 (L) 05/15/2020   PLT 55 (L) 05/14/2020   PLT 307 04/12/2020   PLT 170 02/05/2020   PLT 329 01/22/2020    SCD for DVT prophylaxis for now  Best practice (evaluated daily)  Diet: tube feeding  Pain/Anxiety/Delirium protocol (if indicated): as above VAP protocol (if indicated): yes DVT prophylaxis: SCD GI prophylaxis: Famotidine Glucose control: as above Mobility: bed rest Disposition: remain in ICU  Goals of Care:  Last date of multidisciplinary goals of care discussion: plan on 12/17 after MRI brain Family and staff present:  Summary of discussion:  Follow up goals of care discussion due:  Code Status: Full  Labs   CBC: Recent Labs  Lab 05/14/20 0059 05/15/20 0224  WBC 4.8 3.0*  NEUTROABS 2.5 2.3  HGB 16.0* 13.7  HCT 48.1* 40.1  MCV 93.6 89.5  PLT 55* 51*    Basic Metabolic Panel: Recent Labs  Lab 05/14/20 0059 05/14/20 0346 05/14/20 1045 05/14/20 1713 05/15/20 0224  NA 140  --   --   --  140  K 3.3*  --   --   --  4.0  CL 102  --   --   --  106  CO2 21*  --   --   --  23  GLUCOSE 219*  --   --   --  193*  BUN 32*  --   --   --  33*  CREATININE 0.95  --   --   --  0.70  CALCIUM 8.6*  --   --   --  8.4*  MG  --  2.3 2.3 2.2 2.3  PHOS  --   --  3.7 3.7 3.7   GFR: Estimated Creatinine Clearance: 60.4 mL/min (by C-G formula based on SCr of 0.7 mg/dL). Recent Labs  Lab 05/14/20 0059 05/14/20 0115 05/14/20 0346 05/15/20 0224  WBC 4.8  --   --  3.0*  LATICACIDVEN  --  6.1* 2.8*  --     Liver Function Tests: Recent Labs  Lab 05/14/20 0059 05/15/20 0224  AST 27 16  ALT 40 29  ALKPHOS 64 40  BILITOT 0.6 0.7  PROT 6.6 5.2*  ALBUMIN 3.8 3.0*    No results for input(s): LIPASE, AMYLASE in the last 168 hours. No results for input(s): AMMONIA in the last 168 hours.  ABG    Component Value Date/Time   PHART 7.369 05/14/2020 0125   PCO2ART 44.7 05/14/2020 0125   PO2ART 438 (H) 05/14/2020 0125   HCO3 24.5 05/14/2020 0125   O2SAT 99.3 05/14/2020  0125     Coagulation Profile: Recent Labs  Lab 05/15/20 0224  INR 1.0    Cardiac Enzymes: Recent Labs  Lab 05/14/20 0346  CKTOTAL 46    HbA1C: Hgb A1c MFr Bld  Date/Time Value Ref Range Status  05/14/2020 10:45 AM 6.5 (H) 4.8 - 5.6 % Final    Comment:    (NOTE) Pre diabetes:          5.7%-6.4%  Diabetes:              >6.4%  Glycemic control for   <7.0% adults with diabetes   05/14/2020 03:46 AM 6.6 (H) 4.8 - 5.6 % Final    Comment:    (NOTE) Pre diabetes:          5.7%-6.4%  Diabetes:              >6.4%  Glycemic control for   <7.0% adults with diabetes     CBG: Recent Labs  Lab 05/14/20 0823 05/14/20 1247 05/14/20 1551 05/14/20 1953 05/14/20 2342  GLUCAP 171* 162* 153* 136* 185*     The patient is critically ill with multiple organ systems failure and requires high complexity decision making for assessment and support, frequent evaluation and titration of therapies, application of advanced monitoring technologies and extensive interpretation of multiple databases. Critical Care Time devoted to patient care services described in this note is 35 minutes.    Christinia Gully, MD Pulmonary and [place] [phone]   After 7:00 pm call Elink  [phone]

## 2020-05-15 NOTE — Plan of Care (Signed)
  Problem: Clinical Measurements: Goal: Diagnostic test results will improve Outcome: Progressing Goal: Respiratory complications will improve Outcome: Progressing   Problem: Nutrition: Goal: Adequate nutrition will be maintained Outcome: Progressing   

## 2020-05-15 NOTE — Progress Notes (Signed)
[place] Progress Note Patient Name: Wanda Ross DOB: 01/11/1951 MRN: [id number]   Date of Service  05/15/2020  HPI/Events of Note  Patient with recurrent urinary retention after Foley catheter was discontinued.  eICU Interventions  Foley catheter re-insertion ordered.        Kerry Kass Donica Derouin 05/15/2020, 4:15 AM

## 2020-05-16 DIAGNOSIS — D696 Thrombocytopenia, unspecified: Secondary | ICD-10-CM

## 2020-05-16 LAB — GLUCOSE, CAPILLARY
Glucose-Capillary: 138 mg/dL — ABNORMAL HIGH (ref 70–99)
Glucose-Capillary: 127 mg/dL — ABNORMAL HIGH (ref 70–99)
Glucose-Capillary: 125 mg/dL — ABNORMAL HIGH (ref 70–99)
Glucose-Capillary: 119 mg/dL — ABNORMAL HIGH (ref 70–99)
Glucose-Capillary: 109 mg/dL — ABNORMAL HIGH (ref 70–99)

## 2020-05-16 LAB — TRIGLYCERIDES: Triglycerides: 122 mg/dL (ref <150)

## 2020-05-16 MED ORDER — CLONIDINE HCL 0.1 MG PO TABS
0.1000 mg | ORAL_TABLET | Freq: Three times a day (TID) | ORAL | Status: DC
  Administered 2020-05-16 – 2020-05-19 (×10): 0.1 mg via ORAL
  Filled 2020-05-16 (×8): qty 1

## 2020-05-16 MED ORDER — AMLODIPINE BESYLATE 5 MG PO TABS
5.0000 mg | ORAL_TABLET | Freq: Every day | ORAL | Status: DC
  Administered 2020-05-16 – 2020-05-19 (×4): 5 mg via ORAL
  Filled 2020-05-16 (×4): qty 1

## 2020-05-16 MED ORDER — METOPROLOL TARTRATE 5 MG/5ML IV SOLN
2.5000 mg | Freq: Once | INTRAVENOUS | Status: DC
  Filled 2020-05-16: qty 5

## 2020-05-16 NOTE — Progress Notes (Addendum)
NAME:  Wanda Ross, MRN:  [id number], DOB:  December 17, 1950, LOS: 2 ADMISSION DATE:  05/14/2020, CONSULTATION DATE:  12/17 REFERRING MD:  Josephine Cables, CHIEF COMPLAINT:  Seizure   Brief History:  69 y/o female admitted from [place] on 12/17 after she had a seizure at home.  Required intubation in the [facility] ED and was then admitted to [facility].  She has a medical history notable for glioblastoma.  History of Present Illness:  69 y/o female admitted from [facility] on 12/17 after she had a seizure at home.  Required intubation in the [facility] ED and was then admitted to [facility]  She has a medical history notable for glioblastoma. She had 3 seizures (2 at home, 1 in ambulance), received midazolam on the way to the hospital.  Not protecting her airway in the [facility] ER so she was intubated. She was unable to provide a history here because of intubation.   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 chlorhexidine gluconate (MEDLINE KIT)  15 mL Mouth Rinse BID  . Chlorhexidine Gluconate Cloth  6 each Topical Daily  . dexamethasone (DECADRON) injection  4 mg Intravenous Q6H  . docusate  100 mg Per Tube BID  . feeding supplement (PROSource TF)  45 mL Per Tube 5 X Daily  . feeding supplement (VITAL HIGH PROTEIN)  1,000 mL Per Tube Q24H  . free water  100 mL Per Tube Q4H  . insulin aspart   0-15 Units Subcutaneous Q4H  . mouth rinse  15 mL Mouth Rinse 10 times per day  . multivitamin with minerals  1 tablet Per Tube Daily  . polyethylene glycol  17 g Per Tube Daily   Continuous Infusions: . famotidine (PEPCID) IV Stopped (05/15/20 2133)  . fentaNYL infusion INTRAVENOUS 50 mcg/hr (05/16/20 0730)  . levETIRAcetam Stopped (05/15/20 2213)  . propofol (DIPRIVAN) infusion 15 mcg/kg/min (05/16/20 0735)   PRN Meds:.fentaNYL (SUBLIMAZE) injection, fentaNYL (SUBLIMAZE) injection, midazolam, midazolam   Interim History / Subjective:  Extubated this am - no wob, good sats, marginal cough mechanics  Objective   Blood pressure (!) 186/93, pulse (!) 109, temperature 98 F (36.7 C), temperature source Oral, resp. rate 11, height '5\' 6"'  (1.676 m), weight 71.8 kg, SpO2 98 %.    Vent Mode: PSV;CPAP FiO2 (%):  [30 %] 30 % Set Rate:  [18 bmp] 18 bmp Vt Set:  [470 mL] 470 mL PEEP:  [5 cmH20] 5 cmH20 Pressure Support:  [8 cmH20] 8 cmH20 Plateau Pressure:  [15 cmH20-16 cmH20] 16 cmH20   Intake/Output Summary (Last 24 hours) at 05/16/2020 0930 Last data filed at 05/16/2020 1638 Gross per 24 hour  Intake 978.57 ml  Output 1950 ml  Net -971.43 ml   Filed Weights   05/14/20 0057 05/14/20 0704 05/16/20 0349  Weight: 63.5 kg 70.6 kg 71.8 kg    Examination:  Tmax  98.8  Pt alert,  knows hospital /name but not why she's here No jvd Oropharynx clear,  mucosa nl Neck supple Lungs with min  rhonchi bilaterally/ sats 100% on 3lpm  RRR no s3 or or sign murmur Abd soft/ nl excursion  Extr warm with no edema or clubbing noted          Resolved Hospital Problem list     Assessment & Plan:  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 Lab Results  Component Value Date   PLT 51 (L) 05/15/2020   PLT 55 (L) 05/14/2020   PLT 307 04/12/2020   PLT 170 02/05/2020   PLT 329 01/22/2020    SCD for DVT prophylaxis / recheck 12/20   Best practice (evaluated daily)  Diet: advance as tol  Pain/Anxiety/Delirium protocol (if indicated): as above VAP protocol (if indicated): yes DVT prophylaxis: SCD GI prophylaxis: Famotidine Glucose control: as above Mobility: up in chair as tol  Disposition: remain in ICU  Goals of Care:  Last date of multidisciplinary goals of care discussion: plan on 12/17 after MRI brain Family and staff present:  Summary of discussion:  Follow up goals of care discussion due:  Code Status: Full  Labs   CBC: Recent Labs  Lab 05/14/20 0059 05/15/20 0224  WBC 4.8 3.0*  NEUTROABS 2.5 2.3  HGB 16.0* 13.7  HCT 48.1* 40.1  MCV 93.6 89.5  PLT 55* 51*    Basic Metabolic Panel: Recent Labs  Lab 05/14/20 0059 05/14/20 0346 05/14/20 1045 05/14/20 1713 05/15/20 0224 05/15/20 1634  NA 140  --   --   --  140  --   K 3.3*  --   --   --  4.0  --   CL 102  --   --   --  106  --   CO2 21*  --   --   --  23  --   GLUCOSE 219*  --   --   --  193*  --   BUN 32*  --   --   --  33*  --   CREATININE 0.95  --   --   --  0.70  --   CALCIUM 8.6*  --   --   --  8.4*  --   MG  --  2.3 2.3 2.2 2.3 2.4  PHOS  --   --  3.7 3.7 3.7 3.2   GFR: Estimated Creatinine Clearance: 65.5 mL/min (by C-G formula based on SCr of 0.7 mg/dL). Recent Labs  Lab 05/14/20 0059 05/14/20 0115 05/14/20 0346 05/15/20 0224  WBC 4.8  --   --  3.0*  LATICACIDVEN  --  6.1* 2.8*  --     Liver Function Tests: Recent Labs  Lab 05/14/20 0059 05/15/20 0224  AST 27 16   ALT 40 29  ALKPHOS 64 40  BILITOT 0.6 0.7  PROT 6.6 5.2*  ALBUMIN 3.8 3.0*   No results for input(s): LIPASE, AMYLASE in the last 168 hours. No results for input(s): AMMONIA  in the last 168 hours.  ABG    Component Value Date/Time   PHART 7.369 05/14/2020 0125   PCO2ART 44.7 05/14/2020 0125   PO2ART 438 (H) 05/14/2020 0125   HCO3 24.5 05/14/2020 0125   O2SAT 99.3 05/14/2020 0125     Coagulation Profile: Recent Labs  Lab 05/15/20 0224  INR 1.0    Cardiac Enzymes: Recent Labs  Lab 05/14/20 0346  CKTOTAL 46    HbA1C: Hgb A1c MFr Bld  Date/Time Value Ref Range Status  05/14/2020 10:45 AM 6.5 (H) 4.8 - 5.6 % Final    Comment:    (NOTE) Pre diabetes:          5.7%-6.4%  Diabetes:              >6.4%  Glycemic control for   <7.0% adults with diabetes   05/14/2020 03:46 AM 6.6 (H) 4.8 - 5.6 % Final    Comment:    (NOTE) Pre diabetes:          5.7%-6.4%  Diabetes:              >6.4%  Glycemic control for   <7.0% adults with diabetes     CBG: Recent Labs  Lab 05/15/20 1633 05/15/20 1908 05/15/20 2344 05/16/20 0422 05/16/20 0824  GLUCAP 149* 160* 136* 138* 127*      Christinia Gully, MD Pulmonary and [place] Cell [phone]   After 7:00 pm call Elink  [phone]

## 2020-05-16 NOTE — Procedures (Signed)
Extubation Procedure Note  Patient Details:   Name: Wanda Ross DOB: 1950-07-28 MRN: [id number]   Airway Documentation:    Vent end date: 05/16/20 Vent end time: 0920   Evaluation  O2 sats: stable throughout Complications: No apparent complications Patient did tolerate procedure well. Bilateral Breath Sounds: Clear,Diminished   Yes   Pt was extubated to 3L [place] with saturations of 99%, will wean oxygen as needed. Pt was able to speak afterwards and had a positive cuff leak prior to extubation. Pt was suctioned prior and had a good strong cough afterwards. No stridor heard at this time. Vitals are stable at this time, RT will continue to monitor.   Marticia Reifschneider A Waver Dibiasio 05/16/2020, 9:21 AM

## 2020-05-16 NOTE — Consult Note (Signed)
Consultation Note Date: 05/16/2020   Patient Name: Wanda Ross  DOB: 1951/02/28  MRN: [id number]  Age / Sex: 69 y.o., female  PCP: Fayrene Helper, MD Referring Physician: Juanito Doom, MD  Reason for Consultation: Establishing goals of care  HPI/Patient Profile: 69 y.o. female  with past medical history of hyperlipidemia, hypertension, glioblastoma status post craniotomy and resection.  June, radiation and chemotherapy (follows with Dr. Mickeal Skinner) who presented to the emergency department at [facility] after seizures at home.  She was intubated for airway protection.  Wanda Ross reports that she has been talking with Wanda Ross 1 hour prior to onset of seizures and then was noted to have onset of generalized tonic-clonic activity.  She is currently admitted to [facility] long hospital after transfer from [facility].  She remains intubated and sedated and has been evaluated by neurology who recommended initiation of Keppra.  Palliative consulted for goals of care.  Clinical Assessment and Goals of Care: I saw and examined Wanda Ross.  She is intubated and sedated and cannot participate in goals of care conversation.  I called and was able to reach Wanda Ross, Wanda Ross.    I introduced palliative care as specialized medical care for people living with serious illness. It focuses on providing relief from the symptoms and stress of a serious illness. The goal is to improve quality of life for both the patient and the family.  We discussed clinical course as well as wishes moving forward in regard to Wanda Ross's care.    Wanda Ross reports that she is Wanda Ross surrogate decision-maker and they had completed [facility] POA paperwork.  She will look for it to bring a copy to the hospital.  We talked about the seriousness of Wanda illness and Wanda clinical course with a focus on Wanda nutrition, cognition, and functional  status over the past several weeks to months.  Wanda Ross reports understanding the seriousness of Wanda situation and that Wanda Ross is invested in a plan to continue any interventions that may add time or quality to Wanda life.  Discussed plan for continuation of care under the guidance of Dr. Mickeal Skinner.  Family appreciates his input very much and would like to wait until he returns (hopefully we can touch base with him on Monday) to have his recommendations based on Wanda current situation.  Additionally, Wanda Ross reports that she is hopeful that Wanda Ross will be able to be extubated and participate in conversations once we have further input from Dr. Mickeal Skinner.  Questions and concerns addressed.   PMT will continue to support holistically.  SUMMARY OF RECOMMENDATIONS   - Full code/full scope -Wanda Ross, Wanda Ross, reports that she is Wanda Ross's HC POA.  She reports paperwork has been completed to this effect.  I have asked Wanda to bring a copy of it for Wanda records here at the hospital. -Continue current interventions.  Wanda Ross is hopeful that Wanda Ross will be able to be extubated over the next few days and participate in conversations regarding  goals of care.  She would also like to wait until Dr. Mickeal Skinner returns (hopefully Monday) for him to weigh in on Wanda situation before they make further decisions regarding long-term goals of care. -We will plan to follow-up again on Monday.  Please call if there are other needs with which we can be of assistance in the interim.  Code Status/Advance Care Planning:  Full code  Prognosis:   Guarded  Discharge Planning: To Be Determined      Primary Diagnoses: Present on Admission: . Glioblastoma ([place]) . Hyperlipidemia . Benign essential HTN   I have reviewed the medical record, interviewed the patient and family, and examined the patient. The following aspects are pertinent.  Past Medical History:  Diagnosis Date  . Glaucoma   . Hyperlipidemia 2010  .  Hypertension 1990  . IGT (impaired glucose tolerance) 05/11/2010  . Multinodular goiter 2010   normal function   Social History   Socioeconomic History  . Marital status: Widowed    Spouse name: Not on file  . Number of children: 3  . Years of education: Not on file  . Highest education level: Not on file  Occupational History  . Occupation: employed   Tobacco Use  . Smoking status: Never Smoker  . Smokeless tobacco: Never Used  Vaping Use  . Vaping Use: Never used  Substance and Sexual Activity  . Alcohol use: No  . Drug use: No  . Sexual activity: Never    Birth control/protection: Post-menopausal  Other Topics Concern  . Not on file  Social History Narrative  . Not on file   Social Determinants of Health   Financial Resource Strain: Not on file  Food Insecurity: Not on file  Transportation Needs: Not on file  Physical Activity: Not on file  Stress: Not on file  Social Connections: Not on file   Family History  Problem Relation Age of Onset  . Dementia Father   . Dementia Ross   . Diabetes Ross   . Hypertension Ross   . Breast cancer Sister   . Hypertension Sister   . Diabetes Brother    Scheduled Meds: . chlorhexidine gluconate (MEDLINE KIT)  15 mL Mouth Rinse BID  . Chlorhexidine Gluconate Cloth  6 each Topical Daily  . dexamethasone (DECADRON) injection  4 mg Intravenous Q6H  . docusate  100 mg Per Tube BID  . feeding supplement (PROSource TF)  45 mL Per Tube 5 X Daily  . feeding supplement (VITAL HIGH PROTEIN)  1,000 mL Per Tube Q24H  . free water  100 mL Per Tube Q4H  . insulin aspart  0-15 Units Subcutaneous Q4H  . mouth rinse  15 mL Mouth Rinse 10 times per day  . multivitamin with minerals  1 tablet Per Tube Daily  . polyethylene glycol  17 g Per Tube Daily   Continuous Infusions: . famotidine (PEPCID) IV Stopped (05/15/20 2133)  . fentaNYL infusion INTRAVENOUS 50 mcg/hr (05/16/20 0730)  . levETIRAcetam Stopped (05/15/20 2213)  .  propofol (DIPRIVAN) infusion 15 mcg/kg/min (05/16/20 0735)   PRN Meds:.fentaNYL (SUBLIMAZE) injection, fentaNYL (SUBLIMAZE) injection, midazolam, midazolam Medications Prior to Admission:  Prior to Admission medications   Medication Sig Start Date End Date Taking? Authorizing Provider  acetaminophen (TYLENOL) 325 MG tablet Take 2 tablets (650 mg total) by mouth every 4 (four) hours as needed for mild pain (temp > 100.5). 12/02/19  Yes Guilford Shi, MD  alendronate (FOSAMAX) 70 MG tablet Take 1 tablet by mouth every Sunday.  12/14/19  Yes [provider]  amLODipine (NORVASC) 5 MG tablet Take 1 tablet (5 mg total) by mouth daily. 12/09/19  Yes Angiulli, Lavon Paganini, PA-C  Calcium Carbonate-Vit D-Min (CALCIUM 1200) 1200-1000 MG-UNIT CHEW Chew 1,200 mg by mouth daily. 12/09/19  Yes Angiulli, Lavon Paganini, PA-C  cloNIDine (CATAPRES) 0.3 MG tablet TAKE 1 TABLET BY MOUTH EVERYDAY AT BEDTIME Patient taking differently: Take 0.3 mg by mouth at bedtime. 12/09/19  Yes Angiulli, Lavon Paganini, PA-C  Cyanocobalamin 2500 MCG TABS Take 2,500 mcg by mouth daily. Patient taking differently: Take 2,500 mcg by mouth at bedtime. 12/09/19  Yes Angiulli, Lavon Paganini, PA-C  dexamethasone (DECADRON) 4 MG tablet Take 1 tablet (4 mg total) by mouth daily. 04/12/20  Yes Vaslow, Acey Lav, MD  docusate sodium (COLACE) 100 MG capsule Take 1 capsule (100 mg total) by mouth 2 (two) times daily. Patient taking differently: Take 100 mg by mouth 2 (two) times daily as needed for mild constipation. 12/02/19  Yes Guilford Shi, MD  dorzolamide-timolol (COSOPT) 22.3-6.8 MG/ML ophthalmic solution Place 1 drop into the right eye 2 (two) times daily.  06/06/19 06/05/20 Yes [provider]  Multiple Vitamins-Minerals (CENTRUM SILVER ULTRA WOMENS) TABS Take 1 tablet by mouth daily.   Yes [provider]  ondansetron (ZOFRAN) 8 MG tablet Take 1 tablet (8 mg total) by mouth 2 (two) times daily as needed (nausea and vomiting). May  take 30-60 minutes prior to Temodar administration if nausea/vomiting occurs. Patient taking differently: Take 8 mg by mouth 2 (two) times daily as needed for vomiting or nausea (nausea and vomiting). May take 30-60 minutes prior to Temodar administration if nausea/vomiting occurs. 03/08/20  Yes Vaslow, Acey Lav, MD  pravastatin (PRAVACHOL) 10 MG tablet TAKE 1 TABLET BY MOUTH DAILY *DOSE REDUCTION* Patient taking differently: Take 10 mg by mouth at bedtime. 12/09/19  Yes Angiulli, Lavon Paganini, PA-C  temozolomide (TEMODAR) 180 MG capsule Take 2 capsules (360 mg total) by mouth daily. May take on an empty stomach to decrease nausea & vomiting. Patient not taking: Reported on 05/14/2020 04/12/20   Ventura Sellers, MD   No Known Allergies Review of Systems Unable to obtain due to mental status  Physical Exam  General: Intubated and sedated  Heart: Regular rate and rhythm. No murmur appreciated. Lungs: Scattered rhonchi Abdomen: Soft, nontender, nondistended, positive bowel sounds.  Ext: No significant edema Skin: Warm and dry  Vital Signs: BP (!) 149/74   Pulse (!) 48   Temp 98 F (36.7 C) (Oral)   Resp 18   Ht 5' 6" (1.676 m)   Wt 71.8 kg   SpO2 100%   BMI 25.55 kg/m  Pain Scale: CPOT       SpO2: SpO2: 100 % O2 Device:SpO2: 100 % O2 Flow Rate: .O2 Flow Rate (L/min): 0 L/min  IO: Intake/output summary:   Intake/Output Summary (Last 24 hours) at 05/16/2020 0845 Last data filed at 05/16/2020 7858 Gross per 24 hour  Intake 978.57 ml  Output 1950 ml  Net -971.43 ml    LBM: Last BM Date:  (PTA) Baseline Weight: Weight: 63.5 kg Most recent weight: Weight: 71.8 kg     Palliative Assessment/Data:   Flowsheet Rows   Flowsheet Row Most Recent Value  Intake Tab   Referral Department Critical care  Unit at Time of Referral ICU  Palliative Care Primary Diagnosis Cancer  Date Notified 05/14/20  Palliative Care Type New Palliative care  Reason for referral Clarify Goals of  Care  Date of Admission 05/14/20  Date first seen by Palliative Care 05/15/20  # of days Palliative referral response time 1 Day(s)  # of days IP prior to Palliative referral 0  Clinical Assessment   Palliative Performance Scale Score 20%  Psychosocial & Spiritual Assessment   Palliative Care Outcomes   Patient/Family meeting held? Yes  Who was at the meeting? Ross, Wanda Ross, via phone      Time In: 1600  Time Out: 1700 Time Total: 60 Greater than 50%  of this time was spent counseling and coordinating care related to the above assessment and plan.  Signed by: Micheline Rough, MD   Please contact Palliative Medicine Team phone at [phone] for questions and concerns.  For individual provider: See Shea Evans

## 2020-05-16 NOTE — Progress Notes (Signed)
[facility] Progress Note Patient Name: Wanda Ross DOB: 04/30/1951 MRN: [id number]   Date of Service  05/16/2020  HPI/Events of Note  Hypertension - BP = 175/93.   eICU Interventions  Plan: 1. Restart home Amlodipine 5 mg PO now and Q day.      Intervention Category Major Interventions: Hypertension - evaluation and management  Emeree Mahler Eugene 05/16/2020, 10:34 PM

## 2020-05-17 LAB — GLUCOSE, CAPILLARY
Glucose-Capillary: 101 mg/dL — ABNORMAL HIGH (ref 70–99)
Glucose-Capillary: 105 mg/dL — ABNORMAL HIGH (ref 70–99)
Glucose-Capillary: 108 mg/dL — ABNORMAL HIGH (ref 70–99)
Glucose-Capillary: 122 mg/dL — ABNORMAL HIGH (ref 70–99)
Glucose-Capillary: 125 mg/dL — ABNORMAL HIGH (ref 70–99)
Glucose-Capillary: 156 mg/dL — ABNORMAL HIGH (ref 70–99)
Glucose-Capillary: 178 mg/dL — ABNORMAL HIGH (ref 70–99)

## 2020-05-17 LAB — TRIGLYCERIDES: Triglycerides: 97 mg/dL (ref <150)

## 2020-05-17 MED ORDER — LEVETIRACETAM 500 MG PO TABS
1000.0000 mg | ORAL_TABLET | Freq: Two times a day (BID) | ORAL | Status: DC
  Administered 2020-05-17 – 2020-05-19 (×4): 1000 mg via ORAL
  Filled 2020-05-17 (×4): qty 2

## 2020-05-17 MED ORDER — DEXAMETHASONE 4 MG PO TABS
4.0000 mg | ORAL_TABLET | Freq: Two times a day (BID) | ORAL | Status: DC
  Administered 2020-05-17 – 2020-05-19 (×4): 4 mg via ORAL
  Filled 2020-05-17 (×4): qty 1

## 2020-05-17 NOTE — Progress Notes (Signed)
Nutrition Follow-up  DOCUMENTATION CODES:   Not applicable  INTERVENTION:  - diet advancement as medically feasible. - will order appropriate oral nutrition supplements with diet advancement.   NUTRITION DIAGNOSIS:   Inadequate oral intake related to inability to eat as evidenced by NPO status. -ongoing  GOAL:   Patient will meet greater than or equal to 90% of their needs -unmet/unable to meet   MONITOR:   Diet advancement,Labs,Weight trends,Skin  ASSESSMENT:   69 year-old female with medical history of HTN, HLD, goiter, glioblastoma, impaired glucose tolerance, and glaucoma. She presented to the [facility] ED on 12/17 after having two seizures at home. She also had a third seizure while in the care of EMS. She was intubated in the ED and transferred to [facility].  Significant Events:  12/17- intubated in [facility] ED and then transferred to [facility]; OGT placed; initial RD assessment; EEG; TF initiation 12/19- extubated and OGT removed   Patient remains NPO since extubation yesterday. Patient currently laying in bed with no family/visitors present. Patient reports feeling good overall today and denies abdominal pain/pressure or nausea.   Dr. Gustavus Bryant note yesterday states to advance diet as tolerated.   She is noted to be a/o to self and person. RN is currently in another patient's room so unable to ask about plans concerning nutrition at this time.   Labs reviewed; CBGs: 125, 108, 105 mg/dl, BUN: 33 mg/dl, Ca: 8.4 mg/dl. Medications reviewed; 100 mg colace BID, 20 mg IV pepcid BID, sliding scale novolog, 1 tablet multivitamin with minerals/day, 17 g miralax/day.    Diet Order:   Diet Order    None      EDUCATION NEEDS:   Not appropriate for education at this time  Skin:  Skin Assessment: Skin Integrity Issues: Skin Integrity Issues:: Stage III Stage III: coccyx  Last BM:  PTA/unknown  Height:   Ht Readings from Last 1 Encounters:  05/16/20 5\' 6"  (1.676 m)    Weight:   Wt  Readings from Last 1 Encounters:  05/16/20 71.8 kg    Estimated Nutritional Needs:  Kcal:  2000-2200 kcal Protein:  100-110 grams Fluid:  >/= 1.8 L/day     Jarome Matin, MS, RD, LDN, CNSC Inpatient Clinical Dietitian RD pager # available in [place]  After hours/weekend pager # available in [facility]

## 2020-05-17 NOTE — Progress Notes (Addendum)
   NAME:  Wanda Ross, MRN:  [id number], DOB:  Nov 12, 1950, LOS: 3 ADMISSION DATE:  05/14/2020, CONSULTATION DATE:  12/17 REFERRING MD:  Josephine Cables, CHIEF COMPLAINT:  Seizure   Brief History:  69 y/o female admitted from [facility] on 12/17 after she had a seizure at home.  Required intubation in the [facility] ED and was then admitted to [facility].  She has a medical history notable for glioblastoma.   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 Marcello Moores 10/2019, GBM on path; treated with radiation therapy and Temozolomide; currently being treated with Temodar Hypertension Impaired glucose tolerance Hyperlipidemia Multinodular goiter Glaucoma  Significant Hospital Events:  12/17 admission  12/19 extubated  Consults:  Neurology Palliative  Care 12/18 > full code for now Procedures:  12/17 ETT > 12/19   Significant Diagnostic Tests:  12/17 CT head > NAICP, post operative changes of prior brain tumor resection  Micro Data:  12/17 SARS COV 2 / FLU > negative 12/17 MRSA PCR neg   Antimicrobials:    Interim History / Subjective:  Looks good.   Objective   Blood pressure 140/79, pulse 83, temperature 98.9 F (37.2 C), temperature source Oral, resp. rate 11, height 5\' 6"  (1.676 m), weight 71.8 kg, SpO2 95 %.        Intake/Output Summary (Last 24 hours) at 05/17/2020 1443 Last data filed at 05/17/2020 1200 Gross per 24 hour  Intake 731.21 ml  Output 3900 ml  Net -3168.79 ml   Filed Weights   05/14/20 0057 05/14/20 0704 05/16/20 0349  Weight: 63.5 kg 70.6 kg 71.8 kg    Examination:  General: Pleasant cooperative sitting up in bed no acute distress HEENT normocephalic atraumatic no jugular venous distention Pulmonary: Clear to auscultation Cardiac: Regular rate and rhythm Abdomen soft nontender Neuro: Awake, slow to respond, follows commands.  Oriented x2. 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  Can move out of ICU  Hypertension Plan Low-dose clonidine  Impaired glucose tolerance Plan Sliding scale insulin  Thrombocytopenia> Temodor effect? Last platelets staying in the mid 71s Plan A.m. CBC  Best practice (evaluated daily)  Diet: advance as tol  Pain/Anxiety/Delirium protocol (if indicated): as above VAP protocol (if indicated): yes DVT prophylaxis: SCD GI prophylaxis: Famotidine Glucose control: as above Mobility: up in chair as tol  Disposition:medsurg   Goals of Care:  Last date of multidisciplinary goals of care discussion: plan on 12/17 after MRI brain Family and staff present:  Summary of discussion:  Follow up goals of care discussion due:  Code Status: Full  Erick Colace ACNP-BC [place] Pager # [phone] OR # [phone] if no answer

## 2020-05-17 NOTE — TOC Initial Note (Signed)
Transition of Care [facility]) - Initial/Assessment Note    Patient Details  Name: Wanda Ross MRN: [id number] Date of Birth: December 12, 1950  Transition of Care [facility]) CM/SW Contact:    Leeroy Cha, RN Phone Number: 05/17/2020, 9:17 AM  Clinical Narrative:                 69 y.o. female  with past medical history of hyperlipidemia, hypertension, glioblastoma status post craniotomy and resection.  June, radiation and chemotherapy (follows with Dr. Mickeal Skinner) who presented to the emergency department at [facility] after seizures at home.  She was intubated for airway protection.  Her daughter reports that she has been talking with her mother 1 hour prior to onset of seizures and then was noted to have onset of generalized tonic-clonic activity.  She is currently admitted to [facility] long hospital after transfer from [facility].  She remains intubated and sedated and has been evaluated by neurology who recommended initiation of Keppra.  Palliative consulted for goals of care. Extubated [id number] Room air and alert on [id number] Following for progression Expected Discharge Plan: Home/Self Care Barriers to Discharge: No Barriers Identified   Patient Goals and CMS Choice        Expected Discharge Plan and Services Expected Discharge Plan: Home/Self Care   Discharge Planning Services: CM Consult   Living arrangements for the past 2 months: Single Family Home                                      Prior Living Arrangements/Services Living arrangements for the past 2 months: Single Family Home Lives with:: Self                   Activities of Daily Living Home Assistive Devices/Equipment: Dentures (specify type),Eyeglasses,Other (Comment) (step in shower) ADL Screening (condition at time of admission) Patient's cognitive ability adequate to safely complete daily activities?: No (patient currently intubated) Is the patient deaf or have difficulty hearing?: No Does the patient have  difficulty seeing, even when wearing glasses/contacts?: No Does the patient have difficulty concentrating, remembering, or making decisions?: Yes Patient able to express need for assistance with ADLs?: No Does the patient have difficulty dressing or bathing?: Yes Independently performs ADLs?: No Communication: Dependent (patient currently intubated) Is this a change from baseline?: Change from baseline, expected to last >3 days Dressing (OT): Dependent Is this a change from baseline?: Change from baseline, expected to last >3 days Grooming: Dependent Is this a change from baseline?: Change from baseline, expected to last >3 days Feeding: Dependent Is this a change from baseline?: Change from baseline, expected to last >3 days Bathing: Dependent Is this a change from baseline?: Change from baseline, expected to last >3 days Toileting: Dependent Is this a change from baseline?: Change from baseline, expected to last >3days In/Out Bed: Dependent Is this a change from baseline?: Change from baseline, expected to last >3 days Walks in Home: Dependent Is this a change from baseline?: Change from baseline, expected to last >3 days Does the patient have difficulty walking or climbing stairs?: Yes (secondary to weakness) Weakness of Legs: Both Weakness of Arms/Hands: Both  Permission Sought/Granted                  Emotional Assessment              Admission diagnosis:  Respiratory arrest ([place]) [R09.2] Hypokalemia [E87.6] Seizure ([place]) [  R56.9] Glioblastoma ([place]) [C71.9] Acute respiratory failure with hypoxia ([place]) [J96.01] Patient Active Problem List   Diagnosis Date Noted  . Acute respiratory failure with hypoxemia ([place]) 05/14/2020  . Seizures ([place]) 05/14/2020  . Thrombocytopenia ([place]) 05/14/2020  . Lactic acidosis 05/14/2020  . Hypokalemia 05/14/2020  . Hyperglycemia 05/14/2020  . Pressure injury of skin 05/14/2020  . Goals of care, counseling/discussion 12/15/2019  .  Glioblastoma ([place])   . Hypoalbuminemia due to protein-calorie malnutrition ([place])   . Transaminitis   . Benign essential HTN   . Prediabetes   . Seizure prophylaxis   . Glioma ([place]) 12/02/2019  . Right sided weakness   . Dyslipidemia   . Essential hypertension   . Steroid-induced hyperglycemia   . Leucocytosis   . Brain mass 11/22/2019  . 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 Abnormal electrocardiogram (ECG) (EKG) 12/31/2017  . White coat syndrome with diagnosis of hypertension 12/31/2017  . Glaucoma 12/26/2013  . Vitamin D deficiency 05/27/2009  . IMPAIRED GLUCOSE TOLERANCE 05/27/2009  . OSTEOPENIA 05/27/2009  . Multiple thyroid nodules 11/06/2007  . Hyperlipidemia 11/06/2007  . Malignant hypertension 11/06/2007   PCP:  Fayrene Helper, MD Pharmacy:   [id number] - [place], [place] [address] [place] [address] Phone: [phone] Fax: [phone]  [place], [place] - [place] [address] Phone: [phone] Fax: [phone]     Social Determinants of Health (SDOH) Interventions    Readmission Risk Interventions No flowsheet data found.

## 2020-05-17 NOTE — Evaluation (Signed)
Clinical/Bedside Swallow Evaluation Patient Details  Name: Wanda Ross MRN: [id number] Date of Birth: 06-09-1950  Today's Date: 05/17/2020 Time: SLP Start Time (ACUTE ONLY): 1420 SLP Stop Time (ACUTE ONLY): 1440 SLP Time Calculation (min) (ACUTE ONLY): 20 min  Past Medical History:  Past Medical History:  Diagnosis Date  . Glaucoma   . Hyperlipidemia 2010  . Hypertension 1990  . IGT (impaired glucose tolerance) 05/11/2010  . Multinodular goiter 2010   normal function   Past Surgical History:  Past Surgical History:  Procedure Laterality Date  . APPLICATION OF CRANIAL NAVIGATION Left 11/26/2019   Procedure: APPLICATION OF CRANIAL NAVIGATION;  Surgeon: [name], MD;  Location: [facility];  Service: Neurosurgery;  Laterality: Left;  APPLICATION OF CRANIAL NAVIGATION  . COLONOSCOPY N/A 06/16/2016   Procedure: COLONOSCOPY;  Surgeon: Danie Binder, MD;  [facility];  Service: Endoscopy;  Laterality: N/A;  9:30 AM  . COLONOSCOPY N/A 10/22/2019   Procedure: COLONOSCOPY;  Surgeon: Daneil Dolin, MD;  [facility];  Service: Endoscopy;  Laterality: N/A;  9:30  . CRANIOTOMY Left 11/26/2019   Procedure: LEFT PARIETAL CRANIOTOMY FOR RESECTION OF TUMOR;  Surgeon: [name], MD;  Location: [facility];  Service: Neurosurgery;  Laterality: Left;  LEFT PARIETAL CRANIOTOMY FOR RESECTION OF TUMOR  . EYE SURGERY  2017   3 on left eye 1 on right eye for glaucoma by Dr. Venetia Maxon   . EYE SURGERY  2018   Cataract Surgery by Dr. Arlina Robes   . OPERATIVE ULTRASOUND N/A 11/26/2019   Procedure: OPERATIVE ULTRASOUND;  Surgeon: [name], MD;  Location: [facility];  Service: Neurosurgery;  Laterality: N/A;  OPERATIVE ULTRASOUND   HPI:  69 y/o female admitted from [facility] on 12/17 after she had a seizure at home.  Required intubation in the [facility] ED and was then admitted to [facility].  She has a medical history notable for glioblastoma (resection 10/2019 left parietal  mass), rx therapy and temozolomide. She had 3 seizures (2 at home, 1 in ambulance), intubated 12/17-19. Is known to SLP services for language/cognitive treatment at inpt and OP levels of care, but has not needed f/u for swallowing.  Assessment / Plan / Recommendation Clinical Impression  Pt presents with functional oropharyngeal swallow with adequate oral attention and mastication, the appearance of a brisk swallow response, and no s/s of aspiration, even when taxed with consecutive thin liquid boluses.  Pt requires assistance with self-feeding due to perseveration/difficulty shifting motor patterns when feeding with utensils.  Recommend staff or family assistance when feeding. Otherwise,  pt is safe to resume a regular diet,thin liquids, give meds in water. No concerns for aspiration. SLP will sign off. SLP Visit Diagnosis: Dysphagia, unspecified (R13.10)    Aspiration Risk  No limitations    Diet Recommendation     Medication Administration: Whole meds with liquid    Other  Recommendations Oral Care Recommendations: Oral care BID   Follow up Recommendations None      Frequency and Duration     n/s       Swallow Study   General HPI: 69 y/o female admitted from [place] on 12/17 after she had a seizure at home.  Required intubation in the [facility] ED and was then admitted to [facility].  She has a medical history notable for glioblastoma (resection 10/2019 left parietal mass), rx therapy and temozolomide. She had 3 seizures (2 at home, 1 in ambulance), intubated 12/17-19. Type of  Study: Bedside Swallow Evaluation Previous Swallow Assessment: no, but has had speech/language treatment Diet Prior to this Study: Regular Temperature Spikes Noted: No Respiratory Status: Room air History of Recent Intubation: Yes Length of Intubations (days): 2 days Date extubated: 05/16/20 Behavior/Cognition: Alert;Cooperative;Pleasant mood Oral Cavity Assessment: Within Functional Limits Oral Care  Completed by SLP: Recent completion by staff Oral Cavity - Dentition: Adequate natural dentition Vision: Functional for self-feeding Self-Feeding Abilities: Needs assist Patient Positioning: Upright in bed Baseline Vocal Quality: Normal Volitional Cough: Strong Volitional Swallow: Able to elicit    Oral/Motor/Sensory Function Overall Oral Motor/Sensory Function: Mild impairment (mild right asymmetry)   Ice Chips Ice chips: Within functional limits   Thin Liquid Thin Liquid: Within functional limits    Nectar Thick Nectar Thick Liquid: Not tested   Honey Thick Honey Thick Liquid: Not tested   Puree Puree: Within functional limits   Solid     Solid: Within functional limits      Juan Quam Laurice 05/17/2020,2:43 PM  Estill Bamberg L. Tivis Ringer, [place] Office number [phone] Pager [phone]

## 2020-05-18 ENCOUNTER — Other Ambulatory Visit: Payer: Self-pay

## 2020-05-18 LAB — COMPREHENSIVE METABOLIC PANEL
ALT: 37 U/L (ref 0–44)
AST: 25 U/L (ref 15–41)
Albumin: 3.4 g/dL — ABNORMAL LOW (ref 3.5–5.0)
Alkaline Phosphatase: 59 U/L (ref 38–126)
Anion gap: 14 (ref 5–15)
BUN: 38 mg/dL — ABNORMAL HIGH (ref 8–23)
CO2: 25 mmol/L (ref 22–32)
Calcium: 9.2 mg/dL (ref 8.9–10.3)
Chloride: 102 mmol/L (ref 98–111)
Creatinine, Ser: 1.05 mg/dL — ABNORMAL HIGH (ref 0.44–1.00)
GFR, Estimated: 57 mL/min — ABNORMAL LOW (ref >60)
Glucose, Bld: 161 mg/dL — ABNORMAL HIGH (ref 70–99)
Potassium: 4.1 mmol/L (ref 3.5–5.1)
Sodium: 141 mmol/L (ref 135–145)
Total Bilirubin: 0.6 mg/dL (ref 0.3–1.2)
Total Protein: 6.6 g/dL (ref 6.5–8.1)

## 2020-05-18 LAB — CBC
HCT: 46.7 % — ABNORMAL HIGH (ref 36.0–46.0)
Hemoglobin: 15.9 g/dL — ABNORMAL HIGH (ref 12.0–15.0)
MCH: 30.8 pg (ref 26.0–34.0)
MCHC: 34 g/dL (ref 30.0–36.0)
MCV: 90.3 fL (ref 80.0–100.0)
Platelets: 84 10*3/uL — ABNORMAL LOW (ref 150–400)
RBC: 5.17 MIL/uL — ABNORMAL HIGH (ref 3.87–5.11)
RDW: 14.3 % (ref 11.5–15.5)
WBC: 3.2 10*3/uL — ABNORMAL LOW (ref 4.0–10.5)
nRBC: 0 % (ref 0.0–0.2)

## 2020-05-18 LAB — MAGNESIUM: Magnesium: 2.4 mg/dL (ref 1.7–2.4)

## 2020-05-18 NOTE — Evaluation (Signed)
Physical Therapy Evaluation Patient Details Name: Wanda Ross MRN: [id number] DOB: Nov 20, 1950 Today's Date: 05/18/2020   History of Present Illness  69 y.o. female  with past medical history of hyperlipidemia, hypertension, glioblastoma status post craniotomy and resection.  June, radiation and chemotherapy (follows with Dr. Mickeal Skinner) who presented to the emergency department at [facility] after seizures at home.  She was intubated for airway protection  Clinical Impression  Pt admitted with above diagnosis. Pt ambulated 38' with RW with mod assist for loss of balance x 1. Pt is oriented to self but not to month. She reported she has 24* assist from her daughter at home, will need to confirm this. She is a high fall risk and needs close supervision for ambulation. Pt currently with functional limitations due to the deficits listed below (see PT Problem List). Pt will benefit from skilled PT to increase their independence and safety with mobility to allow discharge to the venue listed below.       Follow Up Recommendations Home health PT;Supervision/Assistance - 24 hour;Supervision for mobility/OOB    Equipment Recommendations  None recommended by PT    Recommendations for Other Services       Precautions / Restrictions Precautions Precautions: Fall Precaution Comments: pt denies h/o falls in past 1 year, need to confirm with family Restrictions Weight Bearing Restrictions: No      Mobility  Bed Mobility               General bed mobility comments: up in recliner    Transfers Overall transfer level: Needs assistance Equipment used: Rolling walker (2 wheeled) Transfers: Sit to/from Stand Sit to Stand: Min assist         General transfer comment: mild posterior lean initially but no loss of balance  Ambulation/Gait Ambulation/Gait assistance: Min guard;Mod assist Gait Distance (Feet): 90 Feet Assistive device: Rolling walker (2 wheeled) Gait  Pattern/deviations: Step-through pattern;Scissoring;Narrow base of support Gait velocity: decr   General Gait Details: scissoring x 1 while walking straight forward with significant loss of balance requiring mod A to recover, otherwise min/guard assist  Stairs            Wheelchair Mobility    Modified Rankin (Stroke Patients Only)       Balance Overall balance assessment: Needs assistance   Sitting balance-Leahy Scale: Good       Standing balance-Leahy Scale: Poor Standing balance comment: relies on BUE support                             Pertinent Vitals/Pain Pain Assessment: No/denies pain    Home Living Family/patient expects to be discharged to:: Private residence Living Arrangements: Children Available Help at Discharge: Family;Available 24 hours/day Type of Home: House Home Access: Stairs to enter Entrance Stairs-Rails: None Entrance Stairs-Number of Steps: 2 Home Layout: One level Home Equipment: Walker - 4 wheels;Shower seat Additional Comments: uses rollator outside    Prior Function Level of Independence: Needs assistance   Gait / Transfers Assistance Needed: walks independently in home, rollator going out  ADL's / Homemaking Assistance Needed: daughter assists with bathing/dressing  Comments: likes gardening and lawnwork     Hand Dominance        Extremity/Trunk Assessment   Upper Extremity Assessment Upper Extremity Assessment: Overall WFL for tasks assessed    Lower Extremity Assessment Lower Extremity Assessment: Overall WFL for tasks assessed    Cervical / Trunk Assessment Cervical / Trunk  Assessment: Normal  Communication   Communication: No difficulties  Cognition Arousal/Alertness: Awake/alert Behavior During Therapy: WFL for tasks assessed/performed Overall Cognitive Status: No family/caregiver present to determine baseline cognitive functioning                                 General Comments:  pt oriented to self, knows she's in the hospital (stated she's at [facility]), able to state year but not month      General Comments      Exercises     Assessment/Plan    PT Assessment Patient needs continued PT services  PT Problem List Decreased mobility;Decreased balance       PT Treatment Interventions Therapeutic activities;Therapeutic exercise;Gait training;Functional mobility training;Balance training;Patient/family education    PT Goals (Current goals can be found in the Care Plan section)  Acute Rehab PT Goals PT Goal Formulation: Patient unable to participate in goal setting Time For Goal Achievement: 06/01/20 Potential to Achieve Goals: Fair    Frequency Min 3X/week   Barriers to discharge        Co-evaluation               AM-PAC PT "6 Clicks" Mobility  Outcome Measure Help needed turning from your back to your side while in a flat bed without using bedrails?: A Little Help needed moving from lying on your back to sitting on the side of a flat bed without using bedrails?: A Little Help needed moving to and from a bed to a chair (including a wheelchair)?: A Little Help needed standing up from a chair using your arms (e.g., wheelchair or bedside chair)?: A Little Help needed to walk in hospital room?: A Lot Help needed climbing 3-5 steps with a railing? : A Lot 6 Click Score: 16    End of Session Equipment Utilized During Treatment: Gait belt Activity Tolerance: Patient tolerated treatment well Patient left: in chair;with call bell/phone within reach;with chair alarm set Nurse Communication: Mobility status PT Visit Diagnosis: Difficulty in walking, not elsewhere classified (R26.2)    Time: [id number] PT Time Calculation (min) (ACUTE ONLY): 14 min   Charges:   PT Evaluation $PT Eval Low Complexity: 1 Low         Philomena Doheny PT 05/18/2020  Acute Rehabilitation Services Pager [phone] Office [phone]

## 2020-05-18 NOTE — Progress Notes (Signed)
PROGRESS NOTE    Wanda Ross  DGU:[id number] DOB: [birth year] DOA: 05/14/2020 PCP: Fayrene Helper, MD  Brief Narrative: Patient was admitted on 05/14/2020 by PCCM.  He was transferred from [facility] on that same day after she had a seizure at home.  She was intubated in the ED and then admitted to [facility].  She has history of glioblastoma.  She received midazolam and was not able to protect her airway therefore she was intubated. Her past medical history significant for left frontal glioblastoma status post resection of the mass by Dr. Marcello Moores in June 2021.  Treated with radiation and chemo. She was intubated 05/14/2020 and extubated 05/16/2020 she was seen in consultation by neurology and palliative care.  CT head showed postop changes.  Covid negative.  MRSA negative. TRH pickup 05/18/2020.  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  Neurology signed off.  Continue Decadron 4 mg twice a day as she was taking at home.  #2 history of essential hypertension on Norvasc 5 mg daily, clonidine 0.1 mg 3 times a day  #3 hyperglycemia likely induced by steroids.  Check hemoglobin A1c.  #4 stage III coccyx pressure ulcer present on admission consult wound care.  #5 thrombocytopenia platelet count 84 improving.  Follow-up labs in a.m.  #6 osteoporosis on Fosamax at home  #7 hyperlipidemia on pravastatin  #8 goals of care patient full code  #9 severe deconditioning PT consulted  #10 history of multinodular goiter needs follow-up as an outpatient  #11 hypokalemia few days ago recheck labs today.  Check  magnesium.  Pressure Injury 05/14/20 Coccyx Medial Stage 3 -  Full thickness tissue loss. Subcutaneous fat may be visible but bone, tendon or muscle are NOT exposed. One larger stage 3 with smaller stage threes around. (Active)  05/14/20 0700  Location: Coccyx  Location Orientation: Medial  Staging: Stage 3 -  Full thickness tissue loss. Subcutaneous fat may be visible but bone, tendon or muscle are NOT exposed.  Wound Description (Comments): One larger stage 3 with smaller stage threes around.  Present on Admission: Yes      Nutrition Problem: Inadequate oral intake Etiology: inability to eat     Signs/Symptoms: NPO status    Interventions: Refer to RD note for recommendations  Estimated body mass index is 25.55 kg/m as calculated from the following:   Height as of this encounter: 5\' 6"  (1.676 m).   Weight as of this encounter: 71.8 kg.  DVT prophylaxis: None due to thrombocytopenia Code Status: Full code Family Communication: None at bedside, she lives at home with her daughter  disposition Plan:  Status is: Inpatient   Dispo: The patient is from: Home              Anticipated d/c is to: Home              Anticipated d/c date is: 1 day              Patient currently is not medically stable to d/c.    Consultants:   PCCM and neurology  Procedures: Status post intubation  and extubation antimicrobials: None Subjective: Patient resting in bed she is awake and alert trying to eat breakfast answer all my questions appropriately and follows commands  Objective: Vitals:   05/18/20 0044 05/18/20 0535 05/18/20 0903 05/18/20 1233  BP: 131/76 137/76 129/79 135/86  Pulse: 67 79 85 81  Resp: 16 16 18 18   Temp: 98.7 F (37.1 C) 98.4 F (36.9 C) 98.2 F (36.8 C) 98.5 F (36.9 C)  TempSrc: Oral Oral Oral Oral  SpO2: 97% 98% 97% 96%  Weight:      Height:        Intake/Output Summary (Last 24 hours) at 05/18/2020 1453 Last data filed at 05/18/2020 1030 Gross per 24  hour  Intake 301.13 ml  Output 2500 ml  Net -2198.87 ml   Filed Weights   05/14/20 0057 05/14/20 0704 05/16/20 0349  Weight: 63.5 kg 70.6 kg 71.8 kg    Examination:  General exam: Appears calm and comfortable  Respiratory system: Clear to auscultation. Respiratory effort normal. Cardiovascular system: S1 & S2 heard, RRR. No JVD, murmurs, rubs, gallops or clicks. No pedal edema. Gastrointestinal system: Abdomen is nondistended, soft and nontender. No organomegaly or masses felt. Normal bowel sounds heard. Central nervous system: Alert and oriented. No focal neurological deficits. Extremities: Symmetric 5 x 5 power. Skin: No rashes, lesions or ulcers Psychiatry: Judgement and insight appear normal. Mood & affect appropriate.     Data Reviewed: I have personally reviewed following labs and imaging studies  CBC: Recent Labs  Lab 05/14/20 0059 05/15/20 0224 05/18/20 0545  WBC 4.8 3.0* 3.2*  NEUTROABS 2.5 2.3  --   HGB 16.0* 13.7 15.9*  HCT 48.1* 40.1 46.7*  MCV 93.6 89.5 90.3  PLT 55* 51* 84*   Basic Metabolic Panel: Recent Labs  Lab 05/14/20 0059 05/14/20 0346 05/14/20 1045 05/14/20 1713 05/15/20 0224 05/15/20 1634  NA 140  --   --   --  140  --   K 3.3*  --   --   --  4.0  --   CL 102  --   --   --  106  --   CO2 21*  --   --   --  23  --   GLUCOSE 219*  --   --   --  193*  --   BUN 32*  --   --   --  33*  --   CREATININE 0.95  --   --   --  0.70  --   CALCIUM 8.6*  --   --   --  8.4*  --   MG  --  2.3 2.3 2.2 2.3 2.4  PHOS  --   --  3.7 3.7 3.7 3.2   GFR: Estimated Creatinine Clearance: 65.5 mL/min (by C-G formula based on SCr of 0.7 mg/dL). Liver Function Tests: Recent Labs  Lab 05/14/20 0059 05/15/20 0224  AST 27 16  ALT 40 29  ALKPHOS 64 40  BILITOT 0.6 0.7  PROT 6.6 5.2*  ALBUMIN 3.8 3.0*   No results for input(s): LIPASE, AMYLASE in the last 168 hours. No results for input(s): AMMONIA in the last 168 hours. Coagulation Profile: Recent Labs   Lab 05/15/20 0224  INR 1.0   Cardiac Enzymes: Recent Labs  Lab 05/14/20 0346  CKTOTAL 46   BNP (last 3 results) No results for input(s): PROBNP in the last 8760 hours. HbA1C: No results for input(s): HGBA1C in the last 72 hours. CBG: Recent Labs  Lab 05/17/20 0001 05/17/20 0328 05/17/20 0738 05/17/20 1152 05/17/20 1715  GLUCAP 125* 108* 105* 101* 122*   Lipid Profile: Recent Labs    05/16/20 0229 05/17/20 0217  TRIG 122 97   Thyroid Function Tests: No results for input(s): TSH, T4TOTAL, FREET4, T3FREE, THYROIDAB in the last 72 hours. Anemia Panel: No results for input(s): VITAMINB12, FOLATE, FERRITIN, TIBC, IRON, RETICCTPCT in the last 72 hours. Sepsis Labs: Recent Labs  Lab 05/14/20 0115 05/14/20 0346  LATICACIDVEN 6.1* 2.8*    Recent Results (from the past 240 hour(s))  Resp Panel by RT-PCR (Flu A&B, Covid) Nasopharyngeal Swab     Status: None   Collection Time: 05/14/20  1:13 AM   Specimen: Nasopharyngeal Swab; Nasopharyngeal(NP) swabs in vial transport medium  Result Value Ref Range Status   SARS Coronavirus 2 by RT PCR NEGATIVE NEGATIVE Final    Comment: (NOTE) SARS-CoV-2 target nucleic acids are NOT DETECTED.  The SARS-CoV-2 RNA is generally detectable in upper respiratory specimens during the acute phase of infection. The lowest concentration of SARS-CoV-2 viral copies this assay can detect is 138 copies/mL. A negative result does not preclude SARS-Cov-2 infection and should not be used as the sole basis for treatment or other patient management decisions. A negative result may occur with  improper specimen collection/handling, submission of specimen other than nasopharyngeal swab, presence of viral mutation(s) within the areas targeted by this assay, and inadequate number of viral copies(<138 copies/mL). A negative result must be combined with clinical observations, patient history, and epidemiological information. The expected result is  Negative.  Fact Sheet for Patients:  EntrepreneurPulse.com.au  Fact Sheet for Healthcare Providers:  IncredibleEmployment.be  This test is no t yet approved or cleared by the [place] FDA and  has been authorized for detection and/or diagnosis of SARS-CoV-2 by FDA under an Emergency Use Authorization (EUA). This EUA will remain  in effect (meaning this test can be used) for the duration of the COVID-19 declaration under Section 564(b)(1) of the Act, 21 U.S.C.section 360bbb-3(b)(1), unless the authorization is terminated  or revoked sooner.       Influenza A by PCR NEGATIVE NEGATIVE Final   Influenza B by PCR NEGATIVE NEGATIVE Final    Comment: (NOTE) The Xpert Xpress SARS-CoV-2/FLU/RSV plus assay is intended as an aid in the diagnosis of influenza from Nasopharyngeal swab specimens and should not be used as a sole basis for treatment. Nasal washings and aspirates are unacceptable for Xpert Xpress SARS-CoV-2/FLU/RSV testing.  Fact Sheet for Patients: EntrepreneurPulse.com.au  Fact Sheet for Healthcare Providers: IncredibleEmployment.be  This test is not yet approved or cleared by the [place] FDA and has been authorized for detection and/or diagnosis of SARS-CoV-2 by FDA under an Emergency Use Authorization (EUA). This EUA will remain in effect (meaning this test can be used) for the duration of the COVID-19 declaration under Section 564(b)(1) of the Act, 21 U.S.C. section 360bbb-3(b)(1), unless the authorization is terminated or revoked.  Performed at [facility], [address], [place], [place] [address]   MRSA PCR Screening     Status: None   Collection Time: 05/14/20  6:57 AM   Specimen: Nasal Mucosa; Nasopharyngeal  Result Value Ref Range Status   MRSA by PCR NEGATIVE NEGATIVE Final    Comment:        The GeneXpert MRSA Assay (FDA approved for NASAL specimens only), is one component  of a comprehensive MRSA colonization surveillance program. It is not intended to diagnose MRSA infection nor to guide  or monitor treatment for MRSA infections. Performed at [facility], [place] [address], [place], [place] [address]          Radiology Studies: No results found.      Scheduled Meds: . amLODipine  5 mg Oral Daily  . Chlorhexidine Gluconate Cloth  6 each Topical Daily  . cloNIDine  0.1 mg Oral TID  . dexamethasone  4 mg Oral BID  . levETIRAcetam  1,000 mg Oral BID  . multivitamin with minerals  1 tablet Per Tube Daily   Continuous Infusions:   LOS: 4 days     Georgette Shell, MD 05/18/2020, 2:53 PM

## 2020-05-19 LAB — MAGNESIUM: Magnesium: 2.2 mg/dL (ref 1.7–2.4)

## 2020-05-19 LAB — CBC
HCT: 49 % — ABNORMAL HIGH (ref 36.0–46.0)
Hemoglobin: 17 g/dL — ABNORMAL HIGH (ref 12.0–15.0)
MCH: 30.6 pg (ref 26.0–34.0)
MCHC: 34.7 g/dL (ref 30.0–36.0)
MCV: 88.1 fL (ref 80.0–100.0)
Platelets: 113 10*3/uL — ABNORMAL LOW (ref 150–400)
RBC: 5.56 MIL/uL — ABNORMAL HIGH (ref 3.87–5.11)
RDW: 14.3 % (ref 11.5–15.5)
WBC: 4.8 10*3/uL (ref 4.0–10.5)
nRBC: 0 % (ref 0.0–0.2)

## 2020-05-19 LAB — COMPREHENSIVE METABOLIC PANEL
ALT: 36 U/L (ref 0–44)
AST: 22 U/L (ref 15–41)
Albumin: 3.2 g/dL — ABNORMAL LOW (ref 3.5–5.0)
Alkaline Phosphatase: 49 U/L (ref 38–126)
Anion gap: 12 (ref 5–15)
BUN: 32 mg/dL — ABNORMAL HIGH (ref 8–23)
CO2: 24 mmol/L (ref 22–32)
Calcium: 8.9 mg/dL (ref 8.9–10.3)
Chloride: 103 mmol/L (ref 98–111)
Creatinine, Ser: 0.72 mg/dL (ref 0.44–1.00)
GFR, Estimated: >60 mL/min (ref >60)
Glucose, Bld: 117 mg/dL — ABNORMAL HIGH (ref 70–99)
Potassium: 4.3 mmol/L (ref 3.5–5.1)
Sodium: 139 mmol/L (ref 135–145)
Total Bilirubin: 0.8 mg/dL (ref 0.3–1.2)
Total Protein: 5.8 g/dL — ABNORMAL LOW (ref 6.5–8.1)

## 2020-05-19 MED ORDER — DEXAMETHASONE 4 MG PO TABS: 4.0000 mg | ORAL_TABLET | Freq: Two times a day (BID) | ORAL | 0 refills | Status: DC

## 2020-05-19 MED ORDER — PRAVASTATIN SODIUM 10 MG PO TABS: 10.0000 mg | ORAL_TABLET | Freq: Every day | ORAL | Status: DC

## 2020-05-19 MED ORDER — DOCUSATE SODIUM 100 MG PO CAPS: 100.0000 mg | ORAL_CAPSULE | Freq: Two times a day (BID) | ORAL | Status: DC | PRN

## 2020-05-19 MED ORDER — LEVETIRACETAM 1000 MG PO TABS: 1000.0000 mg | ORAL_TABLET | Freq: Two times a day (BID) | ORAL | 0 refills | Status: AC

## 2020-05-19 NOTE — Progress Notes (Signed)
Call made to primary nurse regarding consult for IV. Pt currently has no IV medications; per nurse, no seizures noted post admission. Recommend holding on IV access in the interest of vein preservation. VAST available for further consult if needed.

## 2020-05-19 NOTE — Discharge Summary (Signed)
Physician Discharge Summary  CECLIA Ross [id number] DOB: 06/01/1950 DOA: 05/14/2020  PCP: Fayrene Helper, MD  Admit date: 05/14/2020 Discharge date: 05/19/2020  Admitted From: Home Disposition: Home  Recommendations for Outpatient Follow-up:  1. Follow up with PCP in 1 week with repeat CBC/BMP 2. Outpatient follow-up with neuro-oncology and neurology 3. Recommend outpatient evaluation and follow-up by palliative care 4. 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 Marcello Moores treated with radiation and chemotherapy, hypertension presented to [facility] on 05/14/2020 with seizure at home.  She was intubated in the ED and admitted under PCCM service.  CT head showed postop changes.  Neurology was consulted.  She was extubated on 05/16/2020.  She was started on Keppra and Decadron dose was increased to twice a day as per neurology recommendations.  Palliative care was consulted.  She currently remains full code.  She was transferred to [facility] service on 05/18/2020.  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  Neurology has evaluated the patient and signed off.  She is currently on Decadron twice a day and Keppra twice a day orally.  This will be continued on discharge. -SLP has evaluated the patient.  Patient is currently tolerating diet.  She will need home health PT upon discharge. -No more seizures since admission.  She is currently stable for  discharge and will be discharged home with outpatient follow-up with PCP/neurology and neuro-oncology -Currently on room air.  PCCM has signed off -Currently remains full code.  Recommend outpatient palliative care follow-up.  Patient was evaluated by palliative care during this hospitalization  Essential hypertension -Continue home regimen.  Outpatient follow-up  Stage III coccyx pressure ulcer present on admission -Follow wound care consult recommendations  Thrombocytopenia -Questionable cause.  Improving.  Outpatient follow-up  Osteoporosis -Continue Fosamax  Dyslipidemia  -continue pravastatin  History of multinodular goiter -Outpatient follow-up with PCP  Hypokalemia -Resolved    Discharge Instructions  Discharge Instructions    Amb Referral to Neuro Oncology   Complete by: As directed    followup   Amb Referral to Palliative Care   Complete by: As directed    Goals of care   Ambulatory referral to Neurology   Complete by: As directed    An appointment is requested in approximately: in 1-2 weeks for seizure followup   Diet - low sodium heart healthy   Complete by: As directed    Discharge wound care:   Complete by: As directed    Apply moist 2X2 gauze packing to sacrum wounds Q day, then cover with foam dressing.  (Change foam dressing Q 3 days or PRN soiling.)   Increase activity slowly   Complete by: As directed      Allergies as of 05/19/2020   No Known Allergies     Medication List    STOP taking these medications   temozolomide 180 MG capsule Commonly known as: TEMODAR     TAKE these medications   acetaminophen 325 MG tablet Commonly known as: TYLENOL Take 2 tablets (650 mg total) by mouth every  4 (four) hours as needed for mild pain (temp > 100.5).   alendronate 70 MG tablet Commonly known as: FOSAMAX Take 1 tablet by mouth every Sunday.   amLODipine 5 MG tablet Commonly known as: NORVASC Take 1 tablet (5 mg total) by mouth daily.    Calcium 1200 1200-1000 MG-UNIT Chew Chew 1,200 mg by mouth daily.   Centrum Silver Ultra Womens Tabs Take 1 tablet by mouth daily.   cloNIDine 0.3 MG tablet Commonly known as: CATAPRES TAKE 1 TABLET BY MOUTH EVERYDAY AT BEDTIME What changed:   how much to take  how to take this  when to take this  additional instructions   Cyanocobalamin 2500 MCG Tabs Take 2,500 mcg by mouth daily. What changed: when to take this   dexamethasone 4 MG tablet Commonly known as: DECADRON Take 1 tablet (4 mg total) by mouth 2 (two) times daily. What changed: when to take this   docusate sodium 100 MG capsule Commonly known as: COLACE Take 1 capsule (100 mg total) by mouth 2 (two) times daily as needed for mild constipation.   dorzolamide-timolol 22.3-6.8 MG/ML ophthalmic solution Commonly known as: COSOPT Place 1 drop into the right eye 2 (two) times daily.   levETIRAcetam 1000 MG tablet Commonly known as: KEPPRA Take 1 tablet (1,000 mg total) by mouth 2 (two) times daily.   ondansetron 8 MG tablet Commonly known as: Zofran Take 1 tablet (8 mg total) by mouth 2 (two) times daily as needed (nausea and vomiting). May take 30-60 minutes prior to Temodar administration if nausea/vomiting occurs. What changed: reasons to take this   pravastatin 10 MG tablet Commonly known as: PRAVACHOL Take 1 tablet (10 mg total) by mouth at bedtime.            Discharge Care Instructions  (From admission, onward)         Start     Ordered   05/19/20 0000  Discharge wound care:       Comments: Apply moist 2X2 gauze packing to sacrum wounds Q day, then cover with foam dressing.  (Change foam dressing Q 3 days or PRN soiling.)   05/19/20 0923          Follow-up Information    Junco, Margaret E, MD. Schedule an appointment as soon as possible for a visit in 1 week(s).   Specialty: Family Medicine Why: with cbc/bmp Contact information: [address] [place]  [address] [phone]        Vaslow, Zachary K, MD. Schedule an appointment as soon as possible for a visit in 1 week(s).   Specialties: Psychiatry, Neurology, Oncology Contact information: [address] [place] [address] [phone]        Care, [facility] Follow up.   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 EEG to evaluate for seizure. Level of alertness:  comatose AEDs during EEG study: LEV, propofol Technical aspects: This EEG study was done with scalp electrodes positioned according to the  10-20 International system of electrode placement. Electrical activity was acquired at a sampling rate of 500Hz  and reviewed with a high frequency filter of 70Hz  and a low frequency filter of 1Hz . EEG data were recorded continuously and digitally stored. Description: EEG showed continuous generalized polymorphic frequencies with predominantly 3 to 6 Hz theta-delta slowing as well as 15-18hz  generalized beta activity. Hyperventilation and photic stimulation were not performed.   ABNORMALITY -Continuous slow, generalized IMPRESSION: This study is suggestive of severe diffuse encephalopathy, nonspecific etiology but likely related to sedation. No seizures or epileptiform discharges were seen throughout the recording. Lora Havens   CT Head Wo Contrast  Result Date: 05/14/2020 CLINICAL DATA:  Seizure EXAM: CT HEAD WITHOUT CONTRAST TECHNIQUE: Contiguous axial images were  obtained from the base of the skull through the vertex without intravenous contrast. COMPARISON:  11/22/2019 FINDINGS: Brain: Postoperative changes of prior brain tumor resection centered at the paramedian left parietal lobe and corpus callosum splenium. No intracranial hemorrhage or extra-axial collection. No acute abnormality. Vascular: No abnormal hyperdensity of the major intracranial arteries or dural venous sinuses. No intracranial atherosclerosis. Skull: Remote left parietal craniotomy. Sinuses/Orbits: No fluid levels or advanced mucosal thickening of the visualized paranasal sinuses. No mastoid or middle ear effusion. The orbits are normal. IMPRESSION: 1. No acute intracranial abnormality. 2. Postoperative changes of prior brain tumor resection. Electronically Signed   By: Ulyses Jarred M.D.   On: 05/14/2020 03:03   MR BRAIN W WO CONTRAST  Result Date: 05/14/2020 CLINICAL DATA:  Seizure.  History of glioblastoma. EXAM: MRI HEAD WITHOUT AND WITH CONTRAST TECHNIQUE: Multiplanar, multiecho pulse sequences of the brain and surrounding structures were obtained without and with intravenous contrast. CONTRAST:  49mL GADAVIST GADOBUTROL 1 MMOL/ML IV SOLN COMPARISON:  Head CT same day.  MRI 03/10/2020. FINDINGS: Brain: Pronounced favorable changes since the study of October. The brainstem and cerebellum remain normal. There is less T2 and FLAIR signal within the white matter within the posterior aspects of both cerebral hemispheres. Enhancing tissue along the margins of the previous resection has markedly involuted. There is a 9 mm enhancing nodule of tissue adjacent to the midline which is a new finding since the previous study. Post resection space appears similar to the previous study. No new areas of increasing mass effect or contrast enhancement seen otherwise. No hydrocephalus. No extra-axial fluid collection. No distant disease manifestation. Vascular: Major vessels at the base of the brain show flow.  Skull and upper cervical spine: Left parietal craniotomy as seen previously. Sinuses/Orbits: Clear/normal Other: None IMPRESSION: Favorable evolutionary changes since the study of October. Previously seen enhancement along the margins of the previous resection has markedly involuted. The single exception is that there is a new 9 mm enhancing nodule of tissue adjacent to the midline which could represent a focus of tumor progression within the largely regressing lesion. Some regression of T2 and FLAIR signal as well within the white matter of the posterior brain. Electronically Signed   By: Nelson Chimes M.D.   On: 05/14/2020 20:01   DG Chest Port 1 View  Result Date: 05/15/2020 CLINICAL DATA:  Acute respiratory failure with hypoxemia. EXAM: PORTABLE CHEST 1 VIEW COMPARISON:  May 14, 2020. FINDINGS: Endotracheal tube tip at the level of the inferior clavicular heads. Gastric tube courses below the diaphragm outside the field of view. Similar streaky left basilar opacities, likely atelectasis. No confluent consolidation. No visible pleural effusions or pneumothorax. IMPRESSION: 1. Left basilar atelectasis. 2. Stable support devices. Electronically Signed  By: Margaretha Sheffield MD   On: 05/15/2020 08:07   DG Chest Port 1 View  Result Date: 05/14/2020 CLINICAL DATA:  Post intubation EXAM: PORTABLE CHEST 1 VIEW COMPARISON:  11/22/2019 FINDINGS: Endotracheal tube is 2 cm above the carina. NG tube is in the stomach. Heart is normal size. Left basilar opacity, likely atelectasis. Right lung clear. No effusions or pneumothorax. IMPRESSION: Support devices as above. Left base atelectasis. Electronically Signed   By: Rolm Baptise M.D.   On: 05/14/2020 01:41       Subjective: Patient seen and examined at bedside.  She is awake and answering questions.  Feels okay to go home today.  Denies worsening headache, nausea, vomiting, seizures.  Discharge Exam: Vitals:   05/18/20 2131 05/19/20 0613  BP: (!)  147/81 139/84  Pulse: 88 73  Resp: 16 14  Temp: (!) 97.5 F (36.4 C) 98.6 F (37 C)  SpO2: 96% 97%    General: Pt is alert, awake, not in acute distress.  Poor historian. Cardiovascular: rate controlled, S1/S2 + Respiratory: bilateral decreased breath sounds at bases Abdominal: Soft, NT, ND, bowel sounds + Extremities: no edema, no cyanosis    The results of significant diagnostics from this hospitalization (including imaging, microbiology, ancillary and laboratory) are listed below for reference.     Microbiology: Recent Results (from the past 240 hour(s))  Resp Panel by RT-PCR (Flu A&B, Covid) Nasopharyngeal Swab     Status: None   Collection Time: 05/14/20  1:13 AM   Specimen: Nasopharyngeal Swab; Nasopharyngeal(NP) swabs in vial transport medium  Result Value Ref Range Status   SARS Coronavirus 2 by RT PCR NEGATIVE NEGATIVE Final    Comment: (NOTE) SARS-CoV-2 target nucleic acids are NOT DETECTED.  The SARS-CoV-2 RNA is generally detectable in upper respiratory specimens during the acute phase of infection. The lowest concentration of SARS-CoV-2 viral copies this assay can detect is 138 copies/mL. A negative result does not preclude SARS-Cov-2 infection and should not be used as the sole basis for treatment or other patient management decisions. A negative result may occur with  improper specimen collection/handling, submission of specimen other than nasopharyngeal swab, presence of viral mutation(s) within the areas targeted by this assay, and inadequate number of viral copies(<138 copies/mL). A negative result must be combined with clinical observations, patient history, and epidemiological information. The expected result is Negative.  Fact Sheet for Patients:  EntrepreneurPulse.com.au  Fact Sheet for Healthcare Providers:  IncredibleEmployment.be  This test is no t yet approved or cleared by the [place] FDA and  has  been authorized for detection and/or diagnosis of SARS-CoV-2 by FDA under an Emergency Use Authorization (EUA). This EUA will remain  in effect (meaning this test can be used) for the duration of the COVID-19 declaration under Section 564(b)(1) of the Act, 21 U.S.C.section 360bbb-3(b)(1), unless the authorization is terminated  or revoked sooner.       Influenza A by PCR NEGATIVE NEGATIVE Final   Influenza B by PCR NEGATIVE NEGATIVE Final    Comment: (NOTE) The Xpert Xpress SARS-CoV-2/FLU/RSV plus assay is intended as an aid in the diagnosis of influenza from Nasopharyngeal swab specimens and should not be used as a sole basis for treatment. Nasal washings and aspirates are unacceptable for Xpert Xpress SARS-CoV-2/FLU/RSV testing.  Fact Sheet for Patients: EntrepreneurPulse.com.au  Fact Sheet for Healthcare Providers: IncredibleEmployment.be  This test is not yet approved or cleared by the [place] FDA and has been authorized for detection and/or diagnosis of SARS-CoV-2  by FDA under an Emergency Use Authorization (EUA). This EUA will remain in effect (meaning this test can be used) for the duration of the COVID-19 declaration under Section 564(b)(1) of the Act, 21 U.S.C. section 360bbb-3(b)(1), unless the authorization is terminated or revoked.  Performed at [facility], [address], [place], [place] [address]   MRSA PCR Screening     Status: None   Collection Time: 05/14/20  6:57 AM   Specimen: Nasal Mucosa; Nasopharyngeal  Result Value Ref Range Status   MRSA by PCR NEGATIVE NEGATIVE Final    Comment:        The GeneXpert MRSA Assay (FDA approved for NASAL specimens only), is one component of a comprehensive MRSA colonization surveillance program. It is not intended to diagnose MRSA infection nor to guide or monitor treatment for MRSA infections. Performed at [facility], [place] [address]  [place], [place] [address]      Labs: BNP (last 3 results) No results for input(s): BNP in the last 8760 hours. Basic Metabolic Panel: Recent Labs  Lab 05/14/20 0059 05/14/20 0346 05/14/20 1045 05/14/20 1713 05/15/20 0224 05/15/20 1634 05/18/20 1624 05/19/20 0706  NA 140  --   --   --  140  --  141 139  K 3.3*  --   --   --  4.0  --  4.1 4.3  CL 102  --   --   --  106  --  102 103  CO2 21*  --   --   --  23  --  25 24  GLUCOSE 219*  --   --   --  193*  --  161* 117*  BUN 32*  --   --   --  33*  --  38* 32*  CREATININE 0.95  --   --   --  0.70  --  1.05* 0.72  CALCIUM 8.6*  --   --   --  8.4*  --  9.2 8.9  MG  --    < > 2.3 2.2 2.3 2.4 2.4 2.2  PHOS  --   --  3.7 3.7 3.7 3.2  --   --    < > = values in this interval not displayed.   Liver Function Tests: Recent Labs  Lab 05/14/20 0059 05/15/20 0224 05/18/20 1624 05/19/20 0706  AST 27 16 25 22   ALT 40 29 37 36  ALKPHOS 64 40 59 49  BILITOT 0.6 0.7 0.6 0.8  PROT 6.6 5.2* 6.6 5.8*  ALBUMIN 3.8 3.0* 3.4* 3.2*   No results for input(s): LIPASE, AMYLASE in the last 168 hours. No results for input(s): AMMONIA in the last 168 hours. CBC: Recent Labs  Lab 05/14/20 0059 05/15/20 0224 05/18/20 0545 05/19/20 0859  WBC 4.8 3.0* 3.2* 4.8  NEUTROABS 2.5 2.3  --   --   HGB 16.0* 13.7 15.9* 17.0*  HCT 48.1* 40.1 46.7* 49.0*  MCV 93.6 89.5 90.3 88.1  PLT 55* 51* 84* 113*   Cardiac Enzymes: Recent Labs  Lab 05/14/20 0346  CKTOTAL 46   BNP: Invalid input(s): POCBNP CBG: Recent Labs  Lab 05/17/20 0001 05/17/20 0328 05/17/20 0738 05/17/20 1152 05/17/20 1715  GLUCAP 125* 108* 105* 101* 122*   D-Dimer No results for input(s): DDIMER in the last 72 hours. Hgb A1c No results for input(s): HGBA1C in the last 72 hours. Lipid Profile Recent Labs    05/17/20 0217  TRIG 97   Thyroid function studies  No results for input(s): TSH, T4TOTAL, T3FREE, THYROIDAB in the last 72 hours.  Invalid input(s): FREET3 Anemia  work up No results for input(s): VITAMINB12, FOLATE, FERRITIN, TIBC, IRON, RETICCTPCT in the last 72 hours. Urinalysis    Component Value Date/Time   COLORURINE YELLOW 05/14/2020 0115   APPEARANCEUR HAZY (A) 05/14/2020 0115   LABSPEC 1.018 05/14/2020 0115   PHURINE 5.0 05/14/2020 0115   GLUCOSEU >=500 (A) 05/14/2020 0115   HGBUR NEGATIVE 05/14/2020 0115   BILIRUBINUR NEGATIVE 05/14/2020 0115   KETONESUR NEGATIVE 05/14/2020 0115   PROTEINUR 100 (A) 05/14/2020 0115   NITRITE NEGATIVE 05/14/2020 0115   LEUKOCYTESUR NEGATIVE 05/14/2020 0115   Sepsis Labs Invalid input(s): PROCALCITONIN,  WBC,  LACTICIDVEN Microbiology Recent Results (from the past 240 hour(s))  Resp Panel by RT-PCR (Flu A&B, Covid) Nasopharyngeal Swab     Status: None   Collection Time: 05/14/20  1:13 AM   Specimen: Nasopharyngeal Swab; Nasopharyngeal(NP) swabs in vial transport medium  Result Value Ref Range Status   SARS Coronavirus 2 by RT PCR NEGATIVE NEGATIVE Final    Comment: (NOTE) SARS-CoV-2 target nucleic acids are NOT DETECTED.  The SARS-CoV-2 RNA is generally detectable in upper respiratory specimens during the acute phase of infection. The lowest concentration of SARS-CoV-2 viral copies this assay can detect is 138 copies/mL. A negative result does not preclude SARS-Cov-2 infection and should not be used as the sole basis for treatment or other patient management decisions. A negative result may occur with  improper specimen collection/handling, submission of specimen other than nasopharyngeal swab, presence of viral mutation(s) within the areas targeted by this assay, and inadequate number of viral copies(<138 copies/mL). A negative result must be combined with clinical observations, patient history, and epidemiological information. The expected result is Negative.  Fact Sheet for Patients:  EntrepreneurPulse.com.au  Fact Sheet for Healthcare Providers:   IncredibleEmployment.be  This test is no t yet approved or cleared by the [place] FDA and  has been authorized for detection and/or diagnosis of SARS-CoV-2 by FDA under an Emergency Use Authorization (EUA). This EUA will remain  in effect (meaning this test can be used) for the duration of the COVID-19 declaration under Section 564(b)(1) of the Act, 21 U.S.C.section 360bbb-3(b)(1), unless the authorization is terminated  or revoked sooner.       Influenza A by PCR NEGATIVE NEGATIVE Final   Influenza B by PCR NEGATIVE NEGATIVE Final    Comment: (NOTE) The Xpert Xpress SARS-CoV-2/FLU/RSV plus assay is intended as an aid in the diagnosis of influenza from Nasopharyngeal swab specimens and should not be used as a sole basis for treatment. Nasal washings and aspirates are unacceptable for Xpert Xpress SARS-CoV-2/FLU/RSV testing.  Fact Sheet for Patients: EntrepreneurPulse.com.au  Fact Sheet for Healthcare Providers: IncredibleEmployment.be  This test is not yet approved or cleared by the [place] FDA and has been authorized for detection and/or diagnosis of SARS-CoV-2 by FDA under an Emergency Use Authorization (EUA). This EUA will remain in effect (meaning this test can be used) for the duration of the COVID-19 declaration under Section 564(b)(1) of the Act, 21 U.S.C. section 360bbb-3(b)(1), unless the authorization is terminated or revoked.  Performed at [facility], [address], [place], [place] [address]   MRSA PCR Screening     Status: None   Collection Time: 05/14/20  6:57 AM   Specimen: Nasal Mucosa; Nasopharyngeal  Result Value Ref Range Status   MRSA by PCR NEGATIVE NEGATIVE Final    Comment:  The GeneXpert MRSA Assay (FDA approved for NASAL specimens only), is one component of a comprehensive MRSA colonization surveillance program. It is not intended to diagnose MRSA infection nor to guide  or monitor treatment for MRSA infections. Performed at [facility], [place] [address] [place], [place] [address]      Time coordinating discharge: 35 minutes  SIGNED:   Aline August, MD  [facility] 05/19/2020, 10:45 AM

## 2020-05-19 NOTE — Discharge Instructions (Signed)
Acute Respiratory Failure, Adult ° °Acute respiratory failure occurs when there is not enough oxygen passing from your lungs to your body. When this happens, your lungs have trouble removing carbon dioxide from the blood. This causes your blood oxygen level to drop too low as carbon dioxide builds up. °Acute respiratory failure is a medical emergency. It can develop quickly, but it is temporary if treated promptly. Your lung capacity, or how much air your lungs can hold, may improve with time, exercise, and treatment. °What are the causes? °There are many possible causes of acute respiratory failure, including: °· Lung injury. °· Chest injury or damage to the ribs or tissues near the lungs. °· Lung conditions that affect the flow of air and blood into and out of the lungs, such as pneumonia, acute respiratory distress syndrome, and cystic fibrosis. °· Medical conditions, such as strokes or spinal cord injuries, that affect the muscles and nerves that control breathing. °· Blood infection (sepsis). °· Inflammation of the pancreas (pancreatitis). °· A blood clot in the lungs (pulmonary embolism). °· A large-volume blood transfusion. °· Burns. °· Near-drowning. °· Seizure. °· Smoke inhalation. °· Reaction to medicines. °· Alcohol or drug overdose. °What increases the risk? °This condition is more likely to develop in people who have: °· A blocked airway. °· Asthma. °· A condition or disease that damages or weakens the muscles, nerves, bones, or tissues that are involved in breathing. °· A serious infection. °· A health problem that blocks the unconscious reflex that is involved in breathing, such as hypothyroidism or sleep apnea. °· A lung injury or trauma. °What are the signs or symptoms? °Trouble breathing is the main symptom of acute respiratory failure. Symptoms may also include: °· Rapid breathing. °· Restlessness or anxiety. °· Skin, lips, or fingernails that appear blue (cyanosis). °· Rapid heart  rate. °· Abnormal heart rhythms (arrhythmias). °· Confusion or changes in behavior. °· Tiredness or loss of energy. °· Feeling sleepy or having a loss of consciousness. °How is this diagnosed? °Your health care provider can diagnose acute respiratory failure with a medical history and physical exam. During the exam, your health care provider will listen to your heart and check for crackling or wheezing sounds in your lungs. Your may also have tests to confirm the diagnosis and determine what is causing respiratory failure. These tests may include: °· Measuring the amount of oxygen in your blood (pulse oximetry). The measurement comes from a small device that is placed on your finger, earlobe, or toe. °· Other blood tests to measure blood gases and to look for signs of infection. °· Sampling your cerebral spinal fluid or tracheal fluid to check for infections. °· Chest X-[place] to look for fluid in spaces that should be filled with air. °· Electrocardiogram (ECG) to look at the heart's electrical activity. °How is this treated? °Treatment for this condition usually takes places in a hospital intensive care unit (ICU). Treatment depends on what is causing the condition. It may include one or more treatments until your symptoms improve. Treatment may include: °· Supplemental oxygen. Extra oxygen is given through a tube in the nose, a face mask, or a hood. °· A device such as a continuous positive airway pressure (CPAP) or bi-level positive airway pressure (BiPAP or BPAP) machine. This treatment uses mild air pressure to keep the airways open. A mask or other device will be placed over your nose or mouth. A tube that is connected to a motor will deliver oxygen through the   mask.  Ventilator. This treatment helps move air into and out of the lungs. This may be done with a bag and mask or a machine. For this treatment, a tube is placed in your windpipe (trachea) so air and oxygen can flow to the lungs.  Extracorporeal  membrane oxygenation (ECMO). This treatment temporarily takes over the function of the heart and lungs, supplying oxygen and removing carbon dioxide. ECMO gives the lungs a chance to recover. It may be used if a ventilator is not effective.  Tracheostomy. This is a procedure that creates a hole in the neck to insert a breathing tube.  Receiving fluids and medicines.  Rocking the bed to help breathing. Follow these instructions at home:  Take over-the-counter and prescription medicines only as told by your health care provider.  Return to normal activities as told by your health care provider. Ask your health care provider what activities are safe for you.  Keep all follow-up visits as told by your health care provider. This is important. How is this prevented? Treating infections and medical conditions that may lead to acute respiratory failure can help prevent the condition from developing. Contact a health care provider if:  You have a fever.  Your symptoms do not improve or they get worse. Get help right away if:  You are having trouble breathing.  You lose consciousness.  Your have cyanosis or turn blue.  You develop a rapid heart rate.  You are confused. These symptoms may represent a serious problem that is an emergency. Do not wait to see if the symptoms will go away. Get medical help right away. Call your local emergency services (911 in the [place] Do not drive yourself to the hospital. This information is not intended to replace advice given to you by your health care provider. Make sure you discuss any questions you have with your health care provider. Document Revised: 04/27/2017 Document Reviewed: 12/01/2015 Elsevier Patient Education  [place].   Managing Non-Epileptic Seizures, Adult Epileptic seizures are caused by abnormal electrical activity in the brain, but not all seizures are caused by epilepsy. Seizures that are not caused by epilepsy are called  non-epileptic seizures, and there are two types:  Physiologic non-epileptic seizure. This is also called provoked seizure or organic seizure. This type of seizure stops when the cause goes away or is treated. Possible causes include: ? High fever. ? High or low blood sugar (glucose). ? Brain injury. ? Brain infection.  Psychogenic non-epileptic seizure (PNES). This can be caused by a mental disturbance (psychological distress), not by abnormal brain activity or brain injury. This may look similar to other types of seizures. A PNES may be called an "event" or "attack" instead of a seizure. Possible causes include: ? Stress. ? Major life events, such as divorce or death of a loved one. ? Post-traumatic stress disorder (PTSD). ? Physical or sexual abuse. ? Mental health disorders, including anxiety and depression. General treatment recommendations Physiologic non-epileptic seizure  If you have physiologic non-epileptic seizures, your health care provider will treat the cause. These seizures are not likely to return, and they do not need further treatment. PNES  Your health care provider may suspect PNES if: ? You have seizures that keep coming back (recurring) and do not respond to seizure medicines. ? You do not show any abnormal brain activity during electrical brain activity testing (electroencephalogram, or EEG).  It is very important to understand that PNES is a real illness. It does not mean  that the seizure is fake. It just means that the cause is different. PNES can be treated.  You may be referred to a mental health specialist (psychiatrist). This is because PNES is a mental health disorder. Mental health treatment may include: ? Talk therapy (cognitive behavioral therapy, or CBT). This is the most effective treatment. Through CBT, you will learn to identify and manage the psychological distress that leads to seizures. ? Stress reduction and relaxation techniques. ? Family  therapy. ? Medicines to treat depression or anxiety.  In many cases, knowing that seizures are not caused by epilepsy reduces or stops seizures. How to manage lifestyle changes Managing stress     Certain types of counseling can be very helpful for managing stress. A mental health professional can assess what other treatments may also help you, such as:  Cognitive behavioral therapy.  Medicine to treat depression or anxiety.  Biofeedback. This uses signals from your body (physiological responses) to help you learn to regulate anxiety.  Meditation.  Yoga. Consider self-care strategies to lower stress levels, such as:  Talking with a trusted friend or family member about your thoughts and feelings.  Muscle relaxation and breathing exercises.  Trying activities to relieve stress, such as: ? Deep breathing. ? Listening to music. ? Exercising or taking a walk. ? Writing in a journal. ? Company secretary.  Relationships Make sure family members, friends, and co-workers are trained in how to help you if you have a seizure. If you have a seizure, people near you should:  Lay you on the ground to prevent a fall.  Place a pillow or piece of clothing under your head.  Loosen any tight clothing, especially around your neck.  Turn you onto your side. This helps keep your airway clear if you vomit. Make sure people do not try to hold you down, keep you still, or put anything in your mouth during a seizure. Follow these instructions at home: [place] may be prescribed to treat depression or anxiety that causes non-epileptic seizures. Avoid using alcohol and other substances that may prevent your medicines from working properly (may interact). It is also important to:  Talk with your pharmacist or health care provider about all the medicines that you take, their possible side effects, and what medicines are safe to take together.  Make it your goal to take part in all  treatment decisions (shared decision-making). Ask about possible side effects of medicines that your health care provider recommends, and tell him or her how you feel about having those side effects. It is best if shared decision-making with your health care provider is part of your total treatment plan.  Take over-the-counter and prescription medicines only as told by your health care provider. General instructions  Ask your health care provider if it is safe for you to drive.  Return to your normal activities as told by your health care provider. Ask your health care provider what activities are safe for you.  Keep all follow-up visits as told by your health care providers. This is important. Where to find support You can get support for managing non-epileptic seizures from support groups, either online or in-person. Your health care provider may be able to recommend a support group in your area. Where to find more information  Epilepsy Foundation: www.epilepsy.com  American Epilepsy Society: www.aesnet.org Contact a health care provider if:  Your seizures change or become more frequent.  You continue to have seizures after treatment. Get help right away if:  You injure yourself during a seizure.  You have: ? One seizure after another. ? Trouble recovering from a seizure. ? Chest pain or trouble breathing. ? A seizure that lasts longer than 5 minutes. These symptoms may represent a serious problem that is an emergency. Do not wait to see if the symptoms will go away. Get medical help right away. Call your local emergency services (911 in the [place] Do not drive yourself to the hospital. Summary  Seizures that are not caused by epilepsy are called non-epileptic seizures. The two types of non-epileptic seizures are physiologic non-epileptic seizure and psychogenic non-epileptic seizure (PNES).  PNES is a treatable mental health disorder that is caused by psychological distress. It  is very important to work with your mental health provider to find a treatment that works for you.  Learning to manage stress and anxiety is an important part of PNES treatment.  Make sure family members, friends, and co-workers are trained in how to help you if you have a seizure. If you have a seizure, they should lay you on the ground to prevent a fall, protect your head and neck, and turn you onto your side. This information is not intended to replace advice given to you by your health care provider. Make sure you discuss any questions you have with your health care provider. Document Revised: 05/27/2018 Document Reviewed: 01/09/2017 Elsevier Patient Education  [place].   Glioblastoma  Glioblastoma, also called Grade IV astrocytoma, is a type of brain cancer. Glioblastoma tumors are made up of an overgrowth of normal brain cells. This type of cancer can grow quickly. What are the causes? A tumor is formed when normal brain cells grow into a mass of tissue. What causes normal brain cells to grow into a mass of tissue is not known. What increases the risk? The following factors may make you more likely to develop this condition:  Radiation exposure.  Family history of cancer syndrome, such as Li-Fraumeni syndrome or [place] syndrome.  Age. People between the ages of 49 and 58 are more likely to develop this tumor. What are the signs or symptoms? Symptoms of this condition may depend on the size and location of the tumor. Symptoms may include:  Headache, which may be worse in the morning.  Nausea and vomiting.  Vision changes.  Seizures.  Not being able to walk.  Weakness or numbness on one side of the body or in an arm or leg.  Mood changes.  Problems with memory or thinking.  Drowsiness. Symptoms are most commonly caused by increased pressure in the brain. How is this diagnosed? This condition is diagnosed based on a medical history and a physical exam. Brain  imaging tests will also be done, such as a CT scan or MRI. A sample of the tumor will be taken and studied in a laboratory (biopsy) to confirm the diagnosis. How is this treated? There are several treatment options for this condition. Often, a person will have more than one type of treatment. Treatment may include:  Surgery to remove as much of the tumor as possible.  High-energy rays (radiation therapy) to help shrink or kill the tumor.  Chemotherapy to shrink or kill the tumor. Because normal cells may also be killed, chemotherapy causes many side effects.  Targeted therapy. This uses substances that damage or kill cancer cells without affecting normal cells.  Steroid medicine to decrease brain swelling and improve symptoms.  New treatments through clinical trials. Follow these instructions at  home:  Take over-the-counter and prescription medicines only as told by your health care provider.  Consider joining a support group.  Keep all follow-up visits as told by your health care provider. This is important. Where to find more information  [place]: https://www.cancer.gov  American Cancer Society: http://www.cancer.org Contact a health care provider if:  Any of your symptoms come back.  You have diarrhea, vomiting, or abdominal pain.  You cannot eat or drink what you need.  You feel weaker and more tired than usual.  You lose weight without trying. Get help right away if:  Your diarrhea, vomiting, or abdominal pain does not go away.  You have new symptoms, such as vision problems or trouble walking.  You have a seizure.  You have bleeding that does not stop.  You have trouble breathing.  You have a fever. Summary  Glioblastoma, also called Grade IV astrocytoma, is a type of brain cancer. Glioblastoma tumors are made up of an overgrowth of normal brain cells. This type of cancer can grow quickly.  This condition is diagnosed based on a medical  history and a physical exam. Brain imaging tests will also be done, such as a CT scan or MRI.  A sample of the tumor will be taken and studied in a laboratory (biopsy) to confirm the diagnosis.  Treatment may include surgery to remove the tumor as well as radiation therapy and chemotherapy. This information is not intended to replace advice given to you by your health care provider. Make sure you discuss any questions you have with your health care provider. Document Revised: 04/27/2017 Document Reviewed: 06/13/2016 Elsevier Patient Education  [place].

## 2020-05-19 NOTE — Consult Note (Signed)
[name] Nurse Consult Note: Patient receiving care in [place] Consult completed remotely after review of chart and [place] with bedside RN [facility]. Bedside RN not aware of any changes in the wound. This patient was assessed by my colleague Dawn E. On 05/14/20 Reason for Consult: Sacral wound Dressing procedure/placement/frequency: Will continue previous orders: Apply moist gauze 2X2cm packing to sacrum wounds Q day, then cover with foam dressing. (Change foam dressing Q 3 days or PRN soiling.) Please re-consult if further assistance is needed.  Thomasene Mohair Tamala Julian, MSN, RN, [place], [place], [facility] Wound Treatment Associate Pager [phone]

## 2020-05-19 NOTE — TOC Initial Note (Signed)
Transition of Care [facility]) - Initial/Assessment Note    Patient Details  Name: Wanda Ross MRN: [id number] Date of Birth: [birth year]  [facility] CM/SW Contact:    Bartholome Bill, RN Phone Number: 05/19/2020, 10:00 AM  Clinical Narrative:                 Spoke with pt at bedside for dc planning. Pt defers planning to her children. Spoke with daughter [name] via phone to offer choice for home health services. [facility] chosen and [facility] contacted for referral.  Expected Discharge Plan: Home w Home Health Services Barriers to Discharge: No Barriers Identified   Patient Goals and CMS Choice Patient states their goals for this hospitalization and ongoing recovery are:: To get  home CMS Medicare.gov Compare Post Acute Care list provided to:: Patient Represenative (must comment) (Daughter [name]) Choice offered to / list presented to : Adult Children  Expected Discharge Plan and Services Expected Discharge Plan: Home w Home Health Services   Discharge Planning Services: CM Consult Post Acute Care Choice: Home Health Living arrangements for the past 2 months: Skilled Nursing Facility Expected Discharge Date: 05/19/20                         HH Arranged: PT HH Agency: [facility] Date [facility] Agency Contacted: 05/19/20 Time HH Agency Contacted: [phone] Representative spoke with at [facility] Agency: Kandee Keen  Prior Living Arrangements/Services Living arrangements for the past 2 months: Skilled Nursing Facility Lives with:: Self Patient language and need for interpreter reviewed:: Yes Do you feel safe going back to the place where you live?: Yes      Need for Family Participation in Patient Care: Yes (Comment) Care giver support system in place?: Yes (comment)   Criminal Activity/Legal Involvement Pertinent to Current Situation/Hospitalization: No - Comment as needed  Activities of Daily Living Home Assistive Devices/Equipment: Dentures (specify type),Eyeglasses,Other  (Comment) (step in shower) ADL Screening (condition at time of admission) Patient's cognitive ability adequate to safely complete daily activities?: No (patient currently intubated) Is the patient deaf or have difficulty hearing?: No Does the patient have difficulty seeing, even when wearing glasses/contacts?: No Does the patient have difficulty concentrating, remembering, or making decisions?: Yes Patient able to express need for assistance with ADLs?: No Does the patient have difficulty dressing or bathing?: Yes Independently performs ADLs?: No Communication: Dependent (patient currently intubated) Is this a change from baseline?: Change from baseline, expected to last >3 days Dressing (OT): Dependent Is this a change from baseline?: Change from baseline, expected to last >3 days Grooming: Dependent Is this a change from baseline?: Change from baseline, expected to last >3 days Feeding: Dependent Is this a change from baseline?: Change from baseline, expected to last >3 days Bathing: Dependent Is this a change from baseline?: Change from baseline, expected to last >3 days Toileting: Dependent Is this a change from baseline?: Change from baseline, expected to last >3days In/Out Bed: Dependent Is this a change from baseline?: Change from baseline, expected to last >3 days Walks in Home: Dependent Is this a change from baseline?: Change from baseline, expected to last >3 days Does the patient have difficulty walking or climbing stairs?: Yes (secondary to weakness) Weakness of Legs: Both Weakness of Arms/Hands: Both  Permission Sought/Granted Permission sought to share information with : Facility Industrial/product designer granted to share information with : Yes, Verbal Permission Granted     Permission granted to share info  w [facility]        Emotional Assessment Appearance:: Appears stated age Attitude/Demeanor/Rapport: Engaged Affect (typically observed):  Appropriate Orientation: : Oriented to Self,Oriented to Place,Oriented to  Time,Oriented to Situation Alcohol / Substance Use: Not Applicable    Admission diagnosis:  Respiratory arrest ([place]) [R09.2] Hypokalemia [E87.6] Seizure ([place]) [R56.9] Glioblastoma ([place]) [C71.9] Acute respiratory failure with hypoxia ([place]) [J96.01] Patient Active Problem List   Diagnosis Date Noted  . Acute respiratory failure with hypoxemia ([place]) 05/14/2020  . Seizures ([place]) 05/14/2020  . Thrombocytopenia ([place]) 05/14/2020  . Lactic acidosis 05/14/2020  . Hypokalemia 05/14/2020  . Hyperglycemia 05/14/2020  . Pressure injury of skin 05/14/2020  . Goals of care, counseling/discussion 12/15/2019  . Glioblastoma ([place])   . Hypoalbuminemia due to protein-calorie malnutrition ([place])   . Transaminitis   . Benign essential HTN   . Prediabetes   . Seizure prophylaxis   . Glioma ([place]) 12/02/2019  . Right sided weakness   . Dyslipidemia   . Essential hypertension   . Steroid-induced hyperglycemia   . Leucocytosis   . Brain mass 11/22/2019  . 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 Abnormal electrocardiogram (ECG) (EKG) 12/31/2017  . White coat syndrome with diagnosis of hypertension 12/31/2017  . Glaucoma 12/26/2013  . Vitamin D deficiency 05/27/2009  . IMPAIRED GLUCOSE TOLERANCE 05/27/2009  . OSTEOPENIA 05/27/2009  . Multiple thyroid nodules 11/06/2007  . Hyperlipidemia 11/06/2007  . 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 [place] [address] Phone: [phone] Fax: [phone]  [place], [place] - [address] [place] [address] Phone: [phone] Fax: [phone]     Social Determinants of Health (SDOH) Interventions    Readmission Risk Interventions No flowsheet data found.

## 2020-05-19 NOTE — Care Management Important Message (Signed)
Important Message  Patient Details IM Letter given to the Patient. Name: Wanda Ross MRN: [id number] Date of Birth: 1950/07/24   Medicare Important Message Given:  Yes     Kerin Salen 05/19/2020, 11:13 AM

## 2020-05-20 ENCOUNTER — Inpatient Hospital Stay: Payer: Medicare PPO | Attending: Internal Medicine | Admitting: Internal Medicine

## 2020-05-20 ENCOUNTER — Inpatient Hospital Stay: Payer: Medicare PPO

## 2020-05-20 ENCOUNTER — Other Ambulatory Visit: Payer: Self-pay

## 2020-05-20 ENCOUNTER — Telehealth: Payer: Self-pay

## 2020-05-20 VITALS — BP 148/83 | HR 82 | Temp 98.2°F | Resp 17 | Wt 150.0 lb

## 2020-05-20 DIAGNOSIS — D696 Thrombocytopenia, unspecified: Secondary | ICD-10-CM | POA: Diagnosis present

## 2020-05-20 DIAGNOSIS — G9389 Other specified disorders of brain: Secondary | ICD-10-CM | POA: Diagnosis not present

## 2020-05-20 DIAGNOSIS — E782 Mixed hyperlipidemia: Secondary | ICD-10-CM | POA: Diagnosis not present

## 2020-05-20 DIAGNOSIS — R739 Hyperglycemia, unspecified: Secondary | ICD-10-CM | POA: Diagnosis not present

## 2020-05-20 DIAGNOSIS — B002 Herpesviral gingivostomatitis and pharyngotonsillitis: Secondary | ICD-10-CM | POA: Diagnosis present

## 2020-05-20 DIAGNOSIS — R609 Edema, unspecified: Secondary | ICD-10-CM | POA: Diagnosis present

## 2020-05-20 DIAGNOSIS — B029 Zoster without complications: Secondary | ICD-10-CM | POA: Diagnosis not present

## 2020-05-20 DIAGNOSIS — C719 Malignant neoplasm of brain, unspecified: Secondary | ICD-10-CM

## 2020-05-20 DIAGNOSIS — Z20822 Contact with and (suspected) exposure to covid-19: Secondary | ICD-10-CM | POA: Diagnosis present

## 2020-05-20 DIAGNOSIS — R7401 Elevation of levels of liver transaminase levels: Secondary | ICD-10-CM | POA: Diagnosis present

## 2020-05-20 DIAGNOSIS — T380X5A Adverse effect of glucocorticoids and synthetic analogues, initial encounter: Secondary | ICD-10-CM | POA: Diagnosis present

## 2020-05-20 DIAGNOSIS — Z8249 Family history of ischemic heart disease and other diseases of the circulatory system: Secondary | ICD-10-CM | POA: Diagnosis not present

## 2020-05-20 DIAGNOSIS — E785 Hyperlipidemia, unspecified: Secondary | ICD-10-CM | POA: Diagnosis present

## 2020-05-20 DIAGNOSIS — R21 Rash and other nonspecific skin eruption: Secondary | ICD-10-CM | POA: Diagnosis present

## 2020-05-20 DIAGNOSIS — D849 Immunodeficiency, unspecified: Secondary | ICD-10-CM | POA: Diagnosis present

## 2020-05-20 DIAGNOSIS — Z7952 Long term (current) use of systemic steroids: Secondary | ICD-10-CM | POA: Diagnosis not present

## 2020-05-20 DIAGNOSIS — H409 Unspecified glaucoma: Secondary | ICD-10-CM | POA: Diagnosis present

## 2020-05-20 DIAGNOSIS — R569 Unspecified convulsions: Secondary | ICD-10-CM

## 2020-05-20 DIAGNOSIS — E876 Hypokalemia: Secondary | ICD-10-CM | POA: Diagnosis present

## 2020-05-20 DIAGNOSIS — Z79899 Other long term (current) drug therapy: Secondary | ICD-10-CM | POA: Diagnosis not present

## 2020-05-20 DIAGNOSIS — R531 Weakness: Secondary | ICD-10-CM | POA: Diagnosis not present

## 2020-05-20 DIAGNOSIS — D126 Benign neoplasm of colon, unspecified: Secondary | ICD-10-CM | POA: Diagnosis not present

## 2020-05-20 DIAGNOSIS — I10 Essential (primary) hypertension: Secondary | ICD-10-CM | POA: Diagnosis present

## 2020-05-20 DIAGNOSIS — E042 Nontoxic multinodular goiter: Secondary | ICD-10-CM | POA: Diagnosis not present

## 2020-05-20 LAB — CBC WITH DIFFERENTIAL (CANCER CENTER ONLY)
Abs Immature Granulocytes: 0.1 10*3/uL — ABNORMAL HIGH (ref 0.00–0.07)
Band Neutrophils: 12 %
Basophils Absolute: 0 10*3/uL (ref 0.0–0.1)
Basophils Relative: 0 %
Eosinophils Absolute: 0 10*3/uL (ref 0.0–0.5)
Eosinophils Relative: 0 %
HCT: 44 % (ref 36.0–46.0)
Hemoglobin: 15.4 g/dL — ABNORMAL HIGH (ref 12.0–15.0)
Lymphocytes Relative: 9 %
Lymphs Abs: 0.5 10*3/uL — ABNORMAL LOW (ref 0.7–4.0)
MCH: 30.3 pg (ref 26.0–34.0)
MCHC: 35 g/dL (ref 30.0–36.0)
MCV: 86.6 fL (ref 80.0–100.0)
Metamyelocytes Relative: 2 %
Monocytes Absolute: 0.5 10*3/uL (ref 0.1–1.0)
Monocytes Relative: 8 %
Neutro Abs: 4.6 10*3/uL (ref 1.7–7.7)
Neutrophils Relative %: 69 %
Platelet Count: 114 10*3/uL — ABNORMAL LOW (ref 150–400)
RBC: 5.08 MIL/uL (ref 3.87–5.11)
RDW: 14.1 % (ref 11.5–15.5)
WBC Count: 5.7 10*3/uL (ref 4.0–10.5)
nRBC: 0.4 % — ABNORMAL HIGH (ref 0.0–0.2)

## 2020-05-20 LAB — CMP (CANCER CENTER ONLY)
ALT: 46 U/L — ABNORMAL HIGH (ref 0–44)
AST: 17 U/L (ref 15–41)
Albumin: 3 g/dL — ABNORMAL LOW (ref 3.5–5.0)
Alkaline Phosphatase: 65 U/L (ref 38–126)
Anion gap: 10 (ref 5–15)
BUN: 23 mg/dL (ref 8–23)
CO2: 25 mmol/L (ref 22–32)
Calcium: 9.1 mg/dL (ref 8.9–10.3)
Chloride: 105 mmol/L (ref 98–111)
Creatinine: 0.77 mg/dL (ref 0.44–1.00)
GFR, Estimated: >60 mL/min (ref >60)
Glucose, Bld: 116 mg/dL — ABNORMAL HIGH (ref 70–99)
Potassium: 4.2 mmol/L (ref 3.5–5.1)
Sodium: 140 mmol/L (ref 135–145)
Total Bilirubin: 0.6 mg/dL (ref 0.3–1.2)
Total Protein: 6.2 g/dL — ABNORMAL LOW (ref 6.5–8.1)

## 2020-05-20 NOTE — Telephone Encounter (Signed)
Transition Care Management Follow-up Telephone Call  Date of discharge and from where: 05/19/20 [facility]  How have you been since you were released from the hospital? Doing good  Any questions or concerns? Yes  Items Reviewed:  Did the pt receive and understand the discharge instructions provided? Yes   Medications obtained and verified? Yes   Other? No   Any new allergies since your discharge? No   Dietary orders reviewed? No  Do you have support at home? Yes    Functional Questionnaire: (I = Independent and D = Dependent) ADLs: d  Bathing/Dressing- d  Meal Prep- d  Eating- i  Maintaining continence- i  Transferring/Ambulation- i  Managing Meds- d   Follow up appointments reviewed:   PCP Hospital f/u appt confirmed? No  will give to someone to schedule visit  Specialist Hospital f/u appt confirmed? Yes  Are transportation arrangements needed? No   If their condition worsens, is the pt aware to call PCP or go to the Emergency Dept.? Yes  Was the patient provided with contact information for the PCP's office or ED? Yes  Was to pt encouraged to call back with questions or concerns? Yes

## 2020-05-20 NOTE — Progress Notes (Signed)
[facility] [place], [place] [address] [phone]   Interval Evaluation  Date of Service: 05/20/20 Patient Name: Wanda Ross Patient MRN: [id number] Patient DOB: [birth year] Provider: Ventura Sellers, MD  Identifying Statement:  Wanda Ross is a 69 y.o. female with left frontal glioblastoma   Oncologic History: 11/26/19: Craniotomy, resection of L parietal mass with Dr. Marcello Moores; path is GBM 02/09/20: Completes IMRT and concurrent Temozolomide 22m/m2  Biomarkers:  MGMT Unknown.  IDH 1/2 Wild type.  EGFR Unknown  TERT Unknown   Interval History:  Wanda NEWMANNpresents today for follow up after recent hospitalization for seizures.  She is nearly at baseline now, seizure free since discharge and going back to initial presentation on 12/17.  No new or progressive deficits, denies headaches.  Fatigue is stable from prior.  MRI was obtained while inpatient.  H+P (12/15/19) Patient presented to medical attention in late June with several weeks of progressive symptoms, mainly characterized by gait impairment, confusion and disorientation.  CNS imaging was obtained which demonstrated 4-5cm mass in the left parietal region, consistent with likely brain brain tumor.  She underwent craniotomy and resection with Dr. TMarcello Mooreson 11/26/19.  Following surgery she developed some right sided weakness, but this recovered to essentially full strength following nearly 2 weeks course of inpatient rehab.  Currently, she is living at home with her sister, although prior to this she was living alone.  She maintains full functional status, no limitations at present time.  Retired middle school principal.  Medications: Current Outpatient Medications on File Prior to Visit  Medication Sig Dispense Refill  . acetaminophen (TYLENOL) 325 MG tablet Take 2 tablets (650 mg total) by mouth every 4 (four) hours as needed for mild pain (temp >  100.5).    .Marland Kitchenalendronate (FOSAMAX) 70 MG tablet Take 1 tablet by mouth every Sunday.    .Marland KitchenamLODipine (NORVASC) 5 MG tablet Take 1 tablet (5 mg total) by mouth daily. 90 tablet 1  . Calcium Carbonate-Vit D-Min (CALCIUM 1200) 1200-1000 MG-UNIT CHEW Chew 1,200 mg by mouth daily. 30 tablet 1  . cloNIDine (CATAPRES) 0.3 MG tablet TAKE 1 TABLET BY MOUTH EVERYDAY AT BEDTIME (Patient taking differently: Take 0.3 mg by mouth at bedtime.) 90 tablet 0  . Cyanocobalamin 2500 MCG TABS Take 2,500 mcg by mouth daily. (Patient taking differently: Take 2,500 mcg by mouth at bedtime.) 30 tablet 0  . dexamethasone (DECADRON) 4 MG tablet Take 1 tablet (4 mg total) by mouth 2 (two) times daily. 60 tablet 0  . docusate sodium (COLACE) 100 MG capsule Take 1 capsule (100 mg total) by mouth 2 (two) times daily as needed for mild constipation.    . dorzolamide-timolol (COSOPT) 22.3-6.8 MG/ML ophthalmic solution Place 1 drop into the right eye 2 (two) times daily.     .Marland KitchenlevETIRAcetam (KEPPRA) 1000 MG tablet Take 1 tablet (1,000 mg total) by mouth 2 (two) times daily. 60 tablet 0  . Multiple Vitamins-Minerals (CENTRUM SILVER ULTRA WOMENS) TABS Take 1 tablet by mouth daily.    . pravastatin (PRAVACHOL) 10 MG tablet Take 1 tablet (10 mg total) by mouth at bedtime.    . ondansetron (ZOFRAN) 8 MG tablet Take 1 tablet (8 mg total) by mouth 2 (two) times daily as needed (nausea and vomiting). May take 30-60 minutes prior to Temodar administration if nausea/vomiting occurs. (Patient not taking: Reported on 05/20/2020) 30 tablet 1   No current facility-administered  medications on file prior to visit.    Allergies: No Known Allergies Past Medical History:  Past Medical History:  Diagnosis Date  . Glaucoma   . Hyperlipidemia 2010  . Hypertension 1990  . IGT (impaired glucose tolerance) 05/11/2010  . Multinodular goiter 2010   normal function   Past Surgical History:  Past Surgical History:  Procedure Laterality Date  .  APPLICATION OF CRANIAL NAVIGATION Left 11/26/2019   Procedure: APPLICATION OF CRANIAL NAVIGATION;  Surgeon: [name], MD;  Location: [facility];  Service: Neurosurgery;  Laterality: Left;  APPLICATION OF CRANIAL NAVIGATION  . COLONOSCOPY N/A 06/16/2016   Procedure: COLONOSCOPY;  Surgeon: Danie Binder, MD;  [facility];  Service: Endoscopy;  Laterality: N/A;  9:30 AM  . COLONOSCOPY N/A 10/22/2019   Procedure: COLONOSCOPY;  Surgeon: Daneil Dolin, MD;  [facility];  Service: Endoscopy;  Laterality: N/A;  9:30  . CRANIOTOMY Left 11/26/2019   Procedure: LEFT PARIETAL CRANIOTOMY FOR RESECTION OF TUMOR;  Surgeon: [name], MD;  Location: [facility];  Service: Neurosurgery;  Laterality: Left;  LEFT PARIETAL CRANIOTOMY FOR RESECTION OF TUMOR  . EYE SURGERY  2017   3 on left eye 1 on right eye for glaucoma by Dr. Venetia Maxon   . EYE SURGERY  2018   Cataract Surgery by Dr. Arlina Robes   . OPERATIVE ULTRASOUND N/A 11/26/2019   Procedure: OPERATIVE ULTRASOUND;  Surgeon: [name], MD;  Location: [facility];  Service: Neurosurgery;  Laterality: N/A;  OPERATIVE ULTRASOUND   Social History:  Social History   Socioeconomic History  . Marital status: Widowed    Spouse name: Not on file  . Number of children: 3  . Years of education: Not on file  . Highest education level: Not on file  Occupational History  . Occupation: employed   Tobacco Use  . Smoking status: Never Smoker  . Smokeless tobacco: Never Used  Vaping Use  . Vaping Use: Never used  Substance and Sexual Activity  . Alcohol use: No  . Drug use: No  . Sexual activity: Never    Birth control/protection: Post-menopausal  Other Topics Concern  . Not on file  Social History Narrative  . Not on file   Social Determinants of Health   Financial Resource Strain: Not on file  Food Insecurity: Not on file  Transportation Needs: Not on file  Physical Activity: Not on file  Stress: Not on file  Social  Connections: Not on file  Intimate Partner Violence: Not on file   Family History:  Family History  Problem Relation Age of Onset  . Dementia Father   . Dementia Mother   . Diabetes Mother   . Hypertension Mother   . Breast cancer Sister   . Hypertension Sister   . Diabetes Brother     Review of Systems: Constitutional: Doesn't report fevers, chills or abnormal weight loss Eyes: Doesn't report blurriness of vision Ears, nose, mouth, throat, and face: Doesn't report sore throat Respiratory: Doesn't report cough, dyspnea or wheezes Cardiovascular: Doesn't report palpitation, chest discomfort  Gastrointestinal:  Doesn't report nausea, constipation, diarrhea GU: Doesn't report incontinence Skin: Doesn't report skin rashes Neurological: Per HPI Musculoskeletal: Doesn't report joint pain Behavioral/Psych: Doesn't report anxiety  Physical Exam: Vitals:   05/20/20 1031  BP: (!) 148/83  Pulse: 82  Resp: 17  Temp: 98.2 F (36.8 C)  SpO2: 99%   KPS: 80. General: Alert, cooperative, pleasant, in no acute distress Head: Normal EENT:  No conjunctival injection or scleral icterus.  Lungs: Resp effort normal Cardiac: Regular rate Abdomen: Non-distended abdomen Skin: No rashes cyanosis or petechiae. Extremities: No clubbing or edema  Neurologic Exam: Mental Status: Awake, alert, attentive to examiner. Oriented to self and environment. Language is fluent with intact comprehension.  Cranial Nerves: Visual acuity is grossly normal. Visual fields are full. Extra-ocular movements intact. No ptosis. Face is symmetric Motor: Tone and bulk are normal. Power is full in both arms and legs. Reflexes are symmetric, no pathologic reflexes present.  Sensory: Intact to light touch Gait: Modestly hemiparetic.   Labs: I have reviewed the data as listed    Component Value Date/Time   NA 139 05/19/2020 0706   K 4.3 05/19/2020 0706   CL 103 05/19/2020 0706   CO2 24 05/19/2020 0706   GLUCOSE  117 (H) 05/19/2020 0706   BUN 32 (H) 05/19/2020 0706   CREATININE 0.72 05/19/2020 0706   CREATININE 0.90 04/12/2020 1002   CREATININE 0.86 05/21/2019 0754   CALCIUM 8.9 05/19/2020 0706   PROT 5.8 (L) 05/19/2020 0706   ALBUMIN 3.2 (L) 05/19/2020 0706   AST 22 05/19/2020 0706   AST 15 04/12/2020 1002   ALT 36 05/19/2020 0706   ALT 18 04/12/2020 1002   ALKPHOS 49 05/19/2020 0706   BILITOT 0.8 05/19/2020 0706   BILITOT 0.7 04/12/2020 1002   GFRNONAA >60 05/19/2020 0706   GFRNONAA >60 04/12/2020 1002   GFRNONAA 68 05/21/2019 0754   GFRAA >60 02/05/2020 1056   GFRAA 79 05/21/2019 0754   Lab Results  Component Value Date   WBC 5.7 05/20/2020   NEUTROABS PENDING 05/20/2020   HGB 15.4 (H) 05/20/2020   HCT 44.0 05/20/2020   MCV 86.6 05/20/2020   PLT 114 (L) 05/20/2020    Imaging:  [facility] Clinician Interpretation: I have personally reviewed the CNS images as listed.  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 EEG to evaluate for seizure. Level of alertness:  comatose AEDs during EEG study: LEV, propofol Technical aspects: This EEG study was done with scalp electrodes positioned according to the 10-20 International system of electrode placement. Electrical activity was acquired at a sampling rate of '500Hz'  and reviewed with a high frequency filter of '70Hz'  and a low frequency filter of '1Hz' . EEG data were recorded continuously and digitally stored. Description: EEG showed continuous generalized polymorphic frequencies with predominantly 3 to 6 Hz theta-delta slowing as well as 15-'18hz'  generalized beta activity. Hyperventilation and photic  stimulation were not performed.   ABNORMALITY -Continuous slow, generalized IMPRESSION: This study is suggestive of severe diffuse encephalopathy, nonspecific etiology but likely related to sedation. No seizures or epileptiform discharges were seen throughout the recording. Lora Havens   CT Head Wo Contrast  Result Date: 05/14/2020 CLINICAL DATA:  Seizure EXAM: CT HEAD WITHOUT CONTRAST TECHNIQUE: Contiguous axial images were obtained from the base of the skull through the vertex without intravenous contrast. COMPARISON:  11/22/2019 FINDINGS: Brain: Postoperative changes of prior brain tumor resection centered at the paramedian left parietal lobe and corpus callosum splenium. No intracranial hemorrhage or extra-axial collection. No acute abnormality. Vascular: No abnormal hyperdensity of the major intracranial arteries or dural  venous sinuses. No intracranial atherosclerosis. Skull: Remote left parietal craniotomy. Sinuses/Orbits: No fluid levels or advanced mucosal thickening of the visualized paranasal sinuses. No mastoid or middle ear effusion. The orbits are normal. IMPRESSION: 1. No acute intracranial abnormality. 2. Postoperative changes of prior brain tumor resection. Electronically Signed   By: Ulyses Jarred M.D.   On: 05/14/2020 03:03   MR BRAIN W WO CONTRAST  Result Date: 05/14/2020 CLINICAL DATA:  Seizure.  History of glioblastoma. EXAM: MRI HEAD WITHOUT AND WITH CONTRAST TECHNIQUE: Multiplanar, multiecho pulse sequences of the brain and surrounding structures were obtained without and with intravenous contrast. CONTRAST:  35m GADAVIST GADOBUTROL 1 MMOL/ML IV SOLN COMPARISON:  Head CT same day.  MRI 1Nov 01, 2021 FINDINGS: Brain: Pronounced favorable changes since the study of October. The brainstem and cerebellum remain normal. There is less T2 and FLAIR signal within the white matter within the posterior aspects of both cerebral hemispheres. Enhancing tissue along the margins of the previous  resection has markedly involuted. There is a 9 mm enhancing nodule of tissue adjacent to the midline which is a new finding since the previous study. Post resection space appears similar to the previous study. No new areas of increasing mass effect or contrast enhancement seen otherwise. No hydrocephalus. No extra-axial fluid collection. No distant disease manifestation. Vascular: Major vessels at the base of the brain show flow. Skull and upper cervical spine: Left parietal craniotomy as seen previously. Sinuses/Orbits: Clear/normal Other: None IMPRESSION: Favorable evolutionary changes since the study of October. Previously seen enhancement along the margins of the previous resection has markedly involuted. The single exception is that there is a new 9 mm enhancing nodule of tissue adjacent to the midline which could represent a focus of tumor progression within the largely regressing lesion. Some regression of T2 and FLAIR signal as well within the white matter of the posterior brain. Electronically Signed   By: MNelson ChimesM.D.   On: 05/14/2020 20:01   DG Chest Port 1 View  Result Date: 05/15/2020 CLINICAL DATA:  Acute respiratory failure with hypoxemia. EXAM: PORTABLE CHEST 1 VIEW COMPARISON:  May 14, 2020. FINDINGS: Endotracheal tube tip at the level of the inferior clavicular heads. Gastric tube courses below the diaphragm outside the field of view. Similar streaky left basilar opacities, likely atelectasis. No confluent consolidation. No visible pleural effusions or pneumothorax. IMPRESSION: 1. Left basilar atelectasis. 2. Stable support devices. Electronically Signed   By: FMargaretha SheffieldMD   On: 05/15/2020 08:07   DG Chest Port 1 View  Result Date: 05/14/2020 CLINICAL DATA:  Post intubation EXAM: PORTABLE CHEST 1 VIEW COMPARISON:  11/22/2019 FINDINGS: Endotracheal tube is 2 cm above the carina. NG tube is in the stomach. Heart is normal size. Left basilar opacity, likely atelectasis.  Right lung clear. No effusions or pneumothorax. IMPRESSION: Support devices as above. Left base atelectasis. Electronically Signed   By: KRolm BaptiseM.D.   On: 05/14/2020 01:41    Assessment/Plan Glioblastoma (HSunshine [C71.9]  Wanda SZALKOWSKIis clinically improving today, now seizure free since hospitalization.  Tolerating Keppra well.  We recommended continuing treatment with cycle #3 Temozolomide dose reduced to 200 mg/m2, on for five days and off for twenty three days in twenty eight day cycles. The patient will have a complete blood count performed on days 21 and 28 of each cycle, and a comprehensive metabolic panel performed on day 28 of each cycle. Labs may need to be performed more often. Zofran will prescribed for home use  for nausea/vomiting.  Dose reduction is recommended due to thrombocytopenia.  Cycle #3 should not begin until January 3rd, 2022 to allow additional recovery time.    Chemotherapy should be held for the following:  ANC less than 1,000  Platelets less than 100,000  LFT or creatinine greater than 2x ULN  If clinical concerns/contraindications develop  Should continue Keppra 1023m BID, decadron can be decreased to 430mdaily.  FlConan Bowensill return to clinic in 6 weeks with lab for evaluation prior to cycle #4.  All questions were answered. The patient knows to call the clinic with any problems, questions or concerns. No barriers to learning were detected.  I have spent a total of 40 minutes of face-to-face and non-face-to-face time, excluding clinical staff time, preparing to see patient, ordering tests and/or medications, counseling the patient, and independently interpreting results and communicating results to the patient/family/caregiver    ZaVentura SellersMD Medical Director of Neuro-Oncology [facility]Mem[facility] WeMartinsburg2/23/21 10:26 AM

## 2020-05-22 ENCOUNTER — Inpatient Hospital Stay (HOSPITAL_COMMUNITY)
Admission: EM | Admit: 2020-05-22 | Discharge: 2020-05-25 | DRG: 596 | Disposition: A | Discharge status: Home Health | Payer: Medicare PPO | Attending: Family Medicine | Admitting: Family Medicine

## 2020-05-22 ENCOUNTER — Encounter (HOSPITAL_COMMUNITY): Payer: Self-pay | Admitting: *Deleted

## 2020-05-22 DIAGNOSIS — D126 Benign neoplasm of colon, unspecified: Secondary | ICD-10-CM | POA: Diagnosis present

## 2020-05-22 DIAGNOSIS — H409 Unspecified glaucoma: Secondary | ICD-10-CM | POA: Diagnosis present

## 2020-05-22 DIAGNOSIS — B002 Herpesviral gingivostomatitis and pharyngotonsillitis: Secondary | ICD-10-CM | POA: Diagnosis present

## 2020-05-22 DIAGNOSIS — G9389 Other specified disorders of brain: Secondary | ICD-10-CM | POA: Diagnosis not present

## 2020-05-22 DIAGNOSIS — I10 Essential (primary) hypertension: Secondary | ICD-10-CM | POA: Diagnosis present

## 2020-05-22 DIAGNOSIS — D849 Immunodeficiency, unspecified: Secondary | ICD-10-CM | POA: Diagnosis present

## 2020-05-22 DIAGNOSIS — R739 Hyperglycemia, unspecified: Secondary | ICD-10-CM

## 2020-05-22 DIAGNOSIS — R609 Edema, unspecified: Secondary | ICD-10-CM | POA: Diagnosis present

## 2020-05-22 DIAGNOSIS — T380X5A Adverse effect of glucocorticoids and synthetic analogues, initial encounter: Secondary | ICD-10-CM | POA: Diagnosis not present

## 2020-05-22 DIAGNOSIS — B029 Zoster without complications: Secondary | ICD-10-CM | POA: Diagnosis present

## 2020-05-22 DIAGNOSIS — C719 Malignant neoplasm of brain, unspecified: Secondary | ICD-10-CM | POA: Diagnosis present

## 2020-05-22 DIAGNOSIS — E876 Hypokalemia: Secondary | ICD-10-CM | POA: Diagnosis present

## 2020-05-22 DIAGNOSIS — R7401 Elevation of levels of liver transaminase levels: Secondary | ICD-10-CM | POA: Diagnosis not present

## 2020-05-22 DIAGNOSIS — Z79899 Other long term (current) drug therapy: Secondary | ICD-10-CM | POA: Diagnosis not present

## 2020-05-22 DIAGNOSIS — R531 Weakness: Secondary | ICD-10-CM | POA: Diagnosis not present

## 2020-05-22 DIAGNOSIS — E042 Nontoxic multinodular goiter: Secondary | ICD-10-CM | POA: Diagnosis present

## 2020-05-22 DIAGNOSIS — Z8249 Family history of ischemic heart disease and other diseases of the circulatory system: Secondary | ICD-10-CM

## 2020-05-22 DIAGNOSIS — E785 Hyperlipidemia, unspecified: Secondary | ICD-10-CM | POA: Diagnosis present

## 2020-05-22 DIAGNOSIS — Z7952 Long term (current) use of systemic steroids: Secondary | ICD-10-CM

## 2020-05-22 DIAGNOSIS — D696 Thrombocytopenia, unspecified: Secondary | ICD-10-CM | POA: Diagnosis not present

## 2020-05-22 DIAGNOSIS — Z20822 Contact with and (suspected) exposure to covid-19: Secondary | ICD-10-CM | POA: Diagnosis present

## 2020-05-22 DIAGNOSIS — R21 Rash and other nonspecific skin eruption: Secondary | ICD-10-CM | POA: Diagnosis present

## 2020-05-22 DIAGNOSIS — R569 Unspecified convulsions: Secondary | ICD-10-CM | POA: Diagnosis not present

## 2020-05-22 DIAGNOSIS — B028 Zoster with other complications: Secondary | ICD-10-CM

## 2020-05-22 DIAGNOSIS — E782 Mixed hyperlipidemia: Secondary | ICD-10-CM

## 2020-05-22 LAB — COMPREHENSIVE METABOLIC PANEL
ALT: 49 U/L — ABNORMAL HIGH (ref 0–44)
AST: 34 U/L (ref 15–41)
Albumin: 3.1 g/dL — ABNORMAL LOW (ref 3.5–5.0)
Alkaline Phosphatase: 51 U/L (ref 38–126)
Anion gap: 10 (ref 5–15)
BUN: 19 mg/dL (ref 8–23)
CO2: 26 mmol/L (ref 22–32)
Calcium: 8.5 mg/dL — ABNORMAL LOW (ref 8.9–10.3)
Chloride: 99 mmol/L (ref 98–111)
Creatinine, Ser: 0.7 mg/dL (ref 0.44–1.00)
GFR, Estimated: >60 mL/min (ref >60)
Glucose, Bld: 84 mg/dL (ref 70–99)
Potassium: 4.3 mmol/L (ref 3.5–5.1)
Sodium: 135 mmol/L (ref 135–145)
Total Bilirubin: 1.3 mg/dL — ABNORMAL HIGH (ref 0.3–1.2)
Total Protein: 5.9 g/dL — ABNORMAL LOW (ref 6.5–8.1)

## 2020-05-22 LAB — CBC WITH DIFFERENTIAL/PLATELET
Band Neutrophils: 7 %
Basophils Absolute: 0 10*3/uL (ref 0.0–0.1)
Basophils Relative: 0 %
Eosinophils Absolute: 0 10*3/uL (ref 0.0–0.5)
Eosinophils Relative: 0 %
HCT: 41.7 % (ref 36.0–46.0)
Hemoglobin: 14.6 g/dL (ref 12.0–15.0)
Lymphocytes Relative: 8 %
Lymphs Abs: 0.6 10*3/uL — ABNORMAL LOW (ref 0.7–4.0)
MCH: 30.9 pg (ref 26.0–34.0)
MCHC: 35 g/dL (ref 30.0–36.0)
MCV: 88.3 fL (ref 80.0–100.0)
Metamyelocytes Relative: 2 %
Monocytes Absolute: 0.4 10*3/uL (ref 0.1–1.0)
Monocytes Relative: 5 %
Myelocytes: 1 %
Neutro Abs: 6.1 10*3/uL (ref 1.7–7.7)
Neutrophils Relative %: 77 %
Platelets: 94 10*3/uL — ABNORMAL LOW (ref 150–400)
RBC: 4.72 MIL/uL (ref 3.87–5.11)
RDW: 14 % (ref 11.5–15.5)
WBC: 7.3 10*3/uL (ref 4.0–10.5)
nRBC: 0.4 % — ABNORMAL HIGH (ref 0.0–0.2)

## 2020-05-22 LAB — RESP PANEL BY RT-PCR (FLU A&B, COVID) ARPGX2
Influenza A by PCR: NEGATIVE
Influenza B by PCR: NEGATIVE
SARS Coronavirus 2 by RT PCR: NEGATIVE

## 2020-05-22 MED ORDER — ONDANSETRON HCL 4 MG PO TABS: 4.0000 mg | ORAL_TABLET | Freq: Four times a day (QID) | ORAL | Status: DC | PRN

## 2020-05-22 MED ORDER — ADULT MULTIVITAMIN W/MINERALS CH: 1.0000 | ORAL_TABLET | Freq: Once | ORAL | Status: DC

## 2020-05-22 MED ORDER — ENOXAPARIN SODIUM 40 MG/0.4ML ~~LOC~~ SOLN
40.0000 mg | SUBCUTANEOUS | Status: DC
  Administered 2020-05-22 – 2020-05-24 (×3): 40 mg via SUBCUTANEOUS
  Filled 2020-05-22 (×3): qty 0.4

## 2020-05-22 MED ORDER — AMLODIPINE BESYLATE 5 MG PO TABS
5.0000 mg | ORAL_TABLET | Freq: Once | ORAL | Status: AC
  Administered 2020-05-22: 5 mg via ORAL
  Filled 2020-05-22: qty 1

## 2020-05-22 MED ORDER — LEVETIRACETAM 500 MG PO TABS
1000.0000 mg | ORAL_TABLET | Freq: Two times a day (BID) | ORAL | Status: DC
  Administered 2020-05-22 – 2020-05-25 (×6): 1000 mg via ORAL
  Filled 2020-05-22 (×6): qty 2

## 2020-05-22 MED ORDER — CALCIUM CARBONATE 1250 (500 CA) MG PO TABS: 1.0000 | ORAL_TABLET | Freq: Once | ORAL | Status: DC

## 2020-05-22 MED ORDER — DOCUSATE SODIUM 100 MG PO CAPS
100.0000 mg | ORAL_CAPSULE | Freq: Once | ORAL | Status: DC
  Filled 2020-05-22: qty 1

## 2020-05-22 MED ORDER — VITAMIN B-12 1000 MCG PO TABS
2500.0000 ug | ORAL_TABLET | Freq: Every day | ORAL | Status: DC
  Administered 2020-05-22 – 2020-05-24 (×3): 2500 ug via ORAL
  Filled 2020-05-22 (×3): qty 3

## 2020-05-22 MED ORDER — BISACODYL 5 MG PO TBEC: 5.0000 mg | DELAYED_RELEASE_TABLET | Freq: Every day | ORAL | Status: DC | PRN

## 2020-05-22 MED ORDER — PRAVASTATIN SODIUM 10 MG PO TABS
10.0000 mg | ORAL_TABLET | Freq: Every day | ORAL | Status: DC
  Administered 2020-05-22 – 2020-05-24 (×3): 10 mg via ORAL
  Filled 2020-05-22 (×5): qty 1

## 2020-05-22 MED ORDER — SODIUM CHLORIDE 0.9 % IV SOLN: INTRAVENOUS | Status: DC

## 2020-05-22 MED ORDER — DEXAMETHASONE 4 MG PO TABS
4.0000 mg | ORAL_TABLET | Freq: Once | ORAL | Status: AC
  Administered 2020-05-22: 4 mg via ORAL
  Filled 2020-05-22: qty 1

## 2020-05-22 MED ORDER — CLONIDINE HCL 0.2 MG PO TABS
0.3000 mg | ORAL_TABLET | Freq: Every day | ORAL | Status: DC
  Administered 2020-05-22 – 2020-05-24 (×3): 0.3 mg via ORAL
  Filled 2020-05-22 (×3): qty 1

## 2020-05-22 MED ORDER — ACETAMINOPHEN 650 MG RE SUPP: 650.0000 mg | Freq: Four times a day (QID) | RECTAL | Status: DC

## 2020-05-22 MED ORDER — LABETALOL HCL 5 MG/ML IV SOLN: 10.0000 mg | INTRAVENOUS | Status: DC | PRN

## 2020-05-22 MED ORDER — LEVETIRACETAM 500 MG PO TABS
1000.0000 mg | ORAL_TABLET | Freq: Once | ORAL | Status: AC
  Administered 2020-05-22: 1000 mg via ORAL
  Filled 2020-05-22: qty 2

## 2020-05-22 MED ORDER — ONDANSETRON HCL 4 MG/2ML IJ SOLN: 4.0000 mg | Freq: Four times a day (QID) | INTRAMUSCULAR | Status: DC | PRN

## 2020-05-22 MED ORDER — AMLODIPINE BESYLATE 5 MG PO TABS
5.0000 mg | ORAL_TABLET | Freq: Every day | ORAL | Status: DC
  Administered 2020-05-23 – 2020-05-25 (×3): 5 mg via ORAL
  Filled 2020-05-22 (×3): qty 1

## 2020-05-22 MED ORDER — DEXAMETHASONE 4 MG PO TABS
4.0000 mg | ORAL_TABLET | Freq: Every day | ORAL | Status: DC
  Administered 2020-05-23 – 2020-05-25 (×3): 4 mg via ORAL
  Filled 2020-05-22 (×3): qty 1

## 2020-05-22 MED ORDER — ACETAMINOPHEN 325 MG PO TABS
650.0000 mg | ORAL_TABLET | Freq: Four times a day (QID) | ORAL | Status: DC
  Administered 2020-05-22 – 2020-05-25 (×11): 650 mg via ORAL
  Filled 2020-05-22 (×11): qty 2

## 2020-05-22 MED ORDER — DEXTROSE 5 % IV SOLN
10.0000 mg/kg | Freq: Three times a day (TID) | INTRAVENOUS | Status: DC
  Administered 2020-05-22 – 2020-05-25 (×9): 680 mg via INTRAVENOUS
  Filled 2020-05-22 (×13): qty 13.6

## 2020-05-22 MED ORDER — ACYCLOVIR SODIUM 50 MG/ML IV SOLN
INTRAVENOUS | Status: AC
  Filled 2020-05-22: qty 20

## 2020-05-22 MED ORDER — OXYCODONE HCL 5 MG PO TABS
5.0000 mg | ORAL_TABLET | ORAL | Status: DC | PRN
Start: 2020-05-22 — End: 2020-05-25

## 2020-05-22 MED ORDER — DORZOLAMIDE HCL-TIMOLOL MAL 2-0.5 % OP SOLN
1.0000 [drp] | Freq: Two times a day (BID) | OPHTHALMIC | Status: DC
  Administered 2020-05-22 – 2020-05-25 (×6): 1 [drp] via OPHTHALMIC
  Filled 2020-05-22 (×3): qty 10

## 2020-05-22 MED ORDER — HYDROMORPHONE HCL 1 MG/ML IJ SOLN: 0.5000 mg | INTRAMUSCULAR | Status: DC | PRN

## 2020-05-22 NOTE — H&P (Signed)
History and Physical  [place] [id number] DOB: 1951-05-07 DOA: 05/22/2020  PCP: Fayrene Helper, MD  Patient coming from: Home  I have personally briefly reviewed patient's old medical records in [place]  Chief Complaint: Rash on right face  HPI: Wanda Ross is a 69 y.o. 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  She presented initially in late June 2021 with progressive symptoms described as gait impairment confusion and disorientation.  Her CNS imaging demonstrated a 4 to 5 cm mass in the left parietal region and she underwent craniotomy and resection by Dr. Marcello Moores on 11/26/2019.  She has since had residual right-sided weakness.  She lives with her sister.  She was recently hospitalized at [facility] for seizures.  She has been on Keppra 1000 mg twice daily and has been seizure-free since discharge.  After her recent discharge she began to experience swelling on the right side of the face that has significantly gotten worse.  She has developed blisters on the right side of the face that have been very painful.  No loss of vision.  No difficulty eating or drinking.  She also reports that she has had lesions inside of the mouth.  She has had no loss of hearing and no pain in the ear.  She has had no fever chills or altered mentation.  Patient was diagnosed with herpes zoster in the emergency department and infectious disease was consulted and they were able to view years of her lesions and recommended 24 to 48 hours of IV acyclovir treatment given immunocompromise status as patient is on daily dexamethasone and currently undergoing chemotherapy for glioblastoma.  Review of Systems: Review of Systems  Constitutional: Positive for malaise/fatigue. Negative for chills, diaphoresis, fever and weight loss.  HENT: Positive for sore throat. Negative for congestion, ear discharge, ear pain, hearing loss,  nosebleeds, sinus pain and tinnitus.   Eyes: Negative.   Respiratory: Negative.  Negative for stridor.   Cardiovascular: Negative.   Gastrointestinal: Negative.   Genitourinary: Negative.   Musculoskeletal: Negative.   Skin: Positive for itching and rash.  Neurological: Positive for dizziness, focal weakness, seizures, loss of consciousness and weakness. Negative for tingling and headaches.  Endo/Heme/Allergies: Negative for environmental allergies and polydipsia. Does not bruise/bleed easily.  Psychiatric/Behavioral: Negative.   All other systems reviewed and are negative.   Past Medical History:  Diagnosis Date  . Glaucoma   . Hyperlipidemia 2010  . Hypertension 1990  . IGT (impaired glucose tolerance) 05/11/2010  . Multinodular goiter 2010   normal function    Past Surgical History:  Procedure Laterality Date  . APPLICATION OF CRANIAL NAVIGATION Left 11/26/2019   Procedure: APPLICATION OF CRANIAL NAVIGATION;  Surgeon: [name], MD;  Location: [place];  Service: Neurosurgery;  Laterality: Left;  APPLICATION OF CRANIAL NAVIGATION  . COLONOSCOPY N/A 06/16/2016   Procedure: COLONOSCOPY;  Surgeon: Danie Binder, MD;  [facility];  Service: Endoscopy;  Laterality: N/A;  9:30 AM  . COLONOSCOPY N/A 10/22/2019   Procedure: COLONOSCOPY;  Surgeon: Daneil Dolin, MD;  [facility];  Service: Endoscopy;  Laterality: N/A;  9:30  . CRANIOTOMY Left 11/26/2019   Procedure: LEFT PARIETAL CRANIOTOMY FOR RESECTION OF TUMOR;  Surgeon: [name], MD;  Location: [place];  Service: Neurosurgery;  Laterality: Left;  LEFT PARIETAL CRANIOTOMY FOR RESECTION OF TUMOR  . EYE SURGERY  2017   3 on left eye 1  on right eye for glaucoma by Dr. Venetia Maxon   . EYE SURGERY  2018   Cataract Surgery by Dr. Arlina Robes   . OPERATIVE ULTRASOUND N/A 11/26/2019   Procedure: OPERATIVE ULTRASOUND;  Surgeon: [name], MD;  Location: [place];  Service: Neurosurgery;  Laterality: N/A;   OPERATIVE ULTRASOUND     reports that she has never smoked. She has never used smokeless tobacco. She reports that she does not drink alcohol and does not use drugs.  No Known Allergies  Family History  Problem Relation Age of Onset  . Dementia Father   . Dementia Mother   . Diabetes Mother   . Hypertension Mother   . Breast cancer Sister   . Hypertension Sister   . Diabetes Brother     Prior to Admission medications   Medication Sig Start Date End Date Taking? Authorizing Provider  acetaminophen (TYLENOL) 325 MG tablet Take 2 tablets (650 mg total) by mouth every 4 (four) hours as needed for mild pain (temp > 100.5). 12/02/19   Guilford Shi, MD  alendronate (FOSAMAX) 70 MG tablet Take 1 tablet by mouth every Sunday. 12/14/19   [provider]  amLODipine (NORVASC) 5 MG tablet Take 1 tablet (5 mg total) by mouth daily. 12/09/19   Angiulli, Lavon Paganini, PA-C  Calcium Carbonate-Vit D-Min (CALCIUM 1200) 1200-1000 MG-UNIT CHEW Chew 1,200 mg by mouth daily. 12/09/19   Angiulli, Lavon Paganini, PA-C  cloNIDine (CATAPRES) 0.3 MG tablet TAKE 1 TABLET BY MOUTH EVERYDAY AT BEDTIME Patient taking differently: Take 0.3 mg by mouth at bedtime. 12/09/19   Angiulli, Lavon Paganini, PA-C  Cyanocobalamin 2500 MCG TABS Take 2,500 mcg by mouth daily. Patient taking differently: Take 2,500 mcg by mouth at bedtime. 12/09/19   Angiulli, Lavon Paganini, PA-C  dexamethasone (DECADRON) 4 MG tablet Take 4 mg by mouth daily.    [provider]  docusate sodium (COLACE) 100 MG capsule Take 1 capsule (100 mg total) by mouth 2 (two) times daily as needed for mild constipation. 05/19/20   Aline August, MD  dorzolamide-timolol (COSOPT) 22.3-6.8 MG/ML ophthalmic solution Place 1 drop into the right eye 2 (two) times daily.  06/06/19 06/05/20  [provider]  levETIRAcetam (KEPPRA) 1000 MG tablet Take 1 tablet (1,000 mg total) by mouth 2 (two) times daily. 05/19/20   Aline August, MD  Multiple Vitamins-Minerals  (CENTRUM SILVER ULTRA WOMENS) TABS Take 1 tablet by mouth daily.    [provider]  ondansetron (ZOFRAN) 8 MG tablet Take 1 tablet (8 mg total) by mouth 2 (two) times daily as needed (nausea and vomiting). May take 30-60 minutes prior to Temodar administration if nausea/vomiting occurs. Patient not taking: Reported on 05/20/2020 03/08/20   Ventura Sellers, MD  pravastatin (PRAVACHOL) 10 MG tablet Take 1 tablet (10 mg total) by mouth at bedtime. 05/19/20   Aline August, MD    Physical Exam: Vitals:   05/22/20 1145 05/22/20 1149 05/22/20 1400  BP: (!) 141/99 (!) 141/99 (!) 159/94  Pulse: (!) 101 98 93  Resp:  20   Temp:  99.1 F (37.3 C)   SpO2: 96% 97% 97%   Constitutional: NAD, calm, comfortable Eyes: PERRL, lids and conjunctivae normal ENMT: Mucous membranes are moist. Posterior pharynx clear of any exudate or lesions. Blisters in the mouth.   Neck: normal, supple, no masses, no thyromegaly Respiratory: clear to auscultation bilaterally, no wheezing, no crackles. Normal respiratory effort. No accessory muscle use.  Cardiovascular: Regular rate and rhythm, no murmurs /  rubs / gallops. No extremity edema. 2+ pedal pulses. No carotid bruits.  Abdomen: no tenderness, no masses palpated. No hepatosplenomegaly. Bowel sounds positive.  Musculoskeletal: no clubbing / cyanosis. No joint deformity upper and lower extremities. Good ROM, no contractures. Normal muscle tone.  Skin: herpes zoster lesion extensive on right side of face  Neurologic: CN 2-12 grossly intact. Sensation intact, DTR normal. Strength 5/5 in all 4.  Psychiatric: Normal judgment and insight. Alert and oriented x 3. Normal mood.   Labs on Admission: I have personally reviewed following labs and imaging studies  CBC: Recent Labs  Lab 05/18/20 0545 05/19/20 0859 05/20/20 0957 05/22/20 1243  WBC 3.2* 4.8 5.7 7.3  NEUTROABS  --   --  4.6 6.1  HGB 15.9* 17.0* 15.4* 14.6  HCT 46.7* 49.0* 44.0 41.7  MCV 90.3  88.1 86.6 88.3  PLT 84* 113* 114* 94*   Basic Metabolic Panel: Recent Labs  Lab 05/15/20 1634 05/18/20 1624 05/19/20 0706 05/20/20 0957 05/22/20 1243  NA  --  141 139 140 135  K  --  4.1 4.3 4.2 4.3  CL  --  102 103 105 99  CO2  --  25 24 25 26   GLUCOSE  --  161* 117* 116* 84  BUN  --  38* 32* 23 19  CREATININE  --  1.05* 0.72 0.77 0.70  CALCIUM  --  9.2 8.9 9.1 8.5*  MG 2.4 2.4 2.2  --   --   PHOS 3.2  --   --   --   --    GFR: Estimated Creatinine Clearance: 60.4 mL/min (by C-G formula based on SCr of 0.7 mg/dL). Liver Function Tests: Recent Labs  Lab 05/18/20 1624 05/19/20 0706 05/20/20 0957 05/22/20 1243  AST 25 22 17  34  ALT 37 36 46* 49*  ALKPHOS 59 49 65 51  BILITOT 0.6 0.8 0.6 1.3*  PROT 6.6 5.8* 6.2* 5.9*  ALBUMIN 3.4* 3.2* 3.0* 3.1*   No results for input(s): LIPASE, AMYLASE in the last 168 hours. No results for input(s): AMMONIA in the last 168 hours. Coagulation Profile: No results for input(s): INR, PROTIME in the last 168 hours. Cardiac Enzymes: No results for input(s): CKTOTAL, CKMB, CKMBINDEX, TROPONINI in the last 168 hours. BNP (last 3 results) No results for input(s): PROBNP in the last 8760 hours. HbA1C: No results for input(s): HGBA1C in the last 72 hours. CBG: Recent Labs  Lab 05/17/20 0001 05/17/20 0328 05/17/20 0738 05/17/20 1152 05/17/20 1715  GLUCAP 125* 108* 105* 101* 122*   Lipid Profile: No results for input(s): CHOL, HDL, LDLCALC, TRIG, CHOLHDL, LDLDIRECT in the last 72 hours. Thyroid Function Tests: No results for input(s): TSH, T4TOTAL, FREET4, T3FREE, THYROIDAB in the last 72 hours. Anemia Panel: No results for input(s): VITAMINB12, FOLATE, FERRITIN, TIBC, IRON, RETICCTPCT in the last 72 hours. Urine analysis:    Component Value Date/Time   COLORURINE YELLOW 05/14/2020 0115   APPEARANCEUR HAZY (A) 05/14/2020 0115   LABSPEC 1.018 05/14/2020 0115   PHURINE 5.0 05/14/2020 0115   GLUCOSEU >=500 (A) 05/14/2020 0115    HGBUR NEGATIVE 05/14/2020 0115   BILIRUBINUR NEGATIVE 05/14/2020 0115   KETONESUR NEGATIVE 05/14/2020 0115   PROTEINUR 100 (A) 05/14/2020 0115   NITRITE NEGATIVE 05/14/2020 0115   LEUKOCYTESUR NEGATIVE 05/14/2020 0115    Radiological Exams on Admission: No results found.  Assessment/Plan Active Problems:   Hyperlipidemia   Malignant hypertension   Colon adenomas   Brain mass   Right sided weakness  Steroid-induced hyperglycemia   Transaminitis   Glioblastoma (HCC)   Seizures (HCC)   Thrombocytopenia (HCC)   Herpes zoster   Multinodular goiter  1. Herpes zoster rash involving right side of face with extensive lesions-patient is highly immunocompromise given active chemotherapy and daily dexamethasone use.  ID was consulted in the emergency department and recommended 24 to 48 hours of IV acyclovir which has been started.  Continue acyclovir and monitor clinical course closely.  Airborne and contact precautions advised.  I spoke with the ED physician and he did not feel the patient needed a lumbar puncture at this time given no mental status changes. 2. Glioblastoma-patient is currently undergoing chemotherapy.  Outpatient follow-up with heme-onc. 3. Seizures-patient has been seizure-free since being on Keppra 1000 mg twice daily which we will continue.  Seizure precautions 4. Hypertension-resume home medications and follow with IV labetalol ordered as needed for elevated blood pressure readings. 5. Hyperlipidemia resume home nightly statin therapy.   DVT prophylaxis: Enoxaparin Code Status: Full Family Communication: Sister at bedside Disposition Plan: Home when medically stable Consults called: Telephone infectious disease consult in the emergency department Admission status: INP  Shyna Duignan MD [facility] How to contact the [facility] Attending or Consulting provider [place] or covering provider during after hours [place], for this patient?  1. Check the care team in  [facility] and look for a) attending/consulting [facility] provider listed and b) the [facility] team listed 2. Log into [email or web address] and use [place]'s universal password to access. If you do not have the password, please contact the hospital operator. 3. Locate the [facility] provider you are looking for under [facility] and page to a number that you can be directly reached. 4. If you still have difficulty reaching the provider, please page the [facility] (Director on Call) for the Hospitalists listed on amion for assistance.   If 7PM-7AM, please contact night-coverage [email or web address] Password [facility]  05/22/2020, 2:37 PM

## 2020-05-22 NOTE — ED Triage Notes (Signed)
Blistery rash on right side of face with swelling to right eye onset 3 days ago

## 2020-05-22 NOTE — ED Provider Notes (Signed)
[facility] EMERGENCY DEPARTMENT Provider Note   CSN: [id number] Arrival date & time: 05/22/20  1057     History Chief Complaint  Patient presents with  . Herpes Zoster    Wanda Ross is a 69 y.o. female.  HPI 69 year old female presents with right facial swelling and redness.  Started with some swelling since she got out of the hospital but it seemed to significantly worsen today.  Still eating and drinking fine.  Feels like there are lesions in her mouth.  No fevers or altered mental status.  No ear pain.   Past Medical History:  Diagnosis Date  . Glaucoma   . Hyperlipidemia 2010  . Hypertension 1990  . IGT (impaired glucose tolerance) 05/11/2010  . Multinodular goiter 2010   normal function    Patient Active Problem List   Diagnosis Date Noted  . Herpes zoster 05/22/2020  . Acute respiratory failure with hypoxemia ([place]) 05/14/2020  . Seizures ([place]) 05/14/2020  . Thrombocytopenia ([place]) 05/14/2020  . Lactic acidosis 05/14/2020  . Hypokalemia 05/14/2020  . Hyperglycemia 05/14/2020  . Pressure injury of skin 05/14/2020  . Goals of care, counseling/discussion 12/15/2019  . Glioblastoma ([place])   . Hypoalbuminemia due to protein-calorie malnutrition ([place])   . Transaminitis   . Benign essential HTN   . Prediabetes   . Seizure prophylaxis   . Glioma ([place]) 12/02/2019  . Right sided weakness   . Dyslipidemia   . Essential hypertension   . Steroid-induced hyperglycemia   . Leucocytosis   . Brain mass 11/22/2019  . 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 Abnormal electrocardiogram (ECG) (EKG) 12/31/2017  . White coat syndrome with diagnosis of hypertension 12/31/2017  . Glaucoma 12/26/2013  . Vitamin D deficiency 05/27/2009  . IMPAIRED GLUCOSE TOLERANCE 05/27/2009  . OSTEOPENIA 05/27/2009  . Multinodular goiter 2010  . Multiple thyroid nodules 11/06/2007  . Hyperlipidemia 11/06/2007  . Malignant hypertension 11/06/2007    Past Surgical History:  Procedure  Laterality Date  . APPLICATION OF CRANIAL NAVIGATION Left 11/26/2019   Procedure: APPLICATION OF CRANIAL NAVIGATION;  Surgeon: [name], MD;  Location: [place];  Service: Neurosurgery;  Laterality: Left;  APPLICATION OF CRANIAL NAVIGATION  . COLONOSCOPY N/A 06/16/2016   Procedure: COLONOSCOPY;  Surgeon: Danie Binder, MD;  [facility];  Service: Endoscopy;  Laterality: N/A;  9:30 AM  . COLONOSCOPY N/A 10/22/2019   Procedure: COLONOSCOPY;  Surgeon: Daneil Dolin, MD;  [facility];  Service: Endoscopy;  Laterality: N/A;  9:30  . CRANIOTOMY Left 11/26/2019   Procedure: LEFT PARIETAL CRANIOTOMY FOR RESECTION OF TUMOR;  Surgeon: [name], MD;  Location: [place];  Service: Neurosurgery;  Laterality: Left;  LEFT PARIETAL CRANIOTOMY FOR RESECTION OF TUMOR  . EYE SURGERY  2017   3 on left eye 1 on right eye for glaucoma by Dr. Venetia Maxon   . EYE SURGERY  2018   Cataract Surgery by Dr. Arlina Robes   . OPERATIVE ULTRASOUND N/A 11/26/2019   Procedure: OPERATIVE ULTRASOUND;  Surgeon: [name], MD;  Location: [place];  Service: Neurosurgery;  Laterality: N/A;  OPERATIVE ULTRASOUND     OB History   No obstetric history on file.     Family History  Problem Relation Age of Onset  . Dementia Father   . Dementia Mother   . Diabetes Mother   . Hypertension Mother   . Breast cancer Sister   . Hypertension Sister   . Diabetes Brother  Social History   Tobacco Use  . Smoking status: Never Smoker  . Smokeless tobacco: Never Used  Vaping Use  . Vaping Use: Never used  Substance Use Topics  . Alcohol use: No  . Drug use: No    Home Medications Prior to Admission medications   Medication Sig Start Date End Date Taking? Authorizing Provider  acetaminophen (TYLENOL) 325 MG tablet Take 2 tablets (650 mg total) by mouth every 4 (four) hours as needed for mild pain (temp > 100.5). 12/02/19   Guilford Shi, MD  alendronate (FOSAMAX) 70 MG tablet Take 1 tablet  by mouth every Sunday. 12/14/19   [provider]  amLODipine (NORVASC) 5 MG tablet Take 1 tablet (5 mg total) by mouth daily. 12/09/19   Angiulli, Lavon Paganini, PA-C  Calcium Carbonate-Vit D-Min (CALCIUM 1200) 1200-1000 MG-UNIT CHEW Chew 1,200 mg by mouth daily. 12/09/19   Angiulli, Lavon Paganini, PA-C  cloNIDine (CATAPRES) 0.3 MG tablet TAKE 1 TABLET BY MOUTH EVERYDAY AT BEDTIME Patient taking differently: Take 0.3 mg by mouth at bedtime. 12/09/19   Angiulli, Lavon Paganini, PA-C  Cyanocobalamin 2500 MCG TABS Take 2,500 mcg by mouth daily. Patient taking differently: Take 2,500 mcg by mouth at bedtime. 12/09/19   Angiulli, Lavon Paganini, PA-C  dexamethasone (DECADRON) 4 MG tablet Take 4 mg by mouth daily.    [provider]  docusate sodium (COLACE) 100 MG capsule Take 1 capsule (100 mg total) by mouth 2 (two) times daily as needed for mild constipation. 05/19/20   Aline August, MD  dorzolamide-timolol (COSOPT) 22.3-6.8 MG/ML ophthalmic solution Place 1 drop into the right eye 2 (two) times daily.  06/06/19 06/05/20  [provider]  levETIRAcetam (KEPPRA) 1000 MG tablet Take 1 tablet (1,000 mg total) by mouth 2 (two) times daily. 05/19/20   Aline August, MD  Multiple Vitamins-Minerals (CENTRUM SILVER ULTRA WOMENS) TABS Take 1 tablet by mouth daily.    [provider]  ondansetron (ZOFRAN) 8 MG tablet Take 1 tablet (8 mg total) by mouth 2 (two) times daily as needed (nausea and vomiting). May take 30-60 minutes prior to Temodar administration if nausea/vomiting occurs. Patient not taking: Reported on 05/20/2020 03/08/20   Ventura Sellers, MD  pravastatin (PRAVACHOL) 10 MG tablet Take 1 tablet (10 mg total) by mouth at bedtime. 05/19/20   Aline August, MD    Allergies    Patient has no known allergies.  Review of Systems   Review of Systems  Constitutional: Negative for fever.  HENT: Positive for facial swelling.   Eyes: Negative for pain and visual disturbance.  Skin:  Positive for rash.  Psychiatric/Behavioral: Negative for confusion.  All other systems reviewed and are negative.   Physical Exam Updated Vital Signs BP (!) 159/94   Pulse 93   Temp 99.1 F (37.3 C)   Resp 20   SpO2 97%   Physical Exam Vitals and nursing note reviewed.  Constitutional:      General: She is not in acute distress.    Appearance: She is well-developed and well-nourished. She is not ill-appearing or diaphoretic.  HENT:     Head: Normocephalic and atraumatic.     Comments: See picture. Has extensive right facial zoster. Does not involve nose. No facial weakness. Has lip/right sided tongue and inner cheek lesions as well    Right Ear: External ear normal.     Left Ear: External ear normal.     Ears:     Comments: Right ear canal has zoster  lesions. Some narrowing of canal. TM seems to be intact    Nose: Nose normal.  Eyes:     General:        Right eye: No discharge.        Left eye: No discharge.     Extraocular Movements: Extraocular movements intact.     Pupils: Pupils are equal, round, and reactive to light.     Comments: Right periorbital swelling but no ocular injection or pain  Cardiovascular:     Rate and Rhythm: Normal rate and regular rhythm.     Heart sounds: Normal heart sounds.  Pulmonary:     Effort: Pulmonary effort is normal.     Breath sounds: Normal breath sounds.  Abdominal:     General: There is no distension.     Palpations: Abdomen is soft.     Tenderness: There is no abdominal tenderness.  Skin:    General: Skin is warm and dry.  Neurological:     Mental Status: She is alert.  Psychiatric:        Mood and Affect: Mood is not anxious.          ED Results / Procedures / Treatments   Labs (all labs ordered are listed, but only abnormal results are displayed) Labs Reviewed  COMPREHENSIVE METABOLIC PANEL - Abnormal; Notable for the following components:      Result Value   Calcium 8.5 (*)    Total Protein 5.9 (*)    Albumin  3.1 (*)    ALT 49 (*)    Total Bilirubin 1.3 (*)    All other components within normal limits  CBC WITH DIFFERENTIAL/PLATELET - Abnormal; Notable for the following components:   Platelets 94 (*)    nRBC 0.4 (*)    Lymphs Abs 0.6 (*)    All other components within normal limits  RESP PANEL BY RT-PCR (FLU A&B, COVID) [id number]    EKG None  Radiology No results found.  Procedures Procedures (including critical care time)  Medications Ordered in ED Medications  acyclovir (ZOVIRAX) 680 mg in dextrose 5 % 100 mL IVPB (680 mg Intravenous New Bag/Given 05/22/20 1357)  calcium carbonate (OS-CAL - dosed in mg of elemental calcium) tablet 500 mg of elemental calcium (1,200 mg Oral Not Given 05/22/20 1353)  docusate sodium (COLACE) capsule 100 mg (100 mg Oral Patient Refused/Not Given 05/22/20 1352)  multivitamin with minerals tablet 1 tablet (1 tablet Oral Not Given 05/22/20 1354)  amLODipine (NORVASC) tablet 5 mg (5 mg Oral Given 05/22/20 1352)  dexamethasone (DECADRON) tablet 4 mg (4 mg Oral Given 05/22/20 1353)  levETIRAcetam (KEPPRA) tablet 1,000 mg (1,000 mg Oral Given 05/22/20 1352)    ED Course  I have reviewed the triage vital signs and the nursing notes.  Pertinent labs & imaging results that were available during my care of the patient were reviewed by me and considered in my medical decision making (see chart for details).    MDM Rules/Calculators/A&P                          Patient is not altered or ill appearing. Does have significant facial zoster that involves her mouth and ear. No tip of the nose or ocular involvement. No fevers. Discussed with ID, Dr. Renold Don, who has viewed the picture in the chart. Advises IV acyclovir for next 24-48 hours and admission. Labs overall benign. Doubt CNS involvement. No facial palsy. Dr. Laural Benes to admit.  Final Clinical Impression(s) / ED Diagnoses Final diagnoses:  Herpes zoster with complication    Rx / DC Orders ED Discharge Orders     None       Sherwood Gambler, MD 05/22/20 1430

## 2020-05-23 ENCOUNTER — Other Ambulatory Visit: Payer: Self-pay

## 2020-05-23 DIAGNOSIS — B029 Zoster without complications: Secondary | ICD-10-CM | POA: Diagnosis not present

## 2020-05-23 DIAGNOSIS — G9389 Other specified disorders of brain: Secondary | ICD-10-CM

## 2020-05-23 DIAGNOSIS — D126 Benign neoplasm of colon, unspecified: Secondary | ICD-10-CM | POA: Diagnosis not present

## 2020-05-23 DIAGNOSIS — C719 Malignant neoplasm of brain, unspecified: Secondary | ICD-10-CM | POA: Diagnosis not present

## 2020-05-23 LAB — COMPREHENSIVE METABOLIC PANEL
ALT: 48 U/L — ABNORMAL HIGH (ref 0–44)
AST: 27 U/L (ref 15–41)
Albumin: 2.7 g/dL — ABNORMAL LOW (ref 3.5–5.0)
Alkaline Phosphatase: 47 U/L (ref 38–126)
Anion gap: 10 (ref 5–15)
BUN: 18 mg/dL (ref 8–23)
CO2: 27 mmol/L (ref 22–32)
Calcium: 8.2 mg/dL — ABNORMAL LOW (ref 8.9–10.3)
Chloride: 102 mmol/L (ref 98–111)
Creatinine, Ser: 0.71 mg/dL (ref 0.44–1.00)
GFR, Estimated: >60 mL/min (ref >60)
Glucose, Bld: 157 mg/dL — ABNORMAL HIGH (ref 70–99)
Potassium: 3 mmol/L — ABNORMAL LOW (ref 3.5–5.1)
Sodium: 139 mmol/L (ref 135–145)
Total Bilirubin: 0.6 mg/dL (ref 0.3–1.2)
Total Protein: 5.1 g/dL — ABNORMAL LOW (ref 6.5–8.1)

## 2020-05-23 LAB — MAGNESIUM: Magnesium: 1.9 mg/dL (ref 1.7–2.4)

## 2020-05-23 MED ORDER — POTASSIUM CHLORIDE 10 MEQ/100ML IV SOLN
10.0000 meq | INTRAVENOUS | Status: AC
  Administered 2020-05-23 (×4): 10 meq via INTRAVENOUS
  Filled 2020-05-23 (×4): qty 100

## 2020-05-23 NOTE — Progress Notes (Addendum)
PROGRESS NOTE   Wanda Ross  UXN:[id number] DOB: 09-11-1950 DOA: 05/22/2020 PCP: Fayrene Helper, MD   Chief Complaint  Patient presents with  . 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  She presented initially in late June 2021 with progressive symptoms described as gait impairment confusion and disorientation.  Her CNS imaging demonstrated a 4 to 5 cm mass in the left parietal region and she underwent craniotomy and resection by Dr. Marcello Moores on 11/26/2019.  She has since had residual right-sided weakness.  She lives with her sister.  She was recently hospitalized at [facility] for seizures.  She has been on Keppra 1000 mg twice daily and has been seizure-free since discharge.  After her recent discharge she began to experience swelling on the right side of the face that has significantly gotten worse.  She has developed blisters on the right side of the face that have been very painful.  No loss of vision.  No difficulty eating or drinking.  She also reports that she has had lesions inside of the mouth.  She has had no loss of hearing and no pain in the ear.  She has had no fever chills or altered mentation.  Patient was diagnosed with herpes zoster in the emergency department and infectious disease was consulted and they were able to view years of her lesions and recommended 24 to 48 hours of IV acyclovir treatment given immunocompromise status as patient is on daily dexamethasone and currently undergoing chemotherapy for glioblastoma.  Assessment & Plan:   Principal Problem:   Herpes zoster Active Problems:   Hyperlipidemia   Malignant hypertension   Colon adenomas   Brain mass   Right sided weakness   Steroid-induced hyperglycemia   Transaminitis   Glioblastoma (HCC)   Seizures (HCC)   Thrombocytopenia (HCC)   Multinodular goiter  1. Herpes zoster rash involving right side  of face with extensive lesions-patient is highly immunocompromise given active chemotherapy and daily dexamethasone use.  ID was consulted in the emergency department and recommended 24 to 48 hours of IV acyclovir which has been started.  Pt seems to be improving with this therapy.  Continue acyclovir IV for another 24 hours and reassess.  Airborne and contact precautions will be continued. 2. Glioblastoma-patient is currently undergoing chemotherapy.  Outpatient follow-up with heme-onc. 3. Seizures-patient has been seizure-free since being on Keppra 1000 mg twice daily which we will continue.  Seizure precautions 4. Hypertension-resume home medications and follow with IV labetalol ordered as needed for elevated blood pressure readings. 5. Hyperlipidemia resume home nightly statin therapy. 6. Hypokalemia - replacement ordered, recheck in AM.   DVT prophylaxis: Enoxaparin Code Status: Full Family Communication: Sister at bedside updated 12/25 Disposition Plan: Home when medically stable Consults called: Telephone infectious disease consult in the emergency department 12/25 Admission status: INP  Status is: Inpatient  Remains inpatient appropriate because:IV treatments appropriate due to intensity of illness or inability to take PO and Inpatient level of care appropriate due to severity of illness  Dispo: The patient is from: Home              Anticipated d/c is to: Home              Anticipated d/c date is: 1 day              Patient currently is not medically stable to d/c.  Consultants:  ID telephone call  Procedures:     Antimicrobials:  Acyclovir IV 12/25>>   Subjective: Pt reports that the burning sensations seems to be getting better.    Objective: Vitals:   05/22/20 1927 05/22/20 2214 05/22/20 2221 05/23/20 0359  BP: (!) 159/90 (!) 142/83  113/65  Pulse: (!) 106 (!) 105  87  Resp: 13 16  15   Temp:  98.1 F (36.7 C)  97.7 F (36.5 C)  TempSrc:      SpO2: 96%  98%  98%  Weight:   67.8 kg     Intake/Output Summary (Last 24 hours) at 05/23/2020 1140 Last data filed at 05/22/2020 1444 Gross per 24 hour  Intake 100 ml  Output --  Net 100 ml   Filed Weights   05/22/20 2221  Weight: 67.8 kg    Examination:  General exam: Appears calm and comfortable  Respiratory system: Clear to auscultation. Respiratory effort normal. Cardiovascular system: normal S1 & S2 heard. No JVD, murmurs, rubs, gallops or clicks. No pedal edema. Gastrointestinal system: Abdomen is nondistended, soft and nontender. No organomegaly or masses felt. Normal bowel sounds heard. Central nervous system: Alert and oriented. No focal neurological deficits. Extremities: Symmetric 5 x 5 power. Skin: herpes zoster lesions and edema slightly improved involving the right side of face.  Psychiatry: Judgement and insight appear normal. Mood & affect appropriate.   Data Reviewed: I have personally reviewed following labs and imaging studies  CBC: Recent Labs  Lab 05/18/20 0545 05/19/20 0859 05/20/20 0957 05/22/20 1243  WBC 3.2* 4.8 5.7 7.3  NEUTROABS  --   --  4.6 6.1  HGB 15.9* 17.0* 15.4* 14.6  HCT 46.7* 49.0* 44.0 41.7  MCV 90.3 88.1 86.6 88.3  PLT 84* 113* 114* 94*    Basic Metabolic Panel: Recent Labs  Lab 05/18/20 1624 05/19/20 0706 05/20/20 0957 05/22/20 1243  NA 141 139 140 135  K 4.1 4.3 4.2 4.3  CL 102 103 105 99  CO2 25 24 25 26   GLUCOSE 161* 117* 116* 84  BUN 38* 32* 23 19  CREATININE 1.05* 0.72 0.77 0.70  CALCIUM 9.2 8.9 9.1 8.5*  MG 2.4 2.2  --   --     GFR: Estimated Creatinine Clearance: 60.4 mL/min (by C-G formula based on SCr of 0.7 mg/dL).  Liver Function Tests: Recent Labs  Lab 05/18/20 1624 05/19/20 0706 05/20/20 0957 05/22/20 1243  AST 25 22 17  34  ALT 37 36 46* 49*  ALKPHOS 59 49 65 51  BILITOT 0.6 0.8 0.6 1.3*  PROT 6.6 5.8* 6.2* 5.9*  ALBUMIN 3.4* 3.2* 3.0* 3.1*    CBG: Recent Labs  Lab 05/17/20 0001 05/17/20 0328  05/17/20 0738 05/17/20 1152 05/17/20 1715  GLUCAP 125* 108* 105* 101* 122*    Recent Results (from the past 240 hour(s))  Resp Panel by RT-PCR (Flu A&B, Covid) Nasopharyngeal Swab     Status: None   Collection Time: 05/14/20  1:13 AM   Specimen: Nasopharyngeal Swab; Nasopharyngeal(NP) swabs in vial transport medium  Result Value Ref Range Status   SARS Coronavirus 2 by RT PCR NEGATIVE NEGATIVE Final    Comment: (NOTE) SARS-CoV-2 target nucleic acids are NOT DETECTED.  The SARS-CoV-2 RNA is generally detectable in upper respiratory specimens during the acute phase of infection. The lowest concentration of SARS-CoV-2 viral copies this assay can detect is 138 copies/mL. A negative result does not preclude SARS-Cov-2 infection and should not be used as the sole basis for treatment or  other patient management decisions. A negative result may occur with  improper specimen collection/handling, submission of specimen other than nasopharyngeal swab, presence of viral mutation(s) within the areas targeted by this assay, and inadequate number of viral copies(<138 copies/mL). A negative result must be combined with clinical observations, patient history, and epidemiological information. The expected result is Negative.  Fact Sheet for Patients:  EntrepreneurPulse.com.au  Fact Sheet for Healthcare Providers:  IncredibleEmployment.be  This test is no t yet approved or cleared by the [place] FDA and  has been authorized for detection and/or diagnosis of SARS-CoV-2 by FDA under an Emergency Use Authorization (EUA). This EUA will remain  in effect (meaning this test can be used) for the duration of the COVID-19 declaration under Section 564(b)(1) of the Act, 21 U.S.C.section 360bbb-3(b)(1), unless the authorization is terminated  or revoked sooner.       Influenza A by PCR NEGATIVE NEGATIVE Final   Influenza B by PCR NEGATIVE NEGATIVE Final     Comment: (NOTE) The Xpert Xpress SARS-CoV-2/FLU/RSV plus assay is intended as an aid in the diagnosis of influenza from Nasopharyngeal swab specimens and should not be used as a sole basis for treatment. Nasal washings and aspirates are unacceptable for Xpert Xpress SARS-CoV-2/FLU/RSV testing.  Fact Sheet for Patients: EntrepreneurPulse.com.au  Fact Sheet for Healthcare Providers: IncredibleEmployment.be  This test is not yet approved or cleared by the [place] FDA and has been authorized for detection and/or diagnosis of SARS-CoV-2 by FDA under an Emergency Use Authorization (EUA). This EUA will remain in effect (meaning this test can be used) for the duration of the COVID-19 declaration under Section 564(b)(1) of the Act, 21 U.S.C. section 360bbb-3(b)(1), unless the authorization is terminated or revoked.  Performed at [facility], [address] [place], [place] [address]   MRSA PCR Screening     Status: None   Collection Time: 05/14/20  6:57 AM   Specimen: Nasal Mucosa; Nasopharyngeal  Result Value Ref Range Status   MRSA by PCR NEGATIVE NEGATIVE Final    Comment:        The GeneXpert MRSA Assay (FDA approved for NASAL specimens only), is one component of a comprehensive MRSA colonization surveillance program. It is not intended to diagnose MRSA infection nor to guide or monitor treatment for MRSA infections. Performed at [facility], [place] [address], [place], [place] [address]   Resp Panel by RT-PCR (Flu A&B, Covid) Nasopharyngeal Swab     Status: None   Collection Time: 05/22/20 12:50 PM   Specimen: Nasopharyngeal Swab; Nasopharyngeal(NP) swabs in vial transport medium  Result Value Ref Range Status   SARS Coronavirus 2 by RT PCR NEGATIVE NEGATIVE Final    Comment: (NOTE) SARS-CoV-2 target nucleic acids are NOT DETECTED.  The SARS-CoV-2 RNA is generally detectable in upper respiratory specimens during  the acute phase of infection. The lowest concentration of SARS-CoV-2 viral copies this assay can detect is 138 copies/mL. A negative result does not preclude SARS-Cov-2 infection and should not be used as the sole basis for treatment or other patient management decisions. A negative result may occur with  improper specimen collection/handling, submission of specimen other than nasopharyngeal swab, presence of viral mutation(s) within the areas targeted by this assay, and inadequate number of viral copies(<138 copies/mL). A negative result must be combined with clinical observations, patient history, and epidemiological information. The expected result is Negative.  Fact Sheet for Patients:  EntrepreneurPulse.com.au  Fact Sheet for Healthcare Providers:  IncredibleEmployment.be  This test  is no t yet approved or cleared by the [place] and  has been authorized for detection and/or diagnosis of SARS-CoV-2 by FDA under an Emergency Use Authorization (EUA). This EUA will remain  in effect (meaning this test can be used) for the duration of the COVID-19 declaration under Section 564(b)(1) of the Act, 21 U.S.C.section 360bbb-3(b)(1), unless the authorization is terminated  or revoked sooner.       Influenza A by PCR NEGATIVE NEGATIVE Final   Influenza B by PCR NEGATIVE NEGATIVE Final    Comment: (NOTE) The Xpert Xpress SARS-CoV-2/FLU/RSV plus assay is intended as an aid in the diagnosis of influenza from Nasopharyngeal swab specimens and should not be used as a sole basis for treatment. Nasal washings and aspirates are unacceptable for Xpert Xpress SARS-CoV-2/FLU/RSV testing.  Fact Sheet for Patients: EntrepreneurPulse.com.au  Fact Sheet for Healthcare Providers: IncredibleEmployment.be  This test is not yet approved or cleared by the [place] FDA and has been authorized for detection and/or  diagnosis of SARS-CoV-2 by FDA under an Emergency Use Authorization (EUA). This EUA will remain in effect (meaning this test can be used) for the duration of the COVID-19 declaration under Section 564(b)(1) of the Act, 21 U.S.C. section 360bbb-3(b)(1), unless the authorization is terminated or revoked.  Performed at [facility], [address], [place], [place] [address]      Radiology Studies: No results found.   Scheduled Meds: . acetaminophen  650 mg Oral Q6H   Or  . acetaminophen  650 mg Rectal Q6H  . amLODipine  5 mg Oral Daily  . calcium carbonate  1 tablet Oral Once  . cloNIDine  0.3 mg Oral QHS  . dexamethasone  4 mg Oral Daily  . docusate sodium  100 mg Oral Once  . dorzolamide-timolol  1 drop Right Eye BID  . enoxaparin (LOVENOX) injection  40 mg Subcutaneous Q24H  . levETIRAcetam  1,000 mg Oral BID  . multivitamin with minerals  1 tablet Oral Once  . pravastatin  10 mg Oral QHS  . vitamin B-12  2,500 mcg Oral QHS   Continuous Infusions: . sodium chloride 50 mL/hr at 05/22/20 2226  . acyclovir 680 mg (05/23/20 0558)     LOS: 1 day   Time spent: 35 minutes   Shannara Winbush Wynetta Emery, MD How to contact the [facility] Attending or Consulting provider [place] or covering provider during after hours [place], for this patient?  1. Check the care team in [facility] and look for a) attending/consulting [facility] provider listed and b) the [facility] team listed 2. Log into [email or web address] and use [place]'s universal password to access. If you do not have the password, please contact the hospital operator. 3. Locate the [facility] provider you are looking for under [facility] and page to a number that you can be directly reached. 4. If you still have difficulty reaching the provider, please page the [facility] (Director on Call) for the Hospitalists listed on amion for assistance.  05/23/2020, 11:40 AM

## 2020-05-24 ENCOUNTER — Ambulatory Visit: Payer: Medicare PPO | Admitting: Neurology

## 2020-05-24 ENCOUNTER — Telehealth: Payer: Self-pay

## 2020-05-24 DIAGNOSIS — G9389 Other specified disorders of brain: Secondary | ICD-10-CM | POA: Diagnosis not present

## 2020-05-24 DIAGNOSIS — C719 Malignant neoplasm of brain, unspecified: Secondary | ICD-10-CM | POA: Diagnosis not present

## 2020-05-24 DIAGNOSIS — B029 Zoster without complications: Secondary | ICD-10-CM | POA: Diagnosis not present

## 2020-05-24 DIAGNOSIS — D126 Benign neoplasm of colon, unspecified: Secondary | ICD-10-CM | POA: Diagnosis not present

## 2020-05-24 LAB — BASIC METABOLIC PANEL
Anion gap: 8 (ref 5–15)
BUN: 21 mg/dL (ref 8–23)
CO2: 28 mmol/L (ref 22–32)
Calcium: 8.4 mg/dL — ABNORMAL LOW (ref 8.9–10.3)
Chloride: 102 mmol/L (ref 98–111)
Creatinine, Ser: 0.66 mg/dL (ref 0.44–1.00)
GFR, Estimated: >60 mL/min (ref >60)
Glucose, Bld: 86 mg/dL (ref 70–99)
Potassium: 3.8 mmol/L (ref 3.5–5.1)
Sodium: 138 mmol/L (ref 135–145)

## 2020-05-24 LAB — MAGNESIUM: Magnesium: 2 mg/dL (ref 1.7–2.4)

## 2020-05-24 NOTE — Progress Notes (Addendum)
PROGRESS NOTE   Wanda Ross  [id number] DOB: 18-Aug-1950 DOA: 05/22/2020 PCP: Fayrene Helper, MD   Chief Complaint  Patient presents with  . 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  She presented initially in late June 2021 with progressive symptoms described as gait impairment confusion and disorientation.  Her CNS imaging demonstrated a 4 to 5 cm mass in the left parietal region and she underwent craniotomy and resection by Dr. Marcello Moores on 11/26/2019.  She has since had residual right-sided weakness.  She lives with her sister.  She was recently hospitalized at [facility] for seizures.  She has been on Keppra 1000 mg twice daily and has been seizure-free since discharge.  After her recent discharge she began to experience swelling on the right side of the face that has significantly gotten worse.  She has developed blisters on the right side of the face that have been very painful.  No loss of vision.  No difficulty eating or drinking.  She also reports that she has had lesions inside of the mouth.  She has had no loss of hearing and no pain in the ear.  She has had no fever chills or altered mentation.  Patient was diagnosed with herpes zoster in the emergency department and infectious disease was consulted and they were able to view years of her lesions and recommended 24 to 48 hours of IV acyclovir treatment given immunocompromise status as patient is on daily dexamethasone and currently undergoing chemotherapy for glioblastoma.  Assessment & Plan:   Principal Problem:   Herpes zoster Active Problems:   Hyperlipidemia   Malignant hypertension   Colon adenomas   Brain mass   Right sided weakness   Steroid-induced hyperglycemia   Transaminitis   Glioblastoma (HCC)   Seizures (HCC)   Thrombocytopenia (HCC)   Multinodular goiter  1. Herpes zoster rash involving right side  of face with extensive lesions-patient is highly immunocompromised given active chemotherapy and daily dexamethasone use.  ID was consulted in the emergency department and initially recommended 24 to 48 hours of IV acyclovir which has been started.  Given her recent history of having seizures and that IV acyclovir can lower seizure threshold patient has been admitted so that she can be monitored for breakthrough seizure activity while on acyclovir.  Also, patient continues to have painful oral lesions in mouth and poor oral intake and I am continuing IV acyclovir for an additional 24 hours day and plan to discharge home tomorrow 12/28 if she is eating and drinking enough to meet daily fluid needs and avoid dehydration and rehospitalization.  Today she is not eating and drinking enough to sustain basic maintenance needs and is HIGH RISK for dehydration and rehospitalization if discharged today.  Airborne and contact precautions will be continued. 2. Glioblastoma-patient is currently undergoing chemotherapy.  Outpatient follow-up with heme-onc.  Family has arranged an outpatient palliative care consult with [facility].  3. Seizures-patient has been seizure-free since being on Keppra 1000 mg twice daily which we will continue.  Seizure precautions.  Given that acyclovir can lower seizure threshold we will monitor inpatient to be sure there is no breakthrough seizure activity.  4. Hypertension-resume home medications and follow with IV labetalol ordered as needed for elevated blood pressure readings. 5. Hyperlipidemia resume home nightly statin therapy. 6. Hypokalemia - replacement ordered, recheck in AM.   DVT prophylaxis: Enoxaparin Code Status: Full Family Communication:  Sister at bedside updated 12/25 Disposition Plan: Home when medically stable Consults called: Telephone infectious disease consult in the emergency department 12/25 Admission status: INP  Status is: Inpatient  Remains inpatient  appropriate because:IV treatments appropriate due to intensity of illness or inability to take PO and Inpatient level of care appropriate due to severity of illness  Dispo: The patient is from: Home              Anticipated d/c is to: Home              Anticipated d/c date is: 1 day              Patient currently is not medically stable to d/c.  Consultants:   ID telephone call  Procedures:     Antimicrobials:  Acyclovir IV 12/25>>   Subjective: Pt reports that the swelling on right side of face is getting better and lesions in mouth are less but still having pain when trying to eat or drink.    Objective: Vitals:   05/23/20 0359 05/23/20 1335 05/23/20 2056 05/24/20 0535  BP: 113/65 129/74 140/86 116/74  Pulse: 87 89 88 83  Resp: 15 16 18 18   Temp: 97.7 F (36.5 C) 99 F (37.2 C) 99.1 F (37.3 C) 98.7 F (37.1 C)  TempSrc:      SpO2: 98% 96% 97% 97%  Weight:        Intake/Output Summary (Last 24 hours) at 05/24/2020 1205 Last data filed at 05/24/2020 0800 Gross per 24 hour  Intake 2051.94 ml  Output --  Net 2051.94 ml   Filed Weights   05/22/20 2221  Weight: 67.8 kg   Examination:  General exam: Appears calm and comfortable  HEENT: diffuse herpetic lesions in mouth.  Respiratory system: Clear to auscultation. Respiratory effort normal. Cardiovascular system: normal S1 & S2 heard. No JVD, murmurs, rubs, gallops or clicks. No pedal edema. Gastrointestinal system: Abdomen is nondistended, soft and nontender. No organomegaly or masses felt. Normal bowel sounds heard. Central nervous system: Alert and oriented. No focal neurological deficits. Extremities: Symmetric 5 x 5 power. Skin: herpes zoster lesions and edema continues to improve involving the right side of face.  Psychiatry: Judgement and insight appear normal. Mood & affect appropriate.   Data Reviewed: I have personally reviewed following labs and imaging studies  CBC: Recent Labs  Lab  05/18/20 0545 05/19/20 0859 05/20/20 0957 05/22/20 1243  WBC 3.2* 4.8 5.7 7.3  NEUTROABS  --   --  4.6 6.1  HGB 15.9* 17.0* 15.4* 14.6  HCT 46.7* 49.0* 44.0 41.7  MCV 90.3 88.1 86.6 88.3  PLT 84* 113* 114* 94*   Basic Metabolic Panel: Recent Labs  Lab 05/18/20 1624 05/19/20 0706 05/20/20 0957 05/22/20 1243 05/23/20 1004 05/24/20 0722  NA 141 139 140 135 139 138  K 4.1 4.3 4.2 4.3 3.0* 3.8  CL 102 103 105 99 102 102  CO2 25 24 25 26 27 28   GLUCOSE 161* 117* 116* 84 157* 86  BUN 38* 32* 23 19 18 21   CREATININE 1.05* 0.72 0.77 0.70 0.71 0.66  CALCIUM 9.2 8.9 9.1 8.5* 8.2* 8.4*  MG 2.4 2.2  --   --  1.9 2.0    GFR: Estimated Creatinine Clearance: 60.4 mL/min (by C-G formula based on SCr of 0.66 mg/dL).  Liver Function Tests: Recent Labs  Lab 05/18/20 1624 05/19/20 0706 05/20/20 0957 05/22/20 1243 05/23/20 1004  AST 25 22 17  34 27  ALT 37 36  46* 49* 48*  ALKPHOS 59 49 65 51 47  BILITOT 0.6 0.8 0.6 1.3* 0.6  PROT 6.6 5.8* 6.2* 5.9* 5.1*  ALBUMIN 3.4* 3.2* 3.0* 3.1* 2.7*    CBG: Recent Labs  Lab 05/17/20 1715  GLUCAP 122*    Recent Results (from the past 240 hour(s))  Resp Panel by RT-PCR (Flu A&B, Covid) Nasopharyngeal Swab     Status: None   Collection Time: 05/22/20 12:50 PM   Specimen: Nasopharyngeal Swab; Nasopharyngeal(NP) swabs in vial transport medium  Result Value Ref Range Status   SARS Coronavirus 2 by RT PCR NEGATIVE NEGATIVE Final    Comment: (NOTE) SARS-CoV-2 target nucleic acids are NOT DETECTED.  The SARS-CoV-2 RNA is generally detectable in upper respiratory specimens during the acute phase of infection. The lowest concentration of SARS-CoV-2 viral copies this assay can detect is 138 copies/mL. A negative result does not preclude SARS-Cov-2 infection and should not be used as the sole basis for treatment or other patient management decisions. A negative result may occur with  improper specimen collection/handling, submission of  specimen other than nasopharyngeal swab, presence of viral mutation(s) within the areas targeted by this assay, and inadequate number of viral copies(<138 copies/mL). A negative result must be combined with clinical observations, patient history, and epidemiological information. The expected result is Negative.  Fact Sheet for Patients:  EntrepreneurPulse.com.au  Fact Sheet for Healthcare Providers:  IncredibleEmployment.be  This test is no t yet approved or cleared by the [place] FDA and  has been authorized for detection and/or diagnosis of SARS-CoV-2 by FDA under an Emergency Use Authorization (EUA). This EUA will remain  in effect (meaning this test can be used) for the duration of the COVID-19 declaration under Section 564(b)(1) of the Act, 21 U.S.C.section 360bbb-3(b)(1), unless the authorization is terminated  or revoked sooner.       Influenza A by PCR NEGATIVE NEGATIVE Final   Influenza B by PCR NEGATIVE NEGATIVE Final    Comment: (NOTE) The Xpert Xpress SARS-CoV-2/FLU/RSV plus assay is intended as an aid in the diagnosis of influenza from Nasopharyngeal swab specimens and should not be used as a sole basis for treatment. Nasal washings and aspirates are unacceptable for Xpert Xpress SARS-CoV-2/FLU/RSV testing.  Fact Sheet for Patients: EntrepreneurPulse.com.au  Fact Sheet for Healthcare Providers: IncredibleEmployment.be  This test is not yet approved or cleared by the [place] FDA and has been authorized for detection and/or diagnosis of SARS-CoV-2 by FDA under an Emergency Use Authorization (EUA). This EUA will remain in effect (meaning this test can be used) for the duration of the COVID-19 declaration under Section 564(b)(1) of the Act, 21 U.S.C. section 360bbb-3(b)(1), unless the authorization is terminated or revoked.  Performed at [facility], [address]  [place],  [address]     Radiology Studies: No results found.  Scheduled Meds: . acetaminophen  650 mg Oral Q6H   Or  . acetaminophen  650 mg Rectal Q6H  . amLODipine  5 mg Oral Daily  . calcium carbonate  1 tablet Oral Once  . cloNIDine  0.3 mg Oral QHS  . dexamethasone  4 mg Oral Daily  . docusate sodium  100 mg Oral Once  . dorzolamide-timolol  1 drop Right Eye BID  . enoxaparin (LOVENOX) injection  40 mg Subcutaneous Q24H  . levETIRAcetam  1,000 mg Oral BID  . multivitamin with minerals  1 tablet Oral Once  . pravastatin  10 mg Oral QHS  . vitamin B-12  2,500  mcg Oral QHS   Continuous Infusions: . sodium chloride 35 mL/hr at 05/23/20 2123  . acyclovir 680 mg (05/23/20 2122)    LOS: 2 days   Time spent: 35 minutes   Mujtaba Bollig Laural Benes, MD How to contact the [facility] Attending or Consulting provider 7A - 7P or covering provider during after hours 7P -7A, for this patient?  1. Check the care team in [facility] and look for a) attending/consulting TRH provider listed and b) the [facility] team listed 2. Log into [email or web address] and use [place]'s universal password to access. If you do not have the password, please contact the hospital operator. 3. Locate the [facility] provider you are looking for under [facility] and page to a number that you can be directly reached. 4. If you still have difficulty reaching the provider, please page the [facility] (Director on Call) for the Hospitalists listed on amion for assistance.  05/24/2020, 12:05 PM

## 2020-05-24 NOTE — Progress Notes (Signed)
[facility]) Outpatient Palliative Care  Wanda Ross has been referred to our community based palliative care services. She was admitted prior to enrolling with our services.  [facility] will follow along for discharge and establish palliative services once she is discharged.  Wallis Bamberg RN, BSN, CCRN [facility] Liaison

## 2020-05-24 NOTE — Telephone Encounter (Signed)
Spoke with patient's daughter [name] to schedule an in-person Palliative Consult. Patient was admitted to [facility] on 12/25. Dx is Herpes Zoster. Will notify hospital liaison team.

## 2020-05-24 NOTE — TOC Initial Note (Signed)
Transition of Care [facility]) - Initial/Assessment Note    Patient Details  Name: Wanda Ross MRN: [id number] Date of Birth: [birth year]  Transition of Care [facility]) CM/SW Contact:    Barry Brunner, LCSW Phone Number: 05/24/2020, 5:12 PM  Clinical Narrative:                 Patient is a 69 year old female admitted for herpes zoster. CSW observed patient's high readmission risk score and conducted readmission risk assessment. CSW also completed initial assessment. Patient's daughter reported that she has regular care from her family members who rotate with providing supervision. Patient's daughter reported that patient is able to complete her ADL's independently for majority of the time, but has recently been experiencing and great increase in weakness. Patient's daughter agreeable to [facility] referral. CSW referred patient to Denyse Amass with [facility] for [facility] services. TOC to follow.   Expected Discharge Plan: Home w Home Health Services Barriers to Discharge: Continued Medical Work up   Patient Goals and CMS Choice Patient states their goals for this hospitalization and ongoing recovery are:: return home w/ SNF CMS Medicare.gov Compare Post Acute Care list provided to:: Patient Choice offered to / list presented to : Patient,Adult Children  Expected Discharge Plan and Services Expected Discharge Plan: Home w Home Health Services In-house Referral: NA Discharge Planning Services: NA Post Acute Care Choice: Home Health Living arrangements for the past 2 months: Single Family Home                 DME Arranged: N/A DME Agency: NA       HH Arranged: PT,RN,Nurse's Aide HH AgencyHotel manager Home Health Care Date [facility] Agency Contacted: 05/24/20 Time HH Agency Contacted: 1606 Representative spoke with at [facility] Agency: Denyse Amass  Prior Living Arrangements/Services Living arrangements for the past 2 months: Single Family Home Lives with:: Self,Other (Comment) (Adult children and siblings rotate supervision of  patient in home) Patient language and need for interpreter reviewed:: Yes        Need for Family Participation in Patient Care: Yes (Comment) Care giver support system in place?: Yes (comment) Current home services: DME Criminal Activity/Legal Involvement Pertinent to Current Situation/Hospitalization: No - Comment as needed  Activities of Daily Living Home Assistive Devices/Equipment: Walker (specify type) ADL Screening (condition at time of admission) Patient's cognitive ability adequate to safely complete daily activities?: Yes Is the patient deaf or have difficulty hearing?: No Does the patient have difficulty seeing, even when wearing glasses/contacts?: No Does the patient have difficulty concentrating, remembering, or making decisions?: Yes Patient able to express need for assistance with ADLs?: No Does the patient have difficulty dressing or bathing?: Yes Independently performs ADLs?: Yes (appropriate for developmental age) Does the patient have difficulty walking or climbing stairs?: Yes Weakness of Legs: Both Weakness of Arms/Hands: None  Permission Sought/Granted Permission sought to share information with : Family Supports Permission granted to share information with : Yes, Verbal Permission Granted  Share Information with NAME: Duguay,[name]  Permission granted to share info w AGENCY: Local HH  Permission granted to share info w Relationship: Daughter  Permission granted to share info w Contact Information: [phone]  Emotional Assessment       Orientation: : Oriented to Self,Oriented to Place,Oriented to  Time,Oriented to Situation Alcohol / Substance Use: Not Applicable Psych Involvement: No (comment)  Admission diagnosis:  Herpes zoster [B02.9] Herpes zoster with complication [B02.8] Patient Active Problem List   Diagnosis Date Noted  . Herpes zoster 05/22/2020  .  Acute respiratory failure with hypoxemia (HCC) 05/14/2020  . Seizures (HCC) 05/14/2020  .  Thrombocytopenia (HCC) 05/14/2020  . Lactic acidosis 05/14/2020  . Hypokalemia 05/14/2020  . Hyperglycemia 05/14/2020  . Pressure injury of skin 05/14/2020  . Goals of care, counseling/discussion 12/15/2019  . Glioblastoma (HCC)   . Hypoalbuminemia due to protein-calorie malnutrition (HCC)   . Transaminitis   . Benign essential HTN   . Prediabetes   . Seizure prophylaxis   . Glioma (HCC) 12/02/2019  . Right sided weakness   . Dyslipidemia   . Essential hypertension   . Steroid-induced hyperglycemia   . Leucocytosis   . Brain mass 11/22/2019  . 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 Abnormal electrocardiogram (ECG) (EKG) 12/31/2017  . White coat syndrome with diagnosis of hypertension 12/31/2017  . Glaucoma 12/26/2013  . Vitamin D deficiency 05/27/2009  . IMPAIRED GLUCOSE TOLERANCE 05/27/2009  . OSTEOPENIA 05/27/2009  . Multinodular goiter 2010  . Multiple thyroid nodules 11/06/2007  . Hyperlipidemia 11/06/2007  . 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 [place] [address] Phone: [phone] Fax: [phone]     Social Determinants of Health (SDOH) Interventions    Readmission Risk Interventions Readmission Risk Prevention Plan 05/24/2020  Transportation Screening Complete  PCP or Specialist Appt within 3-5 Days Complete  HRI or Home Care Consult Complete  Social Work Consult for Recovery Care Planning/Counseling Complete  Palliative Care Screening Complete  Medication Review Oceanographer) Complete  Some recent data might be hidden

## 2020-05-25 DIAGNOSIS — D126 Benign neoplasm of colon, unspecified: Secondary | ICD-10-CM | POA: Diagnosis not present

## 2020-05-25 DIAGNOSIS — B029 Zoster without complications: Secondary | ICD-10-CM | POA: Diagnosis not present

## 2020-05-25 DIAGNOSIS — E782 Mixed hyperlipidemia: Secondary | ICD-10-CM | POA: Diagnosis not present

## 2020-05-25 DIAGNOSIS — C719 Malignant neoplasm of brain, unspecified: Secondary | ICD-10-CM | POA: Diagnosis not present

## 2020-05-25 MED ORDER — VALACYCLOVIR HCL 1 G PO TABS: 1000.0000 mg | ORAL_TABLET | Freq: Three times a day (TID) | ORAL | 0 refills | Status: AC

## 2020-05-25 NOTE — Discharge Summary (Signed)
Physician Discharge Summary  Wanda Wanda [id number] DOB: 05/25/1951 DOA: 05/22/2020  PCP: Fayrene Helper, MD  Admit date: 05/22/2020 Discharge date: 05/25/2020  Admitted From:  Home  Disposition: Home with [facility]  Recommendations Outpatient palliative care with [facility]  Home Health:  RN, PT   Discharge Condition: STABLE   CODE STATUS: FULL    Brief Hospitalization Summary: Please see all hospital notes, images, labs for full details of the hospitalization. ADMISSION HPI: Wanda Wanda is a 69 y.o. 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  She presented initially in late June 2021 with progressive symptoms described as gait impairment confusion and disorientation.  Her CNS imaging demonstrated a 4 to 5 cm mass in the left parietal region and she underwent craniotomy and resection by Dr. Marcello Moores on 11/26/2019.  She has since had residual right-sided weakness.  She lives with her sister.  She was recently hospitalized at [facility] for seizures.  She has been on Keppra 1000 mg twice daily and has been seizure-free since discharge.  After her recent discharge she began to experience swelling on the right side of the face that has significantly gotten worse.  She has developed blisters on the right side of the face that have been very painful.  No loss of vision.  No difficulty eating or drinking.  She also reports that she has had lesions inside of the mouth.  She has had no loss of hearing and no pain in the ear.  She has had no fever chills or altered mentation.  Patient was diagnosed with herpes zoster in the emergency department and infectious disease was consulted and they were able to view years of her lesions and recommended 24 to 48 hours of IV acyclovir treatment given immunocompromise status as patient is on daily dexamethasone and currently undergoing chemotherapy for glioblastoma.  Hospital Course  This patient was  admitted with a severe case of facial herpes zoster involving the right eye which was nearly completely closed due to severe edema involving the right side of the face.  She also had severe herpetic stomatitis in the mouth that affected her eating and drinking to the point where she was not able to eat or drink enough to sustain her needs.  She was placed on IV acyclovir and was monitored in the hospital given her recent hospitalization for seizure activities.  We wanted to be sure that because acyclovir could potentially lower the seizure threshold and she would not have further seizures.  She continued to have difficulty with mouth pain eating and drinking and we continued IV acyclovir until 05/25/2020 when she demonstrated that she was able to eat and drink enough to maintain her basic hydration and nutrition needs and avoid rehospitalization.  Now she will discharge home on oral Valtrex 1000 mg 3 times daily for additional 5 days to complete course of therapy.  At this time she has no difficulty with vision and she is able to eat and drink.  Close outpatient follow-up with her oncologist care provider recommended.   Discharge Diagnoses:  Principal Problem:   Herpes zoster Active Problems:   Hyperlipidemia   Malignant hypertension   Colon adenomas   Brain mass   Right sided weakness   Steroid-induced hyperglycemia   Transaminitis   Glioblastoma (HCC)   Seizures (HCC)   Thrombocytopenia (HCC)   Multinodular goiter   Discharge Instructions:  Allergies as of 05/25/2020   No Known Allergies     Medication List  STOP taking these medications   docusate sodium 100 MG capsule Commonly known as: COLACE     TAKE these medications   acetaminophen 325 MG tablet Commonly known as: TYLENOL Take 2 tablets (650 mg total) by mouth every 4 (four) hours as needed for mild pain (temp > 100.5).   alendronate 70 MG tablet Commonly known as: FOSAMAX Take 1 tablet by mouth every Sunday.    amLODipine 5 MG tablet Commonly known as: NORVASC Take 1 tablet (5 mg total) by mouth daily.   Calcium 1200 1200-1000 MG-UNIT Chew Chew 1,200 mg by mouth daily.   Centrum Silver Ultra Womens Tabs Take 1 tablet by mouth daily.   cloNIDine 0.3 MG tablet Commonly known as: CATAPRES TAKE 1 TABLET BY MOUTH EVERYDAY AT BEDTIME What changed:   how much to take  how to take this  when to take this  additional instructions   Cyanocobalamin 2500 MCG Tabs Take 2,500 mcg by mouth daily. What changed: when to take this   dexamethasone 4 MG tablet Commonly known as: DECADRON Take 4 mg by mouth daily.   dorzolamide-timolol 22.3-6.8 MG/ML ophthalmic solution Commonly known as: COSOPT Place 1 drop into the right eye 2 (two) times daily.   levETIRAcetam 1000 MG tablet Commonly known as: KEPPRA Take 1 tablet (1,000 mg total) by mouth 2 (two) times daily.   ondansetron 8 MG tablet Commonly known as: Zofran Take 1 tablet (8 mg total) by mouth 2 (two) times daily as needed (nausea and vomiting). May take 30-60 minutes prior to Temodar administration if nausea/vomiting occurs.   pravastatin 10 MG tablet Commonly known as: PRAVACHOL Take 1 tablet (10 mg total) by mouth at bedtime.   valACYclovir 1000 MG tablet Commonly known as: VALTREX Take 1 tablet (1,000 mg total) by mouth 3 (three) times daily for 5 days.       Follow-up Information    Fayrene Helper, MD. Go to.   Specialty: Family Medicine Why: May 26, 2020 2:00pm Contact information: [address] [place] [address] [phone]        Care, [facility] Follow up.   Specialty: Home Health Services Why: PT, RN. Aid Contact information: [place] [address] [phone]              No Known Allergies Allergies as of 05/25/2020   No Known Allergies     Medication List    STOP taking these medications   docusate sodium 100 MG  capsule Commonly known as: COLACE     TAKE these medications   acetaminophen 325 MG tablet Commonly known as: TYLENOL Take 2 tablets (650 mg total) by mouth every 4 (four) hours as needed for mild pain (temp > 100.5).   alendronate 70 MG tablet Commonly known as: FOSAMAX Take 1 tablet by mouth every Sunday.   amLODipine 5 MG tablet Commonly known as: NORVASC Take 1 tablet (5 mg total) by mouth daily.   Calcium 1200 1200-1000 MG-UNIT Chew Chew 1,200 mg by mouth daily.   Centrum Silver Ultra Womens Tabs Take 1 tablet by mouth daily.   cloNIDine 0.3 MG tablet Commonly known as: CATAPRES TAKE 1 TABLET BY MOUTH EVERYDAY AT BEDTIME What changed:   how much to take  how to take this  when to take this  additional instructions   Cyanocobalamin 2500 MCG Tabs Take 2,500 mcg by mouth daily. What changed: when to take this   dexamethasone 4 MG tablet  Commonly known as: DECADRON Take 4 mg by mouth daily.   dorzolamide-timolol 22.3-6.8 MG/ML ophthalmic solution Commonly known as: COSOPT Place 1 drop into the right eye 2 (two) times daily.   levETIRAcetam 1000 MG tablet Commonly known as: KEPPRA Take 1 tablet (1,000 mg total) by mouth 2 (two) times daily.   ondansetron 8 MG tablet Commonly known as: Zofran Take 1 tablet (8 mg total) by mouth 2 (two) times daily as needed (nausea and vomiting). May take 30-60 minutes prior to Temodar administration if nausea/vomiting occurs.   pravastatin 10 MG tablet Commonly known as: PRAVACHOL Take 1 tablet (10 mg total) by mouth at bedtime.   valACYclovir 1000 MG tablet Commonly known as: VALTREX Take 1 tablet (1,000 mg total) by mouth 3 (three) times daily for 5 days.       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 EEG to evaluate for seizure. Level of alertness:  comatose AEDs during EEG study: LEV, propofol Technical aspects: This EEG study was done with scalp electrodes positioned according to the 10-20 International system of electrode placement. Electrical activity was acquired at a sampling rate of 500Hz  and reviewed with a high frequency filter of 70Hz  and a low frequency filter of 1Hz . EEG data were recorded continuously and digitally stored. Description: EEG showed continuous generalized polymorphic frequencies with predominantly 3 to 6 Hz theta-delta slowing as well as 15-18hz  generalized beta activity. Hyperventilation and photic stimulation were not performed.   ABNORMALITY -Continuous slow, generalized IMPRESSION: This study is suggestive of severe diffuse encephalopathy, nonspecific etiology but likely related to sedation. No seizures or epileptiform discharges were seen throughout the recording. Lora Havens   CT Head Wo Contrast  Result Date: 05/14/2020 CLINICAL DATA:  Seizure EXAM: CT HEAD WITHOUT CONTRAST TECHNIQUE: Contiguous axial images were obtained from the base of the skull through the vertex without intravenous contrast. COMPARISON:  11/22/2019 FINDINGS: Brain: Postoperative changes of prior brain tumor resection centered at the paramedian left parietal lobe and corpus callosum splenium. No intracranial hemorrhage or extra-axial collection. No acute abnormality. Vascular: No abnormal hyperdensity of the major intracranial arteries or dural venous sinuses. No intracranial atherosclerosis. Skull: Remote left parietal craniotomy. Sinuses/Orbits: No fluid levels or advanced mucosal thickening of the visualized paranasal sinuses. No mastoid or middle ear effusion. The orbits are normal. IMPRESSION: 1. No acute intracranial abnormality. 2. Postoperative changes of prior brain tumor resection. Electronically Signed   By:  Ulyses Jarred M.D.   On: 05/14/2020 03:03   MR BRAIN W WO CONTRAST  Result Date: 05/14/2020 CLINICAL DATA:  Seizure.  History of glioblastoma. EXAM: MRI HEAD WITHOUT AND WITH CONTRAST TECHNIQUE: Multiplanar, multiecho pulse sequences of the brain and surrounding structures were obtained without and with intravenous contrast. CONTRAST:  72mL GADAVIST GADOBUTROL 1 MMOL/ML IV SOLN COMPARISON:  Head CT same day.  MRI 03-30-2020. FINDINGS: Brain: Pronounced favorable changes since the study of October. The brainstem and cerebellum remain normal. There is less T2 and FLAIR signal within the white matter within the posterior aspects of both cerebral hemispheres. Enhancing tissue along the margins of the previous resection has markedly involuted. There is a 9 mm enhancing nodule of tissue adjacent to the midline which is a new finding since the previous  study. Post resection space appears similar to the previous study. No new areas of increasing mass effect or contrast enhancement seen otherwise. No hydrocephalus. No extra-axial fluid collection. No distant disease manifestation. Vascular: Major vessels at the base of the brain show flow. Skull and upper cervical spine: Left parietal craniotomy as seen previously. Sinuses/Orbits: Clear/normal Other: None IMPRESSION: Favorable evolutionary changes since the study of October. Previously seen enhancement along the margins of the previous resection has markedly involuted. The single exception is that there is a new 9 mm enhancing nodule of tissue adjacent to the midline which could represent a focus of tumor progression within the largely regressing lesion. Some regression of T2 and FLAIR signal as well within the white matter of the posterior brain. Electronically Signed   By: Nelson Chimes M.D.   On: 05/14/2020 20:01   DG Chest Port 1 View  Result Date: 05/15/2020 CLINICAL DATA:  Acute respiratory failure with hypoxemia. EXAM: PORTABLE CHEST 1 VIEW COMPARISON:   May 14, 2020. FINDINGS: Endotracheal tube tip at the level of the inferior clavicular heads. Gastric tube courses below the diaphragm outside the field of view. Similar streaky left basilar opacities, likely atelectasis. No confluent consolidation. No visible pleural effusions or pneumothorax. IMPRESSION: 1. Left basilar atelectasis. 2. Stable support devices. Electronically Signed   By: Margaretha Sheffield MD   On: 05/15/2020 08:07   DG Chest Port 1 View  Result Date: 05/14/2020 CLINICAL DATA:  Post intubation EXAM: PORTABLE CHEST 1 VIEW COMPARISON:  11/22/2019 FINDINGS: Endotracheal tube is 2 cm above the carina. NG tube is in the stomach. Heart is normal size. Left basilar opacity, likely atelectasis. Right lung clear. No effusions or pneumothorax. IMPRESSION: Support devices as above. Left base atelectasis. Electronically Signed   By: Rolm Baptise M.D.   On: 05/14/2020 01:41      Subjective: Pt has been able to eat better and mouth pain is getting better and patient is drinking better.  Facial swelling and swollen right eye is much improved.  Discharge Exam: Vitals:   05/24/20 2050 05/25/20 0643  BP: 140/72 107/68  Pulse: 85 91  Resp: 18 18  Temp: 98.1 F (36.7 C) 98.2 F (36.8 C)  SpO2: 98% 98%   Vitals:   05/24/20 1328 05/24/20 2020 05/24/20 2050 05/25/20 0643  BP: 129/78  140/72 107/68  Pulse: 94  85 91  Resp: 17  18 18   Temp: 99.2 F (37.3 C)  98.1 F (36.7 C) 98.2 F (36.8 C)  TempSrc: Oral     SpO2: 96% 95% 98% 98%  Weight:        General exam: Appears calm and comfortable  HEENT: diffuse herpetic lesions in mouth markedly improved.  Respiratory system: Clear to auscultation. Respiratory effort normal. Cardiovascular system: normal S1 & S2 heard. No JVD, murmurs, rubs, gallops or clicks. No pedal edema. Gastrointestinal system: Abdomen is nondistended, soft and nontender. No organomegaly or masses felt. Normal bowel sounds heard. Central nervous system: Alert  and oriented. No focal neurological deficits. Extremities: Symmetric 5 x 5 power. Skin: herpes zoster lesions and edema improving and blisters crusted over involving the right side of face.  Psychiatry: Judgement and insight appear normal. Mood & affect appropriate.     The results of significant diagnostics from this hospitalization (including imaging, microbiology, ancillary and laboratory) are listed below for reference.     Microbiology: Recent Results (from the past 240 hour(s))  Resp Panel by RT-PCR (Flu A&B, Covid) Nasopharyngeal Swab  Status: None   Collection Time: 05/22/20 12:50 PM   Specimen: Nasopharyngeal Swab; Nasopharyngeal(NP) swabs in vial transport medium  Result Value Ref Range Status   SARS Coronavirus 2 by RT PCR NEGATIVE NEGATIVE Final    Comment: (NOTE) SARS-CoV-2 target nucleic acids are NOT DETECTED.  The SARS-CoV-2 RNA is generally detectable in upper respiratory specimens during the acute phase of infection. The lowest concentration of SARS-CoV-2 viral copies this assay can detect is 138 copies/mL. A negative result does not preclude SARS-Cov-2 infection and should not be used as the sole basis for treatment or other patient management decisions. A negative result may occur with  improper specimen collection/handling, submission of specimen other than nasopharyngeal swab, presence of viral mutation(s) within the areas targeted by this assay, and inadequate number of viral copies(<138 copies/mL). A negative result must be combined with clinical observations, patient history, and epidemiological information. The expected result is Negative.  Fact Sheet for Patients:  EntrepreneurPulse.com.au  Fact Sheet for Healthcare Providers:  IncredibleEmployment.be  This test is no t yet approved or cleared by the [place] FDA and  has been authorized for detection and/or diagnosis of SARS-CoV-2 by FDA under an Emergency  Use Authorization (EUA). This EUA will remain  in effect (meaning this test can be used) for the duration of the COVID-19 declaration under Section 564(b)(1) of the Act, 21 U.S.C.section 360bbb-3(b)(1), unless the authorization is terminated  or revoked sooner.       Influenza A by PCR NEGATIVE NEGATIVE Final   Influenza B by PCR NEGATIVE NEGATIVE Final    Comment: (NOTE) The Xpert Xpress SARS-CoV-2/FLU/RSV plus assay is intended as an aid in the diagnosis of influenza from Nasopharyngeal swab specimens and should not be used as a sole basis for treatment. Nasal washings and aspirates are unacceptable for Xpert Xpress SARS-CoV-2/FLU/RSV testing.  Fact Sheet for Patients: EntrepreneurPulse.com.au  Fact Sheet for Healthcare Providers: IncredibleEmployment.be  This test is not yet approved or cleared by the [place] FDA and has been authorized for detection and/or diagnosis of SARS-CoV-2 by FDA under an Emergency Use Authorization (EUA). This EUA will remain in effect (meaning this test can be used) for the duration of the COVID-19 declaration under Section 564(b)(1) of the Act, 21 U.S.C. section 360bbb-3(b)(1), unless the authorization is terminated or revoked.  Performed at [facility], [address] [place], [place] [address]      Labs: BNP (last 3 results) No results for input(s): BNP in the last 8760 hours. Basic Metabolic Panel: Recent Labs  Lab 05/18/20 1624 05/19/20 0706 05/20/20 0957 05/22/20 1243 05/23/20 1004 05/24/20 0722  NA 141 139 140 135 139 138  K 4.1 4.3 4.2 4.3 3.0* 3.8  CL 102 103 105 99 102 102  CO2 25 24 25 26 27 28   GLUCOSE 161* 117* 116* 84 157* 86  BUN 38* 32* 23 19 18 21   CREATININE 1.05* 0.72 0.77 0.70 0.71 0.66  CALCIUM 9.2 8.9 9.1 8.5* 8.2* 8.4*  MG 2.4 2.2  --   --  1.9 2.0   Liver Function Tests: Recent Labs  Lab 05/18/20 1624 05/19/20 0706 05/20/20 0957 05/22/20 1243 05/23/20 1004   AST 25 22 17  34 27  ALT 37 36 46* 49* 48*  ALKPHOS 59 49 65 51 47  BILITOT 0.6 0.8 0.6 1.3* 0.6  PROT 6.6 5.8* 6.2* 5.9* 5.1*  ALBUMIN 3.4* 3.2* 3.0* 3.1* 2.7*   No results for input(s): LIPASE, AMYLASE in the last 168 hours. No results  for input(s): AMMONIA in the last 168 hours. CBC: Recent Labs  Lab 05/19/20 0859 05/20/20 0957 05/22/20 1243  WBC 4.8 5.7 7.3  NEUTROABS  --  4.6 6.1  HGB 17.0* 15.4* 14.6  HCT 49.0* 44.0 41.7  MCV 88.1 86.6 88.3  PLT 113* 114* 94*   Cardiac Enzymes: No results for input(s): CKTOTAL, CKMB, CKMBINDEX, TROPONINI in the last 168 hours. BNP: Invalid input(s): POCBNP CBG: No results for input(s): GLUCAP in the last 168 hours. D-Dimer No results for input(s): DDIMER in the last 72 hours. Hgb A1c No results for input(s): HGBA1C in the last 72 hours. Lipid Profile No results for input(s): CHOL, HDL, LDLCALC, TRIG, CHOLHDL, LDLDIRECT in the last 72 hours. Thyroid function studies No results for input(s): TSH, T4TOTAL, T3FREE, THYROIDAB in the last 72 hours.  Invalid input(s): FREET3 Anemia work up No results for input(s): VITAMINB12, FOLATE, FERRITIN, TIBC, IRON, RETICCTPCT in the last 72 hours. Urinalysis    Component Value Date/Time   COLORURINE YELLOW 05/14/2020 0115   APPEARANCEUR HAZY (A) 05/14/2020 0115   LABSPEC 1.018 05/14/2020 0115   PHURINE 5.0 05/14/2020 0115   GLUCOSEU >=500 (A) 05/14/2020 0115   HGBUR NEGATIVE 05/14/2020 0115   BILIRUBINUR NEGATIVE 05/14/2020 0115   KETONESUR NEGATIVE 05/14/2020 0115   PROTEINUR 100 (A) 05/14/2020 0115   NITRITE NEGATIVE 05/14/2020 0115   LEUKOCYTESUR NEGATIVE 05/14/2020 0115   Sepsis Labs Invalid input(s): PROCALCITONIN,  WBC,  LACTICIDVEN Microbiology Recent Results (from the past 240 hour(s))  Resp Panel by RT-PCR (Flu A&B, Covid) Nasopharyngeal Swab     Status: None   Collection Time: 05/22/20 12:50 PM   Specimen: Nasopharyngeal Swab; Nasopharyngeal(NP) swabs in vial transport  medium  Result Value Ref Range Status   SARS Coronavirus 2 by RT PCR NEGATIVE NEGATIVE Final    Comment: (NOTE) SARS-CoV-2 target nucleic acids are NOT DETECTED.  The SARS-CoV-2 RNA is generally detectable in upper respiratory specimens during the acute phase of infection. The lowest concentration of SARS-CoV-2 viral copies this assay can detect is 138 copies/mL. A negative result does not preclude SARS-Cov-2 infection and should not be used as the sole basis for treatment or other patient management decisions. A negative result may occur with  improper specimen collection/handling, submission of specimen other than nasopharyngeal swab, presence of viral mutation(s) within the areas targeted by this assay, and inadequate number of viral copies(<138 copies/mL). A negative result must be combined with clinical observations, patient history, and epidemiological information. The expected result is Negative.  Fact Sheet for Patients:  BloggerCourse.com  Fact Sheet for Healthcare Providers:  SeriousBroker.it  This test is no t yet approved or cleared by the [place] FDA and  has been authorized for detection and/or diagnosis of SARS-CoV-2 by FDA under an Emergency Use Authorization (EUA). This EUA will remain  in effect (meaning this test can be used) for the duration of the COVID-19 declaration under Section 564(b)(1) of the Act, 21 U.S.C.section 360bbb-3(b)(1), unless the authorization is terminated  or revoked sooner.       Influenza A by PCR NEGATIVE NEGATIVE Final   Influenza B by PCR NEGATIVE NEGATIVE Final    Comment: (NOTE) The Xpert Xpress SARS-CoV-2/FLU/RSV plus assay is intended as an aid in the diagnosis of influenza from Nasopharyngeal swab specimens and should not be used as a sole basis for treatment. Nasal washings and aspirates are unacceptable for Xpert Xpress SARS-CoV-2/FLU/RSV testing.  Fact Sheet for  Patients: BloggerCourse.com  Fact Sheet for Healthcare Providers: SeriousBroker.it  This test is not yet approved or cleared by the [place] and has been authorized for detection and/or diagnosis of SARS-CoV-2 by FDA under an Emergency Use Authorization (EUA). This EUA will remain in effect (meaning this test can be used) for the duration of the COVID-19 declaration under Section 564(b)(1) of the Act, 21 U.S.C. section 360bbb-3(b)(1), unless the authorization is terminated or revoked.  Performed at [facility], [address], [place], [place] [address]     Time coordinating discharge:   SIGNED:  Irwin Brakeman, MD  [facility] 05/25/2020, 1:11 PM How to contact the [facility] Attending or Consulting provider [place] or covering provider during after hours [place], for this patient?  1. Check the care team in [facility] and look for a) attending/consulting [facility] provider listed and b) the [facility] team listed 2. Log into [email or web address] and use [facility]'s universal password to access. If you do not have the password, please contact the hospital operator. 3. Locate the [facility] provider you are looking for under [facility] and page to a number that you can be directly reached. 4. If you still have difficulty reaching the provider, please page the [facility] (Director on Call) for the Hospitalists listed on amion for assistance.

## 2020-05-25 NOTE — Care Management Important Message (Signed)
Important Message  Patient Details  Name: MIEL WISENER MRN: [id number] Date of Birth: 04/21/1951   Medicare Important Message Given:  Yes - Important Message mailed due to current National Emergency     Corey Harold 05/25/2020, 11:51 AM

## 2020-05-25 NOTE — Discharge Instructions (Signed)
Shingles  Shingles is an infection. It gives you a painful skin rash and blisters that have fluid in them. Shingles is caused by the same germ (virus) that causes chickenpox. Shingles only happens in people who:  Have had chickenpox.  Have been given a shot of medicine (vaccine) to protect against chickenpox. Shingles is rare in this group. The first symptoms of shingles may be itching, tingling, or pain in an area on your skin. A rash will show on your skin a few days or weeks later. The rash is likely to be on one side of your body. The rash usually has a shape like a belt or a band. Over time, the rash turns into fluid-filled blisters. The blisters will break open, change into scabs, and dry up. Medicines may:  Help with pain and itching.  Help you get better sooner.  Help to prevent long-term problems. Follow these instructions at home: Medicines  Take over-the-counter and prescription medicines only as told by your doctor.  Put on an anti-itch cream or numbing cream where you have a rash, blisters, or scabs. Do this as told by your doctor. Helping with itching and discomfort   Put cold, wet cloths (cold compresses) on the area of the rash or blisters as told by your doctor.  Cool baths can help you feel better. Try adding baking soda or dry oatmeal to the water to lessen itching. Do not bathe in hot water. Blister and rash care  Keep your rash covered with a loose bandage (dressing).  Wear loose clothing that does not rub on your rash.  Keep your rash and blisters clean. To do this, wash the area with mild soap and cool water as told by your doctor.  Check your rash every day for signs of infection. Check for: ? More redness, swelling, or pain. ? Fluid or blood. ? Warmth. ? Pus or a bad smell.  Do not scratch your rash. Do not pick at your blisters. To help you to not scratch: ? Keep your fingernails clean and cut short. ? Wear gloves or mittens when you sleep, if  scratching is a problem. General instructions  Rest as told by your doctor.  Keep all follow-up visits as told by your doctor. This is important.  Wash your hands often with soap and water. If soap and water are not available, use hand sanitizer. Doing this lowers your chance of getting a skin infection caused by germs (bacteria).  Your infection can cause chickenpox in people who have never had chickenpox or never got a shot of chickenpox vaccine. If you have blisters that did not change into scabs yet, try not to touch other people or be around other people, especially: ? Babies. ? Pregnant women. ? Children who have areas of red, itchy, or rough skin (eczema). ? Very old people who have transplants. ? People who have a long-term (chronic) sickness, like cancer or AIDS. Contact a doctor if:  Your pain does not get better with medicine.  Your pain does not get better after the rash heals.  You have any signs of infection in the rash area. These signs include: ? More redness, swelling, or pain around the rash. ? Fluid or blood coming from the rash. ? The rash area feeling warm to the touch. ? Pus or a bad smell coming from the rash. Get help right away if:  The rash is on your face or nose.  You have pain in your face or pain by   your eye.  You lose feeling on one side of your face.  You have trouble seeing.  You have ear pain, or you have ringing in your ear.  You have a loss of taste.  Your condition gets worse. Summary  Shingles gives you a painful skin rash and blisters that have fluid in them.  Shingles is an infection. It is caused by the same germ (virus) that causes chickenpox.  Keep your rash covered with a loose bandage (dressing). Wear loose clothing that does not rub on your rash.  If you have blisters that did not change into scabs yet, try not to touch other people or be around people. This information is not intended to replace advice given to you by  your health care provider. Make sure you discuss any questions you have with your health care provider. Document Revised: 09/06/2018 Document Reviewed: 01/17/2017 Elsevier Patient Education  2020 [employer]vier Inc.  

## 2020-05-26 ENCOUNTER — Inpatient Hospital Stay: Payer: Medicare PPO | Admitting: Nurse Practitioner

## 2020-05-27 ENCOUNTER — Telehealth: Payer: Self-pay | Admitting: Internal Medicine

## 2020-05-27 NOTE — Telephone Encounter (Signed)
Scheduled per 12/23 los, called and spoke with patient's daughter regarding patient's upcoming appointments.

## 2020-05-31 ENCOUNTER — Inpatient Hospital Stay: Payer: Medicare PPO | Admitting: Nurse Practitioner

## 2020-05-31 ENCOUNTER — Inpatient Hospital Stay: Payer: Medicare PPO

## 2020-05-31 ENCOUNTER — Telehealth: Payer: Self-pay

## 2020-05-31 ENCOUNTER — Inpatient Hospital Stay: Payer: Medicare PPO | Attending: Internal Medicine | Admitting: Internal Medicine

## 2020-05-31 ENCOUNTER — Other Ambulatory Visit: Payer: Self-pay

## 2020-05-31 VITALS — BP 137/76 | HR 94 | Temp 98.6°F | Resp 18 | Ht 66.0 in | Wt 147.6 lb

## 2020-05-31 DIAGNOSIS — C719 Malignant neoplasm of brain, unspecified: Secondary | ICD-10-CM

## 2020-05-31 DIAGNOSIS — R569 Unspecified convulsions: Secondary | ICD-10-CM | POA: Diagnosis not present

## 2020-05-31 DIAGNOSIS — B029 Zoster without complications: Secondary | ICD-10-CM | POA: Diagnosis not present

## 2020-05-31 DIAGNOSIS — C713 Malignant neoplasm of parietal lobe: Secondary | ICD-10-CM | POA: Diagnosis not present

## 2020-05-31 LAB — CMP (CANCER CENTER ONLY)
ALT: 39 U/L (ref 0–44)
AST: 14 U/L — ABNORMAL LOW (ref 15–41)
Albumin: 3 g/dL — ABNORMAL LOW (ref 3.5–5.0)
Alkaline Phosphatase: 82 U/L (ref 38–126)
Anion gap: 9 (ref 5–15)
BUN: 15 mg/dL (ref 8–23)
CO2: 28 mmol/L (ref 22–32)
Calcium: 9 mg/dL (ref 8.9–10.3)
Chloride: 105 mmol/L (ref 98–111)
Creatinine: 0.66 mg/dL (ref 0.44–1.00)
GFR, Estimated: >60 mL/min (ref >60)
Glucose, Bld: 137 mg/dL — ABNORMAL HIGH (ref 70–99)
Potassium: 3.4 mmol/L — ABNORMAL LOW (ref 3.5–5.1)
Sodium: 142 mmol/L (ref 135–145)
Total Bilirubin: 0.6 mg/dL (ref 0.3–1.2)
Total Protein: 6.3 g/dL — ABNORMAL LOW (ref 6.5–8.1)

## 2020-05-31 LAB — CBC WITH DIFFERENTIAL (CANCER CENTER ONLY)
Abs Immature Granulocytes: 0.64 10*3/uL — ABNORMAL HIGH (ref 0.00–0.07)
Basophils Absolute: 0 10*3/uL (ref 0.0–0.1)
Basophils Relative: 0 %
Eosinophils Absolute: 0 10*3/uL (ref 0.0–0.5)
Eosinophils Relative: 0 %
HCT: 37.4 % (ref 36.0–46.0)
Hemoglobin: 13.2 g/dL (ref 12.0–15.0)
Immature Granulocytes: 7 %
Lymphocytes Relative: 6 %
Lymphs Abs: 0.6 10*3/uL — ABNORMAL LOW (ref 0.7–4.0)
MCH: 30.5 pg (ref 26.0–34.0)
MCHC: 35.3 g/dL (ref 30.0–36.0)
MCV: 86.4 fL (ref 80.0–100.0)
Monocytes Absolute: 0.3 10*3/uL (ref 0.1–1.0)
Monocytes Relative: 4 %
Neutro Abs: 7.6 10*3/uL (ref 1.7–7.7)
Neutrophils Relative %: 83 %
Platelet Count: 137 10*3/uL — ABNORMAL LOW (ref 150–400)
RBC: 4.33 MIL/uL (ref 3.87–5.11)
RDW: 14.9 % (ref 11.5–15.5)
WBC Count: 9.2 10*3/uL (ref 4.0–10.5)
nRBC: 1 % — ABNORMAL HIGH (ref 0.0–0.2)
nRBC: 1 /100 WBC — ABNORMAL HIGH

## 2020-05-31 MED ORDER — GABAPENTIN 300 MG PO CAPS: 300.0000 mg | ORAL_CAPSULE | Freq: Two times a day (BID) | ORAL | 3 refills | Status: DC

## 2020-05-31 MED ORDER — DEXAMETHASONE 1 MG PO TABS: 2.0000 mg | ORAL_TABLET | Freq: Every day | ORAL | 1 refills | Status: DC

## 2020-05-31 NOTE — Progress Notes (Signed)
[facility] [place], [place] [address] [phone]   Interval Evaluation  Date of Service: 05/31/20 Patient Name: Wanda Ross Patient MRN: [id number] Patient DOB: 01-28-1951 Provider: Ventura Sellers, MD  Identifying Statement:  Wanda Ross is a 70 y.o. female with left frontal glioblastoma   Oncologic History: 11/26/19: Craniotomy, resection of L parietal mass with Dr. Marcello Moores; path is GBM 02/09/20: Completes IMRT and concurrent Temozolomide 60m/m2  Biomarkers:  MGMT Unknown.  IDH 1/2 Wild type.  EGFR Unknown  TERT Unknown   Interval History:  Wanda STOLTZpresents today for follow up after recent hospitalization for herpes zoster.  She continues to recover with regards to weeping and crusting of lesions, which are confined to lower aspect of face on the left side.  She does complain of some burning pain in the face and also the inner ear.  Completed valacyclovir treatment over the weekend, no compliaction.  Fortunately continues to be seizure free since 12/17 event.  No new or progressive deficits, denies headaches.  Fatigue is stable from prior.    H+P (12/15/19) Patient presented to medical attention in late June with several weeks of progressive symptoms, mainly characterized by gait impairment, confusion and disorientation.  CNS imaging was obtained which demonstrated 4-5cm mass in the left parietal region, consistent with likely brain brain tumor.  She underwent craniotomy and resection with Dr. TMarcello Mooreson 11/26/19.  Following surgery she developed some right sided weakness, but this recovered to essentially full strength following nearly 2 weeks course of inpatient rehab.  Currently, she is living at home with her sister, although prior to this she was living alone.  She maintains full functional status, no limitations at present time.  Retired middle school principal.  Medications: Current Outpatient  Medications on File Prior to Visit  Medication Sig Dispense Refill  . acetaminophen (TYLENOL) 325 MG tablet Take 2 tablets (650 mg total) by mouth every 4 (four) hours as needed for mild pain (temp > 100.5).    .Marland Kitchenalendronate (FOSAMAX) 70 MG tablet Take 1 tablet by mouth every Sunday.    .Marland KitchenamLODipine (NORVASC) 5 MG tablet Take 1 tablet (5 mg total) by mouth daily. 90 tablet 1  . Calcium Carbonate-Vit D-Min (CALCIUM 1200) 1200-1000 MG-UNIT CHEW Chew 1,200 mg by mouth daily. 30 tablet 1  . cloNIDine (CATAPRES) 0.3 MG tablet TAKE 1 TABLET BY MOUTH EVERYDAY AT BEDTIME (Patient taking differently: Take 0.3 mg by mouth at bedtime.) 90 tablet 0  . Cyanocobalamin 2500 MCG TABS Take 2,500 mcg by mouth daily. (Patient taking differently: Take 2,500 mcg by mouth at bedtime.) 30 tablet 0  . dexamethasone (DECADRON) 4 MG tablet Take 4 mg by mouth daily.    . dorzolamide-timolol (COSOPT) 22.3-6.8 MG/ML ophthalmic solution Place 1 drop into the right eye 2 (two) times daily.     .Marland KitchenlevETIRAcetam (KEPPRA) 1000 MG tablet Take 1 tablet (1,000 mg total) by mouth 2 (two) times daily. 60 tablet 0  . Multiple Vitamins-Minerals (CENTRUM SILVER ULTRA WOMENS) TABS Take 1 tablet by mouth daily.    . ondansetron (ZOFRAN) 8 MG tablet Take 1 tablet (8 mg total) by mouth 2 (two) times daily as needed (nausea and vomiting). May take 30-60 minutes prior to Temodar administration if nausea/vomiting occurs. 30 tablet 1  . pravastatin (PRAVACHOL) 10 MG tablet Take 1 tablet (10 mg total) by mouth at bedtime.     No current facility-administered medications on  file prior to visit.    Allergies: No Known Allergies Past Medical History:  Past Medical History:  Diagnosis Date  . Glaucoma   . Hyperlipidemia 2010  . Hypertension 1990  . IGT (impaired glucose tolerance) 05/11/2010  . Multinodular goiter 2010   normal function   Past Surgical History:  Past Surgical History:  Procedure Laterality Date  . APPLICATION OF CRANIAL  NAVIGATION Left 11/26/2019   Procedure: APPLICATION OF CRANIAL NAVIGATION;  Surgeon: [name], MD;  Location: [facility];  Service: Neurosurgery;  Laterality: Left;  APPLICATION OF CRANIAL NAVIGATION  . COLONOSCOPY N/A 06/16/2016   Procedure: COLONOSCOPY;  Surgeon: Danie Binder, MD;  [facility];  Service: Endoscopy;  Laterality: N/A;  9:30 AM  . COLONOSCOPY N/A 10/22/2019   Procedure: COLONOSCOPY;  Surgeon: Daneil Dolin, MD;  [facility];  Service: Endoscopy;  Laterality: N/A;  9:30  . CRANIOTOMY Left 11/26/2019   Procedure: LEFT PARIETAL CRANIOTOMY FOR RESECTION OF TUMOR;  Surgeon: [name], MD;  Location: [facility];  Service: Neurosurgery;  Laterality: Left;  LEFT PARIETAL CRANIOTOMY FOR RESECTION OF TUMOR  . EYE SURGERY  2017   3 on left eye 1 on right eye for glaucoma by Dr. Venetia Maxon   . EYE SURGERY  2018   Cataract Surgery by Dr. Arlina Robes   . OPERATIVE ULTRASOUND N/A 11/26/2019   Procedure: OPERATIVE ULTRASOUND;  Surgeon: [name], MD;  Location: [facility];  Service: Neurosurgery;  Laterality: N/A;  OPERATIVE ULTRASOUND   Social History:  Social History   Socioeconomic History  . Marital status: Widowed    Spouse name: Not on file  . Number of children: 3  . Years of education: Not on file  . Highest education level: Not on file  Occupational History  . Occupation: employed   Tobacco Use  . Smoking status: Never Smoker  . Smokeless tobacco: Never Used  Vaping Use  . Vaping Use: Never used  Substance and Sexual Activity  . Alcohol use: No  . Drug use: No  . Sexual activity: Never    Birth control/protection: Post-menopausal  Other Topics Concern  . Not on file  Social History Narrative  . Not on file   Social Determinants of Health   Financial Resource Strain: Not on file  Food Insecurity: Not on file  Transportation Needs: Not on file  Physical Activity: Not on file  Stress: Not on file  Social Connections: Not on file   Intimate Partner Violence: Not on file   Family History:  Family History  Problem Relation Age of Onset  . Dementia Father   . Dementia Mother   . Diabetes Mother   . Hypertension Mother   . Breast cancer Sister   . Hypertension Sister   . Diabetes Brother     Review of Systems: Constitutional: Doesn't report fevers, chills or abnormal weight loss Eyes: Doesn't report blurriness of vision Ears, nose, mouth, throat, and face: Doesn't report sore throat Respiratory: Doesn't report cough, dyspnea or wheezes Cardiovascular: Doesn't report palpitation, chest discomfort  Gastrointestinal:  Doesn't report nausea, constipation, diarrhea GU: Doesn't report incontinence Skin: Doesn't report skin rashes Neurological: Per HPI Musculoskeletal: Doesn't report joint pain Behavioral/Psych: Doesn't report anxiety  Physical Exam: Vitals:   05/31/20 1013  BP: 137/76  Pulse: 94  Resp: 18  Temp: 98.6 F (37 C)  SpO2: 100%   KPS: 80. General: Alert, cooperative, pleasant, in no acute distress Head: Normal EENT: No conjunctival injection  or scleral icterus.  Lungs: Resp effort normal Cardiac: Regular rate Abdomen: Non-distended abdomen Skin: Crusting, healing zoster rash right V3 distribution Extremities: No clubbing or edema  Neurologic Exam: Mental Status: Awake, alert, attentive to examiner. Oriented to self and environment. Language is fluent with intact comprehension.  Cranial Nerves: Visual acuity is grossly normal. Visual fields are full. Extra-ocular movements intact. No ptosis. Face is symmetric Motor: Tone and bulk are normal. Power is full in both arms and legs. Reflexes are symmetric, no pathologic reflexes present.  Sensory: Intact to light touch Gait: Modestly hemiparetic.   Labs: I have reviewed the data as listed    Component Value Date/Time   NA 138 05/24/2020 0722   K 3.8 05/24/2020 0722   CL 102 05/24/2020 0722   CO2 28 05/24/2020 0722   GLUCOSE 86  05/24/2020 0722   BUN 21 05/24/2020 0722   CREATININE 0.66 05/24/2020 0722   CREATININE 0.77 05/20/2020 0957   CREATININE 0.86 05/21/2019 0754   CALCIUM 8.4 (L) 05/24/2020 0722   PROT 5.1 (L) 05/23/2020 1004   ALBUMIN 2.7 (L) 05/23/2020 1004   AST 27 05/23/2020 1004   AST 17 05/20/2020 0957   ALT 48 (H) 05/23/2020 1004   ALT 46 (H) 05/20/2020 0957   ALKPHOS 47 05/23/2020 1004   BILITOT 0.6 05/23/2020 1004   BILITOT 0.6 05/20/2020 0957   GFRNONAA >60 05/24/2020 0722   GFRNONAA >60 05/20/2020 0957   GFRNONAA 68 05/21/2019 0754   GFRAA >60 02/05/2020 1056   GFRAA 79 05/21/2019 0754   Lab Results  Component Value Date   WBC 7.3 05/22/2020   NEUTROABS 6.1 05/22/2020   HGB 14.6 05/22/2020   HCT 41.7 05/22/2020   MCV 88.3 05/22/2020   PLT 94 (L) 05/22/2020     Assessment/Plan Glioblastoma ([place]) [C71.9]  Conley Rolls Julin is clinically improving today, still recovering from zoster reactivation affecting R V3 distribution.  She has completed anti-viral therapy.  Once fully recovered from her infection, we will recommend continuing treatment with cycle #3 Temozolomide dose reduced to 150 mg/m2, on for five days and off for twenty three days in twenty eight day cycles. The patient will have a complete blood count performed on days 21 and 28 of each cycle, and a comprehensive metabolic panel performed on day 28 of each cycle. Labs may need to be performed more often. Zofran will prescribed for home use for nausea/vomiting.  Dose reduction is recommended due to thrombocytopenia, now improved.    Cycle #3 should not begin until further improvement is made with facial rash, and steroids are more fully weaned, given potential culpability in zoster reactivation.  Chemotherapy should be held for the following:  ANC less than 1,000  Platelets less than 100,000  LFT or creatinine greater than 2x ULN  If clinical concerns/contraindications develop  Should continue Keppra 1074m BID,  decadron can be decreased to 247mdaily x1 week, then 81m56maily x1 week, then stopped if tolerated.  We will touch base with FloConan Bowens 2 weeks via phone before giving permission to resume chemotherapy.  All questions were answered. The patient knows to call the clinic with any problems, questions or concerns. No barriers to learning were detected.  I have spent a total of 30 minutes of face-to-face and non-face-to-face time, excluding clinical staff time, preparing to see patient, ordering tests and/or medications, counseling the patient, and independently interpreting results and communicating results to the patient/family/caregiver    ZacVentura SellersD Medical  Director of Neuro-Oncology [facility] [place] 05/31/20 9:46 AM

## 2020-05-31 NOTE — Telephone Encounter (Signed)
Spoke with patient's daughter [name] and sister and scheduled an in-person Palliative Consult for 06/17/20 @ 3 PM.   COVID screening was negative. No pets in home.   Consent obtained; updated Outlook/Netsmart/Team List and Epic

## 2020-05-31 NOTE — Telephone Encounter (Signed)
Attempted to contact patient's daughter [name] to schedule a Palliative Care consult appointment. No answer left a message to return call. 

## 2020-06-01 ENCOUNTER — Ambulatory Visit: Payer: Medicare PPO | Admitting: Nurse Practitioner

## 2020-06-01 ENCOUNTER — Encounter: Payer: Self-pay | Admitting: Nurse Practitioner

## 2020-06-01 ENCOUNTER — Other Ambulatory Visit: Payer: Self-pay

## 2020-06-01 DIAGNOSIS — B028 Zoster with other complications: Secondary | ICD-10-CM | POA: Insufficient documentation

## 2020-06-01 DIAGNOSIS — E876 Hypokalemia: Secondary | ICD-10-CM

## 2020-06-01 MED ORDER — UNABLE TO FIND: 0 refills | Status: DC

## 2020-06-01 MED ORDER — POTASSIUM CHLORIDE ER 10 MEQ PO TBCR: 10.0000 meq | EXTENDED_RELEASE_TABLET | Freq: Every day | ORAL | 0 refills | Status: DC

## 2020-06-01 NOTE — Progress Notes (Signed)
Established Patient Office Visit  Subjective:  Patient ID: Wanda Ross, female    DOB: [birth year]  Age: 70 y.o. MRN: [id number]  CC:  Chief Complaint  Patient presents with  . Herpes Zoster    Recently discharged from [facility] on 05/25/20, shingles complications.     HPI Wanda Ross presents for hospital follow-up. Wanda H Simpsonis a 70 y.o.femaleretired school principalwith medical history significantfor glioblastoma. She presented initially in late June 2021 with progressive symptoms described as gait impairment confusion and disorientation. Her CNS imaging demonstrated a 4 to 5 cm mass in the left parietal region and she underwent craniotomy and resection by Dr. Maisie Fus on 11/26/2019. She has since had residual right-sided weakness. She lives with her sister. She was recently hospitalized at [facility] for seizures. She has been on Keppra 1000 mg twice daily and has been seizure-free since discharge.  After her recent discharge she began to experience swelling on the right side of the face that has significantly gotten worse. She has developed blisters on the right side of the face that have been very painful. No loss of vision. No difficulty eating or drinking. She also reports that she has had lesions inside of the mouth. She has had no loss of hearing and no pain in the ear. She has had no fever chills or altered mentation.  Patient was diagnosed with herpes zoster in the emergency department and infectious disease was consulted and they were able to view years of her lesions and recommended 24 to 48 hours of IV acyclovir treatment given immunocompromise status as patient is on daily dexamethasone and currently undergoing chemotherapy for glioblastoma.  This patient was admitted with a severe case of facial herpes zoster involving the right eye which was nearly completely closed due to severe edema involving the right side of the face.  She also had severe  herpetic stomatitis in the mouth that affected her eating and drinking to the point where she was not able to eat or drink enough to sustain her needs.  She was placed on IV acyclovir and was monitored in the hospital given her recent hospitalization for seizure activities.  We wanted to be sure that because acyclovir could potentially lower the seizure threshold and she would not have further seizures.  She continued to have difficulty with mouth pain eating and drinking and we continued IV acyclovir until 05/25/2020 when she demonstrated that she was able to eat and drink enough to maintain her basic hydration and nutrition needs and avoid rehospitalization.    She was discharged home on oral Valtrex 1000 mg 3 times daily for additional 5 days to complete course of therapy.  At time of discharge she had no difficulty with vision and she was able to eat and drink.  Close outpatient follow-up with her oncologist care provider recommended.  She was set up with [facility] for PT as well as Charity fundraiser and aid.  She was seen by oncology yesterday.  Oncology noted "She continues to recover with regards to weeping and crusting of lesions, which are confined to lower aspect of face on the left side.  She does complain of some burning pain in the face and also the inner ear.  Completed valacyclovir treatment over the weekend, no compliaction.  Fortunately continues to be seizure free since 12/17 event.  No new or progressive deficits, denies headaches.  Fatigue is stable from prior." Oncology drew labs yesterday.  Her potassium is slightly low, but she will have  labs drawn again in 2 weeks with oncology.   Past Medical History:  Diagnosis Date  . Glaucoma   . Hyperlipidemia 2010  . Hypertension 1990  . IGT (impaired glucose tolerance) 05/11/2010  . Multinodular goiter 2010   normal function    Past Surgical History:  Procedure Laterality Date  . APPLICATION OF CRANIAL NAVIGATION Left 11/26/2019   Procedure:  APPLICATION OF CRANIAL NAVIGATION;  Surgeon: [name], MD;  Location: [place];  Service: Neurosurgery;  Laterality: Left;  APPLICATION OF CRANIAL NAVIGATION  . COLONOSCOPY N/A 06/16/2016   Procedure: COLONOSCOPY;  Surgeon: Danie Binder, MD;  [facility];  Service: Endoscopy;  Laterality: N/A;  9:30 AM  . COLONOSCOPY N/A 10/22/2019   Procedure: COLONOSCOPY;  Surgeon: Daneil Dolin, MD;  [facility];  Service: Endoscopy;  Laterality: N/A;  9:30  . CRANIOTOMY Left 11/26/2019   Procedure: LEFT PARIETAL CRANIOTOMY FOR RESECTION OF TUMOR;  Surgeon: [name], MD;  Location: [place];  Service: Neurosurgery;  Laterality: Left;  LEFT PARIETAL CRANIOTOMY FOR RESECTION OF TUMOR  . EYE SURGERY  2017   3 on left eye 1 on right eye for glaucoma by Dr. Venetia Maxon   . EYE SURGERY  2018   Cataract Surgery by Dr. Arlina Robes   . OPERATIVE ULTRASOUND N/A 11/26/2019   Procedure: OPERATIVE ULTRASOUND;  Surgeon: [name], MD;  Location: [place];  Service: Neurosurgery;  Laterality: N/A;  OPERATIVE ULTRASOUND    Family History  Problem Relation Age of Onset  . Dementia Father   . Dementia Mother   . Diabetes Mother   . Hypertension Mother   . Breast cancer Sister   . Hypertension Sister   . Diabetes Brother     Social History   Socioeconomic History  . Marital status: Widowed    Spouse name: Not on file  . Number of children: 3  . Years of education: Not on file  . Highest education level: Not on file  Occupational History  . Occupation: employed   Tobacco Use  . Smoking status: Never Smoker  . Smokeless tobacco: Never Used  Vaping Use  . Vaping Use: Never used  Substance and Sexual Activity  . Alcohol use: No  . Drug use: No  . Sexual activity: Never    Birth control/protection: Post-menopausal  Other Topics Concern  . Not on file  Social History Narrative  . Not on file   Social Determinants of Health   Financial Resource Strain: Not on file   Food Insecurity: Not on file  Transportation Needs: Not on file  Physical Activity: Not on file  Stress: Not on file  Social Connections: Not on file  Intimate Partner Violence: Not on file    Outpatient Medications Prior to Visit  Medication Sig Dispense Refill  . acetaminophen (TYLENOL) 325 MG tablet Take 2 tablets (650 mg total) by mouth every 4 (four) hours as needed for mild pain (temp > 100.5).    Marland Kitchen alendronate (FOSAMAX) 70 MG tablet Take 1 tablet by mouth every Sunday.    Marland Kitchen amLODipine (NORVASC) 5 MG tablet Take 1 tablet (5 mg total) by mouth daily. 90 tablet 1  . Calcium Carbonate-Vit D-Min (CALCIUM 1200) 1200-1000 MG-UNIT CHEW Chew 1,200 mg by mouth daily. 30 tablet 1  . cloNIDine (CATAPRES) 0.3 MG tablet TAKE 1 TABLET BY MOUTH EVERYDAY AT BEDTIME (Patient taking differently: Take 0.3 mg by mouth at bedtime.) 90 tablet 0  . Cyanocobalamin 2500 MCG  TABS Take 2,500 mcg by mouth daily. (Patient taking differently: Take 2,500 mcg by mouth at bedtime.) 30 tablet 0  . dexamethasone (DECADRON) 1 MG tablet Take 2 tablets (2 mg total) by mouth daily. 30 tablet 1  . dorzolamide-timolol (COSOPT) 22.3-6.8 MG/ML ophthalmic solution Place 1 drop into the right eye 2 (two) times daily.     Marland Kitchen gabapentin (NEURONTIN) 300 MG capsule Take 1 capsule (300 mg total) by mouth 2 (two) times daily. 60 capsule 3  . levETIRAcetam (KEPPRA) 1000 MG tablet Take 1 tablet (1,000 mg total) by mouth 2 (two) times daily. 60 tablet 0  . Multiple Vitamins-Minerals (CENTRUM SILVER ULTRA WOMENS) TABS Take 1 tablet by mouth daily.    . ondansetron (ZOFRAN) 8 MG tablet Take 1 tablet (8 mg total) by mouth 2 (two) times daily as needed (nausea and vomiting). May take 30-60 minutes prior to Temodar administration if nausea/vomiting occurs. 30 tablet 1  . pravastatin (PRAVACHOL) 10 MG tablet Take 1 tablet (10 mg total) by mouth at bedtime.     No facility-administered medications prior to visit.    No Known  Allergies  ROS Review of Systems  Constitutional: Negative.   HENT: Positive for ear discharge and ear pain.   Eyes:       Vision and swelling have improved  Respiratory: Negative.   Cardiovascular: Negative.       Objective:    Physical Exam Constitutional:      Appearance: She is ill-appearing.  HENT:     Left Ear: Tympanic membrane, ear canal and external ear normal.     Ears:     Comments: Outside of her right ear has healing herpetic lesions that extend into her auditory canal, scant white discharge visible Eyes:     Extraocular Movements: Extraocular movements intact.     Conjunctiva/sclera: Conjunctivae normal.     Pupils: Pupils are equal, round, and reactive to light.  Cardiovascular:     Rate and Rhythm: Normal rate and regular rhythm.  Pulmonary:     Effort: Pulmonary effort is normal.     Breath sounds: Normal breath sounds.  Neurological:     Mental Status: She is alert.     BP 139/81   Pulse (!) 108   Temp 98.2 F (36.8 C)   Resp 20   Ht 5\' 8"  (1.727 m)   Wt 148 lb (67.1 kg)   SpO2 95%   BMI 22.50 kg/m  Wt Readings from Last 3 Encounters:  06/01/20 148 lb (67.1 kg)  05/31/20 147 lb 9.6 oz (67 kg)  05/22/20 149 lb 7.6 oz (67.8 kg)     Health Maintenance Due  Topic Date Due  . COVID-19 Vaccine (1) Never done  . INFLUENZA VACCINE  12/28/2019    There are no preventive care reminders to display for this patient.  Lab Results  Component Value Date   TSH 0.412 11/23/2019   Lab Results  Component Value Date   WBC 9.2 05/31/2020   HGB 13.2 05/31/2020   HCT 37.4 05/31/2020   MCV 86.4 05/31/2020   PLT 137 (L) 05/31/2020   Lab Results  Component Value Date   NA 142 05/31/2020   K 3.4 (L) 05/31/2020   CO2 28 05/31/2020   GLUCOSE 137 (H) 05/31/2020   BUN 15 05/31/2020   CREATININE 0.66 05/31/2020   BILITOT 0.6 05/31/2020   ALKPHOS 82 05/31/2020   AST 14 (L) 05/31/2020   ALT 39 05/31/2020   PROT 6.3 (L) 05/31/2020  ALBUMIN 3.0 (L)  05/31/2020   CALCIUM 9.0 05/31/2020   ANIONGAP 9 05/31/2020   Lab Results  Component Value Date   CHOL 145 05/21/2019   Lab Results  Component Value Date   HDL 48 (L) 05/21/2019   Lab Results  Component Value Date   LDLCALC 80 05/21/2019   Lab Results  Component Value Date   TRIG 97 05/17/2020   Lab Results  Component Value Date   CHOLHDL 3.0 05/21/2019   Lab Results  Component Value Date   HGBA1C 6.5 (H) 05/14/2020      Assessment & Plan:   Problem List Items Addressed This Visit      Other   Hypokalemia    -K 3.4 from labs drawn yesterday -Rx. K; labs will be checked in 2 weeks by oncology      Zoster with other complications    -has vesicles in her right auditory canal -guidelines suggest antivirals and steroids, and she received the antivirals in the hospital and is improving, but she still has pain to her ear -Rx. HB otic drops from [place] ordered this encounter  Medications  . UNABLE TO FIND    Sig: Med Name: HB Otic Drops (hydrocortisone 1%,benzocaine 2%) Sig: Fill ear canal with 3-4 drops up to 3 to 4 times daily #15 mL    Dispense:  15 mL    Refill:  0  . potassium chloride (KLOR-CON) 10 MEQ tablet    Sig: Take 1 tablet (10 mEq total) by mouth daily.    Dispense:  7 tablet    Refill:  0    Follow-up: Return if symptoms worsen or fail to improve.    Noreene Larsson, NP

## 2020-06-01 NOTE — Assessment & Plan Note (Signed)
-  has vesicles in her right auditory canal -guidelines suggest antivirals and steroids, and she received the antivirals in the hospital and is improving, but she still has pain to her ear -Rx. HB otic drops from [place]

## 2020-06-01 NOTE — Patient Instructions (Signed)
For ear pain, I sent in special drops that will be compounded by [facility].  They have a steroid and pain reliever to decrease pain in your ear.  For low potassium, I called in potassium tablets. Please take them daily for 1 week.

## 2020-06-01 NOTE — Assessment & Plan Note (Addendum)
-  K 3.4 from labs drawn yesterday -Rx. K; labs will be checked in 2 weeks by oncology

## 2020-06-02 ENCOUNTER — Other Ambulatory Visit: Payer: Self-pay

## 2020-06-02 DIAGNOSIS — B028 Zoster with other complications: Secondary | ICD-10-CM

## 2020-06-02 MED ORDER — UNABLE TO FIND: 0 refills | Status: DC

## 2020-06-08 ENCOUNTER — Telehealth: Payer: Self-pay | Admitting: Internal Medicine

## 2020-06-08 DIAGNOSIS — B028 Zoster with other complications: Secondary | ICD-10-CM | POA: Diagnosis not present

## 2020-06-08 DIAGNOSIS — G8191 Hemiplegia, unspecified affecting right dominant side: Secondary | ICD-10-CM | POA: Diagnosis not present

## 2020-06-08 DIAGNOSIS — D696 Thrombocytopenia, unspecified: Secondary | ICD-10-CM | POA: Diagnosis not present

## 2020-06-08 DIAGNOSIS — C713 Malignant neoplasm of parietal lobe: Secondary | ICD-10-CM | POA: Diagnosis not present

## 2020-06-08 DIAGNOSIS — I10 Essential (primary) hypertension: Secondary | ICD-10-CM | POA: Diagnosis not present

## 2020-06-08 DIAGNOSIS — R739 Hyperglycemia, unspecified: Secondary | ICD-10-CM | POA: Diagnosis not present

## 2020-06-08 DIAGNOSIS — T380X5D Adverse effect of glucocorticoids and synthetic analogues, subsequent encounter: Secondary | ICD-10-CM | POA: Diagnosis not present

## 2020-06-08 DIAGNOSIS — G40909 Epilepsy, unspecified, not intractable, without status epilepticus: Secondary | ICD-10-CM | POA: Diagnosis not present

## 2020-06-08 DIAGNOSIS — B0230 Zoster ocular disease, unspecified: Secondary | ICD-10-CM | POA: Diagnosis not present

## 2020-06-08 NOTE — Telephone Encounter (Signed)
Scheduled per 01/03 los, patient has been called and voicemail was left.

## 2020-06-10 ENCOUNTER — Other Ambulatory Visit: Payer: Self-pay | Admitting: Internal Medicine

## 2020-06-10 ENCOUNTER — Telehealth: Payer: Self-pay | Admitting: *Deleted

## 2020-06-10 MED ORDER — GABAPENTIN 300 MG PO CAPS: 600.0000 mg | ORAL_CAPSULE | Freq: Three times a day (TID) | ORAL | 2 refills | Status: AC

## 2020-06-10 NOTE — Telephone Encounter (Signed)
Received call from pt's daughter, [name]. She states that her mother is still having sever pain from her Herpes zoster (shingles) infection on her face. She had been treated with IV Acyclovir in the hospital. She is able to swallow, drink water but c/o of severe pain on her right side of her face.  She is currently on gabapentin 300 mg BID and ear drops for the vesicles in her right ear. Advised that I would talk to Dr. Mickeal Skinner and then call her back. She voiced understanding.

## 2020-06-10 NOTE — Telephone Encounter (Signed)
Spoke with Dr. Mickeal Skinner regarding pt's pain. He is recommending increasing the gabapentin to 600 mg TID and sent the prescription in to [facility]. TCT pt's daughter and advised her of the change in medication.  [name] voiced understanding and will start the 600mg  tonight.  She is aware that she will need to pick up additional medication from [facility] tomorrow.

## 2020-06-11 ENCOUNTER — Telehealth: Payer: Self-pay

## 2020-06-11 ENCOUNTER — Other Ambulatory Visit: Payer: Self-pay | Admitting: Internal Medicine

## 2020-06-11 DIAGNOSIS — B0230 Zoster ocular disease, unspecified: Secondary | ICD-10-CM | POA: Diagnosis not present

## 2020-06-11 DIAGNOSIS — G40909 Epilepsy, unspecified, not intractable, without status epilepticus: Secondary | ICD-10-CM | POA: Diagnosis not present

## 2020-06-11 DIAGNOSIS — G8191 Hemiplegia, unspecified affecting right dominant side: Secondary | ICD-10-CM | POA: Diagnosis not present

## 2020-06-11 DIAGNOSIS — C713 Malignant neoplasm of parietal lobe: Secondary | ICD-10-CM | POA: Diagnosis not present

## 2020-06-11 DIAGNOSIS — R739 Hyperglycemia, unspecified: Secondary | ICD-10-CM | POA: Diagnosis not present

## 2020-06-11 DIAGNOSIS — D696 Thrombocytopenia, unspecified: Secondary | ICD-10-CM | POA: Diagnosis not present

## 2020-06-11 DIAGNOSIS — B028 Zoster with other complications: Secondary | ICD-10-CM | POA: Diagnosis not present

## 2020-06-11 DIAGNOSIS — T380X5D Adverse effect of glucocorticoids and synthetic analogues, subsequent encounter: Secondary | ICD-10-CM | POA: Diagnosis not present

## 2020-06-11 DIAGNOSIS — I10 Essential (primary) hypertension: Secondary | ICD-10-CM | POA: Diagnosis not present

## 2020-06-11 MED ORDER — LIDOCAINE 5 % EX OINT: 1.0000 "application " | TOPICAL_OINTMENT | CUTANEOUS | 0 refills | Status: DC | PRN

## 2020-06-11 NOTE — Telephone Encounter (Signed)
The patient's daughter called [place] today to inform us that the patient is still reporting a lot of pain due to shingles even after Dr. Mickeal Skinner increased her Gabapentin. [name] also wanted Dr. Mickeal Skinner to be aware that the patient is now complaining of chest pain and right shoulder pain. The patient has a nurse coming out today to evaluate her at 2 pm and was also seen by PT at 11 am today. After speaking with Dr. Mickeal Skinner I advised [name] to give the increased dosage of Gabapentin a little more time to work since the patient has only been taking it for less than 24 hours. [name] was advised to take the patient to the ER if she was reporting continued chest pain per Dr. Mickeal Skinner. The patient's daughter verbalized understanding. Dr. Mickeal Skinner requested that the patient has a phone visit with him on 1/20. A message has been sent to scheduling to arrange the phone visit.

## 2020-06-12 ENCOUNTER — Encounter (HOSPITAL_COMMUNITY): Payer: Self-pay

## 2020-06-12 ENCOUNTER — Emergency Department (HOSPITAL_COMMUNITY)
Admission: EM | Admit: 2020-06-12 | Discharge: 2020-06-12 | Disposition: A | Discharge status: Home / Self Care | Payer: Medicare PPO | Attending: Emergency Medicine | Admitting: Emergency Medicine

## 2020-06-12 ENCOUNTER — Emergency Department (HOSPITAL_COMMUNITY): Payer: Medicare PPO

## 2020-06-12 ENCOUNTER — Other Ambulatory Visit: Payer: Self-pay

## 2020-06-12 DIAGNOSIS — I10 Essential (primary) hypertension: Secondary | ICD-10-CM | POA: Diagnosis not present

## 2020-06-12 DIAGNOSIS — Z79899 Other long term (current) drug therapy: Secondary | ICD-10-CM | POA: Diagnosis not present

## 2020-06-12 DIAGNOSIS — N390 Urinary tract infection, site not specified: Secondary | ICD-10-CM | POA: Insufficient documentation

## 2020-06-12 DIAGNOSIS — Z20822 Contact with and (suspected) exposure to covid-19: Secondary | ICD-10-CM | POA: Diagnosis not present

## 2020-06-12 DIAGNOSIS — B029 Zoster without complications: Secondary | ICD-10-CM | POA: Diagnosis not present

## 2020-06-12 DIAGNOSIS — E785 Hyperlipidemia, unspecified: Secondary | ICD-10-CM | POA: Insufficient documentation

## 2020-06-12 DIAGNOSIS — R0789 Other chest pain: Secondary | ICD-10-CM | POA: Diagnosis not present

## 2020-06-12 DIAGNOSIS — R000 Tachycardia, unspecified: Secondary | ICD-10-CM | POA: Diagnosis not present

## 2020-06-12 DIAGNOSIS — R21 Rash and other nonspecific skin eruption: Secondary | ICD-10-CM | POA: Diagnosis present

## 2020-06-12 DIAGNOSIS — R4182 Altered mental status, unspecified: Secondary | ICD-10-CM | POA: Diagnosis not present

## 2020-06-12 LAB — URINALYSIS, ROUTINE W REFLEX MICROSCOPIC
Bilirubin Urine: NEGATIVE
Glucose, UA: NEGATIVE mg/dL
Ketones, ur: NEGATIVE mg/dL
Nitrite: NEGATIVE
Protein, ur: 30 mg/dL — AB
Specific Gravity, Urine: 1.015 (ref 1.005–1.030)
pH: 5 (ref 5.0–8.0)

## 2020-06-12 LAB — CBC WITH DIFFERENTIAL/PLATELET
Abs Immature Granulocytes: 0.25 10*3/uL — ABNORMAL HIGH (ref 0.00–0.07)
Basophils Absolute: 0 10*3/uL (ref 0.0–0.1)
Basophils Relative: 0 %
Eosinophils Absolute: 0 10*3/uL (ref 0.0–0.5)
Eosinophils Relative: 0 %
HCT: 30.9 % — ABNORMAL LOW (ref 36.0–46.0)
Hemoglobin: 10.6 g/dL — ABNORMAL LOW (ref 12.0–15.0)
Immature Granulocytes: 2 %
Lymphocytes Relative: 5 %
Lymphs Abs: 0.6 10*3/uL — ABNORMAL LOW (ref 0.7–4.0)
MCH: 32.1 pg (ref 26.0–34.0)
MCHC: 34.3 g/dL (ref 30.0–36.0)
MCV: 93.6 fL (ref 80.0–100.0)
Monocytes Absolute: 0.4 10*3/uL (ref 0.1–1.0)
Monocytes Relative: 3 %
Neutro Abs: 11.4 10*3/uL — ABNORMAL HIGH (ref 1.7–7.7)
Neutrophils Relative %: 90 %
Platelets: 123 10*3/uL — ABNORMAL LOW (ref 150–400)
RBC: 3.3 MIL/uL — ABNORMAL LOW (ref 3.87–5.11)
RDW: 17.8 % — ABNORMAL HIGH (ref 11.5–15.5)
WBC: 12.7 10*3/uL — ABNORMAL HIGH (ref 4.0–10.5)
nRBC: 0.2 % (ref 0.0–0.2)

## 2020-06-12 LAB — COMPREHENSIVE METABOLIC PANEL
ALT: 33 U/L (ref 0–44)
AST: 20 U/L (ref 15–41)
Albumin: 2.5 g/dL — ABNORMAL LOW (ref 3.5–5.0)
Alkaline Phosphatase: 63 U/L (ref 38–126)
Anion gap: 11 (ref 5–15)
BUN: 22 mg/dL (ref 8–23)
CO2: 25 mmol/L (ref 22–32)
Calcium: 8.2 mg/dL — ABNORMAL LOW (ref 8.9–10.3)
Chloride: 100 mmol/L (ref 98–111)
Creatinine, Ser: 0.76 mg/dL (ref 0.44–1.00)
GFR, Estimated: >60 mL/min (ref >60)
Glucose, Bld: 127 mg/dL — ABNORMAL HIGH (ref 70–99)
Potassium: 3.6 mmol/L (ref 3.5–5.1)
Sodium: 136 mmol/L (ref 135–145)
Total Bilirubin: 1.1 mg/dL (ref 0.3–1.2)
Total Protein: 6.3 g/dL — ABNORMAL LOW (ref 6.5–8.1)

## 2020-06-12 LAB — RESP PANEL BY RT-PCR (FLU A&B, COVID) ARPGX2
Influenza A by PCR: NEGATIVE
Influenza B by PCR: NEGATIVE
SARS Coronavirus 2 by RT PCR: NEGATIVE

## 2020-06-12 MED ORDER — SODIUM CHLORIDE 0.9 % IV SOLN
1.0000 g | Freq: Once | INTRAVENOUS | Status: AC
  Administered 2020-06-12: 1 g via INTRAVENOUS
  Filled 2020-06-12: qty 10

## 2020-06-12 MED ORDER — SODIUM CHLORIDE 0.9 % IV BOLUS
500.0000 mL | Freq: Once | INTRAVENOUS | Status: AC
  Administered 2020-06-12: 500 mL via INTRAVENOUS

## 2020-06-12 MED ORDER — AMOXICILLIN-POT CLAVULANATE 875-125 MG PO TABS: 1.0000 | ORAL_TABLET | Freq: Two times a day (BID) | ORAL | 0 refills | Status: DC

## 2020-06-12 NOTE — ED Triage Notes (Incomplete)
Dx with shingles christmas day 2021. Scratching and rubbing right shoulder. Pt has severe cognitive impairment related to brain tumor removal June 2021.

## 2020-06-12 NOTE — Discharge Instructions (Signed)
Start the antibiotic prescription by tomorrow.  Follow-up with Dr. Mickeal Skinner for further care and treatment on Tuesday as planned

## 2020-06-12 NOTE — ED Provider Notes (Signed)
[facility] EMERGENCY DEPARTMENT Provider Note   CSN: [id number] Arrival date & time: 06/12/20  [id number]     History Chief Complaint  Patient presents with  . Shoulder Pain    Wanda Ross is a 70 y.o. female.  HPI Patient presents with a family member for concerns of various problems including recovering from a herpes zoster rash, redness of her chest, abdominal discomfort, trouble eating, and difficulty proceeding with usual treatments.  She was recently placed on gabapentin and the medication was increased, several days ago, as treatment for postherpetic neuralgia. A family member with her states that she is worried that the patient has something acutely wrong with her. She is concerned that possibly this is related to when she was intubated, following respiratory arrest while having seizures last month. The patient does not give history. She is able to communicate, but stated that she did not have anything bothering her at this time.  Level 5 caveat-altered mental status    Past Medical History:  Diagnosis Date  . Glaucoma   . Hyperlipidemia 2010  . Hypertension 1990  . IGT (impaired glucose tolerance) 05/11/2010  . Multinodular goiter 2010   normal function    Patient Active Problem List   Diagnosis Date Noted  . Zoster with other complications [id number]  . Herpes zoster 05/22/2020  . Acute respiratory failure with hypoxemia ([place]) 05/14/2020  . Seizures ([place]) 05/14/2020  . Thrombocytopenia ([place]) 05/14/2020  . Lactic acidosis 05/14/2020  . Hypokalemia 05/14/2020  . Hyperglycemia 05/14/2020  . Pressure injury of skin 05/14/2020  . Goals of care, counseling/discussion 12/15/2019  . Glioblastoma ([place])   . Hypoalbuminemia due to protein-calorie malnutrition ([place])   . Transaminitis   . Benign essential HTN   . Prediabetes   . Seizure prophylaxis   . Glioma ([place]) 12/02/2019  . Right sided weakness   . Dyslipidemia   . Essential hypertension   . Steroid-induced  hyperglycemia   . Leucocytosis   . Brain mass 11/22/2019  . 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 Abnormal electrocardiogram (ECG) (EKG) 12/31/2017  . White coat syndrome with diagnosis of hypertension 12/31/2017  . Glaucoma 12/26/2013  . Vitamin D deficiency 05/27/2009  . IMPAIRED GLUCOSE TOLERANCE 05/27/2009  . OSTEOPENIA 05/27/2009  . Multinodular goiter 2010  . Multiple thyroid nodules 11/06/2007  . Hyperlipidemia 11/06/2007  . 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 APPLICATION OF CRANIAL NAVIGATION Left 11/26/2019   Procedure: APPLICATION OF CRANIAL NAVIGATION;  Surgeon: [name], MD;  Location: [place];  Service: Neurosurgery;  Laterality: Left;  APPLICATION OF CRANIAL NAVIGATION  . COLONOSCOPY N/A 06/16/2016   Procedure: COLONOSCOPY;  Surgeon: Danie Binder, MD;  [facility];  Service: Endoscopy;  Laterality: N/A;  9:30 AM  . COLONOSCOPY N/A 10/22/2019   Procedure: COLONOSCOPY;  Surgeon: Daneil Dolin, MD;  [facility];  Service: Endoscopy;  Laterality: N/A;  9:30  . CRANIOTOMY Left 11/26/2019   Procedure: LEFT PARIETAL CRANIOTOMY FOR RESECTION OF TUMOR;  Surgeon: [name], MD;  Location: [place];  Service: Neurosurgery;  Laterality: Left;  LEFT PARIETAL CRANIOTOMY FOR RESECTION OF TUMOR  . EYE SURGERY  2017   3 on left eye 1 on right eye for glaucoma by Dr. Venetia Maxon   . EYE SURGERY  2018   Cataract Surgery by Dr. Arlina Robes   . OPERATIVE ULTRASOUND N/A 11/26/2019   Procedure: OPERATIVE ULTRASOUND;  Surgeon: [name], MD;  Location: [place] [facility];  Service: Neurosurgery;  Laterality: N/A;  OPERATIVE ULTRASOUND     OB History   No obstetric history on file.     Family History  Problem Relation Age of Onset  . Dementia Father   . Dementia Mother   . Diabetes Mother   . Hypertension Mother   . Breast cancer Sister   . Hypertension Sister   . Diabetes Brother     Social History   Tobacco  Use  . Smoking status: Never Smoker  . Smokeless tobacco: Never Used  Vaping Use  . Vaping Use: Never used  Substance Use Topics  . Alcohol use: No  . Drug use: No    Home Medications Prior to Admission medications   Medication Sig Start Date End Date Taking? Authorizing Provider  acetaminophen (TYLENOL) 325 MG tablet Take 2 tablets (650 mg total) by mouth every 4 (four) hours as needed for mild pain (temp > 100.5). 12/02/19  Yes [place] Shi, MD  alendronate (FOSAMAX) 70 MG tablet Take 70 mg by mouth every Sunday. 12/14/19  Yes [provider]  amLODipine (NORVASC) 5 MG tablet Take 1 tablet (5 mg total) by mouth daily. 12/09/19  Yes Angiulli, Lavon Paganini, PA-C  amoxicillin-clavulanate (AUGMENTIN) 875-125 MG tablet Take 1 tablet by mouth 2 (two) times daily. One po bid x 7 days 06/12/20  Yes Daleen Bo, MD  Calcium Carbonate-Vit D-Min (CALCIUM 1200) 1200-1000 MG-UNIT CHEW Chew 1,200 mg by mouth daily. 12/09/19  Yes Angiulli, Lavon Paganini, PA-C  cloNIDine (CATAPRES) 0.3 MG tablet TAKE 1 TABLET BY MOUTH EVERYDAY AT BEDTIME Patient taking differently: Take 0.3 mg by mouth at bedtime. 12/09/19  Yes Angiulli, Lavon Paganini, PA-C  Cyanocobalamin 2500 MCG TABS Take 2,500 mcg by mouth daily. Patient taking differently: Take 2,500 mcg by mouth at bedtime. 12/09/19  Yes Angiulli, Lavon Paganini, PA-C  DENTA 5000 PLUS 1.1 % CREA dental cream Take 1 application by mouth daily. 06/09/20  Yes [provider]  dexamethasone (DECADRON) 1 MG tablet Take 2 tablets (2 mg total) by mouth daily. Patient taking differently: Take 1 mg by mouth daily. 05/31/20  Yes Vaslow, Acey Lav, MD  gabapentin (NEURONTIN) 300 MG capsule Take 2 capsules (600 mg total) by mouth 3 (three) times daily. 06/10/20  Yes Vaslow, Acey Lav, MD  levETIRAcetam (KEPPRA) 1000 MG tablet Take 1 tablet (1,000 mg total) by mouth 2 (two) times daily. 05/19/20  Yes Aline August, MD  Multiple Vitamins-Minerals (CENTRUM SILVER ULTRA WOMENS) TABS  Take 1 tablet by mouth daily.   Yes [provider]  potassium chloride (KLOR-CON) 10 MEQ tablet Take 1 tablet (10 mEq total) by mouth daily. 06/01/20  Yes Noreene Larsson, NP  pravastatin (PRAVACHOL) 10 MG tablet Take 1 tablet (10 mg total) by mouth at bedtime. 05/19/20  Yes Aline August, MD  lidocaine (XYLOCAINE) 5 % ointment Apply 1 application topically as needed. Patient not taking: No sig reported 06/11/20   Ventura Sellers, MD  ondansetron (ZOFRAN) 8 MG tablet Take 1 tablet (8 mg total) by mouth 2 (two) times daily as needed (nausea and vomiting). May take 30-60 minutes prior to Temodar administration if nausea/vomiting occurs. 03/08/20   Vaslow, Acey Lav, MD  UNABLE TO FIND Med Name: HB Otic Drops (hydrocortisone 1%,benzocaine 2%) Sig: Fill ear canal with 3-4 drops up to 3 to 4 times daily #15 mL Patient not taking: No sig reported 06/02/20   Noreene Larsson, NP    Allergies  Patient has no known allergies.  Review of Systems   Review of Systems  Unable to perform ROS: Mental status change    Physical Exam Updated Vital Signs BP 120/72   Pulse (!) 104   Temp 99.6 F (37.6 C) (Oral)   Resp 16   Ht 5\' 8"  (1.727 m)   Wt 67.1 kg   SpO2 95%   BMI 22.49 kg/m   Physical Exam Vitals and nursing note reviewed.  Constitutional:      General: She is not in acute distress.    Appearance: She is well-developed and well-nourished. She is obese. She is not ill-appearing.  HENT:     Head: Normocephalic and atraumatic.     Right Ear: External ear normal.     Left Ear: External ear normal.     Mouth/Throat:     Mouth: Mucous membranes are moist.     Pharynx: No oropharyngeal exudate or posterior oropharyngeal erythema.  Eyes:     Extraocular Movements: EOM normal.     Conjunctiva/sclera: Conjunctivae normal.     Pupils: Pupils are equal, round, and reactive to light.  Neck:     Trachea: Phonation normal.  Cardiovascular:     Rate and Rhythm: Normal rate and regular  rhythm.     Heart sounds: Normal heart sounds.  Pulmonary:     Effort: Pulmonary effort is normal.     Breath sounds: Normal breath sounds.     Comments: Oxygen saturation low, 86% on room air. Chest:     Chest wall: No bony tenderness.  Abdominal:     Palpations: Abdomen is soft.     Tenderness: There is no abdominal tenderness.  Musculoskeletal:        General: Normal range of motion.     Cervical back: Normal range of motion and neck supple.     Comments: Able to sit up with help.  Skin:    General: Skin is warm, dry and intact.     Comments: Healing herpetic rash, right face, right pinna, right neck and shoulder. Minimal erythema associated with the rash on the right clavicular area, which may be related to scratching.  Neurological:     Mental Status: She is alert.     Cranial Nerves: No cranial nerve deficit.     Motor: No abnormal muscle tone.     Coordination: Coordination normal.     Comments: No dysarthria or aphasia. Cooperative, follows commands accurately.  Psychiatric:        Mood and Affect: Mood and affect and mood normal.        Behavior: Behavior normal.     ED Results / Procedures / Treatments   Labs (all labs ordered are listed, but only abnormal results are displayed) Labs Reviewed  COMPREHENSIVE METABOLIC PANEL - Abnormal; Notable for the following components:      Result Value   Glucose, Bld 127 (*)    Calcium 8.2 (*)    Total Protein 6.3 (*)    Albumin 2.5 (*)    All other components within normal limits  URINALYSIS, ROUTINE W REFLEX MICROSCOPIC - Abnormal; Notable for the following components:   APPearance HAZY (*)    Hgb urine dipstick SMALL (*)    Protein, ur 30 (*)    Leukocytes,Ua MODERATE (*)    Bacteria, UA MANY (*)    All other components within normal limits  CBC WITH DIFFERENTIAL/PLATELET - Abnormal; Notable for the following components:   WBC 12.7 (*)  RBC 3.30 (*)    Hemoglobin 10.6 (*)    HCT 30.9 (*)    RDW 17.8 (*)     Platelets 123 (*)    Neutro Abs 11.4 (*)    Lymphs Abs 0.6 (*)    Abs Immature Granulocytes 0.25 (*)    All other components within normal limits  RESP PANEL BY RT-PCR (FLU A&B, COVID) [id number]  URINE CULTURE  CBC WITH DIFFERENTIAL/PLATELET    EKG EKG Interpretation  Date/Time:  Saturday June 12 2020 09:02:07 EST Ventricular Rate:  116 PR Interval:    QRS Duration: 70 QT Interval:  298 QTC Calculation: 414 R Axis:   -2 Text Interpretation: Sinus tachycardia Since last tracing rate faster Otherwise no significant change Confirmed by Daleen Bo [phone]) on 06/12/2020 9:10:32 AM   Radiology DG Chest Port 1 View  Result Date: 06/12/2020 CLINICAL DATA:  PATIENT WAS BROUGHT TO ER FOR CHEST DISCOMFORT AND SOME BACK PAIN BACK. PATIENT IS GETTING OVER SHINGLES. HAS SOME AMS DUE TO BRAIN TUMOR REMOVAL. HX: NEVER SMOKED, HTN. EXAM: PORTABLE CHEST 1 VIEW COMPARISON:  05/15/2020 FINDINGS: Cardiac silhouette is normal in size. No mediastinal or hilar masses or evidence of adenopathy. Linear opacity at the left lung base, similar to the prior study, consistent with atelectasis. Prominent lung markings. No evidence of pneumonia or pulmonary edema. No convincing pleural effusion and no pneumothorax. Skeletal structures are grossly intact. IMPRESSION: No active disease. Electronically Signed   By: Lajean Manes M.D.   On: 06/12/2020 10:09    Procedures .Critical Care Performed by: Daleen Bo, MD Authorized by: Daleen Bo, MD   Critical care provider statement:    Critical care time (minutes):  35   Critical care start time:  06/12/2020 9:10 AM   Critical care end time:  06/12/2020 12:46 PM   Critical care time was exclusive of:  Separately billable procedures and treating other patients   Critical care was necessary to treat or prevent imminent or life-threatening deterioration of the following conditions:  Sepsis   Critical care was time spent personally by me on the following  activities:  Blood draw for specimens, development of treatment plan with patient or surrogate, discussions with consultants, evaluation of patient's response to treatment, examination of patient, obtaining history from patient or surrogate, ordering and performing treatments and interventions, ordering and review of laboratory studies, pulse oximetry, re-evaluation of patient's condition, review of old charts and ordering and review of radiographic studies   (including critical care time)  Medications Ordered in ED Medications  sodium chloride 0.9 % bolus 500 mL (0 mLs Intravenous Stopped 06/12/20 1137)  cefTRIAXone (ROCEPHIN) 1 g in sodium chloride 0.9 % 100 mL IVPB (1 g Intravenous New Bag/Given 06/12/20 1204)    ED Course  I have reviewed the triage vital signs and the nursing notes.  Pertinent labs & imaging results that were available during my care of the patient were reviewed by me and considered in my medical decision making (see chart for details).  Clinical Course as of 06/12/20 1244  Sat Jun 12, 2020  1147 Urinalysis consistent with UTI, Rocephin ordered, will obtain urine culture [EW]    Clinical Course User Index [EW] Daleen Bo, MD   MDM Rules/Calculators/A&P                           Patient Vitals for the past 24 hrs:  BP Temp Temp src Pulse Resp SpO2 Height Weight  06/12/20  1230 120/72 - - (!) 104 16 95 % - -  06/12/20 1200 122/71 - - (!) 109 20 95 % - -  06/12/20 1130 121/69 - - (!) 109 (!) 24 93 % - -  06/12/20 1100 114/69 - - (!) 111 11 96 % - -  06/12/20 1030 114/72 - - (!) 109 19 97 % - -  06/12/20 1000 112/68 - - (!) 111 17 95 % - -  06/12/20 0935 110/69 - - (!) 115 20 97 % - -  06/12/20 0903 117/65 99.6 F (37.6 C) Oral (!) 115 20 (!) 86 % - -  06/12/20 0859 - - - - - - 5\' 8"  (1.727 m) 67.1 kg  06/12/20 0858 - 99.6 F (37.6 C) - - - - - -    12:44 PM Reevaluation with update and discussion. After initial assessment and treatment, an updated  evaluation reveals she remains comfortableand cooperative.  Findings discussed with daughter, all questions answered. Daleen Bo   Medical Decision Making:  This patient is presenting for evaluation of vague symptoms, after being treated for herpes zoster rash, which does require a range of treatment options, and is a complaint that involves a moderate risk of morbidity and mortality. The differential diagnoses include medication intolerance, progressive symptoms, acute illness. I decided to review old records, and in summary elderly female, with history of left frontal glioblastoma, resected, June 2021. Receiving ongoing chemotherapy.  I received additional historical information from family member with patient.  Clinical Laboratory Tests Ordered, included CBC, Metabolic panel, Urinalysis and COVID and influenza testing. Review indicates white count high, hemoglobin low, glucose high, calcium low, total protein low, albumin low, COVID-negative, flu negative, urine indicates infection.. Radiologic Tests Ordered, included chest x-ray.  I independently Visualized: Radiographic images, which show no infiltrate or edema  Cardiac Monitor Tracing which shows sinus tachycardia    Critical Interventions-clinical evaluation, laboratory testing, chest x-ray, observation, parenteral antibiotics for UTI, discussion with family member, discharge  After These Interventions, the Patient was reevaluated and was found stable for discharge.  Patient with UTI causing malaise, and otherwise stable.  She has resolving zoster.  She has follow-up scheduled for 3 days from now with her neuro oncologist.  [place] Performed by: Daleen Bo  Nursing Notes Reviewed/ Care Coordinated Applicable Imaging Reviewed Interpretation of Laboratory Data incorporated into ED treatment  The patient appears reasonably screened and/or stabilized for discharge and I doubt any other medical condition or other [facility]  requiring further screening, evaluation, or treatment in the ED at this time prior to discharge.  Plan: Home Medications-continue usual; Home Treatments-rest, fluids, gradually advance diet and activity; return here if the recommended treatment, does not improve the symptoms; Recommended follow up-PCP, as needed.  Oncology in 3 days.     Final Clinical Impression(s) / ED Diagnoses Final diagnoses:  Urinary tract infection without hematuria, site unspecified  Herpes zoster without complication    Rx / DC Orders ED Discharge Orders         Ordered    amoxicillin-clavulanate (AUGMENTIN) 875-125 MG tablet  2 times daily        06/12/20 1232           Daleen Bo, MD 06/12/20 1247

## 2020-06-14 ENCOUNTER — Other Ambulatory Visit: Payer: Self-pay | Admitting: Family Medicine

## 2020-06-15 ENCOUNTER — Inpatient Hospital Stay (HOSPITAL_COMMUNITY)
Admission: EM | Admit: 2020-06-15 | Discharge: 2020-06-21 | DRG: 871 | Disposition: A | Discharge status: Skilled Nursing Facility | Payer: Medicare PPO | Attending: Family Medicine | Admitting: Family Medicine

## 2020-06-15 ENCOUNTER — Emergency Department (HOSPITAL_COMMUNITY): Payer: Medicare PPO

## 2020-06-15 ENCOUNTER — Encounter (HOSPITAL_COMMUNITY): Payer: Self-pay

## 2020-06-15 ENCOUNTER — Inpatient Hospital Stay (HOSPITAL_BASED_OUTPATIENT_CLINIC_OR_DEPARTMENT_OTHER): Payer: Medicare PPO | Admitting: Internal Medicine

## 2020-06-15 ENCOUNTER — Other Ambulatory Visit: Payer: Self-pay

## 2020-06-15 DIAGNOSIS — D649 Anemia, unspecified: Secondary | ICD-10-CM | POA: Diagnosis present

## 2020-06-15 DIAGNOSIS — Z833 Family history of diabetes mellitus: Secondary | ICD-10-CM

## 2020-06-15 DIAGNOSIS — R410 Disorientation, unspecified: Secondary | ICD-10-CM | POA: Diagnosis not present

## 2020-06-15 DIAGNOSIS — Z20822 Contact with and (suspected) exposure to covid-19: Secondary | ICD-10-CM | POA: Diagnosis not present

## 2020-06-15 DIAGNOSIS — R531 Weakness: Secondary | ICD-10-CM | POA: Diagnosis present

## 2020-06-15 DIAGNOSIS — H409 Unspecified glaucoma: Secondary | ICD-10-CM | POA: Diagnosis present

## 2020-06-15 DIAGNOSIS — B0229 Other postherpetic nervous system involvement: Secondary | ICD-10-CM

## 2020-06-15 DIAGNOSIS — R279 Unspecified lack of coordination: Secondary | ICD-10-CM | POA: Diagnosis not present

## 2020-06-15 DIAGNOSIS — T380X5D Adverse effect of glucocorticoids and synthetic analogues, subsequent encounter: Secondary | ICD-10-CM | POA: Diagnosis not present

## 2020-06-15 DIAGNOSIS — R809 Proteinuria, unspecified: Secondary | ICD-10-CM | POA: Diagnosis present

## 2020-06-15 DIAGNOSIS — A419 Sepsis, unspecified organism: Secondary | ICD-10-CM | POA: Diagnosis not present

## 2020-06-15 DIAGNOSIS — R0902 Hypoxemia: Secondary | ICD-10-CM | POA: Diagnosis not present

## 2020-06-15 DIAGNOSIS — J90 Pleural effusion, not elsewhere classified: Secondary | ICD-10-CM | POA: Diagnosis not present

## 2020-06-15 DIAGNOSIS — G8191 Hemiplegia, unspecified affecting right dominant side: Secondary | ICD-10-CM | POA: Diagnosis not present

## 2020-06-15 DIAGNOSIS — R6520 Severe sepsis without septic shock: Secondary | ICD-10-CM | POA: Diagnosis not present

## 2020-06-15 DIAGNOSIS — Z7952 Long term (current) use of systemic steroids: Secondary | ICD-10-CM

## 2020-06-15 DIAGNOSIS — B0230 Zoster ocular disease, unspecified: Secondary | ICD-10-CM | POA: Diagnosis not present

## 2020-06-15 DIAGNOSIS — E440 Moderate protein-calorie malnutrition: Secondary | ICD-10-CM | POA: Diagnosis not present

## 2020-06-15 DIAGNOSIS — Z8249 Family history of ischemic heart disease and other diseases of the circulatory system: Secondary | ICD-10-CM

## 2020-06-15 DIAGNOSIS — E785 Hyperlipidemia, unspecified: Secondary | ICD-10-CM | POA: Diagnosis present

## 2020-06-15 DIAGNOSIS — C713 Malignant neoplasm of parietal lobe: Secondary | ICD-10-CM | POA: Diagnosis not present

## 2020-06-15 DIAGNOSIS — R000 Tachycardia, unspecified: Secondary | ICD-10-CM | POA: Diagnosis not present

## 2020-06-15 DIAGNOSIS — N390 Urinary tract infection, site not specified: Secondary | ICD-10-CM | POA: Diagnosis present

## 2020-06-15 DIAGNOSIS — I10 Essential (primary) hypertension: Secondary | ICD-10-CM | POA: Diagnosis not present

## 2020-06-15 DIAGNOSIS — J9601 Acute respiratory failure with hypoxia: Secondary | ICD-10-CM | POA: Diagnosis not present

## 2020-06-15 DIAGNOSIS — E782 Mixed hyperlipidemia: Secondary | ICD-10-CM

## 2020-06-15 DIAGNOSIS — Z7983 Long term (current) use of bisphosphonates: Secondary | ICD-10-CM

## 2020-06-15 DIAGNOSIS — D696 Thrombocytopenia, unspecified: Secondary | ICD-10-CM | POA: Diagnosis not present

## 2020-06-15 DIAGNOSIS — R41841 Cognitive communication deficit: Secondary | ICD-10-CM | POA: Diagnosis not present

## 2020-06-15 DIAGNOSIS — R0602 Shortness of breath: Secondary | ICD-10-CM | POA: Diagnosis not present

## 2020-06-15 DIAGNOSIS — G9341 Metabolic encephalopathy: Secondary | ICD-10-CM | POA: Diagnosis not present

## 2020-06-15 DIAGNOSIS — R7989 Other specified abnormal findings of blood chemistry: Secondary | ICD-10-CM | POA: Diagnosis not present

## 2020-06-15 DIAGNOSIS — Z803 Family history of malignant neoplasm of breast: Secondary | ICD-10-CM

## 2020-06-15 DIAGNOSIS — Z743 Need for continuous supervision: Secondary | ICD-10-CM | POA: Diagnosis not present

## 2020-06-15 DIAGNOSIS — B028 Zoster with other complications: Secondary | ICD-10-CM | POA: Diagnosis not present

## 2020-06-15 DIAGNOSIS — R569 Unspecified convulsions: Secondary | ICD-10-CM | POA: Diagnosis not present

## 2020-06-15 DIAGNOSIS — J189 Pneumonia, unspecified organism: Secondary | ICD-10-CM

## 2020-06-15 DIAGNOSIS — E46 Unspecified protein-calorie malnutrition: Secondary | ICD-10-CM | POA: Diagnosis not present

## 2020-06-15 DIAGNOSIS — R404 Transient alteration of awareness: Secondary | ICD-10-CM | POA: Diagnosis not present

## 2020-06-15 DIAGNOSIS — Z79899 Other long term (current) drug therapy: Secondary | ICD-10-CM

## 2020-06-15 DIAGNOSIS — M6281 Muscle weakness (generalized): Secondary | ICD-10-CM | POA: Diagnosis not present

## 2020-06-15 DIAGNOSIS — G40909 Epilepsy, unspecified, not intractable, without status epilepticus: Secondary | ICD-10-CM | POA: Diagnosis not present

## 2020-06-15 DIAGNOSIS — R0689 Other abnormalities of breathing: Secondary | ICD-10-CM | POA: Diagnosis not present

## 2020-06-15 DIAGNOSIS — C719 Malignant neoplasm of brain, unspecified: Secondary | ICD-10-CM | POA: Diagnosis present

## 2020-06-15 DIAGNOSIS — R4182 Altered mental status, unspecified: Secondary | ICD-10-CM

## 2020-06-15 DIAGNOSIS — Z6822 Body mass index (BMI) 22.0-22.9, adult: Secondary | ICD-10-CM

## 2020-06-15 DIAGNOSIS — B9620 Unspecified Escherichia coli [E. coli] as the cause of diseases classified elsewhere: Secondary | ICD-10-CM | POA: Diagnosis present

## 2020-06-15 DIAGNOSIS — R739 Hyperglycemia, unspecified: Secondary | ICD-10-CM | POA: Diagnosis not present

## 2020-06-15 DIAGNOSIS — R2689 Other abnormalities of gait and mobility: Secondary | ICD-10-CM | POA: Diagnosis not present

## 2020-06-15 DIAGNOSIS — E8809 Other disorders of plasma-protein metabolism, not elsewhere classified: Secondary | ICD-10-CM | POA: Diagnosis present

## 2020-06-15 LAB — COMPREHENSIVE METABOLIC PANEL
ALT: 37 U/L (ref 0–44)
AST: 28 U/L (ref 15–41)
Albumin: 2.5 g/dL — ABNORMAL LOW (ref 3.5–5.0)
Alkaline Phosphatase: 69 U/L (ref 38–126)
Anion gap: 9 (ref 5–15)
BUN: 21 mg/dL (ref 8–23)
CO2: 27 mmol/L (ref 22–32)
Calcium: 8.4 mg/dL — ABNORMAL LOW (ref 8.9–10.3)
Chloride: 108 mmol/L (ref 98–111)
Creatinine, Ser: 0.64 mg/dL (ref 0.44–1.00)
GFR, Estimated: >60 mL/min (ref >60)
Glucose, Bld: 98 mg/dL (ref 70–99)
Potassium: 4 mmol/L (ref 3.5–5.1)
Sodium: 144 mmol/L (ref 135–145)
Total Bilirubin: 0.7 mg/dL (ref 0.3–1.2)
Total Protein: 6.7 g/dL (ref 6.5–8.1)

## 2020-06-15 LAB — CBC WITH DIFFERENTIAL/PLATELET
Abs Immature Granulocytes: 0.23 10*3/uL — ABNORMAL HIGH (ref 0.00–0.07)
Basophils Absolute: 0 10*3/uL (ref 0.0–0.1)
Basophils Relative: 0 %
Eosinophils Absolute: 0 10*3/uL (ref 0.0–0.5)
Eosinophils Relative: 0 %
HCT: 33 % — ABNORMAL LOW (ref 36.0–46.0)
Hemoglobin: 10.5 g/dL — ABNORMAL LOW (ref 12.0–15.0)
Immature Granulocytes: 3 %
Lymphocytes Relative: 9 %
Lymphs Abs: 0.6 10*3/uL — ABNORMAL LOW (ref 0.7–4.0)
MCH: 30.7 pg (ref 26.0–34.0)
MCHC: 31.8 g/dL (ref 30.0–36.0)
MCV: 96.5 fL (ref 80.0–100.0)
Monocytes Absolute: 0.3 10*3/uL (ref 0.1–1.0)
Monocytes Relative: 4 %
Neutro Abs: 5.9 10*3/uL (ref 1.7–7.7)
Neutrophils Relative %: 84 %
Platelets: 157 10*3/uL (ref 150–400)
RBC: 3.42 MIL/uL — ABNORMAL LOW (ref 3.87–5.11)
RDW: 17.8 % — ABNORMAL HIGH (ref 11.5–15.5)
WBC: 7.1 10*3/uL (ref 4.0–10.5)
nRBC: 1.1 % — ABNORMAL HIGH (ref 0.0–0.2)

## 2020-06-15 LAB — URINALYSIS, ROUTINE W REFLEX MICROSCOPIC
Bilirubin Urine: NEGATIVE
Glucose, UA: NEGATIVE mg/dL
Hgb urine dipstick: NEGATIVE
Ketones, ur: NEGATIVE mg/dL
Leukocytes,Ua: NEGATIVE
Nitrite: NEGATIVE
Protein, ur: 30 mg/dL — AB
Specific Gravity, Urine: 1.019 (ref 1.005–1.030)
pH: 6 (ref 5.0–8.0)

## 2020-06-15 LAB — D-DIMER, QUANTITATIVE: D-Dimer, Quant: 6.06 ug/mL-FEU — ABNORMAL HIGH (ref 0.00–0.50)

## 2020-06-15 LAB — URINE CULTURE: Culture: >=100000 — AB

## 2020-06-15 LAB — PROCALCITONIN: Procalcitonin: 0.2 ng/mL

## 2020-06-15 LAB — PROTIME-INR
INR: 1.2 (ref 0.8–1.2)
Prothrombin Time: 15.2 seconds (ref 11.4–15.2)

## 2020-06-15 LAB — RESP PANEL BY RT-PCR (FLU A&B, COVID) ARPGX2
Influenza A by PCR: NEGATIVE
Influenza B by PCR: NEGATIVE
SARS Coronavirus 2 by RT PCR: NEGATIVE

## 2020-06-15 LAB — APTT: aPTT: 23 seconds — ABNORMAL LOW (ref 24–36)

## 2020-06-15 LAB — LACTIC ACID, PLASMA
Lactic Acid, Venous: 1.2 mmol/L (ref 0.5–1.9)
Lactic Acid, Venous: 1 mmol/L (ref 0.5–1.9)

## 2020-06-15 MED ORDER — SODIUM CHLORIDE 0.9 % IV BOLUS
1000.0000 mL | Freq: Once | INTRAVENOUS | Status: AC
  Administered 2020-06-15: 1000 mL via INTRAVENOUS

## 2020-06-15 MED ORDER — DEXAMETHASONE 1 MG PO TABS: 1.0000 mg | ORAL_TABLET | Freq: Every day | ORAL | 1 refills | Status: DC

## 2020-06-15 MED ORDER — TEMOZOLOMIDE 180 MG PO CAPS: 200.0000 mg/m2/d | ORAL_CAPSULE | Freq: Every day | ORAL | 0 refills | Status: DC

## 2020-06-15 MED ORDER — ENSURE ENLIVE PO LIQD
237.0000 mL | Freq: Two times a day (BID) | ORAL | Status: DC
  Administered 2020-06-16 – 2020-06-21 (×11): 237 mL via ORAL
  Filled 2020-06-15 (×4): qty 237

## 2020-06-15 MED ORDER — LORAZEPAM 2 MG/ML IJ SOLN
1.0000 mg | Freq: Once | INTRAMUSCULAR | Status: AC
  Administered 2020-06-15: 1 mg via INTRAVENOUS
  Filled 2020-06-15: qty 1

## 2020-06-15 MED ORDER — LACTATED RINGERS IV SOLN: INTRAVENOUS | Status: AC

## 2020-06-15 MED ORDER — SODIUM CHLORIDE 0.9 % IV SOLN
1.0000 g | INTRAVENOUS | Status: DC
  Administered 2020-06-15 – 2020-06-21 (×6): 1 g via INTRAVENOUS
  Filled 2020-06-15 (×6): qty 10

## 2020-06-15 MED ORDER — IOHEXOL 350 MG/ML SOLN
100.0000 mL | Freq: Once | INTRAVENOUS | Status: AC | PRN
  Administered 2020-06-15: 100 mL via INTRAVENOUS

## 2020-06-15 MED ORDER — GABAPENTIN 300 MG PO CAPS
600.0000 mg | ORAL_CAPSULE | Freq: Three times a day (TID) | ORAL | Status: DC
  Administered 2020-06-16 – 2020-06-21 (×17): 600 mg via ORAL
  Filled 2020-06-15 (×17): qty 2

## 2020-06-15 MED ORDER — DEXAMETHASONE 0.5 MG PO TABS
1.0000 mg | ORAL_TABLET | Freq: Every day | ORAL | Status: DC
  Administered 2020-06-17 – 2020-06-21 (×5): 1 mg via ORAL
  Filled 2020-06-15 (×11): qty 2

## 2020-06-15 MED ORDER — LEVETIRACETAM 500 MG PO TABS
1000.0000 mg | ORAL_TABLET | Freq: Two times a day (BID) | ORAL | Status: DC
  Administered 2020-06-16 – 2020-06-21 (×12): 1000 mg via ORAL
  Filled 2020-06-15 (×12): qty 2

## 2020-06-15 MED ORDER — LACTATED RINGERS IV BOLUS (SEPSIS)
1000.0000 mL | Freq: Once | INTRAVENOUS | Status: AC
  Administered 2020-06-15: 1000 mL via INTRAVENOUS

## 2020-06-15 MED ORDER — PROCHLORPERAZINE MALEATE 10 MG PO TABS
ORAL_TABLET | ORAL | Status: AC
  Filled 2020-06-15: qty 1

## 2020-06-15 MED ORDER — PRAVASTATIN SODIUM 10 MG PO TABS
10.0000 mg | ORAL_TABLET | Freq: Every day | ORAL | Status: DC
  Administered 2020-06-16 – 2020-06-20 (×6): 10 mg via ORAL
  Filled 2020-06-15 (×6): qty 1

## 2020-06-15 MED ORDER — ACETAMINOPHEN 325 MG PO TABS
650.0000 mg | ORAL_TABLET | Freq: Once | ORAL | Status: AC
  Administered 2020-06-15: 650 mg via ORAL
  Filled 2020-06-15: qty 2

## 2020-06-15 MED ORDER — TEMOZOLOMIDE 5 MG PO CAPS: 200.0000 mg/m2/d | ORAL_CAPSULE | Freq: Every day | ORAL | Status: DC

## 2020-06-15 MED ORDER — SODIUM CHLORIDE 0.9 % IV SOLN
500.0000 mg | INTRAVENOUS | Status: DC
  Administered 2020-06-15 – 2020-06-21 (×6): 500 mg via INTRAVENOUS
  Filled 2020-06-15 (×6): qty 500

## 2020-06-15 MED ORDER — AMLODIPINE BESYLATE 5 MG PO TABS
5.0000 mg | ORAL_TABLET | Freq: Every day | ORAL | Status: DC
  Administered 2020-06-16 – 2020-06-20 (×5): 5 mg via ORAL
  Filled 2020-06-15 (×7): qty 1

## 2020-06-15 MED ORDER — ENOXAPARIN SODIUM 40 MG/0.4ML ~~LOC~~ SOLN
40.0000 mg | SUBCUTANEOUS | Status: DC
  Administered 2020-06-15: 40 mg via SUBCUTANEOUS
  Filled 2020-06-15: qty 0.4

## 2020-06-15 NOTE — ED Provider Notes (Signed)
[facility] EMERGENCY DEPARTMENT Provider Note   CSN: [id number] Arrival date & time: 06/15/20  1628    History Chief Complaint  Patient presents with  . Shortness of Breath    Wanda Ross is a 70 y.o. female with past medical history significant for postherpetic neuralgia, glioblastoma, followed by Dr. Mickeal Skinner, recently diagnosed with UTI who presents for evaluation of fever, hypoxia and tachycardia.  PT is at the house assessing patient.  They noted her to be febrile to 101, tachycardic into the 120s.  Daughter states she has been more confused since Sunday.  Physical therapy stated patient's oxygen saturation was in the 80s and recommended coming here to the emergency department.  Daughter states her postherpetic neuralgia seem to be improving with medications from Dr. Mickeal Skinner.  They are concerned about her increasing confusion.  States he did recently stop steroids on Sunday.  He is at baseline does have some confusion however has worsened.  She does not require any oxygen at home.  She is vaccinated x3 with COVID.  No recent exposures.  Family has not noted a cough.  She has been having normal bowel movements.  Does have frequent urination.   Level 5 caveat-altered mental status   Collateral from [name], daughter  More confusion since Sunday PT states oxygen low and temp Taking Abx for UTI  82% on RA with EMS, febrile, confused, tachycardic   Son Darcus Austin at [phone], will come site with patient if needed.   HPI     Past Medical History:  Diagnosis Date  . Glaucoma   . Hyperlipidemia 2010  . Hypertension 1990  . IGT (impaired glucose tolerance) 05/11/2010  . Multinodular goiter 2010   normal function    Patient Active Problem List   Diagnosis Date Noted  . Post herpetic neuralgia 06/15/2020  . Sepsis ([place]) 06/15/2020  . Zoster with other complications 27/25/3664  . Herpes zoster 05/22/2020  . Acute respiratory failure with hypoxemia ([place]) 05/14/2020   . Seizures ([place]) 05/14/2020  . Thrombocytopenia ([place]) 05/14/2020  . Lactic acidosis 05/14/2020  . Hypokalemia 05/14/2020  . Hyperglycemia 05/14/2020  . Pressure injury of skin 05/14/2020  . Goals of care, counseling/discussion 12/15/2019  . Glioblastoma ([place])   . Hypoalbuminemia due to protein-calorie malnutrition ([place])   . Transaminitis   . Benign essential HTN   . Prediabetes   . Seizure prophylaxis   . Glioma ([place]) 12/02/2019  . Right sided weakness   . Dyslipidemia   . Essential hypertension   . Steroid-induced hyperglycemia   . Leucocytosis   . Brain mass 11/22/2019  . 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 Abnormal electrocardiogram (ECG) (EKG) 12/31/2017  . White coat syndrome with diagnosis of hypertension 12/31/2017  . Glaucoma 12/26/2013  . Vitamin D deficiency 05/27/2009  . IMPAIRED GLUCOSE TOLERANCE 05/27/2009  . OSTEOPENIA 05/27/2009  . Multinodular goiter 2010  . Multiple thyroid nodules 11/06/2007  . Hyperlipidemia 11/06/2007  . 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 APPLICATION OF CRANIAL NAVIGATION Left 11/26/2019   Procedure: APPLICATION OF CRANIAL NAVIGATION;  Surgeon: [name], MD;  Location: [place];  Service: Neurosurgery;  Laterality: Left;  APPLICATION OF CRANIAL NAVIGATION  . COLONOSCOPY N/A 06/16/2016   Procedure: COLONOSCOPY;  Surgeon: Danie Binder, MD;  [facility];  Service: Endoscopy;  Laterality: N/A;  9:30 AM  . COLONOSCOPY N/A 10/22/2019   Procedure: COLONOSCOPY;  Surgeon: Daneil Dolin, MD;  [facility];  Service: Endoscopy;  Laterality: N/A;  9:30  . CRANIOTOMY Left 11/26/2019   Procedure: LEFT PARIETAL CRANIOTOMY FOR RESECTION OF TUMOR;  Surgeon: [name], MD;  Location: [place];  Service: Neurosurgery;  Laterality: Left;  LEFT PARIETAL CRANIOTOMY FOR RESECTION OF TUMOR  . EYE SURGERY  2017   3 on left eye 1 on right eye for glaucoma by Dr. Venetia Maxon   .  EYE SURGERY  2018   Cataract Surgery by Dr. Arlina Robes   . OPERATIVE ULTRASOUND N/A 11/26/2019   Procedure: OPERATIVE ULTRASOUND;  Surgeon: [name], MD;  Location: [place];  Service: Neurosurgery;  Laterality: N/A;  OPERATIVE ULTRASOUND     OB History   No obstetric history on file.     Family History  Problem Relation Age of Onset  . Dementia Father   . Dementia Mother   . Diabetes Mother   . Hypertension Mother   . Breast cancer Sister   . Hypertension Sister   . Diabetes Brother     Social History   Tobacco Use  . Smoking status: Never Smoker  . Smokeless tobacco: Never Used  Vaping Use  . Vaping Use: Never used  Substance Use Topics  . Alcohol use: No  . Drug use: No    Home Medications Prior to Admission medications   Medication Sig Start Date End Date Taking? Authorizing Provider  acetaminophen (TYLENOL) 325 MG tablet Take 2 tablets (650 mg total) by mouth every 4 (four) hours as needed for mild pain (temp > 100.5). 12/02/19   Guilford Shi, MD  alendronate (FOSAMAX) 70 MG tablet TAKE 1 TABLET (70 MG TOTAL) BY MOUTH EVERY 7 (SEVEN) DAYS. TAKE WITH A FULL GLASS OF WATER ON AN EMPTY STOMACH. 06/15/20   Fayrene Helper, MD  amLODipine (NORVASC) 5 MG tablet Take 1 tablet (5 mg total) by mouth daily. 12/09/19   Angiulli, Lavon Paganini, PA-C  amoxicillin-clavulanate (AUGMENTIN) 875-125 MG tablet Take 1 tablet by mouth 2 (two) times daily. One po bid x 7 days 06/12/20   Daleen Bo, MD  Calcium Carbonate-Vit D-Min (CALCIUM 1200) 1200-1000 MG-UNIT CHEW Chew 1,200 mg by mouth daily. 12/09/19   Angiulli, Lavon Paganini, PA-C  cloNIDine (CATAPRES) 0.3 MG tablet TAKE 1 TABLET BY MOUTH EVERYDAY AT BEDTIME Patient taking differently: Take 0.3 mg by mouth at bedtime. 12/09/19   Angiulli, Lavon Paganini, PA-C  Cyanocobalamin 2500 MCG TABS Take 2,500 mcg by mouth daily. Patient taking differently: Take 2,500 mcg by mouth at bedtime. 12/09/19   Angiulli, Lavon Paganini, PA-C  DENTA 5000 PLUS 1.1 %  CREA dental cream Take 1 application by mouth daily. 06/09/20   [provider]  dexamethasone (DECADRON) 1 MG tablet Take 1 tablet (1 mg total) by mouth daily. 06/15/20   Ventura Sellers, MD  gabapentin (NEURONTIN) 300 MG capsule Take 2 capsules (600 mg total) by mouth 3 (three) times daily. 06/10/20   Ventura Sellers, MD  levETIRAcetam (KEPPRA) 1000 MG tablet Take 1 tablet (1,000 mg total) by mouth 2 (two) times daily. 05/19/20   Aline August, MD  lidocaine (XYLOCAINE) 5 % ointment Apply 1 application topically as needed. Patient not taking: No sig reported 06/11/20   Ventura Sellers, MD  Multiple Vitamins-Minerals (CENTRUM SILVER ULTRA WOMENS) TABS Take 1 tablet by mouth daily.    [provider]  ondansetron (ZOFRAN) 8 MG tablet Take 1 tablet (8 mg total) by mouth 2 (two) times daily as needed (nausea and vomiting). May  take 30-60 minutes prior to Temodar administration if nausea/vomiting occurs. 03/08/20   Vaslow, Acey Lav, MD  potassium chloride (KLOR-CON) 10 MEQ tablet Take 1 tablet (10 mEq total) by mouth daily. 06/01/20   Noreene Larsson, NP  pravastatin (PRAVACHOL) 10 MG tablet Take 1 tablet (10 mg total) by mouth at bedtime. 05/19/20   Aline August, MD  temozolomide (TEMODAR) 180 MG capsule Take 2 capsules (360 mg total) by mouth daily. May take on an empty stomach to decrease nausea & vomiting. 06/15/20   Ventura Sellers, MD  UNABLE TO FIND Med Name: HB Otic Drops (hydrocortisone 1%,benzocaine 2%) Sig: Fill ear canal with 3-4 drops up to 3 to 4 times daily #15 mL Patient not taking: No sig reported 06/02/20   Noreene Larsson, NP    Allergies    Patient has no known allergies.  Review of Systems   Review of Systems  Unable to perform ROS: Mental status change    Physical Exam Updated Vital Signs BP (!) 148/96 (BP Location: Left Arm)   Pulse (!) 118   Temp 99.7 F (37.6 C) (Core)   Resp 14   Ht 5\' 8"  (1.727 m)   Wt 67.1 kg   SpO2 99%   BMI 22.49 kg/m    Physical Exam Vitals and nursing note reviewed.  Constitutional:      General: She is not in acute distress.    Appearance: She is well-developed and well-nourished. She is ill-appearing. She is not toxic-appearing.  HENT:     Head: Normocephalic and atraumatic.     Jaw: There is normal jaw occlusion.     Comments: Healing herpetic rash to right face. Mild excoriations, likely due to scratching, no surrounding erythema, warmth    Mouth/Throat:     Mouth: Mucous membranes are moist.  Eyes:     Pupils: Pupils are equal, round, and reactive to light.  Neck:     Trachea: Phonation normal.     Comments: Full range of motion to neck Cardiovascular:     Rate and Rhythm: Tachycardia present.     Pulses: Normal pulses and intact distal pulses.          Radial pulses are 2+ on the right side and 2+ on the left side.       Dorsalis pedis pulses are 2+ on the right side and 2+ on the left side.     Heart sounds: Normal heart sounds.  Pulmonary:     Effort: Pulmonary effort is normal. Tachypnea present. No respiratory distress.     Comments: Coarse lung sounds bilaterally Chest:     Comments: Equal rise and fall to chest wall Abdominal:     General: Bowel sounds are normal. There is no distension.     Palpations: Abdomen is soft.     Tenderness: There is no abdominal tenderness. There is no right CVA tenderness, left CVA tenderness, guarding or rebound. Negative signs include Murphy's sign and McBurney's sign.     Hernia: No hernia is present.     Comments: Soft, nontender without rebound or guarding  Musculoskeletal:        General: Normal range of motion.     Cervical back: Full passive range of motion without pain and normal range of motion.     Right lower leg: No tenderness. No edema.     Left lower leg: No tenderness. No edema.     Comments: Moves all four extremities without difficulty.  Skin:  General: Skin is warm and dry.     Capillary Refill: Capillary refill takes less  than 2 seconds.     Comments: Postherpetic neuralgia rash to right face, right clavicle Tactile temperature to extremities  Neurological:     Mental Status: She is confused.     Comments: Alert to name, DOB, Location- [place], year 2012 Follows some commands, seems to be repetitive.   Psychiatric:        Mood and Affect: Mood and affect normal.     ED Results / Procedures / Treatments   Labs (all labs ordered are listed, but only abnormal results are displayed) Labs Reviewed  COMPREHENSIVE METABOLIC PANEL - Abnormal; Notable for the following components:      Result Value   Calcium 8.4 (*)    Albumin 2.5 (*)    All other components within normal limits  CBC WITH DIFFERENTIAL/PLATELET - Abnormal; Notable for the following components:   RBC 3.42 (*)    Hemoglobin 10.5 (*)    HCT 33.0 (*)    RDW 17.8 (*)    nRBC 1.1 (*)    Lymphs Abs 0.6 (*)    Abs Immature Granulocytes 0.23 (*)    All other components within normal limits  URINALYSIS, ROUTINE W REFLEX MICROSCOPIC - Abnormal; Notable for the following components:   Protein, ur 30 (*)    Bacteria, UA RARE (*)    All other components within normal limits  D-DIMER, QUANTITATIVE (NOT AT [facility]) - Abnormal; Notable for the following components:   D-Dimer, Quant 6.06 (*)    All other components within normal limits  APTT - Abnormal; Notable for the following components:   aPTT 23 (*)    All other components within normal limits  CULTURE, BLOOD (ROUTINE X 2)  RESP PANEL BY RT-PCR (FLU A&B, COVID) ARPGX2  CULTURE, BLOOD (ROUTINE X 2)  URINE CULTURE  LACTIC ACID, PLASMA  PROTIME-INR  LACTIC ACID, PLASMA    EKG None ED ECG REPORT   Date: 06/15/2020  EKG Time: 8:42 PM  Rate: Sinus tachycardia  Rhythm: sinus tachycardia,  normal EKG, normal sinus rhythm, unchanged from previous tracings  Axis:   Intervals:none  ST&T Change: none  Narrative Interpretation: Sinus tachycardia           Radiology CT Head Wo  Contrast  Result Date: 06/15/2020 CLINICAL DATA:  Delirium. Additional history provided: Patient with shortness of breath, febrile, confusion, recently diagnosed with UTI. Additional history obtained from Wanda NUMBERHistory of glioblastoma multiform a and seizures. EXAM: CT HEAD WITHOUT CONTRAST TECHNIQUE: Contiguous axial images were obtained from the base of the skull through the vertex without intravenous contrast. COMPARISON:  Prior brain MRI examinations 05/14/2020 and earlier. Prior head CT examinations 05/14/2020 and earlier. FINDINGS: Brain: Redemonstrated resection cavity within the left parietal lobe and callosal splenium. As before, there is white matter hypoattenuation surrounding the resection cavity, within the callosal splenium and within the posterior cerebral hemispheres bilaterally. This corresponds with the T2/FLAIR hyperintense signal abnormality on the prior brain MRI of 05/14/2020. There is a subtly hyperdense 10 mm nodular focus just to the right of midline along the callosal splenium. This corresponds with the enhancing nodule demonstrated on the prior MRI. Mild ill-defined hypoattenuation elsewhere within the cerebral white matter is nonspecific, but compatible with chronic small vessel ischemic disease. There is no evidence of acute intracranial hemorrhage. No acute demarcated cortical infarct. No extra-axial fluid collection. No midline shift. Vascular: No hyperdense vessel. Skull: Prior left parietal  craniotomy/cranioplasty. No calvarial fracture. Sinuses/Orbits: Visualized orbits show no acute finding. Air-fluid level within the right sphenoid sinus. Mild bilateral ethmoid and left sphenoid sinus mucosal thickening. Other: Right mastoid effusion. IMPRESSION: No evidence of acute infarction or acute intracranial hemorrhage. Redemonstrated surgical cavity within the left parietal lobe and callosal splenium at site of prior brain tumor resection. There is nonspecific  matter hypoattenuation surrounding the resection cavity, within the callosal splenium and within the posterior cerebral hemispheres bilaterally. This corresponds with the T2/FLAIR hyperintense signal abnormality at these sites on the prior brain MRI of 05/14/2020. A 10 mm subtly hyperdense focus is present just to the right of midline along the callosal splenium. This likely reflects the nodular enhancing focus demonstrated on the prior MRI and this is suspicious for residual viable tumor. Stable background mild cerebral white matter chronic small vessel ischemic disease. Paranasal sinus disease as described. Correlate for acute sinusitis. Right mastoid effusion. Electronically Signed   By: Jackey LogeKyle  Golden DO   On: 06/15/2020 19:55   CT Angio Chest PE W/Cm &/Or Wo Cm  Result Date: 06/15/2020 CLINICAL DATA:  Shortness of breath and febrile EXAM: CT ANGIOGRAPHY CHEST WITH CONTRAST TECHNIQUE: Multidetector CT imaging of the chest was performed using the standard protocol during bolus administration of intravenous contrast. Multiplanar CT image reconstructions and MIPs were obtained to evaluate the vascular anatomy. CONTRAST:  100mL OMNIPAQUE IOHEXOL 350 MG/ML SOLN COMPARISON:  Chest x-ray 06/15/2020, CT chest 11/23/2019 FINDINGS: Cardiovascular: Satisfactory opacification of the pulmonary arteries to the segmental level. No evidence of pulmonary embolism. Nonaneurysmal aorta. No dissection is seen. Normal cardiac size. No significant pericardial effusion. Mediastinum/Nodes: 1.1 cm hypodense nodule in the right lobe of the thyroid, no further workup recommended based on size of lesion and age of patient. Midline trachea. No suspicious adenopathy. Esophagus within normal limits Lungs/Pleura: Small right-sided pleural effusion. Right medial apical ovoid hypodensity measuring 2.4 cm is of fluid density and may represent a small amount of loculated fluid at the right apex. Hazy bilateral lung densities. Partial  consolidation in the right greater than left lower lobes. Small foci of consolidation and ground-glass density in the lingula. No pneumothorax. Upper Abdomen: Stable small hyperenhancing focus in the left hepatic lobe. No acute abnormality. Musculoskeletal: Degenerative changes of the spine. No acute or suspicious osseous abnormality. Review of the MIP images confirms the above findings. IMPRESSION: 1. Negative for acute pulmonary embolus or aortic dissection. 2. Small right-sided pleural effusion. Partial consolidation in the right greater than left lower lobes with additional small foci of consolidation and ground-glass density in the lingula, possible bilateral pneumonia. 3. 2.4 cm right medial apical hypodensity of fluid attenuation, possibly representing small loculated apical fluid. Attention on follow-up imaging. Electronically Signed   By: Jasmine PangKim  Fujinaga M.D.   On: 06/15/2020 20:00   DG Chest Port 1 View  Result Date: 06/15/2020 CLINICAL DATA:  Shortness of breath and possible sepsis EXAM: PORTABLE CHEST 1 VIEW COMPARISON:  06/12/2020 FINDINGS: Cardiac shadow is stable. The lungs are well aerated bilaterally. New right basilar airspace opacity is noted consistent with atelectasis or early infiltrate. No other focal airspace opacity is noted. No bony abnormality is seen. IMPRESSION: Likely early infiltrate in the right base. Electronically Signed   By: Alcide CleverMark  Lukens M.D.   On: 06/15/2020 18:49    Procedures .Critical Care Performed by: Linwood DibblesHenderly, Lanora Reveron A, PA-C Authorized by: Linwood DibblesHenderly, Kaijah Abts A, PA-C   Critical care provider statement:    Critical care time (minutes):  45  Critical care was necessary to treat or prevent imminent or life-threatening deterioration of the following conditions:  Respiratory failure and sepsis   Critical care was time spent personally by me on the following activities:  Discussions with consultants, evaluation of patient's response to treatment, examination of  patient, ordering and performing treatments and interventions, ordering and review of laboratory studies, ordering and review of radiographic studies, pulse oximetry, re-evaluation of patient's condition, obtaining history from patient or surrogate and review of old charts   (including critical care time)  Medications Ordered in ED Medications  lactated ringers infusion (has no administration in time range)  cefTRIAXone (ROCEPHIN) 1 g in sodium chloride 0.9 % 100 mL IVPB (0 g Intravenous Stopped 06/15/20 1808)  azithromycin (ZITHROMAX) 500 mg in sodium chloride 0.9 % 250 mL IVPB (500 mg Intravenous New Bag/Given 06/15/20 2002)  lactated ringers bolus 1,000 mL (1,000 mLs Intravenous New Bag/Given 06/15/20 1708)  acetaminophen (TYLENOL) tablet 650 mg (650 mg Oral Given 06/15/20 1726)  sodium chloride 0.9 % bolus 1,000 mL (1,000 mLs Intravenous New Bag/Given 06/15/20 1821)  LORazepam (ATIVAN) injection 1 mg (1 mg Intravenous Given 06/15/20 1817)  iohexol (OMNIPAQUE) 350 MG/ML injection 100 mL (100 mLs Intravenous Contrast Given 06/15/20 1910)   ED Course  I have reviewed the triage vital signs and the nursing notes.  Pertinent labs & imaging results that were available during my care of the patient were reviewed by me and considered in my medical decision making (see chart for details).  70 year old, recent diagnosis of UTI presents for evaluation of fever, hypoxia and confusion.  Does have history of glioblastoma.  On arrival patient febrile, tachycardic and hypoxic.  She is vaccinated for COVID.  Had a test on Saturday which was negative.  Patient denies any complaints.  Heart and lungs clear.  Abdomen soft, nontender.  We will plan on labs imaging and reassess.  Code sepsis called.  Did review her urine culture which shows sensitivity to Rocephin. Patient chewing on cord when walking in room,   Will give Rocephin here given known UTI.  Labs and imaging personally reviewed and interpreted:  CBC  without leukocytosis CMP without electrolyte, renal or liver abnormality Lactic acid 1.2 UA with rare bacteria, hyaline cast present. D-dimer 6.06 DG chest with RLL PNA COVID negative  Notify by nursing removing oxygen and monitoring equipment.  Become increasingly agitated.  She is still persistently tachycardic.  Orders placed for additional IV fluids as well as small dose of Ativan for agitation. Currently on 6 L [place].  Patient reassessed. On 6 L via nasal cannula. Given Rocephin and azithromycin for right lower lung pneumonia on chest x-ray.  CTA negative for PE. Does show possible bilateral pneumonia. CT head does show cavity from prior tumor resection. Possible residual tumor. No acute infarct or bleed   Patient critically ill with acute hypoxic respiratory failure as well as altered mental status. Will need to be admission for further management.  CONSULT with Dr. Josephine Cables with [facility] who agrees to evaluate patient for admission  The patient appears reasonably stabilized for admission considering the current resources, flow, and capabilities available in the ED at this time, and I doubt any other [facility] requiring further screening and/or treatment in the ED prior to admission.  Patient seen eval by attending, Dr. Kathrynn Humble who agrees with above treatment, plan and disposition.     MDM Rules/Calculators/A&P  TZIPPORA GILLEO was evaluated in Emergency Department on 06/15/2020 for the symptoms described in the history of present illness. She was evaluated in the context of the global COVID-19 pandemic, which necessitated consideration that the patient might be at risk for infection with the SARS-CoV-2 virus that causes COVID-19. Institutional protocols and algorithms that pertain to the evaluation of patients at risk for COVID-19 are in a state of rapid change based on information released by regulatory bodies including the CDC and federal and state organizations.  These policies and algorithms were followed during the patient's care in the ED. Final Clinical Impression(s) / ED Diagnoses Final diagnoses:  Sepsis with acute hypoxic respiratory failure without septic shock, due to unspecified organism [facility])  Acute respiratory failure with hypoxia (HCC)  Altered mental status, unspecified altered mental status type  Glioblastoma [facility])  Community acquired pneumonia, bilateral    Rx / DC Orders ED Discharge Orders    None       Jameela Michna A, PA-C 06/15/20 2042    Varney Biles, MD 06/16/20 [phone]

## 2020-06-15 NOTE — ED Notes (Addendum)
Pt frequently removing oxygen and monitoring equipment. Pt redirected and educated on monitoring equipment purpose. Pt states "Okay. I just want to feel better." Lab at bedside at this time. PA made aware of vital signs post fluid therapy and restlessness. Awaiting a page back at this time.

## 2020-06-15 NOTE — ED Notes (Signed)
Admitting provider at bedside at this time.

## 2020-06-15 NOTE — Progress Notes (Signed)
I connected with Wanda Ross on 06/15/20 at 11:30 AM EST by telephone visit and verified that I am speaking with the correct person using two identifiers.  I discussed the limitations, risks, security and privacy concerns of performing an evaluation and management service by telemedicine and the availability of in-person appointments. I also discussed with the patient that there may be a patient responsible charge related to this service. The patient expressed understanding and agreed to proceed.  Other persons participating in the visit and their role in the encounter:  daughter  Patient's location:  Home  Provider's location:  Office  Chief Complaint:  Glioblastoma ([place]) - Plan: temozolomide (TEMODAR) 180 MG capsule  Post herpetic neuralgia  History of Present Ilness: Wanda Ross describes improved control of facial pain with the 600mg  TID dosing of the gabapentin.  She continues to touch at her face and ear frequently, sometimes triggering pain episodes.  Recently went to the ED with urethritis symptoms, found UTI and was discharged with 7 days course of augmentin starting on 1/15.  Daughter feels her confusion and short term memory are a little worse since stopping the steroid on Sunday.     Observations: Language and cognition at baseline, history provided by daughter Assessment and Plan: Glioblastoma ([place]) - Plan: temozolomide (TEMODAR) 180 MG capsule  Post herpetic neuralgia  Will recommend delaying initiation of cycle #3 TMZ (see last note) until 06/20/20, to allow full course of antibiotics.    May continue gabapentin 600mg  TID for post-herpetic neuralgia.  Will resume decadron at lower dose, just 1mg  daily.  Follow Up Instructions: RTC in 5 weeks prior to cycle #4, or sooner if needed  I discussed the assessment and treatment plan with the patient.  The patient was provided an opportunity to ask questions and all were answered.  The patient agreed with the plan and  demonstrated understanding of the instructions.    The patient was advised to call back or seek an in-person evaluation if the symptoms worsen or if the condition fails to improve as anticipated.  I provided 5-10 minutes of non-face-to-face time during this enocunter.  Ventura Sellers, MD   I provided 25 minutes of non face-to-face telephone visit time during this encounter, and > 50% was spent counseling as documented under my assessment & plan.

## 2020-06-15 NOTE — ED Notes (Signed)
This RN to bedside for rounding, notes that patient is restless and pulling at tubes, lines and drains. Pt repositioned, redirected and provided a warm blanket. Pt became settled and calm, currently resting in bed with no signs of acute distress. Bed is locked in the lowest position, side rails x2, call bell within reach. Room darkened and curtain opened to promote rest and continue close observation.

## 2020-06-15 NOTE — ED Notes (Signed)
Family at bedside at this time

## 2020-06-15 NOTE — ED Notes (Signed)
Pt appears to be resting in bed, continues to remove oxygen but remains redirectable. Pt reoriented to place, time and situation, room darkened and curtain opened for better observation.

## 2020-06-15 NOTE — ED Notes (Signed)
Pt in CT at this time.

## 2020-06-15 NOTE — ED Notes (Signed)
Lab at bedside at this time.  

## 2020-06-15 NOTE — H&P (Signed)
History and Physical  Wanda Ross HKV:[id number] DOB: Jul 26, 1950 DOA: 06/15/2020  Referring physician: Nettie Elm, PA-C PCP: Fayrene Helper, MD  Patient coming from: Home  Chief Complaint: Shortness of breath  HPI: Wanda Ross is a 70 y.o. female with medical history significant for Glioblastoma ( followed by Dr. Mickeal Skinner), Seizures, Hypertension and hyperlipidemia who presents to the emergency department via EMS due to shortness of breath and increased confusion.  Patient was seen by physical therapy at home today and she was noted to be febrile with a temperature of 101F, she was also tachycardic (HR in 120s) and presented with increased shortness of breath, PulseOx was checked and was noted to be in low 80s, so EMS was activated and patient was sent to the ED for further evaluation and management. Patient was seen in the ED on 1/15 and she was diagnosed to have UTI, she was treated with IV ceftriaxone in the ED and discharged home with Augmentin. She was admitted on 12/25 and discharged on 05/25/2020 due to facial herpes zoster involving the right eye and was treated with IV acyclovir until 12/28, when she was transitioned to Valtrex 100 mg 3 times daily for additional 5 days to complete course of therapy. She lives at home alone, but her children were always visiting her at home.  ED Course:  In the emergency department, she was febrile and tachycardic.  O2 sat was 95% on supplemental oxygen via [place] at 6 LPM.  Work-up in the ED showed normocytic anemia, D-dimer 6.06, albumin 2.5, urinalysis showed proteinuria and rare bacteria.  Lactic acid 1.2.  Respiratory panel for influenza A, B and SARS coronavirus 2 was negative.  Urine culture collected on 06/12/2020 showed Escherichia Coli CT angiography chest with contrast was negative for acute pulmonary embolus or aortic dissection, but showed small right-sided pleural effusion and partial consolidation in the right greater than  left lower lobes with additional small foci of consolidation and ground-glass density in the lingula, possible bilateral pneumonia. Chest x-ray showed likely early infiltrate in the right base CTA head without contrast showed no evidence of acute infarction or acute intracranial hemorrhage She was treated with IV antibiotics, IV Ativan 1 mg x 1 was given, Tylenol was given and IV hydration was provided.  Hospitalist was asked to admit patient for further evaluation and management.  Review of Systems: This cannot be obtained at this time due to the patient's altered mental status  Past Medical History:  Diagnosis Date  . Glaucoma   . Hyperlipidemia 2010  . Hypertension 1990  . IGT (impaired glucose tolerance) 05/11/2010  . Multinodular goiter 2010   normal function   Past Surgical History:  Procedure Laterality Date  . APPLICATION OF CRANIAL NAVIGATION Left 11/26/2019   Procedure: APPLICATION OF CRANIAL NAVIGATION;  Surgeon: [name], MD;  Location: [facility];  Service: Neurosurgery;  Laterality: Left;  APPLICATION OF CRANIAL NAVIGATION  . COLONOSCOPY N/A 06/16/2016   Procedure: COLONOSCOPY;  Surgeon: Danie Binder, MD;  [facility];  Service: Endoscopy;  Laterality: N/A;  9:30 AM  . COLONOSCOPY N/A 10/22/2019   Procedure: COLONOSCOPY;  Surgeon: Daneil Dolin, MD;  [facility];  Service: Endoscopy;  Laterality: N/A;  9:30  . CRANIOTOMY Left 11/26/2019   Procedure: LEFT PARIETAL CRANIOTOMY FOR RESECTION OF TUMOR;  Surgeon: [name], MD;  Location: [facility];  Service: Neurosurgery;  Laterality: Left;  LEFT PARIETAL CRANIOTOMY FOR RESECTION OF TUMOR  . EYE  SURGERY  2017   3 on left eye 1 on right eye for glaucoma by Dr. Venetia Maxon   . EYE SURGERY  2018   Cataract Surgery by Dr. Arlina Robes   . OPERATIVE ULTRASOUND N/A 11/26/2019   Procedure: OPERATIVE ULTRASOUND;  Surgeon: [name], MD;  Location: [facility];  Service: Neurosurgery;  Laterality: N/A;   OPERATIVE ULTRASOUND    Social History:  reports that she has never smoked. She has never used smokeless tobacco. She reports that she does not drink alcohol and does not use drugs.   No Known Allergies  Family History  Problem Relation Age of Onset  . Dementia Father   . Dementia Mother   . Diabetes Mother   . Hypertension Mother   . Breast cancer Sister   . Hypertension Sister   . Diabetes Brother      Prior to Admission medications   Medication Sig Start Date End Date Taking? Authorizing Provider  acetaminophen (TYLENOL) 325 MG tablet Take 2 tablets (650 mg total) by mouth every 4 (four) hours as needed for mild pain (temp > 100.5). 12/02/19   Guilford Shi, MD  alendronate (FOSAMAX) 70 MG tablet TAKE 1 TABLET (70 MG TOTAL) BY MOUTH EVERY 7 (SEVEN) DAYS. TAKE WITH A FULL GLASS OF WATER ON AN EMPTY STOMACH. 06/15/20   Fayrene Helper, MD  amLODipine (NORVASC) 5 MG tablet Take 1 tablet (5 mg total) by mouth daily. 12/09/19   Angiulli, Lavon Paganini, PA-C  amoxicillin-clavulanate (AUGMENTIN) 875-125 MG tablet Take 1 tablet by mouth 2 (two) times daily. One po bid x 7 days 06/12/20   Daleen Bo, MD  Calcium Carbonate-Vit D-Min (CALCIUM 1200) 1200-1000 MG-UNIT CHEW Chew 1,200 mg by mouth daily. 12/09/19   Angiulli, Lavon Paganini, PA-C  cloNIDine (CATAPRES) 0.3 MG tablet TAKE 1 TABLET BY MOUTH EVERYDAY AT BEDTIME Patient taking differently: Take 0.3 mg by mouth at bedtime. 12/09/19   Angiulli, Lavon Paganini, PA-C  Cyanocobalamin 2500 MCG TABS Take 2,500 mcg by mouth daily. Patient taking differently: Take 2,500 mcg by mouth at bedtime. 12/09/19   Angiulli, Lavon Paganini, PA-C  DENTA 5000 PLUS 1.1 % CREA dental cream Take 1 application by mouth daily. 06/09/20   [provider]  dexamethasone (DECADRON) 1 MG tablet Take 1 tablet (1 mg total) by mouth daily. 06/15/20   Ventura Sellers, MD  gabapentin (NEURONTIN) 300 MG capsule Take 2 capsules (600 mg total) by mouth 3 (three) times daily. 06/10/20    Ventura Sellers, MD  levETIRAcetam (KEPPRA) 1000 MG tablet Take 1 tablet (1,000 mg total) by mouth 2 (two) times daily. 05/19/20   Aline August, MD  lidocaine (XYLOCAINE) 5 % ointment Apply 1 application topically as needed. Patient not taking: No sig reported 06/11/20   Ventura Sellers, MD  Multiple Vitamins-Minerals (CENTRUM SILVER ULTRA WOMENS) TABS Take 1 tablet by mouth daily.    [provider]  ondansetron (ZOFRAN) 8 MG tablet Take 1 tablet (8 mg total) by mouth 2 (two) times daily as needed (nausea and vomiting). May take 30-60 minutes prior to Temodar administration if nausea/vomiting occurs. 03/08/20   Vaslow, Acey Lav, MD  potassium chloride (KLOR-CON) 10 MEQ tablet Take 1 tablet (10 mEq total) by mouth daily. 06/01/20   Noreene Larsson, NP  pravastatin (PRAVACHOL) 10 MG tablet Take 1 tablet (10 mg total) by mouth at bedtime. 05/19/20   Aline August, MD  temozolomide (TEMODAR) 180 MG capsule Take 2 capsules (360 mg total) by mouth  daily. May take on an empty stomach to decrease nausea & vomiting. 06/15/20   Ventura Sellers, MD  UNABLE TO FIND Med Name: HB Otic Drops (hydrocortisone 1%,benzocaine 2%) Sig: Fill ear canal with 3-4 drops up to 3 to 4 times daily #15 mL Patient not taking: No sig reported 06/02/20   Noreene Larsson, NP    Physical Exam: BP 137/84 (BP Location: Right Arm)   Pulse (!) 114   Temp 99.5 F (37.5 C) (Core)   Resp 20   Ht 5\' 8"  (1.727 m)   Wt 67.1 kg   SpO2 96%   BMI 22.49 kg/m   . General: 70 y.o. year-old female healed appearing but in no acute distress.  Alert and oriented x to person and time. Marland Kitchen HEENT: NCAT, EOMI. right-sided healing herpetic facial rash noted . Neck: Supple, trachea medial . Cardiovascular: Tachycardia.  Regular rate and rhythm with no rubs or gallops.  No thyromegaly or JVD noted.  No lower extremity edema. 2/4 pulses in all 4 extremities. Marland Kitchen Respiratory: Bilateral coarse breath sounds lower lobes.  . Abdomen: Soft  nontender nondistended with normal bowel sounds x4 quadrants. . Muskuloskeletal: No cyanosis, clubbing or edema noted bilaterally . Neuro: Confused, sensation intact.  Further neurological exam cannot be obtained at this time due to patient's current condition  . Skin: No ulcerative lesions noted or rashes . Psychiatry: Mood is appropriate for condition and setting          Labs on Admission:  Basic Metabolic Panel: Recent Labs  Lab 06/12/20 0945 06/15/20 1700  NA 136 144  K 3.6 4.0  CL 100 108  CO2 25 27  GLUCOSE 127* 98  BUN 22 21  CREATININE 0.76 0.64  CALCIUM 8.2* 8.4*   Liver Function Tests: Recent Labs  Lab 06/12/20 0945 06/15/20 1700  AST 20 28  ALT 33 37  ALKPHOS 63 69  BILITOT 1.1 0.7  PROT 6.3* 6.7  ALBUMIN 2.5* 2.5*   No results for input(s): LIPASE, AMYLASE in the last 168 hours. No results for input(s): AMMONIA in the last 168 hours. CBC: Recent Labs  Lab 06/12/20 0945 06/15/20 1700  WBC 12.7* 7.1  NEUTROABS 11.4* 5.9  HGB 10.6* 10.5*  HCT 30.9* 33.0*  MCV 93.6 96.5  PLT 123* 157   Cardiac Enzymes: No results for input(s): CKTOTAL, CKMB, CKMBINDEX, TROPONINI in the last 168 hours.  BNP (last 3 results) No results for input(s): BNP in the last 8760 hours.  ProBNP (last 3 results) No results for input(s): PROBNP in the last 8760 hours.  CBG: No results for input(s): GLUCAP in the last 168 hours.  Radiological Exams on Admission: CT Head Wo Contrast  Result Date: 06/15/2020 CLINICAL DATA:  Delirium. Additional history provided: Patient with shortness of breath, febrile, confusion, recently diagnosed with UTI. Additional history obtained from [place] NUMBERHistory of glioblastoma multiform a and seizures. EXAM: CT HEAD WITHOUT CONTRAST TECHNIQUE: Contiguous axial images were obtained from the base of the skull through the vertex without intravenous contrast. COMPARISON:  Prior brain MRI examinations 05/14/2020 and earlier. Prior  head CT examinations 05/14/2020 and earlier. FINDINGS: Brain: Redemonstrated resection cavity within the left parietal lobe and callosal splenium. As before, there is white matter hypoattenuation surrounding the resection cavity, within the callosal splenium and within the posterior cerebral hemispheres bilaterally. This corresponds with the T2/FLAIR hyperintense signal abnormality on the prior brain MRI of 05/14/2020. There is a subtly hyperdense 10 mm nodular focus just to  the right of midline along the callosal splenium. This corresponds with the enhancing nodule demonstrated on the prior MRI. Mild ill-defined hypoattenuation elsewhere within the cerebral white matter is nonspecific, but compatible with chronic small vessel ischemic disease. There is no evidence of acute intracranial hemorrhage. No acute demarcated cortical infarct. No extra-axial fluid collection. No midline shift. Vascular: No hyperdense vessel. Skull: Prior left parietal craniotomy/cranioplasty. No calvarial fracture. Sinuses/Orbits: Visualized orbits show no acute finding. Air-fluid level within the right sphenoid sinus. Mild bilateral ethmoid and left sphenoid sinus mucosal thickening. Other: Right mastoid effusion. IMPRESSION: No evidence of acute infarction or acute intracranial hemorrhage. Redemonstrated surgical cavity within the left parietal lobe and callosal splenium at site of prior brain tumor resection. There is nonspecific matter hypoattenuation surrounding the resection cavity, within the callosal splenium and within the posterior cerebral hemispheres bilaterally. This corresponds with the T2/FLAIR hyperintense signal abnormality at these sites on the prior brain MRI of 05/14/2020. A 10 mm subtly hyperdense focus is present just to the right of midline along the callosal splenium. This likely reflects the nodular enhancing focus demonstrated on the prior MRI and this is suspicious for residual viable tumor. Stable background mild  cerebral white matter chronic small vessel ischemic disease. Paranasal sinus disease as described. Correlate for acute sinusitis. Right mastoid effusion. Electronically Signed   By: Kellie Simmering DO   On: 06/15/2020 19:55   CT Angio Chest PE W/Cm &/Or Wo Cm  Result Date: 06/15/2020 CLINICAL DATA:  Shortness of breath and febrile EXAM: CT ANGIOGRAPHY CHEST WITH CONTRAST TECHNIQUE: Multidetector CT imaging of the chest was performed using the standard protocol during bolus administration of intravenous contrast. Multiplanar CT image reconstructions and MIPs were obtained to evaluate the vascular anatomy. CONTRAST:  148mL OMNIPAQUE IOHEXOL 350 MG/ML SOLN COMPARISON:  Chest x-ray 06/15/2020, CT chest 11/23/2019 FINDINGS: Cardiovascular: Satisfactory opacification of the pulmonary arteries to the segmental level. No evidence of pulmonary embolism. Nonaneurysmal aorta. No dissection is seen. Normal cardiac size. No significant pericardial effusion. Mediastinum/Nodes: 1.1 cm hypodense nodule in the right lobe of the thyroid, no further workup recommended based on size of lesion and age of patient. Midline trachea. No suspicious adenopathy. Esophagus within normal limits Lungs/Pleura: Small right-sided pleural effusion. Right medial apical ovoid hypodensity measuring 2.4 cm is of fluid density and may represent a small amount of loculated fluid at the right apex. Hazy bilateral lung densities. Partial consolidation in the right greater than left lower lobes. Small foci of consolidation and ground-glass density in the lingula. No pneumothorax. Upper Abdomen: Stable small hyperenhancing focus in the left hepatic lobe. No acute abnormality. Musculoskeletal: Degenerative changes of the spine. No acute or suspicious osseous abnormality. Review of the MIP images confirms the above findings. IMPRESSION: 1. Negative for acute pulmonary embolus or aortic dissection. 2. Small right-sided pleural effusion. Partial consolidation in  the right greater than left lower lobes with additional small foci of consolidation and ground-glass density in the lingula, possible bilateral pneumonia. 3. 2.4 cm right medial apical hypodensity of fluid attenuation, possibly representing small loculated apical fluid. Attention on follow-up imaging. Electronically Signed   By: Donavan Foil M.D.   On: 06/15/2020 20:00   DG Chest Port 1 View  Result Date: 06/15/2020 CLINICAL DATA:  Shortness of breath and possible sepsis EXAM: PORTABLE CHEST 1 VIEW COMPARISON:  06/12/2020 FINDINGS: Cardiac shadow is stable. The lungs are well aerated bilaterally. New right basilar airspace opacity is noted consistent with atelectasis or early infiltrate. No other focal  airspace opacity is noted. No bony abnormality is seen. IMPRESSION: Likely early infiltrate in the right base. Electronically Signed   By: Inez Catalina M.D.   On: 06/15/2020 18:49    EKG: I independently viewed the EKG done and my findings are as followed: Sinus tachycardia at a rate of 124 bpm  Assessment/Plan Present on Admission: . Sepsis ([place]) . Hypoalbuminemia due to protein-calorie malnutrition ([place]) . Hyperlipidemia . Essential hypertension . Glioblastoma ([place]) . Post herpetic neuralgia  Principal Problem:   Sepsis ([place]) Active Problems:   Hyperlipidemia   Essential hypertension   Hypoalbuminemia due to protein-calorie malnutrition ([place])   Glioblastoma ([facility])   Acute respiratory failure with hypoxia ([facility])   Seizures ([facility])   Post herpetic neuralgia   CAP (community acquired pneumonia)   Acute metabolic encephalopathy   Pleural effusion on right   Elevated d-dimer   Acute metabolic encephalopathy possibly secondary to acute respiratory failure with hypoxia due to sepsis secondary to presumed CAP POA CT angiography of chest was suggestive of bilateral pneumonia She was started on IV ceftriaxone and azithromycin, we will continue with same at this time with plan to  de-escalate/discontinue based on procalcitonin, blood culture, urine Legionella and strep pneumo PORT/PSI of 121 points  indicating 8.2-9.3% mortality Continue Mucinex, incentive spirometry, flutter valve  Continue Tylenol as needed for fever  Right-sided pleural effusion CT angiography of chest showed right-sided pleural effusion IR will be consulted for possible thoracentesis in the morning  UTI  Patient was recently diagnosed with a UTI and started on Augmentin Urine culture done on 1/15 was positive for E. Coli Urinalysis unimpressive for UTI at this time Any subsequent UTI will be covered by IV ceftriaxone as indicated above for CAP POA  Elevated D-dimer D-dimer 6.06; CT angiography of chest was negative for pulmonary embolism or aortic dissection  Essential hypertension Continue Norvasc  Hyperlipidemia Continue pravastatin  History of glioblastoma Continue Decadron and Temodar  Seizures Continue Keppra  Hypoalbuminemia due to moderate protein calorie malnutrition Albumin 2.5, protein supplement reported  Postherpetic neuralgia Continue Neurontin  DVT prophylaxis: SCDs (chemoprophylaxis held due to possible thoracentesis in the morning)  Code Status: Full code  Family Communication: Son at bedside (all questions answered to satisfaction)  Disposition Plan:  Patient is from:                        home Anticipated DC to:                   SNF or family members home Anticipated DC date:               2-3 days Anticipated DC barriers:           Patient is unstable to be discharged at this time due to sepsis due to CAP POA requiring inpatient treatment  Consults called: IR  Admission status: Inpatient  Bernadette Hoit MD [facility]  06/15/2020, 10:54 PM

## 2020-06-15 NOTE — ED Triage Notes (Signed)
Patient to ED via EMS after being called out for [facility]. Sats 82% on RA when EMS arrived. Patient febrile and confused. Recently diagnosed with UTI. Patient has no complaints.

## 2020-06-15 NOTE — ED Notes (Signed)
Entered room and introduced self to patient. Pt appears to be resting in bed, respirations are even and unlabored with equal chest rise and fall. Bed is locked in the lowest position, side rails x2, call bell within reach. Pt educated on call light use and hourly rounding. All questions and concerns voiced addressed. Refreshments offered and provided per patient request. Cardiac monitor in place with vital signs cycling q30 minutes. Will continue to monitor.

## 2020-06-16 ENCOUNTER — Inpatient Hospital Stay (HOSPITAL_COMMUNITY): Payer: Medicare PPO

## 2020-06-16 DIAGNOSIS — A419 Sepsis, unspecified organism: Principal | ICD-10-CM

## 2020-06-16 LAB — COMPREHENSIVE METABOLIC PANEL
ALT: 37 U/L (ref 0–44)
AST: 30 U/L (ref 15–41)
Albumin: 2.4 g/dL — ABNORMAL LOW (ref 3.5–5.0)
Alkaline Phosphatase: 68 U/L (ref 38–126)
Anion gap: 12 (ref 5–15)
BUN: 10 mg/dL (ref 8–23)
CO2: 26 mmol/L (ref 22–32)
Calcium: 8.2 mg/dL — ABNORMAL LOW (ref 8.9–10.3)
Chloride: 103 mmol/L (ref 98–111)
Creatinine, Ser: 0.51 mg/dL (ref 0.44–1.00)
GFR, Estimated: >60 mL/min (ref >60)
Glucose, Bld: 97 mg/dL (ref 70–99)
Potassium: 2.8 mmol/L — ABNORMAL LOW (ref 3.5–5.1)
Sodium: 141 mmol/L (ref 135–145)
Total Bilirubin: 0.7 mg/dL (ref 0.3–1.2)
Total Protein: 6.3 g/dL — ABNORMAL LOW (ref 6.5–8.1)

## 2020-06-16 LAB — STREP PNEUMONIAE URINARY ANTIGEN: Strep Pneumo Urinary Antigen: NEGATIVE

## 2020-06-16 LAB — CBC
HCT: 32.5 % — ABNORMAL LOW (ref 36.0–46.0)
Hemoglobin: 10.5 g/dL — ABNORMAL LOW (ref 12.0–15.0)
MCH: 30.4 pg (ref 26.0–34.0)
MCHC: 32.3 g/dL (ref 30.0–36.0)
MCV: 94.2 fL (ref 80.0–100.0)
Platelets: 136 10*3/uL — ABNORMAL LOW (ref 150–400)
RBC: 3.45 MIL/uL — ABNORMAL LOW (ref 3.87–5.11)
RDW: 17.2 % — ABNORMAL HIGH (ref 11.5–15.5)
WBC: 6.9 10*3/uL (ref 4.0–10.5)
nRBC: 0.4 % — ABNORMAL HIGH (ref 0.0–0.2)

## 2020-06-16 LAB — PHOSPHORUS: Phosphorus: 2.6 mg/dL (ref 2.5–4.6)

## 2020-06-16 LAB — APTT: aPTT: 39 seconds — ABNORMAL HIGH (ref 24–36)

## 2020-06-16 LAB — PROTIME-INR
INR: 1.3 — ABNORMAL HIGH (ref 0.8–1.2)
Prothrombin Time: 15.3 seconds — ABNORMAL HIGH (ref 11.4–15.2)

## 2020-06-16 LAB — MAGNESIUM: Magnesium: 1.7 mg/dL (ref 1.7–2.4)

## 2020-06-16 MED ORDER — CHLORHEXIDINE GLUCONATE CLOTH 2 % EX PADS
6.0000 | MEDICATED_PAD | Freq: Every day | CUTANEOUS | Status: DC
  Administered 2020-06-16 – 2020-06-21 (×5): 6 via TOPICAL

## 2020-06-16 MED ORDER — CLONIDINE HCL 0.2 MG PO TABS
0.3000 mg | ORAL_TABLET | Freq: Every day | ORAL | Status: DC
  Administered 2020-06-16 – 2020-06-20 (×5): 0.3 mg via ORAL
  Filled 2020-06-16 (×5): qty 1

## 2020-06-16 MED ORDER — HALOPERIDOL LACTATE 5 MG/ML IJ SOLN
2.0000 mg | Freq: Once | INTRAMUSCULAR | Status: AC
  Administered 2020-06-16: 2 mg via INTRAMUSCULAR
  Filled 2020-06-16: qty 1

## 2020-06-16 MED ORDER — POTASSIUM CHLORIDE CRYS ER 20 MEQ PO TBCR
40.0000 meq | EXTENDED_RELEASE_TABLET | ORAL | Status: AC
  Administered 2020-06-16: 40 meq via ORAL
  Filled 2020-06-16: qty 2

## 2020-06-16 MED ORDER — HYDRALAZINE HCL 20 MG/ML IJ SOLN
10.0000 mg | Freq: Four times a day (QID) | INTRAMUSCULAR | Status: DC | PRN
  Filled 2020-06-16: qty 1

## 2020-06-16 MED ORDER — ACETAMINOPHEN 325 MG PO TABS
650.0000 mg | ORAL_TABLET | Freq: Four times a day (QID) | ORAL | Status: DC | PRN
  Administered 2020-06-16 – 2020-06-20 (×4): 650 mg via ORAL
  Filled 2020-06-16 (×4): qty 2

## 2020-06-16 NOTE — Progress Notes (Signed)
   06/16/20 1400  Assess: MEWS Score  Temp 98.4 F (36.9 C)  BP (!) 163/95  Pulse Rate (!) 134  Resp 20  SpO2 98 %  O2 Device HFNC  O2 Flow Rate (L/min) 4 L/min  Assess: MEWS Score  MEWS Temp 0  MEWS Systolic 0  MEWS Pulse 3  MEWS RR 0  MEWS LOC 0  MEWS Score 3  MEWS Score Color Yellow  Assess: if the MEWS score is Yellow or Red  Were vital signs taken at a resting state? Yes  Focused Assessment No change from prior assessment  Early Detection of Sepsis Score *See Row Information* High  MEWS guidelines implemented *See Row Information* No, previously yellow, continue vital signs every 4 hours  Take Vital Signs  Increase Vital Sign Frequency  Yellow: Q 2hr X 2 then Q 4hr X 2, if remains yellow, continue Q 4hrs  Escalate  MEWS: Escalate Yellow: discuss with charge nurse/RN and consider discussing with provider and RRT  Notify: Charge Nurse/RN  Name of Charge Nurse/RN Notified Doctor, hospital (primary nurse is charge nurse)  Date Charge Nurse/RN Notified 06/16/20  Time Charge Nurse/RN Notified 1429

## 2020-06-16 NOTE — Progress Notes (Signed)
PROGRESS NOTE    Wanda Ross  SMO:[id number] DOB: [birth year] DOA: 06/15/2020 PCP: Kerri Perches, MD   Brief Narrative:  HPI: Wanda Ross is a 70 y.o. female with medical history significant for Glioblastoma ( followed by Dr. Barbaraann Cao), Seizures, Hypertension and hyperlipidemia who presents to the emergency department via EMS due to shortness of breath and increased confusion.  Patient was seen by physical therapy at home today and she was noted to be febrile with a temperature of 101F, she was also tachycardic (HR in 120s) and presented with increased shortness of breath, PulseOx was checked and was noted to be in low 80s, so EMS was activated and patient was sent to the ED for further evaluation and management. Patient was seen in the ED on 1/15 and she was diagnosed to have UTI, she was treated with IV ceftriaxone in the ED and discharged home with Augmentin. She was admitted on 12/25 and discharged on 05/25/2020 due to facial herpes zoster involving the right eye and was treated with IV acyclovir until 12/28, when she was transitioned to Valtrex 100 mg 3 times daily for additional 5 days to complete course of therapy. She lives at home alone, but her children were always visiting her at home.  ED Course:  In the emergency department, she was febrile and tachycardic.  O2 sat was 95% on supplemental oxygen via [place] at 6 LPM.  Work-up in the ED showed normocytic anemia, D-dimer 6.06, albumin 2.5, urinalysis showed proteinuria and rare bacteria.  Lactic acid 1.2.  Respiratory panel for influenza A, B and SARS coronavirus 2 was negative.  Urine culture collected on 06/12/2020 showed Escherichia Coli CT angiography chest with contrast was negative for acute pulmonary embolus or aortic dissection, but showed small right-sided pleural effusion and partial consolidation in the right greater than left lower lobes with additional small foci of consolidation and ground-glass density in the  lingula, possible bilateral pneumonia. Chest x-ray showed likely early infiltrate in the right base CTA head without contrast showed no evidence of acute infarction or acute intracranial hemorrhage She was treated with IV antibiotics, IV Ativan 1 mg x 1 was given, Tylenol was given and IV hydration was provided.  Hospitalist was asked to admit patient for further evaluation and management.  Assessment & Plan:   Principal Problem:   Sepsis (HCC) Active Problems:   Hyperlipidemia   Essential hypertension   Hypoalbuminemia due to protein-calorie malnutrition (HCC)   Glioblastoma (HCC)   Acute respiratory failure with hypoxia (HCC)   Seizures (HCC)   Post herpetic neuralgia   CAP (community acquired pneumonia)   Acute metabolic encephalopathy   Pleural effusion on right   Elevated d-dimer  Acute metabolic encephalopathy possibly secondary to acute respiratory failure with hypoxia due to severe sepsis sepsis secondary to presumed CAP, POA: Patient meets severe sepsis criteria based on fever, tachycardia, tachypnea and acute encephalopathy.   CT angiography of chest was suggestive of bilateral pneumonia, in lower lobes..  She is currently little more alert, she knows her name, date of birth and that she is in the hospital.  Missed the month and the year.  Her last fever was 101.3 at 2:45 AM on 06/16/2020.  As far as respiratory symptoms goes, she feels better.  Procalcitonin unremarkable.  Urine antigen for streptococci negative.  Legionella urine antigen pending.  COVID-negative.  Continue Rocephin and Zithromax and follow culture and tailor antibiotics currently.  Her confusion is out of proportion for her pneumonia.  If  she does not improve in next 24 hours, could consider MRI brain due to history of glioblastoma.  Right-sided pleural effusion CT angiography of chest showed right-sided pleural effusion: IR was consulted however she had an adequate amount for thoracentesis.  UTI  Patient  was recently diagnosed with a UTI and started on Augmentin Urine culture done on 1/15 was positive for E. Coli Urinalysis unimpressive for UTI at this time Any subsequent UTI will be covered by IV ceftriaxone as indicated above for CAP POA  Elevated D-dimer D-dimer 6.06; CT angiography of chest was negative for pulmonary embolism or aortic dissection  Essential hypertension: Blood pressure slightly elevated. Continue Norvasc, resume current regimen nightly and add as needed hydralazine.  Hyperlipidemia Continue pravastatin  History of glioblastoma Continue Decadron and Temodar  Seizures Continue Keppra  Hypoalbuminemia due to moderate protein calorie malnutrition Albumin 2.5, protein supplement reported  Postherpetic neuralgia Continue Neurontin   DVT prophylaxis: SCDs Start: 06/15/20 2043   Code Status: Full Code  Family Communication:  None present at bedside.   Status is: Inpatient  Remains inpatient appropriate because:Inpatient level of care appropriate due to severity of illness   Dispo: The patient is from: Home              Anticipated d/c is to: Home              Anticipated d/c date is: 2 days              Patient currently is not medically stable to d/c.        Estimated body mass index is 22.49 kg/m as calculated from the following:   Height as of this encounter: 5\' 8"  (1.727 m).   Weight as of this encounter: 67.1 kg.  Pressure Injury 05/14/20 Coccyx Medial Stage 3 -  Full thickness tissue loss. Subcutaneous fat may be visible but bone, tendon or muscle are NOT exposed. One larger stage 3 with smaller stage threes around. (Active)  05/14/20 0700  Location: Coccyx  Location Orientation: Medial  Staging: Stage 3 -  Full thickness tissue loss. Subcutaneous fat may be visible but bone, tendon or muscle are NOT exposed.  Wound Description (Comments): One larger stage 3 with smaller stage threes around.  Present on Admission: Yes      Nutritional status:               Consultants:   None  Procedures:   None  Antimicrobials:  Anti-infectives (From admission, onward)   Start     Dose/Rate Route Frequency Ordered Stop   06/15/20 2000  azithromycin (ZITHROMAX) 500 mg in sodium chloride 0.9 % 250 mL IVPB        500 mg 250 mL/hr over 60 Minutes Intravenous Every 24 hours 06/15/20 1949     06/15/20 1700  cefTRIAXone (ROCEPHIN) 1 g in sodium chloride 0.9 % 100 mL IVPB        1 g 200 mL/hr over 30 Minutes Intravenous Every 24 hours 06/15/20 1647           Subjective: Patient seen and examined.  She is partially confused, she was able to tell me her name and date of birth and that she is in the hospital but [facility] [place].  Could not tell me any other questions.  She was in mittens however she was trying to follow commands.  Moving all extremities spontaneously.  Objective: Vitals:   06/16/20 0600 06/16/20 0630 06/16/20  1000 06/16/20 1400  BP: (!) 158/102 (!) 163/93 (!) 161/77 (!) 163/95  Pulse:   (!) 136 (!) 134  Resp: 14 15 18 20   Temp: 99.9 F (37.7 C) 100 F (37.8 C) 98.7 F (37.1 C) 98.4 F (36.9 C)  TempSrc:    Oral  SpO2:   100% 98%  Weight:      Height:        Intake/Output Summary (Last 24 hours) at 06/16/2020 1444 Last data filed at 06/16/2020 1002 Gross per 24 hour  Intake 2350 ml  Output 4400 ml  Net -2050 ml   Filed Weights   06/15/20 1648  Weight: 67.1 kg    Examination:  General exam: Appears calm and comfortable  Respiratory system: Rhonchi bilaterally. Respiratory effort normal. Cardiovascular system: S1 & S2 heard, RRR. No JVD, murmurs, rubs, gallops or clicks. No pedal edema. Gastrointestinal system: Abdomen is nondistended, soft and nontender. No organomegaly or masses felt. Normal bowel sounds heard. Central nervous system: Alert and oriented x1. No focal neurological deficits. Extremities: Symmetric 5 x 5 power.  Data  Reviewed: I have personally reviewed following labs and imaging studies  CBC: Recent Labs  Lab 06/12/20 0945 06/15/20 1700 06/16/20 0348  WBC 12.7* 7.1 6.9  NEUTROABS 11.4* 5.9  --   HGB 10.6* 10.5* 10.5*  HCT 30.9* 33.0* 32.5*  MCV 93.6 96.5 94.2  PLT 123* 157 [id number]*   Basic Metabolic Panel: Recent Labs  Lab 06/12/20 0945 06/15/20 1700 06/16/20 0348  NA 136 144 141  K 3.6 4.0 2.8*  CL 100 108 103  CO2 25 27 26   GLUCOSE 127* 98 97  BUN 22 21 10   CREATININE 0.76 0.64 0.51  CALCIUM 8.2* 8.4* 8.2*  MG  --   --  1.7  PHOS  --   --  2.6   GFR: Estimated Creatinine Clearance: 65.1 mL/min (by C-G formula based on SCr of 0.51 mg/dL). Liver Function Tests: Recent Labs  Lab 06/12/20 0945 06/15/20 1700 06/16/20 0348  AST 20 28 30   ALT 33 37 37  ALKPHOS 63 69 68  BILITOT 1.1 0.7 0.7  PROT 6.3* 6.7 6.3*  ALBUMIN 2.5* 2.5* 2.4*   No results for input(s): LIPASE, AMYLASE in the last 168 hours. No results for input(s): AMMONIA in the last 168 hours. Coagulation Profile: Recent Labs  Lab 06/15/20 1817 06/16/20 0348  INR 1.2 1.3*   Cardiac Enzymes: No results for input(s): CKTOTAL, CKMB, CKMBINDEX, TROPONINI in the last 168 hours. BNP (last 3 results) No results for input(s): PROBNP in the last 8760 hours. HbA1C: No results for input(s): HGBA1C in the last 72 hours. CBG: No results for input(s): GLUCAP in the last 168 hours. Lipid Profile: No results for input(s): CHOL, HDL, LDLCALC, TRIG, CHOLHDL, LDLDIRECT in the last 72 hours. Thyroid Function Tests: No results for input(s): TSH, T4TOTAL, FREET4, T3FREE, THYROIDAB in the last 72 hours. Anemia Panel: No results for input(s): VITAMINB12, FOLATE, FERRITIN, TIBC, IRON, RETICCTPCT in the last 72 hours. Sepsis Labs: Recent Labs  Lab 06/15/20 1700 06/15/20 1817 06/15/20 1948  PROCALCITON  --  0.20  --   LATICACIDVEN 1.2  --  1.0    Recent Results (from the past 240 hour(s))  Resp Panel by RT-PCR (Flu A&B,  Covid) Nasopharyngeal Swab     Status: None   Collection Time: 06/12/20  9:38 AM   Specimen: Nasopharyngeal Swab; Nasopharyngeal(NP) swabs in vial transport medium  Result Value Ref Range Status   SARS Coronavirus 2  by RT PCR NEGATIVE NEGATIVE Final    Comment: (NOTE) SARS-CoV-2 target nucleic acids are NOT DETECTED.  The SARS-CoV-2 RNA is generally detectable in upper respiratory specimens during the acute phase of infection. The lowest concentration of SARS-CoV-2 viral copies this assay can detect is 138 copies/mL. A negative result does not preclude SARS-Cov-2 infection and should not be used as the sole basis for treatment or other patient management decisions. A negative result may occur with  improper specimen collection/handling, submission of specimen other than nasopharyngeal swab, presence of viral mutation(s) within the areas targeted by this assay, and inadequate number of viral copies(<138 copies/mL). A negative result must be combined with clinical observations, patient history, and epidemiological information. The expected result is Negative.  Fact Sheet for Patients:  EntrepreneurPulse.com.au  Fact Sheet for Healthcare Providers:  IncredibleEmployment.be  This test is no t yet approved or cleared by the [place] FDA and  has been authorized for detection and/or diagnosis of SARS-CoV-2 by FDA under an Emergency Use Authorization (EUA). This EUA will remain  in effect (meaning this test can be used) for the duration of the COVID-19 declaration under Section 564(b)(1) of the Act, 21 U.S.C.section 360bbb-3(b)(1), unless the authorization is terminated  or revoked sooner.       Influenza A by PCR NEGATIVE NEGATIVE Final   Influenza B by PCR NEGATIVE NEGATIVE Final    Comment: (NOTE) The Xpert Xpress SARS-CoV-2/FLU/RSV plus assay is intended as an aid in the diagnosis of influenza from Nasopharyngeal swab specimens and should  not be used as a sole basis for treatment. Nasal washings and aspirates are unacceptable for Xpert Xpress SARS-CoV-2/FLU/RSV testing.  Fact Sheet for Patients: EntrepreneurPulse.com.au  Fact Sheet for Healthcare Providers: IncredibleEmployment.be  This test is not yet approved or cleared by the [place] FDA and has been authorized for detection and/or diagnosis of SARS-CoV-2 by FDA under an Emergency Use Authorization (EUA). This EUA will remain in effect (meaning this test can be used) for the duration of the COVID-19 declaration under Section 564(b)(1) of the Act, 21 U.S.C. section 360bbb-3(b)(1), unless the authorization is terminated or revoked.  Performed at [facility], [address], [place], [place] [address]   Urine culture     Status: Abnormal   Collection Time: 06/12/20 11:43 AM   Specimen: Urine, Clean Catch  Result Value Ref Range Status   Specimen Description   Final    URINE, CLEAN CATCH Performed at [facility], [address], [place], [place] [address]    Special Requests   Final    NONE Performed at [facility], [address], [place], [place] [address]    Culture >=100,000 COLONIES/mL ESCHERICHIA COLI (A)  Final   Report Status 06/15/2020 FINAL  Final   Organism ID, Bacteria ESCHERICHIA COLI (A)  Final      Susceptibility   Escherichia coli - MIC*    AMPICILLIN >=32 RESISTANT Resistant     CEFAZOLIN <=4 SENSITIVE Sensitive     CEFEPIME <=0.12 SENSITIVE Sensitive     CEFTRIAXONE <=0.25 SENSITIVE Sensitive     CIPROFLOXACIN >=4 RESISTANT Resistant     GENTAMICIN <=1 SENSITIVE Sensitive     IMIPENEM <=0.25 SENSITIVE Sensitive     NITROFURANTOIN <=16 SENSITIVE Sensitive     TRIMETH/SULFA >=320 RESISTANT Resistant     AMPICILLIN/SULBACTAM 8 SENSITIVE Sensitive     PIP/TAZO <=4 SENSITIVE Sensitive     * >=100,000 COLONIES/mL ESCHERICHIA COLI  Blood Culture (routine x 2)  Status: None (Preliminary result)    Collection Time: 06/15/20  5:00 PM   Specimen: BLOOD  Result Value Ref Range Status   Specimen Description BLOOD LEFT ANTECUBITAL  Final   Special Requests   Final    BOTTLES DRAWN AEROBIC AND ANAEROBIC Blood Culture results may not be optimal due to an inadequate volume of blood received in culture bottles   Culture   Final    NO GROWTH < 24 HOURS Performed at [facility], [address], [place], [place] [address]    Report Status PENDING  Incomplete  Resp Panel by RT-PCR (Flu A&B, Covid) Nasopharyngeal Swab     Status: None   Collection Time: 06/15/20  5:20 PM   Specimen: Nasopharyngeal Swab; Nasopharyngeal(NP) swabs in vial transport medium  Result Value Ref Range Status   SARS Coronavirus 2 by RT PCR NEGATIVE NEGATIVE Final    Comment: (NOTE) SARS-CoV-2 target nucleic acids are NOT DETECTED.  The SARS-CoV-2 RNA is generally detectable in upper respiratory specimens during the acute phase of infection. The lowest concentration of SARS-CoV-2 viral copies this assay can detect is 138 copies/mL. A negative result does not preclude SARS-Cov-2 infection and should not be used as the sole basis for treatment or other patient management decisions. A negative result may occur with  improper specimen collection/handling, submission of specimen other than nasopharyngeal swab, presence of viral mutation(s) within the areas targeted by this assay, and inadequate number of viral copies(<138 copies/mL). A negative result must be combined with clinical observations, patient history, and epidemiological information. The expected result is Negative.  Fact Sheet for Patients:  EntrepreneurPulse.com.au  Fact Sheet for Healthcare Providers:  IncredibleEmployment.be  This test is no t yet approved or cleared by the [place] FDA and  has been authorized for detection and/or diagnosis of SARS-CoV-2 by FDA under an Emergency Use Authorization (EUA). This  EUA will remain  in effect (meaning this test can be used) for the duration of the COVID-19 declaration under Section 564(b)(1) of the Act, 21 U.S.C.section 360bbb-3(b)(1), unless the authorization is terminated  or revoked sooner.       Influenza A by PCR NEGATIVE NEGATIVE Final   Influenza B by PCR NEGATIVE NEGATIVE Final    Comment: (NOTE) The Xpert Xpress SARS-CoV-2/FLU/RSV plus assay is intended as an aid in the diagnosis of influenza from Nasopharyngeal swab specimens and should not be used as a sole basis for treatment. Nasal washings and aspirates are unacceptable for Xpert Xpress SARS-CoV-2/FLU/RSV testing.  Fact Sheet for Patients: EntrepreneurPulse.com.au  Fact Sheet for Healthcare Providers: IncredibleEmployment.be  This test is not yet approved or cleared by the [place] FDA and has been authorized for detection and/or diagnosis of SARS-CoV-2 by FDA under an Emergency Use Authorization (EUA). This EUA will remain in effect (meaning this test can be used) for the duration of the COVID-19 declaration under Section 564(b)(1) of the Act, 21 U.S.C. section 360bbb-3(b)(1), unless the authorization is terminated or revoked.  Performed at [facility], [address], [place], [place] [address]   Blood Culture (routine x 2)     Status: None (Preliminary result)   Collection Time: 06/15/20  6:17 PM   Specimen: Right Antecubital; Blood  Result Value Ref Range Status   Specimen Description RIGHT ANTECUBITAL  Final   Special Requests   Final    BOTTLES DRAWN AEROBIC AND ANAEROBIC Blood Culture adequate volume   Culture   Final    NO GROWTH < 24 HOURS Performed at [facility]  [facility], [address], [place], [place] [address]    Report Status PENDING  Incomplete      Radiology Studies: CT Head Wo Contrast  Result Date: 06/15/2020 CLINICAL DATA:  Delirium. Additional history provided: Patient with shortness of breath, febrile,  confusion, recently diagnosed with UTI. Additional history obtained from [place] NUMBERHistory of glioblastoma multiform a and seizures. EXAM: CT HEAD WITHOUT CONTRAST TECHNIQUE: Contiguous axial images were obtained from the base of the skull through the vertex without intravenous contrast. COMPARISON:  Prior brain MRI examinations 05/14/2020 and earlier. Prior head CT examinations 05/14/2020 and earlier. FINDINGS: Brain: Redemonstrated resection cavity within the left parietal lobe and callosal splenium. As before, there is white matter hypoattenuation surrounding the resection cavity, within the callosal splenium and within the posterior cerebral hemispheres bilaterally. This corresponds with the T2/FLAIR hyperintense signal abnormality on the prior brain MRI of 05/14/2020. There is a subtly hyperdense 10 mm nodular focus just to the right of midline along the callosal splenium. This corresponds with the enhancing nodule demonstrated on the prior MRI. Mild ill-defined hypoattenuation elsewhere within the cerebral white matter is nonspecific, but compatible with chronic small vessel ischemic disease. There is no evidence of acute intracranial hemorrhage. No acute demarcated cortical infarct. No extra-axial fluid collection. No midline shift. Vascular: No hyperdense vessel. Skull: Prior left parietal craniotomy/cranioplasty. No calvarial fracture. Sinuses/Orbits: Visualized orbits show no acute finding. Air-fluid level within the right sphenoid sinus. Mild bilateral ethmoid and left sphenoid sinus mucosal thickening. Other: Right mastoid effusion. IMPRESSION: No evidence of acute infarction or acute intracranial hemorrhage. Redemonstrated surgical cavity within the left parietal lobe and callosal splenium at site of prior brain tumor resection. There is nonspecific matter hypoattenuation surrounding the resection cavity, within the callosal splenium and within the posterior cerebral hemispheres  bilaterally. This corresponds with the T2/FLAIR hyperintense signal abnormality at these sites on the prior brain MRI of 05/14/2020. A 10 mm subtly hyperdense focus is present just to the right of midline along the callosal splenium. This likely reflects the nodular enhancing focus demonstrated on the prior MRI and this is suspicious for residual viable tumor. Stable background mild cerebral white matter chronic small vessel ischemic disease. Paranasal sinus disease as described. Correlate for acute sinusitis. Right mastoid effusion. Electronically Signed   By: Kellie Simmering DO   On: 06/15/2020 19:55   CT Angio Chest PE W/Cm &/Or Wo Cm  Result Date: 06/15/2020 CLINICAL DATA:  Shortness of breath and febrile EXAM: CT ANGIOGRAPHY CHEST WITH CONTRAST TECHNIQUE: Multidetector CT imaging of the chest was performed using the standard protocol during bolus administration of intravenous contrast. Multiplanar CT image reconstructions and MIPs were obtained to evaluate the vascular anatomy. CONTRAST:  184mL OMNIPAQUE IOHEXOL 350 MG/ML SOLN COMPARISON:  Chest x-ray 06/15/2020, CT chest 11/23/2019 FINDINGS: Cardiovascular: Satisfactory opacification of the pulmonary arteries to the segmental level. No evidence of pulmonary embolism. Nonaneurysmal aorta. No dissection is seen. Normal cardiac size. No significant pericardial effusion. Mediastinum/Nodes: 1.1 cm hypodense nodule in the right lobe of the thyroid, no further workup recommended based on size of lesion and age of patient. Midline trachea. No suspicious adenopathy. Esophagus within normal limits Lungs/Pleura: Small right-sided pleural effusion. Right medial apical ovoid hypodensity measuring 2.4 cm is of fluid density and may represent a small amount of loculated fluid at the right apex. Hazy bilateral lung densities. Partial consolidation in the right greater than left lower lobes. Small foci of consolidation and ground-glass density in the lingula. No pneumothorax.  Upper Abdomen: Stable small hyperenhancing focus in the left hepatic lobe. No acute abnormality. Musculoskeletal: Degenerative changes of the spine. No acute or suspicious osseous abnormality. Review of the MIP images confirms the above findings. IMPRESSION: 1. Negative for acute pulmonary embolus or aortic dissection. 2. Small right-sided pleural effusion. Partial consolidation in the right greater than left lower lobes with additional small foci of consolidation and ground-glass density in the lingula, possible bilateral pneumonia. 3. 2.4 cm right medial apical hypodensity of fluid attenuation, possibly representing small loculated apical fluid. Attention on follow-up imaging. Electronically Signed   By: Donavan Foil M.D.   On: 06/15/2020 20:00   [place] CHEST (PLEURAL EFFUSION)  Result Date: 06/16/2020 CLINICAL DATA:  RIGHT pleural effusion, question sufficient for thoracentesis EXAM: CHEST ULTRASOUND COMPARISON:  CT chest 06/15/2020 FINDINGS: Minimal RIGHT pleural effusion is identified. Amount of pleural fluid is insufficient for thoracentesis. IMPRESSION: Insufficient RIGHT pleural effusion for thoracentesis. Electronically Signed   By: Lavonia Dana M.D.   On: 06/16/2020 10:48   DG Chest Port 1 View  Result Date: 06/15/2020 CLINICAL DATA:  Shortness of breath and possible sepsis EXAM: PORTABLE CHEST 1 VIEW COMPARISON:  06/12/2020 FINDINGS: Cardiac shadow is stable. The lungs are well aerated bilaterally. New right basilar airspace opacity is noted consistent with atelectasis or early infiltrate. No other focal airspace opacity is noted. No bony abnormality is seen. IMPRESSION: Likely early infiltrate in the right base. Electronically Signed   By: Inez Catalina M.D.   On: 06/15/2020 18:49    Scheduled Meds: . amLODipine  5 mg Oral Daily  . Chlorhexidine Gluconate Cloth  6 each Topical Daily  . dexamethasone  1 mg Oral Q breakfast  . feeding supplement  237 mL Oral BID BM  . gabapentin  600 mg Oral TID   . levETIRAcetam  1,000 mg Oral BID  . potassium chloride  40 mEq Oral Q4H  . pravastatin  10 mg Oral QHS   Continuous Infusions: . azithromycin Stopped (06/15/20 2109)  . cefTRIAXone (ROCEPHIN)  IV Stopped (06/15/20 1808)     LOS: 1 day   Time spent: 35 minutes   Darliss Cheney, MD [facility]  06/16/2020, 2:44 PM   To contact the attending provider between 7A-7P or the covering provider during after hours 7P-7A, please log into the web site [email or web address].

## 2020-06-16 NOTE — ED Notes (Signed)
Patient self removed hand IV.  Attempted to insert 2nd line x2 with no success.

## 2020-06-16 NOTE — Progress Notes (Signed)
Patient's IV infiltrated and has had several unsuccessful attempts, including with [place]. Dr. Doristine Bosworth made aware. Patient unable to get PRN hydralazine and IV Rocephin. Oncoming shift to be made aware.

## 2020-06-16 NOTE — ED Notes (Signed)
Report called to [facility]

## 2020-06-16 NOTE — ED Notes (Signed)
Pt repositioned leads put back on pt and placed back in bed.  Pt alert to voice, very confused.  Mittens in place and ROM performed good PMS

## 2020-06-16 NOTE — TOC Initial Note (Signed)
Transition of Care [facility]) - Initial/Assessment Note   Patient Details  Name: Wanda Ross MRN: [id number] Date of Birth: 1951/04/27  Transition of Care [facility]) CM/SW Contact:    Sherie Don, LCSW Phone Number: 06/16/2020, 10:18 AM  Clinical Narrative: Patient is a 70 year old female who was admitted for sepsis. Patient has a history of hyperlipidemia, hypertension, hypoalbuminemia due to protein-calorie malnutrition, glioblastoma, acute respiratory failure with hypoxia, and seizures. Readmission checklist completed due to high readmission score.          CSW spoke with patient's daughter, Wanda Ross, and sister, Wanda Ross, to complete assessment as patient is disoriented x4. Per daughter, patient is supervised by her siblings during the day and her children stay with her at night so she has 24/7 supervision. Current DME includes a shower chair. Patient is currently active with Alvis Lemmings for [facility] and [facility] for OP palliative care. Daughter expressed concerns that patient may need more help than what HH can provide due to recent changes. Daughter reported patient has no issues with affording medications and taking them as prescribed. Children currently provide transportation to appointments.  CSW asked about ADLs. Daughter reported that patient was mostly independent with ADLs until she had a seizure 05/14/20. A few days after the seizure, patient developed shingles and when the shingles cause the patient a significant amount of pain, she is too exhausted to complete ADLs. Daughter stated "her behavior has changed some" since the seizure and the shingles. TOC to follow for discharge needs.  Expected Discharge Plan: [place] Barriers to Discharge: Continued Medical Work up  Patient Goals and CMS Choice CMS Medicare.gov Compare Post Acute Care list provided to:: Patient Represenative (must comment) ([name] Jons (daughter)) Choice offered to / list presented to : Adult  Children  Expected Discharge Plan and Services Expected Discharge Plan: [place] In-house Referral: Clinical Social Work Discharge Planning Services: NA Post Acute Care Choice: [place] arrangements for the past 2 months: Single Family Home             DME Arranged: N/A DME Agency: NA HH Arranged: RN,PT,Nurse's Aide Early Agency: [place]  Prior Living Arrangements/Services Living arrangements for the past 2 months: Single Family Home Lives with:: Self,Adult Children,Siblings (Adult children stay with her at night and siblings during the day to provide 24/7 supervision in the home.) Patient language and need for interpreter reviewed:: Yes Need for Family Participation in Patient Care: Yes (Comment) (Patient currently disoriented x4) Care giver support system in place?: Yes (comment) Current home services: DME Sales executive chair) Criminal Activity/Legal Involvement Pertinent to Current Situation/Hospitalization: No - Comment as needed  Activities of Daily Living Home Assistive Devices/Equipment: Environmental consultant (specify type) ADL Screening (condition at time of admission) Patient's cognitive ability adequate to safely complete daily activities?: Yes Is the patient deaf or have difficulty hearing?: No Does the patient have difficulty seeing, even when wearing glasses/contacts?: No Does the patient have difficulty concentrating, remembering, or making decisions?: Yes Patient able to express need for assistance with ADLs?: Yes Does the patient have difficulty dressing or bathing?: Yes Independently performs ADLs?: No Communication: Independent Dressing (OT): Needs assistance Is this a change from baseline?: Pre-admission baseline Grooming: Needs assistance Is this a change from baseline?: Pre-admission baseline Feeding: Independent Bathing: Needs assistance Is this a change from baseline?: Pre-admission baseline Toileting: Needs assistance Is this a  change from baseline?: Pre-admission baseline In/Out Bed: Needs assistance Is this a change  from baseline?: Pre-admission baseline Walks in Home: Needs assistance Is this a change from baseline?: Pre-admission baseline Does the patient have difficulty walking or climbing stairs?: Yes Weakness of Legs: Both Weakness of Arms/Hands: None  Emotional Assessment Appearance:: Appears stated age Attitude/Demeanor/Rapport: Unable to Assess Affect (typically observed): Unable to Assess Orientation: : Fluctuating Orientation (Suspected and/or reported Sundowners) Alcohol / Substance Use: Not Applicable Psych Involvement: No (comment)  Admission diagnosis:  Pneumonia [J18.9] Glioblastoma ([place]) [C71.9] Acute respiratory failure with hypoxia ([place]) [J96.01] Sepsis ([place]) [A41.9] Altered mental status, unspecified altered mental status type [R41.82] Community acquired pneumonia, bilateral [J18.9] Sepsis with acute hypoxic respiratory failure without septic shock, due to unspecified organism ([place]) [A41.9, R65.20, J96.01] Patient Active Problem List   Diagnosis Date Noted  . Post herpetic neuralgia 06/15/2020  . Sepsis ([place]) 06/15/2020  . CAP (community acquired pneumonia) 06/15/2020  . Acute metabolic encephalopathy 09/38/1829  . Pleural effusion on right 06/15/2020  . Elevated d-dimer 06/15/2020  . Zoster with other complications 93/71/6967  . Herpes zoster 05/22/2020  . Acute respiratory failure with hypoxia ([place]) 05/14/2020  . Seizures ([place]) 05/14/2020  . Thrombocytopenia ([place]) 05/14/2020  . Lactic acidosis 05/14/2020  . Hypokalemia 05/14/2020  . Hyperglycemia 05/14/2020  . Pressure injury of skin 05/14/2020  . Goals of care, counseling/discussion 12/15/2019  . Glioblastoma ([place])   . Hypoalbuminemia due to protein-calorie malnutrition ([place])   . Transaminitis   . Benign essential HTN   . Prediabetes   . Seizure prophylaxis   . Glioma ([place]) 12/02/2019  . Right sided weakness   .  Dyslipidemia   . Essential hypertension   . Steroid-induced hyperglycemia   . Leucocytosis   . Brain mass 11/22/2019  . 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 Abnormal electrocardiogram (ECG) (EKG) 12/31/2017  . White coat syndrome with diagnosis of hypertension 12/31/2017  . Glaucoma 12/26/2013  . Vitamin D deficiency 05/27/2009  . IMPAIRED GLUCOSE TOLERANCE 05/27/2009  . OSTEOPENIA 05/27/2009  . Multinodular goiter 2010  . Multiple thyroid nodules 11/06/2007  . Hyperlipidemia 11/06/2007  . 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 [place] [address] Phone: [phone] Fax: [place], [place] [address] Phone: [phone] Fax: [phone]  Readmission Risk Interventions Readmission Risk Prevention Plan 06/16/2020 05/24/2020  Transportation Screening Complete Complete  PCP or Specialist Appt within 3-5 Days - Complete  HRI or [place] - Complete  Social Work Consult for [place] Planning/Counseling - Complete  Palliative Care Screening - Complete  Medication Review Press photographer) Complete Complete  HRI or Home Care Consult Complete -  SW Recovery Care/Counseling Consult Complete -  Palliative Care Screening Not Applicable -  Some recent data might be hidden

## 2020-06-17 ENCOUNTER — Other Ambulatory Visit: Payer: Self-pay | Admitting: Nurse Practitioner

## 2020-06-17 LAB — CBC WITH DIFFERENTIAL/PLATELET
Abs Immature Granulocytes: 0.18 10*3/uL — ABNORMAL HIGH (ref 0.00–0.07)
Basophils Absolute: 0 10*3/uL (ref 0.0–0.1)
Basophils Relative: 0 %
Eosinophils Absolute: 0 10*3/uL (ref 0.0–0.5)
Eosinophils Relative: 1 %
HCT: 27.7 % — ABNORMAL LOW (ref 36.0–46.0)
Hemoglobin: 9.1 g/dL — ABNORMAL LOW (ref 12.0–15.0)
Immature Granulocytes: 3 %
Lymphocytes Relative: 7 %
Lymphs Abs: 0.4 10*3/uL — ABNORMAL LOW (ref 0.7–4.0)
MCH: 31.3 pg (ref 26.0–34.0)
MCHC: 32.9 g/dL (ref 30.0–36.0)
MCV: 95.2 fL (ref 80.0–100.0)
Monocytes Absolute: 0.2 10*3/uL (ref 0.1–1.0)
Monocytes Relative: 3 %
Neutro Abs: 5.3 10*3/uL (ref 1.7–7.7)
Neutrophils Relative %: 86 %
Platelets: 133 10*3/uL — ABNORMAL LOW (ref 150–400)
RBC: 2.91 MIL/uL — ABNORMAL LOW (ref 3.87–5.11)
RDW: 17.3 % — ABNORMAL HIGH (ref 11.5–15.5)
WBC: 6.1 10*3/uL (ref 4.0–10.5)
nRBC: 0.3 % — ABNORMAL HIGH (ref 0.0–0.2)

## 2020-06-17 LAB — BASIC METABOLIC PANEL
Anion gap: 9 (ref 5–15)
BUN: 12 mg/dL (ref 8–23)
CO2: 28 mmol/L (ref 22–32)
Calcium: 8.1 mg/dL — ABNORMAL LOW (ref 8.9–10.3)
Chloride: 102 mmol/L (ref 98–111)
Creatinine, Ser: 0.56 mg/dL (ref 0.44–1.00)
GFR, Estimated: >60 mL/min (ref >60)
Glucose, Bld: 94 mg/dL (ref 70–99)
Potassium: 3.4 mmol/L — ABNORMAL LOW (ref 3.5–5.1)
Sodium: 139 mmol/L (ref 135–145)

## 2020-06-17 LAB — GLUCOSE, CAPILLARY
Glucose-Capillary: 77 mg/dL (ref 70–99)
Glucose-Capillary: 151 mg/dL — ABNORMAL HIGH (ref 70–99)

## 2020-06-17 LAB — LEGIONELLA PNEUMOPHILA SEROGP 1 UR AG: L. pneumophila Serogp 1 Ur Ag: NEGATIVE

## 2020-06-17 MED ORDER — DIPHENHYDRAMINE-ZINC ACETATE 2-0.1 % EX CREA
TOPICAL_CREAM | Freq: Three times a day (TID) | CUTANEOUS | Status: DC | PRN
  Filled 2020-06-17: qty 28

## 2020-06-17 MED ORDER — METOPROLOL TARTRATE 25 MG PO TABS
12.5000 mg | ORAL_TABLET | Freq: Two times a day (BID) | ORAL | Status: DC
  Administered 2020-06-17 – 2020-06-20 (×8): 12.5 mg via ORAL
  Filled 2020-06-17 (×9): qty 1

## 2020-06-17 MED ORDER — POTASSIUM CHLORIDE CRYS ER 20 MEQ PO TBCR
40.0000 meq | EXTENDED_RELEASE_TABLET | Freq: Once | ORAL | Status: AC
  Administered 2020-06-17: 40 meq via ORAL
  Filled 2020-06-17: qty 2

## 2020-06-17 NOTE — Progress Notes (Signed)
PROGRESS NOTE    Wanda Ross  EPP:[id number] DOB: 1950/12/07 DOA: 06/15/2020 PCP: Fayrene Helper, MD   Brief Narrative:  HPI: Wanda Ross is a 70 y.o. female with medical history significant for Glioblastoma ( followed by Dr. Mickeal Skinner), Seizures, Hypertension and hyperlipidemia who presents to the emergency department via EMS due to shortness of breath and increased confusion.  Patient was seen by physical therapy at home today and she was noted to be febrile with a temperature of 101F, she was also tachycardic (HR in 120s) and presented with increased shortness of breath, PulseOx was checked and was noted to be in low 80s, so EMS was activated and patient was sent to the ED for further evaluation and management. Patient was seen in the ED on 1/15 and she was diagnosed to have UTI, she was treated with IV ceftriaxone in the ED and discharged home with Augmentin. She was admitted on 12/25 and discharged on 05/25/2020 due to facial herpes zoster involving the right eye and was treated with IV acyclovir until 12/28, when she was transitioned to Valtrex 100 mg 3 times daily for additional 5 days to complete course of therapy. She lives at home alone, but her children were always visiting her at home.  ED Course:  In the emergency department, she was febrile and tachycardic.  O2 sat was 95% on supplemental oxygen via  at 6 LPM.  Work-up in the ED showed normocytic anemia, D-dimer 6.06, albumin 2.5, urinalysis showed proteinuria and rare bacteria.  Lactic acid 1.2.  Respiratory panel for influenza A, B and SARS coronavirus 2 was negative.  Urine culture collected on 06/12/2020 showed Escherichia Coli CT angiography chest with contrast was negative for acute pulmonary embolus or aortic dissection, but showed small right-sided pleural effusion and partial consolidation in the right greater than left lower lobes with additional small foci of consolidation and ground-glass density in the  lingula, possible bilateral pneumonia. Chest x-ray showed likely early infiltrate in the right base CTA head without contrast showed no evidence of acute infarction or acute intracranial hemorrhage She was treated with IV antibiotics, IV Ativan 1 mg x 1 was given, Tylenol was given and IV hydration was provided.  Hospitalist was asked to admit patient for further evaluation and management.  Assessment & Plan:   Principal Problem:   Sepsis ([place]) Active Problems:   Hyperlipidemia   Essential hypertension   Hypoalbuminemia due to protein-calorie malnutrition ([place])   Glioblastoma ([place])   Acute respiratory failure with hypoxia (H[facility])   Seizures (H[facility])   Post herpetic neuralgia   CAP (community acquired pneumonia)   Acute metabolic encephalopathy   Pleural effusion on right   Elevated d-dimer  Acute metabolic encephalopathy possibly secondary to acute respiratory failure with hypoxia due to severe sepsis sepsis secondary to presumed CAP, POA: Patient meets severe sepsis criteria based on fever, tachycardia, tachypnea and acute encephalopathy.   CT angiography of chest was suggestive of bilateral pneumonia, in lower lobes.  She is more alert and oriented today compared to yesterday.  She is able to answer all the questions about her name, date of birth, the fact that she is currently at [place] and current month but she missed current year.  She kept her eyes closed during the whole conversation but following commands appropriately.  Denied any shortness of breath.  Procalcitonin unremarkable.  Urine antigen for streptococci negative.  Legionella urine antigen pending.  COVID-negative.  Continue Rocephin and Zithromax and follow culture and tailor  antibiotics currently.   Right-sided pleural effusion CT angiography of chest showed right-sided pleural effusion: IR was consulted however she had an adequate amount for thoracentesis.  UTI  Patient was recently diagnosed with a UTI and started  on Augmentin Urine culture done on 1/15 was positive for E. Coli Urinalysis unimpressive for UTI at this time Any subsequent UTI will be covered by IV ceftriaxone as indicated above for CAP POA  Elevated D-dimer D-dimer 6.06; CT angiography of chest was negative for pulmonary embolism or aortic dissection  Essential hypertension: Blood pressure controlled.  Continue current regimen.  Hyperlipidemia Continue pravastatin  History of glioblastoma Continue Decadron and Temodar  Seizures Continue Keppra  Hypoalbuminemia due to moderate protein calorie malnutrition Albumin 2.5, protein supplement reported  Postherpetic neuralgia Continue Neurontin  Generalized weakness: Consult PT OT.  Will likely require discharge to SNF.   DVT prophylaxis: SCDs Start: 06/15/20 2043   Code Status: Full Code  Family Communication:  None present at bedside.  Called and updated/discussed with daughter Wanda Ross.  She also confirmed that her mother is more alert today.  Status is: Inpatient  Remains inpatient appropriate because:Inpatient level of care appropriate due to severity of illness   Dispo: The patient is from: Home              Anticipated d/c is to: Home              Anticipated d/c date is: 2 days              Patient currently is not medically stable to d/c.        Estimated body mass index is 22.49 kg/m as calculated from the following:   Height as of this encounter: 5\' 8"  (1.727 m).   Weight as of this encounter: 67.1 kg.  Pressure Injury 05/14/20 Coccyx Medial Stage 3 -  Full thickness tissue loss. Subcutaneous fat may be visible but bone, tendon or muscle are NOT exposed. One larger stage 3 with smaller stage threes around. (Active)  05/14/20 0700  Location: Coccyx  Location Orientation: Medial  Staging: Stage 3 -  Full thickness tissue loss. Subcutaneous fat may be visible but bone, tendon or muscle are NOT exposed.  Wound Description (Comments): One larger  stage 3 with smaller stage threes around.  Present on Admission: Yes     Nutritional status:               Consultants:   None  Procedures:   None  Antimicrobials:  Anti-infectives (From admission, onward)   Start     Dose/Rate Route Frequency Ordered Stop   06/15/20 2000  azithromycin (ZITHROMAX) 500 mg in sodium chloride 0.9 % 250 mL IVPB        500 mg 250 mL/hr over 60 Minutes Intravenous Every 24 hours 06/15/20 1949     06/15/20 1700  cefTRIAXone (ROCEPHIN) 1 g in sodium chloride 0.9 % 100 mL IVPB        1 g 200 mL/hr over 30 Minutes Intravenous Every 24 hours 06/15/20 1647           Subjective: Patient seen and examined this morning.  She was more alert and oriented than yesterday.  No complaints.  Objective: Vitals:   06/16/20 1900 06/16/20 2108 06/17/20 0307 06/17/20 0948  BP: (!) 152/83 138/84 114/67 140/74  Pulse: (!) 129 (!) 123 (!) 102 (!) 115  Resp: 20 18 18 18   Temp:  98.8 F (37.1 C) 99.1 F (37.3 C)  99.4 F (37.4 C)  TempSrc:  Oral Oral Oral  SpO2: 99% 100% 100% 95%  Weight:      Height:        Intake/Output Summary (Last 24 hours) at 06/17/2020 1315 Last data filed at 06/17/2020 0800 Gross per 24 hour  Intake 750.03 ml  Output 650 ml  Net 100.03 ml   Filed Weights   06/15/20 1648  Weight: 67.1 kg    Examination:  General exam: Appears calm and comfortable, keeps her eyes closed during the whole conversation although oriented x3 Respiratory system: Rhonchi bilaterally. Respiratory effort normal. Cardiovascular system: S1 & S2 heard, RRR. No JVD, murmurs, rubs, gallops or clicks. No pedal edema. Gastrointestinal system: Abdomen is nondistended, soft and nontender. No organomegaly or masses felt. Normal bowel sounds heard. Central nervous system: Alert and oriented x3. No focal neurological deficits. Extremities: Symmetric 5 x 5 power. Skin: Scabs at the dermatome involving right anterior chest, right anterior, top and posterior  shoulder due to recent herpes zoster infection.   Data Reviewed: I have personally reviewed following labs and imaging studies  CBC: Recent Labs  Lab 06/12/20 0945 06/15/20 1700 06/16/20 0348 06/17/20 0627  WBC 12.7* 7.1 6.9 6.1  NEUTROABS 11.4* 5.9  --  5.3  HGB 10.6* 10.5* 10.5* 9.1*  HCT 30.9* 33.0* 32.5* 27.7*  MCV 93.6 96.5 94.2 95.2  PLT 123* 157 136* [id number]*   Basic Metabolic Panel: Recent Labs  Lab 06/12/20 0945 06/15/20 1700 06/16/20 0348 06/17/20 0627  NA 136 144 141 139  K 3.6 4.0 2.8* 3.4*  CL 100 108 103 102  CO2 25 27 26 28   GLUCOSE 127* 98 97 94  BUN 22 21 10 12   CREATININE 0.76 0.64 0.51 0.56  CALCIUM 8.2* 8.4* 8.2* 8.1*  MG  --   --  1.7  --   PHOS  --   --  2.6  --    GFR: Estimated Creatinine Clearance: 65.1 mL/min (by C-G formula based on SCr of 0.56 mg/dL). Liver Function Tests: Recent Labs  Lab 06/12/20 0945 06/15/20 1700 06/16/20 0348  AST 20 28 30   ALT 33 37 37  ALKPHOS 63 69 68  BILITOT 1.1 0.7 0.7  PROT 6.3* 6.7 6.3*  ALBUMIN 2.5* 2.5* 2.4*   No results for input(s): LIPASE, AMYLASE in the last 168 hours. No results for input(s): AMMONIA in the last 168 hours. Coagulation Profile: Recent Labs  Lab 06/15/20 1817 06/16/20 0348  INR 1.2 1.3*   Cardiac Enzymes: No results for input(s): CKTOTAL, CKMB, CKMBINDEX, TROPONINI in the last 168 hours. BNP (last 3 results) No results for input(s): PROBNP in the last 8760 hours. HbA1C: No results for input(s): HGBA1C in the last 72 hours. CBG: Recent Labs  Lab 06/17/20 0747 06/17/20 1104  GLUCAP 77 151*   Lipid Profile: No results for input(s): CHOL, HDL, LDLCALC, TRIG, CHOLHDL, LDLDIRECT in the last 72 hours. Thyroid Function Tests: No results for input(s): TSH, T4TOTAL, FREET4, T3FREE, THYROIDAB in the last 72 hours. Anemia Panel: No results for input(s): VITAMINB12, FOLATE, FERRITIN, TIBC, IRON, RETICCTPCT in the last 72 hours. Sepsis Labs: Recent Labs  Lab 06/15/20 1700  06/15/20 1817 06/15/20 1948  PROCALCITON  --  0.20  --   LATICACIDVEN 1.2  --  1.0    Recent Results (from the past 240 hour(s))  Resp Panel by RT-PCR (Flu A&B, Covid) Nasopharyngeal Swab     Status: None   Collection Time: 06/12/20  9:38 AM   Specimen:  Nasopharyngeal Swab; Nasopharyngeal(NP) swabs in vial transport medium  Result Value Ref Range Status   SARS Coronavirus 2 by RT PCR NEGATIVE NEGATIVE Final    Comment: (NOTE) SARS-CoV-2 target nucleic acids are NOT DETECTED.  The SARS-CoV-2 RNA is generally detectable in upper respiratory specimens during the acute phase of infection. The lowest concentration of SARS-CoV-2 viral copies this assay can detect is 138 copies/mL. A negative result does not preclude SARS-Cov-2 infection and should not be used as the sole basis for treatment or other patient management decisions. A negative result may occur with  improper specimen collection/handling, submission of specimen other than nasopharyngeal swab, presence of viral mutation(s) within the areas targeted by this assay, and inadequate number of viral copies(<138 copies/mL). A negative result must be combined with clinical observations, patient history, and epidemiological information. The expected result is Negative.  Fact Sheet for Patients:  EntrepreneurPulse.com.au  Fact Sheet for Healthcare Providers:  IncredibleEmployment.be  This test is no t yet approved or cleared by the [place] FDA and  has been authorized for detection and/or diagnosis of SARS-CoV-2 by FDA under an Emergency Use Authorization (EUA). This EUA will remain  in effect (meaning this test can be used) for the duration of the COVID-19 declaration under Section 564(b)(1) of the Act, 21 U.S.C.section 360bbb-3(b)(1), unless the authorization is terminated  or revoked sooner.       Influenza A by PCR NEGATIVE NEGATIVE Final   Influenza B by PCR NEGATIVE NEGATIVE  Final    Comment: (NOTE) The Xpert Xpress SARS-CoV-2/FLU/RSV plus assay is intended as an aid in the diagnosis of influenza from Nasopharyngeal swab specimens and should not be used as a sole basis for treatment. Nasal washings and aspirates are unacceptable for Xpert Xpress SARS-CoV-2/FLU/RSV testing.  Fact Sheet for Patients: EntrepreneurPulse.com.au  Fact Sheet for Healthcare Providers: IncredibleEmployment.be  This test is not yet approved or cleared by the [place] FDA and has been authorized for detection and/or diagnosis of SARS-CoV-2 by FDA under an Emergency Use Authorization (EUA). This EUA will remain in effect (meaning this test can be used) for the duration of the COVID-19 declaration under Section 564(b)(1) of the Act, 21 U.S.C. section 360bbb-3(b)(1), unless the authorization is terminated or revoked.  Performed at [name], [address], [place], [place] [address]   Urine culture     Status: Abnormal   Collection Time: 06/12/20 11:43 AM   Specimen: Urine, Clean Catch  Result Value Ref Range Status   Specimen Description   Final    URINE, CLEAN CATCH Performed at [facility], [address] [place], [place] [address]    Special Requests   Final    NONE Performed at [facility], [address] [place], [place] [address]    Culture >=100,000 COLONIES/mL ESCHERICHIA COLI (A)  Final   Report Status 06/15/2020 FINAL  Final   Organism ID, Bacteria ESCHERICHIA COLI (A)  Final      Susceptibility   Escherichia coli - MIC*    AMPICILLIN >=32 RESISTANT Resistant     CEFAZOLIN <=4 SENSITIVE Sensitive     CEFEPIME <=0.12 SENSITIVE Sensitive     CEFTRIAXONE <=0.25 SENSITIVE Sensitive     CIPROFLOXACIN >=4 RESISTANT Resistant     GENTAMICIN <=1 SENSITIVE Sensitive     IMIPENEM <=0.25 SENSITIVE Sensitive     NITROFURANTOIN <=16 SENSITIVE Sensitive     TRIMETH/SULFA >=320 RESISTANT Resistant     AMPICILLIN/SULBACTAM 8  SENSITIVE Sensitive     PIP/TAZO <=4 SENSITIVE  Sensitive     * >=100,000 COLONIES/mL ESCHERICHIA COLI  Blood Culture (routine x 2)     Status: None (Preliminary result)   Collection Time: 06/15/20  5:00 PM   Specimen: BLOOD  Result Value Ref Range Status   Specimen Description BLOOD LEFT ANTECUBITAL  Final   Special Requests   Final    BOTTLES DRAWN AEROBIC AND ANAEROBIC Blood Culture results may not be optimal due to an inadequate volume of blood received in culture bottles   Culture   Final    NO GROWTH 2 DAYS Performed at [facility], [address], [place], [place] [address]    Report Status PENDING  Incomplete  Urine culture     Status: Abnormal (Preliminary result)   Collection Time: 06/15/20  5:20 PM   Specimen: In/Out Cath Urine  Result Value Ref Range Status   Specimen Description   Final    IN/OUT CATH URINE Performed at [facility], [address], [place], [place] [address]    Special Requests   Final    NONE Performed at [facility], [address] [place], [place] [address]    Culture (A)  Final    30,000 COLONIES/mL ESCHERICHIA COLI SUSCEPTIBILITIES TO FOLLOW Performed at [facility], [place] [address] [place], [place] [address]    Report Status PENDING  Incomplete  Resp Panel by RT-PCR (Flu A&B, Covid) Nasopharyngeal Swab     Status: None   Collection Time: 06/15/20  5:20 PM   Specimen: Nasopharyngeal Swab; Nasopharyngeal(NP) swabs in vial transport medium  Result Value Ref Range Status   SARS Coronavirus 2 by RT PCR NEGATIVE NEGATIVE Final    Comment: (NOTE) SARS-CoV-2 target nucleic acids are NOT DETECTED.  The SARS-CoV-2 RNA is generally detectable in upper respiratory specimens during the acute phase of infection. The lowest concentration of SARS-CoV-2 viral copies this assay can detect is 138 copies/mL. A negative result does not preclude SARS-Cov-2 infection and should not be used as the sole basis for treatment or other patient management  decisions. A negative result may occur with  improper specimen collection/handling, submission of specimen other than nasopharyngeal swab, presence of viral mutation(s) within the areas targeted by this assay, and inadequate number of viral copies(<138 copies/mL). A negative result must be combined with clinical observations, patient history, and epidemiological information. The expected result is Negative.  Fact Sheet for Patients:  EntrepreneurPulse.com.au  Fact Sheet for Healthcare Providers:  IncredibleEmployment.be  This test is no t yet approved or cleared by the [place] FDA and  has been authorized for detection and/or diagnosis of SARS-CoV-2 by FDA under an Emergency Use Authorization (EUA). This EUA will remain  in effect (meaning this test can be used) for the duration of the COVID-19 declaration under Section 564(b)(1) of the Act, 21 U.S.C.section 360bbb-3(b)(1), unless the authorization is terminated  or revoked sooner.       Influenza A by PCR NEGATIVE NEGATIVE Final   Influenza B by PCR NEGATIVE NEGATIVE Final    Comment: (NOTE) The Xpert Xpress SARS-CoV-2/FLU/RSV plus assay is intended as an aid in the diagnosis of influenza from Nasopharyngeal swab specimens and should not be used as a sole basis for treatment. Nasal washings and aspirates are unacceptable for Xpert Xpress SARS-CoV-2/FLU/RSV testing.  Fact Sheet for Patients: EntrepreneurPulse.com.au  Fact Sheet for Healthcare Providers: IncredibleEmployment.be  This test is not yet approved or cleared by the [place] FDA and has been authorized for detection and/or diagnosis of SARS-CoV-2 by  FDA under an Emergency Use Authorization (EUA). This EUA will remain in effect (meaning this test can be used) for the duration of the COVID-19 declaration under Section 564(b)(1) of the Act, 21 U.S.C. section 360bbb-3(b)(1), unless the  authorization is terminated or revoked.  Performed at [facility], [address] [place], [place] [address]   Blood Culture (routine x 2)     Status: None (Preliminary result)   Collection Time: 06/15/20  6:17 PM   Specimen: Right Antecubital; Blood  Result Value Ref Range Status   Specimen Description RIGHT ANTECUBITAL  Final   Special Requests   Final    BOTTLES DRAWN AEROBIC AND ANAEROBIC Blood Culture adequate volume   Culture   Final    NO GROWTH 2 DAYS Performed at [facility], [address] [place], [place] [address]    Report Status PENDING  Incomplete      Radiology Studies: CT Head Wo Contrast  Result Date: 06/15/2020 CLINICAL DATA:  Delirium. Additional history provided: Patient with shortness of breath, febrile, confusion, recently diagnosed with UTI. Additional history obtained from [employer] of glioblastoma multiform a and seizures. EXAM: CT HEAD WITHOUT CONTRAST TECHNIQUE: Contiguous axial images were obtained from the base of the skull through the vertex without intravenous contrast. COMPARISON:  Prior brain MRI examinations 05/14/2020 and earlier. Prior head CT examinations 05/14/2020 and earlier. FINDINGS: Brain: Redemonstrated resection cavity within the left parietal lobe and callosal splenium. As before, there is white matter hypoattenuation surrounding the resection cavity, within the callosal splenium and within the posterior cerebral hemispheres bilaterally. This corresponds with the T2/FLAIR hyperintense signal abnormality on the prior brain MRI of 05/14/2020. There is a subtly hyperdense 10 mm nodular focus just to the right of midline along the callosal splenium. This corresponds with the enhancing nodule demonstrated on the prior MRI. Mild ill-defined hypoattenuation elsewhere within the cerebral white matter is nonspecific, but compatible with chronic small vessel ischemic disease. There is no evidence of acute intracranial hemorrhage.  No acute demarcated cortical infarct. No extra-axial fluid collection. No midline shift. Vascular: No hyperdense vessel. Skull: Prior left parietal craniotomy/cranioplasty. No calvarial fracture. Sinuses/Orbits: Visualized orbits show no acute finding. Air-fluid level within the right sphenoid sinus. Mild bilateral ethmoid and left sphenoid sinus mucosal thickening. Other: Right mastoid effusion. IMPRESSION: No evidence of acute infarction or acute intracranial hemorrhage. Redemonstrated surgical cavity within the left parietal lobe and callosal splenium at site of prior brain tumor resection. There is nonspecific matter hypoattenuation surrounding the resection cavity, within the callosal splenium and within the posterior cerebral hemispheres bilaterally. This corresponds with the T2/FLAIR hyperintense signal abnormality at these sites on the prior brain MRI of 05/14/2020. A 10 mm subtly hyperdense focus is present just to the right of midline along the callosal splenium. This likely reflects the nodular enhancing focus demonstrated on the prior MRI and this is suspicious for residual viable tumor. Stable background mild cerebral white matter chronic small vessel ischemic disease. Paranasal sinus disease as described. Correlate for acute sinusitis. Right mastoid effusion. Electronically Signed   By: Kellie Simmering DO   On: 06/15/2020 19:55   CT Angio Chest PE W/Cm &/Or Wo Cm  Result Date: 06/15/2020 CLINICAL DATA:  Shortness of breath and febrile EXAM: CT ANGIOGRAPHY CHEST WITH CONTRAST TECHNIQUE: Multidetector CT imaging of the chest was performed using the standard protocol during bolus administration of intravenous contrast. Multiplanar CT image reconstructions and MIPs were obtained to evaluate the vascular anatomy. CONTRAST:  124mL OMNIPAQUE  IOHEXOL 350 MG/ML SOLN COMPARISON:  Chest x-ray 06/15/2020, CT chest 11/23/2019 FINDINGS: Cardiovascular: Satisfactory opacification of the pulmonary arteries to the  segmental level. No evidence of pulmonary embolism. Nonaneurysmal aorta. No dissection is seen. Normal cardiac size. No significant pericardial effusion. Mediastinum/Nodes: 1.1 cm hypodense nodule in the right lobe of the thyroid, no further workup recommended based on size of lesion and age of patient. Midline trachea. No suspicious adenopathy. Esophagus within normal limits Lungs/Pleura: Small right-sided pleural effusion. Right medial apical ovoid hypodensity measuring 2.4 cm is of fluid density and may represent a small amount of loculated fluid at the right apex. Hazy bilateral lung densities. Partial consolidation in the right greater than left lower lobes. Small foci of consolidation and ground-glass density in the lingula. No pneumothorax. Upper Abdomen: Stable small hyperenhancing focus in the left hepatic lobe. No acute abnormality. Musculoskeletal: Degenerative changes of the spine. No acute or suspicious osseous abnormality. Review of the MIP images confirms the above findings. IMPRESSION: 1. Negative for acute pulmonary embolus or aortic dissection. 2. Small right-sided pleural effusion. Partial consolidation in the right greater than left lower lobes with additional small foci of consolidation and ground-glass density in the lingula, possible bilateral pneumonia. 3. 2.4 cm right medial apical hypodensity of fluid attenuation, possibly representing small loculated apical fluid. Attention on follow-up imaging. Electronically Signed   By: Donavan Foil M.D.   On: 06/15/2020 20:00   [place] CHEST (PLEURAL EFFUSION)  Result Date: 06/16/2020 CLINICAL DATA:  RIGHT pleural effusion, question sufficient for thoracentesis EXAM: CHEST ULTRASOUND COMPARISON:  CT chest 06/15/2020 FINDINGS: Minimal RIGHT pleural effusion is identified. Amount of pleural fluid is insufficient for thoracentesis. IMPRESSION: Insufficient RIGHT pleural effusion for thoracentesis. Electronically Signed   By: Lavonia Dana M.D.   On:  06/16/2020 10:48   DG Chest Port 1 View  Result Date: 06/15/2020 CLINICAL DATA:  Shortness of breath and possible sepsis EXAM: PORTABLE CHEST 1 VIEW COMPARISON:  06/12/2020 FINDINGS: Cardiac shadow is stable. The lungs are well aerated bilaterally. New right basilar airspace opacity is noted consistent with atelectasis or early infiltrate. No other focal airspace opacity is noted. No bony abnormality is seen. IMPRESSION: Likely early infiltrate in the right base. Electronically Signed   By: Inez Catalina M.D.   On: 06/15/2020 18:49    Scheduled Meds: . amLODipine  5 mg Oral Daily  . Chlorhexidine Gluconate Cloth  6 each Topical Daily  . cloNIDine  0.3 mg Oral QHS  . dexamethasone  1 mg Oral Q breakfast  . feeding supplement  237 mL Oral BID BM  . gabapentin  600 mg Oral TID  . levETIRAcetam  1,000 mg Oral BID  . pravastatin  10 mg Oral QHS   Continuous Infusions: . azithromycin Stopped (06/17/20 [id number])  . cefTRIAXone (ROCEPHIN)  IV Stopped (06/17/20 0333)     LOS: 2 days   Time spent: 30 minutes   [place] Cheney, MD [facility]  06/17/2020, 1:15 PM   To contact the attending provider between 7A-7P or the covering provider during after hours 7P-7A, please log into the web site [email or web address].

## 2020-06-17 NOTE — Progress Notes (Signed)
Manufacturing engineer [facility]) Community Based Palliative Care       This patient is enrolled in our palliative care services in the community.  ACC will continue to follow for any discharge planning needs and to coordinate continuation of palliative care.    Thank you for the opportunity to participate in this patient's care.     Domenic Moras, BSN, RN [facility] Liaison   [phone]

## 2020-06-17 NOTE — TOC Progression Note (Signed)
Transition of Care [facility]) - Progression Note    Patient Details  Name: Wanda Ross MRN: [id number] Date of Birth: 11-27-1950  Transition of Care [facility]) CM/SW [place], [place] Phone Number: 06/17/2020, 5:03 PM  Clinical Narrative:    CSW spoke with pts daughter and sister over the phone about PT recommendation of SNF for pt. Family is agreeable to pt referral being sent to [facility] as first choice and [facility] as second choice. Pts Fl2 has been completed and the referrals have been sent. TOC to follow up when bed offers made.   Expected Discharge Plan: [place] Barriers to Discharge: Continued Medical Work up  Expected Discharge Plan and Services Expected Discharge Plan: [place] In-house Referral: Clinical Social Work Discharge Planning Services: NA Post Acute Care Choice: [place] arrangements for the past 2 months: Single Family Home                 DME Arranged: N/A DME Agency: NA       HH Arranged: RN,PT,Nurse's Aide [place]: [place]         Social Determinants of Health (SDOH) Interventions    Readmission Risk Interventions Readmission Risk Prevention Plan 06/16/2020 05/24/2020  Transportation Screening Complete Complete  PCP or Specialist Appt within 3-5 Days - Complete  HRI or [place] - Complete  Social Work Consult for [place] Planning/Counseling - Complete  Palliative Care Screening - Complete  Medication Review Press photographer) Complete Complete  HRI or Home Care Consult Complete -  SW Recovery Care/Counseling Consult Complete -  Palliative Care Screening Not Applicable -  Some recent data might be hidden

## 2020-06-17 NOTE — Progress Notes (Signed)
At 1540 this RN responded to a bed alarm from patient's room. Found patient sliding off of bed and onto the floor. She stated she was looking for the bathroom. Noted feces on the floor and in the bed. Patient very confused and unable to answer orientation questions. She was assisted off of the floor after she was found to not have any injury and her vitals were obtained. MD notified. Charge nurse notified. Family notified. Patient cleaned up and instruction for high fall risk provided.

## 2020-06-17 NOTE — Plan of Care (Signed)
  Problem: Acute Rehab PT Goals(only PT should resolve) Goal: Pt Will Go Supine/Side To Sit Flowsheets (Taken 06/17/2020 1553) Pt will go Supine/Side to Sit: with modified independence Goal: Patient Will Transfer Sit To/From Stand Flowsheets (Taken 06/17/2020 1553) Patient will transfer sit to/from stand: with minimal assist Goal: Pt Will Transfer Bed To Chair/Chair To Bed Flowsheets (Taken 06/17/2020 1553) Pt will Transfer Bed to Chair/Chair to Bed: with min assist Goal: Pt Will Ambulate Flowsheets (Taken 06/17/2020 1553) Pt will Ambulate:  50 feet  with modified independence  with rolling walker  3:55 PM, 06/17/20 Wanda Ross PT DPT  Physical Therapist with [facility]  [phone]

## 2020-06-17 NOTE — Evaluation (Signed)
Physical Therapy Evaluation Patient Details Name: Wanda Ross MRN: [id number] DOB: [birth year] Today's Date: 06/17/2020   History of Present Illness  HPI: Wanda Ross is a 70 y.o. female with medical history significant for Glioblastoma ( followed by Dr. Mickeal Skinner), Seizures, Hypertension and hyperlipidemia who presents to the emergency department via EMS due to shortness of breath and increased confusion.  Patient was seen by physical therapy at home today and she was noted to be febrile with a temperature of 101F, she was also tachycardic (HR in 120s) and presented with increased shortness of breath, PulseOx was checked and was noted to be in low 80s, so EMS was activated and patient was sent to the ED for further evaluation and management.    Clinical Impression  Patient presents supine in bed. Patient with some level of confusion, but is awake and cooperative. Patient had difficulty following directions involving cues with left or right and required Mod A for bed mobility. Patient able to maintain sitting at EOB with BUE support. Patient required Mod A for sit to stand but was unable to remain standing independently, partially due to difficulty following commands for hand placemnet using RW. Patient repeatedly reaches for therapist instead of using AD as instructed. Patient showing mild fatigue with activity and returned to supine in bed with Mod A for LE placement. Patient left supine in bed with call bell in reach and bed alarm set Patient will benefit from continued physical therapy in hospital and recommended venue below to increase strength, balance, endurance for safe ADLs and gait.      Follow Up Recommendations SNF;Supervision/Assistance - 24 hour;Supervision for mobility/OOB    Equipment Recommendations       Recommendations for Other Services       Precautions / Restrictions Precautions Precautions: Fall Precaution Comments: Contact Restrictions Weight Bearing  Restrictions: No      Mobility  Bed Mobility Overal bed mobility: Needs Assistance Bed Mobility: Supine to Sit     Supine to sit: Mod assist     General bed mobility comments: Labored movement, multiple attempts to reposition, needs verbal cues for sequencing    Transfers Overall transfer level: Needs assistance Equipment used: 1 person hand held assist Transfers: Sit to/from Stand Sit to Stand: Mod assist         General transfer comment: cues for sequencing  Ambulation/Gait Ambulation/Gait assistance: Mod assist Gait Distance (Feet): 3 Feet Assistive device: 1 person hand held assist Gait Pattern/deviations: Decreased stride length     General Gait Details: able to take 3 small sidesteps at bed side for repositioning with HHA x 2  Stairs            Wheelchair Mobility    Modified Rankin (Stroke Patients Only)       Balance Overall balance assessment: Needs assistance Sitting-balance support: Feet supported;Bilateral upper extremity supported Sitting balance-Leahy Scale: Poor Sitting balance - Comments: at EOB   Standing balance support: Bilateral upper extremity supported Standing balance-Leahy Scale: Poor Standing balance comment: Requires HHA x 2 for support                             Pertinent Vitals/Pain Pain Assessment: No/denies pain    Home Living Family/patient expects to be discharged to:: Private residence Living Arrangements: Children Available Help at Discharge: Family;Available 24 hours/day Type of Home: House Home Access: Stairs to enter Entrance Stairs-Rails: Can reach both Entrance Stairs-Number of Steps: 2 Home  Layout: One level Home Equipment: Walker - 4 wheels;Shower seat;Cane - single point      Prior Function Level of Independence: Needs assistance   Gait / Transfers Assistance Needed: Reports PLOF ambulating wiht no AD  ADL's / Homemaking Assistance Needed: Reports lives with son and daughter who  assist as needed        Hand Dominance        Extremity/Trunk Assessment   Upper Extremity Assessment Upper Extremity Assessment: Overall WFL for tasks assessed    Lower Extremity Assessment Lower Extremity Assessment: Generalized weakness       Communication   Communication: No difficulties  Cognition Arousal/Alertness: Awake/alert Behavior During Therapy: Agitated Overall Cognitive Status: Difficult to assess                                 General Comments: Patient with some level of confusion, has difficulty follwing directions involving RT and LT side. Possibly lethergy or due to medication. patient intermittently aggitated by bouts of itching and pulling at head and neck area likely related to recent herpes zoster outbreak of RT side      General Comments      Exercises     Assessment/Plan    PT Assessment Patient needs continued PT services  PT Problem List Decreased strength;Decreased balance;Decreased mobility       PT Treatment Interventions DME instruction;Gait training;Stair training;Functional mobility training;Therapeutic activities;Patient/family education;Balance training;Therapeutic exercise    PT Goals (Current goals can be found in the Care Plan section)  Acute Rehab PT Goals Patient Stated Goal: Return home PT Goal Formulation: With patient Time For Goal Achievement: 07/02/20 Potential to Achieve Goals: Good    Frequency Min 3X/week   Barriers to discharge        Co-evaluation               AM-PAC PT "6 Clicks" Mobility  Outcome Measure Help needed turning from your back to your side while in a flat bed without using bedrails?: A Lot Help needed moving from lying on your back to sitting on the side of a flat bed without using bedrails?: A Lot Help needed moving to and from a bed to a chair (including a wheelchair)?: A Lot Help needed standing up from a chair using your arms (e.g., wheelchair or bedside chair)?: A  Lot Help needed to walk in hospital room?: A Lot Help needed climbing 3-5 steps with a railing? : Total 6 Click Score: 11    End of Session Equipment Utilized During Treatment: Gait belt Activity Tolerance: Patient tolerated treatment well Patient left: in bed;with bed alarm set;with call bell/phone within reach Nurse Communication: Mobility status PT Visit Diagnosis: Unsteadiness on feet (R26.81);Other abnormalities of gait and mobility (R26.89);Muscle weakness (generalized) (M62.81)    Time: 1420-1500 PT Time Calculation (min) (ACUTE ONLY): 40 min   Charges:   PT Evaluation $PT Eval Low Complexity: 1 Low PT Treatments $Therapeutic Activity: 8-22 mins    3:53 PM, 06/17/20 Josue Hector PT DPT  Physical Therapist with [facility]  [phone]

## 2020-06-17 NOTE — NC FL2 (Signed)
[place] MEDICAID FL2 LEVEL OF CARE SCREENING TOOL     IDENTIFICATION  Patient Name: Wanda Ross Birthdate: 1951/02/08 Sex: female Admission Date (Current Location): 06/15/2020  [facility] and [place] Number:  [employer] and Address:  [place] [address], [place]      Provider Number: [id number]  Attending Physician Name and Address:  Darliss Cheney, MD  Relative Name and Phone Number:  Babbette Dalesandro (daughter) Ph: [phone]    Current Level of Care: Hospital Recommended Level of Care: [place] Prior Approval Number:    Date Approved/Denied:   PASRR Number: [id number]  Discharge Plan: SNF    Current Diagnoses: Patient Active Problem List   Diagnosis Date Noted  . Post herpetic neuralgia 06/15/2020  . Sepsis ([place]) 06/15/2020  . CAP (community acquired pneumonia) 06/15/2020  . Acute metabolic encephalopathy 49/70/2637  . Pleural effusion on right 06/15/2020  . Elevated d-dimer 06/15/2020  . Zoster with other complications 85/88/5027  . Herpes zoster 05/22/2020  . Acute respiratory failure with hypoxia ([place]) 05/14/2020  . Seizures ([place]) 05/14/2020  . Thrombocytopenia ([place]) 05/14/2020  . Lactic acidosis 05/14/2020  . Hypokalemia 05/14/2020  . Hyperglycemia 05/14/2020  . Pressure injury of skin 05/14/2020  . Goals of care, counseling/discussion 12/15/2019  . Glioblastoma ([place])   . Hypoalbuminemia due to protein-calorie malnutrition ([place])   . Transaminitis   . Benign essential HTN   . Prediabetes   . Seizure prophylaxis   . Glioma ([place]) 12/02/2019  . Right sided weakness   . Dyslipidemia   . Essential hypertension   . Steroid-induced hyperglycemia   . Leucocytosis   . Brain mass 11/22/2019  . 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 Abnormal electrocardiogram (ECG) (EKG) 12/31/2017  . White coat syndrome with diagnosis of hypertension 12/31/2017  . Glaucoma 12/26/2013  . Vitamin D deficiency 05/27/2009   . IMPAIRED GLUCOSE TOLERANCE 05/27/2009  . OSTEOPENIA 05/27/2009  . Multinodular goiter 2010  . Multiple thyroid nodules 11/06/2007  . Hyperlipidemia 11/06/2007  . Malignant hypertension 11/06/2007    Orientation RESPIRATION BLADDER Height & Weight     Self  O2 (2L/min) Incontinent,External catheter Weight: 147 lb 14.9 oz (67.1 kg) Height:  5\' 8"  (172.7 cm)  BEHAVIORAL SYMPTOMS/MOOD NEUROLOGICAL BOWEL NUTRITION STATUS    Convulsions/Seizures Continent Diet (Heart healthy)  AMBULATORY STATUS COMMUNICATION OF NEEDS Skin   Extensive Assist Verbally Other (Comment),Bruising (Rash: right chest)                       Personal Care Assistance Level of Assistance  Bathing,Feeding,Dressing Bathing Assistance: Limited assistance Feeding assistance: Independent Dressing Assistance: Limited assistance     Functional Limitations Info  Sight,Hearing,Speech Sight Info: Adequate Hearing Info: Adequate Speech Info: Adequate    SPECIAL CARE FACTORS FREQUENCY  PT (By licensed PT)     PT Frequency: 5x's/week              Contractures Contractures Info: Not present    Additional Factors Info  Code Status,Allergies,Psychotropic Code Status Info: Full Allergies Info: NKA Psychotropic Info: Neurontin (gabapentin)         Current Medications (06/17/2020):  This is the current hospital active medication list Current Facility-Administered Medications  Medication Dose Route Frequency Provider Last Rate Last Admin  . acetaminophen (TYLENOL) tablet 650 mg  650 mg Oral Q6H PRN Adefeso, Oladapo, DO   650 mg at 06/17/20 1554  . amLODipine (NORVASC) tablet 5 mg  5 mg Oral Daily  Bernadette Hoit, DO   5 mg at 06/17/20 1106  . azithromycin (ZITHROMAX) 500 mg in sodium chloride 0.9 % 250 mL IVPB  500 mg Intravenous Q24H Adefeso, Oladapo, DO   Stopped at 06/17/20 6789  . cefTRIAXone (ROCEPHIN) 1 g in sodium chloride 0.9 % 100 mL IVPB  1 g Intravenous Q24H Adefeso, Oladapo, DO   Stopped at  06/17/20 0333  . Chlorhexidine Gluconate Cloth 2 % PADS 6 each  6 each Topical Daily Darliss Cheney, MD   6 each at 06/17/20 1135  . cloNIDine (CATAPRES) tablet 0.3 mg  0.3 mg Oral QHS Darliss Cheney, MD   0.3 mg at 06/16/20 2149  . dexamethasone (DECADRON) tablet 1 mg  1 mg Oral Q breakfast Adefeso, Oladapo, DO   1 mg at 06/17/20 1219  . diphenhydrAMINE-zinc acetate (BENADRYL) 2-0.1 % cream   Topical TID PRN Darliss Cheney, MD      . feeding supplement (ENSURE ENLIVE / ENSURE PLUS) liquid 237 mL  237 mL Oral BID BM Adefeso, Oladapo, DO   237 mL at 06/17/20 1616  . gabapentin (NEURONTIN) capsule 600 mg  600 mg Oral TID Adefeso, Oladapo, DO   600 mg at 06/17/20 1554  . hydrALAZINE (APRESOLINE) injection 10 mg  10 mg Intravenous Q6H PRN Darliss Cheney, MD      . levETIRAcetam (KEPPRA) tablet 1,000 mg  1,000 mg Oral BID Adefeso, Oladapo, DO   1,000 mg at 06/17/20 1106  . metoprolol tartrate (LOPRESSOR) tablet 12.5 mg  12.5 mg Oral BID Darliss Cheney, MD   12.5 mg at 06/17/20 1554  . pravastatin (PRAVACHOL) tablet 10 mg  10 mg Oral QHS Adefeso, Oladapo, DO   10 mg at 06/16/20 2150     Discharge Medications: Please see discharge summary for a list of discharge medications.  Relevant Imaging Results:  Relevant Lab Results:   Additional Information SSN: [id number]  Iona Beard, [place]

## 2020-06-18 ENCOUNTER — Telehealth: Payer: Self-pay

## 2020-06-18 LAB — CBC WITH DIFFERENTIAL/PLATELET
Abs Immature Granulocytes: 0.19 10*3/uL — ABNORMAL HIGH (ref 0.00–0.07)
Basophils Absolute: 0 10*3/uL (ref 0.0–0.1)
Basophils Relative: 0 %
Eosinophils Absolute: 0 10*3/uL (ref 0.0–0.5)
Eosinophils Relative: 1 %
HCT: 29.6 % — ABNORMAL LOW (ref 36.0–46.0)
Hemoglobin: 9.4 g/dL — ABNORMAL LOW (ref 12.0–15.0)
Immature Granulocytes: 3 %
Lymphocytes Relative: 8 %
Lymphs Abs: 0.5 10*3/uL — ABNORMAL LOW (ref 0.7–4.0)
MCH: 30.4 pg (ref 26.0–34.0)
MCHC: 31.8 g/dL (ref 30.0–36.0)
MCV: 95.8 fL (ref 80.0–100.0)
Monocytes Absolute: 0.2 10*3/uL (ref 0.1–1.0)
Monocytes Relative: 2 %
Neutro Abs: 5.7 10*3/uL (ref 1.7–7.7)
Neutrophils Relative %: 86 %
Platelets: 142 10*3/uL — ABNORMAL LOW (ref 150–400)
RBC: 3.09 MIL/uL — ABNORMAL LOW (ref 3.87–5.11)
RDW: 17 % — ABNORMAL HIGH (ref 11.5–15.5)
WBC: 6.6 10*3/uL (ref 4.0–10.5)
nRBC: 0.3 % — ABNORMAL HIGH (ref 0.0–0.2)

## 2020-06-18 LAB — URINE CULTURE: Culture: 30000 — AB

## 2020-06-18 LAB — BASIC METABOLIC PANEL
Anion gap: 7 (ref 5–15)
BUN: 13 mg/dL (ref 8–23)
CO2: 31 mmol/L (ref 22–32)
Calcium: 8.4 mg/dL — ABNORMAL LOW (ref 8.9–10.3)
Chloride: 103 mmol/L (ref 98–111)
Creatinine, Ser: 0.51 mg/dL (ref 0.44–1.00)
GFR, Estimated: >60 mL/min (ref >60)
Glucose, Bld: 126 mg/dL — ABNORMAL HIGH (ref 70–99)
Potassium: 3.8 mmol/L (ref 3.5–5.1)
Sodium: 141 mmol/L (ref 135–145)

## 2020-06-18 NOTE — Progress Notes (Signed)
Physical Therapy Treatment Patient Details Name: Wanda Ross MRN: [id number] DOB: 1951-01-14 Today's Date: 06/18/2020    History of Present Illness HPI: Wanda Ross is a 70 y.o. female with medical history significant for Glioblastoma ( followed by Dr. Mickeal Skinner), Seizures, Hypertension and hyperlipidemia who presents to the emergency department via EMS due to shortness of breath and increased confusion.  Patient was seen by physical therapy at home today and she was noted to be febrile with a temperature of 101F, she was also tachycardic (HR in 120s) and presented with increased shortness of breath, PulseOx was checked and was noted to be in low 80s, so EMS was activated and patient was sent to the ED for further evaluation and management.    PT Comments    Patient with limited ability to follow instructions this afternoon despite frequent verbal and tactile cueing. Patient would respond "ok" to commands/instructions with very limited carry over following if any response at all. Assisted patient with LE movement and assisted patient with pulling to seated secondary to fatigue and weakness. Patient demonstrates impaired sitting balance requiring assist. Patient assisted back to bed at end of session. Patient will benefit from continued physical therapy in hospital and recommended venue below to increase strength, balance, endurance for safe ADLs and gait.    Follow Up Recommendations  SNF;Supervision/Assistance - 24 hour;Supervision for mobility/OOB     Equipment Recommendations       Recommendations for Other Services       Precautions / Restrictions Precautions Precautions: Fall Precaution Comments: Contact Restrictions Weight Bearing Restrictions: No    Mobility  Bed Mobility Overal bed mobility: Needs Assistance Bed Mobility: Supine to Sit;Sit to Supine     Supine to sit: Mod assist Sit to supine: Mod assist   General bed mobility comments: assist for LE movement  and uprighting trunk to seated  Transfers                    Ambulation/Gait                 Stairs             Wheelchair Mobility    Modified Rankin (Stroke Patients Only)       Balance Overall balance assessment: Needs assistance Sitting-balance support: Single extremity supported;Feet supported Sitting balance-Leahy Scale: Poor Sitting balance - Comments: at EOB                                    Cognition Arousal/Alertness: Awake/alert Behavior During Therapy: Flat affect Overall Cognitive Status: No family/caregiver present to determine baseline cognitive functioning                                 General Comments: Patient with difficulty following instructions with limited carry over to instructions given      Exercises      General Comments        Pertinent Vitals/Pain Pain Assessment: No/denies pain    Home Living                      Prior Function            PT Goals (current goals can now be found in the care plan section) Acute Rehab PT Goals Patient Stated Goal: Return home PT Goal Formulation: With  patient Time For Goal Achievement: 07/02/20 Potential to Achieve Goals: Good Progress towards PT goals: Progressing toward goals    Frequency    Min 3X/week      PT Plan Current plan remains appropriate    Co-evaluation              AM-PAC PT "6 Clicks" Mobility   Outcome Measure  Help needed turning from your back to your side while in a flat bed without using bedrails?: A Lot Help needed moving from lying on your back to sitting on the side of a flat bed without using bedrails?: A Lot Help needed moving to and from a bed to a chair (including a wheelchair)?: A Lot Help needed standing up from a chair using your arms (e.g., wheelchair or bedside chair)?: A Lot Help needed to walk in hospital room?: A Lot Help needed climbing 3-5 steps with a railing? : Total 6  Click Score: 11    End of Session Equipment Utilized During Treatment: Gait belt Activity Tolerance: Patient tolerated treatment well Patient left: in bed;with bed alarm set;with call bell/phone within reach Nurse Communication: Mobility status PT Visit Diagnosis: Unsteadiness on feet (R26.81);Other abnormalities of gait and mobility (R26.89);Muscle weakness (generalized) (M62.81)     Time: 4008-6761 PT Time Calculation (min) (ACUTE ONLY): 15 min  Charges:  $Therapeutic Activity: 8-22 mins                      2:44 PM, 06/18/20 Mearl Latin PT, DPT Physical Therapist at [facility]

## 2020-06-18 NOTE — Telephone Encounter (Signed)
Patient's daugher ([name]) called office stating patient will be discharged from hospital to SNF at [facility].  Patient's daughter wants clarification on when patient should restart her Temodar as it is currently on hold due to patient taking antibiotics. Patient's family is to provide SNF facility with her Temodar.  Please advise on when patient is to restart Temodar.

## 2020-06-18 NOTE — TOC Progression Note (Signed)
Transition of Care [facility]) - Progression Note   Patient Details  Name: Wanda Ross MRN: [id number] Date of Birth: 23-Jan-1951  Transition of Care [facility]) CM/SW [place], LCSW Phone Number: 06/18/2020, 1:43 PM  Clinical Narrative: CSW spoke with Mardene Celeste at [facility]. Per Shireen Quan can make a bed offer pending the family being able to provide patient's chemo medications to the the facility. CSW spoke with daughter to review bed offers. Daughter and sister requested [facility]. CSW explained the family would need to make sure the facility has the chemo meds. Family is agreeable. CSW updated Mardene Celeste that bed offer was accepted. Patient will be able to discharge to [facility] on Monday (06/21/20) as that is when the bed will be available and COVID test will need to be collected the day before. TOC to follow.  Expected Discharge Plan: [place] Barriers to Discharge: Continued Medical Work up  Expected Discharge Plan and Services Expected Discharge Plan: [place] In-house Referral: Clinical Social Work Discharge Planning Services: NA Post Acute Care Choice: [place] arrangements for the past 2 months: Single Family Home               DME Arranged: N/A DME Agency: NA HH Arranged: RN,PT,Nurse's Aide [place]: [place]  Readmission Risk Interventions Readmission Risk Prevention Plan 06/16/2020 05/24/2020  Transportation Screening Complete Complete  PCP or Specialist Appt within 3-5 Days - Complete  HRI or [place] - Complete  Social Work Consult for [place] Planning/Counseling - Complete  Palliative Care Screening - Complete  Medication Review Press photographer) Complete Complete  HRI or Home Care Consult Complete -  SW Recovery Care/Counseling Consult Complete -  Palliative Care Screening Not Applicable -  Some recent data might be hidden

## 2020-06-18 NOTE — Progress Notes (Signed)
PROGRESS NOTE    Wanda Ross  [id number] DOB: [birth year] DOA: 06/15/2020 PCP: Fayrene Helper, MD   Brief Narrative:  HPI: Wanda Ross is a 70 y.o. female with medical history significant for Glioblastoma ( followed by Dr. Mickeal Skinner), Seizures, Hypertension and hyperlipidemia who presents to the emergency department via EMS due to shortness of breath and increased confusion.  Patient was seen by physical therapy at home today and she was noted to be febrile with a temperature of 101F, she was also tachycardic (HR in 120s) and presented with increased shortness of breath, PulseOx was checked and was noted to be in low 80s, so EMS was activated and patient was sent to the ED for further evaluation and management. Patient was seen in the ED on 1/15 and she was diagnosed to have UTI, she was treated with IV ceftriaxone in the ED and discharged home with Augmentin. She was admitted on 12/25 and discharged on 05/25/2020 due to facial herpes zoster involving the right eye and was treated with IV acyclovir until 12/28, when she was transitioned to Valtrex 100 mg 3 times daily for additional 5 days to complete course of therapy. She lives at home alone, but her children were always visiting her at home.  ED Course:  In the emergency department, she was febrile and tachycardic.  O2 sat was 95% on supplemental oxygen via [place] at 6 LPM.  Work-up in the ED showed normocytic anemia, D-dimer 6.06, albumin 2.5, urinalysis showed proteinuria and rare bacteria.  Lactic acid 1.2.  Respiratory panel for influenza A, B and SARS coronavirus 2 was negative.  Urine culture collected on 06/12/2020 showed Escherichia Coli CT angiography chest with contrast was negative for acute pulmonary embolus or aortic dissection, but showed small right-sided pleural effusion and partial consolidation in the right greater than left lower lobes with additional small foci of consolidation and ground-glass density in the  lingula, possible bilateral pneumonia. Chest x-ray showed likely early infiltrate in the right base CTA head without contrast showed no evidence of acute infarction or acute intracranial hemorrhage She was treated with IV antibiotics, IV Ativan 1 mg x 1 was given, Tylenol was given and IV hydration was provided.  Hospitalist was asked to admit patient for further evaluation and management.  Assessment & Plan:   Principal Problem:   Sepsis ([place]) Active Problems:   Hyperlipidemia   Essential hypertension   Hypoalbuminemia due to protein-calorie malnutrition ([place])   Glioblastoma ([place])   Acute respiratory failure with hypoxia (H[facility])   Seizures ([facility])   Post herpetic neuralgia   CAP (community acquired pneumonia)   Acute metabolic encephalopathy   Pleural effusion on right   Elevated d-dimer  Acute metabolic encephalopathy possibly secondary to acute respiratory failure with hypoxia due to severe sepsis sepsis secondary to presumed CAP, POA: Patient meets severe sepsis criteria based on fever, tachycardia, tachypnea and acute encephalopathy.   CT angiography of chest was suggestive of bilateral pneumonia, in lower lobes.  She is more alert and oriented today compared to yesterday.  She is able to answer all the questions about her name, date of birth, the fact that she is currently at [facility] and current month but she missed current year.  Denied any shortness of breath.  Procalcitonin unremarkable.  Urine antigen for Legionella and streptococci negative. COVID-negative.  Continue Rocephin and Zithromax and follow culture and tailor antibiotics currently.  Patient is much more alert and oriented although she still kept her eyes closed during  the whole encounter but she answered all the questions appropriately.  Right-sided pleural effusion CT angiography of chest showed right-sided pleural effusion: IR was consulted however she had an adequate amount for thoracentesis.  UTI  Patient was  recently diagnosed with a UTI and started on Augmentin Urine culture done on 1/15 was positive for E. Coli Urinalysis unimpressive for UTI at this time Any subsequent UTI will be covered by IV ceftriaxone as indicated above for CAP POA  Elevated D-dimer D-dimer 6.06; CT angiography of chest was negative for pulmonary embolism or aortic dissection  Essential hypertension: Blood pressure controlled.  Continue current regimen.  Hyperlipidemia Continue pravastatin  History of glioblastoma Continue Decadron and Temodar  Seizures Continue Keppra  Hypoalbuminemia due to moderate protein calorie malnutrition Albumin 2.5, protein supplement reported  Postherpetic neuralgia Continue Neurontin  Generalized weakness: Assessed by PT OT, they recommend SNF.  Daughter wants her to go to SNF as well and not home.  She wants her to go to [facility] and per [facility], they are okay to take her but they do not have an open bed until Monday.   DVT prophylaxis: SCDs Start: 06/15/20 2043   Code Status: Full Code  Family Communication:  None present at bedside.  Called and updated/discussed with daughter Wanda Ross.    Status is: Inpatient  Remains inpatient appropriate because:Inpatient level of care appropriate due to severity of illness   Dispo: The patient is from: Home              Anticipated d/c is to: Home              Anticipated d/c date is: 2 days              Patient currently is not medically stable to d/c.        Estimated body mass index is 22.49 kg/m as calculated from the following:   Height as of this encounter: 5\' 8"  (1.727 m).   Weight as of this encounter: 67.1 kg.  Pressure Injury 05/14/20 Coccyx Medial Stage 3 -  Full thickness tissue loss. Subcutaneous fat may be visible but bone, tendon or muscle are NOT exposed. One larger stage 3 with smaller stage threes around. (Active)  05/14/20 0700  Location: Coccyx  Location Orientation: Medial  Staging: Stage  3 -  Full thickness tissue loss. Subcutaneous fat may be visible but bone, tendon or muscle are NOT exposed.  Wound Description (Comments): One larger stage 3 with smaller stage threes around.  Present on Admission: Yes     Nutritional status:               Consultants:   None  Procedures:   None  Antimicrobials:  Anti-infectives (From admission, onward)   Start     Dose/Rate Route Frequency Ordered Stop   06/15/20 2000  azithromycin (ZITHROMAX) 500 mg in sodium chloride 0.9 % 250 mL IVPB        500 mg 250 mL/hr over 60 Minutes Intravenous Every 24 hours 06/15/20 1949     06/15/20 1700  cefTRIAXone (ROCEPHIN) 1 g in sodium chloride 0.9 % 100 mL IVPB        1 g 200 mL/hr over 30 Minutes Intravenous Every 24 hours 06/15/20 1647           Subjective: Seen and examined.  She has no complaints.  She is alert and oriented.  Objective: Vitals:   06/17/20 1542 06/17/20 1957 06/18/20 0543 06/18/20 0929  BP:  140/73 117/67 124/69   Pulse: (!) 134 (!) 107 97   Resp: 20 17 16    Temp: 98.5 F (36.9 C) 98.4 F (36.9 C) 98.1 F (36.7 C)   TempSrc: Oral     SpO2: 97% 97% 99% 91%  Weight:      Height:        Intake/Output Summary (Last 24 hours) at 06/18/2020 1330 Last data filed at 06/18/2020 0931 Gross per 24 hour  Intake 720 ml  Output 1125 ml  Net -405 ml   Filed Weights   06/15/20 1648  Weight: 67.1 kg    Examination:  General exam: Appears calm and comfortable  Respiratory system: Rhonchi bilaterally. Respiratory effort normal. Cardiovascular system: S1 & S2 heard, RRR. No JVD, murmurs, rubs, gallops or clicks. No pedal edema. Gastrointestinal system: Abdomen is nondistended, soft and nontender. No organomegaly or masses felt. Normal bowel sounds heard. Central nervous system: Alert and oriented. No focal neurological deficits. Extremities: Symmetric 5 x 5 power. Skin: Scabs at the dermatome involving right anterior chest, right anterior top and  posterior shoulder. Psychiatry: Judgement and insight appear normal. Mood & affect appropriate.    Data Reviewed: I have personally reviewed following labs and imaging studies  CBC: Recent Labs  Lab 06/12/20 0945 06/15/20 1700 06/16/20 0348 06/17/20 0627 06/18/20 0631  WBC 12.7* 7.1 6.9 6.1 6.6  NEUTROABS 11.4* 5.9  --  5.3 5.7  HGB 10.6* 10.5* 10.5* 9.1* 9.4*  HCT 30.9* 33.0* 32.5* 27.7* 29.6*  MCV 93.6 96.5 94.2 95.2 95.8  PLT 123* 157 136* 133* [id number]*   Basic Metabolic Panel: Recent Labs  Lab 06/12/20 0945 06/15/20 1700 06/16/20 0348 06/17/20 0627 06/18/20 0631  NA 136 144 141 139 141  K 3.6 4.0 2.8* 3.4* 3.8  CL 100 108 103 102 103  CO2 25 27 26 28 31   GLUCOSE 127* 98 97 94 126*  BUN 22 21 10 12 13   CREATININE 0.76 0.64 0.51 0.56 0.51  CALCIUM 8.2* 8.4* 8.2* 8.1* 8.4*  MG  --   --  1.7  --   --   PHOS  --   --  2.6  --   --    GFR: Estimated Creatinine Clearance: 65.1 mL/min (by C-G formula based on SCr of 0.51 mg/dL). Liver Function Tests: Recent Labs  Lab 06/12/20 0945 06/15/20 1700 06/16/20 0348  AST 20 28 30   ALT 33 37 37  ALKPHOS 63 69 68  BILITOT 1.1 0.7 0.7  PROT 6.3* 6.7 6.3*  ALBUMIN 2.5* 2.5* 2.4*   No results for input(s): LIPASE, AMYLASE in the last 168 hours. No results for input(s): AMMONIA in the last 168 hours. Coagulation Profile: Recent Labs  Lab 06/15/20 1817 06/16/20 0348  INR 1.2 1.3*   Cardiac Enzymes: No results for input(s): CKTOTAL, CKMB, CKMBINDEX, TROPONINI in the last 168 hours. BNP (last 3 results) No results for input(s): PROBNP in the last 8760 hours. HbA1C: No results for input(s): HGBA1C in the last 72 hours. CBG: Recent Labs  Lab 06/17/20 0747 06/17/20 1104  GLUCAP 77 151*   Lipid Profile: No results for input(s): CHOL, HDL, LDLCALC, TRIG, CHOLHDL, LDLDIRECT in the last 72 hours. Thyroid Function Tests: No results for input(s): TSH, T4TOTAL, FREET4, T3FREE, THYROIDAB in the last 72 hours. Anemia  Panel: No results for input(s): VITAMINB12, FOLATE, FERRITIN, TIBC, IRON, RETICCTPCT in the last 72 hours. Sepsis Labs: Recent Labs  Lab 06/15/20 1700 06/15/20 1817 06/15/20 1948  PROCALCITON  --  0.20  --  LATICACIDVEN 1.2  --  1.0    Recent Results (from the past 240 hour(s))  Resp Panel by RT-PCR (Flu A&B, Covid) Nasopharyngeal Swab     Status: None   Collection Time: 06/12/20  9:38 AM   Specimen: Nasopharyngeal Swab; Nasopharyngeal(NP) swabs in vial transport medium  Result Value Ref Range Status   SARS Coronavirus 2 by RT PCR NEGATIVE NEGATIVE Final    Comment: (NOTE) SARS-CoV-2 target nucleic acids are NOT DETECTED.  The SARS-CoV-2 RNA is generally detectable in upper respiratory specimens during the acute phase of infection. The lowest concentration of SARS-CoV-2 viral copies this assay can detect is 138 copies/mL. A negative result does not preclude SARS-Cov-2 infection and should not be used as the sole basis for treatment or other patient management decisions. A negative result may occur with  improper specimen collection/handling, submission of specimen other than nasopharyngeal swab, presence of viral mutation(s) within the areas targeted by this assay, and inadequate number of viral copies(<138 copies/mL). A negative result must be combined with clinical observations, patient history, and epidemiological information. The expected result is Negative.  Fact Sheet for Patients:  EntrepreneurPulse.com.au  Fact Sheet for Healthcare Providers:  IncredibleEmployment.be  This test is no t yet approved or cleared by the [place] FDA and  has been authorized for detection and/or diagnosis of SARS-CoV-2 by FDA under an Emergency Use Authorization (EUA). This EUA will remain  in effect (meaning this test can be used) for the duration of the COVID-19 declaration under Section 564(b)(1) of the Act, 21 U.S.C.section 360bbb-3(b)(1),  unless the authorization is terminated  or revoked sooner.       Influenza A by PCR NEGATIVE NEGATIVE Final   Influenza B by PCR NEGATIVE NEGATIVE Final    Comment: (NOTE) The Xpert Xpress SARS-CoV-2/FLU/RSV plus assay is intended as an aid in the diagnosis of influenza from Nasopharyngeal swab specimens and should not be used as a sole basis for treatment. Nasal washings and aspirates are unacceptable for Xpert Xpress SARS-CoV-2/FLU/RSV testing.  Fact Sheet for Patients: EntrepreneurPulse.com.au  Fact Sheet for Healthcare Providers: IncredibleEmployment.be  This test is not yet approved or cleared by the [place] FDA and has been authorized for detection and/or diagnosis of SARS-CoV-2 by FDA under an Emergency Use Authorization (EUA). This EUA will remain in effect (meaning this test can be used) for the duration of the COVID-19 declaration under Section 564(b)(1) of the Act, 21 U.S.C. section 360bbb-3(b)(1), unless the authorization is terminated or revoked.  Performed at [facility], [address] [place], [place] [address]   Urine culture     Status: Abnormal   Collection Time: 06/12/20 11:43 AM   Specimen: Urine, Clean Catch  Result Value Ref Range Status   Specimen Description   Final    URINE, CLEAN CATCH Performed at [facility], [address], [place], [place] [address]    Special Requests   Final    NONE Performed at [facility], [address] [place], [place] [address]    Culture >=100,000 COLONIES/mL ESCHERICHIA COLI (A)  Final   Report Status 06/15/2020 FINAL  Final   Organism ID, Bacteria ESCHERICHIA COLI (A)  Final      Susceptibility   Escherichia coli - MIC*    AMPICILLIN >=32 RESISTANT Resistant     CEFAZOLIN <=4 SENSITIVE Sensitive     CEFEPIME <=0.12 SENSITIVE Sensitive     CEFTRIAXONE <=0.25 SENSITIVE Sensitive     CIPROFLOXACIN >=4 RESISTANT Resistant     GENTAMICIN <=  1 SENSITIVE Sensitive      IMIPENEM <=0.25 SENSITIVE Sensitive     NITROFURANTOIN <=16 SENSITIVE Sensitive     TRIMETH/SULFA >=320 RESISTANT Resistant     AMPICILLIN/SULBACTAM 8 SENSITIVE Sensitive     PIP/TAZO <=4 SENSITIVE Sensitive     * >=100,000 COLONIES/mL ESCHERICHIA COLI  Blood Culture (routine x 2)     Status: None (Preliminary result)   Collection Time: 06/15/20  5:00 PM   Specimen: BLOOD  Result Value Ref Range Status   Specimen Description BLOOD LEFT ANTECUBITAL  Final   Special Requests   Final    BOTTLES DRAWN AEROBIC AND ANAEROBIC Blood Culture results may not be optimal due to an inadequate volume of blood received in culture bottles   Culture   Final    NO GROWTH 3 DAYS Performed at [facility], [address] [place], [place] [address]    Report Status PENDING  Incomplete  Urine culture     Status: Abnormal   Collection Time: 06/15/20  5:20 PM   Specimen: In/Out Cath Urine  Result Value Ref Range Status   Specimen Description   Final    IN/OUT CATH URINE Performed at [facility], [address] [place], [place] [address]    Special Requests   Final    NONE Performed at [facility], [address], [place], [place] [address]    Culture 30,000 COLONIES/mL ESCHERICHIA COLI (A)  Final   Report Status 06/18/2020 FINAL  Final   Organism ID, Bacteria ESCHERICHIA COLI (A)  Final      Susceptibility   Escherichia coli - MIC*    AMPICILLIN >=32 RESISTANT Resistant     CEFAZOLIN <=4 SENSITIVE Sensitive     CEFEPIME <=0.12 SENSITIVE Sensitive     CEFTRIAXONE <=0.25 SENSITIVE Sensitive     CIPROFLOXACIN >=4 RESISTANT Resistant     GENTAMICIN <=1 SENSITIVE Sensitive     IMIPENEM <=0.25 SENSITIVE Sensitive     NITROFURANTOIN <=16 SENSITIVE Sensitive     TRIMETH/SULFA >=320 RESISTANT Resistant     AMPICILLIN/SULBACTAM 16 INTERMEDIATE Intermediate     PIP/TAZO <=4 SENSITIVE Sensitive     * 30,000 COLONIES/mL ESCHERICHIA COLI  Resp Panel by RT-PCR (Flu A&B, Covid) Nasopharyngeal Swab      Status: None   Collection Time: 06/15/20  5:20 PM   Specimen: Nasopharyngeal Swab; Nasopharyngeal(NP) swabs in vial transport medium  Result Value Ref Range Status   SARS Coronavirus 2 by RT PCR NEGATIVE NEGATIVE Final    Comment: (NOTE) SARS-CoV-2 target nucleic acids are NOT DETECTED.  The SARS-CoV-2 RNA is generally detectable in upper respiratory specimens during the acute phase of infection. The lowest concentration of SARS-CoV-2 viral copies this assay can detect is 138 copies/mL. A negative result does not preclude SARS-Cov-2 infection and should not be used as the sole basis for treatment or other patient management decisions. A negative result may occur with  improper specimen collection/handling, submission of specimen other than nasopharyngeal swab, presence of viral mutation(s) within the areas targeted by this assay, and inadequate number of viral copies(<138 copies/mL). A negative result must be combined with clinical observations, patient history, and epidemiological information. The expected result is Negative.  Fact Sheet for Patients:  EntrepreneurPulse.com.au  Fact Sheet for Healthcare Providers:  IncredibleEmployment.be  This test is no t yet approved or cleared by the [place] FDA and  has been authorized for detection and/or diagnosis of SARS-CoV-2 by FDA under an Emergency Use Authorization (EUA). This EUA will remain  in effect (meaning this test can be used) for the duration of the COVID-19 declaration under Section 564(b)(1) of the Act, 21 U.S.C.section 360bbb-3(b)(1), unless the authorization is terminated  or revoked sooner.       Influenza A by PCR NEGATIVE NEGATIVE Final   Influenza B by PCR NEGATIVE NEGATIVE Final    Comment: (NOTE) The Xpert Xpress SARS-CoV-2/FLU/RSV plus assay is intended as an aid in the diagnosis of influenza from Nasopharyngeal swab specimens and should not be used as a sole basis  for treatment. Nasal washings and aspirates are unacceptable for Xpert Xpress SARS-CoV-2/FLU/RSV testing.  Fact Sheet for Patients: EntrepreneurPulse.com.au  Fact Sheet for Healthcare Providers: IncredibleEmployment.be  This test is not yet approved or cleared by the [place] FDA and has been authorized for detection and/or diagnosis of SARS-CoV-2 by FDA under an Emergency Use Authorization (EUA). This EUA will remain in effect (meaning this test can be used) for the duration of the COVID-19 declaration under Section 564(b)(1) of the Act, 21 U.S.C. section 360bbb-3(b)(1), unless the authorization is terminated or revoked.  Performed at [facility], [address], [place], [place] [address]   Blood Culture (routine x 2)     Status: None (Preliminary result)   Collection Time: 06/15/20  6:17 PM   Specimen: Right Antecubital; Blood  Result Value Ref Range Status   Specimen Description RIGHT ANTECUBITAL  Final   Special Requests   Final    BOTTLES DRAWN AEROBIC AND ANAEROBIC Blood Culture adequate volume   Culture   Final    NO GROWTH 3 DAYS Performed at [facility], [address] [place], [place] [address]    Report Status PENDING  Incomplete      Radiology Studies: No results found.  Scheduled Meds: . amLODipine  5 mg Oral Daily  . Chlorhexidine Gluconate Cloth  6 each Topical Daily  . cloNIDine  0.3 mg Oral QHS  . dexamethasone  1 mg Oral Q breakfast  . feeding supplement  237 mL Oral BID BM  . gabapentin  600 mg Oral TID  . levETIRAcetam  1,000 mg Oral BID  . metoprolol tartrate  12.5 mg Oral BID  . pravastatin  10 mg Oral QHS   Continuous Infusions: . azithromycin 500 mg (06/18/20 0319)  . cefTRIAXone (ROCEPHIN)  IV 1 g (06/18/20 0516)     LOS: 3 days   Time spent: 29 minutes   Darliss [place], MD [facility]  06/18/2020, 1:30 PM   To contact the attending provider between 7A-7P or the covering provider  during after hours 7P-7A, please log into the web site [email or web address].

## 2020-06-18 NOTE — Evaluation (Signed)
Occupational Therapy Evaluation Patient Details Name: Wanda Ross MRN: [id number] DOB: [birth year] Today's Date: 06/18/2020    History of Present Illness HPI: Wanda Ross is a 70 y.o. female with medical history significant for Glioblastoma ( followed by Dr. Mickeal Skinner), Seizures, Hypertension and hyperlipidemia who presents to the emergency department via EMS due to shortness of breath and increased confusion.  Patient was seen by physical therapy at home today and she was noted to be febrile with a temperature of 101F, she was also tachycardic (HR in 120s) and presented with increased shortness of breath, PulseOx was checked and was noted to be in low 80s, so EMS was activated and patient was sent to the ED for further evaluation and management.   Clinical Impression   Pt agreeable to OT evaluation this am, RN present as well. Pt with improved cognition and participation compared to PT session yesterday. Pt able to perform bed mobility with min guard/min assist, standing at EOB for ~4 minutes with RN performing peri-care. Pt marching in place, using one UE at bedrail for support. Pt performing ADLs with set-up while seated at EOB and at bed level. Recommend HH OT on discharge provided pt family able to provide 24/7 supervision.     Follow Up Recommendations  Home health OT;Supervision/Assistance - 24 hour    Equipment Recommendations  3 in 1 bedside commode       Precautions / Restrictions Precautions Precautions: Fall Restrictions Weight Bearing Restrictions: No      Mobility Bed Mobility Overal bed mobility: Needs Assistance Bed Mobility: Supine to Sit;Sit to Supine     Supine to sit: Min assist Sit to supine: Min assist   General bed mobility comments: min assist for pushing up to EOB and for sliding legs over once back in bed    Transfers Overall transfer level: Needs assistance Equipment used: 1 person hand held assist Transfers: Sit to/from Stand Sit to  Stand: Min guard         General transfer comment: cues for sequencing    Balance Overall balance assessment: Needs assistance Sitting-balance support: Single extremity supported;Feet supported Sitting balance-Leahy Scale: Normal Sitting balance - Comments: at EOB   Standing balance support: Single extremity supported Standing balance-Leahy Scale: Fair                             ADL either performed or assessed with clinical judgement   ADL Overall ADL's : Needs assistance/impaired Eating/Feeding: Set up;Sitting Eating/Feeding Details (indicate cue type and reason): set up and cuing for where items are located and what they ar Grooming: Set up;Sitting;Bed level           Upper Body Dressing : Moderate assistance;Sitting Upper Body Dressing Details (indicate cue type and reason): assist for managing gown ties and buttons     Toilet Transfer: Min Designer, jewellery Details (indicate cue type and reason): simulated at bedside Toileting- Clothing Manipulation and Hygiene: Maximal assistance;Sit to/from stand Toileting - Clothing Manipulation Details (indicate cue type and reason): pt standing at bedside for peri-care, able to stand for ~4 minutes without difficulty, one hand lightly gripping bedrail             Vision Patient Visual Report: No change from baseline              Pertinent Vitals/Pain Pain Assessment: No/denies pain     Hand Dominance Left   Extremity/Trunk Assessment Upper Extremity Assessment Upper  Extremity Assessment: Generalized weakness   Lower Extremity Assessment Lower Extremity Assessment: Defer to PT evaluation       Communication Communication Communication: No difficulties   Cognition Arousal/Alertness: Awake/alert Behavior During Therapy: WFL for tasks assessed/performed Overall Cognitive Status: Within Functional Limits for tasks assessed                                 General  Comments: oriented to person, place, and situation. Giving the day for all time questions              Home Living Family/patient expects to be discharged to:: Private residence Living Arrangements: Children Available Help at Discharge: Family;Available 24 hours/day Type of Home: House Home Access: Stairs to enter [employer] of Steps: 2 Entrance Stairs-Rails: Can reach both Home Layout: One level     Bathroom Shower/Tub: Teacher, early years/pre: Handicapped height     Home Equipment: Environmental consultant - 4 wheels;Shower seat;Cane - single point   Additional Comments: uses rollator outside      Prior Functioning/Environment Level of Independence: Needs assistance  Gait / Transfers Assistance Needed: Reports PLOF ambulating wiht no AD, rollator in the community ADL's / Homemaking Assistance Needed: Reports lives with son and daughter who assist as needed, mostly with bathing            OT Problem List: Decreased strength;Decreased activity tolerance;Impaired balance (sitting and/or standing);Decreased safety awareness;Decreased knowledge of use of DME or AE      OT Treatment/Interventions: Self-care/ADL training;Therapeutic exercise;DME and/or AE instruction;Patient/family education    OT Goals(Current goals can be found in the care plan section) Acute Rehab OT Goals Patient Stated Goal: Return home with family to assist OT Goal Formulation: With patient Time For Goal Achievement: 07/02/20 Potential to Achieve Goals: Good  OT Frequency: Min 2X/week    End of Session Equipment Utilized During Treatment: Oxygen  Activity Tolerance: Patient tolerated treatment well Patient left: in bed;with call bell/phone within reach;with bed alarm set;with nursing/sitter in room  OT Visit Diagnosis: Muscle weakness (generalized) (M62.81)                Time: 8657-8469 OT Time Calculation (min): 31 min Charges:  OT General Charges $OT Visit: 1 Visit OT  Evaluation $OT Eval Low Complexity: [place], OTR/L  [phone] 06/18/2020, 9:54 AM

## 2020-06-18 NOTE — Plan of Care (Signed)
  Problem: Acute Rehab OT Goals (only OT should resolve) Goal: Pt. Will Perform Eating Flowsheets (Taken 06/18/2020 0958) Pt Will Perform Eating:  with set-up  sitting Goal: Pt. Will Perform Grooming Flowsheets (Taken 06/18/2020 0958) Pt Will Perform Grooming:  with supervision  sitting  standing Goal: Pt. Will Perform Upper Body Dressing Flowsheets (Taken 06/18/2020 0958) Pt Will Perform Upper Body Dressing:  with min assist  sitting  standing Goal: Pt. Will Transfer To Toilet Flowsheets (Taken 06/18/2020 [phone]) Pt Will Transfer to Toilet:  with min guard assist  ambulating  regular height toilet  bedside commode Goal: Pt. Will Perform Toileting-Clothing Manipulation Flowsheets (Taken 06/18/2020 0958) Pt Will Perform Toileting - Clothing Manipulation and hygiene:  with min guard assist  sitting/lateral leans  sit to/from stand Goal: Pt/Caregiver Will Perform Home Exercise Program Flowsheets (Taken 06/18/2020 [phone]) Pt/caregiver will Perform Home Exercise Program:  Increased strength  Both right and left upper extremity  With Supervision  With written HEP provided

## 2020-06-18 NOTE — Care Management Important Message (Signed)
Important Message  Patient Details  Name: Wanda Ross MRN: [id number] Date of Birth: May 23, 1951   Medicare Important Message Given:  Yes - Important Message mailed due to current National Emergency     Tommy Medal 06/18/2020, 3:57 PM

## 2020-06-19 NOTE — Progress Notes (Signed)
Patient's oxygen sats in the 80s. Placed her on 2LPM via [place].Oxygen sat came up to 91% on 2LPM

## 2020-06-19 NOTE — Progress Notes (Signed)
PROGRESS NOTE    Wanda Ross  WFU:[id number] DOB: [birth year] DOA: 06/15/2020 PCP: Fayrene Helper, MD   Brief Narrative:  Wanda Ross is a 70 y.o. female with medical history significant for Glioblastoma ( followed by Dr. Mickeal Skinner), Seizures, Hypertension and hyperlipidemia who presented to ED via EMS due to shortness of breath and increased confusion.  Patient was seen by physical therapy at home and she was noted to be febrile with a temperature of 101F, she was also tachycardic (HR in 120s) and presented with increased shortness of breath, PulseOx was checked and was noted to be in low 80s, so EMS was activated and patient was sent to the ED for further evaluation and management.  Patient was seen in the ED on 1/15 and she was diagnosed to have UTI, she was treated with IV ceftriaxone in the ED and discharged home with Augmentin. She was admitted on 12/25 and discharged on 05/25/2020 due to facial herpes zoster involving the right eye and was treated with IV acyclovir until 12/28, when she was transitioned to Valtrex 100 mg 3 times daily for additional 5 days to complete course of therapy. She lives at home alone, but her children were always visiting her at home.  This time on 06/15/2020 , in the emergency department, she was febrile and tachycardic.  O2 sat was 95% on supplemental oxygen via [place] at 6 LPM.  Respiratory panel for influenza A, B and SARS coronavirus 2 was negative.  CT angiography chest with contrast was negative for acute pulmonary embolus or aortic dissection, but showed small right-sided pleural effusion and partial consolidation in the right greater than left lower lobes with additional small foci of consolidation and ground-glass density in the lingula, possible bilateral pneumonia. Chest x-ray showed likely early infiltrate in the right base CTA head without contrast showed no evidence of acute infarction or acute intracranial hemorrhage She was treated with  IV antibiotics, IV Ativan 1 mg x 1 was given, Tylenol was given and IV hydration was provided.  Hospitalist was asked to admit patient for further evaluation and management.  Patient was admitted due to severe sepsis secondary to community-acquired pneumonia and acute metabolic encephalopathy for the same reason.  She was started on Rocephin and Zithromax.  Patient's mental status improved.  Assessment & Plan:   Principal Problem:   Sepsis ([place]) Active Problems:   Hyperlipidemia   Essential hypertension   Hypoalbuminemia due to protein-calorie malnutrition ([place])   Glioblastoma ([place])   Acute respiratory failure with hypoxia (H[facility])   Seizures (H[facility])   Post herpetic neuralgia   CAP (community acquired pneumonia)   Acute metabolic encephalopathy   Pleural effusion on right   Elevated d-dimer  Acute metabolic encephalopathy possibly secondary to acute respiratory failure with hypoxia due to severe sepsis sepsis secondary to presumed CAP, POA: Patient meets severe sepsis criteria based on fever, tachycardia, tachypnea and acute encephalopathy.   CT angiography of chest was suggestive of bilateral pneumonia, in lower lobes.  She is more alert and oriented today compared to yesterday.  She is able to answer all the questions about her name, date of birth, the fact that she is currently at [place] and current month but she missed current year.  Denied any shortness of breath.  Procalcitonin unremarkable.  Urine antigen for Legionella and streptococci negative. COVID-negative.  Continue Rocephin and Zithromax and follow culture and tailor antibiotics currently.  Patient once again is slightly lethargic and confused and once again she has  mittens in hands now.  Although when prompted, she is able to answer questions appropriately regarding orientation.  There is a very low-grade fever of 100.4 last night.  Right-sided pleural effusion CT angiography of chest showed right-sided pleural effusion: IR was  consulted however she had an adequate amount for thoracentesis.  UTI  Patient was recently diagnosed with a UTI and started on Augmentin Urine culture done on 1/15 was positive for E. Coli Urinalysis unimpressive for UTI at this time Any subsequent UTI will be covered by IV ceftriaxone as indicated above for CAP POA  Elevated D-dimer D-dimer 6.06; CT angiography of chest was negative for pulmonary embolism or aortic dissection  Essential hypertension: Blood pressure controlled.  Continue current regimen.  Hyperlipidemia Continue pravastatin  History of glioblastoma Continue Decadron and Temodar  Seizures Continue Keppra  Hypoalbuminemia due to moderate protein calorie malnutrition Albumin 2.5, protein supplement reported  Postherpetic neuralgia Continue Neurontin  Generalized weakness: Assessed by PT OT, they recommend SNF.  Daughter wants her to go to SNF as well and not home.  She wants her to go to [facility] and per [facility], they are okay to take her but they do not have an open bed until Monday.   DVT prophylaxis: SCDs Start: 06/15/20 2043   Code Status: Full Code  Family Communication:  None present at bedside.  Called and updated/discussed with daughter Mckinney Glass yesterday.  Status is: Inpatient  Remains inpatient appropriate because:Inpatient level of care appropriate due to severity of illness   Dispo: The patient is from: Home              Anticipated d/c is to: Home              Anticipated d/c date is: 2 days              Patient currently is not medically stable to d/c.        Estimated body mass index is 22.49 kg/m as calculated from the following:   Height as of this encounter: 5\' 8"  (1.727 m).   Weight as of this encounter: 67.1 kg.  Pressure Injury 05/14/20 Coccyx Medial Stage 3 -  Full thickness tissue loss. Subcutaneous fat may be visible but bone, tendon or muscle are NOT exposed. One larger stage 3 with smaller stage threes  around. (Active)  05/14/20 0700  Location: Coccyx  Location Orientation: Medial  Staging: Stage 3 -  Full thickness tissue loss. Subcutaneous fat may be visible but bone, tendon or muscle are NOT exposed.  Wound Description (Comments): One larger stage 3 with smaller stage threes around.  Present on Admission: Yes     Nutritional status:               Consultants:   None  Procedures:   None  Antimicrobials:  Anti-infectives (From admission, onward)   Start     Dose/Rate Route Frequency Ordered Stop   06/15/20 2000  azithromycin (ZITHROMAX) 500 mg in sodium chloride 0.9 % 250 mL IVPB        500 mg 250 mL/hr over 60 Minutes Intravenous Every 24 hours 06/15/20 1949     06/15/20 1700  cefTRIAXone (ROCEPHIN) 1 g in sodium chloride 0.9 % 100 mL IVPB        1 g 200 mL/hr over 30 Minutes Intravenous Every 24 hours 06/15/20 1647           Subjective: Patient seen and examined.  Laying in the bed with mittens in  the hands.  Slightly lethargic but oriented when prompted to provide answers.  Has no complaints.  Not on any oxygen anymore.  Objective: Vitals:   06/18/20 1523 06/18/20 2038 06/19/20 0524 06/19/20 1047  BP:  140/84 113/60 134/74  Pulse: (!) 110 100 (!) 101 (!) 105  Resp:  20 20   Temp:  (!) 100.4 F (38 C) 99.8 F (37.7 C)   TempSrc:  Oral Oral   SpO2: 95% 96% 91% 91%  Weight:      Height:        Intake/Output Summary (Last 24 hours) at 06/19/2020 1130 Last data filed at 06/19/2020 0900 Gross per 24 hour  Intake 240 ml  Output 500 ml  Net -260 ml   Filed Weights   06/15/20 1648  Weight: 67.1 kg    Examination:  General exam: Appears calm and comfortable but slightly lethargic Respiratory system: Rhonchi bilaterally. Respiratory effort normal. Cardiovascular system: S1 & S2 heard, RRR. No JVD, murmurs, rubs, gallops or clicks. No pedal edema. Gastrointestinal system: Abdomen is nondistended, soft and nontender. No organomegaly or masses  felt. Normal bowel sounds heard. Central nervous system: Alert and oriented x3. No focal neurological deficits. Extremities: Symmetric 5 x 5 power. Skin: Scabs present in right anterior chest, right anterior superior, lateral and posterior shoulder.  Data Reviewed: I have personally reviewed following labs and imaging studies  CBC: Recent Labs  Lab 06/15/20 1700 06/16/20 0348 06/17/20 0627 06/18/20 0631  WBC 7.1 6.9 6.1 6.6  NEUTROABS 5.9  --  5.3 5.7  HGB 10.5* 10.5* 9.1* 9.4*  HCT 33.0* 32.5* 27.7* 29.6*  MCV 96.5 94.2 95.2 95.8  PLT 157 136* 133* [id number]*   Basic Metabolic Panel: Recent Labs  Lab 06/15/20 1700 06/16/20 0348 06/17/20 0627 06/18/20 0631  NA 144 141 139 141  K 4.0 2.8* 3.4* 3.8  CL 108 103 102 103  CO2 27 26 28 31   GLUCOSE 98 97 94 126*  BUN 21 10 12 13   CREATININE 0.64 0.51 0.56 0.51  CALCIUM 8.4* 8.2* 8.1* 8.4*  MG  --  1.7  --   --   PHOS  --  2.6  --   --    GFR: Estimated Creatinine Clearance: 65.1 mL/min (by C-G formula based on SCr of 0.51 mg/dL). Liver Function Tests: Recent Labs  Lab 06/15/20 1700 06/16/20 0348  AST 28 30  ALT 37 37  ALKPHOS 69 68  BILITOT 0.7 0.7  PROT 6.7 6.3*  ALBUMIN 2.5* 2.4*   No results for input(s): LIPASE, AMYLASE in the last 168 hours. No results for input(s): AMMONIA in the last 168 hours. Coagulation Profile: Recent Labs  Lab 06/15/20 1817 06/16/20 0348  INR 1.2 1.3*   Cardiac Enzymes: No results for input(s): CKTOTAL, CKMB, CKMBINDEX, TROPONINI in the last 168 hours. BNP (last 3 results) No results for input(s): PROBNP in the last 8760 hours. HbA1C: No results for input(s): HGBA1C in the last 72 hours. CBG: Recent Labs  Lab 06/17/20 0747 06/17/20 1104  GLUCAP 77 151*   Lipid Profile: No results for input(s): CHOL, HDL, LDLCALC, TRIG, CHOLHDL, LDLDIRECT in the last 72 hours. Thyroid Function Tests: No results for input(s): TSH, T4TOTAL, FREET4, T3FREE, THYROIDAB in the last 72  hours. Anemia Panel: No results for input(s): VITAMINB12, FOLATE, FERRITIN, TIBC, IRON, RETICCTPCT in the last 72 hours. Sepsis Labs: Recent Labs  Lab 06/15/20 1700 06/15/20 1817 06/15/20 1948  PROCALCITON  --  0.20  --   LATICACIDVEN 1.2  --  1.0    Recent Results (from the past 240 hour(s))  Resp Panel by RT-PCR (Flu A&B, Covid) Nasopharyngeal Swab     Status: None   Collection Time: 06/12/20  9:38 AM   Specimen: Nasopharyngeal Swab; Nasopharyngeal(NP) swabs in vial transport medium  Result Value Ref Range Status   SARS Coronavirus 2 by RT PCR NEGATIVE NEGATIVE Final    Comment: (NOTE) SARS-CoV-2 target nucleic acids are NOT DETECTED.  The SARS-CoV-2 RNA is generally detectable in upper respiratory specimens during the acute phase of infection. The lowest concentration of SARS-CoV-2 viral copies this assay can detect is 138 copies/mL. A negative result does not preclude SARS-Cov-2 infection and should not be used as the sole basis for treatment or other patient management decisions. A negative result may occur with  improper specimen collection/handling, submission of specimen other than nasopharyngeal swab, presence of viral mutation(s) within the areas targeted by this assay, and inadequate number of viral copies(<138 copies/mL). A negative result must be combined with clinical observations, patient history, and epidemiological information. The expected result is Negative.  Fact Sheet for Patients:  EntrepreneurPulse.com.au  Fact Sheet for Healthcare Providers:  IncredibleEmployment.be  This test is no t yet approved or cleared by the [place] FDA and  has been authorized for detection and/or diagnosis of SARS-CoV-2 by FDA under an Emergency Use Authorization (EUA). This EUA will remain  in effect (meaning this test can be used) for the duration of the COVID-19 declaration under Section 564(b)(1) of the Act, 21 U.S.C.section  360bbb-3(b)(1), unless the authorization is terminated  or revoked sooner.       Influenza A by PCR NEGATIVE NEGATIVE Final   Influenza B by PCR NEGATIVE NEGATIVE Final    Comment: (NOTE) The Xpert Xpress SARS-CoV-2/FLU/RSV plus assay is intended as an aid in the diagnosis of influenza from Nasopharyngeal swab specimens and should not be used as a sole basis for treatment. Nasal washings and aspirates are unacceptable for Xpert Xpress SARS-CoV-2/FLU/RSV testing.  Fact Sheet for Patients: EntrepreneurPulse.com.au  Fact Sheet for Healthcare Providers: IncredibleEmployment.be  This test is not yet approved or cleared by the [place] FDA and has been authorized for detection and/or diagnosis of SARS-CoV-2 by FDA under an Emergency Use Authorization (EUA). This EUA will remain in effect (meaning this test can be used) for the duration of the COVID-19 declaration under Section 564(b)(1) of the Act, 21 U.S.C. section 360bbb-3(b)(1), unless the authorization is terminated or revoked.  Performed at [facility], [address] [place], [place] [address]   Urine culture     Status: Abnormal   Collection Time: 06/12/20 11:43 AM   Specimen: Urine, Clean Catch  Result Value Ref Range Status   Specimen Description   Final    URINE, CLEAN CATCH Performed at [facility], [address], [place], [place] [address]    Special Requests   Final    NONE Performed at [facility], [address] [place], [place] [address]    Culture >=100,000 COLONIES/mL ESCHERICHIA COLI (A)  Final   Report Status 06/15/2020 FINAL  Final   Organism ID, Bacteria ESCHERICHIA COLI (A)  Final      Susceptibility   Escherichia coli - MIC*    AMPICILLIN >=32 RESISTANT Resistant     CEFAZOLIN <=4 SENSITIVE Sensitive     CEFEPIME <=0.12 SENSITIVE Sensitive     CEFTRIAXONE <=0.25 SENSITIVE Sensitive     CIPROFLOXACIN >=4 RESISTANT Resistant     GENTAMICIN <=1 SENSITIVE  Sensitive  IMIPENEM <=0.25 SENSITIVE Sensitive     NITROFURANTOIN <=16 SENSITIVE Sensitive     TRIMETH/SULFA >=320 RESISTANT Resistant     AMPICILLIN/SULBACTAM 8 SENSITIVE Sensitive     PIP/TAZO <=4 SENSITIVE Sensitive     * >=100,000 COLONIES/mL ESCHERICHIA COLI  Blood Culture (routine x 2)     Status: None (Preliminary result)   Collection Time: 06/15/20  5:00 PM   Specimen: BLOOD  Result Value Ref Range Status   Specimen Description BLOOD LEFT ANTECUBITAL  Final   Special Requests   Final    BOTTLES DRAWN AEROBIC AND ANAEROBIC Blood Culture results may not be optimal due to an inadequate volume of blood received in culture bottles   Culture   Final    NO GROWTH 4 DAYS Performed at [facility], [address], [place], [place] [address]    Report Status PENDING  Incomplete  Urine culture     Status: Abnormal   Collection Time: 06/15/20  5:20 PM   Specimen: In/Out Cath Urine  Result Value Ref Range Status   Specimen Description   Final    IN/OUT CATH URINE Performed at [facility], [address] [place], [place] [address]    Special Requests   Final    NONE Performed at [facility], [address] [place], [place] [address]    Culture 30,000 COLONIES/mL ESCHERICHIA COLI (A)  Final   Report Status 06/18/2020 FINAL  Final   Organism ID, Bacteria ESCHERICHIA COLI (A)  Final      Susceptibility   Escherichia coli - MIC*    AMPICILLIN >=32 RESISTANT Resistant     CEFAZOLIN <=4 SENSITIVE Sensitive     CEFEPIME <=0.12 SENSITIVE Sensitive     CEFTRIAXONE <=0.25 SENSITIVE Sensitive     CIPROFLOXACIN >=4 RESISTANT Resistant     GENTAMICIN <=1 SENSITIVE Sensitive     IMIPENEM <=0.25 SENSITIVE Sensitive     NITROFURANTOIN <=16 SENSITIVE Sensitive     TRIMETH/SULFA >=320 RESISTANT Resistant     AMPICILLIN/SULBACTAM 16 INTERMEDIATE Intermediate     PIP/TAZO <=4 SENSITIVE Sensitive     * 30,000 COLONIES/mL ESCHERICHIA COLI  Resp Panel by RT-PCR (Flu A&B, Covid) Nasopharyngeal  Swab     Status: None   Collection Time: 06/15/20  5:20 PM   Specimen: Nasopharyngeal Swab; Nasopharyngeal(NP) swabs in vial transport medium  Result Value Ref Range Status   SARS Coronavirus 2 by RT PCR NEGATIVE NEGATIVE Final    Comment: (NOTE) SARS-CoV-2 target nucleic acids are NOT DETECTED.  The SARS-CoV-2 RNA is generally detectable in upper respiratory specimens during the acute phase of infection. The lowest concentration of SARS-CoV-2 viral copies this assay can detect is 138 copies/mL. A negative result does not preclude SARS-Cov-2 infection and should not be used as the sole basis for treatment or other patient management decisions. A negative result may occur with  improper specimen collection/handling, submission of specimen other than nasopharyngeal swab, presence of viral mutation(s) within the areas targeted by this assay, and inadequate number of viral copies(<138 copies/mL). A negative result must be combined with clinical observations, patient history, and epidemiological information. The expected result is Negative.  Fact Sheet for Patients:  EntrepreneurPulse.com.au  Fact Sheet for Healthcare Providers:  IncredibleEmployment.be  This test is no t yet approved or cleared by the [place] FDA and  has been authorized for detection and/or diagnosis of SARS-CoV-2 by FDA under an Emergency Use Authorization (EUA). This EUA will remain  in effect (meaning this test can be used)  for the duration of the COVID-19 declaration under Section 564(b)(1) of the Act, 21 U.S.C.section 360bbb-3(b)(1), unless the authorization is terminated  or revoked sooner.       Influenza A by PCR NEGATIVE NEGATIVE Final   Influenza B by PCR NEGATIVE NEGATIVE Final    Comment: (NOTE) The Xpert Xpress SARS-CoV-2/FLU/RSV plus assay is intended as an aid in the diagnosis of influenza from Nasopharyngeal swab specimens and should not be used as a sole  basis for treatment. Nasal washings and aspirates are unacceptable for Xpert Xpress SARS-CoV-2/FLU/RSV testing.  Fact Sheet for Patients: EntrepreneurPulse.com.au  Fact Sheet for Healthcare Providers: IncredibleEmployment.be  This test is not yet approved or cleared by the [place] FDA and has been authorized for detection and/or diagnosis of SARS-CoV-2 by FDA under an Emergency Use Authorization (EUA). This EUA will remain in effect (meaning this test can be used) for the duration of the COVID-19 declaration under Section 564(b)(1) of the Act, 21 U.S.C. section 360bbb-3(b)(1), unless the authorization is terminated or revoked.  Performed at [facility], [address], [place], [place] [address]   Blood Culture (routine x 2)     Status: None (Preliminary result)   Collection Time: 06/15/20  6:17 PM   Specimen: Right Antecubital; Blood  Result Value Ref Range Status   Specimen Description RIGHT ANTECUBITAL  Final   Special Requests   Final    BOTTLES DRAWN AEROBIC AND ANAEROBIC Blood Culture adequate volume   Culture   Final    NO GROWTH 4 DAYS Performed at [facility], [address] [place], [place] [address]    Report Status PENDING  Incomplete      Radiology Studies: No results found.  Scheduled Meds: . amLODipine  5 mg Oral Daily  . Chlorhexidine Gluconate Cloth  6 each Topical Daily  . cloNIDine  0.3 mg Oral QHS  . dexamethasone  1 mg Oral Q breakfast  . feeding supplement  237 mL Oral BID BM  . gabapentin  600 mg Oral TID  . levETIRAcetam  1,000 mg Oral BID  . metoprolol tartrate  12.5 mg Oral BID  . pravastatin  10 mg Oral QHS   Continuous Infusions: . azithromycin 500 mg (06/19/20 0522)  . cefTRIAXone (ROCEPHIN)  IV 1 g (06/19/20 0531)     LOS: 4 days   Time spent: 27 minutes   Darliss Cheney, MD [facility]  06/19/2020, 11:30 AM   To contact the attending provider between 7A-7P or the covering  provider during after hours 7P-7A, please log into the web site [email or web address].

## 2020-06-20 LAB — BASIC METABOLIC PANEL
Anion gap: 9 (ref 5–15)
BUN: 14 mg/dL (ref 8–23)
CO2: 27 mmol/L (ref 22–32)
Calcium: 8.2 mg/dL — ABNORMAL LOW (ref 8.9–10.3)
Chloride: 100 mmol/L (ref 98–111)
Creatinine, Ser: 0.62 mg/dL (ref 0.44–1.00)
GFR, Estimated: >60 mL/min (ref >60)
Glucose, Bld: 107 mg/dL — ABNORMAL HIGH (ref 70–99)
Potassium: 3.6 mmol/L (ref 3.5–5.1)
Sodium: 136 mmol/L (ref 135–145)

## 2020-06-20 LAB — CULTURE, BLOOD (ROUTINE X 2)
Culture: NO GROWTH
Culture: NO GROWTH
Special Requests: ADEQUATE

## 2020-06-20 LAB — CBC WITH DIFFERENTIAL/PLATELET
Abs Immature Granulocytes: 0.36 10*3/uL — ABNORMAL HIGH (ref 0.00–0.07)
Basophils Absolute: 0 10*3/uL (ref 0.0–0.1)
Basophils Relative: 0 %
Eosinophils Absolute: 0 10*3/uL (ref 0.0–0.5)
Eosinophils Relative: 0 %
HCT: 30.9 % — ABNORMAL LOW (ref 36.0–46.0)
Hemoglobin: 9.9 g/dL — ABNORMAL LOW (ref 12.0–15.0)
Immature Granulocytes: 5 %
Lymphocytes Relative: 9 %
Lymphs Abs: 0.7 10*3/uL (ref 0.7–4.0)
MCH: 30.8 pg (ref 26.0–34.0)
MCHC: 32 g/dL (ref 30.0–36.0)
MCV: 96.3 fL (ref 80.0–100.0)
Monocytes Absolute: 0.2 10*3/uL (ref 0.1–1.0)
Monocytes Relative: 3 %
Neutro Abs: 6.7 10*3/uL (ref 1.7–7.7)
Neutrophils Relative %: 83 %
Platelets: 180 10*3/uL (ref 150–400)
RBC: 3.21 MIL/uL — ABNORMAL LOW (ref 3.87–5.11)
RDW: 17.3 % — ABNORMAL HIGH (ref 11.5–15.5)
WBC: 8 10*3/uL (ref 4.0–10.5)
nRBC: 0.7 % — ABNORMAL HIGH (ref 0.0–0.2)

## 2020-06-20 NOTE — Progress Notes (Signed)
PROGRESS NOTE    Wanda Ross  [id number] DOB: October 31, 1950 DOA: 06/15/2020 PCP: Fayrene Helper, MD   Brief Narrative:  Wanda Ross is a 70 y.o. female with medical history significant for Glioblastoma ( followed by Dr. Mickeal Skinner), Seizures, Hypertension and hyperlipidemia who presented to ED via EMS due to shortness of breath and increased confusion.  Patient was seen by physical therapy at home and she was noted to be febrile with a temperature of 101F, she was also tachycardic (HR in 120s) and presented with increased shortness of breath, PulseOx was checked and was noted to be in low 80s, so EMS was activated and patient was sent to the ED for further evaluation and management.  Patient was seen in the ED on 1/15 and she was diagnosed to have UTI, she was treated with IV ceftriaxone in the ED and discharged home with Augmentin. She was admitted on 12/25 and discharged on 05/25/2020 due to facial herpes zoster involving the right eye and was treated with IV acyclovir until 12/28, when she was transitioned to Valtrex 100 mg 3 times daily for additional 5 days to complete course of therapy. She lives at home alone, but her children were always visiting her at home.  This time on 06/15/2020 , in the emergency department, she was febrile and tachycardic.  O2 sat was 95% on supplemental oxygen via [place] at 6 LPM.  Respiratory panel for influenza A, B and SARS coronavirus 2 was negative.  CT angiography chest with contrast was negative for acute pulmonary embolus or aortic dissection, but showed small right-sided pleural effusion and partial consolidation in the right greater than left lower lobes with additional small foci of consolidation and ground-glass density in the lingula, possible bilateral pneumonia. Chest x-ray showed likely early infiltrate in the right base CTA head without contrast showed no evidence of acute infarction or acute intracranial hemorrhage.  She was treated  with IV antibiotics, IV Ativan 1 mg x 1 was given, Tylenol was given and IV hydration was provided.  Hospitalist was asked to admit patient for further evaluation and management.  Patient was admitted due to severe sepsis secondary to community-acquired pneumonia and acute metabolic encephalopathy for the same reason.  She was started on Rocephin and Zithromax.  Patient's mental status improved.  She was evaluated by PT OT who recommended discharging to SNF.  Assessment & Plan:   Principal Problem:   Sepsis ([place]) Active Problems:   Hyperlipidemia   Essential hypertension   Hypoalbuminemia due to protein-calorie malnutrition ([place])   Glioblastoma ([place])   Acute respiratory failure with hypoxia (H[facility])   Seizures ([facility])   Post herpetic neuralgia   CAP (community acquired pneumonia)   Acute metabolic encephalopathy   Pleural effusion on right   Elevated d-dimer  Acute metabolic encephalopathy possibly secondary to acute respiratory failure with hypoxia due to severe sepsis sepsis secondary to presumed CAP, POA: Patient meets severe sepsis criteria based on fever, tachycardia, tachypnea and acute encephalopathy.   CT angiography of chest was suggestive of bilateral pneumonia, in lower lobes.  She is more alert and oriented today compared to yesterday.  She is able to answer all the questions about her name, date of birth, the fact that she is currently at [facility] and current month but she missed current year.  Denied any shortness of breath.  Procalcitonin unremarkable.  Urine antigen for Legionella and streptococci negative. COVID-negative.  All the cultures, urine streptococcal and Legionella is negative.  Patient is  improving.  She is alert and oriented.  No shortness of breath.  She is not hypoxic.  Continue current antibiotic, hopefully will finish course by tomorrow.  Of note, she was once again noted to have low-grade fever of 100.6 last night.  I will check CBC today.  Right-sided pleural  effusion CT angiography of chest showed right-sided pleural effusion: IR was consulted however she had an adequate amount for thoracentesis.  UTI  Patient was recently diagnosed with a UTI and started on Augmentin Urine culture done on 1/15 was positive for E. Coli Urinalysis unimpressive for UTI at this time Any subsequent UTI will be covered by IV ceftriaxone as indicated above for CAP POA  Elevated D-dimer D-dimer 6.06; CT angiography of chest was negative for pulmonary embolism or aortic dissection  Essential hypertension: Blood pressure controlled.  Continue current regimen.  Hyperlipidemia Continue pravastatin  History of glioblastoma Continue Decadron and Temodar  Seizures Continue Keppra  Hypoalbuminemia due to moderate protein calorie malnutrition Albumin 2.5, protein supplement reported  Postherpetic neuralgia Continue Neurontin  Generalized weakness: Assessed by PT OT, they recommend SNF.  Daughter wants her to go to SNF as well and not home.  She wants her to go to [facility] and per [facility], they are okay to take her but they do not have an open bed until Monday.   DVT prophylaxis: SCDs Start: 06/15/20 2043   Code Status: Full Code  Family Communication: Daughter present at bedside.  Status is: Inpatient  Remains inpatient appropriate because:Inpatient level of care appropriate due to severity of illness   Dispo: The patient is from: Home              Anticipated d/c is to: Home              Anticipated d/c date is: 2 days              Patient currently is not medically stable to d/c.        Estimated body mass index is 22.49 kg/m as calculated from the following:   Height as of this encounter: 5\' 8"  (1.727 m).   Weight as of this encounter: 67.1 kg.  Pressure Injury 05/14/20 Coccyx Medial Stage 3 -  Full thickness tissue loss. Subcutaneous fat may be visible but bone, tendon or muscle are NOT exposed. One larger stage 3 with smaller  stage threes around. (Active)  05/14/20 0700  Location: Coccyx  Location Orientation: Medial  Staging: Stage 3 -  Full thickness tissue loss. Subcutaneous fat may be visible but bone, tendon or muscle are NOT exposed.  Wound Description (Comments): One larger stage 3 with smaller stage threes around.  Present on Admission: Yes     Nutritional status:               Consultants:   None  Procedures:   None  Antimicrobials:  Anti-infectives (From admission, onward)   Start     Dose/Rate Route Frequency Ordered Stop   06/15/20 2000  azithromycin (ZITHROMAX) 500 mg in sodium chloride 0.9 % 250 mL IVPB        500 mg 250 mL/hr over 60 Minutes Intravenous Every 24 hours 06/15/20 1949     06/15/20 1700  cefTRIAXone (ROCEPHIN) 1 g in sodium chloride 0.9 % 100 mL IVPB        1 g 200 mL/hr over 30 Minutes Intravenous Every 24 hours 06/15/20 1647           Subjective: Patient  seen and examined.  Daughter at the bedside.  Patient much more alert compared to yesterday.  Fully oriented after a little bit of prompting.  No complaints.  Objective: Vitals:   06/19/20 1047 06/19/20 2037 06/20/20 0450 06/20/20 0852  BP: 134/74 (!) 144/75 99/60 (!) 166/89  Pulse: (!) 105 99 90 (!) 107  Resp:  18 19   Temp:  (!) 100.6 F (38.1 C) 98.6 F (37 C)   TempSrc:  Oral    SpO2: 91% 99% 99% 97%  Weight:      Height:        Intake/Output Summary (Last 24 hours) at 06/20/2020 1033 Last data filed at 06/20/2020 0500 Gross per 24 hour  Intake 240 ml  Output 4200 ml  Net -3960 ml   Filed Weights   06/15/20 1648  Weight: 67.1 kg    Examination:  General exam: Appears calm and comfortable  Respiratory system: Faint rhonchi bilaterally. Respiratory effort normal. Cardiovascular system: S1 & S2 heard, RRR. No JVD, murmurs, rubs, gallops or clicks. No pedal edema. Gastrointestinal system: Abdomen is nondistended, soft and nontender. No organomegaly or masses felt. Normal bowel  sounds heard. Central nervous system: Alert and oriented. No focal neurological deficits. Extremities: Symmetric 5 x 5 power. Skin: No rashes, lesions or ulcers.  Scab seen on the right anterior chest, right anterior upper lateral and posterior shoulder.  No open vesicles.   Data Reviewed: I have personally reviewed following labs and imaging studies  CBC: Recent Labs  Lab 06/15/20 1700 06/16/20 0348 06/17/20 0627 06/18/20 0631  WBC 7.1 6.9 6.1 6.6  NEUTROABS 5.9  --  5.3 5.7  HGB 10.5* 10.5* 9.1* 9.4*  HCT 33.0* 32.5* 27.7* 29.6*  MCV 96.5 94.2 95.2 95.8  PLT 157 136* 133* [id number]*   Basic Metabolic Panel: Recent Labs  Lab 06/15/20 1700 06/16/20 0348 06/17/20 0627 06/18/20 0631  NA 144 141 139 141  K 4.0 2.8* 3.4* 3.8  CL 108 103 102 103  CO2 27 26 28 31   GLUCOSE 98 97 94 126*  BUN 21 10 12 13   CREATININE 0.64 0.51 0.56 0.51  CALCIUM 8.4* 8.2* 8.1* 8.4*  MG  --  1.7  --   --   PHOS  --  2.6  --   --    GFR: Estimated Creatinine Clearance: 65.1 mL/min (by C-G formula based on SCr of 0.51 mg/dL). Liver Function Tests: Recent Labs  Lab 06/15/20 1700 06/16/20 0348  AST 28 30  ALT 37 37  ALKPHOS 69 68  BILITOT 0.7 0.7  PROT 6.7 6.3*  ALBUMIN 2.5* 2.4*   No results for input(s): LIPASE, AMYLASE in the last 168 hours. No results for input(s): AMMONIA in the last 168 hours. Coagulation Profile: Recent Labs  Lab 06/15/20 1817 06/16/20 0348  INR 1.2 1.3*   Cardiac Enzymes: No results for input(s): CKTOTAL, CKMB, CKMBINDEX, TROPONINI in the last 168 hours. BNP (last 3 results) No results for input(s): PROBNP in the last 8760 hours. HbA1C: No results for input(s): HGBA1C in the last 72 hours. CBG: Recent Labs  Lab 06/17/20 0747 06/17/20 1104  GLUCAP 77 151*   Lipid Profile: No results for input(s): CHOL, HDL, LDLCALC, TRIG, CHOLHDL, LDLDIRECT in the last 72 hours. Thyroid Function Tests: No results for input(s): TSH, T4TOTAL, FREET4, T3FREE, THYROIDAB  in the last 72 hours. Anemia Panel: No results for input(s): VITAMINB12, FOLATE, FERRITIN, TIBC, IRON, RETICCTPCT in the last 72 hours. Sepsis Labs: Recent Labs  Lab 06/15/20  1700 06/15/20 1817 06/15/20 1948  PROCALCITON  --  0.20  --   LATICACIDVEN 1.2  --  1.0    Recent Results (from the past 240 hour(s))  Resp Panel by RT-PCR (Flu A&B, Covid) Nasopharyngeal Swab     Status: None   Collection Time: 06/12/20  9:38 AM   Specimen: Nasopharyngeal Swab; Nasopharyngeal(NP) swabs in vial transport medium  Result Value Ref Range Status   SARS Coronavirus 2 by RT PCR NEGATIVE NEGATIVE Final    Comment: (NOTE) SARS-CoV-2 target nucleic acids are NOT DETECTED.  The SARS-CoV-2 RNA is generally detectable in upper respiratory specimens during the acute phase of infection. The lowest concentration of SARS-CoV-2 viral copies this assay can detect is 138 copies/mL. A negative result does not preclude SARS-Cov-2 infection and should not be used as the sole basis for treatment or other patient management decisions. A negative result may occur with  improper specimen collection/handling, submission of specimen other than nasopharyngeal swab, presence of viral mutation(s) within the areas targeted by this assay, and inadequate number of viral copies(<138 copies/mL). A negative result must be combined with clinical observations, patient history, and epidemiological information. The expected result is Negative.  Fact Sheet for Patients:  EntrepreneurPulse.com.au  Fact Sheet for Healthcare Providers:  IncredibleEmployment.be  This test is no t yet approved or cleared by the [place] FDA and  has been authorized for detection and/or diagnosis of SARS-CoV-2 by FDA under an Emergency Use Authorization (EUA). This EUA will remain  in effect (meaning this test can be used) for the duration of the COVID-19 declaration under Section 564(b)(1) of the Act,  21 U.S.C.section 360bbb-3(b)(1), unless the authorization is terminated  or revoked sooner.       Influenza A by PCR NEGATIVE NEGATIVE Final   Influenza B by PCR NEGATIVE NEGATIVE Final    Comment: (NOTE) The Xpert Xpress SARS-CoV-2/FLU/RSV plus assay is intended as an aid in the diagnosis of influenza from Nasopharyngeal swab specimens and should not be used as a sole basis for treatment. Nasal washings and aspirates are unacceptable for Xpert Xpress SARS-CoV-2/FLU/RSV testing.  Fact Sheet for Patients: EntrepreneurPulse.com.au  Fact Sheet for Healthcare Providers: IncredibleEmployment.be  This test is not yet approved or cleared by the [place] FDA and has been authorized for detection and/or diagnosis of SARS-CoV-2 by FDA under an Emergency Use Authorization (EUA). This EUA will remain in effect (meaning this test can be used) for the duration of the COVID-19 declaration under Section 564(b)(1) of the Act, 21 U.S.C. section 360bbb-3(b)(1), unless the authorization is terminated or revoked.  Performed at [facility], [address], [place], [place] [address]   Urine culture     Status: Abnormal   Collection Time: 06/12/20 11:43 AM   Specimen: Urine, Clean Catch  Result Value Ref Range Status   Specimen Description   Final    URINE, CLEAN CATCH Performed at [facility], [address] [place], [place] [address]    Special Requests   Final    NONE Performed at [facility] [address] [place], [place] [address]    Culture >=100,000 COLONIES/mL ESCHERICHIA COLI (A)  Final   Report Status 06/15/2020 FINAL  Final   Organism ID, Bacteria ESCHERICHIA COLI (A)  Final      Susceptibility   Escherichia coli - MIC*    AMPICILLIN >=32 RESISTANT Resistant     CEFAZOLIN <=4 SENSITIVE Sensitive     CEFEPIME <=0.12 SENSITIVE Sensitive     CEFTRIAXONE <=0.25  SENSITIVE Sensitive     CIPROFLOXACIN >=4 RESISTANT Resistant     GENTAMICIN  <=1 SENSITIVE Sensitive     IMIPENEM <=0.25 SENSITIVE Sensitive     NITROFURANTOIN <=16 SENSITIVE Sensitive     TRIMETH/SULFA >=320 RESISTANT Resistant     AMPICILLIN/SULBACTAM 8 SENSITIVE Sensitive     PIP/TAZO <=4 SENSITIVE Sensitive     * >=100,000 COLONIES/mL ESCHERICHIA COLI  Blood Culture (routine x 2)     Status: None (Preliminary result)   Collection Time: 06/15/20  5:00 PM   Specimen: BLOOD  Result Value Ref Range Status   Specimen Description BLOOD LEFT ANTECUBITAL  Final   Special Requests   Final    BOTTLES DRAWN AEROBIC AND ANAEROBIC Blood Culture results may not be optimal due to an inadequate volume of blood received in culture bottles   Culture   Final    NO GROWTH 4 DAYS Performed at [facility], [address] [place], [place] [address]    Report Status PENDING  Incomplete  Urine culture     Status: Abnormal   Collection Time: 06/15/20  5:20 PM   Specimen: In/Out Cath Urine  Result Value Ref Range Status   Specimen Description   Final    IN/OUT CATH URINE Performed at [facility], [address], [place], [place] [address]    Special Requests   Final    NONE Performed at [facility], [address], [place], [place] [address]    Culture 30,000 COLONIES/mL ESCHERICHIA COLI (A)  Final   Report Status 06/18/2020 FINAL  Final   Organism ID, Bacteria ESCHERICHIA COLI (A)  Final      Susceptibility   Escherichia coli - MIC*    AMPICILLIN >=32 RESISTANT Resistant     CEFAZOLIN <=4 SENSITIVE Sensitive     CEFEPIME <=0.12 SENSITIVE Sensitive     CEFTRIAXONE <=0.25 SENSITIVE Sensitive     CIPROFLOXACIN >=4 RESISTANT Resistant     GENTAMICIN <=1 SENSITIVE Sensitive     IMIPENEM <=0.25 SENSITIVE Sensitive     NITROFURANTOIN <=16 SENSITIVE Sensitive     TRIMETH/SULFA >=320 RESISTANT Resistant     AMPICILLIN/SULBACTAM 16 INTERMEDIATE Intermediate     PIP/TAZO <=4 SENSITIVE Sensitive     * 30,000 COLONIES/mL ESCHERICHIA COLI  Resp Panel by RT-PCR (Flu A&B, Covid)  Nasopharyngeal Swab     Status: None   Collection Time: 06/15/20  5:20 PM   Specimen: Nasopharyngeal Swab; Nasopharyngeal(NP) swabs in vial transport medium  Result Value Ref Range Status   SARS Coronavirus 2 by RT PCR NEGATIVE NEGATIVE Final    Comment: (NOTE) SARS-CoV-2 target nucleic acids are NOT DETECTED.  The SARS-CoV-2 RNA is generally detectable in upper respiratory specimens during the acute phase of infection. The lowest concentration of SARS-CoV-2 viral copies this assay can detect is 138 copies/mL. A negative result does not preclude SARS-Cov-2 infection and should not be used as the sole basis for treatment or other patient management decisions. A negative result may occur with  improper specimen collection/handling, submission of specimen other than nasopharyngeal swab, presence of viral mutation(s) within the areas targeted by this assay, and inadequate number of viral copies(<138 copies/mL). A negative result must be combined with clinical observations, patient history, and epidemiological information. The expected result is Negative.  Fact Sheet for Patients:  EntrepreneurPulse.com.au  Fact Sheet for Healthcare Providers:  IncredibleEmployment.be  This test is no t yet approved or cleared by the [place] FDA and  has been authorized for detection and/or diagnosis  of SARS-CoV-2 by FDA under an Emergency Use Authorization (EUA). This EUA will remain  in effect (meaning this test can be used) for the duration of the COVID-19 declaration under Section 564(b)(1) of the Act, 21 U.S.C.section 360bbb-3(b)(1), unless the authorization is terminated  or revoked sooner.       Influenza A by PCR NEGATIVE NEGATIVE Final   Influenza B by PCR NEGATIVE NEGATIVE Final    Comment: (NOTE) The Xpert Xpress SARS-CoV-2/FLU/RSV plus assay is intended as an aid in the diagnosis of influenza from Nasopharyngeal swab specimens and should not be  used as a sole basis for treatment. Nasal washings and aspirates are unacceptable for Xpert Xpress SARS-CoV-2/FLU/RSV testing.  Fact Sheet for Patients: EntrepreneurPulse.com.au  Fact Sheet for Healthcare Providers: IncredibleEmployment.be  This test is not yet approved or cleared by the [place] FDA and has been authorized for detection and/or diagnosis of SARS-CoV-2 by FDA under an Emergency Use Authorization (EUA). This EUA will remain in effect (meaning this test can be used) for the duration of the COVID-19 declaration under Section 564(b)(1) of the Act, 21 U.S.C. section 360bbb-3(b)(1), unless the authorization is terminated or revoked.  Performed at [facility], [address], [place], [place] [address]   Blood Culture (routine x 2)     Status: None (Preliminary result)   Collection Time: 06/15/20  6:17 PM   Specimen: Right Antecubital; Blood  Result Value Ref Range Status   Specimen Description RIGHT ANTECUBITAL  Final   Special Requests   Final    BOTTLES DRAWN AEROBIC AND ANAEROBIC Blood Culture adequate volume   Culture   Final    NO GROWTH 4 DAYS Performed at [facility], [address] [place], [place] [address]    Report Status PENDING  Incomplete      Radiology Studies: No results found.  Scheduled Meds: . amLODipine  5 mg Oral Daily  . Chlorhexidine Gluconate Cloth  6 each Topical Daily  . cloNIDine  0.3 mg Oral QHS  . dexamethasone  1 mg Oral Q breakfast  . feeding supplement  237 mL Oral BID BM  . gabapentin  600 mg Oral TID  . levETIRAcetam  1,000 mg Oral BID  . metoprolol tartrate  12.5 mg Oral BID  . pravastatin  10 mg Oral QHS   Continuous Infusions: . azithromycin 500 mg (06/20/20 0453)  . cefTRIAXone (ROCEPHIN)  IV 1 g (06/20/20 0622)     LOS: 5 days   Time spent: 28 minutes   Darliss Cheney, MD [facility]  06/20/2020, 10:33 AM   To contact the attending provider between 7A-7P or the  covering provider during after hours 7P-7A, please log into the web site [email or web address].

## 2020-06-21 DIAGNOSIS — A4189 Other specified sepsis: Secondary | ICD-10-CM | POA: Diagnosis present

## 2020-06-21 DIAGNOSIS — Z8249 Family history of ischemic heart disease and other diseases of the circulatory system: Secondary | ICD-10-CM | POA: Diagnosis not present

## 2020-06-21 DIAGNOSIS — J189 Pneumonia, unspecified organism: Secondary | ICD-10-CM | POA: Diagnosis not present

## 2020-06-21 DIAGNOSIS — J90 Pleural effusion, not elsewhere classified: Secondary | ICD-10-CM | POA: Diagnosis not present

## 2020-06-21 DIAGNOSIS — Z833 Family history of diabetes mellitus: Secondary | ICD-10-CM | POA: Diagnosis not present

## 2020-06-21 DIAGNOSIS — R4182 Altered mental status, unspecified: Secondary | ICD-10-CM | POA: Diagnosis not present

## 2020-06-21 DIAGNOSIS — R404 Transient alteration of awareness: Secondary | ICD-10-CM | POA: Diagnosis not present

## 2020-06-21 DIAGNOSIS — E042 Nontoxic multinodular goiter: Secondary | ICD-10-CM | POA: Diagnosis not present

## 2020-06-21 DIAGNOSIS — E7849 Other hyperlipidemia: Secondary | ICD-10-CM | POA: Diagnosis not present

## 2020-06-21 DIAGNOSIS — R464 Slowness and poor responsiveness: Secondary | ICD-10-CM | POA: Diagnosis not present

## 2020-06-21 DIAGNOSIS — C713 Malignant neoplasm of parietal lobe: Secondary | ICD-10-CM | POA: Diagnosis not present

## 2020-06-21 DIAGNOSIS — R569 Unspecified convulsions: Secondary | ICD-10-CM | POA: Diagnosis present

## 2020-06-21 DIAGNOSIS — I10 Essential (primary) hypertension: Secondary | ICD-10-CM | POA: Diagnosis present

## 2020-06-21 DIAGNOSIS — R531 Weakness: Secondary | ICD-10-CM | POA: Diagnosis not present

## 2020-06-21 DIAGNOSIS — Z820 Family history of epilepsy and other diseases of the nervous system: Secondary | ICD-10-CM | POA: Diagnosis not present

## 2020-06-21 DIAGNOSIS — R6520 Severe sepsis without septic shock: Secondary | ICD-10-CM | POA: Diagnosis not present

## 2020-06-21 DIAGNOSIS — U071 COVID-19: Secondary | ICD-10-CM | POA: Diagnosis present

## 2020-06-21 DIAGNOSIS — Z743 Need for continuous supervision: Secondary | ICD-10-CM | POA: Diagnosis not present

## 2020-06-21 DIAGNOSIS — C719 Malignant neoplasm of brain, unspecified: Secondary | ICD-10-CM | POA: Diagnosis present

## 2020-06-21 DIAGNOSIS — Z66 Do not resuscitate: Secondary | ICD-10-CM | POA: Diagnosis not present

## 2020-06-21 DIAGNOSIS — J1282 Pneumonia due to coronavirus disease 2019: Secondary | ICD-10-CM | POA: Diagnosis present

## 2020-06-21 DIAGNOSIS — R41841 Cognitive communication deficit: Secondary | ICD-10-CM | POA: Diagnosis not present

## 2020-06-21 DIAGNOSIS — H748X1 Other specified disorders of right middle ear and mastoid: Secondary | ICD-10-CM | POA: Diagnosis not present

## 2020-06-21 DIAGNOSIS — G9389 Other specified disorders of brain: Secondary | ICD-10-CM | POA: Diagnosis not present

## 2020-06-21 DIAGNOSIS — R279 Unspecified lack of coordination: Secondary | ICD-10-CM | POA: Diagnosis not present

## 2020-06-21 DIAGNOSIS — Z79899 Other long term (current) drug therapy: Secondary | ICD-10-CM | POA: Diagnosis not present

## 2020-06-21 DIAGNOSIS — B0230 Zoster ocular disease, unspecified: Secondary | ICD-10-CM | POA: Diagnosis not present

## 2020-06-21 DIAGNOSIS — R7303 Prediabetes: Secondary | ICD-10-CM | POA: Diagnosis present

## 2020-06-21 DIAGNOSIS — Z803 Family history of malignant neoplasm of breast: Secondary | ICD-10-CM | POA: Diagnosis not present

## 2020-06-21 DIAGNOSIS — R0902 Hypoxemia: Secondary | ICD-10-CM | POA: Diagnosis not present

## 2020-06-21 DIAGNOSIS — Z7983 Long term (current) use of bisphosphonates: Secondary | ICD-10-CM | POA: Diagnosis not present

## 2020-06-21 DIAGNOSIS — G8191 Hemiplegia, unspecified affecting right dominant side: Secondary | ICD-10-CM | POA: Diagnosis not present

## 2020-06-21 DIAGNOSIS — Z515 Encounter for palliative care: Secondary | ICD-10-CM | POA: Diagnosis not present

## 2020-06-21 DIAGNOSIS — B028 Zoster with other complications: Secondary | ICD-10-CM | POA: Diagnosis not present

## 2020-06-21 DIAGNOSIS — R2689 Other abnormalities of gait and mobility: Secondary | ICD-10-CM | POA: Diagnosis not present

## 2020-06-21 DIAGNOSIS — M6281 Muscle weakness (generalized): Secondary | ICD-10-CM | POA: Diagnosis not present

## 2020-06-21 DIAGNOSIS — G40909 Epilepsy, unspecified, not intractable, without status epilepticus: Secondary | ICD-10-CM | POA: Diagnosis not present

## 2020-06-21 DIAGNOSIS — R627 Adult failure to thrive: Secondary | ICD-10-CM | POA: Diagnosis present

## 2020-06-21 DIAGNOSIS — G9340 Encephalopathy, unspecified: Secondary | ICD-10-CM | POA: Diagnosis not present

## 2020-06-21 DIAGNOSIS — G9341 Metabolic encephalopathy: Secondary | ICD-10-CM | POA: Diagnosis present

## 2020-06-21 DIAGNOSIS — R7401 Elevation of levels of liver transaminase levels: Secondary | ICD-10-CM | POA: Diagnosis not present

## 2020-06-21 DIAGNOSIS — D696 Thrombocytopenia, unspecified: Secondary | ICD-10-CM | POA: Diagnosis not present

## 2020-06-21 DIAGNOSIS — E785 Hyperlipidemia, unspecified: Secondary | ICD-10-CM | POA: Diagnosis present

## 2020-06-21 DIAGNOSIS — B029 Zoster without complications: Secondary | ICD-10-CM | POA: Diagnosis present

## 2020-06-21 DIAGNOSIS — H409 Unspecified glaucoma: Secondary | ICD-10-CM | POA: Diagnosis present

## 2020-06-21 DIAGNOSIS — R739 Hyperglycemia, unspecified: Secondary | ICD-10-CM | POA: Diagnosis not present

## 2020-06-21 DIAGNOSIS — A419 Sepsis, unspecified organism: Secondary | ICD-10-CM | POA: Diagnosis not present

## 2020-06-21 LAB — SARS CORONAVIRUS 2 BY RT PCR (HOSPITAL ORDER, PERFORMED IN ~~LOC~~ HOSPITAL LAB): SARS Coronavirus 2: NEGATIVE

## 2020-06-21 MED ORDER — METOPROLOL TARTRATE 25 MG PO TABS: 12.5000 mg | ORAL_TABLET | Freq: Two times a day (BID) | ORAL | 0 refills | Status: DC

## 2020-06-21 NOTE — Progress Notes (Signed)
Manufacturing engineer [facility]) Community Based Palliative Care       This patient is enrolled in our palliative care services in the community.  ACC will continue to follow for any discharge planning needs and to coordinate continuation of palliative care.    Thank you for the opportunity to participate in this patient's care.     Domenic Moras, BSN, RN [facility] Liaison   [phone]

## 2020-06-21 NOTE — Progress Notes (Signed)
Foley cath removed at 0900. Pt ambulated to chair with standby assist x1, gait is unsteady. Pt has now been up in chair since that time, fed self two bowls of cereal and drank 2 cups of juice and a carton of milk. Pt just assisted to bedside commode but unable to void at this time, pt states, "I just don't feel like I need to go honey." Pt back to bed with one assist.

## 2020-06-21 NOTE — TOC Transition Note (Signed)
Transition of Care [facility]) - CM/SW Discharge Note   Patient Details  Name: Wanda Ross MRN: [id number] Date of Birth: 11-05-1950  Transition of Care [facility]) CM/SW Contact:  Salome Arnt, LCSW Phone Number: 06/21/2020, 10:07 AM   Clinical Narrative:  Pt to d/c to [facility] today. COVID test ordered- awaiting results. D/C clinicals sent to facility. Auth received- [id number], good for 3 days starting today with next review 1/26. Mardene Celeste at [facility]. RN given number to call report. LCSW spoke with pt's daughter, [name] who is agreeable to transfer and requests EMS transport. LCSW will arrange once COVID test results received.        Final next level of care: Skilled Nursing Facility Barriers to Discharge: Barriers Resolved   Patient Goals and CMS Choice   CMS Medicare.gov Compare Post Acute Care list provided to:: Patient Represenative (must comment) ([name] Leclere (daughter)) Choice offered to / list presented to : Adult Children  Discharge Placement   Existing PASRR number confirmed : 06/21/20          Patient chooses bed at: Other - please specify in the comment section below: ([facility]) Patient to be transferred to facility by: [facility] Name of family member notified: [name]- daughter Patient and family notified of of transfer: 06/21/20  Discharge Plan and Services In-house Referral: Clinical Social Work Discharge Planning Services: NA Post Acute Care Choice: Home Health          DME Arranged: N/A DME Agency: NA       HH Arranged: RN,PT,Nurse's Aide [place]: [place]        Social Determinants of Health (SDOH) Interventions     Readmission Risk Interventions Readmission Risk Prevention Plan 06/16/2020 05/24/2020  Transportation Screening Complete Complete  PCP or Specialist Appt within 3-5 Days - Complete  HRI or [place] - Complete  Social Work Consult for [place] Planning/Counseling -  Complete  Palliative Care Screening - Complete  Medication Review Press photographer) Complete Complete  HRI or Home Care Consult Complete -  SW Recovery Care/Counseling Consult Complete -  Palliative Care Screening Not Applicable -  Some recent data might be hidden

## 2020-06-21 NOTE — Clinical Social Work Note (Signed)
EMS scheduled. CSW updated daughter. TOC signing off.

## 2020-06-21 NOTE — Progress Notes (Signed)
Pt discharged via stretcher to [facility] by [facility]. Pt wearing clothing, shoes and socks sent in bag with EMS staff. Pt report called to Madelin Headings, LPN. Pt's daughter [name] called and notified of pt transport.

## 2020-06-21 NOTE — Progress Notes (Signed)
Notified by housekeeping staff that pt has bleeding noted from her right ear. Upon exam, pt has moderate amount of bright red bleeding from site in right upper auricle of ear. Pt seems to have picked/pulled scab off of previous rash site. Ear cleaned, vaseline gauze applied and secured with tape. Pt assisted OOB to [place], but pt did not void. Pt moved to recliner awaiting lunch tray. Daughter at beside now, giving pt water to drink.

## 2020-06-21 NOTE — Discharge Summary (Addendum)
Physician Discharge Summary  Wanda Ross [id number] DOB: May 21, 1951 DOA: 06/15/2020  PCP: Fayrene Helper, MD  Admit date: 06/15/2020 Discharge date: 06/21/2020 30 Day Unplanned Readmission Risk Score   Flowsheet Row ED to Hosp-Admission (Current) from 06/15/2020 in [facility]  30 Day Unplanned Readmission Risk Score (%) 32.32 Filed at 06/21/2020 0801     This score is the patient's risk of an unplanned readmission within 30 days of being discharged (0 -100%). The score is based on dignosis, age, lab data, medications, orders, and past utilization.   Low:  0-14.9   Medium: 15-21.9   High: 22-29.9   Extreme: 30 and above         Admitted From: Home Disposition: SNF  Recommendations for Outpatient Follow-up:  1. Follow up with PCP in 1-2 weeks 2. Please obtain BMP/CBC in one week 3. Please follow up with your PCP on the following pending results: Unresulted Labs (From admission, onward)          Start     Ordered   06/21/20 0842  SARS Coronavirus 2 by RT PCR (hospital order, performed in [place] hospital lab) Nasopharyngeal Nasopharyngeal Swab  (Tier 2 - Symptomatic/asymptomatic with Precautions )  Once,   R       Question Answer Comment  Is this test for diagnosis or screening Screening   Symptomatic for COVID-19 as defined by CDC No   Hospitalized for COVID-19 No   Admitted to ICU for COVID-19 No   Previously tested for COVID-19 Yes   Resident in a congregate (group) care setting No   Employed in healthcare setting No   Pregnant No   Has patient completed COVID vaccination(s) (2 doses of Pfizer/Moderna 1 dose of [employer]) Unknown      06/21/20 0842   06/16/20 0900  Body fluid culture (includes gram stain)  (Thoracentesis Labs Panel)  Once,   STAT       Question:  Are there also cytology or pathology orders on this specimen?  Answer:  Yes   06/15/20 2206   06/15/20 2204  Body fluid cell count with differential  (Thoracentesis  Labs Panel)  Once,   STAT       Question:  Are there also cytology or pathology orders on this specimen?  Answer:  Yes   06/15/20 2206   06/15/20 2204  Lactate dehydrogenase (pleural or peritoneal fluid)  (Thoracentesis Labs Panel)  Once,   STAT        06/15/20 2206   06/15/20 2204  Protein, pleural or peritoneal fluid  (Thoracentesis Labs Panel)  Once,   STAT        06/15/20 2206            Home Health: None Equipment/Devices: None  Discharge Condition: Stable CODE STATUS: Full code Diet recommendation: Cardiac  Subjective: Seen and examined.  She has no complaints.  Brief/Interim Summary: Wanda Westervelt Simpsonis a 70 y.o.femalewith medical history significant forGlioblastoma( followed by Dr. Mickeal Skinner), Seizures, Hypertension andhyperlipidemia who presented to ED via EMS due toshortness of breath and increased confusion. Patient was seen by physical therapy at home and she was noted to be febrile with a temperature of 101F, she was also tachycardic (HR in 120s) and presented with increased shortness of breath,PulseOxwas checked and was noted to be in low 80s, so EMS was activated and patient was sent to the ED for further evaluation and management.  Patient was seen in the ED on 1/15 and  she was diagnosed to have UTI, she was treated with IV ceftriaxone in the ED and discharged home with Augmentin. She was admitted on 12/25and discharged on 05/25/2020 due to facial herpes zoster involving the right eye and was treated with IV acyclovir until 12/28, when she was transitioned to Valtrex 100 mg 3 times daily for additional 5 days to complete course of therapy. She lives at home alone, but her children were always visiting her at home.  This time on 06/15/2020 , in the emergency department,she was febrileandtachycardic.O2 sat was 95% on supplemental oxygen via [place] at 6 LPM. Respiratory panel for influenza A, B and SARS coronavirus 2 was negative.  CT angiography chest with  contrast was negative for acute pulmonary embolus or aortic dissection, but showed small right-sided pleural effusion andpartial consolidation in the right greater than left lower lobes with additional small foci of consolidation and ground-glass density in the lingula, possible bilateral pneumonia. Chest x-ray showed likely early infiltrate in the right base CTA head without contrast showed no evidence of acute infarction or acute intracranial hemorrhage.  She was treated with IV antibiotics, IV Ativan 1 mg x 1 was given, Tylenol was given and IV hydration was provided. Hospitalist was asked to admit patient for further evaluation and management.  Patient was admitted due to severe sepsis secondary to community-acquired pneumonia and acute metabolic/toxic encephalopathy for the same reason.  She was started on Rocephin and Zithromax.  Patient's mental status improved.  She was evaluated by PT OT who recommended discharging to SNF which has been arranged for her so she is going to be discharged in stable condition.  She completed 5 days of Rocephin and Zithromax and she has been off of oxygen for last 3 days.  Of note, it has been noted that patient has slight confusion especially early morning when she wakes up from sleep but as the day goes on, she becomes completely alert and oriented.  Even when she is slightly confused, she is able to answer questions regarding orientation when provided with a little hint/prompts.  Discharge Diagnoses:  Principal Problem:   Sepsis ([place]) Active Problems:   Hyperlipidemia   Essential hypertension   Hypoalbuminemia due to protein-calorie malnutrition ([place])   Glioblastoma ([place])   Acute respiratory failure with hypoxia ([facility])   Seizures ([facility])   Post herpetic neuralgia   CAP (community acquired pneumonia)   Acute metabolic encephalopathy   Pleural effusion on right   Elevated d-dimer    Discharge Instructions   Allergies as of 06/21/2020   No Known  Allergies     Medication List    STOP taking these medications   amoxicillin-clavulanate 875-125 MG tablet Commonly known as: Augmentin     TAKE these medications   acetaminophen 325 MG tablet Commonly known as: TYLENOL Take 2 tablets (650 mg total) by mouth every 4 (four) hours as needed for mild pain (temp > 100.5).   alendronate 70 MG tablet Commonly known as: FOSAMAX TAKE 1 TABLET (70 MG TOTAL) BY MOUTH EVERY 7 (SEVEN) DAYS. TAKE WITH A FULL GLASS OF WATER ON AN EMPTY STOMACH. What changed: See the new instructions.   amLODipine 5 MG tablet Commonly known as: NORVASC Take 1 tablet (5 mg total) by mouth daily.   Calcium 1200 1200-1000 MG-UNIT Chew Chew 1,200 mg by mouth daily.   Centrum Silver Ultra Womens Tabs Take 1 tablet by mouth daily.   cloNIDine 0.3 MG tablet Commonly known as: CATAPRES TAKE 1 TABLET BY MOUTH EVERYDAY  AT BEDTIME What changed:   how much to take  how to take this  when to take this  additional instructions   Cyanocobalamin 2500 MCG Tabs Take 2,500 mcg by mouth daily. What changed: when to take this   Denta 5000 Plus 1.1 % Crea dental cream Generic drug: sodium fluoride Take 1 application by mouth daily.   dexamethasone 1 MG tablet Commonly known as: DECADRON Take 1 tablet (1 mg total) by mouth daily.   gabapentin 300 MG capsule Commonly known as: NEURONTIN Take 2 capsules (600 mg total) by mouth 3 (three) times daily.   levETIRAcetam 1000 MG tablet Commonly known as: KEPPRA Take 1 tablet (1,000 mg total) by mouth 2 (two) times daily.   lidocaine 5 % ointment Commonly known as: XYLOCAINE Apply 1 application topically as needed.   metoprolol tartrate 25 MG tablet Commonly known as: LOPRESSOR Take 0.5 tablets (12.5 mg total) by mouth 2 (two) times daily.   ondansetron 8 MG tablet Commonly known as: Zofran Take 1 tablet (8 mg total) by mouth 2 (two) times daily as needed (nausea and vomiting). May take 30-60 minutes prior  to Temodar administration if nausea/vomiting occurs.   potassium chloride 10 MEQ tablet Commonly known as: KLOR-CON Take 1 tablet (10 mEq total) by mouth daily.   pravastatin 10 MG tablet Commonly known as: PRAVACHOL Take 1 tablet (10 mg total) by mouth at bedtime.   temozolomide 180 MG capsule Commonly known as: TEMODAR Take 2 capsules (360 mg total) by mouth daily. May take on an empty stomach to decrease nausea & vomiting.   UNABLE TO FIND Med Name: HB Otic Drops (hydrocortisone 1%,benzocaine 2%) Sig: Fill ear canal with 3-4 drops up to 3 to 4 times daily #15 mL       Contact information for follow-up providers    Fayrene Helper, MD Follow up in 1 week(s).   Specialty: Family Medicine Contact information: [address] [place] [address] [phone]            Contact information for after-discharge care    Destination    [facility] .   Service: Skilled Nursing Contact information: [address] [place] [phone]                 No Known Allergies  Consultations: None   Procedures/Studies: CT Head Wo Contrast  Result Date: 06/15/2020 CLINICAL DATA:  Delirium. Additional history provided: Patient with shortness of breath, febrile, confusion, recently diagnosed with UTI. Additional history obtained from Rebecca NUMBERHistory of glioblastoma multiform a and seizures. EXAM: CT HEAD WITHOUT CONTRAST TECHNIQUE: Contiguous axial images were obtained from the base of the skull through the vertex without intravenous contrast. COMPARISON:  Prior brain MRI examinations 05/14/2020 and earlier. Prior head CT examinations 05/14/2020 and earlier. FINDINGS: Brain: Redemonstrated resection cavity within the left parietal lobe and callosal splenium. As before, there is white matter hypoattenuation surrounding the resection cavity, within the callosal  splenium and within the posterior cerebral hemispheres bilaterally. This corresponds with the T2/FLAIR hyperintense signal abnormality on the prior brain MRI of 05/14/2020. There is a subtly hyperdense 10 mm nodular focus just to the right of midline along the callosal splenium. This corresponds with the enhancing nodule demonstrated on the prior MRI. Mild ill-defined hypoattenuation elsewhere within the cerebral white matter is nonspecific, but compatible with chronic small vessel ischemic disease. There is no evidence of acute  intracranial hemorrhage. No acute demarcated cortical infarct. No extra-axial fluid collection. No midline shift. Vascular: No hyperdense vessel. Skull: Prior left parietal craniotomy/cranioplasty. No calvarial fracture. Sinuses/Orbits: Visualized orbits show no acute finding. Air-fluid level within the right sphenoid sinus. Mild bilateral ethmoid and left sphenoid sinus mucosal thickening. Other: Right mastoid effusion. IMPRESSION: No evidence of acute infarction or acute intracranial hemorrhage. Redemonstrated surgical cavity within the left parietal lobe and callosal splenium at site of prior brain tumor resection. There is nonspecific matter hypoattenuation surrounding the resection cavity, within the callosal splenium and within the posterior cerebral hemispheres bilaterally. This corresponds with the T2/FLAIR hyperintense signal abnormality at these sites on the prior brain MRI of 05/14/2020. A 10 mm subtly hyperdense focus is present just to the right of midline along the callosal splenium. This likely reflects the nodular enhancing focus demonstrated on the prior MRI and this is suspicious for residual viable tumor. Stable background mild cerebral white matter chronic small vessel ischemic disease. Paranasal sinus disease as described. Correlate for acute sinusitis. Right mastoid effusion. Electronically Signed   By: Kellie Simmering DO   On: 06/15/2020 19:55   CT Angio Chest PE W/Cm  &/Or Wo Cm  Result Date: 06/15/2020 CLINICAL DATA:  Shortness of breath and febrile EXAM: CT ANGIOGRAPHY CHEST WITH CONTRAST TECHNIQUE: Multidetector CT imaging of the chest was performed using the standard protocol during bolus administration of intravenous contrast. Multiplanar CT image reconstructions and MIPs were obtained to evaluate the vascular anatomy. CONTRAST:  151mL OMNIPAQUE IOHEXOL 350 MG/ML SOLN COMPARISON:  Chest x-ray 06/15/2020, CT chest 11/23/2019 FINDINGS: Cardiovascular: Satisfactory opacification of the pulmonary arteries to the segmental level. No evidence of pulmonary embolism. Nonaneurysmal aorta. No dissection is seen. Normal cardiac size. No significant pericardial effusion. Mediastinum/Nodes: 1.1 cm hypodense nodule in the right lobe of the thyroid, no further workup recommended based on size of lesion and age of patient. Midline trachea. No suspicious adenopathy. Esophagus within normal limits Lungs/Pleura: Small right-sided pleural effusion. Right medial apical ovoid hypodensity measuring 2.4 cm is of fluid density and may represent a small amount of loculated fluid at the right apex. Hazy bilateral lung densities. Partial consolidation in the right greater than left lower lobes. Small foci of consolidation and ground-glass density in the lingula. No pneumothorax. Upper Abdomen: Stable small hyperenhancing focus in the left hepatic lobe. No acute abnormality. Musculoskeletal: Degenerative changes of the spine. No acute or suspicious osseous abnormality. Review of the MIP images confirms the above findings. IMPRESSION: 1. Negative for acute pulmonary embolus or aortic dissection. 2. Small right-sided pleural effusion. Partial consolidation in the right greater than left lower lobes with additional small foci of consolidation and ground-glass density in the lingula, possible bilateral pneumonia. 3. 2.4 cm right medial apical hypodensity of fluid attenuation, possibly representing small  loculated apical fluid. Attention on follow-up imaging. Electronically Signed   By: Donavan Foil M.D.   On: 06/15/2020 20:00   [place] CHEST (PLEURAL EFFUSION)  Result Date: 06/16/2020 CLINICAL DATA:  RIGHT pleural effusion, question sufficient for thoracentesis EXAM: CHEST ULTRASOUND COMPARISON:  CT chest 06/15/2020 FINDINGS: Minimal RIGHT pleural effusion is identified. Amount of pleural fluid is insufficient for thoracentesis. IMPRESSION: Insufficient RIGHT pleural effusion for thoracentesis. Electronically Signed   By: Lavonia Dana M.D.   On: 06/16/2020 10:48   DG Chest Port 1 View  Result Date: 06/15/2020 CLINICAL DATA:  Shortness of breath and possible sepsis EXAM: PORTABLE CHEST 1 VIEW COMPARISON:  06/12/2020 FINDINGS: Cardiac shadow is stable. The lungs  are well aerated bilaterally. New right basilar airspace opacity is noted consistent with atelectasis or early infiltrate. No other focal airspace opacity is noted. No bony abnormality is seen. IMPRESSION: Likely early infiltrate in the right base. Electronically Signed   By: Inez Catalina M.D.   On: 06/15/2020 18:49   DG Chest Port 1 View  Result Date: 06/12/2020 CLINICAL DATA:  PATIENT WAS BROUGHT TO ER FOR CHEST DISCOMFORT AND SOME BACK PAIN BACK. PATIENT IS GETTING OVER SHINGLES. HAS SOME AMS DUE TO BRAIN TUMOR REMOVAL. HX: NEVER SMOKED, HTN. EXAM: PORTABLE CHEST 1 VIEW COMPARISON:  05/15/2020 FINDINGS: Cardiac silhouette is normal in size. No mediastinal or hilar masses or evidence of adenopathy. Linear opacity at the left lung base, similar to the prior study, consistent with atelectasis. Prominent lung markings. No evidence of pneumonia or pulmonary edema. No convincing pleural effusion and no pneumothorax. Skeletal structures are grossly intact. IMPRESSION: No active disease. Electronically Signed   By: Lajean Manes M.D.   On: 06/12/2020 10:09      Discharge Exam: Vitals:   06/20/20 2017 06/21/20 0459  BP: 135/70 111/66  Pulse: (!) 108  96  Resp: 19 15  Temp: 98.1 F (36.7 C) 98.7 F (37.1 C)  SpO2: 92% 94%   Vitals:   06/20/20 0450 06/20/20 0852 06/20/20 2017 06/21/20 0459  BP: 99/60 (!) 166/89 135/70 111/66  Pulse: 90 (!) 107 (!) 108 96  Resp: 19  19 15   Temp: 98.6 F (37 C)  98.1 F (36.7 C) 98.7 F (37.1 C)  TempSrc:      SpO2: 99% 97% 92% 94%  Weight:      Height:        General: Pt is alert, awake, not in acute distress Cardiovascular: RRR, S1/S2 +, no rubs, no gallops Respiratory: CTA bilaterally, no wheezing, no rhonchi Abdominal: Soft, NT, ND, bowel sounds + Extremities: no edema, no cyanosis    The results of significant diagnostics from this hospitalization (including imaging, microbiology, ancillary and laboratory) are listed below for reference.     Microbiology: Recent Results (from the past 240 hour(s))  Resp Panel by RT-PCR (Flu A&B, Covid) Nasopharyngeal Swab     Status: None   Collection Time: 06/12/20  9:38 AM   Specimen: Nasopharyngeal Swab; Nasopharyngeal(NP) swabs in vial transport medium  Result Value Ref Range Status   SARS Coronavirus 2 by RT PCR NEGATIVE NEGATIVE Final    Comment: (NOTE) SARS-CoV-2 target nucleic acids are NOT DETECTED.  The SARS-CoV-2 RNA is generally detectable in upper respiratory specimens during the acute phase of infection. The lowest concentration of SARS-CoV-2 viral copies this assay can detect is 138 copies/mL. A negative result does not preclude SARS-Cov-2 infection and should not be used as the sole basis for treatment or other patient management decisions. A negative result may occur with  improper specimen collection/handling, submission of specimen other than nasopharyngeal swab, presence of viral mutation(s) within the areas targeted by this assay, and inadequate number of viral copies(<138 copies/mL). A negative result must be combined with clinical observations, patient history, and epidemiological information. The expected result is  Negative.  Fact Sheet for Patients:  EntrepreneurPulse.com.au  Fact Sheet for Healthcare Providers:  IncredibleEmployment.be  This test is no t yet approved or cleared by the [place] FDA and  has been authorized for detection and/or diagnosis of SARS-CoV-2 by FDA under an Emergency Use Authorization (EUA). This EUA will remain  in effect (meaning this test can be used) for the  duration of the COVID-19 declaration under Section 564(b)(1) of the Act, 21 U.S.C.section 360bbb-3(b)(1), unless the authorization is terminated  or revoked sooner.       Influenza A by PCR NEGATIVE NEGATIVE Final   Influenza B by PCR NEGATIVE NEGATIVE Final    Comment: (NOTE) The Xpert Xpress SARS-CoV-2/FLU/RSV plus assay is intended as an aid in the diagnosis of influenza from Nasopharyngeal swab specimens and should not be used as a sole basis for treatment. Nasal washings and aspirates are unacceptable for Xpert Xpress SARS-CoV-2/FLU/RSV testing.  Fact Sheet for Patients: EntrepreneurPulse.com.au  Fact Sheet for Healthcare Providers: IncredibleEmployment.be  This test is not yet approved or cleared by the [place] FDA and has been authorized for detection and/or diagnosis of SARS-CoV-2 by FDA under an Emergency Use Authorization (EUA). This EUA will remain in effect (meaning this test can be used) for the duration of the COVID-19 declaration under Section 564(b)(1) of the Act, 21 U.S.C. section 360bbb-3(b)(1), unless the authorization is terminated or revoked.  Performed at [facility], [address], [place], [place] [address]   Urine culture     Status: Abnormal   Collection Time: 06/12/20 11:43 AM   Specimen: Urine, Clean Catch  Result Value Ref Range Status   Specimen Description   Final    URINE, CLEAN CATCH Performed at [facility], [address], [place], [place] [address]    Special Requests   Final     NONE Performed at [facility], [address], [place], [place] [address]    Culture >=100,000 COLONIES/mL ESCHERICHIA COLI (A)  Final   Report Status 06/15/2020 FINAL  Final   Organism ID, Bacteria ESCHERICHIA COLI (A)  Final      Susceptibility   Escherichia coli - MIC*    AMPICILLIN >=32 RESISTANT Resistant     CEFAZOLIN <=4 SENSITIVE Sensitive     CEFEPIME <=0.12 SENSITIVE Sensitive     CEFTRIAXONE <=0.25 SENSITIVE Sensitive     CIPROFLOXACIN >=4 RESISTANT Resistant     GENTAMICIN <=1 SENSITIVE Sensitive     IMIPENEM <=0.25 SENSITIVE Sensitive     NITROFURANTOIN <=16 SENSITIVE Sensitive     TRIMETH/SULFA >=320 RESISTANT Resistant     AMPICILLIN/SULBACTAM 8 SENSITIVE Sensitive     PIP/TAZO <=4 SENSITIVE Sensitive     * >=100,000 COLONIES/mL ESCHERICHIA COLI  Blood Culture (routine x 2)     Status: None   Collection Time: 06/15/20  5:00 PM   Specimen: BLOOD  Result Value Ref Range Status   Specimen Description BLOOD LEFT ANTECUBITAL  Final   Special Requests   Final    BOTTLES DRAWN AEROBIC AND ANAEROBIC Blood Culture results may not be optimal due to an inadequate volume of blood received in culture bottles   Culture   Final    NO GROWTH 5 DAYS Performed at [facility], [address] [place], [place] [address]    Report Status 06/20/2020 FINAL  Final  Urine culture     Status: Abnormal   Collection Time: 06/15/20  5:20 PM   Specimen: In/Out Cath Urine  Result Value Ref Range Status   Specimen Description   Final    IN/OUT CATH URINE Performed at [facility], [address] [place], [place] [address]    Special Requests   Final    NONE Performed at [facility], [address] [place],  [address]    Culture 30,000 COLONIES/mL ESCHERICHIA COLI (A)  Final   Report Status 06/18/2020 FINAL  Final  Organism ID, Bacteria ESCHERICHIA COLI (A)  Final      Susceptibility   Escherichia coli - MIC*    AMPICILLIN >=32 RESISTANT Resistant     CEFAZOLIN <=4  SENSITIVE Sensitive     CEFEPIME <=0.12 SENSITIVE Sensitive     CEFTRIAXONE <=0.25 SENSITIVE Sensitive     CIPROFLOXACIN >=4 RESISTANT Resistant     GENTAMICIN <=1 SENSITIVE Sensitive     IMIPENEM <=0.25 SENSITIVE Sensitive     NITROFURANTOIN <=16 SENSITIVE Sensitive     TRIMETH/SULFA >=320 RESISTANT Resistant     AMPICILLIN/SULBACTAM 16 INTERMEDIATE Intermediate     PIP/TAZO <=4 SENSITIVE Sensitive     * 30,000 COLONIES/mL ESCHERICHIA COLI  Resp Panel by RT-PCR (Flu A&B, Covid) Nasopharyngeal Swab     Status: None   Collection Time: 06/15/20  5:20 PM   Specimen: Nasopharyngeal Swab; Nasopharyngeal(NP) swabs in vial transport medium  Result Value Ref Range Status   SARS Coronavirus 2 by RT PCR NEGATIVE NEGATIVE Final    Comment: (NOTE) SARS-CoV-2 target nucleic acids are NOT DETECTED.  The SARS-CoV-2 RNA is generally detectable in upper respiratory specimens during the acute phase of infection. The lowest concentration of SARS-CoV-2 viral copies this assay can detect is 138 copies/mL. A negative result does not preclude SARS-Cov-2 infection and should not be used as the sole basis for treatment or other patient management decisions. A negative result may occur with  improper specimen collection/handling, submission of specimen other than nasopharyngeal swab, presence of viral mutation(s) within the areas targeted by this assay, and inadequate number of viral copies(<138 copies/mL). A negative result must be combined with clinical observations, patient history, and epidemiological information. The expected result is Negative.  Fact Sheet for Patients:  EntrepreneurPulse.com.au  Fact Sheet for Healthcare Providers:  IncredibleEmployment.be  This test is no t yet approved or cleared by the [place] FDA and  has been authorized for detection and/or diagnosis of SARS-CoV-2 by FDA under an Emergency Use Authorization (EUA). This EUA will  remain  in effect (meaning this test can be used) for the duration of the COVID-19 declaration under Section 564(b)(1) of the Act, 21 U.S.C.section 360bbb-3(b)(1), unless the authorization is terminated  or revoked sooner.       Influenza A by PCR NEGATIVE NEGATIVE Final   Influenza B by PCR NEGATIVE NEGATIVE Final    Comment: (NOTE) The Xpert Xpress SARS-CoV-2/FLU/RSV plus assay is intended as an aid in the diagnosis of influenza from Nasopharyngeal swab specimens and should not be used as a sole basis for treatment. Nasal washings and aspirates are unacceptable for Xpert Xpress SARS-CoV-2/FLU/RSV testing.  Fact Sheet for Patients: EntrepreneurPulse.com.au  Fact Sheet for Healthcare Providers: IncredibleEmployment.be  This test is not yet approved or cleared by the [place] FDA and has been authorized for detection and/or diagnosis of SARS-CoV-2 by FDA under an Emergency Use Authorization (EUA). This EUA will remain in effect (meaning this test can be used) for the duration of the COVID-19 declaration under Section 564(b)(1) of the Act, 21 U.S.C. section 360bbb-3(b)(1), unless the authorization is terminated or revoked.  Performed at [facility], [address], [place], [place] [address]   Blood Culture (routine x 2)     Status: None   Collection Time: 06/15/20  6:17 PM   Specimen: Right Antecubital; Blood  Result Value Ref Range Status   Specimen Description RIGHT ANTECUBITAL  Final   Special Requests   Final    BOTTLES DRAWN AEROBIC AND ANAEROBIC Blood Culture adequate volume  Culture   Final    NO GROWTH 5 DAYS Performed at [facility], [address], [place], [place] [address]    Report Status 06/20/2020 FINAL  Final     Labs: BNP (last 3 results) No results for input(s): BNP in the last 8760 hours. Basic Metabolic Panel: Recent Labs  Lab 06/15/20 1700 06/16/20 0348 06/17/20 0627 06/18/20 0631 06/20/20 1344  NA  144 141 139 141 136  K 4.0 2.8* 3.4* 3.8 3.6  CL 108 103 102 103 100  CO2 27 26 28 31 27   GLUCOSE 98 97 94 126* 107*  BUN 21 10 12 13 14   CREATININE 0.64 0.51 0.56 0.51 0.62  CALCIUM 8.4* 8.2* 8.1* 8.4* 8.2*  MG  --  1.7  --   --   --   PHOS  --  2.6  --   --   --    Liver Function Tests: Recent Labs  Lab 06/15/20 1700 06/16/20 0348  AST 28 30  ALT 37 37  ALKPHOS 69 68  BILITOT 0.7 0.7  PROT 6.7 6.3*  ALBUMIN 2.5* 2.4*   No results for input(s): LIPASE, AMYLASE in the last 168 hours. No results for input(s): AMMONIA in the last 168 hours. CBC: Recent Labs  Lab 06/15/20 1700 06/16/20 0348 06/17/20 0627 06/18/20 0631 06/20/20 1344  WBC 7.1 6.9 6.1 6.6 8.0  NEUTROABS 5.9  --  5.3 5.7 6.7  HGB 10.5* 10.5* 9.1* 9.4* 9.9*  HCT 33.0* 32.5* 27.7* 29.6* 30.9*  MCV 96.5 94.2 95.2 95.8 96.3  PLT 157 136* 133* 142* 180   Cardiac Enzymes: No results for input(s): CKTOTAL, CKMB, CKMBINDEX, TROPONINI in the last 168 hours. BNP: Invalid input(s): POCBNP CBG: Recent Labs  Lab 06/17/20 0747 06/17/20 1104  GLUCAP 77 151*   D-Dimer No results for input(s): DDIMER in the last 72 hours. Hgb A1c No results for input(s): HGBA1C in the last 72 hours. Lipid Profile No results for input(s): CHOL, HDL, LDLCALC, TRIG, CHOLHDL, LDLDIRECT in the last 72 hours. Thyroid function studies No results for input(s): TSH, T4TOTAL, T3FREE, THYROIDAB in the last 72 hours.  Invalid input(s): FREET3 Anemia work up No results for input(s): VITAMINB12, FOLATE, FERRITIN, TIBC, IRON, RETICCTPCT in the last 72 hours. Urinalysis    Component Value Date/Time   COLORURINE YELLOW 06/15/2020 1720   APPEARANCEUR CLEAR 06/15/2020 1720   LABSPEC 1.019 06/15/2020 1720   PHURINE 6.0 06/15/2020 1720   GLUCOSEU NEGATIVE 06/15/2020 1720   HGBUR NEGATIVE 06/15/2020 1720   BILIRUBINUR NEGATIVE 06/15/2020 1720   KETONESUR NEGATIVE 06/15/2020 1720   PROTEINUR 30 (A) 06/15/2020 1720   NITRITE NEGATIVE  06/15/2020 1720   LEUKOCYTESUR NEGATIVE 06/15/2020 1720   Sepsis Labs Invalid input(s): PROCALCITONIN,  WBC,  LACTICIDVEN Microbiology Recent Results (from the past 240 hour(s))  Resp Panel by RT-PCR (Flu A&B, Covid) Nasopharyngeal Swab     Status: None   Collection Time: 06/12/20  9:38 AM   Specimen: Nasopharyngeal Swab; Nasopharyngeal(NP) swabs in vial transport medium  Result Value Ref Range Status   SARS Coronavirus 2 by RT PCR NEGATIVE NEGATIVE Final    Comment: (NOTE) SARS-CoV-2 target nucleic acids are NOT DETECTED.  The SARS-CoV-2 RNA is generally detectable in upper respiratory specimens during the acute phase of infection. The lowest concentration of SARS-CoV-2 viral copies this assay can detect is 138 copies/mL. A negative result does not preclude SARS-Cov-2 infection and should not be used as the sole basis for treatment or other patient management decisions.  A negative result may occur with  improper specimen collection/handling, submission of specimen other than nasopharyngeal swab, presence of viral mutation(s) within the areas targeted by this assay, and inadequate number of viral copies(<138 copies/mL). A negative result must be combined with clinical observations, patient history, and epidemiological information. The expected result is Negative.  Fact Sheet for Patients:  EntrepreneurPulse.com.au  Fact Sheet for Healthcare Providers:  IncredibleEmployment.be  This test is no t yet approved or cleared by the [place] FDA and  has been authorized for detection and/or diagnosis of SARS-CoV-2 by FDA under an Emergency Use Authorization (EUA). This EUA will remain  in effect (meaning this test can be used) for the duration of the COVID-19 declaration under Section 564(b)(1) of the Act, 21 U.S.C.section 360bbb-3(b)(1), unless the authorization is terminated  or revoked sooner.       Influenza A by PCR NEGATIVE NEGATIVE  Final   Influenza B by PCR NEGATIVE NEGATIVE Final    Comment: (NOTE) The Xpert Xpress SARS-CoV-2/FLU/RSV plus assay is intended as an aid in the diagnosis of influenza from Nasopharyngeal swab specimens and should not be used as a sole basis for treatment. Nasal washings and aspirates are unacceptable for Xpert Xpress SARS-CoV-2/FLU/RSV testing.  Fact Sheet for Patients: EntrepreneurPulse.com.au  Fact Sheet for Healthcare Providers: IncredibleEmployment.be  This test is not yet approved or cleared by the [place] FDA and has been authorized for detection and/or diagnosis of SARS-CoV-2 by FDA under an Emergency Use Authorization (EUA). This EUA will remain in effect (meaning this test can be used) for the duration of the COVID-19 declaration under Section 564(b)(1) of the Act, 21 U.S.C. section 360bbb-3(b)(1), unless the authorization is terminated or revoked.  Performed at [facility], [address] [place], [place] [address]   Urine culture     Status: Abnormal   Collection Time: 06/12/20 11:43 AM   Specimen: Urine, Clean Catch  Result Value Ref Range Status   Specimen Description   Final    URINE, CLEAN CATCH Performed at [facility], [address], [place], [place] [address]    Special Requests   Final    NONE Performed at [facility], [address], [place], [place] [address]    Culture >=100,000 COLONIES/mL ESCHERICHIA COLI (A)  Final   Report Status 06/15/2020 FINAL  Final   Organism ID, Bacteria ESCHERICHIA COLI (A)  Final      Susceptibility   Escherichia coli - MIC*    AMPICILLIN >=32 RESISTANT Resistant     CEFAZOLIN <=4 SENSITIVE Sensitive     CEFEPIME <=0.12 SENSITIVE Sensitive     CEFTRIAXONE <=0.25 SENSITIVE Sensitive     CIPROFLOXACIN >=4 RESISTANT Resistant     GENTAMICIN <=1 SENSITIVE Sensitive     IMIPENEM <=0.25 SENSITIVE Sensitive     NITROFURANTOIN <=16 SENSITIVE Sensitive     TRIMETH/SULFA >=320  RESISTANT Resistant     AMPICILLIN/SULBACTAM 8 SENSITIVE Sensitive     PIP/TAZO <=4 SENSITIVE Sensitive     * >=100,000 COLONIES/mL ESCHERICHIA COLI  Blood Culture (routine x 2)     Status: None   Collection Time: 06/15/20  5:00 PM   Specimen: BLOOD  Result Value Ref Range Status   Specimen Description BLOOD LEFT ANTECUBITAL  Final   Special Requests   Final    BOTTLES DRAWN AEROBIC AND ANAEROBIC Blood Culture results may not be optimal due to an inadequate volume of blood received in culture bottles   Culture   Final    NO  GROWTH 5 DAYS Performed at [facility], [address], [place], [place] [address]    Report Status 06/20/2020 FINAL  Final  Urine culture     Status: Abnormal   Collection Time: 06/15/20  5:20 PM   Specimen: In/Out Cath Urine  Result Value Ref Range Status   Specimen Description   Final    IN/OUT CATH URINE Performed at [facility], [address] [place], [place] [address]    Special Requests   Final    NONE Performed at [facility], [address], [place],  [address]    Culture 30,000 COLONIES/mL ESCHERICHIA COLI (A)  Final   Report Status 06/18/2020 FINAL  Final   Organism ID, Bacteria ESCHERICHIA COLI (A)  Final      Susceptibility   Escherichia coli - MIC*    AMPICILLIN >=32 RESISTANT Resistant     CEFAZOLIN <=4 SENSITIVE Sensitive     CEFEPIME <=0.12 SENSITIVE Sensitive     CEFTRIAXONE <=0.25 SENSITIVE Sensitive     CIPROFLOXACIN >=4 RESISTANT Resistant     GENTAMICIN <=1 SENSITIVE Sensitive     IMIPENEM <=0.25 SENSITIVE Sensitive     NITROFURANTOIN <=16 SENSITIVE Sensitive     TRIMETH/SULFA >=320 RESISTANT Resistant     AMPICILLIN/SULBACTAM 16 INTERMEDIATE Intermediate     PIP/TAZO <=4 SENSITIVE Sensitive     * 30,000 COLONIES/mL ESCHERICHIA COLI  Resp Panel by RT-PCR (Flu A&B, Covid) Nasopharyngeal Swab     Status: None   Collection Time: 06/15/20  5:20 PM   Specimen: Nasopharyngeal Swab; Nasopharyngeal(NP) swabs in vial transport  medium  Result Value Ref Range Status   SARS Coronavirus 2 by RT PCR NEGATIVE NEGATIVE Final    Comment: (NOTE) SARS-CoV-2 target nucleic acids are NOT DETECTED.  The SARS-CoV-2 RNA is generally detectable in upper respiratory specimens during the acute phase of infection. The lowest concentration of SARS-CoV-2 viral copies this assay can detect is 138 copies/mL. A negative result does not preclude SARS-Cov-2 infection and should not be used as the sole basis for treatment or other patient management decisions. A negative result may occur with  improper specimen collection/handling, submission of specimen other than nasopharyngeal swab, presence of viral mutation(s) within the areas targeted by this assay, and inadequate number of viral copies(<138 copies/mL). A negative result must be combined with clinical observations, patient history, and epidemiological information. The expected result is Negative.  Fact Sheet for Patients:  EntrepreneurPulse.com.au  Fact Sheet for Healthcare Providers:  IncredibleEmployment.be  This test is no t yet approved or cleared by the [place] FDA and  has been authorized for detection and/or diagnosis of SARS-CoV-2 by FDA under an Emergency Use Authorization (EUA). This EUA will remain  in effect (meaning this test can be used) for the duration of the COVID-19 declaration under Section 564(b)(1) of the Act, 21 U.S.C.section 360bbb-3(b)(1), unless the authorization is terminated  or revoked sooner.       Influenza A by PCR NEGATIVE NEGATIVE Final   Influenza B by PCR NEGATIVE NEGATIVE Final    Comment: (NOTE) The Xpert Xpress SARS-CoV-2/FLU/RSV plus assay is intended as an aid in the diagnosis of influenza from Nasopharyngeal swab specimens and should not be used as a sole basis for treatment. Nasal washings and aspirates are unacceptable for Xpert Xpress SARS-CoV-2/FLU/RSV testing.  Fact Sheet for  Patients: EntrepreneurPulse.com.au  Fact Sheet for Healthcare Providers: IncredibleEmployment.be  This test is not yet approved or cleared by the [place] FDA and has been authorized for detection  and/or diagnosis of SARS-CoV-2 by FDA under an Emergency Use Authorization (EUA). This EUA will remain in effect (meaning this test can be used) for the duration of the COVID-19 declaration under Section 564(b)(1) of the Act, 21 U.S.C. section 360bbb-3(b)(1), unless the authorization is terminated or revoked.  Performed at [facility], [address], [place], [place] [address]   Blood Culture (routine x 2)     Status: None   Collection Time: 06/15/20  6:17 PM   Specimen: Right Antecubital; Blood  Result Value Ref Range Status   Specimen Description RIGHT ANTECUBITAL  Final   Special Requests   Final    BOTTLES DRAWN AEROBIC AND ANAEROBIC Blood Culture adequate volume   Culture   Final    NO GROWTH 5 DAYS Performed at [facility], [address] [place], [place] [address]    Report Status 06/20/2020 FINAL  Final     Time coordinating discharge: Over 30 minutes  SIGNED:   Darliss Cheney, MD  [facility] 06/21/2020, 9:29 AM  If 7PM-7AM, please contact night-coverage [email or web address]

## 2020-06-21 NOTE — Progress Notes (Signed)
Pt has not voided since foley removed at 0900. Pt has been up to [facility] multiple times with no success. Bladder scan performed, 227ml of urine noted in bladder. Pt with no noticed discomfort with palpation of abd/bladder area. MD Pahwani notified.

## 2020-06-21 NOTE — Discharge Instructions (Signed)
Community-Acquired Pneumonia, Adult Pneumonia is an infection of the lungs. It causes irritation and swelling in the airways of the lungs. Mucus and fluid may also build up inside the airways. This may cause coughing and trouble breathing. One type of pneumonia can happen while you are in a hospital. A different type can happen when you are not in a hospital (community-acquired pneumonia). What are the causes? This condition is caused by germs (viruses, bacteria, or fungi). Some types of germs can spread from person to person. Pneumonia is not thought to spread from person to person.   What increases the risk? You are more likely to develop this condition if:  You have a long-term (chronic) disease, such as: ? Disease of the lungs. This may be chronic obstructive pulmonary disease (COPD) or asthma. ? Heart failure. ? Cystic fibrosis. ? Diabetes. ? Kidney disease. ? Sickle cell disease. ? HIV.  You have other health problems, such as: ? Your body's defense system (immune system) is weak. ? A condition that may cause you to breathe in fluids from your mouth and nose.  You had your spleen taken out.  You do not take good care of your teeth and mouth (poor dental hygiene).  You use or have used tobacco products.  You travel where the germs that cause this illness are common.  You are near certain animals or the places they live.  You are older than 72 years of age. What are the signs or symptoms? Symptoms of this condition include:  A cough.  A fever.  Sweating or chills.  Chest pain, often when you breathe deeply or cough.  Breathing problems, such as: ? Fast breathing. ? Trouble breathing. ? Shortness of breath.  Feeling tired (fatigued).  Muscle aches. How is this treated? Treatment for this condition depends on many things, such as:  The cause of your illness.  Your medicines.  Your other health problems. Most adults can be treated at home. Sometimes,  treatment must happen in a hospital.  Treatment may include medicines to kill germs.  Medicines may depend on which germ caused your illness. Very bad pneumonia is rare. If you get it, you may:  Have a machine to help you breathe.  Have fluid taken away from around your lungs. Follow these instructions at home: Medicines  Take over-the-counter and prescription medicines only as told by your doctor.  Take cough medicine only if you are losing sleep. Cough medicine can keep your body from taking mucus away from your lungs.  If you were prescribed an antibiotic medicine, take it as told by your doctor. Do not stop taking the antibiotic even if you start to feel better. Lifestyle  Do not drink alcohol.  Do not use any products that contain nicotine or tobacco, such as cigarettes, e-cigarettes, and chewing tobacco. If you need help quitting, ask your doctor.  Eat a healthy diet. This includes a lot of vegetables, fruits, whole grains, low-fat dairy products, and low-fat (lean) protein.      General instructions  Rest a lot. Sleep for at least 8 hours each night.  Sleep with your head and neck raised. Put a few pillows under your head or sleep in a reclining chair.  Return to your normal activities as told by your doctor. Ask your doctor what activities are safe for you.  Drink enough fluid to keep your pee (urine) pale yellow.  If your throat is sore, rinse your mouth often with salt water. To make salt   water, dissolve -1 tsp (3-6 g) of salt in 1 cup (237 mL) of warm water.  Keep all follow-up visits as told by your doctor. This is important.   How is this prevented? You can lower your risk of pneumonia by:  Getting the pneumonia shot (vaccine). These shots have different types and schedules. Ask your doctor what works best for you. Think about getting this shot if: ? You are older than 72 years of age. ? You are 19-65 years of age and:  You are being treated for  cancer.  You have long-term lung disease.  You have other problems that affect your body's defense system. Ask your doctor if you have one of these.  Getting your flu shot every year. Ask your doctor which type of shot is best for you.  Going to the dentist as often as told.  Washing your hands often with soap and water for at least 20 seconds. If you cannot use soap and water, use hand sanitizer. Contact a doctor if:  You have a fever.  You lose sleep because your cough medicine does not help. Get help right away if:  You are short of breath and this gets worse.  You have more chest pain.  Your sickness gets worse. This is very serious if: ? You are an older adult. ? Your body's defense system is weak.  You cough up blood. These symptoms may be an emergency. Do not wait to see if the symptoms will go away. Get medical help right away. Call your local emergency services (911 in the [place] Do not drive yourself to the hospital. Summary  Pneumonia is an infection of the lungs.  Community-acquired pneumonia affects people who have not been in the hospital. Certain germs can cause this infection.  This condition may be treated with medicines that kill germs.  For very bad pneumonia, you may need a hospital stay and treatment to help with breathing. This information is not intended to replace advice given to you by your health care provider. Make sure you discuss any questions you have with your health care provider. Document Revised: 02/25/2019 Document Reviewed: 02/25/2019 Elsevier Patient Education  2021 Elsevier Inc.  

## 2020-06-22 ENCOUNTER — Telehealth: Payer: Self-pay

## 2020-06-22 DIAGNOSIS — C719 Malignant neoplasm of brain, unspecified: Secondary | ICD-10-CM | POA: Diagnosis not present

## 2020-06-22 DIAGNOSIS — E7849 Other hyperlipidemia: Secondary | ICD-10-CM | POA: Diagnosis not present

## 2020-06-22 DIAGNOSIS — I10 Essential (primary) hypertension: Secondary | ICD-10-CM | POA: Diagnosis not present

## 2020-06-22 DIAGNOSIS — R531 Weakness: Secondary | ICD-10-CM | POA: Diagnosis not present

## 2020-06-22 NOTE — Telephone Encounter (Signed)
Spoke with patient's daughter [name] to reschedule Palliative care consult appointment. Patient is now in a SNF for therapy. [name] said they will reach back out if Palliative care is needed when patient is discharged. Referral canceled. MD notified

## 2020-06-23 NOTE — Telephone Encounter (Signed)
Noted, thanks!

## 2020-06-25 DIAGNOSIS — I10 Essential (primary) hypertension: Secondary | ICD-10-CM | POA: Diagnosis not present

## 2020-06-25 DIAGNOSIS — G40909 Epilepsy, unspecified, not intractable, without status epilepticus: Secondary | ICD-10-CM | POA: Diagnosis not present

## 2020-06-25 DIAGNOSIS — D696 Thrombocytopenia, unspecified: Secondary | ICD-10-CM | POA: Diagnosis not present

## 2020-06-25 DIAGNOSIS — E042 Nontoxic multinodular goiter: Secondary | ICD-10-CM

## 2020-06-25 DIAGNOSIS — Z9181 History of falling: Secondary | ICD-10-CM

## 2020-06-25 DIAGNOSIS — C713 Malignant neoplasm of parietal lobe: Secondary | ICD-10-CM | POA: Diagnosis not present

## 2020-06-25 DIAGNOSIS — E785 Hyperlipidemia, unspecified: Secondary | ICD-10-CM

## 2020-06-25 DIAGNOSIS — D126 Benign neoplasm of colon, unspecified: Secondary | ICD-10-CM

## 2020-06-25 DIAGNOSIS — G8191 Hemiplegia, unspecified affecting right dominant side: Secondary | ICD-10-CM | POA: Diagnosis not present

## 2020-06-25 DIAGNOSIS — H409 Unspecified glaucoma: Secondary | ICD-10-CM | POA: Diagnosis not present

## 2020-06-25 DIAGNOSIS — R739 Hyperglycemia, unspecified: Secondary | ICD-10-CM | POA: Diagnosis not present

## 2020-06-25 DIAGNOSIS — B028 Zoster with other complications: Secondary | ICD-10-CM | POA: Diagnosis not present

## 2020-06-25 DIAGNOSIS — Z9221 Personal history of antineoplastic chemotherapy: Secondary | ICD-10-CM

## 2020-06-25 DIAGNOSIS — Z7952 Long term (current) use of systemic steroids: Secondary | ICD-10-CM

## 2020-06-25 DIAGNOSIS — Z7983 Long term (current) use of bisphosphonates: Secondary | ICD-10-CM

## 2020-06-25 DIAGNOSIS — R7401 Elevation of levels of liver transaminase levels: Secondary | ICD-10-CM

## 2020-06-25 DIAGNOSIS — B0230 Zoster ocular disease, unspecified: Secondary | ICD-10-CM | POA: Diagnosis not present

## 2020-06-28 ENCOUNTER — Ambulatory Visit: Payer: Medicare PPO | Admitting: Internal Medicine

## 2020-06-28 ENCOUNTER — Other Ambulatory Visit: Payer: Medicare PPO

## 2020-06-28 ENCOUNTER — Telehealth: Payer: Self-pay | Admitting: *Deleted

## 2020-06-28 NOTE — Telephone Encounter (Signed)
TCT patient and her daughter as pt did not show for her appt today with Dr. Mickeal Skinner. No answer to calls to patient, daughter or pt's sister. VM message left forher or family member to return call as soon as they can to re-schedule pt's labs and MD appt.

## 2020-06-28 NOTE — Telephone Encounter (Signed)
Received call back from pt's daughter, [name]. [name] states that her mom remains at the [facility] and [facility]. [name] states that her mom is not doing as well as she had hoped.  [name] states that mentally, her mom is 'not all there' and her mobility is not improving much. [name] states that she can bring her mother to appts. Advised that we can re-scheduled for 07/05/20 with 10:30 am labs and 11am Dr. Mickeal Skinner appt. [name] voiced understanding.  Dr. Mickeal Skinner made aware  Scheduling message sent

## 2020-07-02 DIAGNOSIS — G8191 Hemiplegia, unspecified affecting right dominant side: Secondary | ICD-10-CM | POA: Diagnosis not present

## 2020-07-02 DIAGNOSIS — C713 Malignant neoplasm of parietal lobe: Secondary | ICD-10-CM | POA: Diagnosis not present

## 2020-07-02 DIAGNOSIS — G40909 Epilepsy, unspecified, not intractable, without status epilepticus: Secondary | ICD-10-CM | POA: Diagnosis not present

## 2020-07-02 DIAGNOSIS — B028 Zoster with other complications: Secondary | ICD-10-CM | POA: Diagnosis not present

## 2020-07-02 DIAGNOSIS — R739 Hyperglycemia, unspecified: Secondary | ICD-10-CM | POA: Diagnosis not present

## 2020-07-02 DIAGNOSIS — H409 Unspecified glaucoma: Secondary | ICD-10-CM | POA: Diagnosis not present

## 2020-07-02 DIAGNOSIS — B0230 Zoster ocular disease, unspecified: Secondary | ICD-10-CM | POA: Diagnosis not present

## 2020-07-02 DIAGNOSIS — D696 Thrombocytopenia, unspecified: Secondary | ICD-10-CM | POA: Diagnosis not present

## 2020-07-02 DIAGNOSIS — I10 Essential (primary) hypertension: Secondary | ICD-10-CM | POA: Diagnosis not present

## 2020-07-05 ENCOUNTER — Encounter (HOSPITAL_COMMUNITY): Payer: Self-pay | Admitting: *Deleted

## 2020-07-05 ENCOUNTER — Other Ambulatory Visit: Payer: Self-pay

## 2020-07-05 ENCOUNTER — Inpatient Hospital Stay: Payer: Medicare PPO | Attending: Internal Medicine

## 2020-07-05 ENCOUNTER — Emergency Department (HOSPITAL_COMMUNITY): Payer: Medicare PPO

## 2020-07-05 ENCOUNTER — Inpatient Hospital Stay (HOSPITAL_COMMUNITY)
Admission: EM | Admit: 2020-07-05 | Discharge: 2020-07-07 | DRG: 871 | Disposition: A | Discharge status: Hospice | Payer: Medicare PPO | Attending: Internal Medicine | Admitting: Internal Medicine

## 2020-07-05 ENCOUNTER — Inpatient Hospital Stay: Payer: Medicare PPO | Admitting: Internal Medicine

## 2020-07-05 VITALS — BP 133/65 | HR 113 | Temp 100.4°F | Resp 15

## 2020-07-05 DIAGNOSIS — Z833 Family history of diabetes mellitus: Secondary | ICD-10-CM | POA: Diagnosis not present

## 2020-07-05 DIAGNOSIS — R569 Unspecified convulsions: Secondary | ICD-10-CM | POA: Diagnosis present

## 2020-07-05 DIAGNOSIS — A4189 Other specified sepsis: Principal | ICD-10-CM | POA: Diagnosis present

## 2020-07-05 DIAGNOSIS — B029 Zoster without complications: Secondary | ICD-10-CM | POA: Diagnosis not present

## 2020-07-05 DIAGNOSIS — R627 Adult failure to thrive: Secondary | ICD-10-CM | POA: Diagnosis present

## 2020-07-05 DIAGNOSIS — R464 Slowness and poor responsiveness: Secondary | ICD-10-CM | POA: Diagnosis not present

## 2020-07-05 DIAGNOSIS — G9340 Encephalopathy, unspecified: Secondary | ICD-10-CM

## 2020-07-05 DIAGNOSIS — I10 Essential (primary) hypertension: Secondary | ICD-10-CM | POA: Diagnosis present

## 2020-07-05 DIAGNOSIS — R279 Unspecified lack of coordination: Secondary | ICD-10-CM | POA: Diagnosis not present

## 2020-07-05 DIAGNOSIS — Z7983 Long term (current) use of bisphosphonates: Secondary | ICD-10-CM

## 2020-07-05 DIAGNOSIS — Z8249 Family history of ischemic heart disease and other diseases of the circulatory system: Secondary | ICD-10-CM

## 2020-07-05 DIAGNOSIS — J1282 Pneumonia due to coronavirus disease 2019: Secondary | ICD-10-CM | POA: Diagnosis present

## 2020-07-05 DIAGNOSIS — G9341 Metabolic encephalopathy: Secondary | ICD-10-CM | POA: Diagnosis not present

## 2020-07-05 DIAGNOSIS — E785 Hyperlipidemia, unspecified: Secondary | ICD-10-CM | POA: Diagnosis present

## 2020-07-05 DIAGNOSIS — Z515 Encounter for palliative care: Secondary | ICD-10-CM | POA: Diagnosis not present

## 2020-07-05 DIAGNOSIS — H409 Unspecified glaucoma: Secondary | ICD-10-CM | POA: Diagnosis present

## 2020-07-05 DIAGNOSIS — Z79899 Other long term (current) drug therapy: Secondary | ICD-10-CM

## 2020-07-05 DIAGNOSIS — C719 Malignant neoplasm of brain, unspecified: Secondary | ICD-10-CM

## 2020-07-05 DIAGNOSIS — G9389 Other specified disorders of brain: Secondary | ICD-10-CM | POA: Diagnosis not present

## 2020-07-05 DIAGNOSIS — R000 Tachycardia, unspecified: Secondary | ICD-10-CM | POA: Diagnosis not present

## 2020-07-05 DIAGNOSIS — Z803 Family history of malignant neoplasm of breast: Secondary | ICD-10-CM

## 2020-07-05 DIAGNOSIS — Z820 Family history of epilepsy and other diseases of the nervous system: Secondary | ICD-10-CM

## 2020-07-05 DIAGNOSIS — A419 Sepsis, unspecified organism: Secondary | ICD-10-CM | POA: Diagnosis present

## 2020-07-05 DIAGNOSIS — R4182 Altered mental status, unspecified: Secondary | ICD-10-CM | POA: Diagnosis not present

## 2020-07-05 DIAGNOSIS — H748X1 Other specified disorders of right middle ear and mastoid: Secondary | ICD-10-CM | POA: Diagnosis not present

## 2020-07-05 DIAGNOSIS — U071 COVID-19: Secondary | ICD-10-CM | POA: Diagnosis present

## 2020-07-05 DIAGNOSIS — Z66 Do not resuscitate: Secondary | ICD-10-CM | POA: Diagnosis not present

## 2020-07-05 DIAGNOSIS — R7303 Prediabetes: Secondary | ICD-10-CM | POA: Diagnosis present

## 2020-07-05 DIAGNOSIS — R6520 Severe sepsis without septic shock: Secondary | ICD-10-CM | POA: Diagnosis not present

## 2020-07-05 DIAGNOSIS — R4189 Other symptoms and signs involving cognitive functions and awareness: Secondary | ICD-10-CM

## 2020-07-05 DIAGNOSIS — Z743 Need for continuous supervision: Secondary | ICD-10-CM | POA: Diagnosis not present

## 2020-07-05 LAB — COMPREHENSIVE METABOLIC PANEL
ALT: 26 U/L (ref 0–44)
AST: 29 U/L (ref 15–41)
Albumin: 3.8 g/dL (ref 3.5–5.0)
Alkaline Phosphatase: 67 U/L (ref 38–126)
Anion gap: 12 (ref 5–15)
BUN: 21 mg/dL (ref 8–23)
CO2: 28 mmol/L (ref 22–32)
Calcium: 9.8 mg/dL (ref 8.9–10.3)
Chloride: 101 mmol/L (ref 98–111)
Creatinine, Ser: 0.89 mg/dL (ref 0.44–1.00)
GFR, Estimated: >60 mL/min (ref >60)
Glucose, Bld: 115 mg/dL — ABNORMAL HIGH (ref 70–99)
Potassium: 4.5 mmol/L (ref 3.5–5.1)
Sodium: 141 mmol/L (ref 135–145)
Total Bilirubin: 0.8 mg/dL (ref 0.3–1.2)
Total Protein: 8.1 g/dL (ref 6.5–8.1)

## 2020-07-05 LAB — CBC WITH DIFFERENTIAL/PLATELET
Abs Immature Granulocytes: 0.03 10*3/uL (ref 0.00–0.07)
Basophils Absolute: 0 10*3/uL (ref 0.0–0.1)
Basophils Relative: 0 %
Eosinophils Absolute: 0 10*3/uL (ref 0.0–0.5)
Eosinophils Relative: 0 %
HCT: 37.8 % (ref 36.0–46.0)
Hemoglobin: 11.6 g/dL — ABNORMAL LOW (ref 12.0–15.0)
Immature Granulocytes: 0 %
Lymphocytes Relative: 49 %
Lymphs Abs: 3.9 10*3/uL (ref 0.7–4.0)
MCH: 30.6 pg (ref 26.0–34.0)
MCHC: 30.7 g/dL (ref 30.0–36.0)
MCV: 99.7 fL (ref 80.0–100.0)
Monocytes Absolute: 0.7 10*3/uL (ref 0.1–1.0)
Monocytes Relative: 9 %
Neutro Abs: 3.4 10*3/uL (ref 1.7–7.7)
Neutrophils Relative %: 42 %
Platelets: 230 10*3/uL (ref 150–400)
RBC: 3.79 MIL/uL — ABNORMAL LOW (ref 3.87–5.11)
RDW: 18.9 % — ABNORMAL HIGH (ref 11.5–15.5)
WBC: 8.1 10*3/uL (ref 4.0–10.5)
nRBC: 0 % (ref 0.0–0.2)

## 2020-07-05 LAB — CMP (CANCER CENTER ONLY)
ALT: 25 U/L (ref 0–44)
AST: 21 U/L (ref 15–41)
Albumin: 3.3 g/dL — ABNORMAL LOW (ref 3.5–5.0)
Alkaline Phosphatase: 70 U/L (ref 38–126)
Anion gap: 10 (ref 5–15)
BUN: 17 mg/dL (ref 8–23)
CO2: 27 mmol/L (ref 22–32)
Calcium: 9.6 mg/dL (ref 8.9–10.3)
Chloride: 103 mmol/L (ref 98–111)
Creatinine: 0.8 mg/dL (ref 0.44–1.00)
GFR, Estimated: >60 mL/min (ref >60)
Glucose, Bld: 109 mg/dL — ABNORMAL HIGH (ref 70–99)
Potassium: 4.4 mmol/L (ref 3.5–5.1)
Sodium: 140 mmol/L (ref 135–145)
Total Bilirubin: 0.5 mg/dL (ref 0.3–1.2)
Total Protein: 7.1 g/dL (ref 6.5–8.1)

## 2020-07-05 LAB — CBC WITH DIFFERENTIAL (CANCER CENTER ONLY)
Abs Immature Granulocytes: 0.04 10*3/uL (ref 0.00–0.07)
Basophils Absolute: 0 10*3/uL (ref 0.0–0.1)
Basophils Relative: 0 %
Eosinophils Absolute: 0 10*3/uL (ref 0.0–0.5)
Eosinophils Relative: 0 %
HCT: 36.8 % (ref 36.0–46.0)
Hemoglobin: 11.3 g/dL — ABNORMAL LOW (ref 12.0–15.0)
Immature Granulocytes: 1 %
Lymphocytes Relative: 36 %
Lymphs Abs: 2.5 10*3/uL (ref 0.7–4.0)
MCH: 30.2 pg (ref 26.0–34.0)
MCHC: 30.7 g/dL (ref 30.0–36.0)
MCV: 98.4 fL (ref 80.0–100.0)
Monocytes Absolute: 0.8 10*3/uL (ref 0.1–1.0)
Monocytes Relative: 11 %
Neutro Abs: 3.6 10*3/uL (ref 1.7–7.7)
Neutrophils Relative %: 52 %
Platelet Count: 237 10*3/uL (ref 150–400)
RBC: 3.74 MIL/uL — ABNORMAL LOW (ref 3.87–5.11)
RDW: 19.1 % — ABNORMAL HIGH (ref 11.5–15.5)
WBC Count: 7 10*3/uL (ref 4.0–10.5)
nRBC: 0 % (ref 0.0–0.2)

## 2020-07-05 LAB — SARS CORONAVIRUS 2 BY RT PCR (HOSPITAL ORDER, PERFORMED IN ~~LOC~~ HOSPITAL LAB): SARS Coronavirus 2: POSITIVE — AB

## 2020-07-05 LAB — ETHANOL: Alcohol, Ethyl (B): <10 mg/dL (ref <10)

## 2020-07-05 LAB — CBG MONITORING, ED: Glucose-Capillary: 87 mg/dL (ref 70–99)

## 2020-07-05 MED ORDER — BIOTENE DRY MOUTH MT LIQD: 15.0000 mL | OROMUCOSAL | Status: DC | PRN

## 2020-07-05 MED ORDER — LORAZEPAM 2 MG/ML IJ SOLN: 1.0000 mg | INTRAMUSCULAR | Status: DC | PRN

## 2020-07-05 MED ORDER — ACETAMINOPHEN 650 MG RE SUPP
650.0000 mg | Freq: Once | RECTAL | Status: DC
  Administered 2020-07-05: 650 mg via RECTAL

## 2020-07-05 MED ORDER — HALOPERIDOL LACTATE 5 MG/ML IJ SOLN
0.5000 mg | INTRAMUSCULAR | Status: DC | PRN
  Administered 2020-07-06: 0.5 mg via INTRAVENOUS
  Filled 2020-07-05: qty 1

## 2020-07-05 MED ORDER — ACETAMINOPHEN 325 MG PO TABS: 650.0000 mg | ORAL_TABLET | Freq: Four times a day (QID) | ORAL | Status: DC | PRN

## 2020-07-05 MED ORDER — HALOPERIDOL LACTATE 2 MG/ML PO CONC
0.5000 mg | ORAL | Status: DC | PRN
  Filled 2020-07-05: qty 0.3

## 2020-07-05 MED ORDER — ONDANSETRON HCL 4 MG/2ML IJ SOLN: 4.0000 mg | Freq: Four times a day (QID) | INTRAMUSCULAR | Status: DC | PRN

## 2020-07-05 MED ORDER — GLYCOPYRROLATE 0.2 MG/ML IJ SOLN
0.2000 mg | INTRAMUSCULAR | Status: DC | PRN
  Filled 2020-07-05: qty 1

## 2020-07-05 MED ORDER — GLYCOPYRROLATE 1 MG PO TABS
1.0000 mg | ORAL_TABLET | ORAL | Status: DC | PRN
  Filled 2020-07-05: qty 1

## 2020-07-05 MED ORDER — POLYVINYL ALCOHOL 1.4 % OP SOLN: 1.0000 [drp] | Freq: Four times a day (QID) | OPHTHALMIC | Status: DC | PRN

## 2020-07-05 MED ORDER — MORPHINE SULFATE (PF) 2 MG/ML IV SOLN
1.0000 mg | INTRAVENOUS | Status: DC | PRN
  Administered 2020-07-06 – 2020-07-07 (×4): 1 mg via INTRAVENOUS
  Filled 2020-07-05 (×4): qty 1

## 2020-07-05 MED ORDER — LEVETIRACETAM IN NACL 1000 MG/100ML IV SOLN
1000.0000 mg | Freq: Two times a day (BID) | INTRAVENOUS | Status: DC
  Administered 2020-07-05: 1000 mg via INTRAVENOUS
  Filled 2020-07-05: qty 100

## 2020-07-05 MED ORDER — HALOPERIDOL 0.5 MG PO TABS
0.5000 mg | ORAL_TABLET | ORAL | Status: DC | PRN
  Filled 2020-07-05: qty 1

## 2020-07-05 MED ORDER — ACETAMINOPHEN 650 MG RE SUPP
650.0000 mg | Freq: Four times a day (QID) | RECTAL | Status: DC | PRN
  Filled 2020-07-05: qty 1

## 2020-07-05 MED ORDER — GADOBUTROL 1 MMOL/ML IV SOLN
5.0000 mL | Freq: Once | INTRAVENOUS | Status: AC | PRN
  Administered 2020-07-05: 5 mL via INTRAVENOUS

## 2020-07-05 MED ORDER — ONDANSETRON 4 MG PO TBDP: 4.0000 mg | ORAL_TABLET | Freq: Four times a day (QID) | ORAL | Status: DC | PRN

## 2020-07-05 NOTE — Progress Notes (Signed)
[facility] [place], [place] [address] [phone]   Interval Evaluation  Date of Service: 07/05/20 Patient Name: Wanda Ross Patient MRN: [id number] Patient DOB: 25-Oct-1950 Provider: Ventura Sellers, MD  Identifying Statement:  Wanda Ross is a 70 y.o. female with left frontal glioblastoma   Oncologic History: 11/26/19: Craniotomy, resection of L parietal mass with Dr. Marcello Moores; path is GBM 02/09/20: Completes IMRT and concurrent Temozolomide 60m/m2 07/15/20: Clinical progression of disease, not candidate for further systemic therapy  Biomarkers:  MGMT Unknown.  IDH 1/2 Wild type.  EGFR Unknown  TERT Unknown   Interval History:  Wanda Ross today for follow up after recent hospitalization for pneumonia and sepsis.  She has had ongoing decline in mental status, cognition, ability to perform activities of daily living.  She is currently only awake several hours per day, per family.  Patient is not able to provide history today due to mental status.  She has experienced several falls in the nursing facility, but details of those are unavailable.  On "good" days, she is able to participate with self feeding, but today has been less engaged and interactive.   H+P (12/15/19) Patient presented to medical attention in late June with several weeks of progressive symptoms, mainly characterized by gait impairment, confusion and disorientation.  CNS imaging was obtained which demonstrated 4-5cm mass in the left parietal region, consistent with likely brain brain tumor.  She underwent craniotomy and resection with Dr. TMarcello Mooreson 11/26/19.  Following surgery she developed some right sided weakness, but this recovered to essentially full strength following nearly 2 weeks course of inpatient rehab.  Currently, she is living at home with her sister, although prior to this she was living alone.  She maintains full  functional status, no limitations at present time.  Retired middle school principal.  Medications: Current Outpatient Medications on File Prior to Visit  Medication Sig Dispense Refill  . acetaminophen (TYLENOL) 325 MG tablet Take 2 tablets (650 mg total) by mouth every 4 (four) hours as needed for mild pain (temp > 100.5).    .Marland Kitchenalendronate (FOSAMAX) 70 MG tablet TAKE 1 TABLET (70 MG TOTAL) BY MOUTH EVERY 7 (SEVEN) DAYS. TAKE WITH A FULL GLASS OF WATER ON AN EMPTY STOMACH. (Patient taking differently: Take 70 mg by mouth once a week.) 12 tablet 3  . amLODipine (NORVASC) 5 MG tablet Take 1 tablet (5 mg total) by mouth daily. 90 tablet 1  . Calcium Carbonate-Vit D-Min (CALCIUM 1200) 1200-1000 MG-UNIT CHEW Chew 1,200 mg by mouth daily. 30 tablet 1  . cloNIDine (CATAPRES) 0.3 MG tablet TAKE 1 TABLET BY MOUTH EVERYDAY AT BEDTIME (Patient taking differently: Take 0.3 mg by mouth at bedtime.) 90 tablet 0  . Cyanocobalamin 2500 MCG TABS Take 2,500 mcg by mouth daily. (Patient taking differently: Take 2,500 mcg by mouth at bedtime.) 30 tablet 0  . DENTA 5000 PLUS 1.1 % CREA dental cream Take 1 application by mouth daily.    .Marland Kitchendexamethasone (DECADRON) 1 MG tablet Take 1 tablet (1 mg total) by mouth daily. 30 tablet 1  . gabapentin (NEURONTIN) 300 MG capsule Take 2 capsules (600 mg total) by mouth 3 (three) times daily. 180 capsule 2  . levETIRAcetam (KEPPRA) 1000 MG tablet Take 1 tablet (1,000 mg total) by mouth 2 (two) times daily. 60 tablet 0  . lidocaine (XYLOCAINE) 5 % ointment Apply 1 application topically as needed. 35.44 g 0  .  metoprolol tartrate (LOPRESSOR) 25 MG tablet Take 0.5 tablets (12.5 mg total) by mouth 2 (two) times daily. 30 tablet 0  . Multiple Vitamins-Minerals (CENTRUM SILVER ULTRA WOMENS) TABS Take 1 tablet by mouth daily.    . ondansetron (ZOFRAN) 8 MG tablet Take 1 tablet (8 mg total) by mouth 2 (two) times daily as needed (nausea and vomiting). May take 30-60 minutes prior to  Temodar administration if nausea/vomiting occurs. 30 tablet 1  . potassium chloride (KLOR-CON) 10 MEQ tablet Take 1 tablet (10 mEq total) by mouth daily. 7 tablet 0  . pravastatin (PRAVACHOL) 10 MG tablet Take 1 tablet (10 mg total) by mouth at bedtime.    . temozolomide (TEMODAR) 180 MG capsule Take 2 capsules (360 mg total) by mouth daily. May take on an empty stomach to decrease nausea & vomiting. 10 capsule 0  . UNABLE TO FIND Med Name: HB Otic Drops (hydrocortisone 1%,benzocaine 2%) Sig: Fill ear canal with 3-4 drops up to 3 to 4 times daily #15 mL 15 mL 0   No current facility-administered medications on file prior to visit.    Allergies: No Known Allergies Past Medical History:  Past Medical History:  Diagnosis Date  . Glaucoma   . Hyperlipidemia 2010  . Hypertension 1990  . IGT (impaired glucose tolerance) 05/11/2010  . Multinodular goiter 2010   normal function   Past Surgical History:  Past Surgical History:  Procedure Laterality Date  . APPLICATION OF CRANIAL NAVIGATION Left 11/26/2019   Procedure: APPLICATION OF CRANIAL NAVIGATION;  Surgeon: [name], MD;  Location: [facility];  Service: Neurosurgery;  Laterality: Left;  APPLICATION OF CRANIAL NAVIGATION  . COLONOSCOPY N/A 06/16/2016   Procedure: COLONOSCOPY;  Surgeon: Danie Binder, MD;  [facility];  Service: Endoscopy;  Laterality: N/A;  9:30 AM  . COLONOSCOPY N/A 10/22/2019   Procedure: COLONOSCOPY;  Surgeon: Daneil Dolin, MD;  [facility];  Service: Endoscopy;  Laterality: N/A;  9:30  . CRANIOTOMY Left 11/26/2019   Procedure: LEFT PARIETAL CRANIOTOMY FOR RESECTION OF TUMOR;  Surgeon: [name], MD;  Location: [facility];  Service: Neurosurgery;  Laterality: Left;  LEFT PARIETAL CRANIOTOMY FOR RESECTION OF TUMOR  . EYE SURGERY  2017   3 on left eye 1 on right eye for glaucoma by Dr. Venetia Maxon   . EYE SURGERY  2018   Cataract Surgery by Dr. Arlina Robes   . OPERATIVE ULTRASOUND N/A 11/26/2019    Procedure: OPERATIVE ULTRASOUND;  Surgeon: [name], MD;  Location: [facility];  Service: Neurosurgery;  Laterality: N/A;  OPERATIVE ULTRASOUND   Social History:  Social History   Socioeconomic History  . Marital status: Widowed    Spouse name: Not on file  . Number of children: 3  . Years of education: Not on file  . Highest education level: Not on file  Occupational History  . Occupation: employed   Tobacco Use  . Smoking status: Never Smoker  . Smokeless tobacco: Never Used  Vaping Use  . Vaping Use: Never used  Substance and Sexual Activity  . Alcohol use: No  . Drug use: No  . Sexual activity: Never    Birth control/protection: Post-menopausal  Other Topics Concern  . Not on file  Social History Narrative  . Not on file   Social Determinants of Health   Financial Resource Strain: Not on file  Food Insecurity: Not on file  Transportation Needs: Not on file  Physical Activity: Not on file  Stress: Not on file  Social Connections: Not on file  Intimate Partner Violence: Not on file   Family History:  Family History  Problem Relation Age of Onset  . Dementia Father   . Dementia Mother   . Diabetes Mother   . Hypertension Mother   . Breast cancer Sister   . Hypertension Sister   . Diabetes Brother     Review of Systems: Limited by mental status  Physical Exam: Vitals:   07/05/20 1050  BP: 133/65  Pulse: (!) 113  Resp: 15  Temp: (!) 100.4 F (38 C)  SpO2: 99%   KPS: 50. General: Drowsy, minimally interactive with provider.  Wheelchair bound. Head: Normal EENT: No conjunctival injection or scleral icterus.  Lungs: Resp effort normal Cardiac: Regular rate Abdomen: Non-distended abdomen Skin: Crusting, healing zoster rash right V3 distribution Extremities: No clubbing or edema  Neurologic Exam: Mental Status: Awakens with loud verbal stimulus, not alert with stimulation, not attentive to examiner.  Rest of mental status limited by arousal  impairment. Cranial Nerves: Visual fields appear full. Extra-ocular movements intact. No ptosis.  Motor: Tone and bulk are normal. Both arms and legs move against gravity with stimulation, right less than left. Reflexes are symmetric, no pathologic reflexes present.  Sensory: Grossly intact Gait: Deferred  Labs: I have reviewed the data as listed    Component Value Date/Time   NA 136 06/20/2020 1344   K 3.6 06/20/2020 1344   CL 100 06/20/2020 1344   CO2 27 06/20/2020 1344   GLUCOSE 107 (H) 06/20/2020 1344   BUN 14 06/20/2020 1344   CREATININE 0.62 06/20/2020 1344   CREATININE 0.66 05/31/2020 0925   CREATININE 0.86 05/21/2019 0754   CALCIUM 8.2 (L) 06/20/2020 1344   PROT 6.3 (L) 06/16/2020 0348   ALBUMIN 2.4 (L) 06/16/2020 0348   AST 30 06/16/2020 0348   AST 14 (L) 05/31/2020 0925   ALT 37 06/16/2020 0348   ALT 39 05/31/2020 0925   ALKPHOS 68 06/16/2020 0348   BILITOT 0.7 06/16/2020 0348   BILITOT 0.6 05/31/2020 0925   GFRNONAA >60 06/20/2020 1344   GFRNONAA >60 05/31/2020 0925   GFRNONAA 68 05/21/2019 0754   GFRAA >60 02/05/2020 1056   GFRAA 79 05/21/2019 0754   Lab Results  Component Value Date   WBC 7.0 07/05/2020   NEUTROABS 3.6 07/05/2020   HGB 11.3 (L) 07/05/2020   HCT 36.8 07/05/2020   MCV 98.4 07/05/2020   PLT 237 07/05/2020     Assessment/Plan Glioblastoma ([place]) [C71.9]  [name] presents today with a failure to thrive picture.  Etiology is likely progression of glioblastoma, combined with a recent flurry of systemic "hits": community acquired pneumonia with sepsis, urethritis, zoster reactivation with facial pain syndrome, and hospitalization for seizures.  Further workup from this point would involve MRI brain for characterization of degree of tumor growth; last chemotherapy was dosed in November because of recent comorbidity and infection burden.  We do not feel she is a candidate for further systemic or anti-cancer therapy, and strongly  recommended hospice care given her clinical status decline.  We extensively counseled her son and daughter regarding goals of care and limited pathways forward with traditional anti-cancer care.   They will take the next couple of days to discuss amongst close family before making a decision.    We will expect to hear from [name] and her family shortly for further discussion and implementation goals of care.  Until that is clarified,  they understand the nursing facility is likely to utilize emergency department for any further systemic symptoms, seizures, respiratory, and urinary symptoms.  All questions were answered. The patient knows to call the clinic with any problems, questions or concerns. No barriers to learning were detected.  I have spent a total of 40 minutes of face-to-face and non-face-to-face time, excluding clinical staff time, preparing to see patient, ordering tests and/or medications, counseling the patient, and independently interpreting results and communicating results to the patient/family/caregiver    Ventura Sellers, MD Medical Director of Neuro-Oncology [facility] at [place] 07/05/20 10:40 AM

## 2020-07-05 NOTE — Progress Notes (Signed)
Palliative Medicine RN Note: Consult order rec'd from Dr Vanita Panda in the ED. Pt left Dr Renda Rolls office then had a seizure. She has progressive GBM, and Dr Mickeal Skinner recommends hospice.   Our team will not be able to see her until tomorrow. Dr Hilma Favors gave a TOC order for residential hospice pending our visit tomorrow. Due to pt's COVID + status, she is eligible to go to either of the facilities run by [facility], but Mrs Arriaga lives in [place], so those locations may be prohibitive.  Marjie Skiff Pennelope Basque, RN, BSN, [facility] Palliative Medicine Team 07/05/2020 3:59 PM Office [phone]

## 2020-07-05 NOTE — ED Triage Notes (Signed)
Patient is coming from her car where she was unresponsive. Patient is reportedly a cancer patient with brain cancer. Patient only responsive to pain

## 2020-07-05 NOTE — H&P (Signed)
History and Physical    Wanda Ross EXN:[id number] DOB: 09-28-1950 DOA: 07/05/2020  PCP: Fayrene Helper, MD  Patient coming from: SNF  Chief Complaint: Altered mental status  HPI: Wanda Ross is a 70 y.o. female with medical history significant of glioblastoma, seizures, HTN, HLD. Presenting with altered mental status. History from daughter. Patient had an office visit with onco today. As she was leaving, the patient became unresponsive. The family presumed she had a seizure, so they brought her to the ED.    Of note, she has had several recent infections. She has been declining over these last several months. Family has had ongoing discussions with onco about her prognosis.   ED Course: She was started on keppra for presumed seizure. She was found to be COVID positive. MRI showed progression of cancer. Family, Onco, EDP discussed options. Family elected for DNR and comfort care measures only. [facility] was called for admisison.   Review of Systems:  Unable to obtain d/t mentation.  PMHx Past Medical History:  Diagnosis Date  . Glaucoma   . Hyperlipidemia 2010  . Hypertension 1990  . IGT (impaired glucose tolerance) 05/11/2010  . Multinodular goiter 2010   normal function    PSHx Past Surgical History:  Procedure Laterality Date  . APPLICATION OF CRANIAL NAVIGATION Left 11/26/2019   Procedure: APPLICATION OF CRANIAL NAVIGATION;  Surgeon: [name], MD;  Location: [facility];  Service: Neurosurgery;  Laterality: Left;  APPLICATION OF CRANIAL NAVIGATION  . COLONOSCOPY N/A 06/16/2016   Procedure: COLONOSCOPY;  Surgeon: Danie Binder, MD;  [facility];  Service: Endoscopy;  Laterality: N/A;  9:30 AM  . COLONOSCOPY N/A 10/22/2019   Procedure: COLONOSCOPY;  Surgeon: Daneil Dolin, MD;  [facility];  Service: Endoscopy;  Laterality: N/A;  9:30  . CRANIOTOMY Left 11/26/2019   Procedure: LEFT PARIETAL CRANIOTOMY FOR RESECTION OF TUMOR;  Surgeon:  [name], MD;  Location: [facility];  Service: Neurosurgery;  Laterality: Left;  LEFT PARIETAL CRANIOTOMY FOR RESECTION OF TUMOR  . EYE SURGERY  2017   3 on left eye 1 on right eye for glaucoma by Dr. Venetia Maxon   . EYE SURGERY  2018   Cataract Surgery by Dr. Arlina Robes   . OPERATIVE ULTRASOUND N/A 11/26/2019   Procedure: OPERATIVE ULTRASOUND;  Surgeon: [name], MD;  Location: [facility];  Service: Neurosurgery;  Laterality: N/A;  OPERATIVE ULTRASOUND    SocHx  reports that she has never smoked. She has never used smokeless tobacco. She reports that she does not drink alcohol and does not use drugs.  No Known Allergies  FamHx Family History  Problem Relation Age of Onset  . Dementia Father   . Dementia Mother   . Diabetes Mother   . Hypertension Mother   . Breast cancer Sister   . Hypertension Sister   . Diabetes Brother     Prior to Admission medications   Medication Sig Start Date End Date Taking? Authorizing Provider  acetaminophen (TYLENOL) 325 MG tablet Take 2 tablets (650 mg total) by mouth every 4 (four) hours as needed for mild pain (temp > 100.5). 12/02/19   Guilford Shi, MD  alendronate (FOSAMAX) 70 MG tablet TAKE 1 TABLET (70 MG TOTAL) BY MOUTH EVERY 7 (SEVEN) DAYS. TAKE WITH A FULL GLASS OF WATER ON AN EMPTY STOMACH. Patient taking differently: Take 70 mg by mouth once a week. 06/15/20   Fayrene Helper, MD  amLODipine (NORVASC) 5 MG  tablet Take 1 tablet (5 mg total) by mouth daily. 12/09/19   Angiulli, Lavon Paganini, PA-C  Calcium Carbonate-Vit D-Min (CALCIUM 1200) 1200-1000 MG-UNIT CHEW Chew 1,200 mg by mouth daily. 12/09/19   Angiulli, Lavon Paganini, PA-C  cloNIDine (CATAPRES) 0.3 MG tablet TAKE 1 TABLET BY MOUTH EVERYDAY AT BEDTIME Patient taking differently: Take 0.3 mg by mouth at bedtime. 12/09/19   Angiulli, Lavon Paganini, PA-C  Cyanocobalamin 2500 MCG TABS Take 2,500 mcg by mouth daily. Patient taking differently: Take 2,500 mcg by mouth at bedtime. 12/09/19   Angiulli,  Lavon Paganini, PA-C  DENTA 5000 PLUS 1.1 % CREA dental cream Take 1 application by mouth daily. 06/09/20   [provider]  dexamethasone (DECADRON) 1 MG tablet Take 1 tablet (1 mg total) by mouth daily. 06/15/20   Ventura Sellers, MD  gabapentin (NEURONTIN) 300 MG capsule Take 2 capsules (600 mg total) by mouth 3 (three) times daily. 06/10/20   Ventura Sellers, MD  levETIRAcetam (KEPPRA) 1000 MG tablet Take 1 tablet (1,000 mg total) by mouth 2 (two) times daily. 05/19/20   Aline August, MD  lidocaine (XYLOCAINE) 5 % ointment Apply 1 application topically as needed. 06/11/20   Ventura Sellers, MD  metoprolol tartrate (LOPRESSOR) 25 MG tablet Take 0.5 tablets (12.5 mg total) by mouth 2 (two) times daily. 06/21/20 07/21/20  Darliss Cheney, MD  Multiple Vitamins-Minerals (CENTRUM SILVER ULTRA WOMENS) TABS Take 1 tablet by mouth daily.    [provider]  ondansetron (ZOFRAN) 8 MG tablet Take 1 tablet (8 mg total) by mouth 2 (two) times daily as needed (nausea and vomiting). May take 30-60 minutes prior to Temodar administration if nausea/vomiting occurs. 03/08/20   Vaslow, Acey Lav, MD  potassium chloride (KLOR-CON) 10 MEQ tablet Take 1 tablet (10 mEq total) by mouth daily. 06/01/20   Noreene Larsson, NP  pravastatin (PRAVACHOL) 10 MG tablet Take 1 tablet (10 mg total) by mouth at bedtime. 05/19/20   Aline August, MD  temozolomide (TEMODAR) 180 MG capsule Take 2 capsules (360 mg total) by mouth daily. May take on an empty stomach to decrease nausea & vomiting. 06/15/20   Vaslow, Acey Lav, MD  UNABLE TO FIND Med Name: HB Otic Drops (hydrocortisone 1%,benzocaine 2%) Sig: Fill ear canal with 3-4 drops up to 3 to 4 times daily #15 mL 06/02/20   Noreene Larsson, NP    Physical Exam: Vitals:   07/05/20 1234 07/05/20 1300 07/05/20 1424 07/05/20 1617  BP:  118/60 119/66 109/63  Pulse:  (!) 111 (!) 115 (!) 120  Resp:  15 (!) 23 (!) 22  Temp: (!) 102.8 F (39.3 C)   99.7 F (37.6 C)  TempSrc:  Rectal   Oral  SpO2:  97% 92% 100%    General: 70 y.o. ill appearing female resting in bed ENMT: Nares patent w/o discharge, orophaynx clear, dentition normal, ears w/o discharge/lesions/ulcers Cardiovascular: tachy, +S1, S2, no m/g/r, equal pulses throughout Respiratory:decreased at bases, no w/r/r, normal WOB GI: BS+, NDNT, no masses noted, no organomegaly noted MSK: No e/c/c Neuro: mumbling, agitated, not oriented, does not follow commands  Labs on Admission: I have personally reviewed following labs and imaging studies  CBC: Recent Labs  Lab 07/05/20 1018 07/05/20 1227  WBC 7.0 8.1  NEUTROABS 3.6 3.4  HGB 11.3* 11.6*  HCT 36.8 37.8  MCV 98.4 99.7  PLT 237 161   Basic Metabolic Panel: Recent Labs  Lab 07/05/20 1018 07/05/20 1227  NA 140 141  K 4.4 4.5  CL 103 101  CO2 27 28  GLUCOSE 109* 115*  BUN 17 21  CREATININE 0.80 0.89  CALCIUM 9.6 9.8   GFR: CrCl cannot be calculated (Unknown ideal weight.). Liver Function Tests: Recent Labs  Lab 07/05/20 1018 07/05/20 1227  AST 21 29  ALT 25 26  ALKPHOS 70 67  BILITOT 0.5 0.8  PROT 7.1 8.1  ALBUMIN 3.3* 3.8   No results for input(s): LIPASE, AMYLASE in the last 168 hours. No results for input(s): AMMONIA in the last 168 hours. Coagulation Profile: No results for input(s): INR, PROTIME in the last 168 hours. Cardiac Enzymes: No results for input(s): CKTOTAL, CKMB, CKMBINDEX, TROPONINI in the last 168 hours. BNP (last 3 results) No results for input(s): PROBNP in the last 8760 hours. HbA1C: No results for input(s): HGBA1C in the last 72 hours. CBG: Recent Labs  Lab 07/05/20 1216  GLUCAP 87   Lipid Profile: No results for input(s): CHOL, HDL, LDLCALC, TRIG, CHOLHDL, LDLDIRECT in the last 72 hours. Thyroid Function Tests: No results for input(s): TSH, T4TOTAL, FREET4, T3FREE, THYROIDAB in the last 72 hours. Anemia Panel: No results for input(s): VITAMINB12, FOLATE, FERRITIN, TIBC, IRON, RETICCTPCT in  the last 72 hours. Urine analysis:    Component Value Date/Time   COLORURINE YELLOW 06/15/2020 [place] 06/15/2020 1720   LABSPEC 1.019 06/15/2020 1720   PHURINE 6.0 06/15/2020 1720   GLUCOSEU NEGATIVE 06/15/2020 1720   HGBUR NEGATIVE 06/15/2020 1720   BILIRUBINUR NEGATIVE 06/15/2020 1720   KETONESUR NEGATIVE 06/15/2020 1720   PROTEINUR 30 (A) 06/15/2020 1720   NITRITE NEGATIVE 06/15/2020 1720   LEUKOCYTESUR NEGATIVE 06/15/2020 1720    Radiological Exams on Admission: MR Brain W and Wo Contrast  Result Date: 07/05/2020 CLINICAL DATA:  Altered mental status, glioblastoma EXAM: MRI HEAD WITHOUT AND WITH CONTRAST TECHNIQUE: Multiplanar, multiecho pulse sequences of the brain and surrounding structures were obtained without and with intravenous contrast. CONTRAST:  25mL GADAVIST GADOBUTROL 1 MMOL/ML IV SOLN COMPARISON:  May 14, 2020 FINDINGS: Postcontrast imaging is motion degraded. Brain: There is increased enhancement about the left parietal resection cavity extending contralaterally across the splenium of the corpus callosum. Nodular enhancement near midline is again identified and is likely similar. There is also a new enhancement along the right frontal horn. Extent of abnormal T2 FLAIR hyperintensity has increased in these areas and adjacent white matter for example within the left occipitotemporal lobes. There is nonspecific linear diffusion hyperintensity without restriction at the right dorsal aspect of the medulla extending into the brachium pontis. May reflect sequelae of interval infarction and/or wallerian degeneration. There is no significant mass effect.  No hydrocephalus. Vascular: Major vessel flow voids at the skull base are preserved. Skull and upper cervical spine: Normal marrow signal. Left parietal craniotomy. Sinuses/Orbits: Increased lobular right sphenoid sinus mucosal thickening. Otherwise minor paranasal sinus mucosal thickening. Left lens replacement.  Other: Sella is unremarkable.  Increased right mastoid effusion. IMPRESSION: Suboptimal valuation. In particular, postcontrast imaging is significantly motion degraded. Progression of enhancement and abnormal T2 FLAIR hyperintensity. Given discontiguous involvement along the right frontal horn, progression of disease is likely. Electronically Signed   By: Macy Mis M.D.   On: 07/05/2020 14:32   Assessment/Plan Sepsis secondary to COVID 19 Acute metabolic encephalopathy Cognitive Decline Gliobastoma s/p rescection HTN HLD Falls Seizures Zoster infection     - admit to inpt, med-surg     - Patient altered after visit w/ onco today. She is continuing  to decline with several insults to her health over the last several months. Onco does not believe that she would be a candidate for any further treatment of her GBM. EDP, palliative care and onco discussed situation with family. They are electing hospice at this time.      - She is found to be COVID+. Meets sepsis criteria with my only source at this point being COVID. However, family has elected hospice. Will place in isolation, med-surg w/ comfort care measures. Palliative care to see in AM. Working on residential hospice options.   DVT prophylaxis: None, comfort care measures  Code Status: DNR  Family Communication: Spoke with [name] by phone  Consults called: EDP spoke with Palliative Care/Hospice, Oncology   Status is: Inpatient  Remains inpatient appropriate because:Inpatient level of care appropriate due to severity of illness   Dispo: The patient is from: Home              Anticipated d/c is to: Hospice              Anticipated d/c date is: 1 day              Patient currently is not medically stable to d/c.   Difficult to place patient No   Jonnie Finner DO [facility]  If 7PM-7AM, please contact night-coverage [email or web address]  07/05/2020, 4:29 PM

## 2020-07-05 NOTE — ED Notes (Signed)
Patient transported to MRI, will continue vital signs when patient returns

## 2020-07-05 NOTE — ED Provider Notes (Signed)
[facility] DEPT Provider Note   CSN: [id number] Arrival date & time: 07/05/20  1213     History Chief Complaint  Patient presents with  . Not Responsive    Wanda Ross is a 70 y.o. female.  HPI Patient presents after being found unresponsive, seemingly after having had outpatient visit in our cancer center. Patient cannot provide any details of her history, level 5 caveat secondary to acuity of condition. Per report the patient was at a visit, and was unresponsive.  No family is currently present, some details are available via chart review, and the patient seemingly had oncology visit for glioblastoma therapy.  She is seemingly on a pause from her radiation therapy due to ongoing zoster infection.    Past Medical History:  Diagnosis Date  . Glaucoma   . Hyperlipidemia 2010  . Hypertension 1990  . IGT (impaired glucose tolerance) 05/11/2010  . Multinodular goiter 2010   normal function    Patient Active Problem List   Diagnosis Date Noted  . Post herpetic neuralgia 06/15/2020  . Sepsis ([place]) 06/15/2020  . CAP (community acquired pneumonia) 06/15/2020  . Acute metabolic encephalopathy [id number]  . Pleural effusion on right 06/15/2020  . Elevated d-dimer 06/15/2020  . Zoster with other complications [id number]  . Herpes zoster 05/22/2020  . Acute respiratory failure with hypoxia ([place]) 05/14/2020  . Seizures ([place]) 05/14/2020  . Thrombocytopenia ([place]) 05/14/2020  . Lactic acidosis 05/14/2020  . Hypokalemia 05/14/2020  . Hyperglycemia 05/14/2020  . Pressure injury of skin 05/14/2020  . Goals of care, counseling/discussion 12/15/2019  . Glioblastoma ([place])   . Hypoalbuminemia due to protein-calorie malnutrition ([place])   . Transaminitis   . Benign essential HTN   . Prediabetes   . Seizure prophylaxis   . Glioma ([place]) 12/02/2019  . Right sided weakness   . Dyslipidemia   . Essential hypertension   . Steroid-induced hyperglycemia    . Leucocytosis   . Brain mass 11/22/2019  . 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 Abnormal electrocardiogram (ECG) (EKG) 12/31/2017  . White coat syndrome with diagnosis of hypertension 12/31/2017  . Glaucoma 12/26/2013  . Vitamin D deficiency 05/27/2009  . IMPAIRED GLUCOSE TOLERANCE 05/27/2009  . OSTEOPENIA 05/27/2009  . Multinodular goiter 2010  . Multiple thyroid nodules 11/06/2007  . Hyperlipidemia 11/06/2007  . 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 APPLICATION OF CRANIAL NAVIGATION Left 11/26/2019   Procedure: APPLICATION OF CRANIAL NAVIGATION;  Surgeon: [name], MD;  Location: [facility];  Service: Neurosurgery;  Laterality: Left;  APPLICATION OF CRANIAL NAVIGATION  . COLONOSCOPY N/A 06/16/2016   Procedure: COLONOSCOPY;  Surgeon: Danie Binder, MD;  [facility];  Service: Endoscopy;  Laterality: N/A;  9:30 AM  . COLONOSCOPY N/A 10/22/2019   Procedure: COLONOSCOPY;  Surgeon: Daneil Dolin, MD;  [facility];  Service: Endoscopy;  Laterality: N/A;  9:30  . CRANIOTOMY Left 11/26/2019   Procedure: LEFT PARIETAL CRANIOTOMY FOR RESECTION OF TUMOR;  Surgeon: [name], MD;  Location: [facility];  Service: Neurosurgery;  Laterality: Left;  LEFT PARIETAL CRANIOTOMY FOR RESECTION OF TUMOR  . EYE SURGERY  2017   3 on left eye 1 on right eye for glaucoma by Dr. Venetia Maxon   . EYE SURGERY  2018   Cataract Surgery by Dr. Arlina Robes   . OPERATIVE ULTRASOUND N/A 11/26/2019   Procedure: OPERATIVE ULTRASOUND;  Surgeon: [name], MD;  Location: [facility];  Service: Neurosurgery;  Laterality: N/A;  OPERATIVE ULTRASOUND     OB History   No obstetric history on file.     Family History  Problem Relation Age of Onset  . Dementia Father   . Dementia Mother   . Diabetes Mother   . Hypertension Mother   . Breast cancer Sister   . Hypertension Sister   . Diabetes Brother     Social History   Tobacco Use  .  Smoking status: Never Smoker  . Smokeless tobacco: Never Used  Vaping Use  . Vaping Use: Never used  Substance Use Topics  . Alcohol use: No  . Drug use: No    Home Medications Prior to Admission medications   Medication Sig Start Date End Date Taking? Authorizing Provider  acetaminophen (TYLENOL) 325 MG tablet Take 2 tablets (650 mg total) by mouth every 4 (four) hours as needed for mild pain (temp > 100.5). 12/02/19   Guilford Shi, MD  alendronate (FOSAMAX) 70 MG tablet TAKE 1 TABLET (70 MG TOTAL) BY MOUTH EVERY 7 (SEVEN) DAYS. TAKE WITH A FULL GLASS OF WATER ON AN EMPTY STOMACH. Patient taking differently: Take 70 mg by mouth once a week. 06/15/20   Fayrene Helper, MD  amLODipine (NORVASC) 5 MG tablet Take 1 tablet (5 mg total) by mouth daily. 12/09/19   Angiulli, Lavon Paganini, PA-C  Calcium Carbonate-Vit D-Min (CALCIUM 1200) 1200-1000 MG-UNIT CHEW Chew 1,200 mg by mouth daily. 12/09/19   Angiulli, Lavon Paganini, PA-C  cloNIDine (CATAPRES) 0.3 MG tablet TAKE 1 TABLET BY MOUTH EVERYDAY AT BEDTIME Patient taking differently: Take 0.3 mg by mouth at bedtime. 12/09/19   Angiulli, Lavon Paganini, PA-C  Cyanocobalamin 2500 MCG TABS Take 2,500 mcg by mouth daily. Patient taking differently: Take 2,500 mcg by mouth at bedtime. 12/09/19   Angiulli, Lavon Paganini, PA-C  DENTA 5000 PLUS 1.1 % CREA dental cream Take 1 application by mouth daily. 06/09/20   [provider]  dexamethasone (DECADRON) 1 MG tablet Take 1 tablet (1 mg total) by mouth daily. 06/15/20   Ventura Sellers, MD  gabapentin (NEURONTIN) 300 MG capsule Take 2 capsules (600 mg total) by mouth 3 (three) times daily. 06/10/20   Ventura Sellers, MD  levETIRAcetam (KEPPRA) 1000 MG tablet Take 1 tablet (1,000 mg total) by mouth 2 (two) times daily. 05/19/20   Aline August, MD  lidocaine (XYLOCAINE) 5 % ointment Apply 1 application topically as needed. 06/11/20   Ventura Sellers, MD  metoprolol tartrate (LOPRESSOR) 25 MG tablet Take 0.5  tablets (12.5 mg total) by mouth 2 (two) times daily. 06/21/20 07/21/20  Darliss Cheney, MD  Multiple Vitamins-Minerals (CENTRUM SILVER ULTRA WOMENS) TABS Take 1 tablet by mouth daily.    [provider]  ondansetron (ZOFRAN) 8 MG tablet Take 1 tablet (8 mg total) by mouth 2 (two) times daily as needed (nausea and vomiting). May take 30-60 minutes prior to Temodar administration if nausea/vomiting occurs. 03/08/20   Vaslow, Acey Lav, MD  potassium chloride (KLOR-CON) 10 MEQ tablet Take 1 tablet (10 mEq total) by mouth daily. 06/01/20   Noreene Larsson, NP  pravastatin (PRAVACHOL) 10 MG tablet Take 1 tablet (10 mg total) by mouth at bedtime. 05/19/20   Aline August, MD  temozolomide (TEMODAR) 180 MG capsule Take 2 capsules (360 mg total) by mouth daily. May take on an empty stomach to decrease nausea & vomiting. 06/15/20   Ventura Sellers, MD  UNABLE TO FIND Med Name: HB Otic  Drops (hydrocortisone 1%,benzocaine 2%) Sig: Fill ear canal with 3-4 drops up to 3 to 4 times daily #15 mL 06/02/20   Noreene Larsson, NP    Allergies    Patient has no known allergies.  Review of Systems   Review of Systems  Unable to perform ROS: Acuity of condition    Physical Exam Updated Vital Signs There were no vitals taken for this visit.  Physical Exam Vitals and nursing note reviewed.  Constitutional:      Appearance: She is well-developed and well-nourished. She is ill-appearing.     Comments: Unresponsive female who does offer verbal reaction to painful stimuli, otherwise noninteractive  HENT:     Head: Normocephalic and atraumatic.   Eyes:     Extraocular Movements: EOM normal.     Conjunctiva/sclera: Conjunctivae normal.  Cardiovascular:     Rate and Rhythm: Normal rate and regular rhythm.  Pulmonary:     Effort: Pulmonary effort is normal. No respiratory distress.     Breath sounds: Normal breath sounds. No stridor.  Abdominal:     General: There is no distension.  Musculoskeletal:         General: No edema.  Skin:    General: Skin is warm and dry.  Neurological:     Cranial Nerves: No cranial nerve deficit.     Comments: React to painful stimuli only, areflexic in distal lower extremities  Psychiatric:        Mood and Affect: Mood and affect normal.        Cognition and Memory: Cognition is impaired. Memory is impaired.      ED Results / Procedures / Treatments   Labs (all labs ordered are listed, but only abnormal results are displayed) Labs Reviewed  SARS CORONAVIRUS 2 BY RT PCR ([facility]) - Abnormal; Notable for the following components:      Result Value   SARS Coronavirus 2 POSITIVE (*)    All other components within normal limits  COMPREHENSIVE METABOLIC PANEL - Abnormal; Notable for the following components:   Glucose, Bld 115 (*)    All other components within normal limits  CBC WITH DIFFERENTIAL/PLATELET - Abnormal; Notable for the following components:   RBC 3.79 (*)    Hemoglobin 11.6 (*)    RDW 18.9 (*)    All other components within normal limits  ETHANOL   Radiology MR Brain W and Wo Contrast  Result Date: 07/05/2020 CLINICAL DATA:  Altered mental status, glioblastoma EXAM: MRI HEAD WITHOUT AND WITH CONTRAST TECHNIQUE: Multiplanar, multiecho pulse sequences of the brain and surrounding structures were obtained without and with intravenous contrast. CONTRAST:  7mL GADAVIST GADOBUTROL 1 MMOL/ML IV SOLN COMPARISON:  May 14, 2020 FINDINGS: Postcontrast imaging is motion degraded. Brain: There is increased enhancement about the left parietal resection cavity extending contralaterally across the splenium of the corpus callosum. Nodular enhancement near midline is again identified and is likely similar. There is also a new enhancement along the right frontal horn. Extent of abnormal T2 FLAIR hyperintensity has increased in these areas and adjacent white matter for example within the left occipitotemporal  lobes. There is nonspecific linear diffusion hyperintensity without restriction at the right dorsal aspect of the medulla extending into the brachium pontis. May reflect sequelae of interval infarction and/or wallerian degeneration. There is no significant mass effect.  No hydrocephalus. Vascular: Major vessel flow voids at the skull base are preserved. Skull and upper cervical spine: Normal marrow signal. Left  parietal craniotomy. Sinuses/Orbits: Increased lobular right sphenoid sinus mucosal thickening. Otherwise minor paranasal sinus mucosal thickening. Left lens replacement. Other: Sella is unremarkable.  Increased right mastoid effusion. IMPRESSION: Suboptimal valuation. In particular, postcontrast imaging is significantly motion degraded. Progression of enhancement and abnormal T2 FLAIR hyperintensity. Given discontiguous involvement along the right frontal horn, progression of disease is likely. Electronically Signed   By: Macy Mis M.D.   On: 07/05/2020 14:32    Procedures Procedures   Medications Ordered in ED Medications  levETIRAcetam (KEPPRA) IVPB 1000 mg/100 mL premix (0 mg Intravenous Stopped 07/05/20 1249)  acetaminophen (TYLENOL) suppository 650 mg (has no administration in time range)  gadobutrol (GADAVIST) 1 MMOL/ML injection 5 mL (5 mLs Intravenous Contrast Given 07/05/20 1349)    ED Course  I have reviewed the triage vital signs and the nursing notes.  Pertinent labs & imaging results that were available during my care of the patient were reviewed by me and considered in my medical decision making (see chart for details).     With consideration of the patient's Hx of glioblastoma, seizures, she was placed on continuous monitoring (O2- 97%RA, nml, cardiac 80's sinus, unremarkable)  IV access was obtained, Keppra loading started, and the patient was prepped for MRI.   EMR review - notes from earlier today: Interval History:  ADRI MELCHOR presents today for follow up  after recent hospitalization for herpes zoster.  She continues to recover with regards to weeping and crusting of lesions, which are confined to lower aspect of face on the left side.  She does complain of some burning pain in the face and also the inner ear.  Completed valacyclovir treatment over the weekend, no compliaction.  Fortunately continues to be seizure free since 12/17 event.  No new or progressive deficits, denies headaches.  Fatigue is stable from prior.  Assessment/Plan No primary diagnosis found.   [name] is clinically improving today, still recovering from zoster reactivation affecting R V3 distribution.  She has completed anti-viral therapy.   Once fully recovered from her infection, we will recommend continuing treatment with cycle #3 Temozolomide dose reduced to 150 mg/m2, on for five days and off for twenty three days in twenty eight day cycles. The patient will have a complete blood count performed on days 21 and 28 of each cycle, and a comprehensive metabolic panel performed on day 28 of each cycle. Labs may need to be performed more often. Zofran will prescribed for home use for nausea/vomiting.   Dose reduction is recommended due to thrombocytopenia, now improved.     Cycle #3 should not begin until further improvement is made with facial rash, and steroids are more fully weaned, given potential culpability in zoster reactivation.   Update: Patient hemodynamically unchanged.  She has tolerated MRI somewhat, study is poor.  Patient was found to have Covid positive result.  Patient's MRI suggests progression of disease.  After obtaining results I discussed them with her oncologist. In particular we discussed patient's recent decline in status, and with concern for progression of disease, concurrent Covid infection, Dr. Mickeal Skinner notes that he spoke with family about goals of care earlier in the day, and the patient's prognosis is poor. Subsequently discussed the  patient's results from this afternoon with her daughter, and family members.  With consideration of progression of disease, COVID status, declining overall functionality, they have a preference for enrollment in hospice/palliative care.  Update: I discussed patient case with her hospice colleagues.  With elderly female  with glioblastoma, recent pause in treatment secondary to multiple infections including recent zoster now presents after likely seizure following outpatient eval.  With concern for ongoing seizure activity the patient had Keppra loading IV infusion, labs, monitoring.  MRI reviewed, notable for likely progression of disease, and with this, and as above, Covid positive results, I discussed her case with family members, oncology and our hospice and internal medicine colleagues for initiation of hospice services. Final Clinical Impression(s) / ED Diagnoses Final diagnoses:  Unresponsiveness  COVID  Glioblastoma ([place])   MDM Number of Diagnoses or Management Options COVID: new, needed workup Glioblastoma ([place]): new, needed workup Unresponsiveness: new, needed workup   Amount and/or Complexity of Data Reviewed Clinical lab tests: reviewed Tests in the radiology section of CPT: reviewed Tests in the medicine section of CPT: reviewed Discussion of test results with the performing providers: yes Decide to obtain previous medical records or to obtain history from someone other than the patient: yes Obtain history from someone other than the patient: yes Review and summarize past medical records: yes Discuss the patient with other providers: yes Independent visualization of images, tracings, or specimens: yes  Risk of Complications, Morbidity, and/or Mortality Presenting problems: high Diagnostic procedures: high Management options: high  Critical Care Total time providing critical care: 30-74 minutes (40)  Patient Progress Patient progress: stable    Carmin Muskrat,  MD 07/05/20 1549

## 2020-07-05 NOTE — ED Notes (Signed)
ED TO INPATIENT HANDOFF REPORT  Name/Age/Gender Wanda Ross 70 y.o. female  Code Status    Code Status Orders  (From admission, onward)         Start     Ordered   07/05/20 1711  Do not attempt resuscitation (DNR)  Continuous       Question Answer Comment  In the event of cardiac or respiratory ARREST Do not call a "code blue"   In the event of cardiac or respiratory ARREST Do not perform Intubation, CPR, defibrillation or ACLS   In the event of cardiac or respiratory ARREST Use medication by any route, position, wound care, and other measures to relive pain and suffering. May use oxygen, suction and manual treatment of airway obstruction as needed for comfort.   Comments Confirmed with dtr by phone      07/05/20 1710        Code Status History    Date Active Date Inactive Code Status Order ID Comments User Context   07/05/2020 1534 07/05/2020 1710 DNR [id number]  Gerhard Munch, MD ED   06/15/2020 2042 06/21/2020 2220 Full Code [id number]  Frankey Shown, DO ED   05/22/2020 1436 05/25/2020 2256 Full Code [id number]  Cleora Fleet, MD ED   05/14/2020 0358 05/14/2020 1005 Full Code [id number]  Frankey Shown, DO ED   12/02/2019 1740 12/10/2019 2128 Full Code [id number]  Charlton Amor, PA-C Inpatient   12/02/2019 1740 12/02/2019 1740 Full Code [id number]  Charlton Amor, PA-C Inpatient   11/22/2019 0358 12/02/2019 1603 Full Code [id number]  Meredeth Ide, MD ED   Advance Care Planning Activity      Home/SNF/Other Home  Chief Complaint Sepsis [facility]) [A41.9]  Level of Care/Admitting Diagnosis ED Disposition    ED Disposition Condition Comment   Admit  Hospital Area: [facility] [[id number]]  Level of Care: Med-Surg [16]  May admit patient to [facility] or [facility] if equivalent level of care is available:: No  Covid Evaluation: Confirmed COVID Positive  Diagnosis: Sepsis [facility]) [[id number]]  Admitting Physician: Teddy Spike [[id number]]  Attending  Physician: Teddy Spike [[id number]]  Estimated length of stay: past midnight tomorrow  Certification:: I certify this patient will need inpatient services for at least 2 midnights       Medical History Past Medical History:  Diagnosis Date  . Glaucoma   . Hyperlipidemia 2010  . Hypertension 1990  . IGT (impaired glucose tolerance) 05/11/2010  . Multinodular goiter 2010   normal function    Allergies No Known Allergies  IV Location/Drains/Wounds Patient Lines/Drains/Airways Status    Active Line/Drains/Airways    Name Placement date Placement time Site Days   Peripheral IV 07/05/20 Left Antecubital 07/05/20  1230  Antecubital  less than 1   Incision (Closed) 11/26/19 Head Left 11/26/19  1205  -- 222   Pressure Injury 05/14/20 Coccyx Medial Stage 3 -  Full thickness tissue loss. Subcutaneous fat may be visible but bone, tendon or muscle are NOT exposed. One larger stage 3 with smaller stage threes around. 05/14/20  0700  -- 52          Labs/Imaging Results for orders placed or performed during the hospital encounter of 07/05/20 (from the past 48 hour(s))  Comprehensive metabolic panel     Status: Abnormal   Collection Time: 07/05/20 12:27 PM  Result Value Ref Range   Sodium 141 135 - 145 mmol/L   Potassium 4.5 3.5 - 5.1 mmol/L  Chloride 101 98 - 111 mmol/L   CO2 28 22 - 32 mmol/L   Glucose, Bld 115 (H) 70 - 99 mg/dL    Comment: Glucose reference range applies only to samples taken after fasting for at least 8 hours.   BUN 21 8 - 23 mg/dL   Creatinine, Ser 0.89 0.44 - 1.00 mg/dL   Calcium 9.8 8.9 - 10.3 mg/dL   Total Protein 8.1 6.5 - 8.1 g/dL   Albumin 3.8 3.5 - 5.0 g/dL   AST 29 15 - 41 U/L   ALT 26 0 - 44 U/L   Alkaline Phosphatase 67 38 - 126 U/L   Total Bilirubin 0.8 0.3 - 1.2 mg/dL   GFR, Estimated >60 >60 mL/min    Comment: (NOTE) Calculated using the CKD-EPI Creatinine Equation (2021)    Anion gap 12 5 - 15    Comment: Performed at [facility], [place] [address], [place], [place] [address]  Ethanol     Status: None   Collection Time: 07/05/20 12:27 PM  Result Value Ref Range   Alcohol, Ethyl (B) <10 <10 mg/dL    Comment: (NOTE) Lowest detectable limit for serum alcohol is 10 mg/dL.  For medical purposes only. Performed at [facility], [place] [address], [place], [place] [address]   CBC with Differential     Status: Abnormal   Collection Time: 07/05/20 12:27 PM  Result Value Ref Range   WBC 8.1 4.0 - 10.5 K/uL   RBC 3.79 (L) 3.87 - 5.11 MIL/uL   Hemoglobin 11.6 (L) 12.0 - 15.0 g/dL   HCT 37.8 36.0 - 46.0 %   MCV 99.7 80.0 - 100.0 fL   MCH 30.6 26.0 - 34.0 pg   MCHC 30.7 30.0 - 36.0 g/dL   RDW 18.9 (H) 11.5 - 15.5 %   Platelets 230 150 - 400 K/uL   nRBC 0.0 0.0 - 0.2 %   Neutrophils Relative % 42 %   Neutro Abs 3.4 1.7 - 7.7 K/uL   Lymphocytes Relative 49 %   Lymphs Abs 3.9 0.7 - 4.0 K/uL   Monocytes Relative 9 %   Monocytes Absolute 0.7 0.1 - 1.0 K/uL   Eosinophils Relative 0 %   Eosinophils Absolute 0.0 0.0 - 0.5 K/uL   Basophils Relative 0 %   Basophils Absolute 0.0 0.0 - 0.1 K/uL   Immature Granulocytes 0 %   Abs Immature Granulocytes 0.03 0.00 - 0.07 K/uL    Comment: Performed at [facility], [place] [address], [place], [place] [address]  SARS Coronavirus 2 by RT PCR (hospital order, performed in [facility] hospital lab) Nasopharyngeal Nasopharyngeal Swab     Status: Abnormal   Collection Time: 07/05/20  1:09 PM   Specimen: Nasopharyngeal Swab  Result Value Ref Range   SARS Coronavirus 2 POSITIVE (A) NEGATIVE    Comment: RESULT CALLED TO, READ BACK BY AND VERIFIED WITH: DOWD,P. RN @1436  ON 02.07.2022 BY COHEN,K (NOTE) SARS-CoV-2 target nucleic acids are DETECTED  SARS-CoV-2 RNA is generally detectable in upper respiratory specimens  during the acute phase of infection.  Positive results are indicative  of the presence of the identified virus, but do not rule  out bacterial infection or co-infection with other pathogens not detected by the test.  Clinical correlation with patient history and  other diagnostic information is necessary to determine patient infection status.  The expected result is negative.  Fact Sheet for Patients:   StrictlyIdeas.no   Fact  Sheet for Healthcare Providers:   BankingDealers.co.za    This test is not yet approved or cleared by the [place] and  has been authorized for detection and/or diagnosis of SARS-CoV-2 by FDA under an Emergency Use Authorization (EUA).  This EUA will remain in effect (meaning  this test can be used) for the duration of  the COVID-19 declaration under Section 564(b)(1) of the Act, 21 U.S.C. section 360-bbb-3(b)(1), unless the authorization is terminated or revoked sooner.  Performed at [facility], [place] [address] [place], [place] [address]    MR Brain W and Wo Contrast  Result Date: 07/05/2020 CLINICAL DATA:  Altered mental status, glioblastoma EXAM: MRI HEAD WITHOUT AND WITH CONTRAST TECHNIQUE: Multiplanar, multiecho pulse sequences of the brain and surrounding structures were obtained without and with intravenous contrast. CONTRAST:  32mL GADAVIST GADOBUTROL 1 MMOL/ML IV SOLN COMPARISON:  May 14, 2020 FINDINGS: Postcontrast imaging is motion degraded. Brain: There is increased enhancement about the left parietal resection cavity extending contralaterally across the splenium of the corpus callosum. Nodular enhancement near midline is again identified and is likely similar. There is also a new enhancement along the right frontal horn. Extent of abnormal T2 FLAIR hyperintensity has increased in these areas and adjacent white matter for example within the left occipitotemporal lobes. There is nonspecific linear diffusion hyperintensity without restriction at the right dorsal aspect of the medulla extending into the  brachium pontis. May reflect sequelae of interval infarction and/or wallerian degeneration. There is no significant mass effect.  No hydrocephalus. Vascular: Major vessel flow voids at the skull base are preserved. Skull and upper cervical spine: Normal marrow signal. Left parietal craniotomy. Sinuses/Orbits: Increased lobular right sphenoid sinus mucosal thickening. Otherwise minor paranasal sinus mucosal thickening. Left lens replacement. Other: Sella is unremarkable.  Increased right mastoid effusion. IMPRESSION: Suboptimal valuation. In particular, postcontrast imaging is significantly motion degraded. Progression of enhancement and abnormal T2 FLAIR hyperintensity. Given discontiguous involvement along the right frontal horn, progression of disease is likely. Electronically Signed   By: Macy Mis M.D.   On: 07/05/2020 14:32    Pending Labs Unresulted Labs (From admission, onward)         None      Vitals/Pain Today's Vitals   07/05/20 1930 07/05/20 2000 07/05/20 2030 07/05/20 2100  BP: 99/69 110/78 105/68 (!) 120/91  Pulse: (!) 111 (!) 113 (!) 112 (!) 117  Resp:   20   Temp:      TempSrc:      SpO2: 100% 100% 100% 100%  PainSc:        Isolation Precautions Airborne and Contact precautions  Medications Medications  acetaminophen (TYLENOL) tablet 650 mg (has no administration in time range)    Or  acetaminophen (TYLENOL) suppository 650 mg (has no administration in time range)  haloperidol (HALDOL) tablet 0.5 mg (has no administration in time range)    Or  haloperidol (HALDOL) 2 MG/ML solution 0.5 mg (has no administration in time range)    Or  haloperidol lactate (HALDOL) injection 0.5 mg (has no administration in time range)  ondansetron (ZOFRAN-ODT) disintegrating tablet 4 mg (has no administration in time range)    Or  ondansetron (ZOFRAN) injection 4 mg (has no administration in time range)  glycopyrrolate (ROBINUL) tablet 1 mg (has no administration in time range)     Or  glycopyrrolate (ROBINUL) injection 0.2 mg (has no administration in time range)    Or  glycopyrrolate (ROBINUL) injection 0.2 mg (has no  administration in time range)  antiseptic oral rinse (BIOTENE) solution 15 mL (has no administration in time range)  polyvinyl alcohol (LIQUIFILM TEARS) 1.4 % ophthalmic solution 1 drop (has no administration in time range)  morphine 2 MG/ML injection 1 mg (has no administration in time range)  LORazepam (ATIVAN) injection 1 mg (has no administration in time range)  gadobutrol (GADAVIST) 1 MMOL/ML injection 5 mL (5 mLs Intravenous Contrast Given 07/05/20 1349)    Mobility walks

## 2020-07-05 NOTE — ED Notes (Signed)
Patient still in MRI at this time

## 2020-07-06 DIAGNOSIS — R6520 Severe sepsis without septic shock: Secondary | ICD-10-CM | POA: Diagnosis not present

## 2020-07-06 DIAGNOSIS — A419 Sepsis, unspecified organism: Secondary | ICD-10-CM | POA: Diagnosis not present

## 2020-07-06 DIAGNOSIS — G9340 Encephalopathy, unspecified: Secondary | ICD-10-CM | POA: Diagnosis not present

## 2020-07-06 MED ORDER — METOPROLOL TARTRATE 12.5 MG HALF TABLET
12.5000 mg | ORAL_TABLET | Freq: Two times a day (BID) | ORAL | Status: DC
  Administered 2020-07-06 – 2020-07-07 (×4): 12.5 mg via ORAL
  Filled 2020-07-06 (×4): qty 1

## 2020-07-06 MED ORDER — LEVETIRACETAM 500 MG PO TABS
1000.0000 mg | ORAL_TABLET | Freq: Two times a day (BID) | ORAL | Status: DC
  Administered 2020-07-06 (×2): 1000 mg via ORAL
  Filled 2020-07-06 (×2): qty 2

## 2020-07-06 NOTE — Plan of Care (Signed)

## 2020-07-06 NOTE — TOC Initial Note (Signed)
Transition of Care [facility]) - Initial/Assessment Note    Patient Details  Name: Wanda Ross MRN: [id number] Date of Birth: 10/03/1950  [facility] CM/SW Contact:    Trish Mage, LCSW Phone Number: 07/06/2020, 3:12 PM  Clinical Narrative:   Followed up with daughter Keionna Kinnaird to talk about plan going forward after finding out that [facility] is not taking Covid+ patients into their hospice facility.  [name] got her brother Darcus Austin on the phone, and I confirmed with them that they are wanting their mother to go to a hospice facility.  Prior to this hospitalization, [name] had been at [facility] getting short term rehab following a hospitalization at [facility].  Before that, she had been living at home, with family taking turns being with her around the clock.  [name] and Darcus Austin both agree their first choice is their mother go to a hospice facility.  I explained that [facility] is the only one currently taking COVID+ patients, and those facilities are in [place] and [place].  They voiced understanding, and asked me to proceed.  I contacted Cheri at same, and made the referral.  They will review for medical eligibility. I will then review options with family.  TOC will continue to follow during the course of hospitalization.                Expected Discharge Plan: [place] Barriers to Discharge: Hospice Bed not available   Patient Goals and CMS Choice     Choice offered to / list presented to : Adult Children  Expected Discharge Plan and Services Expected Discharge Plan: [place] In-house Referral: Clinical Social Work   Post Acute Care Choice: Hospice Living arrangements for the past 2 months: [facility] (since 06/21/20)                                      Prior Living Arrangements/Services Living arrangements for the past 2 months: [facility] (since 06/21/20) Lives with::  Facility Resident Patient language and need for interpreter reviewed:: Yes        Need for Family Participation in Patient Care: Yes (Comment) Care giver support system in place?: Yes (comment)   Criminal Activity/Legal Involvement Pertinent to Current Situation/Hospitalization: No - Comment as needed  Activities of Daily Living Home Assistive Devices/Equipment: Environmental consultant (specify type) ADL Screening (condition at time of admission) Patient's cognitive ability adequate to safely complete daily activities?: No Is the patient deaf or have difficulty hearing?: No Does the patient have difficulty seeing, even when wearing glasses/contacts?: No Does the patient have difficulty concentrating, remembering, or making decisions?: Yes Patient able to express need for assistance with ADLs?: No Does the patient have difficulty dressing or bathing?: Yes Independently performs ADLs?: No Communication: Dependent Is this a change from baseline?: Change from baseline, expected to last >3 days Dressing (OT): Needs assistance Is this a change from baseline?: Pre-admission baseline Grooming: Needs assistance Is this a change from baseline?: Pre-admission baseline Feeding: Needs assistance Is this a change from baseline?: Pre-admission baseline Bathing: Needs assistance Is this a change from baseline?: Pre-admission baseline Toileting: Needs assistance Is this a change from baseline?: Pre-admission baseline In/Out Bed: Needs assistance Is this a change from baseline?: Pre-admission baseline Walks in Home: Needs assistance Is this a change from baseline?: Pre-admission baseline Does the patient have difficulty walking  or climbing stairs?: Yes Weakness of Legs: Both Weakness of Arms/Hands: None  Permission Sought/Granted Permission sought to share information with : Family Supports Permission granted to share information with : No  Share Information with NAME: Everli, Rother (Daughter)   [phone]            Emotional Assessment       Orientation: : Fluctuating Orientation (Suspected and/or reported Sundowners) Alcohol / Substance Use: Not Applicable Psych Involvement: No (comment)  Admission diagnosis:  Glioblastoma ([address]) [C71.9] Unresponsiveness [R41.89] Sepsis ([place]) [A41.9] COVID [U07.1] Patient Active Problem List   Diagnosis Date Noted  . Post herpetic neuralgia 06/15/2020  . Sepsis ([place]) 06/15/2020  . CAP (community acquired pneumonia) 06/15/2020  . Acute metabolic encephalopathy 14/43/1540  . Pleural effusion on right 06/15/2020  . Elevated d-dimer 06/15/2020  . Zoster with other complications 08/67/6195  . Herpes zoster 05/22/2020  . Acute respiratory failure with hypoxia ([place]) 05/14/2020  . Seizures ([place]) 05/14/2020  . Thrombocytopenia ([place]) 05/14/2020  . Lactic acidosis 05/14/2020  . Hypokalemia 05/14/2020  . Hyperglycemia 05/14/2020  . Pressure injury of skin 05/14/2020  . Goals of care, counseling/discussion 12/15/2019  . Glioblastoma ([place])   . Hypoalbuminemia due to protein-calorie malnutrition ([place])   . Transaminitis   . Benign essential HTN   . Prediabetes   . Seizure prophylaxis   . Glioma ([place]) 12/02/2019  . Right sided weakness   . Dyslipidemia   . Essential hypertension   . Steroid-induced hyperglycemia   . Leucocytosis   . Brain mass 11/22/2019  . 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 Abnormal electrocardiogram (ECG) (EKG) 12/31/2017  . White coat syndrome with diagnosis of hypertension 12/31/2017  . Glaucoma 12/26/2013  . Vitamin D deficiency 05/27/2009  . IMPAIRED GLUCOSE TOLERANCE 05/27/2009  . OSTEOPENIA 05/27/2009  . Multinodular goiter 2010  . Multiple thyroid nodules 11/06/2007  . Hyperlipidemia 11/06/2007  . 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 [place] [address] Phone: [phone] Fax:  [phone]     Social Determinants of Health (SDOH) Interventions    Readmission Risk Interventions Readmission Risk Prevention Plan 06/16/2020 05/24/2020  Transportation Screening Complete Complete  PCP or Specialist Appt within 3-5 Days - Complete  HRI or [place] - Complete  Social Work Consult for [place] Planning/Counseling - Complete  Palliative Care Screening - Complete  Medication Review Press photographer) Complete Complete  HRI or Home Care Consult Complete -  SW Recovery Care/Counseling Consult Complete -  Palliative Care Screening Not Applicable -  Some recent data might be hidden

## 2020-07-06 NOTE — Progress Notes (Signed)
PROGRESS NOTE  Wanda Ross  DOB: 03-01-1951  PCP: Fayrene Helper, MD MKL:[id number]  DOA: 07/05/2020  LOS: 1 day   Chief Complaint  Patient presents with  . Not Responsive   Brief narrative: Wanda Ross is a 70 y.o. female with PMH significant for glioblastoma, seizures, HTN, HLD. Patient was brought to the ED on 2/7 for altered mental status.  On 2/7, patient had an office visit with oncology Dr. Mickeal Skinner for failure to thrive.  As she was leaving the office, patient became unresponsive, family presumed she had a seizure and hence he was brought to the ED.  In the ED, patient had a temperature of 102.8 with tachycardia upto 120, breathing on roomair She was found to be Covid positive She was started on Keppra for presumed seizure Labs unremarkable. MRI brain showed progression of cancer. After discussion with oncologist, family chose comfort care measures Admitted to hospitalist service  Subjective: Patient was seen and examined this morning.  Pleasant elderly African-American female.  Lying on bed.  Oriented to place.  Not hurting at this time. No recurrence of fever.  Remains tachycardic to 110s No labs this morning  Assessment/Plan: Sepsis secondary to COVID-19 pneumonia - POA -Met criteria with fever, tachycardia.  COVID pneumonia -Presented with lethargy -COVID test: PCR positive in ED -Not started on specific treatment for Covid pneumonia because of comfort care status. -Continue supportive care. -Not requiring oxygen at this time.  Acute metabolic encephalopathy -Disorientation in the background of progressive physical and mental decline related to cancer -Comfort care status  Failure to thrive  -Related to progression of glioblastoma, and with a recent flurry of pneumonia, sepsis, just a reactivation and seizure.  Seizures -We will continue Keppra to avoid recurrence of seizure.  Other medical history : HTN, HLD, frequent falls, zoster  reactivation  Code Status:   Code Status: DNR  Nutritional status: Body mass index is 23.23 kg/m.     Diet Order    None      DVT prophylaxis: Comfort care status   Fluid: None  Consultants: None Family Communication:  None at bedside  Status is: Inpatient  Remains inpatient appropriate because: Comfort care status, pending hospice evaluation  Dispo: The patient is from: Home              Anticipated d/c is to: Home with hospice versus residential hospice              Anticipated d/c date is: Pending hospice input              Patient currently is medically stable to d/c.   Difficult to place patient Yes       Infusions:    Scheduled Meds: . levETIRAcetam  1,000 mg Oral BID  . metoprolol tartrate  12.5 mg Oral BID    Antimicrobials: Anti-infectives (From admission, onward)   None      PRN meds: acetaminophen **OR** acetaminophen, antiseptic oral rinse, glycopyrrolate **OR** glycopyrrolate **OR** glycopyrrolate, haloperidol **OR** haloperidol **OR** haloperidol lactate, LORazepam, morphine injection, ondansetron **OR** ondansetron (ZOFRAN) IV, polyvinyl alcohol   Objective: Vitals:   07/05/20 2100 07/06/20 0003  BP: (!) 120/91 123/64  Pulse: (!) 117 (!) 111  Resp:  19  Temp:  98.6 F (37 C)  SpO2: 100% 97%    Intake/Output Summary (Last 24 hours) at 07/06/2020 1015 Last data filed at 07/06/2020 0617 Gross per 24 hour  Intake 100 ml  Output 125 ml  Net -25 ml  Filed Weights   07/06/20 0003  Weight: 69.3 kg   Weight change:  Body mass index is 23.23 kg/m.   Physical Exam: General exam: Pleasant elderly female.  Not in distress. Skin: No rashes, lesions or ulcers. HEENT: Atraumatic, normocephalic, no obvious bleeding Lungs: Clear to auscultation bilaterally CVS: Tachycardic, no murmur GI/Abd soft, nontender, nondistended, bowel sound present CNS: Alert, awake, oriented to place and person Psychiatry: Mood appropriate Extremities: No pedal  edema, no calf tenderness  Data Review: I have personally reviewed the laboratory data and studies available.  Recent Labs  Lab 07/05/20 1018 07/05/20 1227  WBC 7.0 8.1  NEUTROABS 3.6 3.4  HGB 11.3* 11.6*  HCT 36.8 37.8  MCV 98.4 99.7  PLT 237 230   Recent Labs  Lab 07/05/20 1018 07/05/20 1227  NA 140 141  K 4.4 4.5  CL 103 101  CO2 27 28  GLUCOSE 109* 115*  BUN 17 21  CREATININE 0.80 0.89  CALCIUM 9.6 9.8    F/u labs not required  Signed, Terrilee Croak, MD [facility] 07/06/2020

## 2020-07-06 NOTE — Progress Notes (Signed)
Chaplain was able to speak with nurse.  Chaplain will follow-up with Wanda Ross's family to offer support.    07/06/20 1200  Clinical Encounter Type  Visited With Health care provider  Visit Type Initial

## 2020-07-07 DIAGNOSIS — R6520 Severe sepsis without septic shock: Secondary | ICD-10-CM | POA: Diagnosis not present

## 2020-07-07 DIAGNOSIS — G9340 Encephalopathy, unspecified: Secondary | ICD-10-CM | POA: Diagnosis not present

## 2020-07-07 DIAGNOSIS — A419 Sepsis, unspecified organism: Secondary | ICD-10-CM | POA: Diagnosis not present

## 2020-07-07 MED ORDER — GLYCOPYRROLATE 1 MG PO TABS: 1.0000 mg | ORAL_TABLET | ORAL | Status: AC | PRN

## 2020-07-07 MED ORDER — HALOPERIDOL 0.5 MG PO TABS: 0.5000 mg | ORAL_TABLET | ORAL | Status: AC | PRN

## 2020-07-07 MED ORDER — LEVETIRACETAM 100 MG/ML PO SOLN
1000.0000 mg | Freq: Two times a day (BID) | ORAL | Status: DC
  Administered 2020-07-07 (×2): 1000 mg via ORAL
  Filled 2020-07-07 (×2): qty 10

## 2020-07-07 MED ORDER — ONDANSETRON 4 MG PO TBDP: 4.0000 mg | ORAL_TABLET | Freq: Four times a day (QID) | ORAL | 0 refills | Status: AC | PRN

## 2020-07-07 NOTE — Discharge Summary (Signed)
Physician Discharge Summary  ROE KOFFMAN PZW:[id number] DOB: 1951-04-01 DOA: 07/05/2020  PCP: Fayrene Helper, MD  Admit date: 07/05/2020 Discharge date: 07/07/2020  Admitted From: Home Discharge disposition: Residential hospice   Code Status: DNR  Diet Recommendation: As tolerated  Discharge Diagnosis:   Active Problems:   Sepsis [facility])   Chief Complaint  Patient presents with  . Not Responsive   Brief narrative: Wanda Ross is a 70 y.o. female with PMH significant for glioblastoma, seizures, HTN, HLD. Patient was brought to the ED on 2/7 for altered mental status.  On 2/7, patient had an office visit with oncology Dr. Mickeal Skinner for failure to thrive.  As she was leaving the office, patient became unresponsive, family presumed she had a seizure and hence he was brought to the ED.  In the ED, patient had a temperature of 102.8 with tachycardia upto 120, breathing on roomair She was found to be Covid positive She was started on Keppra for presumed seizure Labs unremarkable. MRI brain showed progression of cancer. After discussion with oncologist, family chose comfort care measures Admitted to hospitalist service. Okay to discharge to residential hospice today  Subjective: Patient was seen and examined this morning.   Seems comfortable in bed.  Not in distress  Assessment/Plan: Sepsis secondary to COVID-19 pneumonia - POA -Met criteria with fever, tachycardia.  COVID pneumonia -Presented with lethargy -COVID test: PCR positive in ED -Not started on specific treatment for Covid pneumonia because of comfort care status. -Continue supportive care. -Not requiring oxygen at this time.  Acute metabolic encephalopathy -Disorientation in the background of progressive physical and mental decline related to cancer -Comfort care status  Failure to thrive  -Related to progression of glioblastoma, and with a recent flurry of pneumonia, sepsis, just a reactivation  and seizure.  Seizures -We will continue Keppra to avoid recurrence of seizure.  Other medical history : HTN, HLD, frequent falls, zoster reactivation  Okay to discharge to residential hospice today.  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 Subcutaneous fat may be visible but bone, tendon or muscle are NOT exposed. One larger stage 3 with smaller stage threes around. (Active)  Date First Assessed/Time First Assessed: 05/14/20 0700   Location: Coccyx  Location Orientation: Medial  Staging: Stage 3 -  Full thickness tissue loss. Subcutaneous fat may be visible but bone, tendon or muscle are NOT exposed.  Wound Description (Co...    Assessments 05/14/2020  7:00 AM 05/19/2020 10:00 AM  Dressing Type Foam - Lift dressing to assess site every shift --  Dressing Clean;Dry;Intact;Changed --  Dressing Change Frequency PRN --  State of Healing Early/partial granulation --  Site / Wound Assessment Clean;Dry;Pink --  % Wound base Red or Granulating 100% --  Peri-wound Assessment Erythema (blanchable);Intact --  Wound Length (cm) 3.5 cm 3.6 cm  Wound Width (cm) 1.5 cm 1.3 cm  Wound Depth (cm) 0.2 cm 0.2 cm  Wound Surface Area (cm^2) 5.25 cm^2 4.68 cm^2  Wound Volume (cm^3) 1.05 cm^3 0.94 cm^3  Margins Attached edges (approximated) --  Drainage Amount None --  Treatment Cleansed;Off loading --     No Linked orders to display    Discharge Exam:   Vitals:   07/05/20 2100 07/06/20 0003 07/06/20 1250 07/06/20 1454  BP: (!) 120/91 123/64 (!) 147/72 132/71   Pulse: (!) 117 (!) 111 (!) 114 (!) 101  Resp:  '19 20 16  ' Temp:  98.6 F (37 C)  98 F (36.7 C)  TempSrc:  Oral  Oral  SpO2: 100% 97% 97% 94%  Weight:  69.3 kg    Height:  '5\' 8"'  (1.727 m)      Body mass index is 23.23 kg/m.  General exam: Comfort care status.  Pleasant I did not do detailed examination because of comfort care status.  Follow ups:   Discharge Instructions    Diet general   Complete by: As directed    Increase activity slowly   Complete by: As directed       Follow-up Information    [facility] Follow up.   Contact information: [address] [place] [address] [phone]              Recommendations for Outpatient Follow-Up:   1. Per hospice policy  Discharge Instructions:  Follow with Primary MD Fayrene Helper, MD in 7 days  For hospice policy  Allergies as of 07/07/2020   No Known Allergies     Medication List    STOP taking these medications   amLODipine 5 MG tablet Commonly known as: NORVASC   Centrum Silver Ultra Womens Tabs   cloNIDine 0.3 MG tablet Commonly known as: CATAPRES   dexamethasone 1 MG tablet Commonly known as: DECADRON   lidocaine 5 % ointment Commonly known as: XYLOCAINE   metoprolol tartrate 25 MG tablet Commonly known as: LOPRESSOR   ondansetron 8 MG tablet Commonly known as: Zofran   potassium chloride 10 MEQ tablet Commonly known as: KLOR-CON   pravastatin 10 MG tablet Commonly known as: PRAVACHOL   temozolomide 180 MG capsule Commonly known as: TEMODAR   UNABLE TO FIND     TAKE these medications   acetaminophen 325 MG tablet Commonly known as: TYLENOL Take 2 tablets (650 mg total) by mouth every 4 (four) hours as needed for mild pain (temp > 100.5). What changed:   how much to take  when to take this  reasons to take this   alendronate 70 MG tablet Commonly known as: FOSAMAX TAKE 1 TABLET (70 MG TOTAL) BY MOUTH EVERY 7  (SEVEN) DAYS. TAKE WITH A FULL GLASS OF WATER ON AN EMPTY STOMACH. What changed: See the new instructions.   Calcium 1200 1200-1000 MG-UNIT Chew Chew 1,200 mg by mouth daily.   CALCIUM 600/VITAMIN D PO Take 1,200 Units by mouth at bedtime.   Cyanocobalamin 2500 MCG Tabs Take 2,500 mcg by mouth daily.   gabapentin 600 MG tablet Commonly known as: NEURONTIN Take 600 mg by mouth 3 (three) times daily.   gabapentin 300 MG capsule Commonly known as: NEURONTIN Take 2 capsules (600 mg total) by mouth 3 (three) times daily.   glycopyrrolate 1 MG tablet Commonly known as: ROBINUL Take 1 tablet (1 mg total) by mouth every 4 (four) hours as needed (excessive secretions).   haloperidol 0.5 MG tablet Commonly known as: HALDOL Take 1 tablet (0.5 mg total) by mouth every 4 (four) hours as needed for agitation (or delirium).   levETIRAcetam 1000 MG tablet  Commonly known as: KEPPRA Take 1 tablet (1,000 mg total) by mouth 2 (two) times daily.   ondansetron 4 MG disintegrating tablet Commonly known as: ZOFRAN-ODT Take 1 tablet (4 mg total) by mouth every 6 (six) hours as needed for nausea.       Time coordinating discharge: 25 minutes  The results of significant diagnostics from this hospitalization (including imaging, microbiology, ancillary and laboratory) are listed below for reference.    Procedures and Diagnostic Studies:   MR Brain W and Wo Contrast  Result Date: 07/05/2020 CLINICAL DATA:  Altered mental status, glioblastoma EXAM: MRI HEAD WITHOUT AND WITH CONTRAST TECHNIQUE: Multiplanar, multiecho pulse sequences of the brain and surrounding structures were obtained without and with intravenous contrast. CONTRAST:  96m GADAVIST GADOBUTROL 1 MMOL/ML IV SOLN COMPARISON:  May 14, 2020 FINDINGS: Postcontrast imaging is motion degraded. Brain: There is increased enhancement about the left parietal resection cavity extending contralaterally across the splenium of the corpus callosum.  Nodular enhancement near midline is again identified and is likely similar. There is also a new enhancement along the right frontal horn. Extent of abnormal T2 FLAIR hyperintensity has increased in these areas and adjacent white matter for example within the left occipitotemporal lobes. There is nonspecific linear diffusion hyperintensity without restriction at the right dorsal aspect of the medulla extending into the brachium pontis. May reflect sequelae of interval infarction and/or wallerian degeneration. There is no significant mass effect.  No hydrocephalus. Vascular: Major vessel flow voids at the skull base are preserved. Skull and upper cervical spine: Normal marrow signal. Left parietal craniotomy. Sinuses/Orbits: Increased lobular right sphenoid sinus mucosal thickening. Otherwise minor paranasal sinus mucosal thickening. Left lens replacement. Other: Sella is unremarkable.  Increased right mastoid effusion. IMPRESSION: Suboptimal valuation. In particular, postcontrast imaging is significantly motion degraded. Progression of enhancement and abnormal T2 FLAIR hyperintensity. Given discontiguous involvement along the right frontal horn, progression of disease is likely. Electronically Signed   By: PMacy MisM.D.   On: 07/05/2020 14:32     Labs:   Basic Metabolic Panel: Recent Labs  Lab 07/05/20 1018 07/05/20 1227  NA 140 141  K 4.4 4.5  CL 103 101  CO2 27 28  GLUCOSE 109* 115*  BUN 17 21  CREATININE 0.80 0.89  CALCIUM 9.6 9.8   GFR Estimated Creatinine Clearance: 57.6 mL/min (by C-G formula based on SCr of 0.89 mg/dL). Liver Function Tests: Recent Labs  Lab 07/05/20 1018 07/05/20 1227  AST 21 29  ALT 25 26  ALKPHOS 70 67  BILITOT 0.5 0.8  PROT 7.1 8.1  ALBUMIN 3.3* 3.8   No results for input(s): LIPASE, AMYLASE in the last 168 hours. No results for input(s): AMMONIA in the last 168 hours. Coagulation profile No results for input(s): INR, PROTIME in the last 168  hours.  CBC: Recent Labs  Lab 07/05/20 1018 07/05/20 1227  WBC 7.0 8.1  NEUTROABS 3.6 3.4  HGB 11.3* 11.6*  HCT 36.8 37.8  MCV 98.4 99.7  PLT 237 230   Cardiac Enzymes: No results for input(s): CKTOTAL, CKMB, CKMBINDEX, TROPONINI in the last 168 hours. BNP: Invalid input(s): POCBNP CBG: Recent Labs  Lab 07/05/20 1216  GLUCAP 87   D-Dimer No results for input(s): DDIMER in the last 72 hours. Hgb A1c No results for input(s): HGBA1C in the last 72 hours. Lipid Profile No results for input(s): CHOL, HDL, LDLCALC, TRIG, CHOLHDL, LDLDIRECT in the last 72 hours. Thyroid function studies No results for input(s): TSH, T4TOTAL, T3FREE, THYROIDAB  in the last 72 hours.  Invalid input(s): FREET3 Anemia work up No results for input(s): VITAMINB12, FOLATE, FERRITIN, TIBC, IRON, RETICCTPCT in the last 72 hours. Microbiology Recent Results (from the past 240 hour(s))  SARS Coronavirus 2 by RT PCR (hospital order, performed in [facility] hospital lab) Nasopharyngeal Nasopharyngeal Swab     Status: Abnormal   Collection Time: 07/05/20  1:09 PM   Specimen: Nasopharyngeal Swab  Result Value Ref Range Status   SARS Coronavirus 2 POSITIVE (A) NEGATIVE Final    Comment: RESULT CALLED TO, READ BACK BY AND VERIFIED WITH: DOWD,P. RN '@[id number]'  ON 02.07.2022 BY COHEN,K (NOTE) SARS-CoV-2 target nucleic acids are DETECTED  SARS-CoV-2 RNA is generally detectable in upper respiratory specimens  during the acute phase of infection.  Positive results are indicative  of the presence of the identified virus, but do not rule out bacterial infection or co-infection with other pathogens not detected by the test.  Clinical correlation with patient history and  other diagnostic information is necessary to determine patient infection status.  The expected result is negative.  Fact Sheet for Patients:   StrictlyIdeas.no   Fact Sheet for Healthcare Providers:    BankingDealers.co.za    This test is not yet approved or cleared by the [place] FDA and  has been authorized for detection and/or diagnosis of SARS-CoV-2 by FDA under an Emergency Use Authorization (EUA).  This EUA will remain in effect (meaning  this test can be used) for the duration of  the COVID-19 declaration under Section 564(b)(1) of the Act, 21 U.S.C. section 360-bbb-3(b)(1), unless the authorization is terminated or revoked sooner.  Performed at [facility], [place] [address], [place], [place] [address]      Signed: Marlowe Aschoff Lakendra Helling  [facility] 07/07/2020, 12:32 PM

## 2020-07-07 NOTE — Progress Notes (Signed)
Patient will be discharging via [facility] to [facility]. Will call report.

## 2020-07-07 NOTE — Progress Notes (Signed)
Spoke with English as a second language teacher at [facility] and gave report.

## 2020-07-07 NOTE — Plan of Care (Signed)

## 2020-07-07 NOTE — Progress Notes (Signed)
   Conference call with pt's family 2 children [name] and Saralyn Pilar. Discussed Hospice care at our inpatient facility. They are in agreement with comfort approach.  No bed available at hospice home in [facility] but did have an available bed on COVID unit in [facility]. Family agreed and accepted the [place] bed. Our MD approved the pt and the daughter [name] signed paperwork via doc u sign agreement due to none of the children live locally.  Barbaraann Rondo updated and will arrange ambulance after getting MD orders for discharge.    Webb Silversmith RN [phone]

## 2020-07-07 NOTE — Progress Notes (Signed)
Nutrition Brief Note  Patient identified to be seen for Malnutrition Screening Tool (MST). Chart reviewed. Patient now transitioning to comfort care.   No nutrition interventions warranted at this time. Please consult RD as needed.   Jacklynn Barnacle, MS, RD, LDN Pager number available on Amion

## 2020-07-07 NOTE — TOC Transition Note (Signed)
Transition of Care [facility]) - CM/SW Discharge Note   Patient Details  Name: Wanda Ross MRN: [id number] Date of Birth: Jul 19, 1950  [facility] CM/SW Contact:  Trish Mage, LCSW Phone Number: 07/07/2020, 2:14 PM   Clinical Narrative:  Patient who is stable for d/c is to transition to [facility] facility in [place].  PTAR arranged.  Nursing, please call report to [phone]. TOC sign off    Final next level of care: [place] Barriers to Discharge: Barriers Resolved   Patient Goals and CMS Choice     Choice offered to / list presented to : Adult Children  Discharge Placement                       Discharge Plan and Services In-house Referral: Clinical Social Work   Post Acute Care Choice: Hospice                               Social Determinants of Health (SDOH) Interventions     Readmission Risk Interventions Readmission Risk Prevention Plan 06/16/2020 05/24/2020  Transportation Screening Complete Complete  PCP or Specialist Appt within 3-5 Days - Complete  HRI or [place] - Complete  Social Work Consult for [place] Planning/Counseling - Complete  Palliative Care Screening - Complete  Medication Review Press photographer) Complete Complete  HRI or Home Care Consult Complete -  SW Recovery Care/Counseling Consult Complete -  Palliative Care Screening Not Applicable -  Some recent data might be hidden

## 2020-07-07 NOTE — Progress Notes (Signed)
Patient picked up by [facility] via stretcher for transport to Hospice in [place], [place]. IV removed from [facility]. VS updated in chart. Family notified as requested when patient is leaving the hospital.

## 2020-07-13 ENCOUNTER — Telehealth: Payer: Self-pay

## 2020-07-20 ENCOUNTER — Other Ambulatory Visit: Payer: Medicare PPO

## 2020-07-20 ENCOUNTER — Ambulatory Visit: Payer: Medicare PPO | Admitting: Internal Medicine

## 2020-07-27 NOTE — Telephone Encounter (Signed)
Misty, SW from [facility] called stating that Ms Barnett died 08-08-20 at [facility] am. Message forwarded to Dr Mickeal Skinner.

## 2020-07-27 DEATH — deceased

## 2021-02-01 IMAGING — CT CT ANGIO CHEST
2 of 6 series · 18 of 46 positions shown · IV contrast (Omnipaque or Isovue)
Comparison: Chest x-ray 06/15/2020, CT chest 11/23/2019

CLINICAL DATA: Shortness of breath and febrile

EXAM:
CT ANGIOGRAPHY CHEST WITH CONTRAST
TECHNIQUE: Multidetector CT imaging of the chest was performed using the
standard protocol during bolus administration of intravenous
contrast. Multiplanar CT image reconstructions and MIPs were
obtained to evaluate the vascular anatomy.
CONTRAST:  100mL OMNIPAQUE IOHEXOL 350 MG/ML SOLN

[Series 9: pe axial thins · axial · 0.61mm/px · z∈[-347,-96]mm · 15 of 275 slices shown]
[im 12/275  lung]
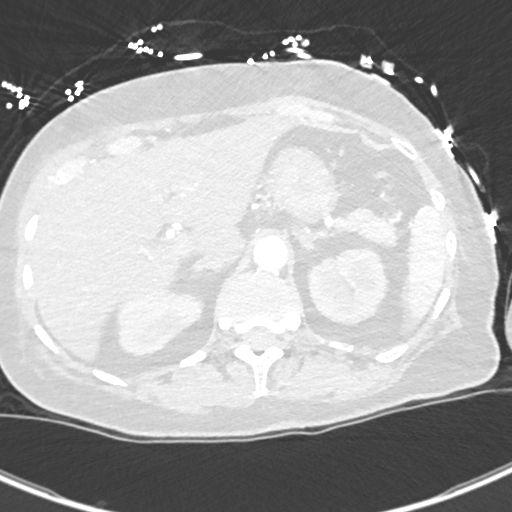
[im 36/275  soft-tissue]
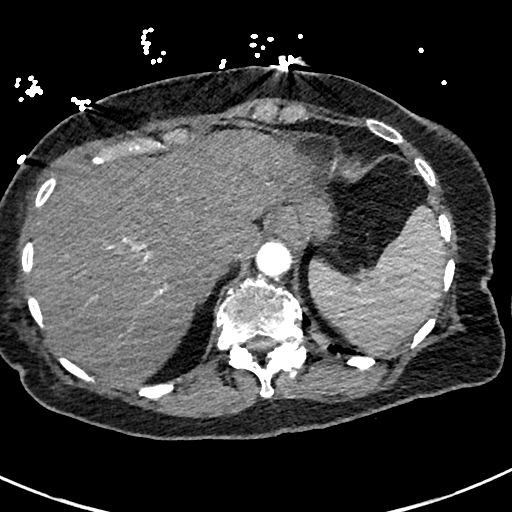
[im 48/275  lung]
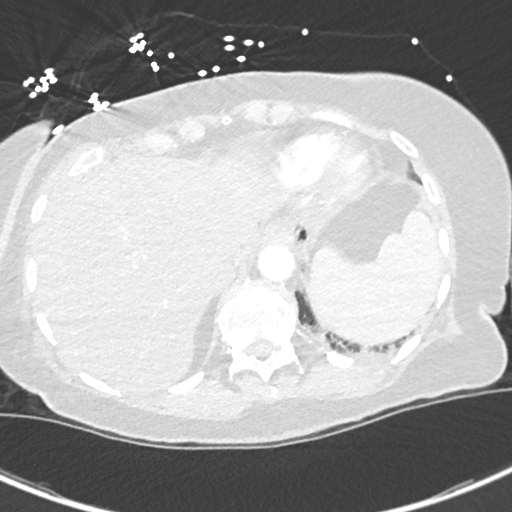
[im 72/275  soft-tissue]
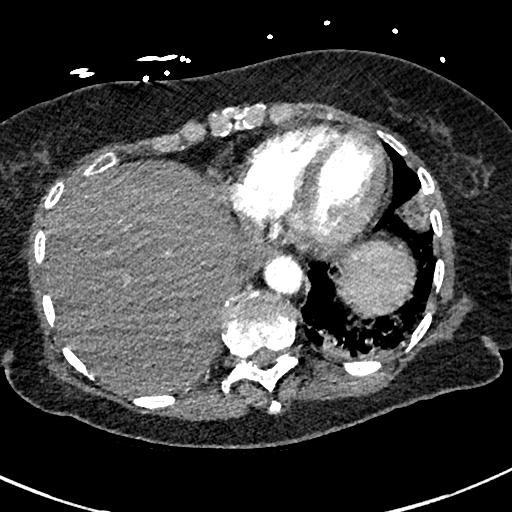
[im 84/275  lung]
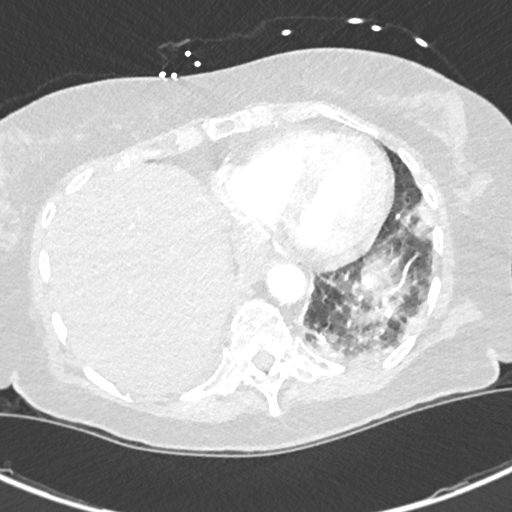
[im 108/275  soft-tissue]
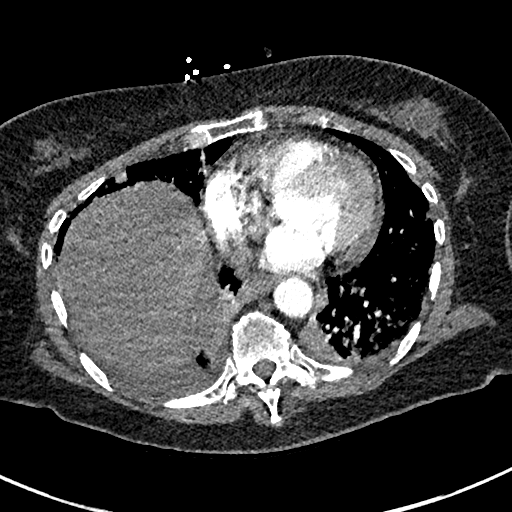
[im 120/275  lung]
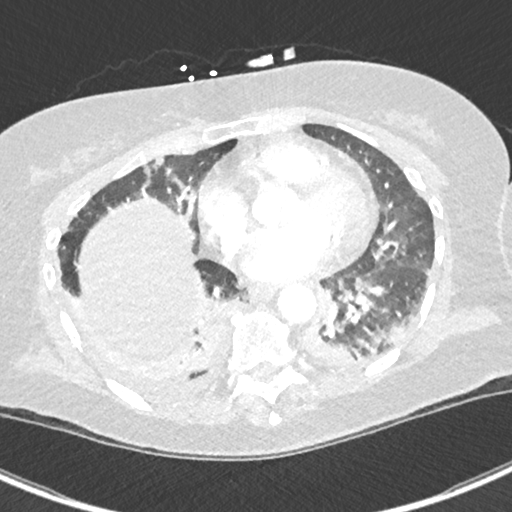
[im 143/275  soft-tissue]
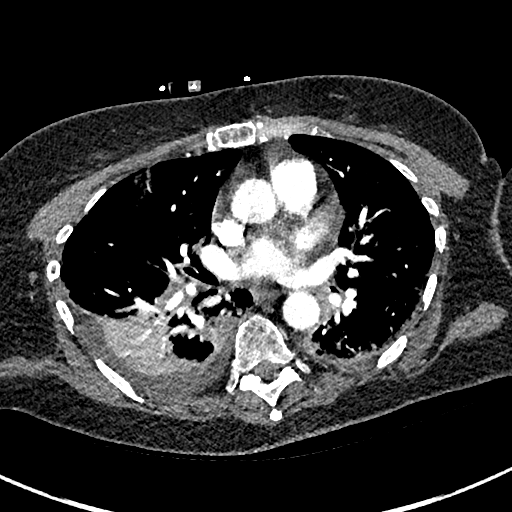
[im 155/275  lung]
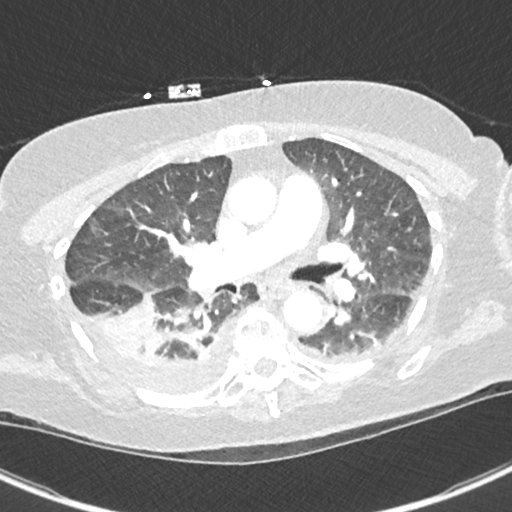
[im 167/275  soft-tissue]
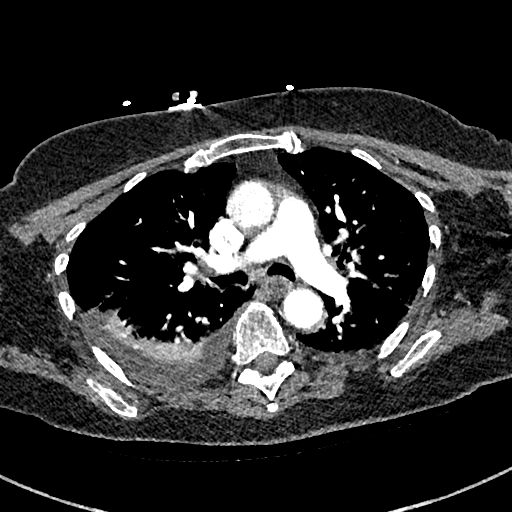
[im 191/275  lung]
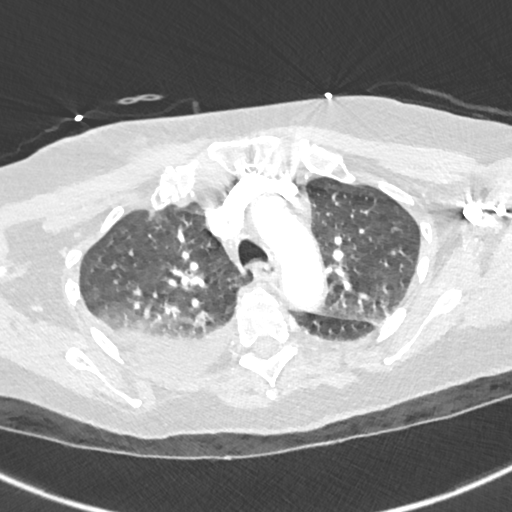
[im 203/275  soft-tissue]
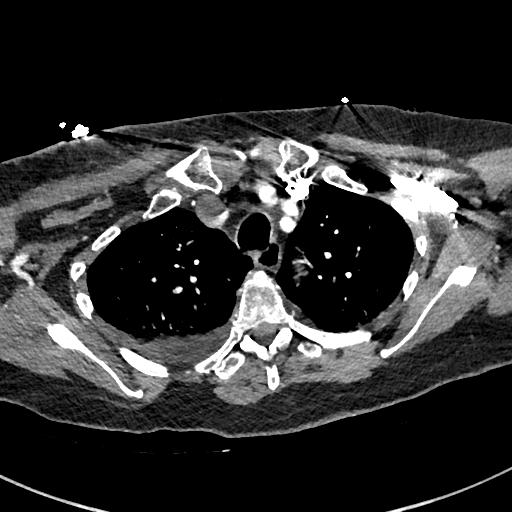
[im 227/275  lung]
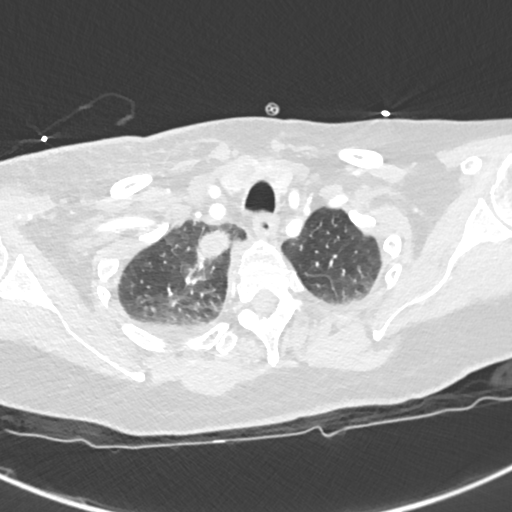
[im 239/275  soft-tissue]
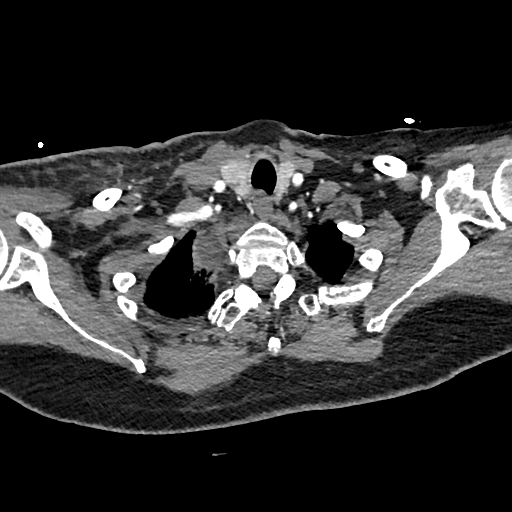
[im 263/275  lung]
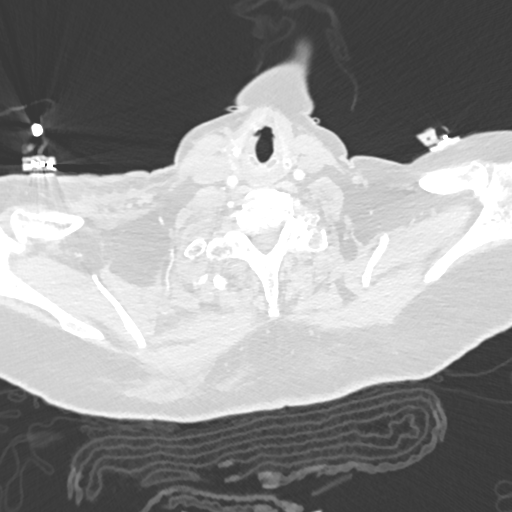

[Series 11: cor soft · coronal · 0.56mm/px · 3 of 112 slices shown]
[im 28/112  soft-tissue]
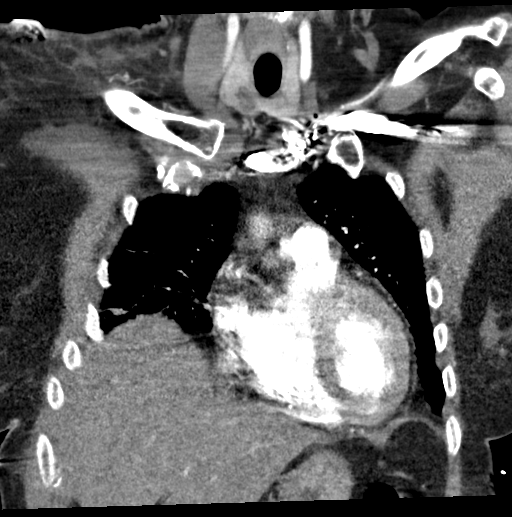
[im 56/112  soft-tissue]
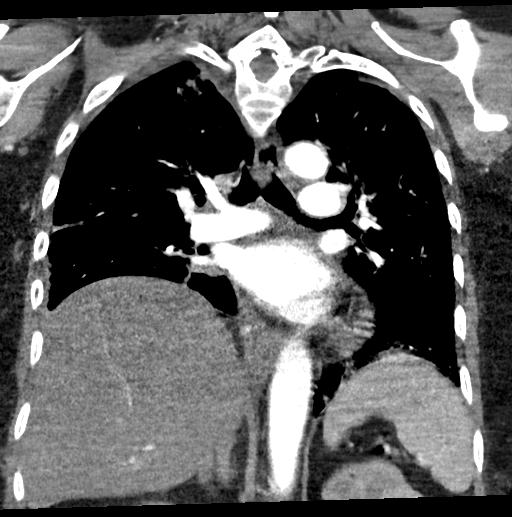
[im 84/112  soft-tissue]
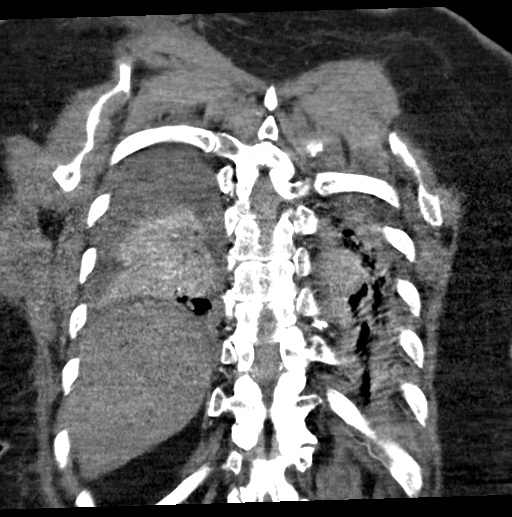

[18 of 46 positions shown; findings below may reference images not displayed]

FINDINGS: Cardiovascular: Satisfactory opacification of the pulmonary arteries
to the segmental level. No evidence of pulmonary embolism.
Nonaneurysmal aorta. No dissection is seen. Normal cardiac size. No
significant pericardial effusion.

Mediastinum/Nodes: 1.1 cm hypodense nodule in the right lobe of the
thyroid, no further workup recommended based on size of lesion and
age of patient. Midline trachea. No suspicious adenopathy. Esophagus
within normal limits

Lungs/Pleura: Small right-sided pleural effusion. Right medial
apical ovoid hypodensity measuring 2.4 cm is of fluid density and
may represent a small amount of loculated fluid at the right apex.
Hazy bilateral lung densities. Partial consolidation in the right
greater than left lower lobes. Small foci of consolidation and
ground-glass density in the lingula. No pneumothorax.

Upper Abdomen: Stable small hyperenhancing focus in the left hepatic
lobe. No acute abnormality.

Musculoskeletal: Degenerative changes of the spine. No acute or
suspicious osseous abnormality.

Review of the MIP images confirms the above findings.
IMPRESSION: 1. Negative for acute pulmonary embolus or aortic dissection.
2. Small right-sided pleural effusion. Partial consolidation in the
right greater than left lower lobes with additional small foci of
consolidation and ground-glass density in the lingula, possible
bilateral pneumonia.
3. 2.4 cm right medial apical hypodensity of fluid attenuation,
possibly representing small loculated apical fluid. Attention on
follow-up imaging.

## 2021-02-01 IMAGING — CT CT HEAD W/O CM
3 series · 15 of 47 positions shown, 18 images · non-contrast
Comparison: Prior brain MRI examinations 05/14/2020 and earlier.
Prior head CT examinations 05/14/2020 and earlier.

CLINICAL DATA: Delirium. Additional history provided: Patient with
shortness of breath, febrile, confusion, recently diagnosed with
UTI. Additional history obtained from electronic medical record:
History of glioblastoma multiform a and seizures.

EXAM:
CT HEAD WITHOUT CONTRAST
TECHNIQUE: Contiguous axial images were obtained from the base of the skull
through the vertex without intravenous contrast.

[Series 2: head w o · axial · 0.40mm/px · z∈[-2,+128]mm · 9 of 32 slices shown, 12 images]
[im 3/32  brain]
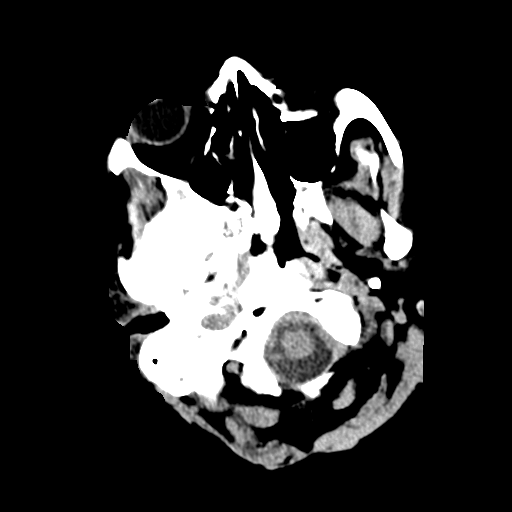
[im 3/32  bone]
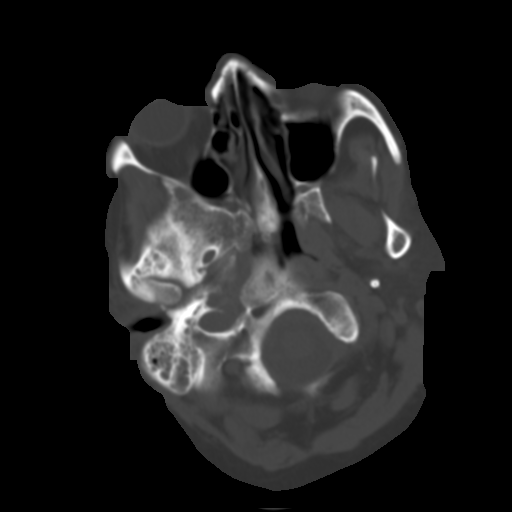
[im 6/32  brain]
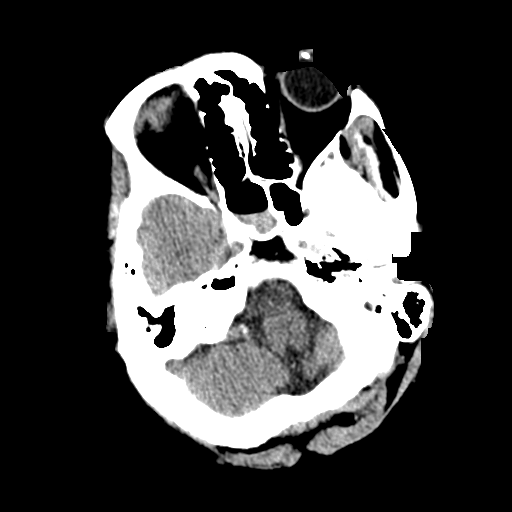
[im 9/32  brain]
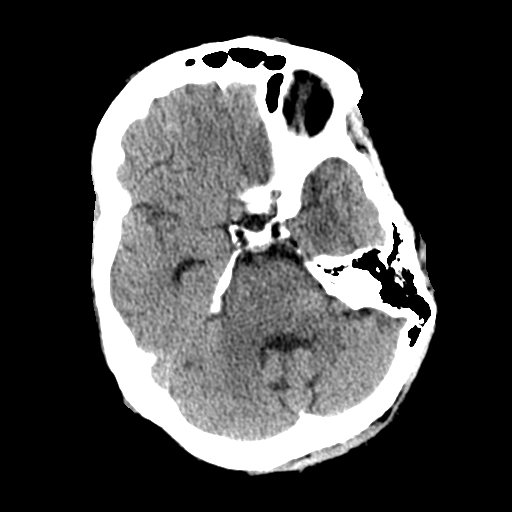
[im 12/32  brain]
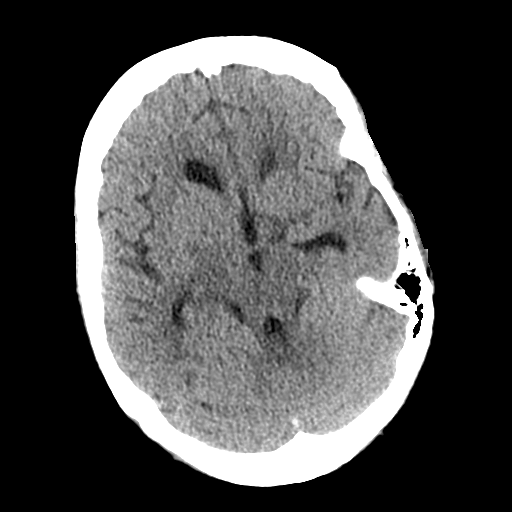
[im 17/32  brain]
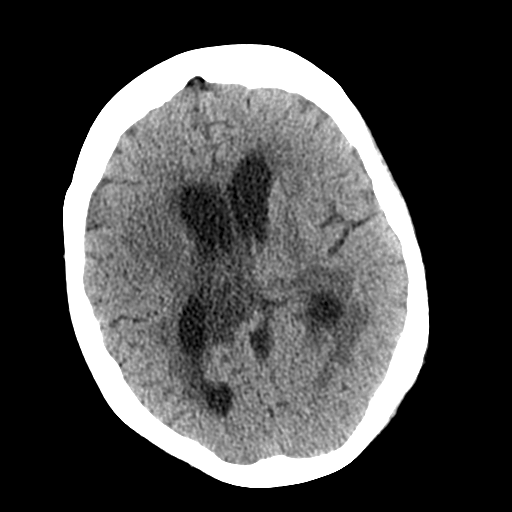
[im 17/32  bone]
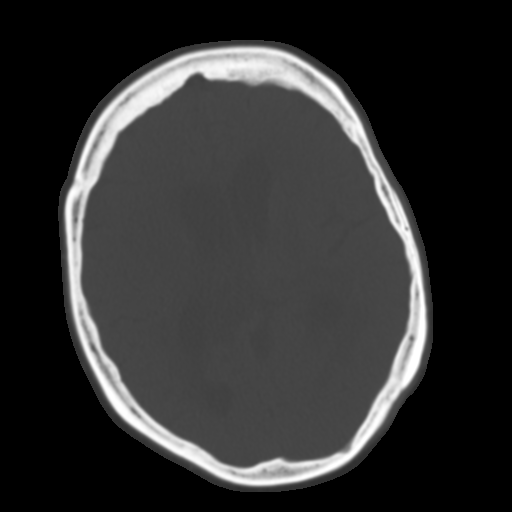
[im 20/32  brain]
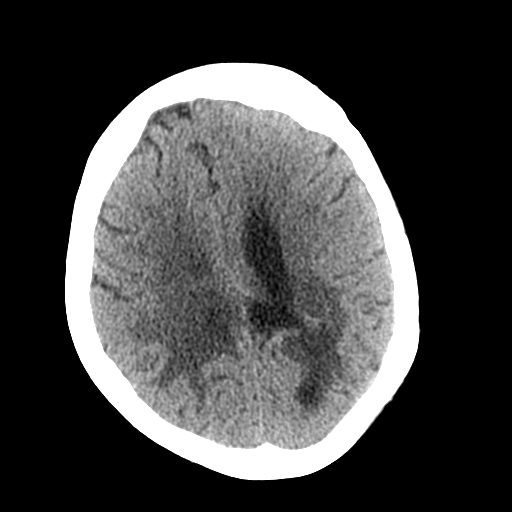
[im 23/32  brain]
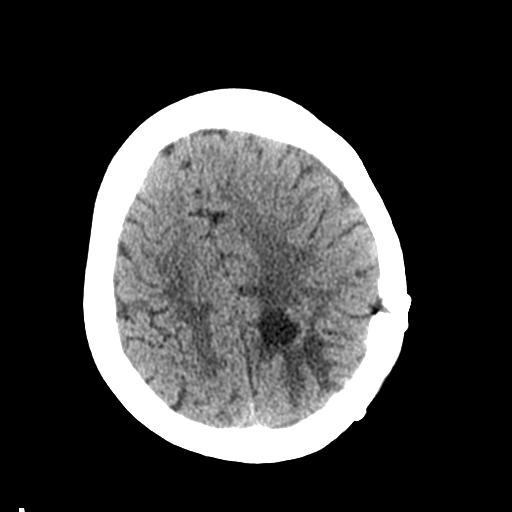
[im 26/32  brain]
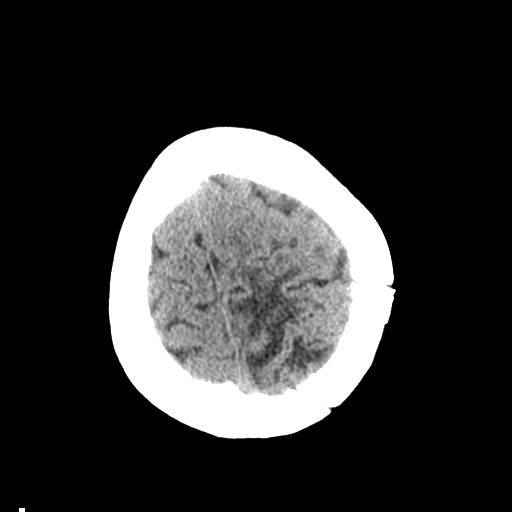
[im 29/32  brain]
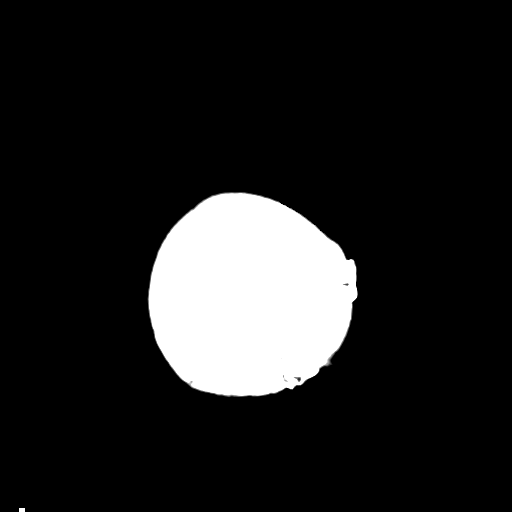
[im 29/32  bone]
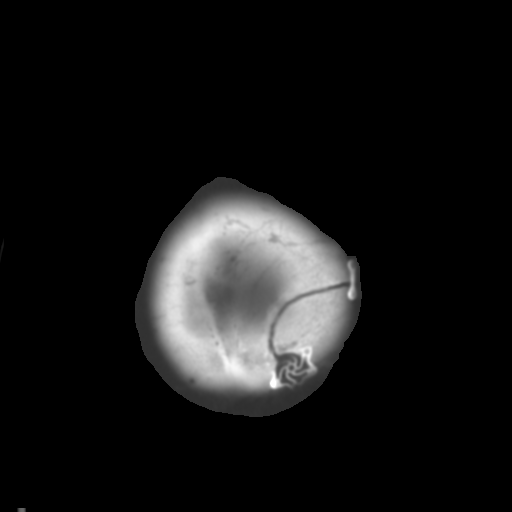

[Series 4: coronal soft · coronal · 0.34mm/px · 3 of 66 slices shown]
[im 22/66  brain]
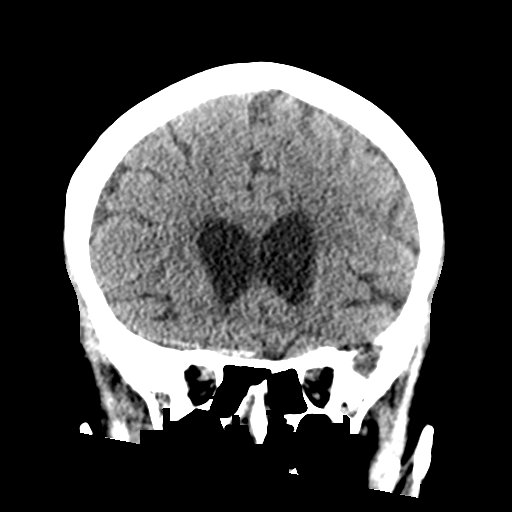
[im 29/66  brain]
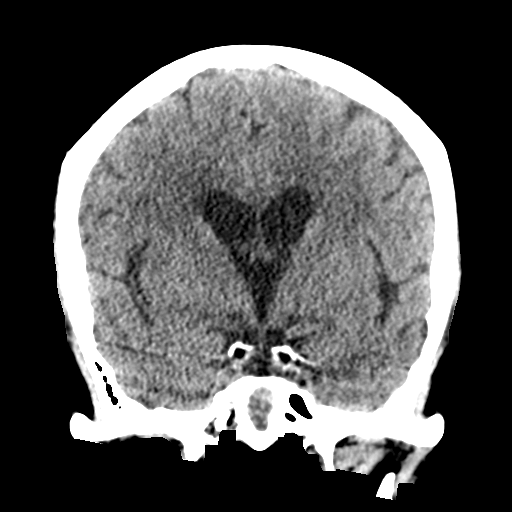
[im 37/66  brain]
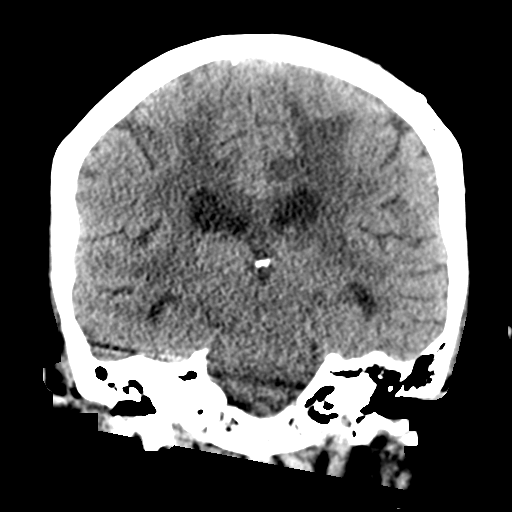

[Series 5: sagittal soft · sagittal · 0.34mm/px · 3 of 57 slices shown]
[im 23/57  brain]
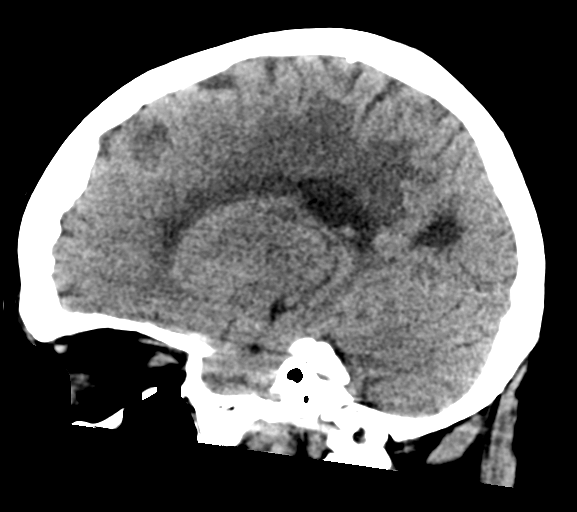
[im 29/57  brain]
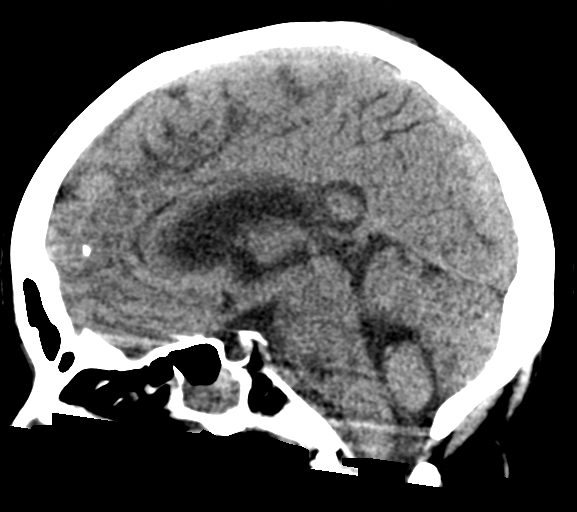
[im 34/57  brain]
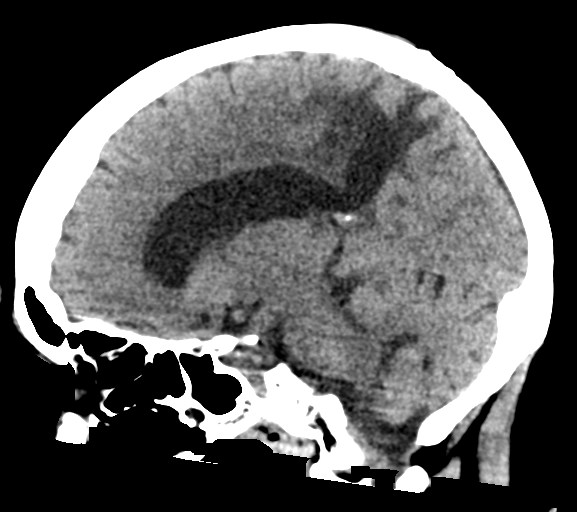

[15 of 47 positions shown; findings below may reference images not displayed]

FINDINGS: Brain:

Redemonstrated resection cavity within the left parietal lobe and
callosal splenium. As before, there is white matter hypoattenuation
surrounding the resection cavity, within the callosal splenium and
within the posterior cerebral hemispheres bilaterally. This
corresponds with the T2/FLAIR hyperintense signal abnormality on the
prior brain MRI of 05/14/2020. There is a subtly hyperdense 10 mm
nodular focus just to the right of midline along the callosal
splenium. This corresponds with the enhancing nodule demonstrated on
the prior MRI.

Mild ill-defined hypoattenuation elsewhere within the cerebral white
matter is nonspecific, but compatible with chronic small vessel
ischemic disease.

There is no evidence of acute intracranial hemorrhage. No acute
demarcated cortical infarct.

No extra-axial fluid collection.

No midline shift.

Vascular: No hyperdense vessel.

Skull: Prior left parietal craniotomy/cranioplasty. No calvarial
fracture.

Sinuses/Orbits: Visualized orbits show no acute finding. Air-fluid
level within the right sphenoid sinus. Mild bilateral ethmoid and
left sphenoid sinus mucosal thickening.

Other: Right mastoid effusion.
IMPRESSION: No evidence of acute infarction or acute intracranial hemorrhage.

Redemonstrated surgical cavity within the left parietal lobe and
callosal splenium at site of prior brain tumor resection. There is
nonspecific matter hypoattenuation surrounding the resection cavity,
within the callosal splenium and within the posterior cerebral
hemispheres bilaterally. This corresponds with the T2/FLAIR
hyperintense signal abnormality at these sites on the prior brain
MRI of 05/14/2020. A 10 mm subtly hyperdense focus is present just
to the right of midline along the callosal splenium. This likely
reflects the nodular enhancing focus demonstrated on the prior MRI
and this is suspicious for residual viable tumor.

Stable background mild cerebral white matter chronic small vessel
ischemic disease.

Paranasal sinus disease as described. Correlate for acute sinusitis.

Right mastoid effusion.
